# Patient Record
Sex: Female | Born: 1945 | Race: Black or African American | Hispanic: No | Smoking: Never smoker
Health system: Southern US, Community
[De-identification: ages and names within clinical notes are randomized; demographics above are authoritative.]

## PROBLEM LIST (undated history)

## (undated) DIAGNOSIS — I10 Essential (primary) hypertension: Secondary | ICD-10-CM

## (undated) DIAGNOSIS — M170 Bilateral primary osteoarthritis of knee: Secondary | ICD-10-CM

## (undated) DIAGNOSIS — F32A Depression, unspecified: Secondary | ICD-10-CM

## (undated) DIAGNOSIS — I272 Pulmonary hypertension, unspecified: Secondary | ICD-10-CM

## (undated) DIAGNOSIS — M549 Dorsalgia, unspecified: Secondary | ICD-10-CM

## (undated) DIAGNOSIS — K219 Gastro-esophageal reflux disease without esophagitis: Secondary | ICD-10-CM

## (undated) DIAGNOSIS — E785 Hyperlipidemia, unspecified: Secondary | ICD-10-CM

## (undated) DIAGNOSIS — K5730 Diverticulosis of large intestine without perforation or abscess without bleeding: Secondary | ICD-10-CM

## (undated) DIAGNOSIS — G4730 Sleep apnea, unspecified: Secondary | ICD-10-CM

## (undated) DIAGNOSIS — C801 Malignant (primary) neoplasm, unspecified: Secondary | ICD-10-CM

## (undated) DIAGNOSIS — E669 Obesity, unspecified: Secondary | ICD-10-CM

## (undated) DIAGNOSIS — IMO0002 Reserved for concepts with insufficient information to code with codable children: Secondary | ICD-10-CM

## (undated) DIAGNOSIS — M1710 Unilateral primary osteoarthritis, unspecified knee: Secondary | ICD-10-CM

## (undated) DIAGNOSIS — F329 Major depressive disorder, single episode, unspecified: Secondary | ICD-10-CM

## (undated) HISTORY — DX: Depression, unspecified: F32.A

## (undated) HISTORY — DX: Bilateral primary osteoarthritis of knee: M17.0

## (undated) HISTORY — DX: Hyperlipidemia, unspecified: E78.5

## (undated) HISTORY — PX: ABDOMINAL HYSTERECTOMY: SHX81

## (undated) HISTORY — DX: Obesity, unspecified: E66.9

## (undated) HISTORY — PX: TUBAL LIGATION: SHX77

## (undated) HISTORY — DX: Pulmonary hypertension, unspecified: I27.20

## (undated) HISTORY — DX: Dorsalgia, unspecified: M54.9

## (undated) HISTORY — DX: Gastro-esophageal reflux disease without esophagitis: K21.9

## (undated) HISTORY — DX: Major depressive disorder, single episode, unspecified: F32.9

## (undated) HISTORY — DX: Diverticulosis of large intestine without perforation or abscess without bleeding: K57.30

## (undated) HISTORY — DX: Reserved for concepts with insufficient information to code with codable children: IMO0002

## (undated) HISTORY — DX: Essential (primary) hypertension: I10

## (undated) HISTORY — DX: Unilateral primary osteoarthritis, unspecified knee: M17.10

---

## 2001-06-11 ENCOUNTER — Encounter: Payer: Self-pay | Admitting: Internal Medicine

## 2001-06-11 ENCOUNTER — Ambulatory Visit (HOSPITAL_COMMUNITY): Admission: RE | Admit: 2001-06-11 | Discharge: 2001-06-11 | Payer: Self-pay | Admitting: Internal Medicine

## 2002-07-07 ENCOUNTER — Encounter: Payer: Self-pay | Admitting: Internal Medicine

## 2002-07-07 ENCOUNTER — Ambulatory Visit (HOSPITAL_COMMUNITY): Admission: RE | Admit: 2002-07-07 | Discharge: 2002-07-07 | Payer: Self-pay | Admitting: Internal Medicine

## 2003-02-21 ENCOUNTER — Ambulatory Visit: Admission: RE | Admit: 2003-02-21 | Discharge: 2003-02-21 | Payer: Self-pay | Admitting: Orthopedic Surgery

## 2003-02-21 ENCOUNTER — Encounter: Payer: Self-pay | Admitting: Orthopedic Surgery

## 2003-07-11 ENCOUNTER — Ambulatory Visit (HOSPITAL_COMMUNITY): Admission: RE | Admit: 2003-07-11 | Discharge: 2003-07-11 | Payer: Self-pay | Admitting: Family Medicine

## 2003-07-23 HISTORY — PX: COLONOSCOPY: SHX174

## 2003-08-25 ENCOUNTER — Ambulatory Visit (HOSPITAL_COMMUNITY): Admission: RE | Admit: 2003-08-25 | Discharge: 2003-08-25 | Payer: Self-pay | Admitting: General Surgery

## 2003-08-25 LAB — HM COLONOSCOPY: HM Colonoscopy: NORMAL

## 2003-11-28 ENCOUNTER — Ambulatory Visit (HOSPITAL_COMMUNITY): Admission: RE | Admit: 2003-11-28 | Discharge: 2003-11-28 | Payer: Self-pay | Admitting: Family Medicine

## 2003-11-29 ENCOUNTER — Inpatient Hospital Stay (HOSPITAL_COMMUNITY): Admission: RE | Admit: 2003-11-29 | Discharge: 2003-12-05 | Payer: Self-pay | Admitting: Orthopedic Surgery

## 2003-11-29 ENCOUNTER — Encounter: Payer: Self-pay | Admitting: Orthopedic Surgery

## 2003-11-29 HISTORY — PX: TOTAL KNEE ARTHROPLASTY: SHX125

## 2004-04-18 ENCOUNTER — Ambulatory Visit (HOSPITAL_COMMUNITY): Admission: RE | Admit: 2004-04-18 | Discharge: 2004-04-18 | Payer: Self-pay | Admitting: *Deleted

## 2004-06-01 ENCOUNTER — Ambulatory Visit: Payer: Self-pay | Admitting: Orthopedic Surgery

## 2004-06-01 ENCOUNTER — Inpatient Hospital Stay (HOSPITAL_COMMUNITY): Admission: RE | Admit: 2004-06-01 | Discharge: 2004-06-06 | Payer: Self-pay | Admitting: Orthopedic Surgery

## 2004-06-01 HISTORY — PX: TOTAL KNEE ARTHROPLASTY: SHX125

## 2004-06-13 ENCOUNTER — Ambulatory Visit: Payer: Self-pay | Admitting: Orthopedic Surgery

## 2004-06-20 ENCOUNTER — Ambulatory Visit: Payer: Self-pay | Admitting: Family Medicine

## 2004-07-11 ENCOUNTER — Ambulatory Visit: Payer: Self-pay | Admitting: Orthopedic Surgery

## 2004-08-07 ENCOUNTER — Ambulatory Visit (HOSPITAL_COMMUNITY): Admission: RE | Admit: 2004-08-07 | Discharge: 2004-08-07 | Payer: Self-pay | Admitting: Family Medicine

## 2004-08-14 ENCOUNTER — Ambulatory Visit: Payer: Self-pay | Admitting: Family Medicine

## 2004-08-16 ENCOUNTER — Ambulatory Visit: Payer: Self-pay | Admitting: Orthopedic Surgery

## 2004-09-17 ENCOUNTER — Ambulatory Visit: Payer: Self-pay | Admitting: Orthopedic Surgery

## 2004-09-25 ENCOUNTER — Ambulatory Visit: Payer: Self-pay | Admitting: Family Medicine

## 2004-11-22 ENCOUNTER — Ambulatory Visit: Payer: Self-pay | Admitting: Family Medicine

## 2004-12-19 ENCOUNTER — Ambulatory Visit: Payer: Self-pay | Admitting: Orthopedic Surgery

## 2005-03-28 ENCOUNTER — Ambulatory Visit: Payer: Self-pay | Admitting: Family Medicine

## 2005-05-07 ENCOUNTER — Ambulatory Visit: Payer: Self-pay | Admitting: Family Medicine

## 2005-06-03 ENCOUNTER — Ambulatory Visit: Payer: Self-pay | Admitting: Orthopedic Surgery

## 2005-06-03 ENCOUNTER — Ambulatory Visit: Payer: Self-pay | Admitting: Family Medicine

## 2005-06-10 ENCOUNTER — Ambulatory Visit: Payer: Self-pay | Admitting: Family Medicine

## 2005-07-10 ENCOUNTER — Ambulatory Visit: Payer: Self-pay | Admitting: Family Medicine

## 2005-07-19 ENCOUNTER — Emergency Department (HOSPITAL_COMMUNITY): Admission: EM | Admit: 2005-07-19 | Discharge: 2005-07-19 | Payer: Self-pay | Admitting: Emergency Medicine

## 2005-07-19 ENCOUNTER — Ambulatory Visit: Payer: Self-pay | Admitting: Family Medicine

## 2005-07-26 ENCOUNTER — Ambulatory Visit: Payer: Self-pay | Admitting: Family Medicine

## 2005-08-02 ENCOUNTER — Ambulatory Visit: Payer: Self-pay | Admitting: Family Medicine

## 2005-08-15 ENCOUNTER — Ambulatory Visit (HOSPITAL_COMMUNITY): Admission: RE | Admit: 2005-08-15 | Discharge: 2005-08-15 | Payer: Self-pay | Admitting: Family Medicine

## 2005-09-10 ENCOUNTER — Ambulatory Visit: Payer: Self-pay | Admitting: Family Medicine

## 2005-11-06 ENCOUNTER — Ambulatory Visit: Payer: Self-pay | Admitting: Family Medicine

## 2005-12-02 ENCOUNTER — Ambulatory Visit: Payer: Self-pay | Admitting: Family Medicine

## 2006-02-03 ENCOUNTER — Ambulatory Visit: Payer: Self-pay | Admitting: Family Medicine

## 2006-02-04 ENCOUNTER — Ambulatory Visit (HOSPITAL_COMMUNITY): Admission: RE | Admit: 2006-02-04 | Discharge: 2006-02-04 | Payer: Self-pay | Admitting: Family Medicine

## 2006-02-05 ENCOUNTER — Encounter (HOSPITAL_COMMUNITY): Admission: RE | Admit: 2006-02-05 | Discharge: 2006-03-07 | Payer: Self-pay | Admitting: Family Medicine

## 2006-02-10 ENCOUNTER — Ambulatory Visit: Payer: Self-pay | Admitting: Orthopedic Surgery

## 2006-02-14 ENCOUNTER — Ambulatory Visit: Payer: Self-pay | Admitting: Family Medicine

## 2006-02-18 ENCOUNTER — Encounter (HOSPITAL_COMMUNITY): Admission: RE | Admit: 2006-02-18 | Discharge: 2006-03-20 | Payer: Self-pay | Admitting: Orthopedic Surgery

## 2006-02-18 ENCOUNTER — Ambulatory Visit (HOSPITAL_COMMUNITY): Admission: RE | Admit: 2006-02-18 | Discharge: 2006-02-18 | Payer: Self-pay | Admitting: Family Medicine

## 2006-02-20 ENCOUNTER — Ambulatory Visit (HOSPITAL_COMMUNITY): Admission: RE | Admit: 2006-02-20 | Discharge: 2006-02-20 | Payer: Self-pay | Admitting: Orthopedic Surgery

## 2006-03-05 ENCOUNTER — Ambulatory Visit: Payer: Self-pay | Admitting: Orthopedic Surgery

## 2006-03-21 ENCOUNTER — Ambulatory Visit: Payer: Self-pay | Admitting: Family Medicine

## 2006-03-25 ENCOUNTER — Encounter (HOSPITAL_COMMUNITY): Admission: RE | Admit: 2006-03-25 | Discharge: 2006-04-19 | Payer: Self-pay | Admitting: Orthopedic Surgery

## 2006-04-02 ENCOUNTER — Ambulatory Visit: Payer: Self-pay | Admitting: Orthopedic Surgery

## 2006-04-25 ENCOUNTER — Ambulatory Visit: Payer: Self-pay | Admitting: Family Medicine

## 2006-04-25 ENCOUNTER — Other Ambulatory Visit: Admission: RE | Admit: 2006-04-25 | Discharge: 2006-04-25 | Payer: Self-pay | Admitting: Family Medicine

## 2006-04-25 LAB — CONVERTED CEMR LAB: Pap Smear: NORMAL

## 2006-04-28 ENCOUNTER — Ambulatory Visit: Payer: Self-pay | Admitting: Orthopedic Surgery

## 2006-06-27 ENCOUNTER — Ambulatory Visit: Payer: Self-pay | Admitting: Family Medicine

## 2006-08-19 ENCOUNTER — Ambulatory Visit (HOSPITAL_COMMUNITY): Admission: RE | Admit: 2006-08-19 | Discharge: 2006-08-19 | Payer: Self-pay | Admitting: Family Medicine

## 2006-09-29 ENCOUNTER — Ambulatory Visit: Payer: Self-pay | Admitting: Family Medicine

## 2006-09-29 LAB — CONVERTED CEMR LAB
BUN: 11 mg/dL (ref 6–23)
Calcium: 9.3 mg/dL (ref 8.4–10.5)
Cholesterol: 227 mg/dL — ABNORMAL HIGH (ref 0–200)
Creatinine, Ser: 0.74 mg/dL (ref 0.40–1.20)
Glucose, Bld: 76 mg/dL (ref 70–99)
LDL Cholesterol: 134 mg/dL — ABNORMAL HIGH (ref 0–99)
Potassium: 4.3 meq/L (ref 3.5–5.3)
Sodium: 143 meq/L (ref 135–145)
Total Bilirubin: 0.4 mg/dL (ref 0.3–1.2)
Total CHOL/HDL Ratio: 2.9
VLDL: 15 mg/dL (ref 0–40)

## 2006-11-10 ENCOUNTER — Ambulatory Visit: Payer: Self-pay | Admitting: Family Medicine

## 2006-11-11 ENCOUNTER — Encounter: Payer: Self-pay | Admitting: Family Medicine

## 2006-11-11 LAB — CONVERTED CEMR LAB: Microalb, Ur: 0.78 mg/dL (ref 0.00–1.89)

## 2006-11-19 ENCOUNTER — Ambulatory Visit (HOSPITAL_COMMUNITY): Admission: RE | Admit: 2006-11-19 | Discharge: 2006-11-19 | Payer: Self-pay | Admitting: Family Medicine

## 2006-11-20 ENCOUNTER — Ambulatory Visit (HOSPITAL_COMMUNITY): Admission: RE | Admit: 2006-11-20 | Discharge: 2006-11-20 | Payer: Self-pay | Admitting: Pulmonary Disease

## 2006-12-16 ENCOUNTER — Ambulatory Visit: Payer: Self-pay | Admitting: Family Medicine

## 2007-01-05 ENCOUNTER — Encounter: Payer: Self-pay | Admitting: Family Medicine

## 2007-01-05 LAB — CONVERTED CEMR LAB
ALT: 9 U/L
AST: 13 U/L
Albumin: 4.2 g/dL
Alkaline Phosphatase: 66 U/L
Bilirubin, Direct: 0.1 mg/dL
Cholesterol: 207 mg/dL — ABNORMAL HIGH
HDL: 87 mg/dL
Indirect Bilirubin: 0.4 mg/dL
LDL Cholesterol: 106 mg/dL — ABNORMAL HIGH
Total Bilirubin: 0.5 mg/dL
Total CHOL/HDL Ratio: 2.4
Total Protein: 7.3 g/dL
Triglycerides: 71 mg/dL
VLDL: 14 mg/dL

## 2007-04-08 ENCOUNTER — Ambulatory Visit: Payer: Self-pay | Admitting: Family Medicine

## 2007-04-14 ENCOUNTER — Ambulatory Visit (HOSPITAL_COMMUNITY): Admission: RE | Admit: 2007-04-14 | Discharge: 2007-04-14 | Payer: Self-pay | Admitting: Family Medicine

## 2007-04-27 ENCOUNTER — Ambulatory Visit: Payer: Self-pay | Admitting: Orthopedic Surgery

## 2007-05-19 ENCOUNTER — Ambulatory Visit: Payer: Self-pay | Admitting: Family Medicine

## 2007-06-30 ENCOUNTER — Encounter: Payer: Self-pay | Admitting: Family Medicine

## 2007-06-30 LAB — CONVERTED CEMR LAB
ALT: <8 units/L (ref 0–35)
Albumin: 4 g/dL (ref 3.5–5.2)
BUN: 13 mg/dL (ref 6–23)
Basophils Relative: 0 % (ref 0–1)
CO2: 26 meq/L (ref 19–32)
Creatinine, Ser: 0.93 mg/dL (ref 0.40–1.20)
Glucose, Bld: 114 mg/dL — ABNORMAL HIGH (ref 70–99)
HCT: 42.7 % (ref 36.0–46.0)
HDL: 83 mg/dL (ref >39)
Hemoglobin: 13.4 g/dL (ref 12.0–15.0)
Indirect Bilirubin: 0.4 mg/dL (ref 0.0–0.9)
Lymphs Abs: 3.3 10*3/uL (ref 0.7–4.0)
MCHC: 31.4 g/dL (ref 30.0–36.0)
Monocytes Absolute: 0.5 10*3/uL (ref 0.1–1.0)
Monocytes Relative: 6 % (ref 3–12)
Neutro Abs: 5.3 10*3/uL (ref 1.7–7.7)
Neutrophils Relative %: 57 % (ref 43–77)
Platelets: 188 10*3/uL (ref 150–400)
RBC: 4.61 M/uL (ref 3.87–5.11)
Triglycerides: 44 mg/dL (ref <150)

## 2007-07-08 ENCOUNTER — Ambulatory Visit: Payer: Self-pay | Admitting: Family Medicine

## 2007-08-05 ENCOUNTER — Ambulatory Visit (HOSPITAL_COMMUNITY): Admission: RE | Admit: 2007-08-05 | Discharge: 2007-08-05 | Payer: Self-pay | Admitting: Pulmonary Disease

## 2007-08-21 ENCOUNTER — Ambulatory Visit (HOSPITAL_COMMUNITY): Admission: RE | Admit: 2007-08-21 | Discharge: 2007-08-21 | Payer: Self-pay | Admitting: Family Medicine

## 2007-10-06 ENCOUNTER — Ambulatory Visit: Payer: Self-pay | Admitting: Family Medicine

## 2007-10-16 ENCOUNTER — Encounter: Payer: Self-pay | Admitting: Family Medicine

## 2007-10-16 DIAGNOSIS — K5730 Diverticulosis of large intestine without perforation or abscess without bleeding: Secondary | ICD-10-CM | POA: Insufficient documentation

## 2007-10-16 DIAGNOSIS — I10 Essential (primary) hypertension: Secondary | ICD-10-CM | POA: Insufficient documentation

## 2007-10-16 DIAGNOSIS — F419 Anxiety disorder, unspecified: Secondary | ICD-10-CM

## 2007-10-16 DIAGNOSIS — R7302 Impaired glucose tolerance (oral): Secondary | ICD-10-CM

## 2007-10-16 DIAGNOSIS — I272 Pulmonary hypertension, unspecified: Secondary | ICD-10-CM | POA: Insufficient documentation

## 2007-10-16 DIAGNOSIS — F329 Major depressive disorder, single episode, unspecified: Secondary | ICD-10-CM

## 2007-10-16 DIAGNOSIS — K219 Gastro-esophageal reflux disease without esophagitis: Secondary | ICD-10-CM | POA: Insufficient documentation

## 2007-10-16 DIAGNOSIS — M1710 Unilateral primary osteoarthritis, unspecified knee: Secondary | ICD-10-CM | POA: Insufficient documentation

## 2007-10-16 DIAGNOSIS — E785 Hyperlipidemia, unspecified: Secondary | ICD-10-CM | POA: Insufficient documentation

## 2007-10-16 DIAGNOSIS — J45909 Unspecified asthma, uncomplicated: Secondary | ICD-10-CM | POA: Insufficient documentation

## 2008-01-06 ENCOUNTER — Ambulatory Visit: Payer: Self-pay | Admitting: Family Medicine

## 2008-01-06 LAB — CONVERTED CEMR LAB: Microalb, Ur: 1.84 mg/dL (ref 0.00–1.89)

## 2008-01-08 ENCOUNTER — Encounter: Payer: Self-pay | Admitting: Family Medicine

## 2008-01-08 LAB — CONVERTED CEMR LAB
ALT: 8 units/L (ref 0–35)
AST: 12 units/L (ref 0–37)
Albumin: 4 g/dL (ref 3.5–5.2)
Alkaline Phosphatase: 79 units/L (ref 39–117)
Indirect Bilirubin: 0.4 mg/dL (ref 0.0–0.9)
LDL Cholesterol: 133 mg/dL — ABNORMAL HIGH (ref 0–99)
Total Bilirubin: 0.5 mg/dL (ref 0.3–1.2)
Total CHOL/HDL Ratio: 2.7

## 2008-02-15 ENCOUNTER — Telehealth: Payer: Self-pay | Admitting: Family Medicine

## 2008-02-19 ENCOUNTER — Ambulatory Visit: Payer: Self-pay | Admitting: Family Medicine

## 2008-02-19 ENCOUNTER — Encounter: Payer: Self-pay | Admitting: Family Medicine

## 2008-04-06 ENCOUNTER — Ambulatory Visit: Payer: Self-pay | Admitting: Family Medicine

## 2008-04-06 DIAGNOSIS — R0989 Other specified symptoms and signs involving the circulatory and respiratory systems: Secondary | ICD-10-CM | POA: Insufficient documentation

## 2008-04-06 LAB — CONVERTED CEMR LAB
Glucose, Bld: 103 mg/dL
Hgb A1c MFr Bld: 5.9 %

## 2008-04-07 ENCOUNTER — Ambulatory Visit (HOSPITAL_COMMUNITY): Admission: RE | Admit: 2008-04-07 | Discharge: 2008-04-07 | Payer: Self-pay | Admitting: Family Medicine

## 2008-04-29 ENCOUNTER — Encounter: Payer: Self-pay | Admitting: Family Medicine

## 2008-04-29 LAB — CONVERTED CEMR LAB
Alkaline Phosphatase: 63 units/L (ref 39–117)
Indirect Bilirubin: 0.4 mg/dL (ref 0.0–0.9)
LDL Cholesterol: 55 mg/dL (ref 0–99)
Total CHOL/HDL Ratio: 1.9
Total Protein: 6.9 g/dL (ref 6.0–8.3)

## 2008-05-23 ENCOUNTER — Ambulatory Visit: Payer: Self-pay | Admitting: Family Medicine

## 2008-06-06 ENCOUNTER — Ambulatory Visit: Payer: Self-pay | Admitting: Family Medicine

## 2008-06-06 ENCOUNTER — Encounter: Payer: Self-pay | Admitting: Family Medicine

## 2008-06-06 ENCOUNTER — Other Ambulatory Visit: Admission: RE | Admit: 2008-06-06 | Discharge: 2008-06-06 | Payer: Self-pay | Admitting: Family Medicine

## 2008-07-27 ENCOUNTER — Encounter: Payer: Self-pay | Admitting: Family Medicine

## 2008-08-11 ENCOUNTER — Ambulatory Visit: Payer: Self-pay | Admitting: Family Medicine

## 2008-08-11 LAB — CONVERTED CEMR LAB
Glucose, Bld: 150 mg/dL
Hgb A1c MFr Bld: 5.6 %

## 2008-08-22 ENCOUNTER — Ambulatory Visit (HOSPITAL_COMMUNITY): Admission: RE | Admit: 2008-08-22 | Discharge: 2008-08-22 | Payer: Self-pay | Admitting: Family Medicine

## 2008-08-30 ENCOUNTER — Encounter: Payer: Self-pay | Admitting: Family Medicine

## 2008-11-04 ENCOUNTER — Encounter: Payer: Self-pay | Admitting: Family Medicine

## 2008-11-04 LAB — CONVERTED CEMR LAB
AST: 13 units/L (ref 0–37)
Albumin: 3.5 g/dL (ref 3.5–5.2)
Alkaline Phosphatase: 69 units/L (ref 39–117)
CO2: 26 meq/L (ref 19–32)
Calcium: 9.3 mg/dL (ref 8.4–10.5)
Chloride: 106 meq/L (ref 96–112)
Creatinine, Ser: 0.93 mg/dL (ref 0.40–1.20)
Eosinophils Absolute: 0.4 10*3/uL (ref 0.0–0.7)
HDL: 56 mg/dL (ref >39)
LDL Cholesterol: 73 mg/dL (ref 0–99)
Lymphs Abs: 4.3 10*3/uL — ABNORMAL HIGH (ref 0.7–4.0)
MCV: 87.2 fL (ref 78.0–100.0)
Monocytes Relative: 7 % (ref 3–12)
Neutro Abs: 6.6 10*3/uL (ref 1.7–7.7)
Neutrophils Relative %: 54 % (ref 43–77)
Platelets: 234 10*3/uL (ref 150–400)
RBC: 4.29 M/uL (ref 3.87–5.11)
Sodium: 142 meq/L (ref 135–145)
TSH: 1.291 microintl units/mL (ref 0.350–4.500)
Total CHOL/HDL Ratio: 2.5
Total Protein: 6.6 g/dL (ref 6.0–8.3)
Triglycerides: 59 mg/dL (ref <150)
WBC: 12.2 10*3/uL — ABNORMAL HIGH (ref 4.0–10.5)

## 2008-11-09 ENCOUNTER — Ambulatory Visit: Payer: Self-pay | Admitting: Family Medicine

## 2008-11-09 DIAGNOSIS — J019 Acute sinusitis, unspecified: Secondary | ICD-10-CM

## 2008-11-16 ENCOUNTER — Telehealth: Payer: Self-pay | Admitting: Family Medicine

## 2008-11-24 ENCOUNTER — Encounter: Payer: Self-pay | Admitting: Family Medicine

## 2009-02-03 ENCOUNTER — Encounter: Payer: Self-pay | Admitting: Family Medicine

## 2009-03-13 LAB — CONVERTED CEMR LAB
ALT: <8 units/L (ref 0–35)
BUN: 14 mg/dL (ref 6–23)
Bilirubin, Direct: 0.1 mg/dL (ref 0.0–0.3)
CO2: 25 meq/L (ref 19–32)
Glucose, Bld: 91 mg/dL (ref 70–99)
LDL Cholesterol: 84 mg/dL (ref 0–99)
Potassium: 4.5 meq/L (ref 3.5–5.3)
Sodium: 144 meq/L (ref 135–145)
Total Bilirubin: 0.3 mg/dL (ref 0.3–1.2)
Total CHOL/HDL Ratio: 2.3
VLDL: 10 mg/dL (ref 0–40)

## 2009-03-20 ENCOUNTER — Ambulatory Visit: Payer: Self-pay | Admitting: Family Medicine

## 2009-03-20 LAB — CONVERTED CEMR LAB
Glucose, Bld: 97 mg/dL
Hgb A1c MFr Bld: 6.1 %

## 2009-05-01 ENCOUNTER — Ambulatory Visit: Payer: Self-pay | Admitting: Family Medicine

## 2009-05-01 LAB — CONVERTED CEMR LAB: Glucose, Bld: 103 mg/dL

## 2009-06-01 ENCOUNTER — Ambulatory Visit (HOSPITAL_COMMUNITY): Payer: Self-pay | Admitting: Psychology

## 2009-06-08 ENCOUNTER — Ambulatory Visit (HOSPITAL_COMMUNITY): Payer: Self-pay | Admitting: Psychiatry

## 2009-06-12 ENCOUNTER — Telehealth: Payer: Self-pay | Admitting: Family Medicine

## 2009-06-20 ENCOUNTER — Ambulatory Visit (HOSPITAL_COMMUNITY): Payer: Self-pay | Admitting: Psychiatry

## 2009-06-27 ENCOUNTER — Telehealth: Payer: Self-pay | Admitting: Family Medicine

## 2009-06-30 LAB — CONVERTED CEMR LAB
ALT: 9 units/L (ref 0–35)
Albumin: 4 g/dL (ref 3.5–5.2)
BUN: 21 mg/dL (ref 6–23)
Cholesterol: 167 mg/dL (ref 0–200)
HDL: 75 mg/dL (ref >39)
Indirect Bilirubin: 0.4 mg/dL (ref 0.0–0.9)
Potassium: 4.7 meq/L (ref 3.5–5.3)
Sodium: 143 meq/L (ref 135–145)
Total Protein: 6.8 g/dL (ref 6.0–8.3)
Triglycerides: 52 mg/dL (ref <150)
VLDL: 10 mg/dL (ref 0–40)

## 2009-07-03 ENCOUNTER — Ambulatory Visit (HOSPITAL_COMMUNITY): Payer: Self-pay | Admitting: Psychiatry

## 2009-07-03 ENCOUNTER — Ambulatory Visit: Payer: Self-pay | Admitting: Family Medicine

## 2009-07-03 LAB — CONVERTED CEMR LAB: Glucose, Bld: 193 mg/dL

## 2009-07-17 ENCOUNTER — Ambulatory Visit (HOSPITAL_COMMUNITY): Payer: Self-pay | Admitting: Psychiatry

## 2009-07-25 ENCOUNTER — Encounter: Payer: Self-pay | Admitting: Family Medicine

## 2009-07-31 ENCOUNTER — Ambulatory Visit (HOSPITAL_COMMUNITY): Payer: Self-pay | Admitting: Psychiatry

## 2009-08-22 ENCOUNTER — Ambulatory Visit (HOSPITAL_COMMUNITY): Payer: Self-pay | Admitting: Psychiatry

## 2009-09-08 ENCOUNTER — Ambulatory Visit (HOSPITAL_COMMUNITY): Admission: RE | Admit: 2009-09-08 | Discharge: 2009-09-08 | Payer: Self-pay | Admitting: Family Medicine

## 2009-09-11 ENCOUNTER — Ambulatory Visit (HOSPITAL_COMMUNITY): Payer: Self-pay | Admitting: Psychiatry

## 2009-09-25 ENCOUNTER — Ambulatory Visit (HOSPITAL_COMMUNITY): Payer: Self-pay | Admitting: Psychiatry

## 2009-10-09 ENCOUNTER — Ambulatory Visit (HOSPITAL_COMMUNITY): Payer: Self-pay | Admitting: Psychiatry

## 2009-10-23 ENCOUNTER — Ambulatory Visit (HOSPITAL_COMMUNITY): Payer: Self-pay | Admitting: Psychiatry

## 2009-11-06 ENCOUNTER — Ambulatory Visit (HOSPITAL_COMMUNITY): Payer: Self-pay | Admitting: Psychiatry

## 2009-11-07 ENCOUNTER — Ambulatory Visit: Payer: Self-pay | Admitting: Family Medicine

## 2009-11-07 DIAGNOSIS — M25569 Pain in unspecified knee: Secondary | ICD-10-CM

## 2009-11-07 LAB — CONVERTED CEMR LAB
Nitrite: NEGATIVE
Protein, U semiquant: NEGATIVE
pH: 5.5

## 2009-11-08 ENCOUNTER — Encounter: Payer: Self-pay | Admitting: Family Medicine

## 2009-11-08 LAB — CONVERTED CEMR LAB
ALT: <8 units/L (ref 0–35)
AST: 10 units/L (ref 0–37)
BUN: 11 mg/dL (ref 6–23)
Basophils Absolute: 0 10*3/uL (ref 0.0–0.1)
Basophils Relative: 0 % (ref 0–1)
Bilirubin, Direct: 0.1 mg/dL (ref 0.0–0.3)
Calcium: 9 mg/dL (ref 8.4–10.5)
Cholesterol: 218 mg/dL — ABNORMAL HIGH (ref 0–200)
Eosinophils Relative: 2 % (ref 0–5)
Glucose, Bld: 94 mg/dL (ref 70–99)
HCT: 40.8 % (ref 36.0–46.0)
Hemoglobin: 13.1 g/dL (ref 12.0–15.0)
Hgb A1c MFr Bld: 6 % — ABNORMAL HIGH (ref <5.7)
Indirect Bilirubin: 0.6 mg/dL (ref 0.0–0.9)
MCHC: 32.1 g/dL (ref 30.0–36.0)
Monocytes Absolute: 0.9 10*3/uL (ref 0.1–1.0)
RDW: 14.3 % (ref 11.5–15.5)
VLDL: 12 mg/dL (ref 0–40)

## 2009-11-20 ENCOUNTER — Ambulatory Visit (HOSPITAL_COMMUNITY): Payer: Self-pay | Admitting: Psychiatry

## 2009-11-28 ENCOUNTER — Encounter: Payer: Self-pay | Admitting: Family Medicine

## 2009-12-04 ENCOUNTER — Ambulatory Visit (HOSPITAL_COMMUNITY): Payer: Self-pay | Admitting: Psychiatry

## 2009-12-19 ENCOUNTER — Ambulatory Visit (HOSPITAL_COMMUNITY): Payer: Self-pay | Admitting: Psychiatry

## 2010-01-02 ENCOUNTER — Ambulatory Visit (HOSPITAL_COMMUNITY): Payer: Self-pay | Admitting: Psychiatry

## 2010-01-24 ENCOUNTER — Encounter: Payer: Self-pay | Admitting: Family Medicine

## 2010-01-24 ENCOUNTER — Ambulatory Visit (HOSPITAL_COMMUNITY): Payer: Self-pay | Admitting: Psychiatry

## 2010-02-01 ENCOUNTER — Ambulatory Visit (HOSPITAL_COMMUNITY): Payer: Self-pay | Admitting: Psychiatry

## 2010-02-15 ENCOUNTER — Ambulatory Visit (HOSPITAL_COMMUNITY): Payer: Self-pay | Admitting: Psychiatry

## 2010-03-02 ENCOUNTER — Ambulatory Visit (HOSPITAL_COMMUNITY): Payer: Self-pay | Admitting: Psychiatry

## 2010-03-13 ENCOUNTER — Ambulatory Visit: Payer: Self-pay | Admitting: Family Medicine

## 2010-03-13 ENCOUNTER — Ambulatory Visit (HOSPITAL_COMMUNITY): Admission: RE | Admit: 2010-03-13 | Discharge: 2010-03-13 | Payer: Self-pay | Admitting: Family Medicine

## 2010-03-13 DIAGNOSIS — M79609 Pain in unspecified limb: Secondary | ICD-10-CM | POA: Insufficient documentation

## 2010-03-13 LAB — HM DIABETES FOOT EXAM

## 2010-03-14 ENCOUNTER — Encounter: Payer: Self-pay | Admitting: Family Medicine

## 2010-03-14 LAB — CONVERTED CEMR LAB
Albumin: 4.2 g/dL (ref 3.5–5.2)
BUN: 12 mg/dL (ref 6–23)
Chloride: 105 meq/L (ref 96–112)
Creatinine, Urine: 174.8 mg/dL
Hgb A1c MFr Bld: 5.8 % — ABNORMAL HIGH (ref <5.7)
Indirect Bilirubin: 0.5 mg/dL (ref 0.0–0.9)
LDL Cholesterol: 130 mg/dL — ABNORMAL HIGH (ref 0–99)
Microalb, Ur: 0.76 mg/dL (ref 0.00–1.89)
Potassium: 3.9 meq/L (ref 3.5–5.3)
Total CHOL/HDL Ratio: 2.6
Total Protein: 7 g/dL (ref 6.0–8.3)
Triglycerides: 63 mg/dL (ref <150)
VLDL: 13 mg/dL (ref 0–40)

## 2010-03-15 ENCOUNTER — Telehealth: Payer: Self-pay | Admitting: Family Medicine

## 2010-03-16 ENCOUNTER — Ambulatory Visit (HOSPITAL_COMMUNITY): Payer: Self-pay | Admitting: Psychiatry

## 2010-04-03 ENCOUNTER — Ambulatory Visit: Payer: Self-pay | Admitting: Family Medicine

## 2010-04-03 ENCOUNTER — Ambulatory Visit (HOSPITAL_COMMUNITY): Payer: Self-pay | Admitting: Psychiatry

## 2010-04-24 ENCOUNTER — Ambulatory Visit (HOSPITAL_COMMUNITY): Payer: Self-pay | Admitting: Psychiatry

## 2010-05-14 ENCOUNTER — Ambulatory Visit (HOSPITAL_COMMUNITY): Payer: Self-pay | Admitting: Psychiatry

## 2010-05-14 ENCOUNTER — Telehealth: Payer: Self-pay | Admitting: Family Medicine

## 2010-06-04 ENCOUNTER — Ambulatory Visit (HOSPITAL_COMMUNITY): Payer: Self-pay | Admitting: Psychiatry

## 2010-06-12 ENCOUNTER — Ambulatory Visit: Payer: Self-pay | Admitting: Family Medicine

## 2010-06-12 LAB — CONVERTED CEMR LAB
Alkaline Phosphatase: 59 units/L (ref 39–117)
Bilirubin, Direct: 0.1 mg/dL (ref 0.0–0.3)
Indirect Bilirubin: 0.5 mg/dL (ref 0.0–0.9)
LDL Cholesterol: 91 mg/dL (ref 0–99)
Total Bilirubin: 0.6 mg/dL (ref 0.3–1.2)
Triglycerides: 44 mg/dL (ref <150)

## 2010-06-28 ENCOUNTER — Ambulatory Visit (HOSPITAL_COMMUNITY): Payer: Self-pay | Admitting: Psychiatry

## 2010-07-02 ENCOUNTER — Ambulatory Visit (HOSPITAL_COMMUNITY): Payer: Self-pay | Admitting: Psychiatry

## 2010-07-17 ENCOUNTER — Ambulatory Visit (HOSPITAL_COMMUNITY): Payer: Self-pay | Admitting: Psychiatry

## 2010-07-25 ENCOUNTER — Encounter: Payer: Self-pay | Admitting: Family Medicine

## 2010-07-31 ENCOUNTER — Ambulatory Visit (HOSPITAL_COMMUNITY)
Admission: RE | Admit: 2010-07-31 | Discharge: 2010-07-31 | Payer: Self-pay | Source: Home / Self Care | Attending: Psychiatry | Admitting: Psychiatry

## 2010-08-12 ENCOUNTER — Encounter: Payer: Self-pay | Admitting: Family Medicine

## 2010-08-15 ENCOUNTER — Ambulatory Visit (HOSPITAL_COMMUNITY)
Admission: RE | Admit: 2010-08-15 | Discharge: 2010-08-15 | Payer: Self-pay | Source: Home / Self Care | Attending: Psychiatry | Admitting: Psychiatry

## 2010-08-21 NOTE — Assessment & Plan Note (Signed)
Summary: flu shot  Nurse Visit   Allergies: No Known Drug Allergies  Immunizations Administered:  Influenza Vaccine # 1:    Vaccine Type: Fluvax MCR    Site: right deltoid    Mfr: novartis    Dose: 0.5 ml    Route: IM    Given by: Adella Hare LPN    Exp. Date: 11/2010    Lot #: [id number]    VIS given: 02/13/10 version given April 03, 2010.  Orders Added: 1)  Influenza Vaccine MCR [00025]

## 2010-08-21 NOTE — Progress Notes (Signed)
  Phone Note Call from Patient   Caller: Patient Summary of Call: patient states she has no voice, no other symptoms and doesn feel bad, just has no voice what can she do for this? Initial call taken by: Adella Hare LPN,  May 14, 2010 12:03 PM  Follow-up for Phone Call        VOICE REST AND SALT WATER GARGLES 3 TIMES DAILY, SHOULD RETURN IN 3 TO 5 DAYS Follow-up by: Syliva Overman MD,  May 14, 2010 12:05 PM  Additional Follow-up for Phone Call Additional follow up Details #1::        patient aware Additional Follow-up by: Mauricia Area CMA,  May 14, 2010 12:48 PM

## 2010-08-21 NOTE — Assessment & Plan Note (Signed)
Summary: F UP   Vital Signs:  Patient profile:   65 year old female Menstrual status:  hysterectomy Height:      60 inches Weight:      183.75 pounds BMI:     36.02 O2 Sat:      99 % on Room air Pulse rate:   81 / minute Pulse rhythm:   regular Resp:     16 per minute BP sitting:   140 / 100  (left arm)  Vitals Entered By: Adella Hare LPN (November 07, 2009 11:51 AM)  Nutrition Counseling: Patient's BMI is greater than 25 and therefore counseled on weight management options.  O2 Flow:  Room air CC: follow up Is Patient Diabetic? Yes Did you bring your meter with you today? No Pain Assessment Patient in pain? yes     Location: left knee Intensity: 10 Type: aching Onset of pain  Intermittent   Primary Care Provider:  Syliva Overman MD  CC:  follow up.  History of Present Illness: Reports  that she has been  doing well. Denies recent fever or chills. Denies sinus pressure, nasal congestion , ear pain or sore throat. Denies chest congestion, or cough productive of sputum. Denies chest pain, palpitations, PND, orthopnea or leg swelling. Denies abdominal pain, nausea, vomitting, diarrhea or constipation. Denies change in bowel movements or bloody stool. Denies dysuria , frequency, incontinence or hesitancy. c/o back , hip and knee pain with reduced mobility. Denies headaches, vertigo, seizures. Denies depression, anxiety or insomnia.These are controlled with chronic meds. Denies  rash, lesions, or itch.     Current Medications (verified): 1)  Astelin 137 Mcg/spray  Soln (Azelastine Hcl) .... Two Sprays Each Nostril Twice Daily 2)  Albuterol 90 Mcg/act  Aers (Albuterol) .... Two Puffs As Needed 3)  Vytorin 10-80 Mg  Tabs (Ezetimibe-Simvastatin) .... Take 1 Tab By Mouth At Bedtime 4)  Flonase 50 Mcg/act  Susp (Fluticasone Propionate) .... One Spray Each Nostril Once Daily 5)  Cyclobenzaprine Hcl 5 Mg Tabs (Cyclobenzaprine Hcl) .... Take 1 Tab By Mouth At Bedtime 6)   Tricor 145 Mg  Tabs (Fenofibrate) .... Take 1 Tab By Mouth At Bedtime 7)  Vitamin C 500 Mg  Tabs (Ascorbic Acid) .... Take 1 Tablet By Mouth Once A Day 8)  Aspirin 81 Mg  Tbec (Aspirin) .... Take 1 Tablet By Mouth Once A Day 9)  Prevacid 30 Mg  Cpdr (Lansoprazole) .... Take 1 Tablet By Mouth Once A Day 10)  Sertraline Hcl 100 Mg  Tabs (Sertraline Hcl) .... Take 1 Tablet By Mouth Once A Day 11)  Trazodone Hcl 100 Mg  Tabs (Trazodone Hcl) .... Take 1 Tablet By Mouth Two Times A Day 12)  Metformin Hcl 500 Mg  Tabs (Metformin Hcl) .... Take 1 Tablet By Mouth Once A Day 13)  Klor-Con M20 20 Meq  Tbcr (Potassium Chloride Crys Cr) .... Take 1 Tablet By Mouth Once A Day 14)  Niaspan 500 Mg  Tbcr (Niacin (Antihyperlipidemic)) .... Take Two Tablets By Mouth At Bedtime 15)  Revatio 20 Mg  Tabs (Sildenafil Citrate) .... .tidtab 16)  Effexor Xr 75 Mg  Cp24 (Venlafaxine Hcl) .... Take 1 Tablet By Mouth Two Times A Day 17)  Calcium 600+d 600-200 Mg-Unit  Tabs (Calcium Carbonate-Vitamin D) .... Take 1 Tablet By Mouth Three Times A Day 18)  Metoprolol Succinate 50 Mg Xr24h-Tab (Metoprolol Succinate) .... One Tab By Mouth Qd 19)  Losartan Potassium-Hctz 100-12.5 Mg Tabs (Losartan Potassium-Hctz) .... Take 1  Tablet By Mouth Once A Day  Allergies (verified): No Known Drug Allergies  Review of Systems      See HPI Eyes:  Denies blurring and discharge. GI:  Complains of abdominal pain; lower abd cramping pain with loosesstool yesterday, now resolved. GU:  Complains of dysuria; denies urinary frequency; lower abd pressure since last night. MS:  Complains of joint pain, low back pain, and stiffness; left knee pain and stiffness since last night, no inciting trauma, intermittent low nack pain, non radiating. Endo:  Denies cold intolerance, excessive hunger, excessive thirst, excessive urination, heat intolerance, polyuria, and weight change; fasting sugars less than 110. Heme:  Denies abnormal bruising and  bleeding. Allergy:  Complains of seasonal allergies; denies hives or rash and itching eyes.  Physical Exam  General:  Well-developed,obese,in no acute distress; alert,appropriate and cooperative throughout examination. HEENT: No facial asymmetry,  EOMI, No sinus tenderness, TM's Clear, oropharynx  pink and moist.   Chest: Clear to auscultation bilaterally.  CVS: S1, S2, No murmurs, No S3.   Abd: Soft, Nontender.  MS: Adequate ROM spine, hips, shoulders and knees.  Ext: No edema.   CNS: CN 2-12 intact, power tone and sensation normal throughout.   Skin: Intact, no visible lesions or rashes.  Psych: Good eye contact, normal affect.  Memory intact, not anxious or depressed appearing.   Diabetes Management Exam:    Foot Exam (with socks and/or shoes not present):       Sensory-Monofilament:          Left foot: diminished          Right foot: diminished       Inspection:          Left foot: normal          Right foot: normal       Nails:          Left foot: normal          Right foot: normal   Impression & Recommendations:  Problem # 1:  KNEE PAIN, LEFT (ICD-719.46) Assessment Deteriorated  Her updated medication list for this problem includes:    Cyclobenzaprine Hcl 5 Mg Tabs (Cyclobenzaprine hcl) .Marland Kitchen... Take 1 tab by mouth at bedtime    Aspirin 81 Mg Tbec (Aspirin) .Marland Kitchen... Take 1 tablet by mouth once a day  Orders: Depo- Medrol 80mg  (J1040) Ketorolac-Toradol 15mg  (G2952) Admin of Therapeutic Inj  intramuscular or subcutaneous (84132)  Problem # 2:  HYPERLIPIDEMIA (ICD-272.4) Assessment: Unchanged  Her updated medication list for this problem includes:    Vytorin 10-80 Mg Tabs (Ezetimibe-simvastatin) .Marland Kitchen... Take 1 tab by mouth at bedtime    Tricor 145 Mg Tabs (Fenofibrate) .Marland Kitchen... Take 1 tab by mouth at bedtime    Niaspan 500 Mg Tbcr (Niacin (antihyperlipidemic)) .Marland Kitchen... Take two tablets by mouth at bedtime  Orders: T-Hepatic Function [phone] T-Lipid Profile  [phone])  Labs Reviewed: SGOT: 13 (06/30/2009)   SGPT: 9 (06/30/2009)   HDL:75 (06/30/2009), 74 (03/13/2009)  LDL:82 (06/30/2009), 84 (03/13/2009)  Chol:167 (06/30/2009), 168 (03/13/2009)  Trig:52 (06/30/2009), 49 (03/13/2009)  Problem # 3:  DIABETES MELLITUS, TYPE II (ICD-250.00) Assessment: Comment Only  Her updated medication list for this problem includes:    Aspirin 81 Mg Tbec (Aspirin) .Marland Kitchen... Take 1 tablet by mouth once a day    Metformin Hcl 500 Mg Tabs (Metformin hcl) .Marland Kitchen... Take 1 tablet by mouth once a day    Losartan Potassium-hctz 100-12.5 Mg Tabs (Losartan potassium-hctz) .Marland Kitchen... Take 1 tablet by mouth once  a day  Orders: T- Hemoglobin A1C ([id number]) Ophthalmology Referral (Ophthalmology)  Labs Reviewed: Creat: 1.38 (06/30/2009)    Reviewed HgBA1c results: 5.8 (07/03/2009)  6.1 (03/20/2009)  Problem # 4:  DEPRESSION (ICD-311) Assessment: Improved  Her updated medication list for this problem includes:    Sertraline Hcl 100 Mg Tabs (Sertraline hcl) .Marland Kitchen... Take 1 tablet by mouth once a day    Trazodone Hcl 100 Mg Tabs (Trazodone hcl) .Marland Kitchen... Take 1 tablet by mouth two times a day    Effexor Xr 75 Mg Cp24 (Venlafaxine hcl) .Marland Kitchen... Take 1 tablet by mouth two times a day  Complete Medication List: 1)  Astelin 137 Mcg/spray Soln (Azelastine hcl) .... Two sprays each nostril twice daily 2)  Albuterol 90 Mcg/act Aers (Albuterol) .... Two puffs as needed 3)  Vytorin 10-80 Mg Tabs (Ezetimibe-simvastatin) .... Take 1 tab by mouth at bedtime 4)  Flonase 50 Mcg/act Susp (Fluticasone propionate) .... One spray each nostril once daily 5)  Cyclobenzaprine Hcl 5 Mg Tabs (Cyclobenzaprine hcl) .... Take 1 tab by mouth at bedtime 6)  Tricor 145 Mg Tabs (Fenofibrate) .... Take 1 tab by mouth at bedtime 7)  Vitamin C 500 Mg Tabs (Ascorbic acid) .... Take 1 tablet by mouth once a day 8)  Aspirin 81 Mg Tbec (Aspirin) .... Take 1 tablet by mouth once a day 9)  Prevacid 30 Mg Cpdr  (Lansoprazole) .... Take 1 tablet by mouth once a day 10)  Sertraline Hcl 100 Mg Tabs (Sertraline hcl) .... Take 1 tablet by mouth once a day 11)  Trazodone Hcl 100 Mg Tabs (Trazodone hcl) .... Take 1 tablet by mouth two times a day 12)  Metformin Hcl 500 Mg Tabs (Metformin hcl) .... Take 1 tablet by mouth once a day 13)  Klor-con M20 20 Meq Tbcr (Potassium chloride crys cr) .... Take 1 tablet by mouth once a day 14)  Niaspan 500 Mg Tbcr (Niacin (antihyperlipidemic)) .... Take two tablets by mouth at bedtime 15)  Revatio 20 Mg Tabs (Sildenafil citrate) .... .tidtab 16)  Effexor Xr 75 Mg Cp24 (Venlafaxine hcl) .... Take 1 tablet by mouth two times a day 17)  Calcium 600+d 600-200 Mg-unit Tabs (Calcium carbonate-vitamin d) .... Take 1 tablet by mouth three times a day 18)  Metoprolol Succinate 50 Mg Xr24h-tab (Metoprolol succinate) .... One tab by mouth qd 19)  Losartan Potassium-hctz 100-12.5 Mg Tabs (Losartan potassium-hctz) .... Take 1 tablet by mouth once a day 20)  Ciprofloxacin Hcl 500 Mg Tabs (Ciprofloxacin hcl) .... One tab by mouth two times a day  Other Orders: T-Basic Metabolic Panel [phone]) T-CBC w/Diff [phone]) T-TSH [phone]) T-Vitamin D (25-Hydroxy) [phone]) Urinalysis ([id number]) T-Culture, Urine ([id number])  Patient Instructions: 1)  Please schedule a follow-up appointment in 4 months. 2)  It is important that you exercise regularly at least 20 minutes 5 times a week. If you develop chest pain, have severe difficulty breathing, or feel very tired , stop exercising immediately and seek medical attention. 3)  You need to lose weight. Consider a lower calorie diet and regular exercise.  4)  BMP prior to visit, ICD-9: 5)  Hepatic Panel prior to visit, ICD-9: 6)  Lipid Panel prior to visit, ICD-9: 7)  TSH prior to visit, ICD-9: 8)  CBC w/ Diff prior to visit, ICD-9: 9)  HbgA1C prior to visit, ICD-9: 10)  Vit D 11)  Check your blood sugars regularly.  If your readings are usually above : or below 70 you should contact our  office. 12)  See your eye doctor yearly to check for diabetic eye damage.We will schedule Prescriptions: CIPROFLOXACIN HCL 500 MG TABS (CIPROFLOXACIN HCL) one tab by mouth two times a day  #10 x 0   Entered by:   Adella Hare LPN   Authorized by:   Syliva Overman MD   Signed by:   Adella Hare LPN on 04/54/0981   Method used:   Electronically to        [facility] [phone]* (retail)       [address]       [place], [place]  [address]       Ph: [phone] or [phone]       Fax: [phone]   RxID:   [id number] LOSARTAN POTASSIUM-HCTZ 100-12.5 MG TABS (LOSARTAN POTASSIUM-HCTZ) Take 1 tablet by mouth once a day  #30 Tablet x 3   Entered by:   Adella Hare LPN   Authorized by:   Syliva Overman MD   Signed by:   Adella Hare LPN on 63/87/5643   Method used:   Electronically to        [facility] [phone]* (retail)       [address]       [place], [place]  [address]       Ph: [phone] or [phone]       Fax: [phone]   RxID:   [id number] SERTRALINE HCL 100 MG  TABS (SERTRALINE HCL) Take 1 tablet by mouth once a day  #30 Tablet x 3   Entered by:   Adella Hare LPN   Authorized by:   Syliva Overman MD   Signed by:   Adella Hare LPN on 71/12/2692   Method used:   Electronically to        [facility] [phone]* (retail)       [address]       [place], [place]  [address]       Ph: [phone] or [phone]       Fax: [phone]   RxID:   [id number]   Laboratory Results   Urine Tests  Date/Time Received: November 07, 2009  Date/Time Reported: November 07, 2009   Routine Urinalysis   Color: yellow Appearance: Clear Glucose: negative   (Normal Range: Negative) Bilirubin: negative   (Normal Range: Negative) Ketone: negative   (Normal Range: Negative) Spec. Gravity: 1.020   (Normal Range: 1.003-1.035) Blood: trace-intact    (Normal Range: Negative) pH: 5.5   (Normal Range: 5.0-8.0) Protein: negative   (Normal Range: Negative) Urobilinogen: 1.0   (Normal Range: 0-1) Nitrite: negative   (Normal Range: Negative) Leukocyte Esterace: small   (Normal Range: Negative)           Medication Administration  Injection # 1:    Medication: Depo- Medrol 80mg     Diagnosis: KNEE PAIN, LEFT (ICD-719.46)    Route: IM    Site: RUOQ gluteus    Exp Date: 11/11    Lot #: [id number]    Mfr: [employer]    Patient tolerated injection without complications    Given by: Adella Hare LPN (November 07, 2009 1:08 PM)  Injection # 2:    Medication: Ketorolac-Toradol 15mg     Diagnosis: KNEE PAIN, LEFT (ICD-719.46)    Route: IM    Site: LUOQ gluteus    Exp Date: 02/20/2011  Lot #: [id number]    Mfr: novaplus    Comments: toradol 60mg  given    Patient tolerated injection without complications    Given by: Adella Hare LPN (November 07, 2009 1:09 PM)  Orders Added: 1)  Est. Patient Level IV [99214] 2)  T-Basic Metabolic Panel [phone] 3)  T-Hepatic Function [80076-22960] 4)  T-Lipid Profile [80061-22930] 5)  T-CBC w/Diff [86578-46962] 6)  T-TSH [95284-13244] 7)  T- Hemoglobin A1C [83036-23375] 8)  T-Vitamin D (25-Hydroxy) [01027-25366] 9)  Depo- Medrol 80mg  [J1040] 10)  Ketorolac-Toradol 15mg  [J1885] 11)  Admin of Therapeutic Inj  intramuscular or subcutaneous [96372] 12)  Urinalysis [81003-65000] 13)  Ophthalmology Referral [Ophthalmology] 14)  T-Culture, Urine [[id number]]

## 2010-08-21 NOTE — Assessment & Plan Note (Signed)
Summary: office visit   Vital Signs:  Patient profile:   65 year old female Menstrual status:  hysterectomy Height:      60 inches Weight:      190.50 pounds BMI:     37.34 O2 Sat:      93 % Pulse rate:   83 / minute Pulse rhythm:   regular Resp:     16 per minute BP sitting:   140 / 84  (left arm)  Vitals Entered By: Everitt Amber LPN (March 13, 2010 11:09 AM)  Nutrition Counseling: Patient's BMI is greater than 25 and therefore counseled on weight management options. CC: Follow up chronic problems   Primary Care Provider:  Syliva Overman MD  CC:  Follow up chronic problems.  History of Present Illness: Reports  that she has been doing fairly well. She continues to experience significant stress at home because of her alcoholic spouse She is still in therapy which helps her alot. Denies recent fever or chills. Denies sinus pressure, nasal congestion , ear pain or sore throat. Denies chest congestion, or cough productive of sputum. Denies chest pain, palpitations, PND, orthopnea or leg swelling. Denies abdominal pain, nausea, vomitting, diarrhea or constipation. Denies change in bowel movements or bloody stool. Denies dysuria , frequency, incontinence or hesitancy. Denies  joint pain, swelling, or reduced mobility.  Denies  rash, lesions, or itch.     Current Medications (verified): 1)  Astelin 137 Mcg/spray  Soln (Azelastine Hcl) .... Two Sprays Each Nostril Twice Daily 2)  Albuterol 90 Mcg/act  Aers (Albuterol) .... Two Puffs As Needed 3)  Vytorin 10-80 Mg  Tabs (Ezetimibe-Simvastatin) .... Take 1 Tab By Mouth At Bedtime 4)  Flonase 50 Mcg/act  Susp (Fluticasone Propionate) .... One Spray Each Nostril Once Daily 5)  Cyclobenzaprine Hcl 5 Mg Tabs (Cyclobenzaprine Hcl) .... Take 1 Tab By Mouth At Bedtime 6)  Tricor 145 Mg  Tabs (Fenofibrate) .... Take 1 Tab By Mouth At Bedtime 7)  Vitamin C 500 Mg  Tabs (Ascorbic Acid) .... Take 1 Tablet By Mouth Once A Day 8)   Aspirin 81 Mg  Tbec (Aspirin) .... Take 1 Tablet By Mouth Once A Day 9)  Prevacid 30 Mg  Cpdr (Lansoprazole) .... Take 1 Tablet By Mouth Once A Day 10)  Sertraline Hcl 100 Mg  Tabs (Sertraline Hcl) .... Take 1 Tablet By Mouth Once A Day 11)  Trazodone Hcl 100 Mg  Tabs (Trazodone Hcl) .... Take 1 Tablet By Mouth Two Times A Day 12)  Metformin Hcl 500 Mg  Tabs (Metformin Hcl) .... Take 1 Tablet By Mouth Once A Day 13)  Klor-Con M20 20 Meq  Tbcr (Potassium Chloride Crys Cr) .... Take 1 Tablet By Mouth Once A Day 14)  Niaspan 500 Mg  Tbcr (Niacin (Antihyperlipidemic)) .... Take Two Tablets By Mouth At Bedtime 15)  Revatio 20 Mg  Tabs (Sildenafil Citrate) .... .tidtab 16)  Effexor Xr 75 Mg  Cp24 (Venlafaxine Hcl) .... Take 1 Tablet By Mouth Two Times A Day 17)  Calcium 600+d 600-200 Mg-Unit  Tabs (Calcium Carbonate-Vitamin D) .... Take 1 Tablet By Mouth Three Times A Day 18)  Metoprolol Succinate 50 Mg Xr24h-Tab (Metoprolol Succinate) .... One Tab By Mouth Qd 19)  Losartan Potassium-Hctz 100-12.5 Mg Tabs (Losartan Potassium-Hctz) .... Take 1 Tablet By Mouth Once A Day 20)  Ciprofloxacin Hcl 500 Mg Tabs (Ciprofloxacin Hcl) .... One Tab By Mouth Two Times A Day  Allergies (verified): No Known Drug Allergies  Review  of Systems      See HPI Eyes:  Denies blurring, discharge, eye pain, and red eye. MS:  right heel pain x 2 weeks worse when she justs starts walking. Derm:  Complains of itching and rash; 2  month h/o puriytic rash between breasts. Psych:  Complains of anxiety and depression; denies mental problems, suicidal thoughts/plans, thoughts of violence, and unusual visions or sounds; therapy every 2 weeks. Endo:  Denies excessive thirst and excessive urination; fatsing sugars when tested ar seldom over 120. Heme:  Denies abnormal bruising and bleeding. Allergy:  Denies hives or rash and itching eyes.  Physical Exam  General:  Well-developed,obese,in no acute distress; alert,appropriate and  cooperative throughout examination. HEENT: No facial asymmetry,  EOMI, No sinus tenderness, TM's Clear, oropharynx  pink and moist.   Chest: Clear to auscultation bilaterally.  CVS: S1, S2, No murmurs, No S3.   Abd: Soft, Nontender.  MS: decreased  ROM spine,adequate in  hips, shoulders and knees. Right  heel tender to palpation Ext: No edema.   CNS: CN 2-12 intact, power tone and sensation normal throughout.   Skin: Intact, no visible lesions or rashes.  Psych: Good eye contact, normal affect.  Memory intact, not anxious or depressed appearing.   Diabetes Management Exam:    Foot Exam (with socks and/or shoes not present):       Sensory-Monofilament:          Left foot: diminished          Right foot: diminished       Inspection:          Left foot: normal          Right foot: normal       Nails:          Left foot: normal          Right foot: normal   Impression & Recommendations:  Problem # 1:  HEEL PAIN, RIGHT (ICD-729.5) Assessment Comment Only  Orders: Radiology other (Radiology Other). likely due to heel spur Depo- Medrol 80mg  (J1040) Ketorolac-Toradol 15mg  (U9811) Admin of Therapeutic Inj  intramuscular or subcutaneous (91478)  Problem # 2:  KNEE PAIN, LEFT (ICD-719.46) Assessment: Unchanged  Her updated medication list for this problem includes:    Cyclobenzaprine Hcl 5 Mg Tabs (Cyclobenzaprine hcl) .Marland Kitchen... Take 1 tab by mouth at bedtime    Aspirin 81 Mg Tbec (Aspirin) .Marland Kitchen... Take 1 tablet by mouth once a day    Ibuprofen 600 Mg Tabs (Ibuprofen) .Marland Kitchen... Take 1 tablet by mouth two times a day  Problem # 3:  HYPERLIPIDEMIA (ICD-272.4) Assessment: Comment Only  The following medications were removed from the medication list:    Vytorin 10-80 Mg Tabs (Ezetimibe-simvastatin) .Marland Kitchen... Take 1 tab by mouth at bedtime Her updated medication list for this problem includes:    Tricor 145 Mg Tabs (Fenofibrate) .Marland Kitchen... Take 1 tab by mouth at bedtime    Niaspan 500 Mg Tbcr  (Niacin (antihyperlipidemic)) .Marland Kitchen... Take two tablets by mouth at bedtime    Crestor 40 Mg Tabs (Rosuvastatin calcium) .Marland Kitchen... Take 1 tab by mouth at bedtime discontinue vytorin  Orders: T-Hepatic Function [phone]) T-Lipid Profile [phone])  Labs Reviewed: SGOT: 10 (11/07/2009)   SGPT: <8 U/L (11/07/2009)   HDL:77 (11/07/2009), 75 (06/30/2009)  LDL:129 (11/07/2009), 82 (28/41/3244)  Chol:218 (11/07/2009), 167 (06/30/2009)  Trig:62 (11/07/2009), 52 (06/30/2009)  Problem # 4:  DIABETES MELLITUS, TYPE II (ICD-250.00) Assessment: Comment Only  Her updated medication list for this problem includes:  Aspirin 81 Mg Tbec (Aspirin) .Marland Kitchen... Take 1 tablet by mouth once a day    Metformin Hcl 500 Mg Tabs (Metformin hcl) .Marland Kitchen... Take 1 tablet by mouth once a day    Losartan Potassium-hctz 100-12.5 Mg Tabs (Losartan potassium-hctz) .Marland Kitchen... Take 1 tablet by mouth once a day  Orders: T- Hemoglobin A1C ([id number]) T-Urine Microalbumin w/creat. ratio [phone])  Labs Reviewed: Creat: 0.75 (11/07/2009)    Reviewed HgBA1c results: 6.0 (11/07/2009)  5.8 (07/03/2009)  Problem # 5:  HYPERTENSION (ICD-401.9) Assessment: Unchanged  Her updated medication list for this problem includes:    Metoprolol Succinate 50 Mg Xr24h-tab (Metoprolol succinate) ..... One tab by mouth qd    Losartan Potassium-hctz 100-12.5 Mg Tabs (Losartan potassium-hctz) .Marland Kitchen... Take 1 tablet by mouth once a day  Orders: T-Basic Metabolic Panel ([id number])  BP today: 140/84 Prior BP: 140/100 (11/07/2009)  Labs Reviewed: K+: 3.6 (11/07/2009) Creat: : 0.75 (11/07/2009)   Chol: 218 (11/07/2009)   HDL: 77 (11/07/2009)   LDL: 129 (11/07/2009)   TG: 62 (11/07/2009)  Complete Medication List: 1)  Astelin 137 Mcg/spray Soln (Azelastine hcl) .... Two sprays each nostril twice daily 2)  Albuterol 90 Mcg/act Aers (Albuterol) .... Two puffs as needed 3)  Flonase 50 Mcg/act Susp (Fluticasone propionate) .... One spray each  nostril once daily 4)  Cyclobenzaprine Hcl 5 Mg Tabs (Cyclobenzaprine hcl) .... Take 1 tab by mouth at bedtime 5)  Tricor 145 Mg Tabs (Fenofibrate) .... Take 1 tab by mouth at bedtime 6)  Vitamin C 500 Mg Tabs (Ascorbic acid) .... Take 1 tablet by mouth once a day 7)  Aspirin 81 Mg Tbec (Aspirin) .... Take 1 tablet by mouth once a day 8)  Prevacid 30 Mg Cpdr (Lansoprazole) .... Take 1 tablet by mouth once a day 9)  Sertraline Hcl 100 Mg Tabs (Sertraline hcl) .... Take 1 tablet by mouth once a day 10)  Trazodone Hcl 100 Mg Tabs (Trazodone hcl) .... Take 1 tablet by mouth two times a day 11)  Metformin Hcl 500 Mg Tabs (Metformin hcl) .... Take 1 tablet by mouth once a day 12)  Klor-con M20 20 Meq Tbcr (Potassium chloride crys cr) .... Take 1 tablet by mouth once a day 13)  Niaspan 500 Mg Tbcr (Niacin (antihyperlipidemic)) .... Take two tablets by mouth at bedtime 14)  Revatio 20 Mg Tabs (Sildenafil citrate) .... .tidtab 15)  Effexor Xr 75 Mg Cp24 (Venlafaxine hcl) .... Take 1 tablet by mouth two times a day 16)  Calcium 600+d 600-200 Mg-unit Tabs (Calcium carbonate-vitamin d) .... Take 1 tablet by mouth three times a day 17)  Metoprolol Succinate 50 Mg Xr24h-tab (Metoprolol succinate) .... One tab by mouth qd 18)  Losartan Potassium-hctz 100-12.5 Mg Tabs (Losartan potassium-hctz) .... Take 1 tablet by mouth once a day 19)  Ciprofloxacin Hcl 500 Mg Tabs (Ciprofloxacin hcl) .... One tab by mouth two times a day 20)  Prednisone (pak) 5 Mg Tabs (Prednisone) .... Use as directed 21)  Ibuprofen 600 Mg Tabs (Ibuprofen) .... Take 1 tablet by mouth two times a day 22)  Crestor 40 Mg Tabs (Rosuvastatin calcium) .... Take 1 tab by mouth at bedtime discontinue vytorin  Patient Instructions: 1)  CPE in 3 months. 2)  You will get ionjections today for your right heel pain, and meds are also sent to yourpharmacy. 3)  PLS call next week if the pain persits 4)  It is important that you exercise regularly at  least 20 minutes 5 times a week.  If you develop chest pain, have severe difficulty breathing, or feel very tired , stop exercising immediately and seek medical attention. 5)  You need to lose weight. Consider a lower calorie diet and regular exercise.  6)  BMP prior to visit, ICD-9: 7)  Hepatic Panel prior to visit, ICD-9:  fasting today 8)  Lipid Panel prior to visit, ICD-9: 9)  HbgA1C prior to visit, ICD-9: 10)  Urine Microalbumin prior to visit, ICD-9:  from the office today Prescriptions: CRESTOR 40 MG TABS (ROSUVASTATIN CALCIUM) Take 1 tab by mouth at bedtime  #309 x 4   Entered and Authorized by:   Syliva Overman MD   Signed by:   Syliva Overman MD on 03/14/2010   Method used:   Historical   RxID:   [id number] LOSARTAN POTASSIUM-HCTZ 100-12.5 MG TABS (LOSARTAN POTASSIUM-HCTZ) Take 1 tablet by mouth once a day  #30 Tablet x 3   Entered by:   Adella Hare LPN   Authorized by:   Syliva Overman MD   Signed by:   Adella Hare LPN on 62/13/0865   Method used:   Electronically to        [facility] [phone]* (retail)       [address]       [place], [place]  [address]       Ph: [phone] or [phone]       Fax: [phone]   RxID:   [id number] METOPROLOL SUCCINATE 50 MG XR24H-TAB (METOPROLOL SUCCINATE) one tab by mouth qd  #30 Tablet x 3   Entered by:   Adella Hare LPN   Authorized by:   Syliva Overman MD   Signed by:   Adella Hare LPN on 66/12/3014   Method used:   Electronically to        [facility] [phone]* (retail)       [address]       [place], [place]  [address]       Ph: [phone] or [phone]       Fax: [phone]   RxID:   [id number] CYCLOBENZAPRINE HCL 5 MG TABS (CYCLOBENZAPRINE HCL) Take 1 tab by mouth at bedtime  #30 Tablet x 3   Entered by:   Adella Hare LPN   Authorized by:   Syliva Overman MD   Signed by:   Adella Hare LPN on 00/93/8182   Method used:   Electronically to        [facility]. [phone]* (retail)       [address]       [place], [place]  [address]       Ph: [phone] or [phone]       Fax: [phone]   RxID:   [id number] METFORMIN HCL 500 MG  TABS (METFORMIN HCL) Take 1 tablet by mouth once a day  #30 Tablet x 3   Entered by:   Adella Hare LPN   Authorized by:   Syliva Overman MD   Signed by:   Adella Hare LPN on 67/06/4579   Method used:   Electronically to        [facility] [phone]* (retail)       [address]       [place], [place]  [address]  Ph: [phone] or [phone]       Fax: [phone]   RxID:   [id number] IBUPROFEN 600 MG TABS (IBUPROFEN) Take 1 tablet by mouth two times a day  #20 x 0   Entered and Authorized by:   Syliva Overman MD   Signed by:   Syliva Overman MD on 03/13/2010   Method used:   Electronically to        [facility]. [phone]* (retail)       [address]       [place], [place]  [address]       Ph: [phone] or [phone]       Fax: [phone]   RxID:   [id number] PREDNISONE (PAK) 5 MG TABS (PREDNISONE) Use as directed  #21 x 0   Entered and Authorized by:   Syliva Overman MD   Signed by:   Syliva Overman MD on 03/13/2010   Method used:   Electronically to        [facility]. [phone]* (retail)       [address]       [place], [place]  [address]       Ph: [phone] or [phone]       Fax: [phone]   RxID:   [id number]    Medication Administration  Injection # 1:    Medication: Depo- Medrol 80mg     Diagnosis: HEEL PAIN, RIGHT (ICD-729.5)    Route: IM    Site: RUOQ gluteus    Exp Date: 04/12    Lot #: [id number]    Mfr: [employer]    Patient tolerated injection without complications    Given by: Adella Hare LPN (March 13, 2010 11:59 AM)  Injection # 2:    Medication: Ketorolac-Toradol 15mg     Diagnosis: HEEL PAIN, RIGHT (ICD-729.5)    Route: IM    Site: LUOQ gluteus    Exp  Date: 09/20/2011    Lot #: [id number]    Mfr: [email or web address]    Comments: toradol 60mg  given    Patient tolerated injection without complications    Given by: Adella Hare LPN (March 13, 2010 12:00 PM)  Orders Added: 1)  Radiology other [Radiology Other] 2)  Est. Patient Level IV [[address]] 3)  T-Basic Metabolic Panel [80048-22910] 4)  T-Hepatic Function [80076-22960] 5)  T-Lipid Profile [80061-22930] 6)  T- Hemoglobin A1C [83036-23375] 7)  T-Urine Microalbumin w/creat. ratio [[address]43-[address]] 8)  Depo- Medrol 80mg  [J1040] 9)  Ketorolac-Toradol 15mg  [J1885] 10)  Admin of Therapeutic Inj  intramuscular or subcutaneous [72094]

## 2010-08-21 NOTE — Progress Notes (Signed)
[facility]   Imported By: Lind Guest 07/27/2009 13:51:09  _____________________________________________________________________  External Attachment:    Type:   Image     Comment:   External Document

## 2010-08-21 NOTE — Assessment & Plan Note (Signed)
Summary: office visit   Vital Signs:  Patient profile:   65 year old female Menstrual status:  hysterectomy Height:      60 inches Weight:      198.50 pounds BMI:     38.91 O2 Sat:      98 % on Room air Pulse rate:   80 / minute Pulse rhythm:   regular Resp:     16 per minute BP sitting:   140 / 70  (left arm)  Vitals Entered By: Adella Hare LPN (June 12, 2010 11:41 AM)  Nutrition Counseling: Patient's BMI is greater than 25 and therefore counseled on weight management options.  O2 Flow:  Room air CC: follow-up visit Is Patient Diabetic? Yes Did you bring your meter with you today? No   Primary Care Provider:  Syliva Overman MD  CC:  follow-up visit.  History of Present Illness: Reports  that she has been doinfg fairly well. Denies recent fever or chills. Denies sinus pressure, nasal congestion , ear pain or sore throat. Denies chest congestion, or cough productive of sputum. Denies chest pain, palpitations, PND, orthopnea or leg swelling. Denies abdominal pain, nausea, vomitting, diarrhea or constipation. Denies change in bowel movements or bloody stool. Denies dysuria , frequency, incontinence or hesitancy.  Denies headaches, vertigo, seizures. Denies uncontrolled depression, anxiety or insomnia.She still goes to therapy regulalrly Denies  rash, lesions, or itch.     Current Medications (verified): 1)  Astelin 137 Mcg/spray  Soln (Azelastine Hcl) .... Two Sprays Each Nostril Twice Daily 2)  Albuterol 90 Mcg/act  Aers (Albuterol) .... Two Puffs As Needed 3)  Flonase 50 Mcg/act  Susp (Fluticasone Propionate) .... One Spray Each Nostril Once Daily 4)  Cyclobenzaprine Hcl 5 Mg Tabs (Cyclobenzaprine Hcl) .... Take 1 Tab By Mouth At Bedtime 5)  Tricor 145 Mg  Tabs (Fenofibrate) .... Take 1 Tab By Mouth At Bedtime 6)  Vitamin C 500 Mg  Tabs (Ascorbic Acid) .... Take 1 Tablet By Mouth Once A Day 7)  Aspirin 81 Mg  Tbec (Aspirin) .... Take 1 Tablet By Mouth Once A  Day 8)  Prevacid 30 Mg  Cpdr (Lansoprazole) .... Take 1 Tablet By Mouth Once A Day 9)  Sertraline Hcl 100 Mg  Tabs (Sertraline Hcl) .... Take 1 Tablet By Mouth Once A Day 10)  Trazodone Hcl 100 Mg  Tabs (Trazodone Hcl) .... Take 1 Tablet By Mouth Two Times A Day 11)  Metformin Hcl 500 Mg  Tabs (Metformin Hcl) .... Take 1 Tablet By Mouth Once A Day 12)  Klor-Con M20 20 Meq  Tbcr (Potassium Chloride Crys Cr) .... Take 1 Tablet By Mouth Once A Day 13)  Niaspan 500 Mg  Tbcr (Niacin (Antihyperlipidemic)) .... Take Two Tablets By Mouth At Bedtime 14)  Revatio 20 Mg  Tabs (Sildenafil Citrate) .... .tidtab 15)  Calcium 600+d 600-200 Mg-Unit  Tabs (Calcium Carbonate-Vitamin D) .... Take 1 Tablet By Mouth Three Times A Day 16)  Metoprolol Succinate 50 Mg Xr24h-Tab (Metoprolol Succinate) .... One Tab By Mouth Qd 17)  Losartan Potassium-Hctz 100-12.5 Mg Tabs (Losartan Potassium-Hctz) .... Take 1 Tablet By Mouth Once A Day 18)  Crestor 40 Mg Tabs (Rosuvastatin Calcium) .... Take 1 Tab By Mouth At Bedtime Discontinue Vytorin  Allergies (verified): No Known Drug Allergies  Review of Systems      See HPI General:  Complains of fatigue. Eyes:  Denies discharge and red eye. MS:  Complains of joint pain; 3 week h/o right heel pain,  has had probs with heel spur in the past wants treatment here. Psych:  Complains of anxiety, depression, and mental problems; denies suicidal thoughts/plans, thoughts of violence, and unusual visions or sounds. Endo:  Denies excessive hunger, excessive thirst, and excessive urination. Heme:  Denies abnormal bruising and bleeding. Allergy:  Complains of seasonal allergies.  Physical Exam  General:  Well-developed,obese,in no acute distress; alert,appropriate and cooperative throughout examination. HEENT: No facial asymmetry,  EOMI, No sinus tenderness, TM's Clear, oropharynx  pink and moist.   Chest: Clear to auscultation bilaterally.  CVS: S1, S2, No murmurs, No S3.   Abd:  Soft, Nontender.  MS: decreased  ROM spine,adequate in  hips, shoulders and knees. Right  heel tender to palpation Ext: No edema.   CNS: CN 2-12 intact, power tone and sensation normal throughout.   Skin: Intact, no visible lesions or rashes.  Psych: Good eye contact, normal affect.  Memory intact, not anxious or depressed appearing.    Impression & Recommendations:  Problem # 1:  HEEL PAIN, RIGHT (ICD-729.5) Assessment Comment Only  Orders: Medicare Electronic Prescription (Z6109) prednisone dose pack and ibuprofen prescribed  Problem # 2:  HYPERLIPIDEMIA (ICD-272.4) Assessment: Comment Only  Her updated medication list for this problem includes:    Tricor 145 Mg Tabs (Fenofibrate) .Marland Kitchen... Take 1 tab by mouth at bedtime    Niaspan 500 Mg Tbcr (Niacin (antihyperlipidemic)) .Marland Kitchen... Take two tablets by mouth at bedtime    Crestor 40 Mg Tabs (Rosuvastatin calcium) .Marland Kitchen... Take 1 tab by mouth at bedtime discontinue vytorin  Orders: T-Lipid Profile (60454-09811) T-Hepatic Function [phone]) Low fat dietdiscussed and encouraged  Labs Reviewed: SGOT: 18 (03/13/2010)   SGPT: 11 (03/13/2010)   HDL:87 (03/13/2010), 77 (11/07/2009)  LDL:130 (03/13/2010), 129 (11/07/2009)  Chol:230 (03/13/2010), 218 (11/07/2009)  Trig:63 (03/13/2010), 62 (11/07/2009)  Problem # 3:  HYPERTENSION (ICD-401.9) Assessment: Unchanged  Her updated medication list for this problem includes:    Metoprolol Succinate 50 Mg Xr24h-tab (Metoprolol succinate) ..... One tab by mouth qd    Losartan Potassium-hctz 100-12.5 Mg Tabs (Losartan potassium-hctz) .Marland Kitchen... Take 1 tablet by mouth once a day  BP today: 140/70 Prior BP: 140/84 (03/13/2010)  Labs Reviewed: K+: 3.9 (03/13/2010) Creat: : 0.80 (03/13/2010)   Chol: 230 (03/13/2010)   HDL: 87 (03/13/2010)   LDL: 130 (03/13/2010)   TG: 63 (03/13/2010)  Problem # 4:  DEPRESSION (ICD-311) Assessment: Improved  The following medications were removed from the medication  list:    Effexor Xr 75 Mg Cp24 (Venlafaxine hcl) .Marland Kitchen... Take 1 tablet by mouth two times a day Her updated medication list for this problem includes:    Sertraline Hcl 100 Mg Tabs (Sertraline hcl) .Marland Kitchen... Take 1 tablet by mouth once a day    Trazodone Hcl 100 Mg Tabs (Trazodone hcl) .Marland Kitchen... Take 1 tablet by mouth two times a day  Complete Medication List: 1)  Astelin 137 Mcg/spray Soln (Azelastine hcl) .... Two sprays each nostril twice daily 2)  Albuterol 90 Mcg/act Aers (Albuterol) .... Two puffs as needed 3)  Flonase 50 Mcg/act Susp (Fluticasone propionate) .... One spray each nostril once daily 4)  Cyclobenzaprine Hcl 5 Mg Tabs (Cyclobenzaprine hcl) .... Take 1 tab by mouth at bedtime 5)  Tricor 145 Mg Tabs (Fenofibrate) .... Take 1 tab by mouth at bedtime 6)  Vitamin C 500 Mg Tabs (Ascorbic acid) .... Take 1 tablet by mouth once a day 7)  Aspirin 81 Mg Tbec (Aspirin) .... Take 1 tablet by mouth once a day  8)  Prevacid 30 Mg Cpdr (Lansoprazole) .... Take 1 tablet by mouth once a day 9)  Sertraline Hcl 100 Mg Tabs (Sertraline hcl) .... Take 1 tablet by mouth once a day 10)  Trazodone Hcl 100 Mg Tabs (Trazodone hcl) .... Take 1 tablet by mouth two times a day 11)  Metformin Hcl 500 Mg Tabs (Metformin hcl) .... Take 1 tablet by mouth once a day 12)  Klor-con M20 20 Meq Tbcr (Potassium chloride crys cr) .... Take 1 tablet by mouth once a day 13)  Niaspan 500 Mg Tbcr (Niacin (antihyperlipidemic)) .... Take two tablets by mouth at bedtime 14)  Revatio 20 Mg Tabs (Sildenafil citrate) .... .tidtab 15)  Calcium 600+d 600-200 Mg-unit Tabs (Calcium carbonate-vitamin d) .... Take 1 tablet by mouth three times a day 16)  Metoprolol Succinate 50 Mg Xr24h-tab (Metoprolol succinate) .... One tab by mouth qd 17)  Losartan Potassium-hctz 100-12.5 Mg Tabs (Losartan potassium-hctz) .... Take 1 tablet by mouth once a day 18)  Crestor 40 Mg Tabs (Rosuvastatin calcium) .... Take 1 tab by mouth at bedtime discontinue  vytorin 19)  Ibuprofen 800 Mg Tabs (Ibuprofen) .... Take 1 tablet by mouth two times a day  Other Orders: T- Hemoglobin A1C ([id number]) Pneumococcal Vaccine (57846) Admin 1st Vaccine (96295)  Patient Instructions: 1)  Please schedule a follow-up appointment in 4 months. 2)  It is important that you exercise regularly at least 30 minutes 5 times a week. If you develop chest pain, have severe difficulty breathing, or feel very tired , stop exercising immediately and seek medical attention. 3)  You need to lose weight. Consider a lower calorie diet and regular exercise. goal is 6 pounds 4)  Hepatic Panel prior to visit, ICD-9: 5)  Lipid Panel prior to visit, ICD-9:  fasting today 6)  HbgA1C prior to visit, ICD-9 7)  pneumovac today: 8)  meds are sent in for your right heel pain Prescriptions: LOSARTAN POTASSIUM-HCTZ 100-12.5 MG TABS (LOSARTAN POTASSIUM-HCTZ) Take 1 tablet by mouth once a day  #30 Tablet x 3   Entered by:   Adella Hare LPN   Authorized by:   Syliva Overman MD   Signed by:   Adella Hare LPN on 28/41/3244   Method used:   Electronically to        [facility] [phone]* (retail)       [address]       [place], [place]  [address]       Ph: [phone] or [phone]       Fax: [phone]   RxID:   [id number] PREDNISONE (PAK) 5 MG TABS (PREDNISONE) Use as directed  #21 x 0   Entered and Authorized by:   Syliva Overman MD   Signed by:   Syliva Overman MD on 06/12/2010   Method used:   [facility] [address] [phone]* (retail)       [address]       [place], [place]  [address]       Ph: [phone] or [phone]       Fax: [phone]   RxID:   [id number] IBUPROFEN 800 MG TABS (IBUPROFEN) Take 1 tablet by mouth two times a day  #20 x 0   Entered and Authorized by:   Syliva Overman MD   Signed by:   Syliva Overman MD on 06/12/2010  Method used:   [facility]ronically to        [facility] [phone][facility]  (retail)       [address]       [place], [place]  [address]       Ph: [phone] or [phone]       Fax: [phone]   RxID:   [id number]    Orders Added: 1)  Est. Patient Level IV [36644] 2)  T-Lipid Profile [80061-22930] 3)  T-Hepatic Function [80076-22960] 4)  T- Hemoglobin A1C [83036-23375] 5)  Pneumococcal Vaccine [90732] 6)  Admin 1st Vaccine [90471] 7)  Medicare Electronic Prescription [I3474]   Immunizations Administered:  Pneumonia Vaccine:    Vaccine Type: Pneumovax    Site: right deltoid    Mfr: Merck    Dose: 0.5 ml    Route: IM    Given by: Adella Hare LPN    Exp. Date: 04/09/2012    Lot #: [id number]    VIS given: 06/26/09 version given June 12, 2010.   Immunizations Administered:  Pneumonia Vaccine:    Vaccine Type: Pneumovax    Site: right deltoid    Mfr: Merck    Dose: 0.5 ml    Route: IM    Given by: Adella Hare LPN    Exp. Date: 04/09/2012    Lot #: [id number]    VIS given: 06/26/09 version given June 12, 2010.

## 2010-08-21 NOTE — Progress Notes (Signed)
Summary: MEDICINE  Phone Note Call from Patient   Summary of Call: CONFUSED ABOUT HER MEDICINE CALL BACK [phone] Initial call taken by: Lind Guest,  March 15, 2010 10:42 AM  Follow-up for Phone Call        Advised patient to stop Vytorin and start Crestor. She understands Follow-up by: Everitt Amber LPN,  March 15, 2010 10:48 AM

## 2010-08-21 NOTE — Progress Notes (Signed)
Summary: OPHTHALMOLOGIC  OPHTHALMOLOGIC   Imported By: Lind Guest 12/05/2009 13:55:33  _____________________________________________________________________  External Attachment:    Type:   Image     Comment:   External Document

## 2010-08-21 NOTE — Progress Notes (Signed)
[facility]   Imported By: Lind Guest 01/29/2010 13:50:30  _____________________________________________________________________  External Attachment:    Type:   Image     Comment:   External Document

## 2010-08-23 NOTE — Letter (Signed)
Summary: External Correspondence  External Correspondence   Imported By: Lind Guest 07/30/2010 13:03:32  _____________________________________________________________________  External Attachment:    Type:   Image     Comment:   External Document

## 2010-08-29 ENCOUNTER — Telehealth (INDEPENDENT_AMBULATORY_CARE_PROVIDER_SITE_OTHER): Payer: Self-pay | Admitting: *Deleted

## 2010-08-29 ENCOUNTER — Encounter (INDEPENDENT_AMBULATORY_CARE_PROVIDER_SITE_OTHER): Payer: Medicare Other | Admitting: Psychiatry

## 2010-08-29 DIAGNOSIS — F4323 Adjustment disorder with mixed anxiety and depressed mood: Secondary | ICD-10-CM

## 2010-08-30 ENCOUNTER — Other Ambulatory Visit: Payer: Self-pay | Admitting: Family Medicine

## 2010-08-30 DIAGNOSIS — Z139 Encounter for screening, unspecified: Secondary | ICD-10-CM

## 2010-09-06 NOTE — Progress Notes (Signed)
Summary: mammo  Phone Note Call from Patient   Summary of Call: request mamo be scheduled, states she got her noteice, pls sched at [facility] if due Initial call taken by: Syliva Overman MD,  August 29, 2010 5:44 PM  Follow-up for Phone Call        pt was scheduled of appt and called with it. pt aware. appt 09/10/2010 9:40. Follow-up by: Rudene Anda,  August 30, 2010 3:56 PM

## 2010-09-10 ENCOUNTER — Ambulatory Visit (HOSPITAL_COMMUNITY): Payer: Medicare Other

## 2010-09-13 ENCOUNTER — Ambulatory Visit (HOSPITAL_COMMUNITY)
Admission: RE | Admit: 2010-09-13 | Discharge: 2010-09-13 | Disposition: A | Discharge status: Home / Self Care | Payer: Medicare Other | Source: Ambulatory Visit | Attending: Family Medicine | Admitting: Family Medicine

## 2010-09-13 DIAGNOSIS — Z139 Encounter for screening, unspecified: Secondary | ICD-10-CM

## 2010-09-13 DIAGNOSIS — Z1231 Encounter for screening mammogram for malignant neoplasm of breast: Secondary | ICD-10-CM | POA: Insufficient documentation

## 2010-09-17 ENCOUNTER — Encounter: Payer: Self-pay | Admitting: Family Medicine

## 2010-09-20 ENCOUNTER — Encounter (INDEPENDENT_AMBULATORY_CARE_PROVIDER_SITE_OTHER): Payer: Medicare Other | Admitting: Psychiatry

## 2010-09-20 DIAGNOSIS — F4323 Adjustment disorder with mixed anxiety and depressed mood: Secondary | ICD-10-CM

## 2010-10-09 ENCOUNTER — Encounter: Payer: Self-pay | Admitting: Family Medicine

## 2010-10-10 ENCOUNTER — Encounter: Payer: Self-pay | Admitting: Family Medicine

## 2010-10-11 ENCOUNTER — Other Ambulatory Visit: Payer: Self-pay | Admitting: Family Medicine

## 2010-10-11 ENCOUNTER — Encounter: Payer: Self-pay | Admitting: Family Medicine

## 2010-10-11 ENCOUNTER — Ambulatory Visit (INDEPENDENT_AMBULATORY_CARE_PROVIDER_SITE_OTHER): Payer: Medicare Other | Admitting: Family Medicine

## 2010-10-11 VITALS — BP 150/90 | HR 74 | Resp 16 | Ht 62.0 in | Wt 191.0 lb

## 2010-10-11 DIAGNOSIS — I10 Essential (primary) hypertension: Secondary | ICD-10-CM

## 2010-10-11 DIAGNOSIS — G5602 Carpal tunnel syndrome, left upper limb: Secondary | ICD-10-CM

## 2010-10-11 DIAGNOSIS — E119 Type 2 diabetes mellitus without complications: Secondary | ICD-10-CM

## 2010-10-11 DIAGNOSIS — G5600 Carpal tunnel syndrome, unspecified upper limb: Secondary | ICD-10-CM

## 2010-10-11 MED ORDER — FENOFIBRATE 145 MG PO TABS: 145.0000 mg | ORAL_TABLET | Freq: Every day | ORAL | Status: DC

## 2010-10-11 MED ORDER — CYCLOBENZAPRINE HCL 5 MG PO TABS: 5.0000 mg | ORAL_TABLET | Freq: Every day | ORAL | Status: DC

## 2010-10-11 MED ORDER — METOPROLOL SUCCINATE ER 50 MG PO TB24: 50.0000 mg | ORAL_TABLET | Freq: Every day | ORAL | Status: DC

## 2010-10-11 MED ORDER — LOSARTAN POTASSIUM-HCTZ 100-25 MG PO TABS: 1.0000 | ORAL_TABLET | Freq: Every day | ORAL | Status: DC

## 2010-10-11 MED ORDER — SERTRALINE HCL 100 MG PO TABS: 100.0000 mg | ORAL_TABLET | Freq: Every day | ORAL | Status: DC

## 2010-10-11 MED ORDER — METFORMIN HCL 500 MG PO TABS: 500.0000 mg | ORAL_TABLET | Freq: Every day | ORAL | Status: DC

## 2010-10-11 MED ORDER — SILDENAFIL CITRATE 20 MG PO TABS: 20.0000 mg | ORAL_TABLET | Freq: Three times a day (TID) | ORAL | Status: DC

## 2010-10-11 MED ORDER — LANSOPRAZOLE 30 MG PO CPDR: 30.0000 mg | DELAYED_RELEASE_CAPSULE | Freq: Every day | ORAL | Status: DC

## 2010-10-11 NOTE — Patient Instructions (Addendum)
F/U in 4 months.  Regular exercise and weight loss encouraged.  You are being referred for testing for possible carpal tunnel of the left hand.  Use tylenol for neck pain one twice daily for the next 5 days, you have no sign of infection.  HBA1C  today

## 2010-10-11 NOTE — Progress Notes (Signed)
  Subjective:    Patient ID: [name], female    DOB: [birth year], 65 y.o.   MRN: [id number] The PT is here for follow up and re-evaluation of chronic medical conditions, medication management and review of recent lab and radiology data.  Preventive health is updated, specifically  Cancer screening, Osteoporosis screening and Immunization.   Questions or concerns regarding consultations or procedures which the PT has had in the interim are  addressed. The PT denies any adverse reactions to current medications since the last visit.  She does have complaints and concerns  Sore Throat  Associated symptoms include neck pain.  Pt reports sore are on right side of her throat with pressure, no fever or chills,nasal drainage or cough.    Review of Systems  HENT: Positive for neck pain.        Right neck pain outside and inside , even sore to the touch x 1 week, no fever or chills  Neurological: Positive for numbness.       Tingling in left fingertips x 1 month throughout the day, no weakness, and loss of power   Denies recent fever or chills. Denies sinus pressure, nasal congestion or ear pain  Denies chest congestion, productive cough or wheezing. Denies chest pains, palpitations, paroxysmal nocturnal dyspnea, orthopnea and leg swelling Denies abdominal pain, nausea, vomiting,diarrhea or constipation.  Denies rectal bleeding or change in bowel movement. Denies dysuria, frequency, hesitancy or incontinence. Denies headaches, seizure, numbness, or tingling. Denies depression, anxiety or insomnia. Denies skin break down or rash. Tests blood sugars on avg 3 times weekly, fastings generally less than 110. She denies polyuria, polydypsia or blurred vision     Objective:   Physical Exam Patient alert and oriented and in no Cardiopulmonary distress.  HEENT: No facial asymmetry, EOMI, no sinus tenderness, TM's clear, Oropharynx pink and moist.  Neck decreased ROM , tender on right, no  cervical adenopathy.  Chest: Clear to auscultation bilaterally.  CVS: S1, S2 no murmurs, no S3.  ABD: Soft non tender. Bowel sounds normal.  Ext: No edema  AO:[id number] ROM spine, shoulders, hips and knees.Positive tinel's sign on exam  Skin: Intact, no ulcerations or rash noted.  Psych: Good eye contact, normal affect. Memory intact not anxious or depressed appearing.  CNS: CN 2-12 intact, power, tone and sensation normal throughout.        Assessment & Plan:  1. Hypertension : uncontrolled, adjustment in management, DASH diet , weight loss and increased activity 2.Pharngitis: no sign of infection, analgesics only 3.type 2 DM: lab data to be asesed 4. Obesity: lifestyle change to facilitate weight loss encouraged and discussed. 5. Carpal tunnel : refer for neurologic testing and management deemed necessary.

## 2010-10-12 ENCOUNTER — Encounter (INDEPENDENT_AMBULATORY_CARE_PROVIDER_SITE_OTHER): Payer: Medicare Other | Admitting: Psychiatry

## 2010-10-12 DIAGNOSIS — F4323 Adjustment disorder with mixed anxiety and depressed mood: Secondary | ICD-10-CM

## 2010-11-02 ENCOUNTER — Other Ambulatory Visit: Payer: Self-pay | Admitting: Family Medicine

## 2010-11-02 DIAGNOSIS — I10 Essential (primary) hypertension: Secondary | ICD-10-CM

## 2010-11-02 DIAGNOSIS — F329 Major depressive disorder, single episode, unspecified: Secondary | ICD-10-CM

## 2010-11-02 DIAGNOSIS — E785 Hyperlipidemia, unspecified: Secondary | ICD-10-CM

## 2010-11-02 DIAGNOSIS — K219 Gastro-esophageal reflux disease without esophagitis: Secondary | ICD-10-CM

## 2010-11-05 ENCOUNTER — Encounter: Payer: Self-pay | Admitting: Family Medicine

## 2010-11-09 ENCOUNTER — Encounter (INDEPENDENT_AMBULATORY_CARE_PROVIDER_SITE_OTHER): Payer: Medicare Other | Admitting: Psychiatry

## 2010-11-09 DIAGNOSIS — F4323 Adjustment disorder with mixed anxiety and depressed mood: Secondary | ICD-10-CM

## 2010-12-03 ENCOUNTER — Encounter (INDEPENDENT_AMBULATORY_CARE_PROVIDER_SITE_OTHER): Payer: Medicare Other | Admitting: Psychiatry

## 2010-12-03 DIAGNOSIS — F4323 Adjustment disorder with mixed anxiety and depressed mood: Secondary | ICD-10-CM

## 2010-12-04 NOTE — Procedures (Signed)
[name], [name]               ACCOUNT NO.:  [id number]   MEDICAL RECORD NO.:  [id number]          PATIENT TYPE:  OUT   LOCATION:  RAD                           FACILITY:  [facility]   PHYSICIAN:  Dani Gobble, MD       DATE OF BIRTH:  [birth year]   DATE OF PROCEDURE:  08/05/2007  DATE OF DISCHARGE:                                ECHOCARDIOGRAM   REFERRING PHYSICIAN:  Edward L. Juanetta Gosling, M.D., Dani Gobble, M.D.   INDICATION:  A 65 year old female with a past medical history of  diabetes and hypertension and pulmonary hypertension who is referred for  evaluation of LV and RV function.   Technical quality of the study is adequate.   The aorta measures normally at 2.9 cm.   Left atrium subjectively measures at the upper limits of normal at 4.0  cm but subjectively it appears mildly dilated.  The patient appeared to  be in sinus rhythm with frequent ectopic beats.   The interventricular septum and posterior wall are mildly to moderately  thickened.   The aortic valve is trileaflet, thin and pliable with normal leaflet  excursion.  Trace-to-mild aortic insufficiency is noted as a central  jet.  Doppler interrogation of the aortic valve is within normal limits.   The mitral valve appears grossly structurally normal.  Mild mitral  regurgitation is noted.  Doppler interrogation of the mitral valve is  within normal limits.   Pulmonic valve appears grossly structurally normal with trivial pulmonic  insufficiency.   Tricuspid valve appears structurally normal with mild tricuspid  regurgitation noted.  Estimated RVSP is approximately 36 mmHg.   The left ventricle is normal in size.  Overall left ventricular systolic  function appears normal although this is somewhat difficult to quantify  due to frequent ectopy.  In the apical views, the inferior and posterior  wall appeared to be hypokinetic, particularly in the distal apical  portions.  Again, overall ejection fraction appears to be  grossly  normal.   The right atrium is mildly dilated while the right ventricle is  moderately dilated with preserved right ventricular systolic function.   The main pulmonary artery subjectively appears to be dilated.   IMPRESSION:  1. Mild left atrial enlargement and mild-to-moderate right atrial      enlargement.  2. Mild-to-moderate concentric left ventricular hypertrophy.  3. Trace-to-mild central jet of aortic insufficiency.  4. Mild mitral regurgitation.  5. Trivial pulmonic insufficiency.  6. Mild tricuspid regurgitation.  7. Mild pulmonary hypertension.  8. Normal left ventricular size and preserved ejection fraction;      however, in the apical views the inferior and posterior wall appear      hypokinetic, particularly in the distal apical portions.  9. Moderate right ventricular enlargement with preserved right      ventricular systolic function.  10.Subjectively the main pulmonary artery appears dilated.           ______________________________  Dani Gobble, MD     AB/MEDQ  D:  08/05/2007  T:  08/05/2007  Job:  [id number]   cc:   Ramon Dredge L. Juanetta Gosling,  M.D.  Fax: [phone]

## 2010-12-05 ENCOUNTER — Other Ambulatory Visit: Payer: Self-pay | Admitting: Family Medicine

## 2010-12-07 NOTE — H&P (Signed)
NAMESELENY, [name]               ACCOUNT NO.:  [id number]   MEDICAL RECORD NO.:  [id number]          PATIENT TYPE:  AMB   LOCATION:  DAY                           FACILITY:  [facility]   PHYSICIAN:  [name], M.D.DATE OF BIRTH:  08-09-1945   DATE OF ADMISSION:  DATE OF DISCHARGE:  LH                                HISTORY & PHYSICAL   CHIEF COMPLAINT:  Pain, right knee.   This is a 65 year old female status post right total knee replacement, now  presents with left knee pain, stiffness, problems with activities of daily  living such as climbing stairs, getting in and out of a chair, and presents  for total knee replacement. Previously, she was managed with Celebrex and  Tylenol #3. She did well with her right knee and wishes to have the left  replaced. She understands the risks and benefits of the procedure and  understands her postoperative rehabilitation plan.   REVIEW OF SYSTEMS:  She has poor circulation, history of asthma, reflux,  diabetes, depression, poor vision, sinusitis.   PREVIOUS ILLNESSES:  None.   SURGERIES:  1.  Tubal ligation.  2.  Hysterectomy.  3.  Colostomy with closure  4.  Right total knee replacement.   FAMILY HISTORY:  Hypertension, diabetes, heart disease.   SOCIAL HISTORY:  Married. Does not smoke or drink. Highest grade completed  12.   PREVIOUS MEDICATIONS:  Prevacid, Zoloft, metformin, potassium, trazodone,  Avalide, Zyrtec, Cardizem, alprazolam, aspirin, vitamin C. We will update  these at the time of surgery.   PHYSICAL EXAMINATION:  VITAL SIGNS:  Weight is approximately 245 pounds,  pulse 76, respiratory rate 18.  GENERAL APPEARANCE:  Obese with ectomorphosis. Normal grooming, hygiene. No  deformity other than knee. She is well nourished as well.  CARDIOVASCULAR:  Mild swelling and varicosities. Pulse is normal.  Temperature is normal. No edema or tenderness.  LYMPH NODES:  Lymph nodes in the cervical spine are normal.  SKIN:  No  scar, rash, lesions, ulcers, or cafe' au lait spots are noted  other than her surgical scars including the right knee scar which is well  healed.  NEUROPSYCHIATRIC:  Coordination, reflexes, and sensation are intact. She is  alert and oriented x3 with no depression or agitation. Gait and station  reveal a limp. She has an antalgic left lower extremity gait. She walks well  with the right lower extremity. Range of motion shows 0 to 120 with median  compartment tenderness. The knee is stable. Extensor mechanism is intact.  There is no swelling.   IMPRESSION:  X-rays show degenerative arthritis of the left knee.   DIAGNOSIS:  Osteoarthritis, left knee, 715.16.   PLAN:  Left total knee replacement.     [name]   SEH/MEDQ  D:  05/31/2004  T:  05/31/2004  Job:  [id number]

## 2010-12-07 NOTE — Discharge Summary (Signed)
NAME:  [name], [name]                         ACCOUNT NO.:  [id number]   MEDICAL RECORD NO.:  [id number]                   PATIENT TYPE:  INP   LOCATION:  A337                                 FACILITY:  [facility]   PHYSICIAN:  Vickki Hearing, M.D.           DATE OF BIRTH:  04-08-1946   DATE OF ADMISSION:  11/29/2003  DATE OF DISCHARGE:  12/05/2003                                 DISCHARGE SUMMARY   SURGERY DATE:  11/29/2003   PROCEDURE:  Right total knee replacement.   ADMIT DIAGNOSIS:  Osteoarthritis right knee.   DISCHARGE DIAGNOSIS:  Osteoarthritis right knee.   HISTORY:  This is a 65 year old female with disabling right knee pain who  presented for right total knee replacement.   HOSPITAL COURSE:  The patient was admitted on the 10th; had an uncomplicated  right total knee replacement.  Postoperatively she went to the floor.  That  night she had decrease in blood pressure and had to be admitted to the unit.  After 24 hours she stabilized, was admitted back to the floor and had an  uncomplicated postoperative course.   She was discharged home on Vicodin 5 mg q.4h. #60 with 5 refills.  Follow up  visit scheduled for 12/14/2003 to have staples taken out.  She was  discharged with a CryoCuff, home health and home physical therapy from  [facility].   On discharge day she was afebrile, vital signs were stable. She was alert,  awaken and oriented.  Her knee wound looked good.  Her range of motion was  80 degrees, gait 350 feet.   DISPOSITION:  Home.   CONDITION:  Improved.   COMPLICATIONS:  None.     ___________________________________________                                         Vickki Hearing, M.D.   SEH/MEDQ  D:  12/09/2003  T:  12/09/2003  Job:  [id number]

## 2010-12-07 NOTE — Op Note (Signed)
NAME:  [name], [name]                         ACCOUNT NO.:  [id number]   MEDICAL RECORD NO.:  [id number]                   PATIENT TYPE:  AMB   LOCATION:  DAY                                  FACILITY:  [facility]   PHYSICIAN:  Vickki Hearing, M.D.           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   PREOPERATIVE DIAGNOSIS:  Osteoarthritis of the right knee.   POSTOPERATIVE DIAGNOSIS:  Osteoarthritis of the right knee.   PROCEDURE:  Right total knee arthroplasty.   SURGEON:  Vickki Hearing, M.D.   ANESTHESIA:  Spinal.   ANESTHETIST:  __________ and Johnney Killian, M.D.   COMPONENTS USED:  Stryker Scorpio total knee system with all size 5  components, 10-mm polyethylene insert.   FINDINGS:  Osteoarthritis, grade 4, medial and lateral compartments; grade  1, patellofemoral compartment.   DESCRIPTION OF PROCEDURE:  [name] was identified in the preoperative  holding area.  She marked her right knee as the surgical site, and I signed  my initials over it.  We also confirmed the operative site and the procedure  by history and physical and consent.  She was given preoperative Ancef and  taken to the operating room for a spinal anesthetic.  After this was  completed successfully, she was placed supine and the right leg was prepped  and draped using a sterile technique.  After draping was completed, a time  out was taken to confirm the patient's identify, procedure, and extremity.  Everyone in the surgical suite agreed, and we proceeded with knee  replacement.   An incision was made over the right knee and  centered over the patella.  It  was extended proximally and distally and deepened until the fascia was  reached.  The extensor mechanism was then incised, and a medial arthrotomy  was performed.  The patella was everted.  The medial soft tissue sleeve was  subperiosteally dissected from the tibia, and the ACL, PCL, and anterior  horns of both menisci were removed.  The retropatellar fat pad was partially  excised.  The knee was subluxated forward, and the tibial guide was set for  a neutral cut with 5 degree slope with 10 mm of bone being resected from the  lateral good side.   We then inserted a drill bit through the center of the femoral canal,  decompressed the canal, and did a 10-mm distal femoral cut followed by a  size 5 cutting block to perform the chamfer cuts and the anterior and  posterior cuts.  We then checked the flexion-extension gaps.  They were  balanced, and we proceeded to cut the femoral box cut.  Posterior  osteophytes were resected.  Soft tissue was removed, including the remaining  menisci.   The tibia sized to a size 5.  We placed the trials.  I was happy with the  flexion-extension gap balance and then did the tibial punch cut.  We  resected the patella from a 21 to 10 mm.  We drilled the peg holes.  We then  irrigated the knee.  We applied the tibia, femoral, and patellar components  and allowed the cement to cure.  We placed a 10-mm polyethylene tray,  checked the knee for range of motion and collateral ligament balance.  I was  happy with that and happy with the patellar tracking.   We then closed the knee with a Hemovac drain and an On-Q pain pump using #1  Bralon in interrupted fashion, 0 and 2-0 Vicryl, and skin staples.  We  applied a sterile dressing and a CryoCuff.  We released the tourniquet and  took the patient to the recovery room in stable condition.   Postoperative plan is for routine total knee protocol which will include  application of CPM on  postoperative day #1, start Lovenox on postoperative  day #1, continue antibiotics for 24 hours, out of bed with gait training  starting postoperative day #1 as well.  The patient is planning to go home.      ___________________________________________                                            Vickki Hearing, M.D.   SEH/MEDQ  D:  11/29/2003  T:  11/29/2003  Job:  [id number]

## 2010-12-07 NOTE — Consult Note (Signed)
NAME:  [name], [name]                         ACCOUNT NO.:  [id number]   MEDICAL RECORD NO.:  [id number]                   PATIENT TYPE:  INP   LOCATION:  A337                                 FACILITY:  [facility]   PHYSICIAN:  Vania Rea, M.D.              DATE OF BIRTH:  1946-04-12   DATE OF CONSULTATION:  11/30/2003  DATE OF DISCHARGE:                                   CONSULTATION   PRIMARY CARE PHYSICIAN:  Milus Mallick. Lodema Hong, M.D.   REASON FOR CONSULTATION:  A middle-age [place] American lady with multiple  medical problems admitted for right knee replacement requesting medical  service oversight.   HISTORY OF PRESENT ILLNESS:  This is a 65 year old African American lady  with no acute medical problems, who has severe degenerative arthritis of  both knees, is obese, and is admitted for right knee replacement and left  knee replacement to follow on interval. The patient has no acute complaints  at this time.   PAST MEDICAL HISTORY:  1. Type 2 diabetes.  2. Hypertension.  3. Hyperlipidemia.  4. Irregular heartbeat.  5. Depression.  6. Osteoarthritis.  7. Obesity.  8. Asthma.  9. GERD.   MEDICATIONS:  The patient does not have her entire medication list  presently, but her in hospital medication list includes Diltiazem,  cefazolin, Lovenox prophylactic dose, Dulcolax suppositories, milk of  magnesia daily, hydrochlorothiazide 12.5 mg daily, potassium 20 mg daily,  Avapro 300 mg daily, Toprol XL 25 mg daily, Vicodin q.4h., Claritin 10 mg  daily, Avandia 4 mg daily, Zoloft 75 mg daily, trazodone 150 mg daily,  Skelaxin 800 mg q.4h., Protonix 40 mg daily, Zocor 80 mg each evening. The  patient also has access to a PCA pump. She is receiving ringers lactate at  100 cc an hour.   ALLERGIES:  The patient has no known allergies.   SOCIAL HISTORY:  She lives with her husband of 34 years. She has two  children who have no medical problems. 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 She has never used tobacco, alcohol, or drugs.   FAMILY HISTORY:  Her father died in his 29s from an accident, but he  suffered with diabetes. Her mother is still alive and has hypertension,  diabetes, and heart disease. She is 65 years of age. She has three brothers  who all have hypertension and one of them is diabetic. Her children are all  in good health.   REVIEW OF SYSTEMS:  Significant for nasal allergies, severe pain in the  knees which prevent significant ambulation. When she does ambulate it is  short distances without the aid of a cane or walker; significant for a  negative Cardiolite stress test in November 2004; significant for completely  negative colonoscopy in either January or February 2005 by Dr. Katrinka Blazing;  significant for episodic shortness of breath when she has asthma, but  otherwise  specifically denies headaches, migraines, seizures, or any focal  weakness. Denies sinusitis, thyroid problems, or sore throat. Denies fever,  cough, or cold. Denies chest pain, orthopnea, PND, or lower extremity edema.  Denies palpitations. Denies nausea, vomiting, diarrhea, constipation, or  bloody stool. Denies hematuria, frequency, polyuria, polydipsia, or blood in  the urine. Has musculoskeletal problems related to her severe degenerative  arthritis. She is planning to attend [employer] to lose weight.   PHYSICAL EXAMINATION:  GENERAL: This is a very pleasant, obese African  American lady lying in bed.  VITAL SIGNS: Temperature 96.7, pulse 68, respirations 20, blood pressure  125/69. Fingerstick blood sugar before supper was 144.  HEENT: Her pupils are round, equal, and reactive to light. She is pink and  anicteric. She is not dehydrated. She has no oral candida. She has no  jugular venous distention. She has a thick neck.  CHEST: Clear to auscultation bilaterally. There is no prolonged expiration.  There is no wheezing.  CARDIOVASCULAR: Regular rhythm.  No murmurs or gallops.  ABDOMEN: Obese, soft, and nontender. There are no masses.  EXTREMITIES: Her right lower extremity is in postoperative cast. There is no  edema. There is no obvious tenderness. The left lower extremity is in a  sequential compression device. Pulses 2+. There is no edema.  CNS: She is alert and oriented times three. She has a mildly depressed  affect, but she is very pleasant. Her cranial nerves are intact. She has no  focal weakness and no sensory deficits identified, bearing in mind that her  right leg is immobilized.   LABORATORY DATA:  Her hemoglobin is 10.7, hematocrit 32.5. Sodium today is  129, potassium 4.8, chloride 98, CO2 25, glucose 152, BUN 9, creatinine 0.7,  and calcium 8.4. The patient has received one unit of packed red cells. Her  most recent hemoglobin was 11.7 with hematocrit of 35.2. Four days ago her  cholesterol was 190, LDL 113, triglycerides 72.  Hemoglobin A1C was 5.8.  Her PTT was 29, PT 11.8.  Her hepatic function, apart from albumin of 3.3,  was entirely normal. White cell count was 8, MCV 86.2, platelet count  191,000.   Chest x-ray done on Nov 28, 2003, showed cardiomegaly with chronic bronchitic  changes, no active infiltrates.   ASSESSMENT:  1. Obese middle age lady with severe degenerative joint disease, day one     status post right knee replacement, with a history of type 2 diabetes     that is very well controlled with a hemoglobin A1C of 5.8. We will     continue Avandia and continue  her on a sliding-scale regimen.  2. Hypertension. Her blood pressure is adequately controlled on Avapro,     HCTZ, and Toprol XL.  3. Hyperlipidemia. Her LDL control is inadequate, but she is on an 80 mg     dose of Zocor. We may consider switching to atorvastatin. However, we do     not know when she started this dose of Zocor. Her primary care physician     may wish to make any necessary adjustments. 4. Gives a history of irregular heartbeat  versus flutter. It is unclear what     this is in reference to. We will wait until we get her full medication     list. She says she is taking something for this.  5. Depression. Seems to be a very pleasant lady. She is on Zoloft and     trazodone. We will defer  any further changes to her primary care     physician if she wishes to continue her on two separate medications or     unify to one medication.  6. Osteoarthritis. The patient is being managed with Vicodin and has had a     knee replacement. There is no need for any intervention.  7. Obesity. She has been encouraged to follow her desire to sign up with     Weight Watchers or some other well established weight management program     as this will assist in the management of all her medical problems, in     particular osteoarthritis.  8. Asthma is quiescent and there is no need for intervention at this time.  9. Gastroesophageal reflux disease is quiescent. She is on Protonix 40 mg     daily and this is considered adequate therapy.  10.      For nasal allergies, she is receiving Claritin and has no     complaints at this time.   The patient seems to be adequately managed at this time and there is no need  for any active intervention.   I would like to thank Dr. Romeo Apple for allowing me to participate in the  care of this very pleasant lady. We will make brief visits each day while  the patient is in the hospital to ensure that her management remains on  track.      ___________________________________________                                            Vania Rea, M.D.   LC/MEDQ  D:  11/30/2003  T:  11/30/2003  Job:  [id number]

## 2010-12-07 NOTE — Group Therapy Note (Signed)
NAME:  [name], [name]                         ACCOUNT NO.:  [id number]   MEDICAL RECORD NO.:  [id number]                   PATIENT TYPE:  INP   LOCATION:  A337                                 FACILITY:  [facility]   PHYSICIAN:  Vickki Hearing, M.D.           DATE OF BIRTH:  16-Feb-1946   DATE OF PROCEDURE:  DATE OF DISCHARGE:                                   PROGRESS NOTE   SUBJECTIVE:  She is status post knee replacement, right lower extremity.  She is postoperative day #2.  She has a hemoglobin of 10.3.  She was  transferred from the unit back to the floor.  She went to the unit for low  blood pressure.  It responded to fluids and a blood transfusion.  She is  comfortable, stable, minimal pain, neurovascularly intact.  She should be  out of bed, and gait training should start today.  She is stable.      ___________________________________________                                            Vickki Hearing, M.D.   SEH/MEDQ  D:  12/01/2003  T:  12/01/2003  Job:  [id number]

## 2010-12-07 NOTE — Discharge Summary (Signed)
[name], Wagner               ACCOUNT NO.:  [id number]   MEDICAL RECORD NO.:  [id number]          PATIENT TYPE:  INP   LOCATION:  A310                          FACILITY:  [facility]   PHYSICIAN:  Vickki Hearing, M.D.DATE OF BIRTH:  1946/06/13   DATE OF ADMISSION:  06/01/2004  DATE OF DISCHARGE:  11/16/2005LH                                 DISCHARGE SUMMARY   ADMISSION DIAGNOSES:  Osteoarthritis, left knee.   DISCHARGE DIAGNOSES:  Osteoarthritis, right knee.   HISTORY OF PRESENT ILLNESS:  A 65 year old female status post right knee  replacement, presented with left knee pain and stiffness and problems with  activities of daily living. She had failed management with Celebrex, Tylenol  #3 and supportive device. She presented for left total knee replacement.   HOSPITAL COURSE:  On the day of admission, June 06, 2004, she underwent  an uncomplicated left knee replacement with a Scorpio Stryker posterior  stabilized total knee. She tolerated that well under a spinal anesthetic.  She went back to her room and had an unremarkable postoperative course  except for hemoglobin, which diminished to 8.5. This caused her to have some  weakness and trouble with her physical therapy, requiring her to stay an  extra 2 days to regain her strength and improve with physical therapy. At  the time of discharge, the patient was in stable condition. Her ambulation  was noted to be 40 feet with standby assist. Her range of motion was 0 to 95  degrees. She had minimal assist for short quads and straight leg raises.   CONDITION ON DISCHARGE:  Improved on discharge.   DISCHARGE MEDICATIONS:  1.  Vicodin 5 mg q. 4 p.r.n. for pain.  2.  Resume all preoperative and pre-admission medications.  3.  Lovenox as well for deep vein thrombosis prevention.   FOLLOW UP:  Scheduled for 1 week for staple removal.   SPECIAL INSTRUCTIONS:  Home health and physical therapy.   ACTIVITY:  Weight bearing as  tolerated.     [name]   SEH/MEDQ  D:  06/12/2004  T:  06/12/2004  Job:  [id number]

## 2010-12-07 NOTE — Group Therapy Note (Signed)
[name], [name]               ACCOUNT NO.:  [id number]   MEDICAL RECORD NO.:  [id number]          PATIENT TYPE:  INP   LOCATION:  A310                          FACILITY:  [facility]   PHYSICIAN:  Vickki Hearing, M.D.DATE OF BIRTH:  August 21, 1945   DATE OF PROCEDURE:  DATE OF DISCHARGE:                                   PROGRESS NOTE   PROGRESS NOTE:  Postoperative day #1, status post left total knee  replacement.  The patient appears to be comfortable in bed with PCA.  Hemoglobin is 10.4.  Today we need to get her out of bed and make sure she  uses her incentive spirometer.  Recheck her labs tomorrow, and start more  aggressive gait training tomorrow.     [name]   SEH/MEDQ  D:  06/02/2004  T:  06/02/2004  Job:  [id number]

## 2010-12-07 NOTE — Group Therapy Note (Signed)
NAME:  [name], [name]                         ACCOUNT NO.:  [id number]   MEDICAL RECORD NO.:  [id number]                  PATIENT TYPE:   LOCATION:                                       FACILITY:  [facility]   PHYSICIAN:  Vickki Hearing, M.D.           DATE OF BIRTH:  1945/11/22   DATE OF PROCEDURE:  DATE OF DISCHARGE:                             HISTORY & PHYSICAL UPDATE   Update history and physical.   CHIEF COMPLAINT:  Pain in the right knee.   HISTORY OF PRESENT ILLNESS:  This is a 65 year old female with bilateral  knee pain, stiffness, and increasing pain with activities of daily living,  who has had no major change in her condition since March 2005.  She will  have a right total knee replacement done on Nov 29, 2003.      ___________________________________________                                            Vickki Hearing, M.D.   SEH/MEDQ  D:  11/28/2003  T:  11/28/2003  Job:  [id number]

## 2010-12-07 NOTE — Op Note (Signed)
[name], [name]               ACCOUNT NO.:  [id number]   MEDICAL RECORD NO.:  [id number]          PATIENT TYPE:  INP   LOCATION:  A310                          FACILITY:  [facility]   PHYSICIAN:  Vickki Hearing, M.D.DATE OF BIRTH:  1946-03-16   DATE OF PROCEDURE:  06/01/2004  DATE OF DISCHARGE:                                 OPERATIVE REPORT   PREOPERATIVE DIAGNOSIS:  Osteoarthritis, left knee.   POSTOPERATIVE DIAGNOSIS:  Osteoarthritis, left knee.   PROCEDURE:  Left total knee replacement. [06/01/2004]   IMPLANTS USED:  Stryker Osteonics 5 tibia, 5 femur, 5 patella, 10 mm  polyethylene, Scorpio posterior-stabilized knee.   SURGEON:  Vickki Hearing, M.D.   SPECIMENS:  Bone.   ESTIMATED BLOOD LOSS:  50 mL.   COMPLICATIONS:  None.   One Hemovac drain in the joint, one pain pump catheter in the subcu space.   HISTORY:  A 65 year old female, status post right total knee replacement.  Presented for left total knee replacement because of pain and poor function  with activities of daily living causing difficulty and pain unresponsive to  anti-inflammatories, supportive devices, and narcotic pain medicine.   OPERATIVE FINDINGS:  Severe osteoarthritis of the knee with varus deformity.   DETAILS OF PROCEDURE:  [name] was identified in the preop holding area.  Her medical record was reviewed.  She placed a mark over her left knee as  the surgical site, and I signed the left knee as the surgical site.  We gave  her preoperative antibiotics approximately one hour prior to skin incision.  We took her to the operating room, where she had a spinal anesthetic.  She  was placed supine on the operating table.  The left thigh was wrapped with  Webril proximally and the tourniquet was applied.  After sterile prep and  drape, the time-out was taken as required and everyone agreed on the  procedure, extremity, and all implants were available for use.  Antibiotics  had been given  one hour prior to skin incision.   The leg was exsanguinated with a six-inch Esmarch.  The tourniquet was  elevated to 300 mmHg, where it stayed for 1 hour 27 minutes.   An incision was made centered over the patella, extended proximally and  distally down to the extensor mechanism.  An arthrotomy was performed  medially, the patella was everted, the knee was flexed.  ACL, PCL, and  medial and lateral menisci were excised.  The medial soft tissue sleeve was  elevated.  Osteophytes were removed from the medial tibial rim.  The tibial  guide was set for a neutral cut.  The stylus was set at 10, placed on the  lateral side, and a 10 mm proximal cut was made.  The tibia measured a size  5.   A 3/8 inch drill bit was drilled through the femoral canal, and the canal  was decompressed with irrigation, suction, and a fluted guide rod.  A 10 mm  distal cutting block was then pinned to the distal femur.  Ten millimeters  of bone was resected from the  distal femur.  The femur was then sized to a  size 6, so we pinned at size 7 and put a 5 cutting block and made the four  cuts with the 5 cutting block.  At this point flexion and extension gaps  were checked.  We sized for a 10 mm insert.  The gaps were equal.  We then  made the box cut.  We then placed the trials.  The trials fit well.  We then  punched the tibia, cut the patella from a 20 down to a 9, placed size 5  dome lollipop, and drilled the three PEG holes.  We then irrigated the knee,  cemented the prosthesis in place, checked range of motion and stability,  found it to be good with a size 10, put a 10 mm polyethylene insert in  place, irrigated the knee, removed excess cement, placed the Hemovac drain,  closed the joint capsule and extensor mechanism with #1 Bralon, placed the  pain pump catheter, closed the subcu space with 0 and 2-0 Vicryl.  We placed  staples, sterile bandages, CryoCuff, hooked up the pain pump, and gave her  one dose  and continued it and allowed it to continue to run.   She was taken to the recovery room in stable condition.   Postop plan as usual total knee replacement.     [name]   SEH/MEDQ  D:  06/01/2004  T:  06/01/2004  Job:  [id number]

## 2010-12-07 NOTE — Group Therapy Note (Signed)
NAME:  [name], SPELLMAN                         ACCOUNT NO.:  [id number]   MEDICAL RECORD NO.:  [id number]                   PATIENT TYPE:  OUT   LOCATION:  RAD                                  FACILITY:  [facility]   PHYSICIAN:  Vania Rea, M.D.              DATE OF BIRTH:  01/20/1946   DATE OF PROCEDURE:  DATE OF DISCHARGE:  11/28/2003                                   PROGRESS NOTE   SUBJECTIVE:  Feels good, not having much pain, coughing from an upper  respiratory infection, and the room seems a little cold.   OBJECTIVE:  Vitals 98.6, pulse 75, respirations 18, blood pressure 93/47,  fasting blood sugar is 136, pre-lunch blood sugar was 125.  Chest is clear  to auscultation bilaterally.  Abdomen is soft, nontender.  She has no calf  tenderness.  Her white count is 12.4, hematocrit 30.9, platelet count 156.  Sodium is increased to 132, potassium 4.0, chloride 100, cO2 29.  Glucose  116.  BUN 14, creatinine 1.1.  Calcium is 8.0.   ASSESSMENT:  1. Hypertension, controlled.  2. Diabetes mellitus adequate control.  3. Blood pressure on the low side, but the patient has no symptoms of     inadequate _________ or other organic __________ perfusion.  Seems to be     having an upper respiratory infection.  We will add some Robitussin and     nebulizations p.r.n.      ___________________________________________                                            Vania Rea, M.D.   LC/MEDQ  D:  12/01/2003  T:  12/01/2003  Job:  [id number]

## 2010-12-07 NOTE — Procedures (Signed)
[name], [name]               ACCOUNT NO.:  [id number]   MEDICAL RECORD NO.:  [id number]          PATIENT TYPE:  OUT   LOCATION:  RESP                          FACILITY:  [facility]   PHYSICIAN:  Edward L. Juanetta Gosling, M.D.DATE OF BIRTH:  1946-01-07   DATE OF PROCEDURE:  DATE OF DISCHARGE:  11/20/2006                            PULMONARY FUNCTION TEST   1. Spirometry shows a mild ventilatory defect without definite      evidence of airflow obstruction except perhaps in the smaller      airways.  2. Lung volumes show mild reduction in total lung capacity.  3. DLCO is normal.  4. Arterial blood gases were normal.      Edward L. Juanetta Gosling, M.D.  Electronically Signed     ELH/MEDQ  D:  11/24/2006  T:  11/25/2006  Job:  [id number]   cc:   Milus Mallick. Lodema Hong, M.D.  Fax: [phone]

## 2010-12-24 ENCOUNTER — Encounter (HOSPITAL_COMMUNITY): Payer: Medicare Other | Admitting: Psychiatry

## 2010-12-31 ENCOUNTER — Encounter (INDEPENDENT_AMBULATORY_CARE_PROVIDER_SITE_OTHER): Payer: Medicare Other | Admitting: Psychiatry

## 2010-12-31 DIAGNOSIS — F4323 Adjustment disorder with mixed anxiety and depressed mood: Secondary | ICD-10-CM

## 2011-01-30 ENCOUNTER — Encounter (INDEPENDENT_AMBULATORY_CARE_PROVIDER_SITE_OTHER): Payer: Medicare Other | Admitting: Psychiatry

## 2011-01-30 DIAGNOSIS — F4323 Adjustment disorder with mixed anxiety and depressed mood: Secondary | ICD-10-CM

## 2011-02-12 ENCOUNTER — Encounter: Payer: Self-pay | Admitting: Family Medicine

## 2011-02-14 ENCOUNTER — Encounter: Payer: Self-pay | Admitting: Family Medicine

## 2011-02-14 ENCOUNTER — Ambulatory Visit (INDEPENDENT_AMBULATORY_CARE_PROVIDER_SITE_OTHER): Payer: Medicare Other | Admitting: Family Medicine

## 2011-02-14 VITALS — BP 150/80 | HR 77 | Resp 16 | Ht 61.5 in | Wt 195.0 lb

## 2011-02-14 DIAGNOSIS — E119 Type 2 diabetes mellitus without complications: Secondary | ICD-10-CM

## 2011-02-14 DIAGNOSIS — I10 Essential (primary) hypertension: Secondary | ICD-10-CM

## 2011-02-14 DIAGNOSIS — E785 Hyperlipidemia, unspecified: Secondary | ICD-10-CM

## 2011-02-14 DIAGNOSIS — Z13820 Encounter for screening for osteoporosis: Secondary | ICD-10-CM

## 2011-02-14 DIAGNOSIS — I272 Pulmonary hypertension, unspecified: Secondary | ICD-10-CM | POA: Insufficient documentation

## 2011-02-14 DIAGNOSIS — R5383 Other fatigue: Secondary | ICD-10-CM

## 2011-02-14 DIAGNOSIS — I2789 Other specified pulmonary heart diseases: Secondary | ICD-10-CM

## 2011-02-14 DIAGNOSIS — R5381 Other malaise: Secondary | ICD-10-CM

## 2011-02-14 DIAGNOSIS — E669 Obesity, unspecified: Secondary | ICD-10-CM

## 2011-02-14 LAB — CBC WITH DIFFERENTIAL/PLATELET
Eosinophils Absolute: 0.2 10*3/uL (ref 0.0–0.7)
Lymphs Abs: 3 10*3/uL (ref 0.7–4.0)
MCH: 29.6 pg (ref 26.0–34.0)
Neutrophils Relative %: 50 % (ref 43–77)
Platelets: 155 10*3/uL (ref 150–400)
RBC: 4.66 MIL/uL (ref 3.87–5.11)
WBC: 7.8 10*3/uL (ref 4.0–10.5)

## 2011-02-14 LAB — HEMOGLOBIN A1C: Hgb A1c MFr Bld: 5.9 % — ABNORMAL HIGH (ref <5.7)

## 2011-02-14 LAB — LIPID PANEL
LDL Cholesterol: 128 mg/dL — ABNORMAL HIGH (ref 0–99)
Triglycerides: 61 mg/dL (ref <150)

## 2011-02-14 MED ORDER — AMLODIPINE BESYLATE 2.5 MG PO TABS: 2.5000 mg | ORAL_TABLET | Freq: Every day | ORAL | Status: DC

## 2011-02-14 NOTE — Assessment & Plan Note (Signed)
Elevated RV pressure, has not been to pulmonary for over 1 year, referral made

## 2011-02-14 NOTE — Patient Instructions (Addendum)
F/u in 2 months.  Labs today.  You are being referred to Dr Juanetta Gosling for f/u on your lungs.  pls call and go to class at the hospital for diabetics  A healthy diet is rich in fruit, vegetables and whole grains. Poultry fish, nuts and beans are a healthy choice for protein rather then red meat. A low sodium diet and drinking 64 ounces of water daily is generally recommended. Oils and sweet should be limited. Carbohydrates especially for those who are diabetic or overweight, should be limited to 34-45 gram per meal. It is important to eat on a regular schedule, at least 3 times daily. Snacks should be primarily fruits, vegetables or nuts.   It is important that you exercise regularly at least 30 minutes 5 times a week. If you develop chest pain, have severe difficulty breathing, or feel very tired, stop exercising immediately and seek medical attention   Goal weight loss goal is 5 pounds

## 2011-02-14 NOTE — Assessment & Plan Note (Signed)
Lab due, generally well controlled, dietary change discussed to facilitate weight loss

## 2011-02-14 NOTE — Assessment & Plan Note (Signed)
Medication compliance addressed. Commitment to regular exercise and healthy  food choices, with portion control discussed. DASH diet and low fat diet discussed and literature offered. Changes in medication made at this visit.  

## 2011-02-14 NOTE — Progress Notes (Signed)
  Subjective:    Patient ID: [name], female    DOB: [birth year], 65 y.o.   MRN: [id number]  HPI HYPERTENSION Disease Monitoring Blood pressure range-systolic 140 to 150/80 Chest pain- no      Dyspnea- no Medications Compliance- good Lightheadedness- no   Edema- no   DIABETES Disease Monitoring Blood Sugar ranges-fasting 110 to 120 Polyuria- no New Visual problems- no Medications Compliance- good Hypoglycemic symptoms- no   HYPERLIPIDEMIA Disease Monitoring See symptoms for Hypertension Medications Compliance- good RUQ pain- no  Muscle aches- no    Review of Systems Denies recent fever or chills. Denies sinus pressure, nasal congestion, ear pain or sore throat. Denies chest congestion, productive cough or wheezing. Denies chest pains, palpitations, paroxysmal nocturnal dyspnea, orthopnea and leg swelling. Recent echocardiogram shows elevated Right ventricular pressure Denies abdominal pain, nausea, vomiting,diarrhea or constipation.  Denies rectal bleeding or change in bowel movement. Denies dysuria, frequency, hesitancy or incontinence.  Denies headaches, seizure, numbness, or tingling. Denies uncontrolled  depression, anxiety or insomnia.Still goes to therapy regularly Denies skin break down or rash.        Objective:   Physical Exam Patient alert and oriented and in no Cardiopulmonary distress.  HEENT: No facial asymmetry, EOMI, no sinus tenderness, TM's clear, Oropharynx pink and moist.  Neck supple no adenopathy.  Chest: Clear to auscultation bilaterally.  CVS: S1, S2 no murmurs, no S3.  ABD: Soft non tender. Bowel sounds normal.  Ext: No edema  MS: Adequate ROM spine, shoulders, hips and knees.  Skin: Intact, no ulcerations or rash noted.  Psych: Good eye contact, normal affect. Memory intact not anxious or depressed appearing.  CNS: CN 2-12 intact, power, tone and sensation normal throughout. Diabetic Foot Check:  Appearance - no l ulcers  ,calluses present Skin - no unusual pallor or redness Sensation - grossly intact to light touch Monofilament testing -  Right - Great toe, medial, central, lateral ball and posterior foot diminished Left - Great toe, medial, central, lateral ball and posterior foot diminished Pulses Left - Dorsalis Pedis and Posterior Tibia normal Right - Dorsalis Pedis and Posterior Tibia normal        Assessment & Plan:

## 2011-02-14 NOTE — Assessment & Plan Note (Signed)
Fasting labs past due will be done asap. Low fat diet discussed and encouraged

## 2011-02-14 NOTE — Assessment & Plan Note (Signed)
unchanged Patient re-educated about  the importance of commitment to a  minimum of 150 minutes of exercise per week. The importance of healthy food choices with portion control discussed. Encouraged to start a food diary, count calories and to consider  joining a support group. Sample diet sheets offered. Goals set by the patient for the next several months.    

## 2011-02-15 LAB — COMPLETE METABOLIC PANEL WITH GFR
ALT: <8 U/L (ref 0–35)
Albumin: 3.9 g/dL (ref 3.5–5.2)
CO2: 28 mEq/L (ref 19–32)
Calcium: 9.2 mg/dL (ref 8.4–10.5)
Chloride: 106 mEq/L (ref 96–112)
GFR, Est African American: >60 mL/min (ref >60)
Sodium: 142 mEq/L (ref 135–145)
Total Protein: 6.9 g/dL (ref 6.0–8.3)

## 2011-02-27 ENCOUNTER — Encounter (INDEPENDENT_AMBULATORY_CARE_PROVIDER_SITE_OTHER): Payer: Medicare Other | Admitting: Psychiatry

## 2011-02-27 DIAGNOSIS — F4323 Adjustment disorder with mixed anxiety and depressed mood: Secondary | ICD-10-CM

## 2011-03-27 ENCOUNTER — Encounter (INDEPENDENT_AMBULATORY_CARE_PROVIDER_SITE_OTHER): Payer: Medicare Other | Admitting: Psychiatry

## 2011-03-27 DIAGNOSIS — F4323 Adjustment disorder with mixed anxiety and depressed mood: Secondary | ICD-10-CM

## 2011-04-18 ENCOUNTER — Encounter: Payer: Self-pay | Admitting: Family Medicine

## 2011-04-19 ENCOUNTER — Ambulatory Visit (INDEPENDENT_AMBULATORY_CARE_PROVIDER_SITE_OTHER): Payer: Medicare Other | Admitting: Family Medicine

## 2011-04-19 ENCOUNTER — Encounter: Payer: Self-pay | Admitting: Family Medicine

## 2011-04-19 VITALS — BP 150/80 | HR 73 | Resp 16 | Ht 61.5 in | Wt 194.0 lb

## 2011-04-19 DIAGNOSIS — F329 Major depressive disorder, single episode, unspecified: Secondary | ICD-10-CM

## 2011-04-19 DIAGNOSIS — E119 Type 2 diabetes mellitus without complications: Secondary | ICD-10-CM

## 2011-04-19 DIAGNOSIS — E669 Obesity, unspecified: Secondary | ICD-10-CM

## 2011-04-19 DIAGNOSIS — I10 Essential (primary) hypertension: Secondary | ICD-10-CM

## 2011-04-19 DIAGNOSIS — J45909 Unspecified asthma, uncomplicated: Secondary | ICD-10-CM

## 2011-04-19 DIAGNOSIS — E785 Hyperlipidemia, unspecified: Secondary | ICD-10-CM

## 2011-04-19 NOTE — Patient Instructions (Signed)
F/u early December.  LABWORK  NEEDS TO BE DONE BETWEEN 3 TO 7 DAYS BEFORE YOUR NEXT SCEDULED  VISIT.  THIS WILL IMPROVE THE QUALITY OF YOUR CARE. Fasting labs lipid, hBA1C , cmp and microalbuminuria  Remember to take niaspan also

## 2011-04-19 NOTE — Assessment & Plan Note (Signed)
Stable and controlled

## 2011-04-19 NOTE — Assessment & Plan Note (Signed)
Hyperlipidemia:Low fat diet discussed and encouraged.  Uncontrolled, rept labs for next visit

## 2011-04-19 NOTE — Progress Notes (Signed)
  Subjective:    Patient ID: JANEVA PEASTER, female    DOB: 1945/12/17, 65 y.o.   MRN: [id number]  HPI The PT is here for follow up and re-evaluation of chronic medical conditions, medication management and review of any available recent lab and radiology data.  Preventive health is updated, specifically  Cancer screening and Immunization.   Questions or concerns regarding consultations or procedures which the PT has had in the interim are  addressed. The PT denies any adverse reactions to current medications since the last visit.  2 day h/o left ear pressure Tests blood sugars on avg 2 times weekly and fasting sugars are seldom over 110    Review of Systems Denies recent fever or chills. Denies sinus pressure, nasal congestion, or sore throat. Denies chest congestion, productive cough or wheezing. Denies chest pains, palpitations and leg swelling Denies abdominal pain, nausea, vomiting,diarrhea or constipation.   Denies dysuria, frequency, hesitancy or incontinence. Denies joint pain, swelling and limitation in mobility. Denies headaches, seizures, numbness, or tingling. Denies uncontrolled  depression, anxiety or insomnia. Denies skin break down or rash.        Objective:   Physical Exam  Patient alert and oriented and in no cardiopulmonary distress.  HEENT: No facial asymmetry, EOMI, no sinus tenderness,  oropharynx pink and moist.  Neck supple no adenopathy.  Chest: Clear to auscultation bilaterally.  CVS: S1, S2 no murmurs, no S3.  ABD: Soft non tender. Bowel sounds normal.  Ext: No edema  MS: Adequate ROM spine, shoulders, hips and knees.  Skin: Intact, no ulcerations or rash noted.  Psych: Good eye contact, normal affect. Memory intact not anxious or depressed appearing.  CNS: CN 2-12 intact, power, tone and sensation normal throughout.       Assessment & Plan:

## 2011-04-19 NOTE — Assessment & Plan Note (Signed)
Improved, sees therapist regularly, pt to continue current meds

## 2011-04-19 NOTE — Assessment & Plan Note (Signed)
Uncontrolled,however medications will be unchanged, low sodium diet, continued exercise, recently started water aerobics and weight loss encouraged and discussed

## 2011-05-07 ENCOUNTER — Encounter (INDEPENDENT_AMBULATORY_CARE_PROVIDER_SITE_OTHER): Payer: Medicare Other | Admitting: Psychiatry

## 2011-05-07 DIAGNOSIS — F4323 Adjustment disorder with mixed anxiety and depressed mood: Secondary | ICD-10-CM

## 2011-05-08 ENCOUNTER — Other Ambulatory Visit: Payer: Self-pay | Admitting: Family Medicine

## 2011-05-23 ENCOUNTER — Telehealth: Payer: Self-pay | Admitting: Family Medicine

## 2011-06-03 MED ORDER — GLUCOSE BLOOD VI STRP: ORAL_STRIP | Status: DC

## 2011-06-03 MED ORDER — ACCU-CHEK AVIVA PLUS W/DEVICE KIT: 1.0000 | PACK | Freq: Once | Status: DC

## 2011-06-03 MED ORDER — ACCU-CHEK MULTICLIX LANCETS MISC: Status: AC

## 2011-06-03 NOTE — Telephone Encounter (Signed)
Sent in

## 2011-06-08 ENCOUNTER — Other Ambulatory Visit: Payer: Self-pay | Admitting: Family Medicine

## 2011-06-25 LAB — COMPLETE METABOLIC PANEL WITH GFR
Albumin: 4 g/dL (ref 3.5–5.2)
BUN: 15 mg/dL (ref 6–23)
CO2: 27 mEq/L (ref 19–32)
Calcium: 9.4 mg/dL (ref 8.4–10.5)
Chloride: 107 mEq/L (ref 96–112)
Creat: 0.93 mg/dL (ref 0.50–1.10)
GFR, Est African American: 76 mL/min (ref >60)
GFR, Est Non African American: 66 mL/min (ref >60)
Glucose, Bld: 86 mg/dL (ref 70–99)

## 2011-06-25 LAB — MICROALBUMIN / CREATININE URINE RATIO
Creatinine, Urine: 163.2 mg/dL
Microalb, Ur: 0.5 mg/dL (ref 0.00–1.89)

## 2011-06-25 LAB — LIPID PANEL
Cholesterol: 168 mg/dL (ref 0–200)
Triglycerides: 35 mg/dL (ref <150)
VLDL: 7 mg/dL (ref 0–40)

## 2011-06-26 ENCOUNTER — Encounter: Payer: Self-pay | Admitting: Family Medicine

## 2011-06-27 ENCOUNTER — Encounter: Payer: Self-pay | Admitting: Family Medicine

## 2011-06-27 ENCOUNTER — Ambulatory Visit (INDEPENDENT_AMBULATORY_CARE_PROVIDER_SITE_OTHER): Payer: Medicare Other | Admitting: Family Medicine

## 2011-06-27 VITALS — BP 118/78 | HR 60 | Resp 16 | Ht 61.5 in | Wt 195.8 lb

## 2011-06-27 DIAGNOSIS — I10 Essential (primary) hypertension: Secondary | ICD-10-CM

## 2011-06-27 DIAGNOSIS — G5600 Carpal tunnel syndrome, unspecified upper limb: Secondary | ICD-10-CM

## 2011-06-27 DIAGNOSIS — F329 Major depressive disorder, single episode, unspecified: Secondary | ICD-10-CM

## 2011-06-27 DIAGNOSIS — M79609 Pain in unspecified limb: Secondary | ICD-10-CM

## 2011-06-27 DIAGNOSIS — M79671 Pain in right foot: Secondary | ICD-10-CM

## 2011-06-27 DIAGNOSIS — E119 Type 2 diabetes mellitus without complications: Secondary | ICD-10-CM

## 2011-06-27 DIAGNOSIS — M79672 Pain in left foot: Secondary | ICD-10-CM | POA: Insufficient documentation

## 2011-06-27 DIAGNOSIS — G5602 Carpal tunnel syndrome, left upper limb: Secondary | ICD-10-CM

## 2011-06-27 DIAGNOSIS — E785 Hyperlipidemia, unspecified: Secondary | ICD-10-CM

## 2011-06-27 NOTE — Patient Instructions (Signed)
F/u in 4 months.  Labs are excellent.  Stop metformin, continue diabetic diet  You are referred about your left hand and right foot.  Pls resume water aerobics and regular exercise, also the [facility] should be wonderful for you since you want to go   hBa1C in 4 months

## 2011-06-27 NOTE — Progress Notes (Signed)
  Subjective:    Patient ID: [name], female    DOB: 05/06/1946, 65 y.o.   MRN: [id number]  HPI Progressive weakness and numbness in left hand, has carpal tunnel, wants to wait on surgical consultation till January. Increased low back painesp sfter sitting for a while, difficulty standing, does not keep her up.  Painful area under right foot, as though something is in it for the past 1 month  Improved mental health, learning to be less intimidated and overwhelmed by his spouse    Review of Systems See HPI Denies recent fever or chills. Denies sinus pressure, nasal congestion, ear pain or sore throat. Denies chest congestion, productive cough or wheezing. Denies chest pains, palpitations and leg swelling Denies abdominal pain, nausea, vomiting,diarrhea or constipation.   Denies dysuria, frequency, hesitancy or incontinence.  Denies headaches,or  seizures. Denies depression, anxiety or insomnia. Denies skin break down or rash.        Objective:   Physical Exam Patient alert and oriented and in no cardiopulmonary distress.  HEENT: No facial asymmetry, EOMI, no sinus tenderness,  oropharynx pink and moist.  Neck supple no adenopathy.  Chest: Clear to auscultation bilaterally.  CVS: S1, S2 no murmurs, no S3.  ABD: Soft non tender. Bowel sounds normal.  Ext: No edema  MS: Adequate ROM spine, shoulders, hips and knees.Positive tinel's sign left hand  Skin: Intact, no ulcerations or rash noted.Painful callus sole of right foot  Psych: Good eye contact, normal affect. Memory intact not anxious or depressed appearing.  CNS: CN 2-12 intact, power, tone and sensation normal throughout.        Assessment & Plan:

## 2011-06-28 NOTE — Assessment & Plan Note (Signed)
Improved, will be diet controlled only at this time

## 2011-06-28 NOTE — Assessment & Plan Note (Signed)
Deteriorated, will refer to orthopedics for January appt

## 2011-06-28 NOTE — Assessment & Plan Note (Signed)
Painful lesion on sole of right foot, will refer to podiatry

## 2011-06-28 NOTE — Assessment & Plan Note (Signed)
Controlled, no change in medication  

## 2011-06-28 NOTE — Assessment & Plan Note (Signed)
Improved, continue meds, no longer in therapy and doing well

## 2011-07-30 ENCOUNTER — Ambulatory Visit (INDEPENDENT_AMBULATORY_CARE_PROVIDER_SITE_OTHER): Payer: Medicare Other | Admitting: Orthopedic Surgery

## 2011-07-30 ENCOUNTER — Encounter: Payer: Self-pay | Admitting: Orthopedic Surgery

## 2011-07-30 VITALS — BP 120/80 | Ht 61.5 in | Wt 195.0 lb

## 2011-07-30 DIAGNOSIS — G5600 Carpal tunnel syndrome, unspecified upper limb: Secondary | ICD-10-CM

## 2011-07-30 MED ORDER — PYRIDOXINE HCL 100 MG PO TABS: 100.0000 mg | ORAL_TABLET | Freq: Two times a day (BID) | ORAL | Status: AC

## 2011-07-30 MED ORDER — GABAPENTIN 100 MG PO CAPS: 100.0000 mg | ORAL_CAPSULE | Freq: Three times a day (TID) | ORAL | Status: DC

## 2011-07-30 NOTE — Patient Instructions (Signed)
Start new meds   Wear brace   Return in 2 months

## 2011-07-31 ENCOUNTER — Encounter: Payer: Self-pay | Admitting: Orthopedic Surgery

## 2011-07-31 DIAGNOSIS — G5602 Carpal tunnel syndrome, left upper limb: Secondary | ICD-10-CM | POA: Insufficient documentation

## 2011-07-31 NOTE — Progress Notes (Signed)
Patient ID: [name], female   DOB: 08/31/1945, 66 y.o.   MRN: [id number] Chief Complaint  Patient presents with  . Wrist Problem    carpal tunnel syndrome left wrist   This is a 66 year old female who had a carpal tunnel tests which showed LEFT greater than RIGHT moderate bilateral carpal tunnel syndrome.  She's was treated with a brace  She complains of numbness tingling weakness loss of grip with pain radiating into her forearm described as severe.  Review of systems recorded by the patient and reviewed by the doctor.  Please review the scan documents  Physical Exam(12) GENERAL: normal development   CDV: pulses are normal   Skin: normal  Lymph: nodes were not palpable/normal  Psychiatric: awake, alert and oriented  Neuro: normal sensation  MSK Ambulation is normal 1 Inspection and palpation of both wrists reveal no tenderness over the carpal tunnel 2 Range of motion is full 3 Grip strength is normal and equal 4 Both wrists are stable 5 The LEFT wrist has a positive Phalen sign at 10 seconds 6 Compression test over the carpal tunnel on the LEFT is normal  Imaging: No specific imaging but nerve conduction study and report are reviewed indicating 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  I think a reasonable plan would be continued bracing with vitamin B6 and Neurontin if no improvement and carpal tunnel release can be done.  The patient understands that the diabetic polyneuropathy will undermine her overall results in terms of complete resolution.

## 2011-08-01 ENCOUNTER — Other Ambulatory Visit: Payer: Self-pay | Admitting: Family Medicine

## 2011-08-01 DIAGNOSIS — Z139 Encounter for screening, unspecified: Secondary | ICD-10-CM

## 2011-09-16 ENCOUNTER — Ambulatory Visit (HOSPITAL_COMMUNITY)
Admission: RE | Admit: 2011-09-16 | Discharge: 2011-09-16 | Disposition: A | Discharge status: Home / Self Care | Payer: Medicare Other | Source: Ambulatory Visit | Attending: Family Medicine | Admitting: Family Medicine

## 2011-09-16 DIAGNOSIS — Z1231 Encounter for screening mammogram for malignant neoplasm of breast: Secondary | ICD-10-CM | POA: Insufficient documentation

## 2011-09-16 DIAGNOSIS — Z139 Encounter for screening, unspecified: Secondary | ICD-10-CM

## 2011-10-01 ENCOUNTER — Encounter: Payer: Self-pay | Admitting: Orthopedic Surgery

## 2011-10-01 ENCOUNTER — Ambulatory Visit (INDEPENDENT_AMBULATORY_CARE_PROVIDER_SITE_OTHER): Payer: Medicare Other | Admitting: Orthopedic Surgery

## 2011-10-01 VITALS — BP 140/70 | Ht 61.5 in | Wt 195.0 lb

## 2011-10-01 DIAGNOSIS — G5600 Carpal tunnel syndrome, unspecified upper limb: Secondary | ICD-10-CM

## 2011-10-01 MED ORDER — GABAPENTIN 100 MG PO CAPS: 100.0000 mg | ORAL_CAPSULE | Freq: Three times a day (TID) | ORAL | Status: DC

## 2011-10-01 NOTE — Progress Notes (Signed)
Patient ID: [name], female   DOB: 03-30-1946, 66 y.o.   MRN: [id number] Chief Complaint  Patient presents with  . Follow-up    2 month recheck left carpal tunnel     The patient has improved on gabapentin she would like to continue.  She has excellent flexion of her hands and wrists with good grip strength  Numbness and tingling decreased compared to last visit  Carpal tunnel syndrome  Improved with medication continue medication

## 2011-10-01 NOTE — Patient Instructions (Signed)
Continue gabapentin.

## 2011-10-24 LAB — HEMOGLOBIN A1C
Hgb A1c MFr Bld: 5.9 % — ABNORMAL HIGH (ref <5.7)
Mean Plasma Glucose: 123 mg/dL — ABNORMAL HIGH (ref <117)

## 2011-10-29 ENCOUNTER — Ambulatory Visit: Payer: Medicare Other | Admitting: Family Medicine

## 2011-10-31 ENCOUNTER — Encounter: Payer: Self-pay | Admitting: Family Medicine

## 2011-10-31 ENCOUNTER — Ambulatory Visit (INDEPENDENT_AMBULATORY_CARE_PROVIDER_SITE_OTHER): Payer: Medicare Other | Admitting: Family Medicine

## 2011-10-31 ENCOUNTER — Other Ambulatory Visit: Payer: Self-pay | Admitting: Family Medicine

## 2011-10-31 VITALS — BP 162/92 | HR 84 | Resp 16 | Ht 61.5 in | Wt 201.0 lb

## 2011-10-31 DIAGNOSIS — E785 Hyperlipidemia, unspecified: Secondary | ICD-10-CM

## 2011-10-31 DIAGNOSIS — F329 Major depressive disorder, single episode, unspecified: Secondary | ICD-10-CM

## 2011-10-31 DIAGNOSIS — E119 Type 2 diabetes mellitus without complications: Secondary | ICD-10-CM

## 2011-10-31 DIAGNOSIS — N760 Acute vaginitis: Secondary | ICD-10-CM

## 2011-10-31 DIAGNOSIS — I10 Essential (primary) hypertension: Secondary | ICD-10-CM

## 2011-10-31 DIAGNOSIS — E669 Obesity, unspecified: Secondary | ICD-10-CM

## 2011-10-31 DIAGNOSIS — F3289 Other specified depressive episodes: Secondary | ICD-10-CM

## 2011-10-31 MED ORDER — TRAZODONE HCL 100 MG PO TABS: ORAL_TABLET | ORAL | Status: DC

## 2011-10-31 MED ORDER — LOSARTAN POTASSIUM-HCTZ 100-12.5 MG PO TABS: ORAL_TABLET | ORAL | Status: DC

## 2011-10-31 NOTE — Patient Instructions (Addendum)
cPE in 4.5 month, please call if you need me before.  Blood sugars are excellent  Your blood pressure is high, please take medication daily, as prescribed.   Specimens will be sent for testing your d/c we will call with result.  Weight loss will help with back pain  HBA1C in 4.5 month, cbc, fasting lipid and cmp and TSH

## 2011-10-31 NOTE — Progress Notes (Signed)
  Subjective:    Patient ID: [name], female    DOB: 1946-04-29, 66 y.o.   MRN: [id number]  HPI The PT is here for follow up and re-evaluation of chronic medical conditions, medication management and review of any available recent lab and radiology data.  Preventive health is updated, specifically  Cancer screening and Immunization.   Questions or concerns regarding consultations or procedures which the PT has had in the interim are  addressed. The PT denies any adverse reactions to current medications since the last visit.  There are no new concerns.  There are no specific complaints        Review of Systems See HPI Denies recent fever or chills. Denies sinus pressure, nasal congestion, ear pain or sore throat. Denies chest congestion, productive cough or wheezing. Denies chest pains, palpitations and leg swelling Denies abdominal pain, nausea, vomiting,diarrhea or constipation.   Denies dysuria, frequency, hesitancy or incontinence. Increased back pain in the past month, no recent trauma, does have some  limitation in mobility. Denies headaches, seizures, numbness, or tingling. Denies uncontrolled  depression, anxiety or insomnia. Denies skin break down or rash.c/o malodorous vaginal discharge x 3 days, wants this checked. Does not douche, denies associated vaginal itch        Objective:   Physical Exam Patient alert and oriented and in no cardiopulmonary distress.  HEENT: No facial asymmetry, EOMI, no sinus tenderness,  oropharynx pink and moist.  Neck supple no adenopathy.  Chest: Clear to auscultation bilaterally.  CVS: S1, S2 no murmurs, no S3.  ABD: Soft non tender. Bowel sounds normal.  Ext: No edema  MS: decreased  ROM spine,adequate in  shoulders, hips and knees.  Skin: Intact, no ulcerations or rash noted.  Psych: Good eye contact, normal affect. Memory intact not anxious or depressed appearing.  CNS: CN 2-12 intact, power, tone and sensation  normal throughout.        Assessment & Plan:

## 2011-11-01 LAB — WET PREP BY MOLECULAR PROBE
Gardnerella vaginalis: NEGATIVE
Trichomonas vaginosis: NEGATIVE

## 2011-11-01 LAB — GC/CHLAMYDIA PROBE AMP, GENITAL
Chlamydia, DNA Probe: NEGATIVE
GC Probe Amp, Genital: NEGATIVE

## 2011-11-03 DIAGNOSIS — N760 Acute vaginitis: Secondary | ICD-10-CM | POA: Insufficient documentation

## 2011-11-03 NOTE — Assessment & Plan Note (Signed)
Uncontrolled at this visit, pt has not taken her medication

## 2011-11-03 NOTE — Assessment & Plan Note (Signed)
Controlled, no change in medication  

## 2011-11-03 NOTE — Assessment & Plan Note (Signed)
Hyperlipidemia:Low fat diet discussed and encouraged. \updated lab data needed 

## 2011-11-03 NOTE — Assessment & Plan Note (Signed)
Controlled, no change in medication  Pt no longer in therapy

## 2011-11-03 NOTE — Assessment & Plan Note (Signed)
Specimens sent for culture , will treat based on result

## 2011-11-03 NOTE — Assessment & Plan Note (Signed)
unchanged Patient re-educated about  the importance of commitment to a  minimum of 150 minutes of exercise per week. The importance of healthy food choices with portion control discussed. Encouraged to start a food diary, count calories and to consider  joining a support group. Sample diet sheets offered. Goals set by the patient for the next several months.    

## 2011-12-04 ENCOUNTER — Other Ambulatory Visit: Payer: Self-pay | Admitting: *Deleted

## 2011-12-04 DIAGNOSIS — G5600 Carpal tunnel syndrome, unspecified upper limb: Secondary | ICD-10-CM

## 2011-12-04 MED ORDER — GABAPENTIN 100 MG PO CAPS: 100.0000 mg | ORAL_CAPSULE | Freq: Three times a day (TID) | ORAL | Status: DC

## 2011-12-09 ENCOUNTER — Other Ambulatory Visit: Payer: Self-pay

## 2011-12-09 DIAGNOSIS — I10 Essential (primary) hypertension: Secondary | ICD-10-CM

## 2011-12-09 MED ORDER — NIACIN ER (ANTIHYPERLIPIDEMIC) 500 MG PO TBCR: EXTENDED_RELEASE_TABLET | ORAL | Status: DC

## 2011-12-09 MED ORDER — GLUCOSE BLOOD VI STRP: ORAL_STRIP | Status: DC

## 2011-12-09 MED ORDER — METOPROLOL SUCCINATE ER 50 MG PO TB24: ORAL_TABLET | ORAL | Status: DC

## 2011-12-09 MED ORDER — CYCLOBENZAPRINE HCL 5 MG PO TABS: ORAL_TABLET | ORAL | Status: DC

## 2011-12-09 MED ORDER — TRAZODONE HCL 100 MG PO TABS: ORAL_TABLET | ORAL | Status: DC

## 2011-12-09 MED ORDER — POTASSIUM CHLORIDE CRYS ER 20 MEQ PO TBCR: EXTENDED_RELEASE_TABLET | ORAL | Status: DC

## 2011-12-09 MED ORDER — LOSARTAN POTASSIUM-HCTZ 100-12.5 MG PO TABS: ORAL_TABLET | ORAL | Status: DC

## 2011-12-09 MED ORDER — AMLODIPINE BESYLATE 2.5 MG PO TABS: 2.5000 mg | ORAL_TABLET | Freq: Every day | ORAL | Status: DC

## 2011-12-09 MED ORDER — SERTRALINE HCL 100 MG PO TABS: ORAL_TABLET | ORAL | Status: DC

## 2012-02-19 ENCOUNTER — Ambulatory Visit (HOSPITAL_COMMUNITY)
Admission: RE | Admit: 2012-02-19 | Discharge: 2012-02-19 | Disposition: A | Discharge status: Home / Self Care | Payer: Medicare Other | Source: Ambulatory Visit | Attending: Family Medicine | Admitting: Family Medicine

## 2012-02-19 ENCOUNTER — Encounter: Payer: Self-pay | Admitting: Family Medicine

## 2012-02-19 ENCOUNTER — Ambulatory Visit (INDEPENDENT_AMBULATORY_CARE_PROVIDER_SITE_OTHER): Payer: Medicare Other | Admitting: Family Medicine

## 2012-02-19 VITALS — BP 150/82 | HR 88 | Resp 16 | Ht 61.5 in | Wt 208.0 lb

## 2012-02-19 DIAGNOSIS — I10 Essential (primary) hypertension: Secondary | ICD-10-CM

## 2012-02-19 DIAGNOSIS — E785 Hyperlipidemia, unspecified: Secondary | ICD-10-CM

## 2012-02-19 DIAGNOSIS — M25559 Pain in unspecified hip: Secondary | ICD-10-CM | POA: Insufficient documentation

## 2012-02-19 DIAGNOSIS — E119 Type 2 diabetes mellitus without complications: Secondary | ICD-10-CM

## 2012-02-19 DIAGNOSIS — F329 Major depressive disorder, single episode, unspecified: Secondary | ICD-10-CM

## 2012-02-19 DIAGNOSIS — M25552 Pain in left hip: Secondary | ICD-10-CM

## 2012-02-19 MED ORDER — PREDNISONE (PAK) 5 MG PO TABS: 5.0000 mg | ORAL_TABLET | ORAL | Status: DC

## 2012-02-19 NOTE — Progress Notes (Signed)
  Subjective:    Patient ID: [name], female    DOB: 20-Jan-1946, 66 y.o.   MRN: [id number]  HPI The PT is here for follow up and re-evaluation of chronic medical conditions, medication management and review of any available recent lab and radiology data.  Preventive health is updated, specifically  Cancer screening and Immunization.   Questions or concerns regarding consultations or procedures which the PT has had in the interim are  addressed. The PT denies any adverse reactions to current medications since the last visit.  3 week h/o left hip pain difficulty with ambulation no associated trauma    Review of Systems See HPI Denies recent fever or chills. Denies sinus pressure, nasal congestion, ear pain or sore throat. Denies chest congestion, productive cough or wheezing. Denies chest pains, palpitations and leg swelling Denies abdominal pain, nausea, vomiting,diarrhea or constipation.   Denies dysuria, frequency, hesitancy or incontinence.  Denies headaches, seizures, numbness, or tingling. Denies depression, anxiety or insomnia. Denies skin break down or rash.        Objective:   Physical Exam Patient alert and oriented and in no cardiopulmonary distress.  HEENT: No facial asymmetry, EOMI, no sinus tenderness,  oropharynx pink and moist.  Neck supple no adenopathy.  Chest: Clear to auscultation bilaterally.  CVS: S1, S2 no murmurs, no S3.  ABD: Soft non tender. Bowel sounds normal.  Ext: No edema  MS: Adequate ROM spine, shoulders,  and knees.Decreased ROM left hip  Skin: Intact, no ulcerations or rash noted.  Psych: Good eye contact, normal affect. Memory intact not anxious or depressed appearing.  CNS: CN 2-12 intact, power, tone and sensation normal throughout.        Assessment & Plan:

## 2012-02-19 NOTE — Patient Instructions (Addendum)
Pelvic and breast exam in August.  Toradol and depo medrol in office today for left hip pain, and medication also sent to your pharmacy. An xray is also ordered.  Medication prescribed will also help the itching.

## 2012-02-24 DIAGNOSIS — M25559 Pain in unspecified hip: Secondary | ICD-10-CM

## 2012-02-24 MED ORDER — METHYLPREDNISOLONE ACETATE 80 MG/ML IJ SUSP
80.0000 mg | Freq: Once | INTRAMUSCULAR | Status: AC
  Administered 2012-02-24: 80 mg via INTRAMUSCULAR

## 2012-02-24 MED ORDER — KETOROLAC TROMETHAMINE 60 MG/2ML IM SOLN
60.0000 mg | Freq: Once | INTRAMUSCULAR | Status: AC
  Administered 2012-02-24: 60 mg via INTRAMUSCULAR

## 2012-02-24 NOTE — Assessment & Plan Note (Signed)
Controlled, no change in medication  

## 2012-02-24 NOTE — Assessment & Plan Note (Signed)
Updated labs needed Hyperlipidemia:Low fat diet discussed and encouraged.   

## 2012-02-24 NOTE — Assessment & Plan Note (Signed)
Acute onset, anti inflammatories and oral steroids

## 2012-02-24 NOTE — Addendum Note (Signed)
Addended by: Abner Greenspan on: 02/24/2012 04:48 PM   Modules accepted: Orders

## 2012-02-24 NOTE — Assessment & Plan Note (Signed)
Diet controlled.  

## 2012-03-02 ENCOUNTER — Other Ambulatory Visit: Payer: Self-pay | Admitting: Family Medicine

## 2012-03-03 LAB — COMPREHENSIVE METABOLIC PANEL
AST: 18 U/L (ref 0–37)
Albumin: 3.7 g/dL (ref 3.5–5.2)
Alkaline Phosphatase: 65 U/L (ref 39–117)
Potassium: 3.8 mEq/L (ref 3.5–5.3)
Sodium: 143 mEq/L (ref 135–145)
Total Bilirubin: 0.5 mg/dL (ref 0.3–1.2)
Total Protein: 6.6 g/dL (ref 6.0–8.3)

## 2012-03-03 LAB — LIPID PANEL
Cholesterol: 207 mg/dL — ABNORMAL HIGH (ref 0–200)
HDL: 76 mg/dL (ref >39)
Total CHOL/HDL Ratio: 2.7 Ratio
Triglycerides: 46 mg/dL (ref <150)

## 2012-03-03 LAB — CBC
MCH: 29.6 pg (ref 26.0–34.0)
MCHC: 33.5 g/dL (ref 30.0–36.0)
Platelets: 195 10*3/uL (ref 150–400)
RDW: 14.4 % (ref 11.5–15.5)

## 2012-03-05 ENCOUNTER — Ambulatory Visit (INDEPENDENT_AMBULATORY_CARE_PROVIDER_SITE_OTHER): Payer: Medicare Other | Admitting: Family Medicine

## 2012-03-05 ENCOUNTER — Other Ambulatory Visit (HOSPITAL_COMMUNITY)
Admission: RE | Admit: 2012-03-05 | Discharge: 2012-03-05 | Disposition: A | Discharge status: Home / Self Care | Payer: Medicare Other | Source: Ambulatory Visit | Attending: Family Medicine | Admitting: Family Medicine

## 2012-03-05 ENCOUNTER — Encounter: Payer: Self-pay | Admitting: Family Medicine

## 2012-03-05 ENCOUNTER — Other Ambulatory Visit: Payer: Self-pay

## 2012-03-05 VITALS — BP 154/90 | HR 82 | Resp 16 | Ht 61.5 in | Wt 202.8 lb

## 2012-03-05 DIAGNOSIS — I10 Essential (primary) hypertension: Secondary | ICD-10-CM

## 2012-03-05 DIAGNOSIS — Z01419 Encounter for gynecological examination (general) (routine) without abnormal findings: Secondary | ICD-10-CM | POA: Insufficient documentation

## 2012-03-05 DIAGNOSIS — Z1211 Encounter for screening for malignant neoplasm of colon: Secondary | ICD-10-CM

## 2012-03-05 DIAGNOSIS — Z0000 Encounter for general adult medical examination without abnormal findings: Secondary | ICD-10-CM

## 2012-03-05 LAB — POC HEMOCCULT BLD/STL (OFFICE/1-CARD/DIAGNOSTIC): Fecal Occult Blood, POC: NEGATIVE

## 2012-03-05 MED ORDER — SILDENAFIL CITRATE 20 MG PO TABS: ORAL_TABLET | ORAL | Status: DC

## 2012-03-05 MED ORDER — FENOFIBRATE 145 MG PO TABS: ORAL_TABLET | ORAL | Status: DC

## 2012-03-05 MED ORDER — AMLODIPINE BESYLATE 5 MG PO TABS: 5.0000 mg | ORAL_TABLET | Freq: Every day | ORAL | Status: DC

## 2012-03-05 NOTE — Assessment & Plan Note (Signed)
Uncontrolled increase amlodipine dose

## 2012-03-05 NOTE — Assessment & Plan Note (Signed)
Pt in for pelvic and breast exam, details are documented in her chart

## 2012-03-05 NOTE — Addendum Note (Signed)
Addended by: Kandis Fantasia B on: 03/05/2012 01:02 PM   Modules accepted: Orders

## 2012-03-05 NOTE — Progress Notes (Signed)
  Subjective:    Patient ID: [name], female    DOB: 04-02-1946, 66 y.o.   MRN: [id number]  HPI Pt in for pelvic and breast exam. Her medications are reviewed, and blood pressure remains elevated, hence dose adjustment made. She has no complaints or concerns   Review of Systems See HPI Denies recent fever or chills. Denies sinus pressure, nasal congestion, ear pain or sore throat. Denies chest congestion, productive cough or wheezing. Denies chest pains, palpitations and leg swelling Denies abdominal pain, nausea, vomiting,diarrhea or constipation.   Denies uncontrolled  depression, anxiety or insomnia. Denies skin break down or rash.        Objective:   Physical Exam Pleasant well nourished female, alert and oriented x 3, in no cardio-pulmonary distress. Afebrile. HEENT No facial trauma or asymetry. Sinuses non tender.  EOMI,  . Oropharynx moist, no exudate, good dentition. Neck: supple, no adenopathy,JVD or thyromegaly.No bruits.  Chest: Clear to ascultation bilaterally.No crackles or wheezes. Non tender to palpation  Breast: No asymetry,no masses. No nipple discharge or inversion.No dimpling of skin on breast and no rash. No axillary or supraclavicular adenopathy  Cardiovascular system; Heart sounds normal,  S1 and  S2 ,no S3.   Peripheral pulses normal.  Abdomen: Soft, non tender, no organomegaly or masses.  Rectal:  No mass. Guaiac negative stool.  GU: External genitalia normal. No lesions.Normal labia majora and minora, female distribution of hair. No nodules or ulceration Vaginal canal normal.physiologic discharge. Uterus absent, no adnexal masses, no  adnexal tenderness.  Musculoskeletal exam: Decreased  ROM of spine,adequate in  hips , shoulders and knees. No deformity ,swelling or crepitus noted. No muscle wasting or atrophy.   Neurologic: Cranial nerves 2 to 12 intact. Power,  normal throughout. No disturbance in gait. No  tremor.  Skin: Intact, hyperpigmented dry skin under abdominal fold   Psych; Normal mood and affect. Judgement and concentration normal       Assessment & Plan:

## 2012-03-05 NOTE — Patient Instructions (Addendum)
F/u in 2 month  Blood pressure is still high, increase amlodipine to 5 mg daily, OK to take TWO 2.5 mg tablets daily till done.  You ned to fill and take tricor for your cholesterol, you do not have this with you today, please make sure that you have it

## 2012-03-18 ENCOUNTER — Other Ambulatory Visit: Payer: Self-pay | Admitting: Family Medicine

## 2012-03-18 ENCOUNTER — Other Ambulatory Visit: Payer: Self-pay

## 2012-03-18 MED ORDER — FLUCONAZOLE 150 MG PO TABS: ORAL_TABLET | ORAL | Status: DC

## 2012-05-05 ENCOUNTER — Ambulatory Visit: Payer: Medicare Other | Admitting: Family Medicine

## 2012-05-08 ENCOUNTER — Ambulatory Visit (INDEPENDENT_AMBULATORY_CARE_PROVIDER_SITE_OTHER): Payer: Medicare Other | Admitting: Family Medicine

## 2012-05-08 ENCOUNTER — Encounter: Payer: Self-pay | Admitting: Family Medicine

## 2012-05-08 VITALS — BP 186/92 | HR 78 | Resp 18 | Ht 61.5 in | Wt 202.0 lb

## 2012-05-08 DIAGNOSIS — I272 Pulmonary hypertension, unspecified: Secondary | ICD-10-CM

## 2012-05-08 DIAGNOSIS — E785 Hyperlipidemia, unspecified: Secondary | ICD-10-CM

## 2012-05-08 DIAGNOSIS — F329 Major depressive disorder, single episode, unspecified: Secondary | ICD-10-CM

## 2012-05-08 DIAGNOSIS — I2789 Other specified pulmonary heart diseases: Secondary | ICD-10-CM

## 2012-05-08 DIAGNOSIS — R7309 Other abnormal glucose: Secondary | ICD-10-CM

## 2012-05-08 DIAGNOSIS — I10 Essential (primary) hypertension: Secondary | ICD-10-CM

## 2012-05-08 DIAGNOSIS — E669 Obesity, unspecified: Secondary | ICD-10-CM

## 2012-05-08 DIAGNOSIS — R7303 Prediabetes: Secondary | ICD-10-CM

## 2012-05-08 MED ORDER — AMLODIPINE BESYLATE 10 MG PO TABS: 10.0000 mg | ORAL_TABLET | Freq: Every day | ORAL | Status: DC

## 2012-05-08 NOTE — Assessment & Plan Note (Signed)
Uncontrolled, low fat diet discussed and encouraged. No med change at this time

## 2012-05-08 NOTE — Assessment & Plan Note (Signed)
Deteriorated. Patient re-educated about  the importance of commitment to a  minimum of 150 minutes of exercise per week. The importance of healthy food choices with portion control discussed. Encouraged to start a food diary, count calories and to consider  joining a support group. Sample diet sheets offered. Goals set by the patient for the next several months.    

## 2012-05-08 NOTE — Assessment & Plan Note (Signed)
Controlled, no change in medication No longer needs therapy x 24 month, spouse is alcoholic

## 2012-05-08 NOTE — Assessment & Plan Note (Signed)
Managed through pulmonary with daily cialis

## 2012-05-08 NOTE — Patient Instructions (Addendum)
Annual wellness in December   Fasting lipid, cmp and EGFR, hBA1C  In December  Blood pressure too high, Increase amlodipine to 10mg  daily, OK to take four 2.5 mg tablets once daily  It is important that you exercise regularly at least 30 minutes 5 times a week. If you develop chest pain, have severe difficulty breathing, or feel very tired, stop exercising immediately and seek medical attention   A healthy diet is rich in fruit, vegetables and whole grains. Poultry fish, nuts and beans are a healthy choice for protein rather then red meat. A low sodium diet and drinking 64 ounces of water daily is generally recommended. Oils and sweet should be limited. Carbohydrates especially for those who are diabetic or overweight, should be limited to 30-45 gram per meal. It is important to eat on a regular schedule, at least 3 times daily. Snacks should be primarily fruits, vegetables or nuts.

## 2012-05-08 NOTE — Assessment & Plan Note (Signed)
Patient educated about the importance of limiting  Carbohydrate intake , the need to commit to daily physical activity for a minimum of 30 minutes , and to commit weight loss. The fact that changes in all these areas will reduce or eliminate all together the development of diabetes is stressed.   Updated HBa1C for next visit Referred for eye exam

## 2012-05-08 NOTE — Assessment & Plan Note (Signed)
Uncontrolled, dose increase in amlodipine to 10mg daily DASH diet and commitment to daily physical activity for a minimum of 30 minutes discussed and encouraged, as a part of hypertension management. The importance of attaining a healthy weight is also discussed.  

## 2012-05-08 NOTE — Progress Notes (Signed)
  Subjective:    Patient ID: [name], female    DOB: 29-Feb-1946, 66 y.o.   MRN: [id number]  HPI The PT is here for follow up and re-evaluation of chronic medical conditions,most specifically uncontrolled hypertension, medication management and review of any available recent lab and radiology data.  Preventive health is updated, specifically  Cancer screening and Immunization.   Questions or concerns regarding consultations or procedures which the PT has had in the interim are  addressed. The PT denies any adverse reactions to current medications since the last visit.  There are no new concerns.  There are no specific complaints       Review of Systems See HPI Denies recent fever or chills. Denies sinus pressure, nasal congestion, ear pain or sore throat. Denies chest congestion, productive cough or wheezing. Denies chest pains, palpitations and leg swelling Denies abdominal pain, nausea, vomiting,diarrhea or constipation.   Denies dysuria, frequency, hesitancy or incontinence. C/o chronic  joint pain, swelling and limitation in mobility. Denies headaches, seizures, numbness, or tingling. Denies uncontrolled  depression, anxiety or insomnia. Denies skin break down or rash.        Objective:   Physical Exam Patient alert and oriented and in no cardiopulmonary distress.  HEENT: No facial asymmetry, EOMI, no sinus tenderness,  oropharynx pink and moist.  Neck supple no adenopathy.  Chest: Clear to auscultation bilaterally.  CVS: S1, S2 no murmurs, no S3.  ABD: Soft non tender. Bowel sounds normal.  Ext: No edema  MS: Adequate though reduced ROM spine, shoulders, hips and knees.  Skin: Intact, no ulcerations or rash noted.  Psych: Good eye contact, normal affect. Memory intact not anxious or depressed appearing.  CNS: CN 2-12 intact, power, tone and sensation normal throughout.        Assessment & Plan:

## 2012-06-24 ENCOUNTER — Other Ambulatory Visit: Payer: Self-pay | Admitting: Family Medicine

## 2012-07-13 LAB — HEMOGLOBIN A1C: Mean Plasma Glucose: 111 mg/dL (ref <117)

## 2012-07-13 LAB — LIPID PANEL
HDL: 74 mg/dL (ref >39)
LDL Cholesterol: 112 mg/dL — ABNORMAL HIGH (ref 0–99)
Total CHOL/HDL Ratio: 2.7 Ratio
Triglycerides: 66 mg/dL (ref <150)
VLDL: 13 mg/dL (ref 0–40)

## 2012-07-13 LAB — COMPLETE METABOLIC PANEL WITH GFR
ALT: 10 U/L (ref 0–35)
AST: 15 U/L (ref 0–37)
Alkaline Phosphatase: 69 U/L (ref 39–117)
Creat: 0.79 mg/dL (ref 0.50–1.10)
Total Bilirubin: 0.6 mg/dL (ref 0.3–1.2)

## 2012-07-16 ENCOUNTER — Ambulatory Visit (INDEPENDENT_AMBULATORY_CARE_PROVIDER_SITE_OTHER): Payer: Medicare Other | Admitting: Family Medicine

## 2012-07-16 ENCOUNTER — Encounter: Payer: Self-pay | Admitting: Family Medicine

## 2012-07-16 VITALS — BP 140/82 | HR 73 | Resp 16 | Ht 61.5 in | Wt 198.0 lb

## 2012-07-16 DIAGNOSIS — Z0000 Encounter for general adult medical examination without abnormal findings: Secondary | ICD-10-CM

## 2012-07-16 DIAGNOSIS — R7309 Other abnormal glucose: Secondary | ICD-10-CM

## 2012-07-16 DIAGNOSIS — R7303 Prediabetes: Secondary | ICD-10-CM

## 2012-07-16 DIAGNOSIS — E785 Hyperlipidemia, unspecified: Secondary | ICD-10-CM

## 2012-07-16 NOTE — Progress Notes (Signed)
Subjective:    Patient ID: [name], female    DOB: Jul 10, 1946, 66 y.o.   MRN: [id number]  HPI Preventive Screening-Counseling & Management   Patient present here today for a Medicare annual wellness visit.   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 A little  Over the past two weeks, have you felt little interest or pleasure in doing things? No  Have you lost interest or pleasure in daily life? No  Do you often feel hopeless? No  Do you cry easily over simple problems? No   Activities of Daily Living  In your present state of health, do you have any difficulty performing the following activities?  Driving?: No Managing money?: No Feeding yourself?:No Getting from bed to chair?:No, however knee gets stiff Climbing a flight of stairs?:holds on climbing stairs Preparing food and eating?:No Bathing or showering?:No Getting dressed?:No Getting to the toilet?:No Using the toilet?:No Moving around from place to place?: No  Fall Risk Assessment In the past year have you fallen or had a near fall?:No Are you currently taking any medications that make you dizziness?:No   Hearing Difficulties: No Do you often ask people to speak up or repeat  themselves?:No Do you experience ringing or noises in your ears?:No Do you have difficulty understanding soft or whispered voices?:No  Cognitive Testing  Alert? Yes Normal Appearance?Yes  Oriented to person? Yes Place? Yes  Time? Yes  Displays appropriate judgment?Yes  Can read the correct time from a watch face? yes Are you having problems remembering things?No  Advanced Directives have been discussed with the patient?Yes , full code, does not want prolonged support if outcome is poor and /or brain dead   List the Names of Other Physician/Practitioners you currently use: Dr Romeo Apple, Dr Juanetta Gosling, Dr Ulice Brilliant   I  Assessment:    Annual Wellness Exam   Plan:    During the course of the visit the patient was educated and counseled about appropriate screening and preventive services including:  A healthy diet is rich in fruit, vegetables and whole grains. Poultry fish, nuts and beans are a healthy choice for protein rather then red meat. A low sodium diet and drinking 64 ounces of water daily is generally recommended. Oils and sweet should be limited. Carbohydrates especially for those who are diabetic or overweight, should be limited to 30-45 gram per meal. It is important to eat on a regular schedule, at least 3 times daily. Snacks should be primarily fruits, vegetables or nuts. It is important that you exercise regularly at least 30 minutes 5 times a week. If you develop chest pain, have severe difficulty breathing, or feel very tired, stop exercising immediately and seek medical attention  Immunization reviewed and updated. Cancer screening  reviewed and updated    Patient Instructions (the written plan) was given to the patient.  Medicare Attestation  I have personally reviewed:  The patient's medical and social history  Their use of alcohol, tobacco or illicit drugs  Their current medications and supplements  The patient's functional ability including ADLs,fall risks, home  safety risks, cognitive, and hearing and visual impairment  Diet and physical activities  Evidence for depression or mood disorders  The patient's weight, height, BMI, and visual acuity have been recorded in the chart. I have made referrals, counseling, and provided education to the patient based on review of the above and I have provided the patient with a written personalized care plan for preventive services.      Review of Systems     Objective:   Physical Exam        Assessment & Plan:

## 2012-07-16 NOTE — Patient Instructions (Addendum)
F/u in 4.5 month, please call if you need me before  Please reduce calcium with D to one tablet twice daily.  Stop taking the medication we discussed, you do not need it, follow the med list you have.  Fasting lipid, cmp and HBa1C in 4.5 month  Increase exercise to 30 minutes at least 5 days per week  Congrats on weight loss , keep it up

## 2012-07-22 NOTE — Assessment & Plan Note (Signed)
Annual wellness completed as documented.Pt is fully functional and able to adequately car for herself. She has no uncontrolled depression, has improved a lot since joining seniors group and is very involved in crochet. Medications reviewed and some have been discontinued. She is up to date on all immunzation and screening, and is very involved in her health and well being End of life issues were addressed and have been documented. She is encouraged to discuss with her  children and formalize this

## 2012-08-05 ENCOUNTER — Other Ambulatory Visit: Payer: Self-pay | Admitting: Family Medicine

## 2012-09-08 ENCOUNTER — Other Ambulatory Visit: Payer: Self-pay | Admitting: Family Medicine

## 2012-09-08 DIAGNOSIS — Z139 Encounter for screening, unspecified: Secondary | ICD-10-CM

## 2012-09-17 ENCOUNTER — Ambulatory Visit (HOSPITAL_COMMUNITY)
Admission: RE | Admit: 2012-09-17 | Discharge: 2012-09-17 | Disposition: A | Discharge status: Home / Self Care | Payer: Medicare Other | Source: Ambulatory Visit | Attending: Family Medicine | Admitting: Family Medicine

## 2012-09-17 DIAGNOSIS — Z1231 Encounter for screening mammogram for malignant neoplasm of breast: Secondary | ICD-10-CM | POA: Insufficient documentation

## 2012-12-10 ENCOUNTER — Ambulatory Visit: Payer: Medicare Other | Admitting: Family Medicine

## 2012-12-11 ENCOUNTER — Encounter: Payer: Self-pay | Admitting: Family Medicine

## 2012-12-18 ENCOUNTER — Telehealth: Payer: Self-pay | Admitting: Family Medicine

## 2012-12-22 ENCOUNTER — Encounter: Payer: Self-pay | Admitting: Family Medicine

## 2012-12-22 ENCOUNTER — Ambulatory Visit (INDEPENDENT_AMBULATORY_CARE_PROVIDER_SITE_OTHER): Payer: Medicare Other | Admitting: Family Medicine

## 2012-12-22 VITALS — BP 132/76 | HR 78 | Resp 18 | Ht 61.5 in | Wt 199.1 lb

## 2012-12-22 DIAGNOSIS — I10 Essential (primary) hypertension: Secondary | ICD-10-CM

## 2012-12-22 DIAGNOSIS — R7309 Other abnormal glucose: Secondary | ICD-10-CM

## 2012-12-22 DIAGNOSIS — K219 Gastro-esophageal reflux disease without esophagitis: Secondary | ICD-10-CM

## 2012-12-22 DIAGNOSIS — E669 Obesity, unspecified: Secondary | ICD-10-CM

## 2012-12-22 DIAGNOSIS — F329 Major depressive disorder, single episode, unspecified: Secondary | ICD-10-CM

## 2012-12-22 DIAGNOSIS — E785 Hyperlipidemia, unspecified: Secondary | ICD-10-CM

## 2012-12-22 DIAGNOSIS — B369 Superficial mycosis, unspecified: Secondary | ICD-10-CM | POA: Insufficient documentation

## 2012-12-22 DIAGNOSIS — R7302 Impaired glucose tolerance (oral): Secondary | ICD-10-CM

## 2012-12-22 DIAGNOSIS — I2789 Other specified pulmonary heart diseases: Secondary | ICD-10-CM

## 2012-12-22 MED ORDER — NYSTATIN 100000 UNIT/GM EX POWD: Freq: Two times a day (BID) | CUTANEOUS | Status: AC

## 2012-12-22 MED ORDER — CLOTRIMAZOLE-BETAMETHASONE 1-0.05 % EX CREA: TOPICAL_CREAM | Freq: Two times a day (BID) | CUTANEOUS | Status: DC

## 2012-12-22 NOTE — Patient Instructions (Addendum)
F/u in 4 month  Fasting lipid, cmp and EGFR, hBA1C  Powder and cream prescribed  For rash between breasts  It is important that you exercise regularly at least 30 minutes 5 times a week. If you develop chest pain, have severe difficulty breathing, or feel very tired, stop exercising immediately and seek medical attention    A healthy diet is rich in fruit, vegetables and whole grains. Poultry fish, nuts and beans are a healthy choice for protein rather then red meat. A low sodium diet and drinking 64 ounces of water daily is generally recommended. Oils and sweet should be limited. Carbohydrates especially for those who are diabetic or overweight, should be limited to 30-45 gram per meal. It is important to eat on a regular schedule, at least 3 times daily. Snacks should be primarily fruits, vegetables or nuts.   You are referred to a new cardiologist per our discussion
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
## 2012-12-22 NOTE — Assessment & Plan Note (Signed)
neeeds updated echocardiogram and plans to change to local provider as her previous Doc is no longer in town, will refer to [facility] cardiology

## 2012-12-22 NOTE — Assessment & Plan Note (Signed)
Controlled, no change in medication  

## 2012-12-22 NOTE — Assessment & Plan Note (Signed)
Hyperlipidemia:Low fat diet discussed and encouraged.  Updated lab needed 

## 2012-12-22 NOTE — Progress Notes (Signed)
  Subjective:    Patient ID: [name], female    DOB: 08/21/1945, 67 y.o.   MRN: [id number]  HPI The PT is here for follow up and re-evaluation of chronic medical conditions, medication management and review of any available recent lab and radiology data.  Preventive health is updated, specifically  Cancer screening and Immunization. Needs to establish with a new cardiologist   The PT denies any adverse reactions to current medications since the last visit.  Rash between breast x 2 month, itches, no pus Continues to be involved with senior citizens group and to exercise faithfully, which is helping her mental health a lot. Her husband continues to drink heavily      Review of Systems See HPI Denies recent fever or chills. Denies sinus pressure, nasal congestion, ear pain or sore throat. Denies chest congestion, productive cough or wheezing. Denies chest pains, palpitations and leg swelling Denies abdominal pain, nausea, vomiting,diarrhea or constipation.   Denies dysuria, frequency, hesitancy or incontinence. Denies joint pain, swelling and limitation in mobility. Denies headaches, seizures, numbness, or tingling. Denies uncontrolled  depression, anxiety or insomnia.       Objective:   Physical Exam  Patient alert and oriented and in no cardiopulmonary distress.  HEENT: No facial asymmetry, EOMI, no sinus tenderness,  oropharynx pink and moist.  Neck supple no adenopathy.  Chest: Clear to auscultation bilaterally.  CVS: S1, S2 no murmurs, no S3.  ABD: Soft non tender. Bowel sounds normal.  Ext: No edema  MS: Adequate ROM spine, shoulders, hips and knees.  Skin: fungal and yeast infection between breasts, no ulceration  Psych: Good eye contact, normal affect. Memory intact not anxious or depressed appearing.  CNS: CN 2-12 intact, power, tone and sensation normal throughout.       Assessment & Plan:

## 2012-12-22 NOTE — Assessment & Plan Note (Signed)
Patient educated about the importance of limiting  Carbohydrate intake , the need to commit to daily physical activity for a minimum of 30 minutes , and to commit weight loss. The fact that changes in all these areas will reduce or eliminate all together the development of diabetes is stressed.   Updated lab needed 

## 2012-12-22 NOTE — Assessment & Plan Note (Signed)
Controlled, no change in medication DASH diet and commitment to daily physical activity for a minimum of 30 minutes discussed and encouraged, as a part of hypertension management. The importance of attaining a healthy weight is also discussed.  

## 2012-12-22 NOTE — Assessment & Plan Note (Signed)
Unchanged. Patient re-educated about  the importance of commitment to a  minimum of 150 minutes of exercise per week. The importance of healthy food choices with portion control discussed. Encouraged to start a food diary, count calories and to consider  joining a support group. Sample diet sheets offered. Goals set by the patient for the next several months.    

## 2012-12-22 NOTE — Assessment & Plan Note (Signed)
Acute infection between breasts, ointment and powder prescribed

## 2012-12-25 NOTE — Telephone Encounter (Signed)
Error/disregard

## 2013-01-25 ENCOUNTER — Telehealth: Payer: Self-pay | Admitting: Family Medicine

## 2013-01-25 NOTE — Telephone Encounter (Signed)
It is due in Feb of 2015. Tried to call back/ left message

## 2013-01-26 ENCOUNTER — Ambulatory Visit (INDEPENDENT_AMBULATORY_CARE_PROVIDER_SITE_OTHER): Payer: Medicare Other | Admitting: Cardiovascular Disease

## 2013-01-26 ENCOUNTER — Encounter: Payer: Self-pay | Admitting: Cardiovascular Disease

## 2013-01-26 VITALS — BP 133/76 | HR 78 | Ht 60.0 in | Wt 199.8 lb

## 2013-01-26 DIAGNOSIS — I272 Pulmonary hypertension, unspecified: Secondary | ICD-10-CM

## 2013-01-26 DIAGNOSIS — I10 Essential (primary) hypertension: Secondary | ICD-10-CM

## 2013-01-26 DIAGNOSIS — I2789 Other specified pulmonary heart diseases: Secondary | ICD-10-CM

## 2013-01-26 DIAGNOSIS — E7800 Pure hypercholesterolemia, unspecified: Secondary | ICD-10-CM

## 2013-01-26 NOTE — Progress Notes (Signed)
Patient ID: [name], female   DOB: [birth year], 67 y.o.   MRN: [id number]    CARDIOLOGY CONSULT NOTE  Patient ID: [name] MRN: [id number] DOB/AGE: 1945-11-02 67 y.o.  Admit date: (Not on file) Primary Physician: [name]. Mallory Hong, MD Reason for Consultation: Pulm HTN, HTN, Hyperlipidemia  HPI:  [name] is a 67 y.o. Woman with a PMH significant for obesity, mild pulmonary HTN, essential HTN, hypercholesterolemia, and depression. Her most recent echocardiogram from 02-13-2011 revealed normal LV systolic function, EF>55%, mild LVH, diastolic dysfunction, mild RV enlargement, mild MR/TR with a mildly elevated RVSP of 30-40 mmHg, suggestive of mild pulmonary arterial hypertension.  She forgot to take her meds for the past 3 days. She denies chest pain. She occasionally has shortness of breath, primarily when she's gardening or doing housework. She says, "Overall, I feel pretty good". She does use a inhaler for asthma. She uses CPAP for sleep apnea as well.  Review of systems complete and found to be negative unless listed above in HPI  Past Medical History: see HPI. Bilateral TKA. Sleep apnea. SocHx: husband is an alcoholic. Never smoked or drank alcohol.   Family History  Problem Relation Age of Onset  . Aneurysm Father   . Diabetes Father   . Diabetes Mother   . Hypertension Mother   . Heart disease Mother   . Diabetes Brother   . Hypertension Brother   . Hypertension Brother   . Hypertension Brother   . Diabetes Brother   . Diabetes Brother   . Hypertension Brother     History   Social History  . Marital Status: Married    Spouse Name: N/A    Number of Children: 2  . Years of Education: 12   Occupational History  . retired    .     Social History Main Topics  . Smoking status: Never Smoker   . Smokeless tobacco: Not on file  . Alcohol Use: No  . Drug Use: No  . Sexually Active: No   Other Topics Concern  . Not on file   Social History  Narrative  . No narrative on file      (Not in a hospital admission)  Physical exam Height 5' (1.524 m), weight 199 lb 12 oz (90.606 kg). General: NAD Neck: No JVD, no thyromegaly or thyroid nodule.  Lungs: Clear to auscultation bilaterally with normal respiratory effort. CV: Nondisplaced PMI.  Heart regular S1/S2, no S3/S4, no murmur.  No peripheral edema.  No carotid bruit.  Normal pedal pulses.  Abdomen: Soft, nontender, no hepatosplenomegaly, no distention.  Skin: Intact without lesions or rashes.  Neurologic: Alert and oriented x 3.  Psych: Normal affect. Extremities: No clubbing or cyanosis.  HEENT: Normal.   Labs:   Lab Results  Component Value Date   WBC 8.5 03/02/2012   HGB 13.0 03/02/2012   HCT 38.8 03/02/2012   MCV 88.4 03/02/2012   PLT 195 03/02/2012   No results found for this basename: NA, K, CL, CO2, BUN, CREATININE, CALCIUM, LABALBU, PROT, BILITOT, ALKPHOS, ALT, AST, GLUCOSE,  in the last 168 hours No results found for this basename: CKTOTAL, CKMB, CKMBINDEX, TROPONINI    Lab Results  Component Value Date   CHOL 199 05/08/2012   CHOL 207* 03/02/2012   CHOL 168 04/19/2011   Lab Results  Component Value Date   HDL 74 05/08/2012   HDL 76 03/02/2012   HDL 81 0/86/5784   Lab Results  Component Value  Date   LDLCALC 112* 05/08/2012   LDLCALC 122* 03/02/2012   LDLCALC 80 04/19/2011   Lab Results  Component Value Date   TRIG 66 05/08/2012   TRIG 46 03/02/2012   TRIG 35 04/19/2011   Lab Results  Component Value Date   CHOLHDL 2.7 05/08/2012   CHOLHDL 2.7 03/02/2012   CHOLHDL 2.1 04/19/2011   No results found for this basename: LDLDIRECT       EKG: Sinus rhythm, rate 82 bpm, one PVC, possible right atrial enlargement, axis within normal limits, intervals within normal limits, no acute ST-T wave changes.  Echo: see HPI   ASSESSMENT AND PLAN:  1. Mild Pulmonary HTN: repeat echocardiogram to assess for any interval changes. Currently on Revatio, which was  prescribed by Dr. Juanetta Gosling. 2. Essential HTN: controlled on Amlodipine, Hyzaar, and Toprol-XL. 3. Hypercholesterolemia: uses Tricor.  Signed: Prentice Docker, M.D., F.A.C.C. 01/26/2013, 8:39 AM

## 2013-01-26 NOTE — Patient Instructions (Addendum)
Your physician recommends that you schedule a follow-up appointment in: ONE YEAR  Your physician has requested that you have an echocardiogram. Echocardiography is a painless test that uses sound waves to create images of your heart. It provides your doctor with information about the size and shape of your heart and how well your heart's chambers and valves are working. This procedure takes approximately one hour. There are no restrictions for this procedure.    

## 2013-01-28 NOTE — Telephone Encounter (Signed)
Called pt back again no answer

## 2013-01-29 ENCOUNTER — Ambulatory Visit (HOSPITAL_COMMUNITY)
Admission: RE | Admit: 2013-01-29 | Discharge: 2013-01-29 | Disposition: A | Discharge status: Home / Self Care | Payer: Medicare Other | Source: Ambulatory Visit | Attending: Cardiovascular Disease | Admitting: Cardiovascular Disease

## 2013-01-29 DIAGNOSIS — R0609 Other forms of dyspnea: Secondary | ICD-10-CM | POA: Insufficient documentation

## 2013-01-29 DIAGNOSIS — E7800 Pure hypercholesterolemia, unspecified: Secondary | ICD-10-CM

## 2013-01-29 DIAGNOSIS — R0989 Other specified symptoms and signs involving the circulatory and respiratory systems: Secondary | ICD-10-CM | POA: Insufficient documentation

## 2013-01-29 DIAGNOSIS — I272 Pulmonary hypertension, unspecified: Secondary | ICD-10-CM

## 2013-01-29 DIAGNOSIS — I10 Essential (primary) hypertension: Secondary | ICD-10-CM | POA: Insufficient documentation

## 2013-01-29 NOTE — Telephone Encounter (Signed)
Called back again- no answer.

## 2013-01-29 NOTE — Progress Notes (Signed)
*  PRELIMINARY RESULTS* Echocardiogram 2D Echocardiogram has been performed.  [name] 01/29/2013, 3:06 PM

## 2013-02-25 ENCOUNTER — Other Ambulatory Visit: Payer: Self-pay | Admitting: Family Medicine

## 2013-03-15 ENCOUNTER — Telehealth: Payer: Self-pay | Admitting: Family Medicine

## 2013-03-15 NOTE — Telephone Encounter (Signed)
Med requires pa.  In process

## 2013-03-16 NOTE — Telephone Encounter (Signed)
Noted. Will notify pharmacy

## 2013-03-16 NOTE — Telephone Encounter (Signed)
This needs to be sent to Dr Juanetta Gosling , he is the original prescriber, we do not Pa this, thanks

## 2013-04-21 ENCOUNTER — Other Ambulatory Visit: Payer: Self-pay | Admitting: Family Medicine

## 2013-04-21 LAB — LIPID PANEL
Cholesterol: 194 mg/dL (ref 0–200)
Triglycerides: 48 mg/dL (ref <150)
VLDL: 10 mg/dL (ref 0–40)

## 2013-04-21 LAB — COMPLETE METABOLIC PANEL WITH GFR
AST: 17 U/L (ref 0–37)
BUN: 14 mg/dL (ref 6–23)
Calcium: 9.3 mg/dL (ref 8.4–10.5)
Chloride: 103 mEq/L (ref 96–112)
Creat: 0.88 mg/dL (ref 0.50–1.10)
GFR, Est African American: 80 mL/min

## 2013-04-27 ENCOUNTER — Other Ambulatory Visit: Payer: Self-pay | Admitting: Family Medicine

## 2013-04-28 ENCOUNTER — Encounter (INDEPENDENT_AMBULATORY_CARE_PROVIDER_SITE_OTHER): Payer: Self-pay

## 2013-04-28 ENCOUNTER — Encounter: Payer: Self-pay | Admitting: Family Medicine

## 2013-04-28 ENCOUNTER — Ambulatory Visit (INDEPENDENT_AMBULATORY_CARE_PROVIDER_SITE_OTHER): Payer: Medicare Other | Admitting: Family Medicine

## 2013-04-28 VITALS — BP 148/84 | HR 86 | Resp 16 | Wt 201.0 lb

## 2013-04-28 DIAGNOSIS — I10 Essential (primary) hypertension: Secondary | ICD-10-CM

## 2013-04-28 DIAGNOSIS — G4730 Sleep apnea, unspecified: Secondary | ICD-10-CM | POA: Insufficient documentation

## 2013-04-28 DIAGNOSIS — Z1211 Encounter for screening for malignant neoplasm of colon: Secondary | ICD-10-CM

## 2013-04-28 DIAGNOSIS — R7309 Other abnormal glucose: Secondary | ICD-10-CM

## 2013-04-28 DIAGNOSIS — E785 Hyperlipidemia, unspecified: Secondary | ICD-10-CM

## 2013-04-28 DIAGNOSIS — R7302 Impaired glucose tolerance (oral): Secondary | ICD-10-CM

## 2013-04-28 DIAGNOSIS — E669 Obesity, unspecified: Secondary | ICD-10-CM

## 2013-04-28 DIAGNOSIS — F329 Major depressive disorder, single episode, unspecified: Secondary | ICD-10-CM

## 2013-04-28 DIAGNOSIS — Z13820 Encounter for screening for osteoporosis: Secondary | ICD-10-CM

## 2013-04-28 DIAGNOSIS — K5730 Diverticulosis of large intestine without perforation or abscess without bleeding: Secondary | ICD-10-CM

## 2013-04-28 DIAGNOSIS — I2789 Other specified pulmonary heart diseases: Secondary | ICD-10-CM

## 2013-04-28 NOTE — Assessment & Plan Note (Signed)
Dr Gerilyn Pilgrim to evalaute and treat sleep apnea, was dx by [facility] cardiology in the past, has not been re evaluated for years

## 2013-04-28 NOTE — Patient Instructions (Addendum)
Annual wellness end February, call if you need me before  Rectal exam today.  Blood pressure and lab  work is excellent.    You will be referred for a bone density scan, to see Dr Juanetta Gosling and to see Dr Gerilyn Pilgrim   Continue to keep active and to eat healthily, try to lose 1 to 2 pounds per month  You are referred fo colonoscopy next year with Dr Darrick Penna, in February, when due

## 2013-04-29 ENCOUNTER — Other Ambulatory Visit: Payer: Self-pay

## 2013-04-29 MED ORDER — POTASSIUM CHLORIDE CRYS ER 20 MEQ PO TBCR: EXTENDED_RELEASE_TABLET | ORAL | Status: DC

## 2013-05-01 NOTE — Assessment & Plan Note (Signed)
Controlled, no change in medication  

## 2013-05-01 NOTE — Assessment & Plan Note (Signed)
Unchnaged. Patient re-educated about  the importance of commitment to a  minimum of 150 minutes of exercise per week. The importance of healthy food choices with portion control discussed. Encouraged to start a food diary, count calories and to consider  joining a support group. Sample diet sheets offered. Goals set by the patient for the next several months.    

## 2013-05-01 NOTE — Progress Notes (Signed)
  Subjective:    Patient ID: [name], female    DOB: 02/08/1946, 67 y.o.   MRN: [id number]  HPI The PT is here for follow up and re-evaluation of chronic medical conditions, medication management and review of any available recent lab and radiology data.  Preventive health is updated, specifically  Cancer screening and Immunization.    The PT denies any adverse reactions to current medications since the last visit.  There are no new concerns.  There are no specific complaints       Review of Systems See HPI Denies recent fever or chills. Denies sinus pressure, nasal congestion, ear pain or sore throat. Denies chest congestion, productive cough or wheezing. Denies chest pains, palpitations and leg swelling Denies abdominal pain, nausea, vomiting,diarrhea or constipation.   Denies dysuria, frequency, hesitancy or incontinence. Denies joint pain, swelling and limitation in mobility. Denies headaches, seizures, numbness, or tingling. Denies depression, anxiety or insomnia. Denies skin break down or rash.        Objective:   Physical Exam Patient alert and oriented and in no cardiopulmonary distress.  HEENT: No facial asymmetry, EOMI, no sinus tenderness,  oropharynx pink and moist.  Neck supple no adenopathy.  Chest: Clear to auscultation bilaterally.  CVS: S1, S2 no murmurs, no S3.  ABD: Soft non tender. Bowel sounds normal. Rectal: no mass heme negative stool Ext: No edema  MS: Adequate ROM spine, shoulders, hips and knees.  Skin: Intact, no ulcerations or rash noted.  Psych: Good eye contact, normal affect. Memory intact not anxious or depressed appearing.  CNS: CN 2-12 intact, power, tone and sensation normal throughout.        Assessment & Plan:

## 2013-05-01 NOTE — Assessment & Plan Note (Signed)
Remains normo glycemic off of medication. Patient educated about the importance of limiting  Carbohydrate intake , the need to commit to daily physical activity for a minimum of 30 minutes , and to commit weight loss. The fact that changes in all these areas will reduce or eliminate all together the development of diabetes is stressed.

## 2013-05-01 NOTE — Assessment & Plan Note (Signed)
Needs re eval by pulmonary to direct ongoing med management, referral entered. No c/o excessive exertional fatigue, actually involved in group exercise activity at least 3 days per week which she enjoys

## 2013-05-01 NOTE — Assessment & Plan Note (Signed)
Controlled, no change in medication DASH diet and commitment to daily physical activity for a minimum of 30 minutes discussed and encouraged, as a part of hypertension management. The importance of attaining a healthy weight is also discussed.  

## 2013-05-01 NOTE — Assessment & Plan Note (Signed)
Colonoscopy due in Feb 2015, referral entered

## 2013-05-01 NOTE — Assessment & Plan Note (Signed)
Controlled, no change in medication Hyperlipidemia:Low fat diet discussed and encouraged.  \ 

## 2013-05-03 ENCOUNTER — Ambulatory Visit (HOSPITAL_COMMUNITY)
Admission: RE | Admit: 2013-05-03 | Discharge: 2013-05-03 | Disposition: A | Discharge status: Home / Self Care | Payer: Medicare Other | Source: Ambulatory Visit | Attending: Family Medicine | Admitting: Family Medicine

## 2013-05-03 DIAGNOSIS — Z13820 Encounter for screening for osteoporosis: Secondary | ICD-10-CM

## 2013-05-03 DIAGNOSIS — M899 Disorder of bone, unspecified: Secondary | ICD-10-CM | POA: Insufficient documentation

## 2013-05-06 ENCOUNTER — Other Ambulatory Visit: Payer: Self-pay | Admitting: Family Medicine

## 2013-05-17 ENCOUNTER — Other Ambulatory Visit: Payer: Self-pay

## 2013-05-17 DIAGNOSIS — J441 Chronic obstructive pulmonary disease with (acute) exacerbation: Secondary | ICD-10-CM

## 2013-05-17 MED ORDER — RISEDRONATE SODIUM 35 MG PO TABS: 35.0000 mg | ORAL_TABLET | ORAL | Status: DC

## 2013-05-27 ENCOUNTER — Other Ambulatory Visit (HOSPITAL_COMMUNITY): Payer: Self-pay

## 2013-05-27 DIAGNOSIS — G4730 Sleep apnea, unspecified: Secondary | ICD-10-CM

## 2013-05-28 ENCOUNTER — Telehealth: Payer: Self-pay

## 2013-05-28 NOTE — Telephone Encounter (Signed)
Pt was referred by Dr. Simpson for screening colonoscopy. LMOM for a return call.  

## 2013-06-03 ENCOUNTER — Encounter (HOSPITAL_COMMUNITY): Payer: Medicare Other

## 2013-06-08 ENCOUNTER — Encounter: Payer: Self-pay | Admitting: Family Medicine

## 2013-06-10 ENCOUNTER — Ambulatory Visit (HOSPITAL_COMMUNITY)
Admission: RE | Admit: 2013-06-10 | Discharge: 2013-06-10 | Disposition: A | Discharge status: Home / Self Care | Payer: Medicare Other | Source: Ambulatory Visit | Attending: Pulmonary Disease | Admitting: Pulmonary Disease

## 2013-06-10 DIAGNOSIS — J449 Chronic obstructive pulmonary disease, unspecified: Secondary | ICD-10-CM | POA: Insufficient documentation

## 2013-06-10 DIAGNOSIS — J4489 Other specified chronic obstructive pulmonary disease: Secondary | ICD-10-CM | POA: Insufficient documentation

## 2013-06-10 LAB — PULMONARY FUNCTION TEST
DL/VA: 5.31 ml/min/mmHg/L
DLCO cor % pred: 56 %
DLCO cor: 10.67 ml/min/mmHg
DLCO unc: 10.54 ml/min/mmHg
FEF 25-75 Post: 2.24 L/sec
FEF 25-75 Pre: 1.68 L/sec
FEF2575-%Change-Post: 33 %
FEF2575-%Pred-Pre: 105 %
FEV1-%Change-Post: 6 %
FEV1-%Pred-Pre: 77 %
FEV1-Post: 1.34 L
FEV1-Pre: 1.25 L
FEV1FVC-%Pred-Pre: 112 %
FEV6-%Change-Post: 8 %
FEV6-%Pred-Post: 77 %
FVC-%Change-Post: 8 %
FVC-%Pred-Pre: 68 %
FVC-Post: 1.54 L
Post FEV1/FVC ratio: 87 %
RV: 1.23 L

## 2013-06-10 LAB — BLOOD GAS, ARTERIAL
Acid-Base Excess: 0.5 mmol/L (ref 0.0–2.0)
O2 Saturation: 97.5 %

## 2013-06-10 MED ORDER — ALBUTEROL SULFATE (5 MG/ML) 0.5% IN NEBU
2.5000 mg | INHALATION_SOLUTION | Freq: Once | RESPIRATORY_TRACT | Status: AC
  Administered 2013-06-10: 2.5 mg via RESPIRATORY_TRACT

## 2013-06-13 NOTE — Procedures (Signed)
NAMELAURIS, KEEPERS               ACCOUNT NO.:  [id number]  MEDICAL RECORD NO.:  [id number]  LOCATION:  RESP                          FACILITY:  [facility]  PHYSICIAN:  Hannibal Skalla L. Juanetta Gosling, M.D.DATE OF BIRTH:  1945/12/05  DATE OF PROCEDURE: DATE OF DISCHARGE:  06/10/2013                           PULMONARY FUNCTION TEST   Reason for pulmonary function testing is COPD.  1. Spirometry shows a mild ventilatory defect without definite airflow     obstruction. 2. Lung volumes are mildly to moderately reduced. 3. DLCO is moderately reduced. 4. Airway resistance is slightly elevated. 5. There is improvement with inhaled bronchodilator, but it does not     reach the level of significance. 6. Arterial blood gas is normal.     Belen Pesch L. Juanetta Gosling, M.D.     ELH/MEDQ  D:  06/13/2013  T:  06/13/2013  Job:  [id number]

## 2013-06-18 ENCOUNTER — Other Ambulatory Visit: Payer: Self-pay | Admitting: Family Medicine

## 2013-06-21 NOTE — Telephone Encounter (Signed)
Pt has not responded to letter or phone call. Routed back to [facility] in referrals,

## 2013-06-22 ENCOUNTER — Encounter: Payer: Self-pay | Admitting: Family Medicine

## 2013-06-25 ENCOUNTER — Ambulatory Visit: Payer: Medicare Other | Attending: Neurology | Admitting: Sleep Medicine

## 2013-06-25 DIAGNOSIS — Z6841 Body Mass Index (BMI) 40.0 and over, adult: Secondary | ICD-10-CM | POA: Insufficient documentation

## 2013-06-25 DIAGNOSIS — R5381 Other malaise: Secondary | ICD-10-CM | POA: Insufficient documentation

## 2013-06-25 DIAGNOSIS — G4730 Sleep apnea, unspecified: Secondary | ICD-10-CM

## 2013-06-25 DIAGNOSIS — R0989 Other specified symptoms and signs involving the circulatory and respiratory systems: Secondary | ICD-10-CM | POA: Insufficient documentation

## 2013-06-25 DIAGNOSIS — E669 Obesity, unspecified: Secondary | ICD-10-CM | POA: Insufficient documentation

## 2013-06-25 DIAGNOSIS — R0609 Other forms of dyspnea: Secondary | ICD-10-CM | POA: Insufficient documentation

## 2013-07-06 NOTE — Procedures (Signed)
[facility] Zaryah Seckel A. Gerilyn Pilgrim, MD     [email or web address]        [name], [name]               ACCOUNT NO.:  [id number]  MEDICAL RECORD NO.:  [id number]          PATIENT TYPE:  OUT  LOCATION:  SLEEP LAB                     FACILITY:  [facility]  PHYSICIAN:  Mandisa Persinger A. Gerilyn Pilgrim, M.D. DATE OF BIRTH:  05-06-1946  DATE OF STUDY:                           NOCTURNAL POLYSOMNOGRAM  REFERRING PHYSICIAN:  Kash Mothershead A. Gerilyn Pilgrim, M.D.  INDICATION FOR STUDY:  A 67 year old who presents with obesity, snoring, and fatigue.  The study is being done to evaluate for obstructive sleep apnea syndrome.  EPWORTH SLEEPINESS SCORE:  Epworth Sleepiness Scale, 1.  BMI:  50.  MEDICATIONS:  Hyzaar, metoprolol, aspirin, pravastatin, Norvasc, niacin, Zoloft, potassium, fenofibrate, calcium.  SLEEP ARCHITECTURE:  The total recording time is 426 minutes.  Sleep efficiency 86%.  Sleep latency 38 minutes.  REM latency 138 minutes. Stage N1 3%, N2 67%, N3 80%, and REM sleep 12%.  RESPIRATORY DATA:  The study was done with 2 L nasal oxygen.  Baseline oxygen saturation is 98%.  Lowest saturation 93% during non-REM sleep. Diagnostic AHI 1 and RDI also 1.  LIMB MOVEMENT SUMMARY:  PLM index 0.  ELECTROCARDIOGRAM SUMMARY:  Average heart rate is 84 with no significant dysrhythmias observed.   IMPRESSIONS-RECOMMENDATIONS:  Unremarkable nocturnal polysomnography.     Tongela Encinas A. Gerilyn Pilgrim, M.D.   KAD/MEDQ  D:  07/06/2013 09:02:37  T:  07/06/2013 09:14:00  Job:  [id number]

## 2013-07-23 ENCOUNTER — Telehealth: Payer: Self-pay

## 2013-07-23 MED ORDER — PREDNISONE (PAK) 5 MG PO TABS: 5.0000 mg | ORAL_TABLET | ORAL | Status: DC

## 2013-07-23 MED ORDER — IBUPROFEN 800 MG PO TABS: ORAL_TABLET | ORAL | Status: DC

## 2013-07-23 MED ORDER — METHOCARBAMOL 500 MG PO TABS: ORAL_TABLET | ORAL | Status: DC

## 2013-07-23 MED ORDER — TRAMADOL HCL 50 MG PO TABS: ORAL_TABLET | ORAL | Status: DC
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
## 2013-08-14 ENCOUNTER — Other Ambulatory Visit: Payer: Self-pay | Admitting: Family Medicine

## 2013-08-17 ENCOUNTER — Other Ambulatory Visit: Payer: Self-pay

## 2013-08-17 MED ORDER — RISEDRONATE SODIUM 150 MG PO TABS: 150.0000 mg | ORAL_TABLET | ORAL | Status: DC

## 2013-09-11 ENCOUNTER — Other Ambulatory Visit: Payer: Self-pay | Admitting: Family Medicine

## 2013-09-15 ENCOUNTER — Encounter: Payer: Self-pay | Admitting: Family Medicine

## 2013-09-15 ENCOUNTER — Ambulatory Visit (INDEPENDENT_AMBULATORY_CARE_PROVIDER_SITE_OTHER): Payer: Medicare Other | Admitting: Family Medicine

## 2013-09-15 ENCOUNTER — Encounter: Payer: Self-pay | Admitting: Gastroenterology

## 2013-09-15 ENCOUNTER — Encounter (INDEPENDENT_AMBULATORY_CARE_PROVIDER_SITE_OTHER): Payer: Self-pay

## 2013-09-15 VITALS — BP 170/88 | HR 73 | Resp 16 | Ht 60.0 in | Wt 201.0 lb

## 2013-09-15 DIAGNOSIS — Z0000 Encounter for general adult medical examination without abnormal findings: Secondary | ICD-10-CM

## 2013-09-15 DIAGNOSIS — E785 Hyperlipidemia, unspecified: Secondary | ICD-10-CM

## 2013-09-15 DIAGNOSIS — Z139 Encounter for screening, unspecified: Secondary | ICD-10-CM

## 2013-09-15 DIAGNOSIS — K219 Gastro-esophageal reflux disease without esophagitis: Secondary | ICD-10-CM | POA: Insufficient documentation

## 2013-09-15 DIAGNOSIS — M5410 Radiculopathy, site unspecified: Secondary | ICD-10-CM | POA: Insufficient documentation

## 2013-09-15 DIAGNOSIS — M549 Dorsalgia, unspecified: Secondary | ICD-10-CM | POA: Insufficient documentation

## 2013-09-15 DIAGNOSIS — R7302 Impaired glucose tolerance (oral): Secondary | ICD-10-CM

## 2013-09-15 DIAGNOSIS — R1319 Other dysphagia: Secondary | ICD-10-CM

## 2013-09-15 DIAGNOSIS — R7301 Impaired fasting glucose: Secondary | ICD-10-CM

## 2013-09-15 DIAGNOSIS — IMO0002 Reserved for concepts with insufficient information to code with codable children: Secondary | ICD-10-CM

## 2013-09-15 DIAGNOSIS — R7309 Other abnormal glucose: Secondary | ICD-10-CM

## 2013-09-15 DIAGNOSIS — I10 Essential (primary) hypertension: Secondary | ICD-10-CM

## 2013-09-15 LAB — CBC WITH DIFFERENTIAL/PLATELET
BASOS ABS: 0 10*3/uL (ref 0.0–0.1)
Basophils Relative: 0 % (ref 0–1)
EOS ABS: 0.1 10*3/uL (ref 0.0–0.7)
EOS PCT: 1 % (ref 0–5)
HCT: 39.7 % (ref 36.0–46.0)
Hemoglobin: 13 g/dL (ref 12.0–15.0)
LYMPHS PCT: 39 % (ref 12–46)
Lymphs Abs: 3.5 10*3/uL (ref 0.7–4.0)
MCH: 28.6 pg (ref 26.0–34.0)
MCHC: 32.7 g/dL (ref 30.0–36.0)
MCV: 87.4 fL (ref 78.0–100.0)
Monocytes Absolute: 0.6 10*3/uL (ref 0.1–1.0)
Monocytes Relative: 7 % (ref 3–12)
NEUTROS PCT: 53 % (ref 43–77)
Neutro Abs: 4.8 10*3/uL (ref 1.7–7.7)
PLATELETS: 220 10*3/uL (ref 150–400)
RBC: 4.54 MIL/uL (ref 3.87–5.11)
RDW: 14.8 % (ref 11.5–15.5)
WBC: 9.1 10*3/uL (ref 4.0–10.5)

## 2013-09-15 MED ORDER — GABAPENTIN 100 MG PO CAPS: ORAL_CAPSULE | ORAL | Status: DC

## 2013-09-15 MED ORDER — TRAMADOL HCL 50 MG PO TABS: ORAL_TABLET | ORAL | Status: DC

## 2013-09-15 NOTE — Progress Notes (Signed)
Subjective:    Patient ID: Mallory GrammesDiana M Tersigni, female    DOB: [birth year], 68 y.o.   MRN: [id number]  HPI Preventive Screening-Counseling & Management   Patient present here today for a Medicare annual wellness visit. 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 Recent trama to right great toe, coffee container  fell on it bruising present under the toenail   Current Problems (verified)   Medications Prior to Visit Allergies (verified)   PAST HISTORY  Family History  Social History Married to a spouse who is an abusive alcoholic, mother of 2 adult children, both live outside of the home. Ha done extremely well becoming involved with senior citizens as well a  Geophysicist/field seismologistilver sneakers program   Risk Factors  Current exercise habits:  5 times per week silver sneakers, she absolutely enjoys this  Dietary issues discussed:dash  Diet , low carb , low fat   Cardiac risk factors: none significant  Depression Screen  (Note: if answer to either of the following is "Yes", a more complete depression screening is indicated)   Over the past two weeks, have you felt down, depressed or hopeless? No  Over the past two weeks, have you felt little interest or pleasure in doing things? No  Have you lost interest or pleasure in daily life? No  Do you often feel hopeless? No  Do you cry easily over simple problems? No   Activities of Daily Living  In your present state of health, do you have any difficulty performing the following activities?  Driving?: No Managing money?: No Feeding yourself?:No Getting from bed to chair?:No Climbing a flight of stairs?:yes has to hold on climbing upstairs Preparing food and eating?:No Bathing or showering?:No Getting dressed?:No Getting to the toilet?:No Using the toilet?:No Moving  around from place to place?: No  Fall Risk Assessment In the past year have you fallen or had a near fall?:No Are you currently taking any medications that make you dizzy   Hearing Difficulties: No Do you often ask people to speak up or repeat themselves?:No Do you experience ringing or noises in your ears?:No Do you have difficulty understanding soft or whispered voices?:No  Cognitive Testing  Alert? Yes Normal Appearance?Yes  Oriented to person? Yes Place? Yes  Time? Yes  Displays appropriate judgment?Yes  Can read the correct time from a watch face? yes Are you having problems remembering things?No  Advanced Directives have been discussed with the patient?Yes , full code   List the Names of Other Physician/Practitioners you currently use: Dr Juanetta GoslingHawkins, lungs, cardiology, Adolph PollackLe Bauer, Dr Lita MainsHaines,    Indicate any recent Medical Services you may have received from other than [facility] providers in the past year (date may be approximate).   Assessment:    Annual Wellness Exam   Plan:    During the course of the visit the patient was educated and counseled about appropriate screening and preventive services including:  A healthy diet is rich in fruit, vegetables and whole grains. Poultry fish, nuts and beans are a healthy choice for protein rather then red meat. A low sodium diet and drinking 64 ounces of water daily is generally recommended. Oils and sweet should be limited. Carbohydrates especially for those who are diabetic or overweight, should be limited to 30-45 gram per meal. It is important to eat on a regular  schedule, at least 3 times daily. Snacks should be primarily fruits, vegetables or nuts. It is important that you exercise regularly at least 30 minutes 5 times a week. If you develop chest pain, have severe difficulty breathing, or feel very tired, stop exercising immediately and seek medical attention  Immunization reviewed and updated. Cancer screening reviewed and  updated    Patient Instructions (the written plan) was given to the patient.  Medicare Attestation  I have personally reviewed:  The patient's medical and social history  Their use of alcohol, tobacco or illicit drugs  Their current medications and supplements  The patient's functional ability including ADLs,fall risks, home safety risks, cognitive, and hearing and visual impairment  Diet and physical activities  Evidence for depression or mood disorders  The patient's weight, height, BMI, and visual acuity have been recorded in the chart. I have made referrals, counseling, and provided education to the patient based on review of the above and I have provided the patient with a written personalized care plan for preventive services.      Review of Systems     Objective:   Physical Exam        Assessment & Plan:  Routine general medical examination at a health care facility Annual exam as documented. Counseling done  re healthy lifestyle involving commitment to 150 minutes exercise per week, heart healthy diet, and attaining healthy weight.The importance of adequate sleep also discussed. Regular seat belt use and safe storage  of firearms if patient has them, is also discussed. Changes in health habits are decided on by the patient with goals and time frames  set for achieving them. Immunization and cancer screening needs are specifically addressed at this visit.   GERD Reports uncontrolled symptoms of reflux with difficulty swallowing, needs GI re eval

## 2013-09-15 NOTE — Patient Instructions (Addendum)
F/u in 3 .5 weeks, bring all medication you have that you are taking  For cholesterol niaspan TWO tablets and one fenofibrate  For blood pressure ONE amlodipine $RemoveBefore'10mg'[id number]$  and ONE metoprolol tablet  You need to call and schedule appt to see Dr Oneida Alar for your colonoscopy, you will get the letter with the number to call I have also referred you to her  for reflux and difficulty swallowing   Labs today HBA1C, lipid, cmp and EGFr, CBC, TSH, Vit D  Gabapentin one  at night for back and leg pain

## 2013-09-15 NOTE — Assessment & Plan Note (Signed)
Annual exam as documented. Counseling done  re healthy lifestyle involving commitment to 150 minutes exercise per week, heart healthy diet, and attaining healthy weight.The importance of adequate sleep also discussed. Regular seat belt use and safe storage  of firearms if patient has them, is also discussed. Changes in health habits are decided on by the patient with goals and time frames  set for achieving them. Immunization and cancer screening needs are specifically addressed at this visit.  

## 2013-09-16 LAB — HEMOGLOBIN A1C
HEMOGLOBIN A1C: 5.8 % — AB (ref <5.7)
MEAN PLASMA GLUCOSE: 120 mg/dL — AB (ref <117)

## 2013-09-16 LAB — COMPLETE METABOLIC PANEL WITH GFR
ALBUMIN: 4.1 g/dL (ref 3.5–5.2)
ALT: 10 U/L (ref 0–35)
AST: 17 U/L (ref 0–37)
Alkaline Phosphatase: 64 U/L (ref 39–117)
BUN: 12 mg/dL (ref 6–23)
CALCIUM: 9.6 mg/dL (ref 8.4–10.5)
CHLORIDE: 105 meq/L (ref 96–112)
CO2: 27 meq/L (ref 19–32)
CREATININE: 0.71 mg/dL (ref 0.50–1.10)
GFR, Est Non African American: >89 mL/min
Glucose, Bld: 82 mg/dL (ref 70–99)
POTASSIUM: 3.7 meq/L (ref 3.5–5.3)
Sodium: 141 mEq/L (ref 135–145)
Total Bilirubin: 0.4 mg/dL (ref 0.2–1.2)
Total Protein: 7.3 g/dL (ref 6.0–8.3)

## 2013-09-16 LAB — LIPID PANEL
CHOLESTEROL: 222 mg/dL — AB (ref 0–200)
HDL: 74 mg/dL (ref >39)
LDL Cholesterol: 130 mg/dL — ABNORMAL HIGH (ref 0–99)
TRIGLYCERIDES: 88 mg/dL (ref <150)
Total CHOL/HDL Ratio: 3 Ratio
VLDL: 18 mg/dL (ref 0–40)

## 2013-09-16 LAB — TSH: TSH: 2.171 u[IU]/mL (ref 0.350–4.500)

## 2013-09-16 LAB — VITAMIN D 25 HYDROXY (VIT D DEFICIENCY, FRACTURES): Vit D, 25-Hydroxy: 23 ng/mL — ABNORMAL LOW (ref 30–89)

## 2013-09-16 NOTE — Assessment & Plan Note (Signed)
Reports uncontrolled symptoms of reflux with difficulty swallowing, needs GI re eval

## 2013-10-05 ENCOUNTER — Ambulatory Visit: Payer: Medicare Other | Admitting: Gastroenterology

## 2013-10-05 ENCOUNTER — Encounter: Payer: Self-pay | Admitting: Family Medicine

## 2013-10-05 ENCOUNTER — Ambulatory Visit (INDEPENDENT_AMBULATORY_CARE_PROVIDER_SITE_OTHER): Payer: Medicare Other | Admitting: Family Medicine

## 2013-10-05 VITALS — BP 160/92 | HR 95 | Resp 18 | Ht 60.0 in | Wt 200.1 lb

## 2013-10-05 DIAGNOSIS — I10 Essential (primary) hypertension: Secondary | ICD-10-CM

## 2013-10-05 DIAGNOSIS — E669 Obesity, unspecified: Secondary | ICD-10-CM

## 2013-10-05 DIAGNOSIS — E785 Hyperlipidemia, unspecified: Secondary | ICD-10-CM

## 2013-10-05 DIAGNOSIS — F3289 Other specified depressive episodes: Secondary | ICD-10-CM

## 2013-10-05 DIAGNOSIS — F329 Major depressive disorder, single episode, unspecified: Secondary | ICD-10-CM

## 2013-10-05 DIAGNOSIS — R7309 Other abnormal glucose: Secondary | ICD-10-CM

## 2013-10-05 DIAGNOSIS — R7302 Impaired glucose tolerance (oral): Secondary | ICD-10-CM

## 2013-10-05 MED ORDER — VALSARTAN-HYDROCHLOROTHIAZIDE 80-12.5 MG PO TABS: 1.0000 | ORAL_TABLET | Freq: Every day | ORAL | Status: DC

## 2013-10-05 NOTE — Progress Notes (Signed)
   Subjective:    Patient ID: Mallory GrammesDiana M Mings, female    DOB: October 08, 1945, 68 y.o.   MRN: [id number]  HPI Pt in primarily for re evaluation of uncontrolled blood pressure. She denies any adverse s/e from her new medication and has increased vegetable and reduced salt intake. Unfortunately no significant in BP at this time   Review of Systems See HPI Denies recent fever or chills. Denies sinus pressure, nasal congestion, ear pain or sore throat. Denies chest congestion, productive cough or wheezing. Denies chest pains, palpitations and leg swelling Denies abdominal pain, nausea, vomiting,diarrhea or constipation.   Denies dysuria, frequency, hesitancy or incontinence. C/o chronic  joint pain, swelling and limitation in mobility. Denies headaches, seizures, numbness, or tingling. Denies uncontrolled depression, anxiety or insomnia. Denies skin break down or rash.        Objective:   Physical Exam  BP 160/92  Pulse 95  Resp 18  Ht 5' (1.524 m)  Wt 200 lb 1.9 oz (90.774 kg)  BMI 39.08 kg/m2  SpO2 98% Patient alert and oriented and in no cardiopulmonary distress.  HEENT: No facial asymmetry, EOMI,   oropharynx pink and moist.  Neck supple no JVD, no mass.  Chest: Clear to auscultation bilaterally.  CVS: S1, S2 no murmurs, no S3.  ABD: Soft non tender.   Ext: No edema  MS: Adequate ROM spine, shoulders, hips and knees.  Skin: Intact, no ulcerations or rash noted.  Psych: Good eye contact, normal affect. Memory intact not anxious or depressed appearing.  CNS: CN 2-12 intact, power,  normal throughout.no focal deficits noted.       Assessment & Plan:  HYPERTENSION Uncontrolled, new med started for BP DASH diet and commitment to daily physical activity for a minimum of 30 minutes discussed and encouraged, as a part of hypertension management. The importance of attaining a healthy weight is also discussed.   OBESITY Improved. Pt applauded on succesful weight  loss through lifestyle change, and encouraged to continue same. Weight loss goal set for the next several months.   DEPRESSION Controlled, no change in medication   HYPERLIPIDEMIA Hyperlipidemia:Low fat diet discussed and encouraged.  Updated lab needed at/ before next visit.   IGT (impaired glucose tolerance) Patient educated about the importance of limiting  Carbohydrate intake , the need to commit to daily physical activity for a minimum of 30 minutes , and to commit weight loss. The fact that changes in all these areas will reduce or eliminate all together the development of diabetes is stressed.   Updated lab needed at/ before next visit.

## 2013-10-05 NOTE — Patient Instructions (Addendum)
F/u early June  Fall Prevention and Home Safety Falls cause injuries and can affect all age groups. It is possible to prevent falls.  HOW TO PREVENT FALLS  Wear shoes with rubber soles that do not have an opening for your toes.  Keep the inside and outside of your house well lit.  Use night lights throughout your home.  Remove clutter from floors.  Clean up floor spills.  Remove throw rugs or fasten them to the floor with carpet tape.  Do not place electrical cords across pathways.  Put grab bars by your tub, shower, and toilet. Do not use towel bars as grab bars.  Put handrails on both sides of the stairway. Fix loose handrails.  Do not climb on stools or stepladders, if possible.  Do not wax your floors.  Repair uneven or unsafe sidewalks, walkways, or stairs.  Keep items you use a lot within reach.  Be aware of pets.  Keep emergency numbers next to the telephone.  Put smoke detectors in your home and near bedrooms. Ask your doctor what other things you can do to prevent falls. Document Released: 05/04/2009 Document Revised: 01/07/2012 Document Reviewed: 10/08/2011 [facility] Patient Information 2014 [place]. F./U early June, call if you need me before  Blood pressure remains high  New is valsartan/HCTZ one daily, pls, call tomorrow if not covered.   GREAT that your medication is in order!!!  Fasting lipid, cmp and hBA1C  May 26 or after

## 2013-10-11 ENCOUNTER — Other Ambulatory Visit: Payer: Self-pay | Admitting: Family Medicine

## 2013-10-29 ENCOUNTER — Encounter: Payer: Self-pay | Admitting: Gastroenterology

## 2013-10-29 ENCOUNTER — Encounter (INDEPENDENT_AMBULATORY_CARE_PROVIDER_SITE_OTHER): Payer: Self-pay

## 2013-10-29 ENCOUNTER — Ambulatory Visit (INDEPENDENT_AMBULATORY_CARE_PROVIDER_SITE_OTHER): Payer: Medicare Other | Admitting: Gastroenterology

## 2013-10-29 ENCOUNTER — Other Ambulatory Visit: Payer: Self-pay | Admitting: Gastroenterology

## 2013-10-29 VITALS — BP 152/71 | HR 75 | Temp 98.4°F | Ht 60.0 in | Wt 198.0 lb

## 2013-10-29 DIAGNOSIS — R1319 Other dysphagia: Secondary | ICD-10-CM

## 2013-10-29 DIAGNOSIS — R1310 Dysphagia, unspecified: Secondary | ICD-10-CM | POA: Insufficient documentation

## 2013-10-29 DIAGNOSIS — R1314 Dysphagia, pharyngoesophageal phase: Secondary | ICD-10-CM

## 2013-10-29 DIAGNOSIS — K219 Gastro-esophageal reflux disease without esophagitis: Secondary | ICD-10-CM

## 2013-10-29 DIAGNOSIS — Z1211 Encounter for screening for malignant neoplasm of colon: Secondary | ICD-10-CM

## 2013-10-29 MED ORDER — PEG 3350-KCL-NA BICARB-NACL 420 G PO SOLR: 4000.0000 mL | ORAL | Status: DC

## 2013-10-29 NOTE — Patient Instructions (Signed)
1. Upper endoscopy and colonoscopy with Dr. Darrick Pennafields as schedule. Please see separate instructions

## 2013-10-29 NOTE — Assessment & Plan Note (Addendum)
Due for 10 year screening colonoscopy.  I have discussed the risks, alternatives, benefits with regards to but not limited to the risk of reaction to medication, bleeding, infection, perforation and the patient is agreeable to proceed. Written consent to be obtained. She will receive Phenergan 25 mg IV 30 minutes before the procedure to augment conscious sedation based amount of medication she received previously.

## 2013-10-29 NOTE — Progress Notes (Signed)
Primary Care Physician:  Syliva OvermanMargaret Simpson, MD  Primary Gastroenterologist:  Jonette EvaSandi Fields, MD   Chief Complaint  Patient presents with  . Colonoscopy  . EGD    HPI:  [name] is a 68 y.o. female here at the request of Dr. Lodema HongSimpson for further evaluation of gastroesophageal reflux disease into a colonoscopy. Her last colonoscopy was 10 years ago by Dr. Elpidio AnisLeroy Smith, she had numerous large scattered diverticulum but otherwise unremarkable. She has never had an upper endoscopy.   Feels like solid foods want go down. Tries to wash food down but liquids gets stuck behind the food. Eating a lot of TUMS lately. Prevacid recently D/C'd by PCP, unclear why. Lot of heartburn. No n/v. No abdominal pain. BM usually pretty regular. No melena, brbpr. Chronic GERD.  Current Outpatient Prescriptions  Medication Sig Dispense Refill  . amLODipine (NORVASC) 10 MG tablet Take 1 tablet (10 mg total) by mouth daily.  30 tablet  11  . aspirin (ASPIRIN LOW DOSE) 81 MG EC tablet Take 81 mg by mouth daily. Take one tablet by mouth once a day       . Calcium Carbonate-Vit D-Min (CALCIUM 600 + MINERALS) 600-200 MG-UNIT TABS Take by mouth 3 (three) times daily. Take one tablet by mouth three times a day       . clotrimazole-betamethasone (LOTRISONE) cream Apply topically 2 (two) times daily.  45 g  1  . fenofibrate (TRICOR) 145 MG tablet TAKE 1 TABLET BY MOUTH AT BEDTIME  30 tablet  4  . gabapentin (NEURONTIN) 100 MG capsule One capsule at bedtime for back and leg pain  30 capsule  3  . metoprolol succinate (TOPROL-XL) 50 MG 24 hr tablet TAKE 1 TABLET EVERY DAY  30 tablet  2  . niacin (NIASPAN) 500 MG CR tablet TAKE 2 TABLETS BY MOUTH AT BEDTIME  60 tablet  1  . risedronate (ACTONEL) 150 MG tablet Take 1 tablet (150 mg total) by mouth every 30 (thirty) days. with water on empty stomach, nothing by mouth or lie down for next 30 minutes.  1 tablet  6  . sertraline (ZOLOFT) 100 MG tablet TAKE 1 TABLET BY MOUTH EVERY  DAY  30 tablet  2  . sildenafil (REVATIO) 20 MG tablet Take 20 mg by mouth 3 (three) times daily.      . valsartan-hydrochlorothiazide (DIOVAN-HCT) 80-12.5 MG per tablet Take 1 tablet by mouth daily.  30 tablet  5   No current facility-administered medications for this visit.    Allergies as of 10/29/2013  . (No Known Allergies)    Past Medical History  Diagnosis Date  . Pulmonary hypertension   . Osteoarthrosis, unspecified whether generalized or localized, lower leg     bilateral knees   . Obesity   . GERD (gastroesophageal reflux disease)   . Hyperlipidemia   . Asthma   . Depression   . Diverticulosis of colon   . Hypertension   . Diabetes mellitus, type 2     normoglycemic off meds in 04/2012    Past Surgical History  Procedure Laterality Date  . Tubal ligation    . Colostomy    . 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 Romeo AppleHArrison  . Total knee arthroplasty  11/29/03    right / Dr. Romeo AppleHarrison  . Abdominal hysterectomy    . Colonoscopy  2005    Dr. Jerolyn ShinLeroy Smith:numerous large scattered diverticula    Family History  Problem Relation Age  of Onset  . Aneurysm Father   . Diabetes Father   . Diabetes Mother   . Hypertension Mother   . Heart disease Mother   . Diabetes Brother   . Hypertension Brother   . Hypertension Brother   . Hypertension Brother   . Diabetes Brother   . Diabetes Brother   . Hypertension Brother     History   Social History  . Marital Status: Married    Spouse Name: N/A    Number of Children: 2  . Years of Education: 12   Occupational History  . retired    .     Social History Main Topics  . Smoking status: Never Smoker   . Smokeless tobacco: Not on file  . Alcohol Use: No  . Drug Use: No  . Sexual Activity: No   Other Topics Concern  . Not on file   Social History Narrative  . No narrative on file      ROS:  General: Negative for anorexia, weight loss, fever, chills, fatigue, weakness. Eyes: Negative for  vision changes.  ENT: Negative for hoarseness, difficulty swallowing , nasal congestion. CV: Negative for chest pain, angina, palpitations, dyspnea on exertion, peripheral edema.  Respiratory: Negative for dyspnea at rest, dyspnea on exertion, cough, sputum, wheezing.  GI: See history of present illness. GU:  Negative for dysuria, hematuria, urinary incontinence, urinary frequency, nocturnal urination.  MS:+for joint pain. No low back pain.  Derm: Negative for rash or itching.  Neuro: Negative for weakness, abnormal sensation, seizure, frequent headaches, memory loss, confusion.  Psych: Negative for anxiety, depression, suicidal ideation, hallucinations.  Endo: Negative for unusual weight change.  Heme: Negative for bruising or bleeding. Allergy: Negative for rash or hives.    Physical Examination:  BP 152/71  Pulse 75  Temp(Src) 98.4 F (36.9 C) (Oral)  Ht 5' (1.524 m)  Wt 198 lb (89.812 kg)  BMI 38.67 kg/m2   General: Well-nourished, well-developed in no acute distress.  Head: Normocephalic, atraumatic.   Eyes: Conjunctiva pink, no icterus. Mouth: Oropharyngeal mucosa moist and pink , no lesions erythema or exudate. Neck: Supple without thyromegaly, masses, or lymphadenopathy.  Lungs: Clear to auscultation bilaterally.  Heart: Regular rate and rhythm, no murmurs rubs or gallops.  Abdomen: Bowel sounds are normal, nontender, nondistended, no hepatosplenomegaly or masses, no abdominal bruits or    hernia , no rebound or guarding.   Rectal: Not performed Extremities: No lower extremity edema. No clubbing or deformities.  Neuro: Alert and oriented x 4 , grossly normal neurologically.  Skin: Warm and dry, no rash or jaundice.   Psych: Alert and cooperative, normal mood and affect.  Labs: Lab Results  Component Value Date   HGBA1C 5.8* 09/15/2013   Lab Results  Component Value Date   TSH 2.171 09/15/2013   Lab Results  Component Value Date   WBC 9.1 09/15/2013   HGB 13.0  09/15/2013   HCT 39.7 09/15/2013   MCV 87.4 09/15/2013   PLT 220 09/15/2013   Lab Results  Component Value Date   CREATININE 0.71 09/15/2013   BUN 12 09/15/2013   NA 141 09/15/2013   K 3.7 09/15/2013   CL 105 09/15/2013   CO2 27 09/15/2013   Lab Results  Component Value Date   ALT 10 09/15/2013   AST 17 09/15/2013   ALKPHOS 64 09/15/2013   BILITOT 0.4 09/15/2013     Imaging Studies: No results found.

## 2013-10-29 NOTE — Assessment & Plan Note (Addendum)
Chronic GERD, solid food esophageal dysphagia. No prior EGD. Currently not on a PPI, reports this was discontinued recently for unclear reasons. She may have developed an esophageal web ring or stricture. Needs EGD to rule out complicated GERD.  I have discussed the risks, alternatives, benefits with regards to but not limited to the risk of reaction to medication, bleeding, infection, perforation and the patient is agreeable to proceed. Written consent to be obtained.  Phenergan 25 mg IV 30 minutes before the procedure to augment conscious sedation based on amount of medication required at time of last conscious sedation/colonoscopy.  Patient wants to wait on starting the PPI until after her procedure. Dr. Darrick Pennafields to make recommendations at time of procedure.

## 2013-11-01 ENCOUNTER — Other Ambulatory Visit: Payer: Self-pay | Admitting: Family Medicine

## 2013-11-01 NOTE — Progress Notes (Signed)
cc'd to pcp 

## 2013-11-02 ENCOUNTER — Encounter (HOSPITAL_COMMUNITY): Payer: Self-pay | Admitting: Pharmacy Technician

## 2013-11-13 ENCOUNTER — Other Ambulatory Visit: Payer: Self-pay | Admitting: Family Medicine

## 2013-11-15 ENCOUNTER — Ambulatory Visit (HOSPITAL_COMMUNITY)
Admission: RE | Admit: 2013-11-15 | Discharge: 2013-11-15 | Disposition: A | Discharge status: Home / Self Care | Payer: Medicare Other | Source: Ambulatory Visit | Attending: Gastroenterology | Admitting: Gastroenterology

## 2013-11-15 ENCOUNTER — Encounter (HOSPITAL_COMMUNITY)
Admission: RE | Disposition: A | Discharge status: Home / Self Care | Payer: Self-pay | Source: Ambulatory Visit | Attending: Gastroenterology

## 2013-11-15 ENCOUNTER — Encounter (HOSPITAL_COMMUNITY): Payer: Self-pay | Admitting: *Deleted

## 2013-11-15 DIAGNOSIS — F3289 Other specified depressive episodes: Secondary | ICD-10-CM | POA: Insufficient documentation

## 2013-11-15 DIAGNOSIS — R1310 Dysphagia, unspecified: Secondary | ICD-10-CM | POA: Insufficient documentation

## 2013-11-15 DIAGNOSIS — K3189 Other diseases of stomach and duodenum: Secondary | ICD-10-CM | POA: Insufficient documentation

## 2013-11-15 DIAGNOSIS — Z6838 Body mass index (BMI) 38.0-38.9, adult: Secondary | ICD-10-CM | POA: Insufficient documentation

## 2013-11-15 DIAGNOSIS — K219 Gastro-esophageal reflux disease without esophagitis: Secondary | ICD-10-CM

## 2013-11-15 DIAGNOSIS — K222 Esophageal obstruction: Secondary | ICD-10-CM | POA: Insufficient documentation

## 2013-11-15 DIAGNOSIS — K648 Other hemorrhoids: Secondary | ICD-10-CM | POA: Insufficient documentation

## 2013-11-15 DIAGNOSIS — K5730 Diverticulosis of large intestine without perforation or abscess without bleeding: Secondary | ICD-10-CM | POA: Insufficient documentation

## 2013-11-15 DIAGNOSIS — Z7982 Long term (current) use of aspirin: Secondary | ICD-10-CM | POA: Insufficient documentation

## 2013-11-15 DIAGNOSIS — F329 Major depressive disorder, single episode, unspecified: Secondary | ICD-10-CM | POA: Insufficient documentation

## 2013-11-15 DIAGNOSIS — R1013 Epigastric pain: Secondary | ICD-10-CM

## 2013-11-15 DIAGNOSIS — K449 Diaphragmatic hernia without obstruction or gangrene: Secondary | ICD-10-CM | POA: Insufficient documentation

## 2013-11-15 DIAGNOSIS — I10 Essential (primary) hypertension: Secondary | ICD-10-CM | POA: Insufficient documentation

## 2013-11-15 DIAGNOSIS — E669 Obesity, unspecified: Secondary | ICD-10-CM | POA: Insufficient documentation

## 2013-11-15 DIAGNOSIS — Z79899 Other long term (current) drug therapy: Secondary | ICD-10-CM | POA: Insufficient documentation

## 2013-11-15 DIAGNOSIS — R1319 Other dysphagia: Secondary | ICD-10-CM

## 2013-11-15 DIAGNOSIS — Z1211 Encounter for screening for malignant neoplasm of colon: Secondary | ICD-10-CM | POA: Insufficient documentation

## 2013-11-15 DIAGNOSIS — K2940 Chronic atrophic gastritis without bleeding: Secondary | ICD-10-CM | POA: Insufficient documentation

## 2013-11-15 DIAGNOSIS — Q438 Other specified congenital malformations of intestine: Secondary | ICD-10-CM | POA: Insufficient documentation

## 2013-11-15 HISTORY — PX: COLONOSCOPY: SHX5424

## 2013-11-15 HISTORY — PX: ESOPHAGOGASTRODUODENOSCOPY (EGD) WITH ESOPHAGEAL DILATION: SHX5812

## 2013-11-15 SURGERY — COLONOSCOPY
Anesthesia: Moderate Sedation

## 2013-11-15 MED ORDER — LIDOCAINE VISCOUS 2 % MT SOLN
OROMUCOSAL | Status: AC
  Filled 2013-11-15: qty 15

## 2013-11-15 MED ORDER — SODIUM CHLORIDE 0.9 % IJ SOLN
INTRAMUSCULAR | Status: AC
  Filled 2013-11-15: qty 10

## 2013-11-15 MED ORDER — MEPERIDINE HCL 100 MG/ML IJ SOLN
INTRAMUSCULAR | Status: AC
  Filled 2013-11-15: qty 2

## 2013-11-15 MED ORDER — SODIUM CHLORIDE 0.9 % IV SOLN
INTRAVENOUS | Status: DC
  Administered 2013-11-15: 12:00:00 via INTRAVENOUS

## 2013-11-15 MED ORDER — LANSOPRAZOLE 30 MG PO CPDR: DELAYED_RELEASE_CAPSULE | ORAL | Status: DC

## 2013-11-15 MED ORDER — MIDAZOLAM HCL 5 MG/5ML IJ SOLN
INTRAMUSCULAR | Status: AC
  Filled 2013-11-15: qty 10

## 2013-11-15 MED ORDER — PROMETHAZINE HCL 25 MG/ML IJ SOLN
INTRAMUSCULAR | Status: AC
  Filled 2013-11-15: qty 1

## 2013-11-15 MED ORDER — PROMETHAZINE HCL 25 MG/ML IJ SOLN
25.0000 mg | Freq: Once | INTRAMUSCULAR | Status: AC
  Administered 2013-11-15: 25 mg via INTRAVENOUS

## 2013-11-15 MED ORDER — MIDAZOLAM HCL 5 MG/5ML IJ SOLN
INTRAMUSCULAR | Status: DC | PRN
  Administered 2013-11-15: 2 mg via INTRAVENOUS
  Administered 2013-11-15: 1 mg via INTRAVENOUS
  Administered 2013-11-15: 2 mg via INTRAVENOUS
  Administered 2013-11-15: 1 mg via INTRAVENOUS
  Administered 2013-11-15: 2 mg via INTRAVENOUS
  Administered 2013-11-15 (×2): 1 mg via INTRAVENOUS

## 2013-11-15 MED ORDER — SIMETHICONE 40 MG/0.6ML PO SUSP
ORAL | Status: DC | PRN
  Administered 2013-11-15: 13:00:00

## 2013-11-15 MED ORDER — MEPERIDINE HCL 100 MG/ML IJ SOLN
INTRAMUSCULAR | Status: DC | PRN
  Administered 2013-11-15 (×5): 25 mg via INTRAVENOUS

## 2013-11-15 NOTE — Progress Notes (Signed)
REVIEWED.  

## 2013-11-15 NOTE — H&P (Signed)
Primary Care Physician:  Syliva OvermanMargaret Simpson, MD Primary Gastroenterologist:  Dr. Darrick PennaFields  Pre-Procedure History & Physical: HPI:  Mallory GrammesDiana M Wagner is a 68 y.o. female here for DYSPHAGIA/screening   Past Medical History  Diagnosis Date  . Pulmonary hypertension   . Osteoarthrosis, unspecified whether generalized or localized, lower leg     bilateral knees   . Obesity   . GERD (gastroesophageal reflux disease)   . Hyperlipidemia   . Asthma   . Depression   . Diverticulosis of colon   . Hypertension   . Back pain     Past Surgical History  Procedure Laterality Date  . Tubal ligation    . 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 Romeo AppleHArrison  . Total knee arthroplasty  11/29/03    right / Dr. Romeo AppleHarrison  . Abdominal hysterectomy    . Colonoscopy  2005    Dr. Jerolyn ShinLeroy Smith:numerous large scattered diverticula    Prior to Admission medications   Medication Sig Start Date End Date Taking? Authorizing Provider  amLODipine (NORVASC) 10 MG tablet Take 1 tablet (10 mg total) by mouth daily. 05/08/12 10/06/18 Yes Kerri PerchesMargaret E Simpson, MD  aspirin (ASPIRIN LOW DOSE) 81 MG EC tablet Take 81 mg by mouth daily. Take one tablet by mouth once a day    Yes Historical Provider, MD  Calcium Carbonate-Vit D-Min (CALCIUM 600 + MINERALS) 600-200 MG-UNIT TABS Take by mouth 3 (three) times daily. Take one tablet by mouth three times a day    Yes Historical Provider, MD  clotrimazole-betamethasone (LOTRISONE) cream Apply topically 2 (two) times daily. 12/22/12  Yes Kerri PerchesMargaret E Simpson, MD  fenofibrate (TRICOR) 145 MG tablet TAKE 1 TABLET BY MOUTH AT BEDTIME 03/05/12  Yes Kerri PerchesMargaret E Simpson, MD  gabapentin (NEURONTIN) 100 MG capsule One capsule at bedtime for back and leg pain 09/15/13  Yes Kerri PerchesMargaret E Simpson, MD  metoprolol succinate (TOPROL-XL) 50 MG 24 hr tablet TAKE 1 TABLET EVERY DAY   Yes Kerri PerchesMargaret E Simpson, MD  niacin (NIASPAN) 500 MG CR tablet TAKE 2 TABLETS BY MOUTH AT BEDTIME 11/01/13  Yes Kerri PerchesMargaret E  Simpson, MD  polyethylene glycol-electrolytes (TRILYTE) 420 G solution Take 4,000 mLs by mouth as directed. 10/29/13  Yes West BaliSandi L Mischelle Reeg, MD  risedronate (ACTONEL) 150 MG tablet Take 1 tablet (150 mg total) by mouth every 30 (thirty) days. with water on empty stomach, nothing by mouth or lie down for next 30 minutes. 08/17/13  Yes Kerri PerchesMargaret E Simpson, MD  sertraline (ZOLOFT) 100 MG tablet TAKE 1 TABLET BY MOUTH EVERY DAY 10/11/13  Yes Kerri PerchesMargaret E Simpson, MD  sildenafil (REVATIO) 20 MG tablet Take 60 mg by mouth daily.    Yes Historical Provider, MD  valsartan-hydrochlorothiazide (DIOVAN-HCT) 80-12.5 MG per tablet Take 1 tablet by mouth daily. 10/05/13  Yes Kerri PerchesMargaret E Simpson, MD    Allergies as of 10/29/2013  . (No Known Allergies)    Family History  Problem Relation Age of Onset  . Aneurysm Father   . Diabetes Father   . Diabetes Mother   . Hypertension Mother   . Heart disease Mother   . Diabetes Brother   . Hypertension Brother   . Hypertension Brother   . Hypertension Brother   . Diabetes Brother   . Diabetes Brother   . Hypertension Brother   . Colon cancer Maternal Uncle     History   Social History  . Marital Status: Married    Spouse Name: N/A  Number of Children: 2  . Years of Education: 12   Occupational History  . retired    .     Social History Main Topics  . Smoking status: Never Smoker   . Smokeless tobacco: Not on file  . Alcohol Use: No  . Drug Use: No  . Sexual Activity: No   Other Topics Concern  . Not on file   Social History Narrative  . No narrative on file    Review of Systems: See HPI, otherwise negative ROS   Physical Exam: BP 154/89  Pulse 79  Temp(Src) 97.7 F (36.5 C) (Oral)  Resp 23  Ht 5' (1.524 m)  Wt 198 lb (89.812 kg)  BMI 38.67 kg/m2  SpO2 99% General:   Alert,  pleasant and cooperative in NAD Head:  Normocephalic and atraumatic. Neck:  Supple; Lungs:  Clear throughout to auscultation.    Heart:  Regular rate and  rhythm. Abdomen:  Soft, nontender and nondistended. Normal bowel sounds, without guarding, and without rebound.   Neurologic:  Alert and  oriented x4;  grossly normal neurologically.  Impression/Plan:    DYSPHAGIA/screening  PLAN:  EGD/DIL/tcs TODAY

## 2013-11-15 NOTE — Discharge Instructions (Signed)
YOU HAVE DIVERTICULOSIS IN YOUR LEFT COLON. YOU HAVE GASTRITIS. 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 I BIOPSIED YOUR STOMACH. YOUR PROBLEMS SWALLOWING ARE MOST LIKELY DUE TO REFLUX, THE STRICTURE, AND THE LARGE HIATAL HERNIA.    CONTINUE PREVACID once daily 30 MINUTES PRIOR TO BREAKFAST.  CONTINUE YOUR WEIGHT LOSS EFFORTS.  FOLLOW A SOFT MECHANICAL/LOW FAT DIET. AVOID ITEMS THAT CAUSE BLOATING. SEE INFO BELOW.  MEATS SHOULD BE CHOPPED OR GROUND ONLY. TRY NOT TO EAT CHUNKS OF ANYTHING.  YOUR BIOPSY WILL BE BACK IN 7 DAYS.  Follow up in AUG 2015.  Next colonoscopy in 10 years.    ENDOSCOPY Care After Read the instructions outlined below and refer to this sheet in the next week. These discharge instructions provide you with general information on caring for yourself after you leave the hospital. While your treatment has been planned according to the most current medical practices available, unavoidable complications occasionally occur. If you have any problems or questions after discharge, call DR. Eliany Mccarter, [phone].  ACTIVITY  You may resume your regular activity, but move at a slower pace for the next 24 hours.   Take frequent rest periods for the next 24 hours.   Walking will help get rid of the air and reduce the bloated feeling in your belly (abdomen).   No driving for 24 hours (because of the medicine (anesthesia) used during the test).   You may shower.   Do not sign any important legal documents or operate any machinery for 24 hours (because of the anesthesia used during the test).    NUTRITION  Drink plenty of fluids.   You may resume your normal diet as instructed by your doctor.   Begin with a light meal and progress to your normal diet. Heavy or fried foods are harder to digest and may make you feel sick to your stomach (nauseated).   Avoid alcoholic beverages for 24 hours or as instructed.     MEDICATIONS  You may resume your normal medications.   WHAT YOU CAN EXPECT TODAY  Some feelings of bloating in the abdomen.   Passage of more gas than usual.   Spotting of blood in your stool or on the toilet paper  .  IF YOU HAD POLYPS REMOVED DURING THE ENDOSCOPY:  Eat a soft diet IF YOU HAVE NAUSEA, BLOATING, ABDOMINAL PAIN, OR VOMITING.    FINDING OUT THE RESULTS OF YOUR TEST Not all test results are available during your visit. DR. Darrick Penna WILL CALL YOU WITHIN 7 DAYS OF YOUR PROCEDUE WITH YOUR RESULTS. Do not assume everything is normal if you have not heard from DR. Eyonna Sandstrom IN ONE WEEK, CALL HER OFFICE AT [phone].  SEEK IMMEDIATE MEDICAL ATTENTION AND CALL THE OFFICE: [phone] IF:  You have more than a spotting of blood in your stool.   Your belly is swollen (abdominal distention).   You are nauseated or vomiting.   You have a temperature over 101F.   You have abdominal pain or discomfort that is severe or gets worse throughout the day.   Low-Fat Diet BREADS, CEREALS, PASTA, RICE, DRIED PEAS, AND BEANS These products are high in carbohydrates and most are low in fat. Therefore, they can be increased in the diet as substitutes for fatty foods. They too, however, contain calories and should not be eaten in excess. Cereals can be eaten for snacks as well as for breakfast.   FRUITS AND VEGETABLES It is good to  eat fruits and vegetables. Besides being sources of fiber, both are rich in vitamins and some minerals. They help you get the daily allowances of these nutrients. Fruits and vegetables can be used for snacks and desserts.  MEATS Limit lean meat, chicken, [place], and fish to no more than 6 ounces per day. Beef, Pork, and Lamb Use lean cuts of beef, pork, and lamb. Lean cuts include:  Extra-lean ground beef.  Arm roast.  Sirloin tip.  Center-cut ham.  Round steak.  Loin chops.  Rump roast.  Tenderloin.  Trim all fat off the outside of meats  before cooking. It is not necessary to severely decrease the intake of red meat, but lean choices should be made. Lean meat is rich in protein and contains a highly absorbable form of iron. Premenopausal women, in particular, should avoid reducing lean red meat because this could increase the risk for low red blood cells (iron-deficiency anemia).  Chicken and [place] These are good sources of protein. The fat of poultry can be reduced by removing the skin and underlying fat layers before cooking. Chicken and [place] can be substituted for lean red meat in the diet. Poultry should not be fried or covered with high-fat sauces. Fish and Shellfish Fish is a good source of protein. Shellfish contain cholesterol, but they usually are low in saturated fatty acids. The preparation of fish is important. Like chicken and [place], they should not be fried or covered with high-fat sauces. EGGS Egg whites contain no fat or cholesterol. They can be eaten often. Try 1 to 2 egg whites instead of whole eggs in recipes or use egg substitutes that do not contain yolk. MILK AND DAIRY PRODUCTS Use skim or 1% milk instead of 2% or whole milk. Decrease whole milk, natural, and processed cheeses. Use nonfat or low-fat (2%) cottage cheese or low-fat cheeses made from vegetable oils. Choose nonfat or low-fat (1 to 2%) yogurt. Experiment with evaporated skim milk in recipes that call for heavy cream. Substitute low-fat yogurt or low-fat cottage cheese for sour cream in dips and salad dressings. Have at least 2 servings of low-fat dairy products, such as 2 glasses of skim (or 1%) milk each day to help get your daily calcium intake. FATS AND OILS Reduce the total intake of fats, especially saturated fat. Butterfat, lard, and beef fats are high in saturated fat and cholesterol. These should be avoided as much as possible. Vegetable fats do not contain cholesterol, but certain vegetable fats, such as coconut oil, palm oil, and palm kernel  oil are very high in saturated fats. These should be limited. These fats are often used in bakery goods, processed foods, popcorn, oils, and nondairy creamers. Vegetable shortenings and some peanut butters contain hydrogenated oils, which are also saturated fats. Read the labels on these foods and check for saturated vegetable oils. Unsaturated vegetable oils and fats do not raise blood cholesterol. However, they should be limited because they are fats and are high in calories. Total fat should still be limited to 30% of your daily caloric intake. Desirable liquid vegetable oils are corn oil, cottonseed oil, olive oil, canola oil, safflower oil, soybean oil, and sunflower oil. Peanut oil is not as good, but small amounts are acceptable. Buy a heart-healthy tub margarine that has no partially hydrogenated oils in the ingredients. Mayonnaise and salad dressings often are made from unsaturated fats, but they should also be limited because of their high calorie and fat content. Seeds, nuts, peanut butter, olives, and  avocados are high in fat, but the fat is mainly the unsaturated type. These foods should be limited mainly to avoid excess calories and fat. OTHER EATING TIPS Snacks  Most sweets should be limited as snacks. They tend to be rich in calories and fats, and their caloric content outweighs their nutritional value. Some good choices in snacks are graham crackers, melba toast, soda crackers, bagels (no egg), English muffins, fruits, and vegetables. These snacks are preferable to snack crackers, [place] fries, TORTILLA CHIPS, and POTATO chips. Popcorn should be air-popped or cooked in small amounts of liquid vegetable oil. Desserts Eat fruit, low-fat yogurt, and fruit ices instead of pastries, cake, and cookies. Sherbet, angel food cake, gelatin dessert, frozen low-fat yogurt, or other frozen products that do not contain saturated fat (pure fruit juice bars, frozen ice pops) are also acceptable.  COOKING  METHODS Choose those methods that use little or no fat. They include: Poaching.  Braising.  Steaming.  Grilling.  Baking.  Stir-frying.  Broiling.  Microwaving.  Foods can be cooked in a nonstick pan without added fat, or use a nonfat cooking spray in regular cookware. Limit fried foods and avoid frying in saturated fat. Add moisture to lean meats by using water, broth, cooking wines, and other nonfat or low-fat sauces along with the cooking methods mentioned above. Soups and stews should be chilled after cooking. The fat that forms on top after a few hours in the refrigerator should be skimmed off. When preparing meals, avoid using excess salt. Salt can contribute to raising blood pressure in some people.  EATING AWAY FROM HOME Order entres, potatoes, and vegetables without sauces or butter. When meat exceeds the size of a deck of cards (3 to 4 ounces), the rest can be taken home for another meal. Choose vegetable or fruit salads and ask for low-calorie salad dressings to be served on the side. Use dressings sparingly. Limit high-fat toppings, such as bacon, crumbled eggs, cheese, sunflower seeds, and olives. Ask for heart-healthy tub margarine instead of butter.  High-Fiber Diet A high-fiber diet changes your normal diet to include more whole grains, legumes, fruits, and vegetables. Changes in the diet involve replacing refined carbohydrates with unrefined foods. The calorie level of the diet is essentially unchanged. The Dietary Reference Intake (recommended amount) for adult males is 38 grams per day. For adult females, it is 25 grams per day. Pregnant and lactating women should consume 28 grams of fiber per day. Fiber is the intact part of a plant that is not broken down during digestion. Functional fiber is fiber that has been isolated from the plant to provide a beneficial effect in the body. PURPOSE  Increase stool bulk.   Ease and regulate bowel movements.   Lower cholesterol.   INDICATIONS THAT YOU NEED MORE FIBER  Constipation and hemorrhoids.   Uncomplicated diverticulosis (intestine condition) and irritable bowel syndrome.   Weight management.   As a protective measure against hardening of the arteries (atherosclerosis), diabetes, and cancer.   GUIDELINES FOR INCREASING FIBER IN THE DIET  Start adding fiber to the diet slowly. A gradual increase of about 5 more grams (2 slices of whole-wheat bread, 2 servings of most fruits or vegetables, or 1 bowl of high-fiber cereal) per day is best. Too rapid an increase in fiber may result in constipation, flatulence, and bloating.   Drink enough water and fluids to keep your urine clear or pale yellow. Water, juice, or caffeine-free drinks are recommended. Not drinking enough fluid may  cause constipation.   Eat a variety of high-fiber foods rather than one type of fiber.   Try to increase your intake of fiber through using high-fiber foods rather than fiber pills or supplements that contain small amounts of fiber.   The goal is to change the types of food eaten. Do not supplement your present diet with high-fiber foods, but replace foods in your present diet.  INCLUDE A VARIETY OF FIBER SOURCES  Replace refined and processed grains with whole grains, canned fruits with fresh fruits, and incorporate other fiber sources. White rice, white breads, and most bakery goods contain little or no fiber.   Brown whole-grain rice, buckwheat oats, and many fruits and vegetables are all good sources of fiber. These include: broccoli, Brussels sprouts, cabbage, cauliflower, beets, sweet potatoes, white potatoes (skin on), carrots, tomatoes, eggplant, squash, berries, fresh fruits, and dried fruits.   Cereals appear to be the richest source of fiber. Cereal fiber is found in whole grains and bran. Bran is the fiber-rich outer coat of cereal grain, which is largely removed in refining. In whole-grain cereals, the bran remains. In  breakfast cereals, the largest amount of fiber is found in those with "bran" in their names. The fiber content is sometimes indicated on the label.   You may need to include additional fruits and vegetables each day.   In baking, for 1 cup white flour, you may use the following substitutions:   1 cup whole-wheat flour minus 2 tablespoons.   1/2 cup white flour plus 1/2 cup whole-wheat flour.   Hiatal Hernia A hiatal hernia occurs when a part of the stomach slides above the diaphragm. The diaphragm is the thin muscle separating the belly (abdomen) from the chest. A hiatal hernia can be something you are born with or develop over time. Hiatal hernias may allow stomach acid to flow back into your esophagus, the tube which carries food from your mouth to your stomach. If this acid causes problems it is called GERD (gastro-esophageal reflux disease).   SYMPTOMS Common symptoms of GERD are heartburn (burning in your chest). This is worse when lying down or bending over. It may also cause belching and indigestion. Some of the things which make GERD worse are:  Increased weight pushes on stomach making acid rise more easily.   Smoking markedly increases acid production.   Alcohol decreases lower esophageal sphincter pressure (valve between stomach and esophagus), allowing acid from stomach into esophagus.   Late evening meals and going to bed with a full stomach increases pressure.   Anything that causes an increase in acid production.   Lower esophageal sphincter incompetence.    HOME CARE INSTRUCTIONS  Try to achieve and maintain an ideal body weight.   Avoid drinking alcoholic beverages.   Stop smoking.   Put the head of your bed on 4 to 6 inch blocks. This will keep your head and esophagus higher than your stomach. If you cannot use blocks, sleep with several pillows under your head and shoulders.   MINIMIZE THE USE OF aspirin, ibuprofen (Advil or Motrin), or other nonsteroidal  anti-inflammatory drugs.   Do not wear tight clothing around your chest or stomach.   Eat smaller meals and eat more frequently. This keeps your stomach from getting too full. Eat slowly.   Do not lie down for 2 or 3 hours after eating. Do not eat or drink anything 1 to 2 hours before going to bed.   Avoid caffeine beverages (colas, coffee, cocoa,  tea), fatty foods, citrus fruits and all other foods and drinks that contain acid and that seem to increase the problems.   Avoid bending over, especially after eating. Also avoid straining during bowel movements or when urinating or lifting things. Anything that increases the pressure in your belly increases the amount of acid that may be pushed up into your esophagus.     REFLUX  SYMPTOMS Common symptoms of GERD are heartburn (burning in your chest). This is worse when lying down or bending over. It may also cause belching, or difficulty swallowing, and indigestion. Some of the things which make GERD worse are:  Increased weight pushes on stomach making acid rise more easily.   Smoking markedly increases acid production.   Alcohol decreases lower esophageal sphincter pressure (valve between stomach and esophagus), allowing acid from stomach into esophagus.   Late evening meals and going to bed with a full stomach increases pressure.   Anything that causes an increase in acid production.    HOME CARE INSTRUCTIONS  Try to achieve and maintain an ideal body weight.   Avoid drinking alcoholic beverages.   DO NOT smokE.   Do not wear tight clothing around your chest or stomach.   Eat smaller meals and eat more frequently. This keeps your stomach from getting too full. Eat slowly.   Do not lie down for 2 or 3 hours after eating. Do not eat or drink anything 1 to 2 hours before going to bed.   Avoid caffeine beverages (colas, coffee, cocoa, tea), fatty foods, citrus fruits and all other foods and drinks that contain acid and that seem  to increase the problems.   Avoid bending over, especially after eating OR STRAINING. Anything that increases the pressure in your belly increases the amount of acid that may be pushed up into your esophagus.   Gastritis  Gastritis is an inflammation (the body's way of reacting to injury and/or infection) of the stomach. It is often caused by viral or bacterial (germ) infections. It can also be caused BY ASPIRIN, BC/GOODY POWDER'S, (IBUPROFEN) MOTRIN, OR ALEVE (NAPROXEN), chemicals (including alcohol), SPICY FOODS, and medications. This illness may be associated with generalized malaise (feeling tired, not well), UPPER ABDOMINAL STOMACH cramps, and fever. One common bacterial cause of gastritis is an organism known as H. Pylori. This can be treated with antibiotics.

## 2013-11-16 DIAGNOSIS — R1013 Epigastric pain: Secondary | ICD-10-CM

## 2013-11-16 NOTE — Op Note (Signed)
[facility] [address] [place], [id number]   COLONOSCOPY PROCEDURE REPORT  PATIENT: [name], [name]  MR#: [id number] BIRTHDATE: June 22, 1946 , 67  yrs. old GENDER: Female ENDOSCOPIST: Jonette EvaSandi Fields, MD REFERRED AO:[id number]:Margaret Lodema HongSimpson, M.D. PROCEDURE DATE:  11/15/2013 PROCEDURE:   Colonoscopy, screening INDICATIONS:Average risk patient for colon cancer. MEDICATIONS: Promethazine (Phenergan) 12.5mg  IV, Demerol 100 mg IV, and Versed 8 mg IV  DESCRIPTION OF PROCEDURE:    Physical exam was performed.  Informed consent was obtained from the patient after explaining the benefits, risks, and alternatives to procedure.  The patient was connected to monitor and placed in left lateral position. Continuous oxygen was provided by nasal cannula and IV medicine administered through an indwelling cannula.  After administration of sedation and rectal exam, the patients rectum was intubated and the EC-3890Li ([id number]), EC-3490TLi ([id number]), and EG-2990i ([id number])  colonoscope was advanced under direct visualization to the cecum.  The scope was removed slowly by carefully examining the color, texture, anatomy, and integrity mucosa on the way out.  The patient was recovered in endoscopy and discharged home in satisfactory condition.    COLON FINDINGS: There was moderate diverticulosis noted in the descending colon and sigmoid colon with associated muscular hypertrophy and tortuosity.  , The colon mucosa was otherwise normal.  , Small internal hemorrhoids were found.  , and The colon IS redundant.  Manual abdominal counter-pressure was used to reach the cecum.  The patient was moved on to their back to reach the cecum.  PREP QUALITY: good. CECAL W/D TIME: 11 minutes     COMPLICATIONS: None  ENDOSCOPIC IMPRESSION: 1.   Moderate diverticulosis in the descending colon and sigmoid colon 2.   Small internal hemorrhoids 3.   The colon ISredundant   RECOMMENDATIONS: CONTINUE  PREVACID once daily 30 MINUTES PRIOR TO BREAKFAST. CONTINUE WEIGHT LOSS EFFORTS. FOLLOW A SOFT MECHANICAL/LOW FAT DIET.  AVOID ITEMS THAT CAUSE BLOATING.  MEATS SHOULD BE CHOPPED OR GROUND ONLY.  TRY NOT TO EAT CHUNKS OF ANYTHING. BIOPSY WILL BE BACK IN 7 DAYS. Follow up in AUG 2015.  Next colonoscopy in 10 years WITH AN OVERTUBE.     _______________________________ Rosalie DoctoreSignedJonette Eva:  Sandi Fields, MD 11/16/2013 1:55 PM

## 2013-11-16 NOTE — Op Note (Signed)
[facility] [address] [place], [id number]   ENDOSCOPY PROCEDURE REPORT  PATIENT: Mallory GrammesJohnson, [name]  MR#: [id number] BIRTHDATE: [birth year] , 67  yrs. old GENDER: Female  ENDOSCOPIST: Jonette EvaSandi Zian Delair, MD REFFERED XB:[id number]:Margaret Lodema HongSimpson, M.D. PROCEDURE DATE:  11/15/2013 PROCEDURE:   EGD with biopsy and EGD with dilatation over guidewire  INDICATIONS:1.  dyspepsia.   2.  dysphagia. MEDICATIONS: TCS + Demerol 25 mg IV and Versed 2 mg IV TOPICAL ANESTHETIC: Viscous Xylocaine  DESCRIPTION OF PROCEDURE:   After the risks benefits and alternatives of the procedure were thoroughly explained, informed consent was obtained.  The EG-2990i ([id number])  endoscope was introduced through the mouth and advanced to the second portion of the duodenum. The instrument was slowly withdrawn as the mucosa was carefully examined.  Prior to withdrawal of the scope, the guidwire was placed.  The esophagus was dilated successfully.  The patient was recovered in endoscopy and discharged home in satisfactory condition.   ESOPHAGUS: A stricture was found at the gastroesophageal junction. The stenosis was traversable with the endoscope.   A large hiatal hernia was noted.   STOMACH: Moderate erosive gastritis (inflammation) was found in the gastric antrum and on the greater curvature of the gastric body.  Multiple biopsies were performed using cold forceps.   DUODENUM: The duodenal mucosa showed no abnormalities in the bulb and second portion of the duodenum. Dilation was then performed at the gastroesphageal junction Dilator: Savary over guidewire Size(s): 14-16 MM Resistance: minimal Heme: yes  COMPLICATIONS: There were no complications.  ENDOSCOPIC IMPRESSION: 1.   Stricture at the gastroesophageal junction 2.   Large hiatal hernia 3.   MDOEARTE Erosive gastritis  RECOMMENDATIONS: CONTINUE PREVACID once daily 30 MINUTES PRIOR TO BREAKFAST. CONTINUE WEIGHT LOSS EFFORTS. FOLLOW A SOFT  MECHANICAL/LOW FAT DIET.  AVOID ITEMS THAT CAUSE BLOATING.  MEATS SHOULD BE CHOPPED OR GROUND ONLY.  TRY NOT TO EAT CHUNKS OF ANYTHING. BIOPSY WILL BE BACK IN 7 DAYS. Follow up in AUG 2015.  Next colonoscopy in 10 years WITH AN OVERTUBE.      _______________________________ Rosalie DoctoreSignedJonette Eva:  Niels Cranshaw, MD 11/16/2013 2:02 PM

## 2013-11-18 ENCOUNTER — Encounter (HOSPITAL_COMMUNITY): Payer: Self-pay | Admitting: Gastroenterology

## 2013-11-24 ENCOUNTER — Other Ambulatory Visit: Payer: Self-pay | Admitting: Family Medicine

## 2013-11-24 DIAGNOSIS — Z1231 Encounter for screening mammogram for malignant neoplasm of breast: Secondary | ICD-10-CM

## 2013-11-26 ENCOUNTER — Other Ambulatory Visit: Payer: Self-pay | Admitting: Family Medicine

## 2013-11-27 ENCOUNTER — Telehealth: Payer: Self-pay | Admitting: Gastroenterology

## 2013-11-27 NOTE — Telephone Encounter (Signed)
Please call pt. HER stomach Bx shows gastritis.    CONTINUE PREVACID once daily 30 MINUTES PRIOR TO BREAKFAST.  CONTINUE YOUR WEIGHT LOSS EFFORTS.  FOLLOW A SOFT MECHANICAL/LOW FAT DIET. AVOID ITEMS THAT CAUSE BLOATING. SEE INFO BELOW.  MEATS SHOULD BE CHOPPED OR GROUND ONLY. TRY NOT TO EAT CHUNKS OF ANYTHING.  Follow up in AUG 2015 E30 GERD/DYSPHAGIA.  Next colonoscopy in 10 years.

## 2013-11-29 ENCOUNTER — Ambulatory Visit (HOSPITAL_COMMUNITY)
Admission: RE | Admit: 2013-11-29 | Discharge: 2013-11-29 | Disposition: A | Discharge status: Home / Self Care | Payer: Medicare Other | Source: Ambulatory Visit | Attending: Family Medicine | Admitting: Family Medicine

## 2013-11-29 DIAGNOSIS — Z1231 Encounter for screening mammogram for malignant neoplasm of breast: Secondary | ICD-10-CM

## 2013-11-29 NOTE — Telephone Encounter (Signed)
Called and informed pt.  

## 2013-11-29 NOTE — Telephone Encounter (Signed)
Pt is aware of OV on 8/11 at 10 with AS and reminder in epic for a 10 yr tcs

## 2013-12-16 LAB — COMPREHENSIVE METABOLIC PANEL
ALT: 11 U/L (ref 0–35)
AST: 19 U/L (ref 0–37)
Albumin: 3.9 g/dL (ref 3.5–5.2)
Alkaline Phosphatase: 44 U/L (ref 39–117)
BILIRUBIN TOTAL: 0.6 mg/dL (ref 0.2–1.2)
BUN: 18 mg/dL (ref 6–23)
CO2: 27 meq/L (ref 19–32)
CREATININE: 1.03 mg/dL (ref 0.50–1.10)
Calcium: 9.4 mg/dL (ref 8.4–10.5)
Chloride: 105 mEq/L (ref 96–112)
Glucose, Bld: 108 mg/dL — ABNORMAL HIGH (ref 70–99)
Potassium: 3.9 mEq/L (ref 3.5–5.3)
Sodium: 140 mEq/L (ref 135–145)
Total Protein: 6.6 g/dL (ref 6.0–8.3)

## 2013-12-16 LAB — LIPID PANEL
CHOL/HDL RATIO: 2.3 ratio
Cholesterol: 183 mg/dL (ref 0–200)
HDL: 80 mg/dL (ref >39)
LDL Cholesterol: 95 mg/dL (ref 0–99)
TRIGLYCERIDES: 40 mg/dL (ref <150)
VLDL: 8 mg/dL (ref 0–40)

## 2013-12-16 LAB — HEMOGLOBIN A1C
HEMOGLOBIN A1C: 6 % — AB (ref <5.7)
Mean Plasma Glucose: 126 mg/dL — ABNORMAL HIGH (ref <117)

## 2013-12-24 ENCOUNTER — Other Ambulatory Visit: Payer: Self-pay | Admitting: Family Medicine

## 2013-12-27 ENCOUNTER — Ambulatory Visit (INDEPENDENT_AMBULATORY_CARE_PROVIDER_SITE_OTHER): Payer: Medicare Other | Admitting: Family Medicine

## 2013-12-27 ENCOUNTER — Encounter: Payer: Self-pay | Admitting: Family Medicine

## 2013-12-27 ENCOUNTER — Other Ambulatory Visit: Payer: Self-pay

## 2013-12-27 ENCOUNTER — Encounter (INDEPENDENT_AMBULATORY_CARE_PROVIDER_SITE_OTHER): Payer: Self-pay

## 2013-12-27 VITALS — BP 118/78 | HR 62 | Resp 16 | Wt 188.1 lb

## 2013-12-27 DIAGNOSIS — E669 Obesity, unspecified: Secondary | ICD-10-CM

## 2013-12-27 DIAGNOSIS — F3289 Other specified depressive episodes: Secondary | ICD-10-CM

## 2013-12-27 DIAGNOSIS — M5410 Radiculopathy, site unspecified: Secondary | ICD-10-CM

## 2013-12-27 DIAGNOSIS — I10 Essential (primary) hypertension: Secondary | ICD-10-CM

## 2013-12-27 DIAGNOSIS — I2789 Other specified pulmonary heart diseases: Secondary | ICD-10-CM

## 2013-12-27 DIAGNOSIS — IMO0002 Reserved for concepts with insufficient information to code with codable children: Secondary | ICD-10-CM

## 2013-12-27 DIAGNOSIS — E785 Hyperlipidemia, unspecified: Secondary | ICD-10-CM

## 2013-12-27 DIAGNOSIS — F329 Major depressive disorder, single episode, unspecified: Secondary | ICD-10-CM

## 2013-12-27 DIAGNOSIS — R7302 Impaired glucose tolerance (oral): Secondary | ICD-10-CM

## 2013-12-27 DIAGNOSIS — R7309 Other abnormal glucose: Secondary | ICD-10-CM

## 2013-12-27 MED ORDER — SERTRALINE HCL 100 MG PO TABS: ORAL_TABLET | ORAL | Status: DC

## 2013-12-27 MED ORDER — GABAPENTIN 100 MG PO CAPS: ORAL_CAPSULE | ORAL | Status: DC

## 2013-12-27 MED ORDER — RISEDRONATE SODIUM 150 MG PO TABS: 150.0000 mg | ORAL_TABLET | ORAL | Status: DC

## 2013-12-27 NOTE — Patient Instructions (Addendum)
Annual physical exam in October, call if you need me before  Labs and bP are excellent, also congrats on weight loss, keep it up  PLS work on stopping sodas, blood sugar has increased slightly  hBa1C , chem 7 and eGFr in october

## 2014-01-02 NOTE — Assessment & Plan Note (Signed)
Controlled, no change in medication  

## 2014-01-02 NOTE — Assessment & Plan Note (Signed)
Uncontrolled, med adjustment made, Hyperlipidemia:Low fat diet discussed and encouraged.    Updated lab needed  In 3 month

## 2014-01-02 NOTE — Assessment & Plan Note (Signed)
Uncontrolled, new med started for BP DASH diet and commitment to daily physical activity for a minimum of 30 minutes discussed and encouraged, as a part of hypertension management. The importance of attaining a healthy weight is also discussed.

## 2014-01-02 NOTE — Assessment & Plan Note (Signed)
Uncontrolled, dose increase in medication and f/u in 3.5 weeks DASH diet and commitment to daily physical activity for a minimum of 30 minutes discussed and encouraged, as a part of hypertension management. The importance of attaining a healthy weight is also discussed.

## 2014-01-02 NOTE — Progress Notes (Signed)
   Subjective:    Patient ID: [name], female    DOB: 10/12/1945, 68 y.o.   MRN: [id number]  HPI    Review of Systems     Objective:   Physical Exam        Assessment & Plan:  Routine general medical examination at a health care facility Annual exam as documented. Counseling done  re healthy lifestyle involving commitment to 150 minutes exercise per week, heart healthy diet, and attaining healthy weight.The importance of adequate sleep also discussed. Regular seat belt use and safe storage  of firearms if patient has them, is also discussed. Changes in health habits are decided on by the patient with goals and time frames  set for achieving them. Immunization and cancer screening needs are specifically addressed at this visit.   GERD Reports uncontrolled symptoms of reflux with difficulty swallowing, needs GI re eval  HYPERTENSION Uncontrolled, dose increase in medication and f/u in 3.5 weeks DASH diet and commitment to daily physical activity for a minimum of 30 minutes discussed and encouraged, as a part of hypertension management. The importance of attaining a healthy weight is also discussed.   HYPERLIPIDEMIA Uncontrolled, med adjustment made, Hyperlipidemia:Low fat diet discussed and encouraged.    Updated lab needed  In 3 month   Back pain with left-sided radiculopathy Uncontrolled pain at night , add gabapentin

## 2014-01-02 NOTE — Assessment & Plan Note (Signed)
Improved. Pt applauded on succesful weight loss through lifestyle change, and encouraged to continue same. Weight loss goal set for the next several months.  

## 2014-01-02 NOTE — Assessment & Plan Note (Signed)
Uncontrolled pain at night , add gabapentin

## 2014-01-02 NOTE — Addendum Note (Signed)
Addended by: Kerri PerchesSIMPSON, Sathvika Ojo E on: 01/02/2014 09:42 PM   Modules accepted: Level of Service

## 2014-01-02 NOTE — Assessment & Plan Note (Signed)
Hyperlipidemia:Low fat diet discussed and encouraged.  Updated lab needed at/ before next visit.  

## 2014-01-02 NOTE — Assessment & Plan Note (Signed)
Patient educated about the importance of limiting  Carbohydrate intake , the need to commit to daily physical activity for a minimum of 30 minutes , and to commit weight loss. The fact that changes in all these areas will reduce or eliminate all together the development of diabetes is stressed.   Updated lab needed at/ before next visit.  

## 2014-01-12 ENCOUNTER — Encounter: Payer: Self-pay | Admitting: Gastroenterology

## 2014-01-12 NOTE — Assessment & Plan Note (Signed)
Improved. Pt applauded on succesful weight loss through lifestyle change, and encouraged to continue same. Weight loss goal set for the next several months.  

## 2014-01-12 NOTE — Assessment & Plan Note (Signed)
Deteriorated with increase in HBa1C Patient educated about the importance of limiting  Carbohydrate intake , the need to commit to daily physical activity for a minimum of 30 minutes , and to commit weight loss. The fact that changes in all these areas will reduce or eliminate all together the development of diabetes is stressed.

## 2014-01-12 NOTE — Assessment & Plan Note (Signed)
Controlled, no change in medication  

## 2014-01-12 NOTE — Assessment & Plan Note (Signed)
Controlled, no change in medication DASH diet and commitment to daily physical activity for a minimum of 30 minutes discussed and encouraged, as a part of hypertension management. The importance of attaining a healthy weight is also discussed.  

## 2014-01-12 NOTE — Progress Notes (Signed)
   Subjective:    Patient ID: [name], female    DOB: 09/12/1945, 68 y.o.   MRN: [id number]  HPI The PT is here for follow up and re-evaluation of chronic medical conditions, medication management and review of any available recent lab and radiology data.  Preventive health is updated, specifically  Cancer screening and Immunization.   Questions or concerns regarding consultations or procedures which the PT has had in the interim are  addressed. The PT denies any adverse reactions to current medications since the last visit.  There are no new concerns.  There are no specific complaints       Review of Systems See HPI Denies recent fever or chills. Denies sinus pressure, nasal congestion, ear pain or sore throat. Denies chest congestion, productive cough or wheezing. Denies chest pains, palpitations and leg swelling Denies abdominal pain, nausea, vomiting,diarrhea or constipation.   Denies dysuria, frequency, hesitancy or incontinence. Denies uncontrolled  joint pain, swelling and limitation in mobility. Denies headaches, seizures, numbness, or tingling. Denies uncontrolled  depression, anxiety or insomnia. Denies skin break down or rash.        Objective:   Physical Exam BP 118/78  Pulse 62  Resp 16  Wt 188 lb 1.9 oz (85.331 kg)  SpO2 97% Patient alert and oriented and in no cardiopulmonary distress.  HEENT: No facial asymmetry, EOMI,   oropharynx pink and moist.  Neck supple no JVD, no mass.  Chest: Clear to auscultation bilaterally.  CVS: S1, S2 no murmurs, no S3.  ABD: Soft non tender.   Ext: No edema  MS: Adequate though reduced  ROM spine, shoulders, hips and knees.  Skin: Intact, no ulcerations or rash noted.  Psych: Good eye contact, normal affect. Memory intact not anxious or depressed appearing.  CNS: CN 2-12 intact, power,  normal throughout.no focal deficits noted.        Assessment & Plan:  HYPERTENSION Controlled, no change in  medication DASH diet and commitment to daily physical activity for a minimum of 30 minutes discussed and encouraged, as a part of hypertension management. The importance of attaining a healthy weight is also discussed.   IGT (impaired glucose tolerance) Deteriorated with increase in HBa1C Patient educated about the importance of limiting  Carbohydrate intake , the need to commit to daily physical activity for a minimum of 30 minutes , and to commit weight loss. The fact that changes in all these areas will reduce or eliminate all together the development of diabetes is stressed.     HYPERLIPIDEMIA Controlled, no change in medication Hyperlipidemia:Low fat diet discussed and encouraged.     OBESITY Improved. Pt applauded on succesful weight loss through lifestyle change, and encouraged to continue same. Weight loss goal set for the next several months.   DEPRESSION Controlled, no change in medication   Back pain with left-sided radiculopathy gabapentin for nerve pain  GERD Controlled, no change in medication   HYPERTENSION, PULMONARY Controlled and treated by pulmonary

## 2014-01-12 NOTE — Assessment & Plan Note (Signed)
Controlled, no change in medication Hyperlipidemia:Low fat diet discussed and encouraged.  \ 

## 2014-01-12 NOTE — Assessment & Plan Note (Signed)
Controlled and treated by pulmonary

## 2014-01-12 NOTE — Assessment & Plan Note (Signed)
gabapentin for nerve pain

## 2014-02-21 ENCOUNTER — Ambulatory Visit (INDEPENDENT_AMBULATORY_CARE_PROVIDER_SITE_OTHER): Payer: Medicare Other | Admitting: Gastroenterology

## 2014-02-21 ENCOUNTER — Encounter: Payer: Self-pay | Admitting: Gastroenterology

## 2014-02-21 ENCOUNTER — Encounter (INDEPENDENT_AMBULATORY_CARE_PROVIDER_SITE_OTHER): Payer: Self-pay

## 2014-02-21 VITALS — BP 128/64 | HR 77 | Temp 98.0°F | Resp 18 | Ht 60.0 in | Wt 195.0 lb

## 2014-02-21 DIAGNOSIS — R1314 Dysphagia, pharyngoesophageal phase: Secondary | ICD-10-CM

## 2014-02-21 DIAGNOSIS — R1319 Other dysphagia: Secondary | ICD-10-CM

## 2014-02-21 DIAGNOSIS — K219 Gastro-esophageal reflux disease without esophagitis: Secondary | ICD-10-CM

## 2014-02-21 DIAGNOSIS — R1310 Dysphagia, unspecified: Secondary | ICD-10-CM

## 2014-02-21 MED ORDER — OMEPRAZOLE 20 MG PO CPDR: 20.0000 mg | DELAYED_RELEASE_CAPSULE | Freq: Every day | ORAL | Status: DC

## 2014-02-21 NOTE — Assessment & Plan Note (Signed)
Controlled with PPI. Prevacid too expensive. Will trial Prilosec. Patient to call if issues with cost. 6 month return.

## 2014-02-21 NOTE — Assessment & Plan Note (Signed)
Improved with dilation. Secondary to stricture at GE junction. Notes some difficulty if taking large amounts of pills together but no further solid food dysphagia. I have asked her to contact me if any persistent symptoms, and we would pursue a BPE. Return in 6 months.

## 2014-02-21 NOTE — Progress Notes (Signed)
Referring Provider: Kerri Wagner, Mallory E, MD Primary Care Physician:  Mallory OvermanMargaret Simpson, MD Primary GI: Dr. Darrick Wagner   Chief Complaint  Patient presents with  . Follow-up    HPI:   [name] presents today in follow-up after TCS/EGD. Hx of GERD. Seen in April 2015 with dysphagia. 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 Negative H.pylori on path. Colonoscopy with diverticula. Due for next screening in 2025 with overtube.  Sometimes if swallowing too many pills at once will have dysphagia, otherwise doing well. Prevacid once daily. Expensive. Wants a different PPI. No constipation or diarrhea.     Past Medical History  Diagnosis Date  . Pulmonary hypertension   . Osteoarthrosis, unspecified whether generalized or localized, lower leg     bilateral knees   . Obesity   . GERD (gastroesophageal reflux disease)   . Hyperlipidemia   . Asthma   . Depression   . Diverticulosis of colon   . Hypertension   . Back pain     Past Surgical History  Procedure Laterality Date  . Tubal ligation    . 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 Romeo Wagner  . Total knee arthroplasty  11/29/03    right / Dr. Romeo Wagner  . Abdominal hysterectomy    . Colonoscopy  2005    Dr. Jerolyn ShinLeroy Wagner:numerous large scattered diverticula  . Colonoscopy N/A 11/15/2013    Dr. Darrick Wagner: moderate diverticula, small internal hemorrhoids, redudant colon. Next TCS in 2025 with overtube.   . Esophagogastroduodenoscopy (egd) with esophageal dilation N/A 11/15/2013    Dr. Darrick Wagner: stricture at GE junction s/p dilation. moderate erosive gastritis, negative H.pylori    Current Outpatient Prescriptions  Medication Sig Dispense Refill  . amLODipine (NORVASC) 10 MG tablet TAKE 1 TABLET EVERY DAY  30 tablet  2  . aspirin (ASPIRIN LOW DOSE) 81 MG EC tablet Take 81 mg by mouth daily. Take one tablet by mouth once a day       . Calcium Carbonate-Vit D-Min (CALCIUM 600 + MINERALS) 600-200 MG-UNIT  TABS Take by mouth 3 (three) times daily. Take one tablet by mouth three times a day       . clotrimazole-betamethasone (LOTRISONE) cream Apply topically 2 (two) times daily.  45 g  1  . fenofibrate (TRICOR) 145 MG tablet TAKE 1 TABLET BY MOUTH AT BEDTIME  30 tablet  2  . gabapentin (NEURONTIN) 100 MG capsule One capsule at bedtime for back and leg pain  30 capsule  4  . lansoprazole (PREVACID) 30 MG capsule 1 PO 30 MINS PRIOR TO YOUR FIRST MEAL  30 capsule  11  . metoprolol succinate (TOPROL-XL) 50 MG 24 hr tablet TAKE 1 TABLET EVERY DAY  30 tablet  2  . Multiple Vitamin (MULTIVITAMIN) tablet Take 1 tablet by mouth daily.      . niacin (NIASPAN) 500 MG CR tablet TAKE 2 TABLETS BY MOUTH AT BEDTIME  60 tablet  3  . pyridOXINE (VITAMIN B-6) 100 MG tablet Take 200 mg by mouth daily.      . risedronate (ACTONEL) 150 MG tablet Take 1 tablet (150 mg total) by mouth every 30 (thirty) days. with water on empty stomach, nothing by mouth or lie down for next 30 minutes.  1 tablet  6  . sertraline (ZOLOFT) 100 MG tablet TAKE 1 TABLET BY MOUTH EVERY DAY  30 tablet  4  . sildenafil (REVATIO) 20 MG tablet Take 60  mg by mouth daily.       . valsartan-hydrochlorothiazide (DIOVAN-HCT) 80-12.5 MG per tablet Take 1 tablet by mouth daily.       No current facility-administered medications for this visit.    Allergies as of 02/21/2014  . (No Known Allergies)    Family History  Problem Relation Age of Onset  . Aneurysm Father   . Diabetes Father   . Diabetes Mother   . Hypertension Mother   . Heart disease Mother   . Diabetes Brother   . Hypertension Brother   . Hypertension Brother   . Hypertension Brother   . Diabetes Brother   . Diabetes Brother   . Hypertension Brother   . Colon cancer Maternal Uncle     History   Social History  . Marital Status: Married    Spouse Name: N/A    Number of Children: 2  . Years of Education: 12   Occupational History  . retired    .     Social History  Main Topics  . Smoking status: Never Smoker   . Smokeless tobacco: None  . Alcohol Use: No  . Drug Use: No  . Sexual Activity: No   Other Topics Concern  . None   Social History Narrative  . None    Review of Systems: As mentioned in HPI.   Physical Exam: BP 128/64  Pulse 77  Temp(Src) 98 F (36.7 C) (Oral)  Resp 18  Ht 5' (1.524 m)  Wt 195 lb (88.451 kg)  BMI 38.08 kg/m2 General:   Alert and oriented. No distress noted. Pleasant and cooperative.  Heart:  S1, S2 present without murmurs, rubs, or gallops. Regular rate and rhythm. Abdomen:  +BS, soft, non-tender and non-distended. No rebound or guarding. No HSM or masses noted. Msk:  Symmetrical without gross deformities. Normal posture. Extremities:  Without edema. Neurologic:  Alert and  oriented x4;  grossly normal neurologically. Skin:  Intact without significant lesions or rashes. Psych:  Alert and cooperative. Normal mood and affect.

## 2014-02-21 NOTE — Patient Instructions (Signed)
Stop Prevacid due to cost. Start Omeprazole (Prilosec) once each morning, 30 minutes before breakfast. I have sent this to the pharmacy. Please let me know if this is too expensive.   We will see you back in 6 months.

## 2014-02-22 NOTE — Progress Notes (Signed)
Cc to PCP 

## 2014-03-01 ENCOUNTER — Ambulatory Visit: Payer: Medicare Other | Admitting: Gastroenterology

## 2014-03-26 ENCOUNTER — Other Ambulatory Visit: Payer: Self-pay | Admitting: Family Medicine

## 2014-04-18 ENCOUNTER — Telehealth: Payer: Self-pay | Admitting: Family Medicine

## 2014-04-19 NOTE — Telephone Encounter (Signed)
Patient on actonel 150 once monthly. Wants something cheaper and also Niacin is too expensive. Wants alternative [facility].

## 2014-04-20 ENCOUNTER — Other Ambulatory Visit: Payer: Self-pay

## 2014-04-20 MED ORDER — VALSARTAN-HYDROCHLOROTHIAZIDE 80-12.5 MG PO TABS: 1.0000 | ORAL_TABLET | Freq: Every day | ORAL | Status: DC

## 2014-04-25 ENCOUNTER — Other Ambulatory Visit: Payer: Self-pay

## 2014-04-25 MED ORDER — FENOFIBRATE 145 MG PO TABS: ORAL_TABLET | ORAL | Status: DC

## 2014-04-29 ENCOUNTER — Other Ambulatory Visit: Payer: Self-pay | Admitting: Family Medicine

## 2014-04-30 LAB — COMPLETE METABOLIC PANEL WITH GFR
ALK PHOS: 49 U/L (ref 39–117)
ALT: 11 U/L (ref 0–35)
AST: 20 U/L (ref 0–37)
Albumin: 3.9 g/dL (ref 3.5–5.2)
BILIRUBIN TOTAL: 0.6 mg/dL (ref 0.2–1.2)
BUN: 18 mg/dL (ref 6–23)
CO2: 27 mEq/L (ref 19–32)
CREATININE: 1.02 mg/dL (ref 0.50–1.10)
Calcium: 9.5 mg/dL (ref 8.4–10.5)
Chloride: 106 mEq/L (ref 96–112)
GFR, EST AFRICAN AMERICAN: 66 mL/min
GFR, Est Non African American: 57 mL/min — ABNORMAL LOW
Glucose, Bld: 92 mg/dL (ref 70–99)
Potassium: 4 mEq/L (ref 3.5–5.3)
SODIUM: 141 meq/L (ref 135–145)
TOTAL PROTEIN: 6.9 g/dL (ref 6.0–8.3)

## 2014-04-30 LAB — HEMOGLOBIN A1C
Hgb A1c MFr Bld: 6 % — ABNORMAL HIGH (ref <5.7)
MEAN PLASMA GLUCOSE: 126 mg/dL — AB (ref <117)

## 2014-05-05 ENCOUNTER — Encounter: Payer: Self-pay | Admitting: Family Medicine

## 2014-05-05 ENCOUNTER — Encounter (INDEPENDENT_AMBULATORY_CARE_PROVIDER_SITE_OTHER): Payer: Self-pay

## 2014-05-05 ENCOUNTER — Ambulatory Visit (INDEPENDENT_AMBULATORY_CARE_PROVIDER_SITE_OTHER): Payer: Medicare Other | Admitting: Family Medicine

## 2014-05-05 ENCOUNTER — Other Ambulatory Visit (HOSPITAL_COMMUNITY)
Admission: RE | Admit: 2014-05-05 | Discharge: 2014-05-05 | Disposition: A | Discharge status: Home / Self Care | Payer: Medicare Other | Source: Ambulatory Visit | Attending: Family Medicine | Admitting: Family Medicine

## 2014-05-05 VITALS — BP 128/76 | HR 73 | Resp 16 | Ht 60.0 in | Wt 203.0 lb

## 2014-05-05 DIAGNOSIS — N760 Acute vaginitis: Secondary | ICD-10-CM | POA: Diagnosis present

## 2014-05-05 DIAGNOSIS — K219 Gastro-esophageal reflux disease without esophagitis: Secondary | ICD-10-CM

## 2014-05-05 DIAGNOSIS — Z124 Encounter for screening for malignant neoplasm of cervix: Secondary | ICD-10-CM | POA: Insufficient documentation

## 2014-05-05 DIAGNOSIS — Z1211 Encounter for screening for malignant neoplasm of colon: Secondary | ICD-10-CM

## 2014-05-05 DIAGNOSIS — Z0000 Encounter for general adult medical examination without abnormal findings: Secondary | ICD-10-CM

## 2014-05-05 DIAGNOSIS — I10 Essential (primary) hypertension: Secondary | ICD-10-CM

## 2014-05-05 LAB — POC HEMOCCULT BLD/STL (OFFICE/1-CARD/DIAGNOSTIC): Fecal Occult Blood, POC: NEGATIVE

## 2014-05-05 LAB — LIPID PANEL
Cholesterol: 182 mg/dL (ref 0–200)
HDL: 86 mg/dL (ref >39)
LDL Cholesterol: 86 mg/dL (ref 0–99)
Total CHOL/HDL Ratio: 2.1 Ratio
Triglycerides: 48 mg/dL (ref <150)
VLDL: 10 mg/dL (ref 0–40)

## 2014-05-05 MED ORDER — LOSARTAN POTASSIUM-HCTZ 50-12.5 MG PO TABS: 1.0000 | ORAL_TABLET | Freq: Every day | ORAL | Status: DC

## 2014-05-05 MED ORDER — ALENDRONATE SODIUM 70 MG PO TABS: 70.0000 mg | ORAL_TABLET | ORAL | Status: DC

## 2014-05-05 MED ORDER — ATORVASTATIN CALCIUM 20 MG PO TABS: 20.0000 mg | ORAL_TABLET | Freq: Every day | ORAL | Status: DC

## 2014-05-05 NOTE — Patient Instructions (Addendum)
F/u in  6 weeks for nurse BP check and prevnar , call if you need me before  MD f/u in 3.5 months  Stop actonel, fenofibrate, niaspan and diovan/HCTZ  Fosamax , lipitor and hyzaar are prescribed in place , call if a problem  You will be called if tests are abnormal

## 2014-05-06 DIAGNOSIS — Z0000 Encounter for general adult medical examination without abnormal findings: Secondary | ICD-10-CM | POA: Insufficient documentation

## 2014-05-06 DIAGNOSIS — N760 Acute vaginitis: Secondary | ICD-10-CM | POA: Insufficient documentation

## 2014-05-06 LAB — CERVICOVAGINAL ANCILLARY ONLY
WET PREP (BD AFFIRM): NEGATIVE
WET PREP (BD AFFIRM): NEGATIVE
WET PREP (BD AFFIRM): POSITIVE — AB

## 2014-05-06 LAB — CYTOLOGY - PAP

## 2014-05-06 NOTE — Assessment & Plan Note (Signed)
Currently controlled , but need to change med due to cost , will need nurse f/u in 6 weeks.

## 2014-05-06 NOTE — Assessment & Plan Note (Signed)
Specimens sent for testing for infection due to pt c/o bad odor

## 2014-05-06 NOTE — Progress Notes (Signed)
   Subjective:    Patient ID: [name], female    DOB: Nov 09, 1945, 68 y.o.   MRN: [id number]  HPI Patient is in for annual physical exam. She has concerns about medication cost, and this is addressed . C/o bad odor in vaginal area, so specimens are sent for testing. Flu vaccine already received   Review of Systems See HPI     Objective:   Physical Exam  BP 128/76  Pulse 73  Resp 16  Ht 5' (1.524 m)  Wt 203 lb (92.08 kg)  BMI 39.65 kg/m2  SpO2 96%  Pleasant well nourished female, alert and oriented x 3, in no cardio-pulmonary distress. Afebrile. HEENT No facial trauma or asymetry. Sinuses non tender.  EOMI, PERTL,   External ears normal, tympanic membranes clear. Oropharynx moist, no exudate, good dentition. Neck: supple, no adenopathy,JVD or thyromegaly.No bruits.  Chest: Clear to ascultation bilaterally.No crackles or wheezes. Non tender to palpation  Breast: No asymetry,no masses or lumps. No tenderness. No nipple discharge or inversion. No axillary or supraclavicular adenopathy  Cardiovascular system; Heart sounds normal,  S1 and  S2 ,no S3.  No murmur, or thrill. Apical beat not displaced Peripheral pulses normal.  Abdomen: Soft, non tender, no organomegaly or masses. No bruits. Bowel sounds normal. No guarding, tenderness or rebound.  Rectal:  Normal sphincter tone. No mass.No rectal masses.  Guaiac negative stool.  GU: External genitalia normal female genitalia , female distribution of hair. No lesions. Urethral meatus normal in size, no  Prolapse, no lesions visibly  Present. Bladder non tender. Vagina pink bleeds easily on contact due to atrophy , with no visible lesions ,scant clear  discharge present .No abnormal odor on exam Adequate pelvic support no  cystocele or rectocele noted  Uterus absent no adnexal masses, no  adnexal tenderness.   Musculoskeletal exam: Full ROM of spine, hips , shoulders and knees. No deformity ,swelling  or crepitus noted. No muscle wasting or atrophy.   Neurologic: Cranial nerves 2 to 12 intact. Power, tone ,sensation and reflexes normal throughout. No disturbance in gait. No tremor.  Skin: Intact, no ulceration, erythema , scaling or rash noted. Pigmentation normal throughout  Psych; Normal mood and affect. Judgement and concentration normal       Assessment & Plan:  Annual physical exam Annual exam as documented. Counseling done  re healthy lifestyle involving commitment to 150 minutes exercise per week, heart healthy diet, and attaining healthy weight.The importance of adequate sleep also discussed. Regular seat belt use and home safety , is also discussed. Changes in health habits are decided on by the patient with goals and time frames  set for achieving them. Immunization and cancer screening needs are specifically addressed at this visit.   Vaginitis and vulvovaginitis Specimens sent for testing for infection due to pt c/o bad odor  Essential hypertension Currently controlled , but need to change med due to cost , will need nurse f/u in 6 weeks.

## 2014-05-06 NOTE — Assessment & Plan Note (Signed)

## 2014-06-02 ENCOUNTER — Other Ambulatory Visit: Payer: Self-pay | Admitting: Family Medicine

## 2014-06-02 ENCOUNTER — Other Ambulatory Visit: Payer: Self-pay

## 2014-06-02 MED ORDER — OMEPRAZOLE 20 MG PO CPDR: 20.0000 mg | DELAYED_RELEASE_CAPSULE | Freq: Every day | ORAL | Status: DC

## 2014-06-15 ENCOUNTER — Ambulatory Visit: Payer: Medicare Other

## 2014-06-15 VITALS — BP 138/78

## 2014-06-15 DIAGNOSIS — I10 Essential (primary) hypertension: Secondary | ICD-10-CM

## 2014-06-15 NOTE — Progress Notes (Signed)
Patient in for blood pressure evaluation.   Blood pressure checked manually in left arm.  Reading of 138/78 received.  Patient aware that she should continue meds as prescribed and keep follow up appointment

## 2014-06-30 ENCOUNTER — Other Ambulatory Visit: Payer: Self-pay

## 2014-06-30 ENCOUNTER — Other Ambulatory Visit: Payer: Self-pay | Admitting: Family Medicine

## 2014-06-30 MED ORDER — OMEPRAZOLE 20 MG PO CPDR: 20.0000 mg | DELAYED_RELEASE_CAPSULE | Freq: Every day | ORAL | Status: DC

## 2014-07-01 ENCOUNTER — Other Ambulatory Visit: Payer: Self-pay

## 2014-08-04 ENCOUNTER — Ambulatory Visit (INDEPENDENT_AMBULATORY_CARE_PROVIDER_SITE_OTHER): Payer: Medicare Other | Admitting: Family Medicine

## 2014-08-04 ENCOUNTER — Encounter (INDEPENDENT_AMBULATORY_CARE_PROVIDER_SITE_OTHER): Payer: Self-pay

## 2014-08-04 ENCOUNTER — Encounter: Payer: Self-pay | Admitting: Family Medicine

## 2014-08-04 VITALS — BP 130/78 | HR 92 | Resp 16 | Ht 60.0 in | Wt 210.0 lb

## 2014-08-04 DIAGNOSIS — I10 Essential (primary) hypertension: Secondary | ICD-10-CM | POA: Diagnosis not present

## 2014-08-04 DIAGNOSIS — Z23 Encounter for immunization: Secondary | ICD-10-CM | POA: Diagnosis not present

## 2014-08-04 DIAGNOSIS — J984 Other disorders of lung: Secondary | ICD-10-CM | POA: Diagnosis not present

## 2014-08-04 DIAGNOSIS — E559 Vitamin D deficiency, unspecified: Secondary | ICD-10-CM

## 2014-08-04 DIAGNOSIS — F419 Anxiety disorder, unspecified: Secondary | ICD-10-CM

## 2014-08-04 DIAGNOSIS — K219 Gastro-esophageal reflux disease without esophagitis: Secondary | ICD-10-CM

## 2014-08-04 DIAGNOSIS — E785 Hyperlipidemia, unspecified: Secondary | ICD-10-CM | POA: Diagnosis not present

## 2014-08-04 DIAGNOSIS — F418 Other specified anxiety disorders: Secondary | ICD-10-CM

## 2014-08-04 DIAGNOSIS — IMO0001 Reserved for inherently not codable concepts without codable children: Secondary | ICD-10-CM

## 2014-08-04 DIAGNOSIS — R7302 Impaired glucose tolerance (oral): Secondary | ICD-10-CM | POA: Diagnosis not present

## 2014-08-04 DIAGNOSIS — F329 Major depressive disorder, single episode, unspecified: Secondary | ICD-10-CM

## 2014-08-04 MED ORDER — LOSARTAN POTASSIUM-HCTZ 50-12.5 MG PO TABS: 1.0000 | ORAL_TABLET | Freq: Every day | ORAL | Status: DC

## 2014-08-04 MED ORDER — ALBUTEROL SULFATE HFA 108 (90 BASE) MCG/ACT IN AERS: 2.0000 | INHALATION_SPRAY | Freq: Four times a day (QID) | RESPIRATORY_TRACT | Status: DC | PRN

## 2014-08-04 MED ORDER — SERTRALINE HCL 100 MG PO TABS: ORAL_TABLET | ORAL | Status: DC

## 2014-08-04 MED ORDER — ATORVASTATIN CALCIUM 20 MG PO TABS: 20.0000 mg | ORAL_TABLET | Freq: Every day | ORAL | Status: DC

## 2014-08-04 NOTE — Assessment & Plan Note (Signed)
Controlled, no change in medication  

## 2014-08-04 NOTE — Assessment & Plan Note (Signed)
Hyperlipidemia:Low fat diet discussed and encouraged.  Updated lab needed at/ before next visit.  

## 2014-08-04 NOTE — Patient Instructions (Addendum)
Annual wellness in 4 month, call if you need me before   Please stop sodas , and eat healthily, we have  10 pounds to lose in the next 4 month  Fasting lipid, cmp, HBA1C, TSH, CBC and vit D in 4 months  Albuterol is sent in  Prevnar today

## 2014-08-04 NOTE — Assessment & Plan Note (Signed)
Controlled, no change in medication DASH diet and commitment to daily physical activity for a minimum of 30 minutes discussed and encouraged, as a part of hypertension management. The importance of attaining a healthy weight is also discussed.  

## 2014-08-04 NOTE — Assessment & Plan Note (Signed)
Vaccine administered at visit.  

## 2014-08-04 NOTE — Assessment & Plan Note (Signed)
Deteriorated. Patient re-educated about  the importance of commitment to a  minimum of 150 minutes of exercise per week. The importance of healthy food choices with portion control discussed. Encouraged to start a food diary, count calories and to consider  joining a support group. Sample diet sheets offered. Goals set by the patient for the next several months.    

## 2014-08-04 NOTE — Progress Notes (Signed)
   Subjective:    Patient ID: Mallory GrammesDiana M Wagner, female    DOB: 03-May-1946, 69 y.o.   MRN: [id number]  HPI The PT is here for follow up and re-evaluation of chronic medical conditions, medication management and review of any available recent lab and radiology data.  Preventive health is updated, specifically  Cancer screening and Immunization.   Questions or concerns regarding consultations or procedures which the PT has had in the interim are  addressed. The PT denies any adverse reactions to current medications since the last visit.  There are no new concerns. Has been over drinking sweet drinks, and eating excess sugary foods Plans to change this due to excess weight       Review of Systems See HPI Denies recent fever or chills. Denies sinus pressure, nasal congestion, ear pain or sore throat. Denies chest congestion, productive cough or wheezing. Denies chest pains, palpitations and leg swelling Denies abdominal pain, nausea, vomiting,diarrhea or constipation.   Denies dysuria, frequency, hesitancy or incontinence. Denies joint pain, swelling and limitation in mobility. Denies headaches, seizures, numbness, or tingling. Denies depression, anxiety or insomnia. Denies skin break down or rash.        Objective:   Physical Exam BP 130/78 mmHg  Pulse 92  Resp 16  Ht 5' (1.524 m)  Wt 210 lb (95.255 kg)  BMI 41.01 kg/m2  SpO2 96% Patient alert and oriented and in no cardiopulmonary distress.  HEENT: No facial asymmetry, EOMI,   oropharynx pink and moist.  Neck supple no JVD, no mass.  Chest: Clear to auscultation bilaterally.  CVS: S1, S2 no murmurs, no S3.Regular rate.  ABD: Soft non tender.   Ext: No edema  MS: Adequate ROM spine, shoulders, hips and knees.  Skin: Intact, no ulcerations or rash noted.  Psych: Good eye contact, normal affect. Memory intact not anxious or depressed appearing.  CNS: CN 2-12 intact, power,  normal throughout.no focal deficits  noted.        Assessment & Plan:  Essential hypertension Controlled, no change in medication DASH diet and commitment to daily physical activity for a minimum of 30 minutes discussed and encouraged, as a part of hypertension management. The importance of attaining a healthy weight is also discussed.    Hyperlipidemia LDL goal <100 Hyperlipidemia:Low fat diet discussed and encouraged.  Updated lab needed at/ before next visit.    Anxiety and depression Controlled, no change in medication    GERD Controlled, no change in medication    GERD (gastroesophageal reflux disease) Controlled, no change in medication    Obesity, Class II, BMI 35-39.9, with comorbidity Deteriorated. Patient re-educated about  the importance of commitment to a  minimum of 150 minutes of exercise per week. The importance of healthy food choices with portion control discussed. Encouraged to start a food diary, count calories and to consider  joining a support group. Sample diet sheets offered. Goals set by the patient for the next several months.      IGT (impaired glucose tolerance) Patient educated about the importance of limiting  Carbohydrate intake , the need to commit to daily physical activity for a minimum of 30 minutes , and to commit weight loss. The fact that changes in all these areas will reduce or eliminate all together the development of diabetes is stressed.      Need for vaccination with 13-polyvalent pneumococcal conjugate vaccine Vaccine administered at visit.

## 2014-08-04 NOTE — Assessment & Plan Note (Signed)
Patient educated about the importance of limiting  Carbohydrate intake , the need to commit to daily physical activity for a minimum of 30 minutes , and to commit weight loss. The fact that changes in all these areas will reduce or eliminate all together the development of diabetes is stressed.    

## 2014-09-04 DIAGNOSIS — J984 Other disorders of lung: Secondary | ICD-10-CM | POA: Diagnosis not present

## 2014-10-03 DIAGNOSIS — J984 Other disorders of lung: Secondary | ICD-10-CM | POA: Diagnosis not present

## 2014-10-16 ENCOUNTER — Other Ambulatory Visit: Payer: Self-pay | Admitting: Family Medicine

## 2014-10-20 ENCOUNTER — Telehealth: Payer: Self-pay | Admitting: *Deleted

## 2014-10-20 NOTE — Telephone Encounter (Signed)
Pt called [facility] stating her left side on her back has been hurting really bad, pt would like something to be called in. Please advise

## 2014-10-24 ENCOUNTER — Other Ambulatory Visit: Payer: Self-pay | Admitting: Family Medicine

## 2014-10-24 DIAGNOSIS — Z1231 Encounter for screening mammogram for malignant neoplasm of breast: Secondary | ICD-10-CM

## 2014-10-24 NOTE — Telephone Encounter (Signed)
Pain under her right rib toward her mid back. Hurting for 2 weeks now and sometimes the pain is a 10 and now its not as bad but still aching bad. Wants to know what to do for it. Please advise

## 2014-10-24 NOTE — Telephone Encounter (Signed)
Chest pain*(posterior) Ensure and document not light headed, passing out, nauseated, etc Trial of tramdol 50 mg one at bedtime as needed #20 only, no refill Document any fall or anything that may have brought on the symptom, cough, fever chills etc pls  Work in appt for either tuesd, Wed or Thursday AM only for tues or wed if possible

## 2014-10-24 NOTE — Telephone Encounter (Signed)
I spoke directly with pt,  I advised her to come in tomorrow around 10:30 to be seen as a work in

## 2014-10-25 ENCOUNTER — Ambulatory Visit (HOSPITAL_COMMUNITY)
Admission: RE | Admit: 2014-10-25 | Discharge: 2014-10-25 | Disposition: A | Discharge status: Home / Self Care | Payer: Medicare Other | Source: Ambulatory Visit | Attending: Family Medicine | Admitting: Family Medicine

## 2014-10-25 ENCOUNTER — Encounter: Payer: Self-pay | Admitting: Family Medicine

## 2014-10-25 ENCOUNTER — Ambulatory Visit (INDEPENDENT_AMBULATORY_CARE_PROVIDER_SITE_OTHER): Payer: Medicare Other | Admitting: Family Medicine

## 2014-10-25 VITALS — BP 142/88 | HR 69 | Resp 16 | Ht 60.0 in | Wt 207.0 lb

## 2014-10-25 DIAGNOSIS — F418 Other specified anxiety disorders: Secondary | ICD-10-CM

## 2014-10-25 DIAGNOSIS — R0789 Other chest pain: Secondary | ICD-10-CM | POA: Diagnosis not present

## 2014-10-25 DIAGNOSIS — F329 Major depressive disorder, single episode, unspecified: Secondary | ICD-10-CM

## 2014-10-25 DIAGNOSIS — M25521 Pain in right elbow: Secondary | ICD-10-CM

## 2014-10-25 DIAGNOSIS — G8929 Other chronic pain: Secondary | ICD-10-CM

## 2014-10-25 DIAGNOSIS — I10 Essential (primary) hypertension: Secondary | ICD-10-CM | POA: Diagnosis not present

## 2014-10-25 DIAGNOSIS — M5414 Radiculopathy, thoracic region: Secondary | ICD-10-CM

## 2014-10-25 DIAGNOSIS — E119 Type 2 diabetes mellitus without complications: Secondary | ICD-10-CM | POA: Insufficient documentation

## 2014-10-25 DIAGNOSIS — M5410 Radiculopathy, site unspecified: Secondary | ICD-10-CM

## 2014-10-25 DIAGNOSIS — J45909 Unspecified asthma, uncomplicated: Secondary | ICD-10-CM | POA: Insufficient documentation

## 2014-10-25 DIAGNOSIS — N3001 Acute cystitis with hematuria: Secondary | ICD-10-CM

## 2014-10-25 DIAGNOSIS — F32A Depression, unspecified: Secondary | ICD-10-CM

## 2014-10-25 DIAGNOSIS — E785 Hyperlipidemia, unspecified: Secondary | ICD-10-CM

## 2014-10-25 DIAGNOSIS — R079 Chest pain, unspecified: Secondary | ICD-10-CM | POA: Insufficient documentation

## 2014-10-25 DIAGNOSIS — F419 Anxiety disorder, unspecified: Secondary | ICD-10-CM

## 2014-10-25 LAB — POCT URINALYSIS DIPSTICK
Bilirubin, UA: NEGATIVE
GLUCOSE UA: NEGATIVE
Ketones, UA: NEGATIVE
NITRITE UA: NEGATIVE
Protein, UA: NEGATIVE
Spec Grav, UA: 1.025
UROBILINOGEN UA: 2
pH, UA: 6

## 2014-10-25 MED ORDER — PREDNISONE 5 MG PO TABS: 5.0000 mg | ORAL_TABLET | Freq: Two times a day (BID) | ORAL | Status: AC

## 2014-10-25 MED ORDER — METHYLPREDNISOLONE ACETATE 80 MG/ML IJ SUSP
80.0000 mg | Freq: Once | INTRAMUSCULAR | Status: AC
  Administered 2014-10-25: 80 mg via INTRAMUSCULAR

## 2014-10-25 MED ORDER — GABAPENTIN 300 MG PO CAPS: ORAL_CAPSULE | ORAL | Status: DC

## 2014-10-25 MED ORDER — KETOROLAC TROMETHAMINE 60 MG/2ML IM SOLN
60.0000 mg | Freq: Once | INTRAMUSCULAR | Status: AC
  Administered 2014-10-25: 60 mg via INTRAMUSCULAR

## 2014-10-25 NOTE — Patient Instructions (Signed)
F/u in 2 to 3 weeks, call if you need me befoore  You are being treated for inflammation of right elbow, if no better call for referral to ortho  You are treated for nerve pain, which I believe is from shingles.  Prednisone for 5 days and new higher dose of gabapentin is to be started (OK to take three 100 mg capsules daily till done)  CXr and RUQ ultrasound are being ordered due to symptoms also

## 2014-10-27 LAB — URINE CULTURE
Colony Count: NO GROWTH
ORGANISM ID, BACTERIA: NO GROWTH

## 2014-11-03 DIAGNOSIS — J984 Other disorders of lung: Secondary | ICD-10-CM | POA: Diagnosis not present

## 2014-11-08 ENCOUNTER — Ambulatory Visit (INDEPENDENT_AMBULATORY_CARE_PROVIDER_SITE_OTHER): Payer: Medicare Other | Admitting: Family Medicine

## 2014-11-08 ENCOUNTER — Encounter: Payer: Self-pay | Admitting: Family Medicine

## 2014-11-08 VITALS — BP 124/74 | HR 66 | Resp 16 | Ht 60.0 in | Wt 202.0 lb

## 2014-11-08 DIAGNOSIS — F32A Depression, unspecified: Secondary | ICD-10-CM

## 2014-11-08 DIAGNOSIS — M25521 Pain in right elbow: Secondary | ICD-10-CM

## 2014-11-08 DIAGNOSIS — G8929 Other chronic pain: Secondary | ICD-10-CM

## 2014-11-08 DIAGNOSIS — I10 Essential (primary) hypertension: Secondary | ICD-10-CM | POA: Diagnosis not present

## 2014-11-08 DIAGNOSIS — R7302 Impaired glucose tolerance (oral): Secondary | ICD-10-CM | POA: Diagnosis not present

## 2014-11-08 DIAGNOSIS — E559 Vitamin D deficiency, unspecified: Secondary | ICD-10-CM | POA: Diagnosis not present

## 2014-11-08 DIAGNOSIS — E785 Hyperlipidemia, unspecified: Secondary | ICD-10-CM | POA: Diagnosis not present

## 2014-11-08 DIAGNOSIS — F329 Major depressive disorder, single episode, unspecified: Secondary | ICD-10-CM

## 2014-11-08 DIAGNOSIS — F419 Anxiety disorder, unspecified: Secondary | ICD-10-CM

## 2014-11-08 DIAGNOSIS — F418 Other specified anxiety disorders: Secondary | ICD-10-CM

## 2014-11-08 LAB — CBC WITH DIFFERENTIAL/PLATELET
Basophils Absolute: 0 10*3/uL (ref 0.0–0.1)
Basophils Relative: 0 % (ref 0–1)
EOS ABS: 0.2 10*3/uL (ref 0.0–0.7)
EOS PCT: 2 % (ref 0–5)
HCT: 42.2 % (ref 36.0–46.0)
HEMOGLOBIN: 13.4 g/dL (ref 12.0–15.0)
LYMPHS ABS: 3.4 10*3/uL (ref 0.7–4.0)
Lymphocytes Relative: 29 % (ref 12–46)
MCH: 28.8 pg (ref 26.0–34.0)
MCHC: 31.8 g/dL (ref 30.0–36.0)
MCV: 90.6 fL (ref 78.0–100.0)
MPV: 12.2 fL (ref 8.6–12.4)
Monocytes Absolute: 0.8 10*3/uL (ref 0.1–1.0)
Monocytes Relative: 7 % (ref 3–12)
NEUTROS ABS: 7.3 10*3/uL (ref 1.7–7.7)
NEUTROS PCT: 62 % (ref 43–77)
Platelets: 214 10*3/uL (ref 150–400)
RBC: 4.66 MIL/uL (ref 3.87–5.11)
RDW: 14.4 % (ref 11.5–15.5)
WBC: 11.8 10*3/uL — AB (ref 4.0–10.5)

## 2014-11-08 MED ORDER — METOPROLOL SUCCINATE ER 50 MG PO TB24: 50.0000 mg | ORAL_TABLET | Freq: Every day | ORAL | Status: DC

## 2014-11-08 NOTE — Patient Instructions (Signed)
Annual wellness in 4 month, call if you need me before  Labs today  Glad you are improved  No changes in medication  It is important that you exercise regularly at least 30 minutes 5 times a week. If you develop chest pain, have severe difficulty breathing, or feel very tired, stop exercising immediately and seek medical attention   Follow low carb diet, and aim to lose weight  Thanks [facility], we consider it a privelige to serve you.

## 2014-11-08 NOTE — Progress Notes (Signed)
Subjective:    Patient ID: [name], female    DOB: [birth year], 69 y.o.   MRN: [id number]  HPI The PT is here for follow up and re-evaluation of chronic medical conditions, medication management and review of any available recent lab and radiology data.  Preventive health is updated, specifically  Cancer screening and Immunization.   Questions or concerns regarding consultations or procedures which the PT has had in the interim are  addressed. The PT denies any adverse reactions to current medications since the last visit.  There are no new concerns. Acute pain has resolved  There are no specific complaints       Review of Systems See HPI Denies recent fever or chills. Denies sinus pressure, nasal congestion, ear pain or sore throat. Denies chest congestion, productive cough or wheezing. Denies chest pains, palpitations and leg swelling Denies abdominal pain, nausea, vomiting,diarrhea or constipation.   Denies dysuria, frequency, hesitancy or incontinence. Denies joint pain, swelling and limitation in mobility. Denies headaches, seizures, numbness, or tingling. Denies depression, anxiety or insomnia. Denies skin break down or rash.        Objective:   Physical Exam BP 124/74 mmHg  Pulse 66  Resp 16  Ht 5' (1.524 m)  Wt 202 lb (91.627 kg)  BMI 39.45 kg/m2  SpO2 96% Patient alert and oriented and in no cardiopulmonary distress.  HEENT: No facial asymmetry, EOMI,   oropharynx pink and moist.  Neck supple no JVD, no mass.  Chest: Clear to auscultation bilaterally.  CVS: S1, S2 no murmurs, no S3.Regular rate.  ABD: Soft non tender.   Ext: No edema  MS: Adequate ROM spine, shoulders, hips and knees.  Skin: Intact, no ulcerations or rash noted.  Psych: Good eye contact, normal affect. Memory intact not anxious or depressed appearing.  CNS: CN 2-12 intact, power,  normal throughout.no focal deficits noted.        Assessment & Plan:  Essential  hypertension Controlled, no change in medication DASH diet and commitment to daily physical activity for a minimum of 30 minutes discussed and encouraged, as a part of hypertension management. The importance of attaining a healthy weight is also discussed.  BP/Weight 11/08/2014 10/25/2014 08/04/2014 06/15/2014 05/05/2014 02/21/2014 12/27/2013  Systolic BP 124 142 130 138 128 128 118  Diastolic BP 74 88 78 78 76 64 78  Wt. (Lbs) 202 207 210 - 203 195 188.12  BMI 39.45 40.43 41.01 - 39.65 38.08 36.74         Chronic pain of right elbow Markedly improved, no need for ortho nreferral   IGT (impaired glucose tolerance)  Unchanged Patient educated about the importance of limiting  Carbohydrate intake , the need to commit to daily physical activity for a minimum of 30 minutes , and to commit weight loss. The fact that changes in all these areas will reduce or eliminate all together the development of diabetes is stressed.   Diabetic Labs Latest Ref Rng 11/08/2014 04/29/2014 12/16/2013 09/15/2013 04/21/2013  HbA1c <5.7 % 6.0(H) 6.0(H) 6.0(H) 5.8(H) 5.6  Microalbumin 0.00 - 1.89 mg/dL - - - - -  Micro/Creat Ratio 0.0 - 30.0 mg/g - - - - -  Chol 0 - 200 mg/dL 409 811 914 782(N) 562  HDL >=46 mg/dL 89 86 80 74 81  Calc LDL 0 - 99 mg/dL 91 86 95 130(Q) 657(Q)  Triglycerides <150 mg/dL 61 48 40 88 48  Creatinine 0.50 - 1.10 mg/dL 4.69 6.29 5.28 4.13 2.44  BP/Weight 11/08/2014 10/25/2014 08/04/2014 06/15/2014 05/05/2014 02/21/2014 12/27/2013  Systolic BP 124 142 130 138 128 128 118  Diastolic BP 74 88 78 78 76 64 78  Wt. (Lbs) 202 207 210 - 203 195 188.12  BMI 39.45 40.43 41.01 - 39.65 38.08 36.74   Foot/eye exam completion dates 03/13/2010  Foot exam Order yes  Foot Form Completion -        Anxiety and depression Stable and well controlled, no change in medication.

## 2014-11-09 ENCOUNTER — Other Ambulatory Visit: Payer: Self-pay

## 2014-11-09 LAB — LIPID PANEL
CHOL/HDL RATIO: 2.2 ratio
Cholesterol: 192 mg/dL (ref 0–200)
HDL: 89 mg/dL (ref >=46)
LDL Cholesterol: 91 mg/dL (ref 0–99)
TRIGLYCERIDES: 61 mg/dL (ref <150)
VLDL: 12 mg/dL (ref 0–40)

## 2014-11-09 LAB — COMPREHENSIVE METABOLIC PANEL
ALBUMIN: 3.8 g/dL (ref 3.5–5.2)
ALK PHOS: 78 U/L (ref 39–117)
ALT: 15 U/L (ref 0–35)
AST: 20 U/L (ref 0–37)
BUN: 14 mg/dL (ref 6–23)
CO2: 26 meq/L (ref 19–32)
Calcium: 9.5 mg/dL (ref 8.4–10.5)
Chloride: 104 mEq/L (ref 96–112)
Creat: 0.73 mg/dL (ref 0.50–1.10)
GLUCOSE: 89 mg/dL (ref 70–99)
POTASSIUM: 4.1 meq/L (ref 3.5–5.3)
Sodium: 139 mEq/L (ref 135–145)
Total Bilirubin: 0.8 mg/dL (ref 0.2–1.2)
Total Protein: 7 g/dL (ref 6.0–8.3)

## 2014-11-09 LAB — TSH: TSH: 1.799 u[IU]/mL (ref 0.350–4.500)

## 2014-11-09 LAB — HEMOGLOBIN A1C
HEMOGLOBIN A1C: 6 % — AB (ref <5.7)
Mean Plasma Glucose: 126 mg/dL — ABNORMAL HIGH (ref <117)

## 2014-11-09 LAB — VITAMIN D 25 HYDROXY (VIT D DEFICIENCY, FRACTURES): VIT D 25 HYDROXY: 33 ng/mL (ref 30–100)

## 2014-11-27 DIAGNOSIS — N3001 Acute cystitis with hematuria: Secondary | ICD-10-CM | POA: Insufficient documentation

## 2014-11-27 NOTE — Assessment & Plan Note (Signed)
Controlled, no change in medication  

## 2014-11-27 NOTE — Assessment & Plan Note (Signed)
Stable, treated by pulmonary on daily medication

## 2014-11-27 NOTE — Assessment & Plan Note (Signed)
Acute flare, short anti inflammatory course , if persists , will refer to ortho

## 2014-11-27 NOTE — Assessment & Plan Note (Signed)
Stable and well controlled, no change in medication.

## 2014-11-27 NOTE — Progress Notes (Signed)
Subjective:    Patient ID: [name], female    DOB: March 16, 1946, 69 y.o.   MRN: [id number]  HPI Pt in with main c/o 1 week h/o deep burning pain from right upper abdomen radiating around to under the armpit. Has had no recent fever or chills, denies any rash. C//o intermittent nausea and bloating with excess belching, no change in stool , no hematemesis or vomiting.     Review of Systems See HPI Denies recent fever or chills. Denies sinus pressure, nasal congestion, ear pain or sore throat. Denies chest congestion, productive cough or wheezing. Denies chest pains, palpitations and leg swelling Denies abdominal pain, nausea, vomiting,diarrhea or constipation.   Mild dysuria and  Dysuria in past 3 days frequency denies , hesitancy or incontinence. Denies joint pain, swelling and limitation in mobility. Denies headaches, seizures, numbness, or tingling. Denies uncontrolled  depression, anxiety or insomnia. Denies skin break down or rash.        Objective:   Physical Exam  BP 142/88 mmHg  Pulse 69  Resp 16  Ht 5' (1.524 m)  Wt 207 lb (93.895 kg)  BMI 40.43 kg/m2  SpO2 98% Patient alert and oriented and in no cardiopulmonary distress.Pt in pain  HEENT: No facial asymmetry, EOMI,   oropharynx pink and moist.  Neck supple no JVD, no mass.  Chest: Clear to auscultation bilaterally.  CVS: S1, S2 no murmurs, no S3.Regular rate.  ABD: Soft RUQ tenderness no guarding or rebound. Normal bs, no organomegaly or mass Ext: No edema  MS: Adequate ROM spine, shoulders, hips and knees.  Skin: Intact, no ulcerations or rash noted.  Psych: Good eye contact, normal affect. Memory intact not anxious or depressed appearing.  CNS: CN 2-12 intact, power,  normal throughout.no focal deficits noted.       Assessment & Plan:  Radicular pain of thoracic region Uncontrolled.Toradol and depo medrol administered IM in the office , to be followed by a short course of oral  prednisone and NSAIDS. CXr and also RUQ US ordered. Pain felt to be due to zoster, based on clinical exam and presentation, gabpentin also prescribed short term   Essential hypertension Uncontrolled ,has close follow up. DASH diet and commitment to daily physical activity for a minimum of 30 minutes discussed and encouraged, as a part of hypertension management. The importance of attaining a healthy weight is also discussed.  BP/Weight 11/08/2014 10/25/2014 08/04/2014 06/15/2014 05/05/2014 02/21/2014 12/27/2013  Systolic BP 124 142 130 138 128 128 118  Diastolic BP 74 88 78 78 76 64 78  Wt. (Lbs) 202 207 210 - 203 195 188.12  BMI 39.45 40.43 41.01 - 39.65 38.08 36.74         Hyperlipidemia LDL goal <100 .Controlled, no change in medication Hyperlipidemia:Low fat diet discussed and encouraged.   Lipid Panel  Lab Results  Component Value Date   CHOL 192 11/08/2014   HDL 89 11/08/2014   LDLCALC 91 11/08/2014   TRIG 61 11/08/2014   CHOLHDL 2.2 11/08/2014         Chronic pain of right elbow Acute flare, short anti inflammatory course , if persists , will refer to ortho   Chest pain Chest pain in hypertensive patient, will obtain CXR none in past 2 years   Acute cystitis with hematuria Abnormal urinalysis , will send for c/s   HYPERTENSION, PULMONARY Stable, treated by pulmonary on daily medication   Anxiety and depression Controlled, no change in medication

## 2014-11-27 NOTE — Assessment & Plan Note (Addendum)
Uncontrolled.Toradol and depo medrol administered IM in the office , to be followed by a short course of oral prednisone and NSAIDS. CXr and also RUQ US ordered. Pain felt to be due to zoster, based on clinical exam and presentation, gabpentin also prescribed short term

## 2014-11-27 NOTE — Assessment & Plan Note (Signed)
.  Controlled, no change in medication Hyperlipidemia:Low fat diet discussed and encouraged.   Lipid Panel  Lab Results  Component Value Date   CHOL 192 11/08/2014   HDL 89 11/08/2014   LDLCALC 91 11/08/2014   TRIG 61 11/08/2014   CHOLHDL 2.2 11/08/2014

## 2014-11-27 NOTE — Assessment & Plan Note (Signed)
Chest pain in hypertensive patient, will obtain CXR none in past 2 years

## 2014-11-27 NOTE — Assessment & Plan Note (Signed)
Abnormal urinalysis , will send for c/s

## 2014-11-27 NOTE — Assessment & Plan Note (Signed)
Markedly improved, no need for ortho nreferral

## 2014-11-27 NOTE — Assessment & Plan Note (Signed)
  Unchanged Patient educated about the importance of limiting  Carbohydrate intake , the need to commit to daily physical activity for a minimum of 30 minutes , and to commit weight loss. The fact that changes in all these areas will reduce or eliminate all together the development of diabetes is stressed.   Diabetic Labs Latest Ref Rng 11/08/2014 04/29/2014 12/16/2013 09/15/2013 04/21/2013  HbA1c <5.7 % 6.0(H) 6.0(H) 6.0(H) 5.8(H) 5.6  Microalbumin 0.00 - 1.89 mg/dL - - - - -  Micro/Creat Ratio 0.0 - 30.0 mg/g - - - - -  Chol 0 - 200 mg/dL 161192 096182 045183 409(W222(H) 119194  HDL >=46 mg/dL 89 86 80 74 81  Calc LDL 0 - 99 mg/dL 91 86 95 147(W130(H) 295(A103(H)  Triglycerides <150 mg/dL 61 48 40 88 48  Creatinine 0.50 - 1.10 mg/dL 2.130.73 0.861.02 5.781.03 4.690.71 6.290.88   BP/Weight 11/08/2014 10/25/2014 08/04/2014 06/15/2014 05/05/2014 02/21/2014 12/27/2013  Systolic BP 124 142 130 138 128 128 118  Diastolic BP 74 88 78 78 76 64 78  Wt. (Lbs) 202 207 210 - 203 195 188.12  BMI 39.45 40.43 41.01 - 39.65 38.08 36.74   Foot/eye exam completion dates 03/13/2010  Foot exam Order yes  Foot Form Completion -

## 2014-11-27 NOTE — Assessment & Plan Note (Signed)
Controlled, no change in medication DASH diet and commitment to daily physical activity for a minimum of 30 minutes discussed and encouraged, as a part of hypertension management. The importance of attaining a healthy weight is also discussed.  BP/Weight 11/08/2014 10/25/2014 08/04/2014 06/15/2014 05/05/2014 02/21/2014 12/27/2013  Systolic BP 124 142 130 138 128 128 118  Diastolic BP 74 88 78 78 76 64 78  Wt. (Lbs) 202 207 210 - 203 195 188.12  BMI 39.45 40.43 41.01 - 39.65 38.08 36.74

## 2014-11-27 NOTE — Assessment & Plan Note (Signed)
Uncontrolled ,has close follow up. DASH diet and commitment to daily physical activity for a minimum of 30 minutes discussed and encouraged, as a part of hypertension management. The importance of attaining a healthy weight is also discussed.  BP/Weight 11/08/2014 10/25/2014 08/04/2014 06/15/2014 05/05/2014 02/21/2014 12/27/2013  Systolic BP 124 142 130 138 128 128 118  Diastolic BP 74 88 78 78 76 64 78  Wt. (Lbs) 202 207 210 - 203 195 188.12  BMI 39.45 40.43 41.01 - 39.65 38.08 36.74

## 2014-12-01 ENCOUNTER — Ambulatory Visit (HOSPITAL_COMMUNITY)
Admission: RE | Admit: 2014-12-01 | Discharge: 2014-12-01 | Disposition: A | Discharge status: Home / Self Care | Payer: Medicare Other | Source: Ambulatory Visit | Attending: Family Medicine | Admitting: Family Medicine

## 2014-12-01 DIAGNOSIS — Z1231 Encounter for screening mammogram for malignant neoplasm of breast: Secondary | ICD-10-CM | POA: Insufficient documentation

## 2014-12-03 DIAGNOSIS — J984 Other disorders of lung: Secondary | ICD-10-CM | POA: Diagnosis not present

## 2014-12-12 ENCOUNTER — Encounter: Payer: Medicare Other | Admitting: Family Medicine

## 2015-01-03 DIAGNOSIS — J984 Other disorders of lung: Secondary | ICD-10-CM | POA: Diagnosis not present

## 2015-01-07 ENCOUNTER — Other Ambulatory Visit: Payer: Self-pay | Admitting: Family Medicine

## 2015-01-16 ENCOUNTER — Other Ambulatory Visit: Payer: Self-pay | Admitting: Family Medicine

## 2015-02-02 DIAGNOSIS — J984 Other disorders of lung: Secondary | ICD-10-CM | POA: Diagnosis not present

## 2015-02-25 ENCOUNTER — Other Ambulatory Visit: Payer: Self-pay | Admitting: Family Medicine

## 2015-03-05 DIAGNOSIS — J984 Other disorders of lung: Secondary | ICD-10-CM | POA: Diagnosis not present

## 2015-03-06 ENCOUNTER — Other Ambulatory Visit: Payer: Self-pay | Admitting: Family Medicine

## 2015-03-30 ENCOUNTER — Encounter: Payer: Self-pay | Admitting: Family Medicine

## 2015-03-30 ENCOUNTER — Ambulatory Visit (INDEPENDENT_AMBULATORY_CARE_PROVIDER_SITE_OTHER): Payer: Medicare Other | Admitting: Family Medicine

## 2015-03-30 VITALS — BP 138/80 | HR 78 | Resp 16 | Ht 60.0 in | Wt 200.1 lb

## 2015-03-30 DIAGNOSIS — E785 Hyperlipidemia, unspecified: Secondary | ICD-10-CM

## 2015-03-30 DIAGNOSIS — Z0000 Encounter for general adult medical examination without abnormal findings: Secondary | ICD-10-CM

## 2015-03-30 DIAGNOSIS — I272 Pulmonary hypertension, unspecified: Secondary | ICD-10-CM

## 2015-03-30 DIAGNOSIS — R7302 Impaired glucose tolerance (oral): Secondary | ICD-10-CM

## 2015-03-30 DIAGNOSIS — I10 Essential (primary) hypertension: Secondary | ICD-10-CM

## 2015-03-30 DIAGNOSIS — Z23 Encounter for immunization: Secondary | ICD-10-CM | POA: Insufficient documentation

## 2015-03-30 DIAGNOSIS — Z1159 Encounter for screening for other viral diseases: Secondary | ICD-10-CM

## 2015-03-30 NOTE — Assessment & Plan Note (Signed)
After obtaining informed consent, the vaccine is  administered by LPN.  

## 2015-03-30 NOTE — Patient Instructions (Signed)
F/u early February, call if you need me before  Fasting lipid, cmp and eGFr, hBA1C and hepatitis Cearly Novemebr   Please work on good  health habits so that your health will improve. 1. Commitment to daily physical activity for 30 to 60  minutes, if you are able to do this.  2. Commitment to wise food choices. Aim for half of your  food intake to be vegetable and fruit, one quarter starchy foods, and one quarter protein. Try to eat on a regular schedule  3 meals per day, snacking between meals should be limited to vegetables or fruits or small portions of nuts. 64 ounces of water per day is generally recommended, unless you have specific health conditions, like heart failure or kidney failure where you will need to limit fluid intake.  3. Commitment to sufficient and a  good quality of physical and mental rest daily, generally between 6 to 8 hours per day.  WITH PERSISTANCE AND PERSEVERANCE, THE IMPOSSIBLE , BECOMES THE NORM!  Thanks for choosing [facility], we consider it a privelige to serve you. Flu vaccine today 

## 2015-03-30 NOTE — Progress Notes (Signed)
Subjective:    Patient ID: [name], female    DOB: 11/02/1945, 69 y.o.   MRN: [id number]  HPI Preventive Screening-Counseling & Management   Patient present here today for a Medicare annual wellness visit.   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 No  Over the past two weeks, have you felt little interest or pleasure in doing things? No  Have you lost interest or pleasure in daily life? No  Do you often feel hopeless? No  Do you cry easily over simple problems? No   Activities of Daily Living  In your present state of health, do you have any difficulty performing the following activities?  Driving?: No Managing money?: No Feeding yourself?:No Getting from bed to chair?:No Climbing a flight of stairs?:has arthritis in her knees but states she can climb stairs  Preparing food and eating?:No Bathing or showering?:No Getting dressed?:No Getting to the toilet?:No Using the toilet?:No Moving around from place to place?: No  Fall Risk Assessment In the past year have you fallen or had a near fall?:No Are you currently taking any medications that make you dizziy?:No   Hearing Difficulties: No Do you often ask people to speak up or repeat themselves?:No Do you experience ringing or noises  in your ears?:No Do you have difficulty understanding soft or whispered voices?:No  Cognitive Testing  Alert? Yes Normal Appearance?Yes  Oriented to person? Yes Place? Yes  Time? Yes  Displays appropriate judgment?Yes  Can read the correct time from a watch face? yes Are you having problems remembering things? No   Advanced Directives have been discussed with the patient?Yes, patient has a living will    List the Names of Other Physician/Practitioners you currently use:  Dr Juanetta Gosling - Pulmonary  Dr Lita Mains- opthla  Indicate any recent Medical Services you may have received from other than [facility] providers in the past year (date may be approximate).   Assessment:    Annual Wellness Exam   Plan:      Medicare Attestation  I have personally reviewed:  The patient's medical and social history  Their use of alcohol, tobacco or illicit drugs  Their current medications and supplements  The patient's functional ability including ADLs,fall risks, home safety risks, cognitive, and hearing and visual impairment  Diet and physical activities  Evidence for depression or mood disorders  The patient's weight, height, BMI, and visual acuity have been recorded in the chart. I have made referrals, counseling, and provided education to the patient based on review of the above and I have provided the patient with a written personalized care plan for preventive services.      Review of Systems     Objective:   Physical Exam BP 138/80 mmHg  Pulse 78  Resp  16  Ht 5' (1.524 m)  Wt 200 lb 1.9 oz (90.774 kg)  BMI 39.08 kg/m2  SpO2 97%        Assessment & Plan:  Medicare annual wellness visit: As documented    Need for influenza vaccine: Vaccine administered by LPN after informed consentr

## 2015-03-30 NOTE — Assessment & Plan Note (Signed)

## 2015-03-31 ENCOUNTER — Encounter: Payer: Self-pay | Admitting: Family Medicine

## 2015-03-31 DIAGNOSIS — Z23 Encounter for immunization: Secondary | ICD-10-CM | POA: Insufficient documentation

## 2015-03-31 NOTE — Assessment & Plan Note (Signed)
After obtaining informed consent, the vaccine is  administered by LPN.  

## 2015-04-05 DIAGNOSIS — J984 Other disorders of lung: Secondary | ICD-10-CM | POA: Diagnosis not present

## 2015-04-29 ENCOUNTER — Other Ambulatory Visit: Payer: Self-pay | Admitting: Family Medicine

## 2015-05-03 DIAGNOSIS — E1165 Type 2 diabetes mellitus with hyperglycemia: Secondary | ICD-10-CM | POA: Diagnosis not present

## 2015-05-03 DIAGNOSIS — I10 Essential (primary) hypertension: Secondary | ICD-10-CM | POA: Diagnosis not present

## 2015-05-03 DIAGNOSIS — K210 Gastro-esophageal reflux disease with esophagitis: Secondary | ICD-10-CM | POA: Diagnosis not present

## 2015-05-03 DIAGNOSIS — I270 Primary pulmonary hypertension: Secondary | ICD-10-CM | POA: Diagnosis not present

## 2015-05-04 ENCOUNTER — Other Ambulatory Visit (HOSPITAL_COMMUNITY): Payer: Self-pay | Admitting: Radiology

## 2015-05-04 DIAGNOSIS — I272 Pulmonary hypertension, unspecified: Secondary | ICD-10-CM

## 2015-05-05 DIAGNOSIS — J984 Other disorders of lung: Secondary | ICD-10-CM | POA: Diagnosis not present

## 2015-05-08 ENCOUNTER — Ambulatory Visit (HOSPITAL_COMMUNITY)
Admission: RE | Admit: 2015-05-08 | Discharge: 2015-05-08 | Disposition: A | Discharge status: Home / Self Care | Payer: Medicare Other | Source: Ambulatory Visit | Attending: Pulmonary Disease | Admitting: Pulmonary Disease

## 2015-05-08 ENCOUNTER — Other Ambulatory Visit (HOSPITAL_COMMUNITY): Payer: Self-pay | Admitting: Pulmonary Disease

## 2015-05-08 DIAGNOSIS — I270 Primary pulmonary hypertension: Secondary | ICD-10-CM | POA: Insufficient documentation

## 2015-05-08 DIAGNOSIS — R0602 Shortness of breath: Secondary | ICD-10-CM | POA: Insufficient documentation

## 2015-05-17 ENCOUNTER — Ambulatory Visit (HOSPITAL_COMMUNITY)
Admission: RE | Admit: 2015-05-17 | Discharge: 2015-05-17 | Disposition: A | Discharge status: Home / Self Care | Payer: Medicare Other | Source: Ambulatory Visit | Attending: Pulmonary Disease | Admitting: Pulmonary Disease

## 2015-05-17 DIAGNOSIS — I272 Other secondary pulmonary hypertension: Secondary | ICD-10-CM | POA: Insufficient documentation

## 2015-05-17 MED ORDER — ALBUTEROL SULFATE (2.5 MG/3ML) 0.083% IN NEBU
2.5000 mg | INHALATION_SOLUTION | Freq: Once | RESPIRATORY_TRACT | Status: AC
  Administered 2015-05-17: 2.5 mg via RESPIRATORY_TRACT

## 2015-05-19 LAB — PULMONARY FUNCTION TEST
DL/VA % PRED: 123 %
DL/VA: 5.22 ml/min/mmHg/L
DLCO UNC % PRED: 71 %
DLCO unc: 13.49 ml/min/mmHg
FEF 25-75 POST: 1.38 L/s
FEF 25-75 PRE: 1.38 L/s
FEF2575-%CHANGE-POST: 0 %
FEF2575-%PRED-POST: 91 %
FEF2575-%PRED-PRE: 90 %
FEV1-%Change-Post: 0 %
FEV1-%PRED-POST: 80 %
FEV1-%Pred-Pre: 80 %
FEV1-Post: 1.25 L
FEV1-Pre: 1.26 L
FEV1FVC-%CHANGE-POST: 2 %
FEV1FVC-%PRED-PRE: 108 %
FEV6-%CHANGE-POST: -2 %
FEV6-%PRED-POST: 75 %
FEV6-%Pred-Pre: 77 %
FEV6-Post: 1.45 L
FEV6-Pre: 1.49 L
FEV6FVC-%Pred-Post: 104 %
FEV6FVC-%Pred-Pre: 104 %
FVC-%CHANGE-POST: -2 %
FVC-%PRED-POST: 71 %
FVC-%Pred-Pre: 73 %
FVC-Post: 1.45 L
FVC-Pre: 1.49 L
POST FEV1/FVC RATIO: 86 %
Post FEV6/FVC ratio: 100 %
Pre FEV1/FVC ratio: 84 %
Pre FEV6/FVC Ratio: 100 %
RV % pred: 85 %
RV: 1.65 L
TLC % pred: 71 %
TLC: 3.17 L

## 2015-05-25 DIAGNOSIS — R7302 Impaired glucose tolerance (oral): Secondary | ICD-10-CM | POA: Diagnosis not present

## 2015-05-25 DIAGNOSIS — Z1159 Encounter for screening for other viral diseases: Secondary | ICD-10-CM | POA: Diagnosis not present

## 2015-05-25 DIAGNOSIS — E785 Hyperlipidemia, unspecified: Secondary | ICD-10-CM | POA: Diagnosis not present

## 2015-05-25 DIAGNOSIS — I10 Essential (primary) hypertension: Secondary | ICD-10-CM | POA: Diagnosis not present

## 2015-05-25 LAB — HEMOGLOBIN A1C
HEMOGLOBIN A1C: 6 % — AB (ref <5.7)
MEAN PLASMA GLUCOSE: 126 mg/dL — AB (ref <117)

## 2015-05-26 LAB — COMPLETE METABOLIC PANEL WITH GFR
ALT: 13 U/L (ref 6–29)
AST: 20 U/L (ref 10–35)
Albumin: 3.7 g/dL (ref 3.6–5.1)
Alkaline Phosphatase: 66 U/L (ref 33–130)
BUN: 12 mg/dL (ref 7–25)
CHLORIDE: 103 mmol/L (ref 98–110)
CO2: 31 mmol/L (ref 20–31)
CREATININE: 0.74 mg/dL (ref 0.50–0.99)
Calcium: 9 mg/dL (ref 8.6–10.4)
GFR, Est Non African American: 84 mL/min (ref >=60)
GLUCOSE: 97 mg/dL (ref 65–99)
Potassium: 3.8 mmol/L (ref 3.5–5.3)
SODIUM: 143 mmol/L (ref 135–146)
Total Bilirubin: 0.8 mg/dL (ref 0.2–1.2)
Total Protein: 6.7 g/dL (ref 6.1–8.1)

## 2015-05-26 LAB — HEPATITIS C ANTIBODY: HCV Ab: NEGATIVE

## 2015-05-26 LAB — LIPID PANEL
Cholesterol: 165 mg/dL (ref 125–200)
HDL: 75 mg/dL (ref >=46)
LDL CALC: 75 mg/dL (ref <130)
TRIGLYCERIDES: 73 mg/dL (ref <150)
Total CHOL/HDL Ratio: 2.2 Ratio (ref <=5.0)
VLDL: 15 mg/dL (ref <30)

## 2015-06-05 DIAGNOSIS — J984 Other disorders of lung: Secondary | ICD-10-CM | POA: Diagnosis not present

## 2015-06-06 ENCOUNTER — Other Ambulatory Visit: Payer: Self-pay | Admitting: Family Medicine

## 2015-07-05 DIAGNOSIS — J984 Other disorders of lung: Secondary | ICD-10-CM | POA: Diagnosis not present

## 2015-07-26 ENCOUNTER — Other Ambulatory Visit: Payer: Self-pay | Admitting: Family Medicine

## 2015-08-05 DIAGNOSIS — J984 Other disorders of lung: Secondary | ICD-10-CM | POA: Diagnosis not present

## 2015-08-08 ENCOUNTER — Other Ambulatory Visit: Payer: Self-pay | Admitting: Family Medicine

## 2015-09-02 NOTE — Progress Notes (Signed)
REVIEWED-NO ADDITIONAL RECOMMENDATIONS. 

## 2015-09-05 DIAGNOSIS — J984 Other disorders of lung: Secondary | ICD-10-CM | POA: Diagnosis not present

## 2015-09-14 ENCOUNTER — Encounter: Payer: Self-pay | Admitting: Family Medicine

## 2015-09-14 ENCOUNTER — Ambulatory Visit (INDEPENDENT_AMBULATORY_CARE_PROVIDER_SITE_OTHER): Payer: Medicare Other | Admitting: Family Medicine

## 2015-09-14 VITALS — BP 130/80 | HR 82 | Resp 16 | Ht 60.0 in | Wt 204.8 lb

## 2015-09-14 DIAGNOSIS — E785 Hyperlipidemia, unspecified: Secondary | ICD-10-CM

## 2015-09-14 DIAGNOSIS — R7302 Impaired glucose tolerance (oral): Secondary | ICD-10-CM | POA: Diagnosis not present

## 2015-09-14 DIAGNOSIS — I10 Essential (primary) hypertension: Secondary | ICD-10-CM

## 2015-09-14 DIAGNOSIS — E559 Vitamin D deficiency, unspecified: Secondary | ICD-10-CM | POA: Diagnosis not present

## 2015-09-14 DIAGNOSIS — I272 Other secondary pulmonary hypertension: Secondary | ICD-10-CM

## 2015-09-14 DIAGNOSIS — K219 Gastro-esophageal reflux disease without esophagitis: Secondary | ICD-10-CM

## 2015-09-14 DIAGNOSIS — Z23 Encounter for immunization: Secondary | ICD-10-CM

## 2015-09-14 DIAGNOSIS — F419 Anxiety disorder, unspecified: Secondary | ICD-10-CM

## 2015-09-14 DIAGNOSIS — F329 Major depressive disorder, single episode, unspecified: Secondary | ICD-10-CM

## 2015-09-14 DIAGNOSIS — Z6841 Body Mass Index (BMI) 40.0 and over, adult: Secondary | ICD-10-CM

## 2015-09-14 DIAGNOSIS — F418 Other specified anxiety disorders: Secondary | ICD-10-CM

## 2015-09-14 NOTE — Patient Instructions (Signed)
Annual physical exam in 2 month, call if you need me sooner  Fasting labs in 2 months  PLEASE STOP SODAS!!!!  CONTINUE WATER AEROBICS DAILY AND CROCHET!  Pneumonia 23 today  Thanks for choosing [facility], we consider it a privelige to serve you.

## 2015-09-14 NOTE — Progress Notes (Signed)
Subjective:    Patient ID: [name], female    DOB: 01/30/1946, 70 y.o.   MRN: [id number]  HPI   [name]     MRN: [id number]      DOB: 10/19/1945   HPI [name] is here for follow up and re-evaluation of chronic medical conditions, medication management and review of any available recent lab and radiology data.  Preventive health is updated, specifically  Cancer screening and Immunization.   Questions or concerns regarding consultations or procedures which the PT has had in the interim are  addressed. The PT denies any adverse reactions to current medications since the last visit.  There are no new concerns.  There are no specific complaints   ROS Denies recent fever or chills. Denies sinus pressure, nasal congestion, ear pain or sore throat. Denies chest congestion, productive cough or wheezing. Denies chest pains, palpitations and leg swelling Denies abdominal pain, nausea, vomiting,diarrhea or constipation.   Denies dysuria, frequency, hesitancy or incontinence. Denies joint pain, swelling and limitation in mobility. Denies headaches, seizures, numbness, or tingling. Denies depression, anxiety or insomnia. Denies skin break down or rash.   PE  BP 130/80 mmHg  Pulse 82  Resp 16  Ht 5' (1.524 m)  Wt 204 lb 12.8 oz (92.897 kg)  BMI 40.00 kg/m2  SpO2 95%  Patient alert and oriented and in no cardiopulmonary distress.  HEENT: No facial asymmetry, EOMI,   oropharynx pink and moist.  Neck supple no JVD, no mass.  Chest: Clear to auscultation bilaterally.  CVS: S1, S2 no murmurs, no S3.Regular rate.  ABD: Soft non tender.   Ext: No edema  MS: Adequate ROM spine, shoulders, hips and knees.  Skin: Intact, no ulcerations or rash noted.  Psych: Good eye contact, normal affect. Memory intact not anxious or depressed appearing.  CNS: CN 2-12 intact, power,  normal throughout.no focal deficits noted.   Assessment & Plan   Essential  hypertension Controlled, no change in medication DASH diet and commitment to daily physical activity for a minimum of 30 minutes discussed and encouraged, as a part of hypertension management. The importance of attaining a healthy weight is also discussed.  BP/Weight 09/14/2015 03/30/2015 11/08/2014 10/25/2014 08/04/2014 06/15/2014 05/05/2014  Systolic BP 130 138 124 142 130 138 128  Diastolic BP 80 80 74 88 78 78 76  Wt. (Lbs) 204.8 200.12 202 207 210 - 203  BMI 40 39.08 39.45 40.43 41.01 - 39.65        Pulmonary hypertension Asymptomatic, stable and controlled on medication, followed by plmonary  IGT (impaired glucose tolerance) Patient educated about the importance of limiting  Carbohydrate intake , the need to commit to daily physical activity for a minimum of 30 minutes , and to commit weight loss. The fact that changes in all these areas will reduce or eliminate all together the development of diabetes is stressed.  Updated lab needed at/ before next visit.   Diabetic Labs Latest Ref Rng 05/25/2015 11/08/2014 04/29/2014 12/16/2013 09/15/2013  HbA1c <5.7 % 6.0(H) 6.0(H) 6.0(H) 6.0(H) 5.8(H)  Chol 125 - 200 mg/dL 782 956 213 086 578(I)  HDL >=46 mg/dL 75 89 86 80 74  Calc LDL <130 mg/dL 75 91 86 95 696(E)  Triglycerides <150 mg/dL 73 61 48 40 88  Creatinine 0.50 - 0.99 mg/dL 9.52 8.41 3.24 4.01 0.27   BP/Weight 09/14/2015 03/30/2015 11/08/2014 10/25/2014 08/04/2014 06/15/2014 05/05/2014  Systolic BP 130 138 124 142 130 138 128  Diastolic BP  80 80 74 88 78 78 76  Wt. (Lbs) 204.8 200.12 202 207 210 - 203  BMI 40 39.08 39.45 40.43 41.01 - 39.65   Foot/eye exam completion dates 03/13/2010  Foot exam Order yes  Foot Form Completion -       GERD Controlled, no change in medication   Anxiety and depression Controlled, no change in medication   Hyperlipidemia LDL goal <100 Hyperlipidemia:Low fat diet discussed and encouraged.   Lipid Panel  Lab Results  Component Value Date    CHOL 165 05/25/2015   HDL 75 05/25/2015   LDLCALC 75 05/25/2015   TRIG 73 05/25/2015   CHOLHDL 2.2 05/25/2015   Updated lab needed at/ before next visit. Controlled, no change in medication      Morbid obesity with BMI of 40.0-44.9, adult (HCC) Deteriorated. Patient re-educated about  the importance of commitment to a  minimum of 150 minutes of exercise per week.  The importance of healthy food choices with portion control discussed. Encouraged to start a food diary, count calories and to consider  joining a support group. Sample diet sheets offered. Goals set by the patient for the next several months.   Weight /BMI 09/14/2015 03/30/2015 11/08/2014  WEIGHT 204 lb 12.8 oz 200 lb 1.9 oz 202 lb  HEIGHT     BMI 40 kg/m2 39.08 kg/m2 39.45 kg/m2    Current exercise per week 150 minutes.   Need for 23-polyvalent pneumococcal polysaccharide vaccine After obtaining informed consent, the vaccine is  administered by LPN.      Review of Systems     Objective:   Physical Exam        Assessment & Plan:

## 2015-09-15 DIAGNOSIS — Z23 Encounter for immunization: Secondary | ICD-10-CM | POA: Insufficient documentation

## 2015-09-15 NOTE — Assessment & Plan Note (Signed)
Hyperlipidemia:Low fat diet discussed and encouraged.   Lipid Panel  Lab Results  Component Value Date   CHOL 165 05/25/2015   HDL 75 05/25/2015   LDLCALC 75 05/25/2015   TRIG 73 05/25/2015   CHOLHDL 2.2 05/25/2015   Updated lab needed at/ before next visit. Controlled, no change in medication

## 2015-09-15 NOTE — Assessment & Plan Note (Signed)
Deteriorated. Patient re-educated about  the importance of commitment to a  minimum of 150 minutes of exercise per week.  The importance of healthy food choices with portion control discussed. Encouraged to start a food diary, count calories and to consider  joining a support group. Sample diet sheets offered. Goals set by the patient for the next several months.   Weight /BMI 09/14/2015 03/30/2015 11/08/2014  WEIGHT 204 lb 12.8 oz 200 lb 1.9 oz 202 lb  HEIGHT     BMI 40 kg/m2 39.08 kg/m2 39.45 kg/m2    Current exercise per week 150 minutes.

## 2015-09-15 NOTE — Assessment & Plan Note (Signed)
Asymptomatic, stable and controlled on medication, followed by plmonary

## 2015-09-15 NOTE — Assessment & Plan Note (Signed)
Patient educated about the importance of limiting  Carbohydrate intake , the need to commit to daily physical activity for a minimum of 30 minutes , and to commit weight loss. The fact that changes in all these areas will reduce or eliminate all together the development of diabetes is stressed.  Updated lab needed at/ before next visit.   Diabetic Labs Latest Ref Rng 05/25/2015 11/08/2014 04/29/2014 12/16/2013 09/15/2013  HbA1c <5.7 % 6.0(H) 6.0(H) 6.0(H) 6.0(H) 5.8(H)  Chol 125 - 200 mg/dL 161 096 045 409 811(B)  HDL >=46 mg/dL 75 89 86 80 74  Calc LDL <130 mg/dL 75 91 86 95 147(W)  Triglycerides <150 mg/dL 73 61 48 40 88  Creatinine 0.50 - 0.99 mg/dL 2.95 6.21 3.08 6.57 8.46   BP/Weight 09/14/2015 03/30/2015 11/08/2014 10/25/2014 08/04/2014 06/15/2014 05/05/2014  Systolic BP 130 138 124 142 130 138 128  Diastolic BP 80 80 74 88 78 78 76  Wt. (Lbs) 204.8 200.12 202 207 210 - 203  BMI 40 39.08 39.45 40.43 41.01 - 39.65   Foot/eye exam completion dates 03/13/2010  Foot exam Order yes  Foot Form Completion -

## 2015-09-15 NOTE — Assessment & Plan Note (Signed)
Controlled, no change in medication  

## 2015-09-15 NOTE — Assessment & Plan Note (Signed)
Controlled, no change in medication DASH diet and commitment to daily physical activity for a minimum of 30 minutes discussed and encouraged, as a part of hypertension management. The importance of attaining a healthy weight is also discussed.  BP/Weight 09/14/2015 03/30/2015 11/08/2014 10/25/2014 08/04/2014 06/15/2014 05/05/2014  Systolic BP 130 138 124 142 130 138 128  Diastolic BP 80 80 74 88 78 78 76  Wt. (Lbs) 204.8 200.12 202 207 210 - 203  BMI 40 39.08 39.45 40.43 41.01 - 39.65

## 2015-09-15 NOTE — Assessment & Plan Note (Signed)
After obtaining informed consent, the vaccine is  administered by LPN.  

## 2015-10-03 DIAGNOSIS — J984 Other disorders of lung: Secondary | ICD-10-CM | POA: Diagnosis not present

## 2015-11-01 DIAGNOSIS — J309 Allergic rhinitis, unspecified: Secondary | ICD-10-CM | POA: Diagnosis not present

## 2015-11-01 DIAGNOSIS — I10 Essential (primary) hypertension: Secondary | ICD-10-CM | POA: Diagnosis not present

## 2015-11-01 DIAGNOSIS — I270 Primary pulmonary hypertension: Secondary | ICD-10-CM | POA: Diagnosis not present

## 2015-11-03 DIAGNOSIS — J984 Other disorders of lung: Secondary | ICD-10-CM | POA: Diagnosis not present

## 2015-11-20 DIAGNOSIS — R7302 Impaired glucose tolerance (oral): Secondary | ICD-10-CM | POA: Diagnosis not present

## 2015-11-20 DIAGNOSIS — I10 Essential (primary) hypertension: Secondary | ICD-10-CM | POA: Diagnosis not present

## 2015-11-20 DIAGNOSIS — E559 Vitamin D deficiency, unspecified: Secondary | ICD-10-CM | POA: Diagnosis not present

## 2015-11-21 LAB — COMPLETE METABOLIC PANEL WITH GFR
ALBUMIN: 3.6 g/dL (ref 3.6–5.1)
ALK PHOS: 66 U/L (ref 33–130)
ALT: 11 U/L (ref 6–29)
AST: 18 U/L (ref 10–35)
BUN: 9 mg/dL (ref 7–25)
CALCIUM: 9.1 mg/dL (ref 8.6–10.4)
CO2: 27 mmol/L (ref 20–31)
Chloride: 105 mmol/L (ref 98–110)
Creat: 0.67 mg/dL (ref 0.50–0.99)
GFR, Est African American: >89 mL/min (ref >=60)
Glucose, Bld: 94 mg/dL (ref 65–99)
POTASSIUM: 3.9 mmol/L (ref 3.5–5.3)
Sodium: 143 mmol/L (ref 135–146)
Total Bilirubin: 0.7 mg/dL (ref 0.2–1.2)
Total Protein: 6.3 g/dL (ref 6.1–8.1)

## 2015-11-21 LAB — HEMOGLOBIN A1C
Hgb A1c MFr Bld: 5.8 % — ABNORMAL HIGH (ref <5.7)
Mean Plasma Glucose: 120 mg/dL

## 2015-11-21 LAB — CBC WITH DIFFERENTIAL/PLATELET
BASOS ABS: 0 {cells}/uL (ref 0–200)
Basophils Relative: 0 %
EOS PCT: 2 %
Eosinophils Absolute: 150 cells/uL (ref 15–500)
HCT: 40.9 % (ref 35.0–45.0)
Hemoglobin: 13.2 g/dL (ref 11.7–15.5)
LYMPHS PCT: 40 %
Lymphs Abs: 3000 cells/uL (ref 850–3900)
MCH: 28.9 pg (ref 27.0–33.0)
MCHC: 32.3 g/dL (ref 32.0–36.0)
MCV: 89.7 fL (ref 80.0–100.0)
MONOS PCT: 5 %
MPV: 11.7 fL (ref 7.5–12.5)
Monocytes Absolute: 375 cells/uL (ref 200–950)
NEUTROS ABS: 3975 {cells}/uL (ref 1500–7800)
NEUTROS PCT: 53 %
PLATELETS: 204 10*3/uL (ref 140–400)
RBC: 4.56 MIL/uL (ref 3.80–5.10)
RDW: 14.3 % (ref 11.0–15.0)
WBC: 7.5 10*3/uL (ref 3.8–10.8)

## 2015-11-21 LAB — TSH: TSH: 2.47 m[IU]/L

## 2015-11-21 LAB — VITAMIN D 25 HYDROXY (VIT D DEFICIENCY, FRACTURES): VIT D 25 HYDROXY: 22 ng/mL — AB (ref 30–100)

## 2015-11-22 ENCOUNTER — Other Ambulatory Visit: Payer: Self-pay

## 2015-11-22 ENCOUNTER — Other Ambulatory Visit (HOSPITAL_COMMUNITY)
Admission: RE | Admit: 2015-11-22 | Discharge: 2015-11-22 | Disposition: A | Discharge status: Home / Self Care | Payer: Medicare Other | Source: Ambulatory Visit | Attending: Family Medicine | Admitting: Family Medicine

## 2015-11-22 ENCOUNTER — Ambulatory Visit (INDEPENDENT_AMBULATORY_CARE_PROVIDER_SITE_OTHER): Payer: Medicare Other | Admitting: Family Medicine

## 2015-11-22 ENCOUNTER — Encounter: Payer: Self-pay | Admitting: Family Medicine

## 2015-11-22 VITALS — BP 132/76 | HR 76 | Resp 18 | Ht 60.0 in | Wt 199.0 lb

## 2015-11-22 DIAGNOSIS — Z1211 Encounter for screening for malignant neoplasm of colon: Secondary | ICD-10-CM | POA: Diagnosis not present

## 2015-11-22 DIAGNOSIS — Z0000 Encounter for general adult medical examination without abnormal findings: Secondary | ICD-10-CM

## 2015-11-22 DIAGNOSIS — Z1231 Encounter for screening mammogram for malignant neoplasm of breast: Secondary | ICD-10-CM

## 2015-11-22 DIAGNOSIS — R7302 Impaired glucose tolerance (oral): Secondary | ICD-10-CM

## 2015-11-22 DIAGNOSIS — E559 Vitamin D deficiency, unspecified: Secondary | ICD-10-CM | POA: Diagnosis not present

## 2015-11-22 DIAGNOSIS — R7301 Impaired fasting glucose: Secondary | ICD-10-CM

## 2015-11-22 DIAGNOSIS — Z0001 Encounter for general adult medical examination with abnormal findings: Secondary | ICD-10-CM | POA: Insufficient documentation

## 2015-11-22 DIAGNOSIS — I10 Essential (primary) hypertension: Secondary | ICD-10-CM

## 2015-11-22 DIAGNOSIS — N760 Acute vaginitis: Secondary | ICD-10-CM

## 2015-11-22 DIAGNOSIS — E785 Hyperlipidemia, unspecified: Secondary | ICD-10-CM

## 2015-11-22 LAB — HEMOCCULT GUIAC POC 1CARD (OFFICE): FECAL OCCULT BLD: NEGATIVE

## 2015-11-22 MED ORDER — ERGOCALCIFEROL 1.25 MG (50000 UT) PO CAPS: 50000.0000 [IU] | ORAL_CAPSULE | ORAL | Status: DC

## 2015-11-22 MED ORDER — ALENDRONATE SODIUM 70 MG PO TABS: ORAL_TABLET | ORAL | Status: DC

## 2015-11-22 MED ORDER — ALBUTEROL SULFATE HFA 108 (90 BASE) MCG/ACT IN AERS: INHALATION_SPRAY | RESPIRATORY_TRACT | Status: DC

## 2015-11-22 MED ORDER — METOPROLOL SUCCINATE ER 50 MG PO TB24: ORAL_TABLET | ORAL | Status: DC

## 2015-11-22 MED ORDER — GABAPENTIN 300 MG PO CAPS: 300.0000 mg | ORAL_CAPSULE | Freq: Every day | ORAL | Status: DC

## 2015-11-22 MED ORDER — SERTRALINE HCL 100 MG PO TABS: 100.0000 mg | ORAL_TABLET | Freq: Every day | ORAL | Status: DC

## 2015-11-22 MED ORDER — OMEPRAZOLE 20 MG PO CPDR: 20.0000 mg | DELAYED_RELEASE_CAPSULE | Freq: Every day | ORAL | Status: DC

## 2015-11-22 MED ORDER — LOSARTAN POTASSIUM-HCTZ 50-12.5 MG PO TABS: 1.0000 | ORAL_TABLET | Freq: Every day | ORAL | Status: DC

## 2015-11-22 MED ORDER — ATORVASTATIN CALCIUM 20 MG PO TABS: ORAL_TABLET | ORAL | Status: DC

## 2015-11-22 NOTE — Addendum Note (Signed)
Addended by: Abner GreenspanHUDY, Norissa Bartee H on: 11/22/2015 01:56 PM   Modules accepted: Orders

## 2015-11-22 NOTE — Assessment & Plan Note (Signed)
Annual exam as documented. . Immunization and cancer screening needs are specifically addressed at this visit.  

## 2015-11-22 NOTE — Progress Notes (Signed)
   Subjective:    Patient ID: Mallory GrammesDiana M Zook, female    DOB: Oct 16, 1945, 70 y.o.   MRN: [id number]  HPI Patient is in for annual physical exam. 1 day h/o malodorous vaginal discharge, not sexually active and does not douche Recent labs, if available are reviewed. Immunization is reviewed , and  updated if needed.    Review of Systems See HPI     Objective:   Physical Exam  BP 132/76 mmHg  Pulse 76  Resp 18  Ht 5' (1.524 m)  Wt 199 lb (90.266 kg)  BMI 38.86 kg/m2  SpO2 98% Pleasant well nourished female, alert and oriented x 3, in no cardio-pulmonary distress. Afebrile. HEENT No facial trauma or asymetry. Sinuses non tender.  Extra occullar muscles intact, pupils equally reactive to light. External ears normal, tympanic membranes clear. Oropharynx moist, no exudate, poor dentition. Neck: supple, no adenopathy,JVD or thyromegaly.No bruits.  Chest: Clear to ascultation bilaterally.No crackles or wheezes. Non tender to palpation  Breast: No asymetry,no masses or lumps. No tenderness. No nipple discharge or inversion. No axillary or supraclavicular adenopathy  Cardiovascular system; Heart sounds normal,  S1 and  S2 ,no S3.  No murmur, or thrill. Apical beat not displaced Peripheral pulses normal.  Abdomen: Soft, non tender, no organomegaly or masses. No bruits. Bowel sounds normal. No guarding, tenderness or rebound.  Rectal:  Normal sphincter tone. No mass.No rectal masses.  Guaiac negative stool.  GU: External genitalia normal female genitalia , female distribution of hair. No lesions. Urethral meatus normal in size, no  Prolapse, no lesions visibly  Present. Bladder non tender. Vagina pink and moist , with no visible lesions ,scant non malodorous, physiologic  discharge present . Adequate pelvic support no  cystocele or rectocele noted Uterus absent, no adnexal masses, no  adnexal tenderness.   Musculoskeletal exam: Decreased though adequate  ROM of  spine, hips , shoulders and knees. No deformity ,swelling or crepitus noted. No muscle wasting or atrophy.   Neurologic: Cranial nerves 2 to 12 intact. Power, tone ,sensation and reflexes normal throughout. No disturbance in gait. No tremor.  Skin: Intact, no ulceration, erythema , scaling or rash noted. Pigmentation normal throughout  Psych; Normal mood and affect. Judgement and concentration normal       Assessment & Plan:  Annual physical exam Annual exam as documented.   Immunization and cancer screening needs are specifically addressed at this visit.   Vaginitis and vulvovaginitis Symptomatic x 1 day, specimens sent for testing , will treat when results are available

## 2015-11-22 NOTE — Patient Instructions (Addendum)
Annual wellness in 5.5  month, call if you need me soooner  Excellent labs, new is once weekly vit D  Fasting lipid, cmp and eGFr and HBA1c and Vit D in 5. 5 month  Continue to stay off of sodas, and eat fresh or frozen fruit and try to cut back on fruit juice, Water is healthiest drink  Thanks for choosing [facility], we consider it a privelige to serve you.

## 2015-11-22 NOTE — Assessment & Plan Note (Signed)
Symptomatic x 1 day, specimens sent for testing , will treat when results are available

## 2015-11-23 LAB — CERVICOVAGINAL ANCILLARY ONLY: WET PREP (BD AFFIRM): NEGATIVE

## 2015-12-03 DIAGNOSIS — J984 Other disorders of lung: Secondary | ICD-10-CM | POA: Diagnosis not present

## 2016-01-03 DIAGNOSIS — J984 Other disorders of lung: Secondary | ICD-10-CM | POA: Diagnosis not present

## 2016-02-02 DIAGNOSIS — J984 Other disorders of lung: Secondary | ICD-10-CM | POA: Diagnosis not present

## 2016-02-14 ENCOUNTER — Telehealth: Payer: Self-pay | Admitting: Family Medicine

## 2016-02-14 NOTE — Telephone Encounter (Signed)
ERROR

## 2016-02-15 ENCOUNTER — Ambulatory Visit (HOSPITAL_COMMUNITY)
Admission: RE | Admit: 2016-02-15 | Discharge: 2016-02-15 | Disposition: A | Discharge status: Home / Self Care | Payer: Medicare Other | Source: Ambulatory Visit | Attending: Family Medicine | Admitting: Family Medicine

## 2016-02-15 ENCOUNTER — Ambulatory Visit (INDEPENDENT_AMBULATORY_CARE_PROVIDER_SITE_OTHER): Payer: Medicare Other | Admitting: Family Medicine

## 2016-02-15 VITALS — BP 118/82 | HR 73 | Resp 16 | Ht 60.0 in | Wt 196.0 lb

## 2016-02-15 DIAGNOSIS — M25572 Pain in left ankle and joints of left foot: Secondary | ICD-10-CM | POA: Diagnosis not present

## 2016-02-15 DIAGNOSIS — F419 Anxiety disorder, unspecified: Secondary | ICD-10-CM

## 2016-02-15 DIAGNOSIS — I10 Essential (primary) hypertension: Secondary | ICD-10-CM

## 2016-02-15 DIAGNOSIS — S92515A Nondisplaced fracture of proximal phalanx of left lesser toe(s), initial encounter for closed fracture: Secondary | ICD-10-CM | POA: Insufficient documentation

## 2016-02-15 DIAGNOSIS — S92512A Displaced fracture of proximal phalanx of left lesser toe(s), initial encounter for closed fracture: Secondary | ICD-10-CM | POA: Diagnosis not present

## 2016-02-15 DIAGNOSIS — F329 Major depressive disorder, single episode, unspecified: Secondary | ICD-10-CM

## 2016-02-15 DIAGNOSIS — M79672 Pain in left foot: Secondary | ICD-10-CM | POA: Diagnosis not present

## 2016-02-15 DIAGNOSIS — M79675 Pain in left toe(s): Secondary | ICD-10-CM

## 2016-02-15 DIAGNOSIS — X58XXXA Exposure to other specified factors, initial encounter: Secondary | ICD-10-CM | POA: Insufficient documentation

## 2016-02-15 DIAGNOSIS — F418 Other specified anxiety disorders: Secondary | ICD-10-CM

## 2016-02-15 DIAGNOSIS — F32A Depression, unspecified: Secondary | ICD-10-CM

## 2016-02-15 MED ORDER — DICLOFENAC SODIUM 75 MG PO TBEC: 75.0000 mg | DELAYED_RELEASE_TABLET | Freq: Two times a day (BID) | ORAL | 0 refills | Status: AC

## 2016-02-15 NOTE — Assessment & Plan Note (Signed)
Trauma to left little toe x 3 in past week, twice at [facility], and once at home or more, xray of foot and also anti inflammatory for pain and use of closed shoe

## 2016-02-15 NOTE — Patient Instructions (Signed)
F/u as before, call if you need me sooner  X ray of left foot today, and 1 week of anti inflammatory sent in.  Pls wear closed shoes  I n the home to reduce trauma to foot

## 2016-02-18 ENCOUNTER — Encounter: Payer: Self-pay | Admitting: Family Medicine

## 2016-02-18 NOTE — Progress Notes (Signed)
   [name]     MRN: [id number]      DOB: 1945-10-07   HPI [name] is here with a c/o left 5th toe pain, following repeated trauma at least 3 times in the past 1 week, she notes swelling and discoloration and pain in the area , esp with direct pressure  ROS Denies recent fever or chills. Denies sinus pressure, nasal congestion, ear pain or sore throat. Denies chest congestion, productive cough or wheezing. Denies chest pains, palpitations and leg swelling Denies abdominal pain, nausea, vomiting,diarrhea or constipation.   Denies dysuria, frequency, hesitancy or incontinence.   PE  BP 118/82 (BP Location: Left Arm, Patient Position: Sitting, Cuff Size: Normal)   Pulse 73   Resp 16   Ht 5' (1.524 m)   Wt 196 lb (88.9 kg)   SpO2 99%   BMI 38.28 kg/m   Patient alert and oriented and in no cardiopulmonary distress.  HEENT: No facial asymmetry, EOMI,   oropharynx pink and moist.  Neck supple no JVD, no mass.  Chest: Clear to auscultation bilaterally.  CVS: S1, S2 no murmurs, no S3.Regular rate.  r.   Ext: No edema  MS: Adequate ROM spine, shoulders, hips and knees. Tender over left 5th toe , with bruising and swelling of the foot in the affected area   CNS: CN 2-12 intact, power,  normal throughout.no focal deficits noted.   Assessment & Plan  Foot pain, left Trauma to left little toe x 3 in past week, twice at [facility], and once at home or more, xray of foot and also anti inflammatory for pain and use of closed shoe  Essential hypertension Controlled, no change in medication   Anxiety and depression Controlled, no change in medication

## 2016-02-18 NOTE — Assessment & Plan Note (Signed)
Controlled, no change in medication  

## 2016-03-04 DIAGNOSIS — J984 Other disorders of lung: Secondary | ICD-10-CM | POA: Diagnosis not present

## 2016-03-11 ENCOUNTER — Other Ambulatory Visit: Payer: Self-pay

## 2016-03-11 MED ORDER — GABAPENTIN 300 MG PO CAPS: 300.0000 mg | ORAL_CAPSULE | Freq: Every day | ORAL | 0 refills | Status: DC

## 2016-03-11 MED ORDER — OMEPRAZOLE 20 MG PO CPDR: 20.0000 mg | DELAYED_RELEASE_CAPSULE | Freq: Every day | ORAL | 0 refills | Status: DC

## 2016-03-11 MED ORDER — SERTRALINE HCL 100 MG PO TABS: 100.0000 mg | ORAL_TABLET | Freq: Every day | ORAL | 0 refills | Status: DC

## 2016-03-11 MED ORDER — METOPROLOL SUCCINATE ER 50 MG PO TB24: ORAL_TABLET | ORAL | 0 refills | Status: DC

## 2016-03-11 MED ORDER — LOSARTAN POTASSIUM-HCTZ 50-12.5 MG PO TABS: 1.0000 | ORAL_TABLET | Freq: Every day | ORAL | 0 refills | Status: DC

## 2016-04-04 DIAGNOSIS — J984 Other disorders of lung: Secondary | ICD-10-CM | POA: Diagnosis not present

## 2016-04-08 ENCOUNTER — Ambulatory Visit (HOSPITAL_COMMUNITY)
Admission: RE | Admit: 2016-04-08 | Discharge: 2016-04-08 | Disposition: A | Discharge status: Home / Self Care | Payer: Medicare Other | Source: Ambulatory Visit | Attending: Family Medicine | Admitting: Family Medicine

## 2016-04-08 DIAGNOSIS — Z1231 Encounter for screening mammogram for malignant neoplasm of breast: Secondary | ICD-10-CM | POA: Diagnosis not present

## 2016-05-04 DIAGNOSIS — J984 Other disorders of lung: Secondary | ICD-10-CM | POA: Diagnosis not present

## 2016-05-08 ENCOUNTER — Ambulatory Visit (INDEPENDENT_AMBULATORY_CARE_PROVIDER_SITE_OTHER): Payer: Medicare Other

## 2016-05-08 VITALS — BP 126/84 | HR 76 | Resp 18 | Ht 60.0 in | Wt 192.0 lb

## 2016-05-08 DIAGNOSIS — Z0000 Encounter for general adult medical examination without abnormal findings: Secondary | ICD-10-CM | POA: Diagnosis not present

## 2016-05-08 DIAGNOSIS — M810 Age-related osteoporosis without current pathological fracture: Secondary | ICD-10-CM

## 2016-05-08 DIAGNOSIS — Z09 Encounter for follow-up examination after completed treatment for conditions other than malignant neoplasm: Secondary | ICD-10-CM

## 2016-05-08 NOTE — Patient Instructions (Signed)
Thank you for choosing [facility] for your health care needs  The Annual Wellness Visit is designed to allow us the chance to assist you in preserving and improving you health.   Dr. Lodema HongSimpson will see you back in 4 months  Please have blood work done before the end of the month.   If you have any questions or concerns feel free to contact the office.

## 2016-05-09 NOTE — Progress Notes (Signed)
Subjective:    [name] is a 70 y.o. female who presents for Medicare Annual/Subsequent preventive examination.  Preventive Screening-Counseling & Management  Tobacco History  Smoking Status  . Never Smoker  Smokeless Tobacco  . Never Used     Current Problems (verified) Patient Active Problem List   Diagnosis Date Noted  . Vitamin D deficiency 11/22/2015  . GERD (gastroesophageal reflux disease) 09/15/2013  . Back pain with left-sided radiculopathy 09/15/2013  . CTS (carpal tunnel syndrome) 07/31/2011  . Foot pain, left 06/27/2011  . IGT (impaired glucose tolerance) 10/16/2007  . Hyperlipidemia LDL goal <100 10/16/2007  . Morbid obesity with BMI of 40.0-44.9, adult (HCC) 10/16/2007  . Anxiety and depression 10/16/2007  . Essential hypertension 10/16/2007  . Pulmonary hypertension 10/16/2007  . GERD 10/16/2007  . DIVERTICULOSIS OF COLON 10/16/2007  . 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 albuterol (PROAIR HFA) 108 (90 Base) MCG/ACT inhaler INHALE 2 PUFFS INTO THE LUNGS EVERY 6 (SIX) HOURS AS NEEDED FOR WHEEZING OR SHORTNESS OF BREATH. 8.5 Inhaler 5  . alendronate (FOSAMAX) 70 MG tablet TAKE 1 TABLET BY MOUTH EVERY 7 DAYS WITH A FULL GLASS OF WATER ON AN EMPTY STOMACH 4 tablet 5  . aspirin (ASPIRIN LOW DOSE) 81 MG EC tablet Take 81 mg by mouth daily. Take one tablet by mouth once a day     . atorvastatin (LIPITOR) 20 MG tablet TAKE 1 TABLET (20 MG TOTAL) BY MOUTH DAILY. 30 tablet 5  . Calcium Carbonate-Vit D-Min (CALCIUM 600 + MINERALS) 600-200 MG-UNIT TABS Take by mouth 3 (three) times daily. Take one tablet by mouth three times a day     . clotrimazole-betamethasone (LOTRISONE) cream Apply topically 2 (two) times daily. 45 g 1  . ergocalciferol (VITAMIN D2) 50000 units capsule Take 1 capsule (50,000 Units total) by mouth once a week. One  capsule once weekly 4 capsule 6  . gabapentin (NEURONTIN) 300 MG capsule Take 1 capsule (300 mg total) by mouth at bedtime. 90 capsule 0  . losartan-hydrochlorothiazide (HYZAAR) 50-12.5 MG tablet Take 1 tablet by mouth daily. 90 tablet 0  . metoprolol succinate (TOPROL-XL) 50 MG 24 hr tablet TAKE 1 TABLET (50 MG TOTAL) BY MOUTH DAILY. TAKE WITH OR IMMEDIATELY FOLLOWING A MEAL. 90 tablet 0  . Multiple Vitamin (MULTIVITAMIN) tablet Take 1 tablet by mouth daily.    Marland Kitchen. omeprazole (PRILOSEC) 20 MG capsule Take 1 capsule (20 mg total) by mouth daily. 90 capsule 0  . pyridOXINE (VITAMIN B-6) 100 MG tablet Take 200 mg by mouth daily.    . sertraline (ZOLOFT) 100 MG tablet Take 1 tablet (100 mg total) by mouth daily. 90 tablet 0  . sildenafil (REVATIO) 20 MG tablet Take 60 mg by mouth daily.      No current facility-administered medications on file prior to visit.     Current Medications (verified) Current Outpatient Prescriptions  Medication Sig Dispense Refill  . albuterol (PROAIR HFA) 108 (90 Base) MCG/ACT inhaler INHALE 2 PUFFS INTO THE LUNGS EVERY 6 (SIX) HOURS AS NEEDED FOR WHEEZING OR SHORTNESS OF BREATH. 8.5 Inhaler 5  . alendronate (FOSAMAX) 70 MG tablet TAKE 1 TABLET BY MOUTH EVERY 7 DAYS WITH A FULL GLASS OF WATER ON AN EMPTY STOMACH 4 tablet 5  . aspirin (ASPIRIN LOW DOSE) 81 MG EC tablet Take 81 mg by mouth daily. Take one tablet by mouth  once a day     . atorvastatin (LIPITOR) 20 MG tablet TAKE 1 TABLET (20 MG TOTAL) BY MOUTH DAILY. 30 tablet 5  . Calcium Carbonate-Vit D-Min (CALCIUM 600 + MINERALS) 600-200 MG-UNIT TABS Take by mouth 3 (three) times daily. Take one tablet by mouth three times a day     . clotrimazole-betamethasone (LOTRISONE) cream Apply topically 2 (two) times daily. 45 g 1  . ergocalciferol (VITAMIN D2) 50000 units capsule Take 1 capsule (50,000 Units total) by mouth once a week. One capsule once weekly 4 capsule 6  . gabapentin (NEURONTIN) 300 MG capsule Take 1 capsule  (300 mg total) by mouth at bedtime. 90 capsule 0  . losartan-hydrochlorothiazide (HYZAAR) 50-12.5 MG tablet Take 1 tablet by mouth daily. 90 tablet 0  . metoprolol succinate (TOPROL-XL) 50 MG 24 hr tablet TAKE 1 TABLET (50 MG TOTAL) BY MOUTH DAILY. TAKE WITH OR IMMEDIATELY FOLLOWING A MEAL. 90 tablet 0  . Multiple Vitamin (MULTIVITAMIN) tablet Take 1 tablet by mouth daily.    Marland Kitchen omeprazole (PRILOSEC) 20 MG capsule Take 1 capsule (20 mg total) by mouth daily. 90 capsule 0  . pyridOXINE (VITAMIN B-6) 100 MG tablet Take 200 mg by mouth daily.    . sertraline (ZOLOFT) 100 MG tablet Take 1 tablet (100 mg total) by mouth daily. 90 tablet 0  . sildenafil (REVATIO) 20 MG tablet Take 60 mg by mouth daily.      No current facility-administered medications for this visit.      Allergies (verified) Review of patient's allergies indicates no known allergies.   PAST HISTORY  Family History Family History  Problem Relation Age of Onset  . Aneurysm Father   . Diabetes Father   . Diabetes Mother   . Hypertension Mother   . Heart disease Mother   . Hypertension Brother   . Diabetes Brother   . Diabetes Brother   . Hypertension Brother   . Diabetes Brother   . Hypertension Brother   . Hypertension Brother   . Colon cancer Maternal Uncle     Social History Social History  Substance Use Topics  . Smoking status: Never Smoker  . Smokeless tobacco: Never Used  . Alcohol use No     Are there smokers in your home (other than you)? No  Risk Factors Current exercise habits: The patient does not participate in regular exercise at present.  Dietary issues discussed: Portion sizes, upcoming changes with food labels    Cardiac risk factors: advanced age (older than 23 for men, 38 for women), dyslipidemia, hypertension, obesity (BMI >= 30 kg/m2) and sedentary lifestyle.  Depression Screen (Note: if answer to either of the following is "Yes", a more complete depression screening is indicated)    Over the past two weeks, have you felt down, depressed or hopeless? No  Over the past two weeks, have you felt little interest or pleasure in doing things? No  Have you lost interest or pleasure in daily life? No  Do you often feel hopeless? No  Do you cry easily over simple problems? No  Activities of Daily Living In your present state of health, do you have any difficulty performing the following activities?:  Driving? No Managing money?  No Feeding yourself? No Getting from bed to chair? No  Climbing a flight of stairs? Yes  Preparing food and eating?: No Bathing or showering? No Getting dressed: No Getting to the toilet? No Using the toilet:No Moving around from place to place: No In  the past year have you fallen or had a near fall?:No   Are you sexually active?  Yes  Do you have more than one partner?  No  Hearing Difficulties: No Do you often ask people to speak up or repeat themselves? No Do you experience ringing or noises in your ears? No Do you have difficulty understanding soft or whispered voices? No   Do you feel that you have a problem with memory? No  Do you often misplace items? No  Do you feel safe at home?  Yes  Cognitive Testing  Alert? Yes  Normal Appearance?Yes  Oriented to person? Yes  Place? Yes   Time? Yes  Recall of three objects?  Yes  Can perform simple calculations? Yes  Displays appropriate judgment?Yes  Can read the correct time from a watch face?Yes   Advanced Directives have been discussed with the patient? Yes  List the Names of Other Physician/Practitioners you currently use: 1.  Dr. Darrick Penna (GI)    Indicate any recent Medical Services you may have received from other than [facility] providers in the past year (date may be approximate).  Immunization History  Administered Date(s) Administered  . H1N1 05/23/2008  . Influenza Split 04/02/2012, 04/21/2014, 03/05/2016  . Influenza Whole 04/08/2007, 05/01/2009, 04/03/2010  .  Influenza,inj,Quad PF,36+ Mos 03/30/2015  . Pneumococcal Conjugate-13 08/04/2014  . Pneumococcal Polysaccharide-23 01/16/2004, 06/12/2010, 09/14/2015  . Td 01/16/2004  . Zoster 07/20/2008    Screening Tests Health Maintenance  Topic Date Due  . TETANUS/TDAP  06/12/2016 (Originally 01/15/2014)  . MAMMOGRAM  04/08/2018  . COLONOSCOPY  11/17/2023  . INFLUENZA VACCINE  Completed  . DEXA SCAN  Completed  . ZOSTAVAX  Completed  . Hepatitis C Screening  Completed  . PNA vac Low Risk Adult  Completed    All answers were reviewed with the patient and necessary referrals were made:  Durwin Nora, LPN   45/40/9811   History reviewed: allergies, current medications, past family history, past medical history, past social history, past surgical history and problem list  Review of Systems A comprehensive review of systems was negative.    Objective:     Vision by Snellen chart: right eye:20/30, left eye:20/25  Body mass index is 37.5 kg/m. BP 126/84   Pulse 76   Resp 18   Ht 5' (1.524 m)   Wt 192 lb (87.1 kg)   SpO2 98%   BMI 37.50 kg/m   No exam performed today, annual wellness without physical exam.     Assessment:   Medicare annual wellness visit, subsequent Annual exam as documented. Counseling done  re healthy lifestyle involving commitment to 150 minutes exercise per week, heart healthy diet, and attaining healthy weight.The importance of adequate sleep also discussed. Regular seat belt use and home safety, is also discussed. Changes in health habits are decided on by the patient with goals and time frames  set for achieving them. Immunization and cancer screening needs are specifically addressed at this visit.     Plan:     During the course of the visit the patient was educated and counseled about appropriate screening and preventive services including:    Bone density scan  Diet review for nutrition referral? Yes ____  Not Indicated  __x__   Patient Instructions (the written plan) was given to the patient.  Medicare Attestation I have personally reviewed: The patient's medical and social history Their use of alcohol, tobacco or illicit drugs Their current medications and supplements The patient's functional  ability including ADLs,fall risks, home safety risks, cognitive, and hearing and visual impairment Diet and physical activities Evidence for depression or mood disorders  The patient's weight, height, BMI, and visual acuity have been recorded in the chart.  I have made referrals, counseling, and provided education to the patient based on review of the above and I have provided the patient with a written personalized care plan for preventive services.     Kandis Fantasia [facility], [place]   16/04/9603

## 2016-05-14 DIAGNOSIS — E559 Vitamin D deficiency, unspecified: Secondary | ICD-10-CM | POA: Diagnosis not present

## 2016-05-14 DIAGNOSIS — R7301 Impaired fasting glucose: Secondary | ICD-10-CM | POA: Diagnosis not present

## 2016-05-14 DIAGNOSIS — E785 Hyperlipidemia, unspecified: Secondary | ICD-10-CM | POA: Diagnosis not present

## 2016-05-14 NOTE — Assessment & Plan Note (Signed)

## 2016-05-15 LAB — HEMOGLOBIN A1C
HEMOGLOBIN A1C: 5.5 % (ref <5.7)
MEAN PLASMA GLUCOSE: 111 mg/dL

## 2016-05-15 LAB — COMPLETE METABOLIC PANEL WITH GFR
ALT: 11 U/L (ref 6–29)
AST: 18 U/L (ref 10–35)
Albumin: 3.8 g/dL (ref 3.6–5.1)
Alkaline Phosphatase: 64 U/L (ref 33–130)
BUN: 12 mg/dL (ref 7–25)
CALCIUM: 8.7 mg/dL (ref 8.6–10.4)
CHLORIDE: 107 mmol/L (ref 98–110)
CO2: 27 mmol/L (ref 20–31)
Creat: 0.68 mg/dL (ref 0.50–0.99)
Glucose, Bld: 86 mg/dL (ref 65–99)
POTASSIUM: 3.5 mmol/L (ref 3.5–5.3)
Sodium: 143 mmol/L (ref 135–146)
Total Bilirubin: 0.8 mg/dL (ref 0.2–1.2)
Total Protein: 6.4 g/dL (ref 6.1–8.1)

## 2016-05-15 LAB — LIPID PANEL
CHOL/HDL RATIO: 2.6 ratio (ref <=5.0)
CHOLESTEROL: 198 mg/dL (ref 125–200)
HDL: 75 mg/dL (ref >=46)
LDL Cholesterol: 110 mg/dL (ref <130)
Triglycerides: 64 mg/dL (ref <150)
VLDL: 13 mg/dL (ref <30)

## 2016-05-15 LAB — VITAMIN D 25 HYDROXY (VIT D DEFICIENCY, FRACTURES): VIT D 25 HYDROXY: 28 ng/mL — AB (ref 30–100)

## 2016-05-17 ENCOUNTER — Other Ambulatory Visit: Payer: Self-pay

## 2016-05-17 DIAGNOSIS — E559 Vitamin D deficiency, unspecified: Secondary | ICD-10-CM

## 2016-05-17 MED ORDER — ERGOCALCIFEROL 1.25 MG (50000 UT) PO CAPS: 50000.0000 [IU] | ORAL_CAPSULE | ORAL | 6 refills | Status: DC

## 2016-06-04 DIAGNOSIS — J984 Other disorders of lung: Secondary | ICD-10-CM | POA: Diagnosis not present

## 2016-07-04 DIAGNOSIS — J984 Other disorders of lung: Secondary | ICD-10-CM | POA: Diagnosis not present

## 2016-08-04 DIAGNOSIS — J984 Other disorders of lung: Secondary | ICD-10-CM | POA: Diagnosis not present

## 2016-08-08 ENCOUNTER — Other Ambulatory Visit: Payer: Self-pay | Admitting: Family Medicine

## 2016-08-27 ENCOUNTER — Telehealth: Payer: Self-pay | Admitting: Orthopedic Surgery

## 2016-08-27 NOTE — Telephone Encounter (Signed)
Question from patient - is she to have pre-anti-biotics? 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  Her Dental appointment is tomorrow, 08/28/16. Pharmacy is [facility], [place], please advise. Ph# [phone] (Home) *Preferred*

## 2016-08-28 ENCOUNTER — Other Ambulatory Visit: Payer: Self-pay | Admitting: Family Medicine

## 2016-08-28 ENCOUNTER — Other Ambulatory Visit: Payer: Self-pay | Admitting: *Deleted

## 2016-08-28 MED ORDER — CEPHALEXIN 500 MG PO CAPS: ORAL_CAPSULE | ORAL | 3 refills | Status: DC

## 2016-08-28 NOTE — Progress Notes (Signed)
kef

## 2016-08-28 NOTE — Telephone Encounter (Signed)
Predental medication sent to pharmacy

## 2016-09-04 DIAGNOSIS — J984 Other disorders of lung: Secondary | ICD-10-CM | POA: Diagnosis not present

## 2016-09-16 ENCOUNTER — Encounter: Payer: Self-pay | Admitting: Family Medicine

## 2016-09-16 ENCOUNTER — Ambulatory Visit: Payer: Medicare Other | Admitting: Family Medicine

## 2016-10-02 ENCOUNTER — Encounter: Payer: Self-pay | Admitting: Family Medicine

## 2016-10-02 ENCOUNTER — Ambulatory Visit (INDEPENDENT_AMBULATORY_CARE_PROVIDER_SITE_OTHER): Payer: Medicare Other | Admitting: Family Medicine

## 2016-10-02 VITALS — BP 160/90 | HR 73 | Resp 15 | Ht 60.0 in | Wt 191.8 lb

## 2016-10-02 DIAGNOSIS — E669 Obesity, unspecified: Secondary | ICD-10-CM

## 2016-10-02 DIAGNOSIS — F329 Major depressive disorder, single episode, unspecified: Secondary | ICD-10-CM

## 2016-10-02 DIAGNOSIS — J984 Other disorders of lung: Secondary | ICD-10-CM | POA: Diagnosis not present

## 2016-10-02 DIAGNOSIS — Z780 Asymptomatic menopausal state: Secondary | ICD-10-CM | POA: Diagnosis not present

## 2016-10-02 DIAGNOSIS — R7302 Impaired glucose tolerance (oral): Secondary | ICD-10-CM

## 2016-10-02 DIAGNOSIS — K219 Gastro-esophageal reflux disease without esophagitis: Secondary | ICD-10-CM

## 2016-10-02 DIAGNOSIS — I10 Essential (primary) hypertension: Secondary | ICD-10-CM | POA: Diagnosis not present

## 2016-10-02 DIAGNOSIS — I272 Pulmonary hypertension, unspecified: Secondary | ICD-10-CM | POA: Diagnosis not present

## 2016-10-02 DIAGNOSIS — E559 Vitamin D deficiency, unspecified: Secondary | ICD-10-CM

## 2016-10-02 DIAGNOSIS — F419 Anxiety disorder, unspecified: Secondary | ICD-10-CM

## 2016-10-02 DIAGNOSIS — F418 Other specified anxiety disorders: Secondary | ICD-10-CM | POA: Diagnosis not present

## 2016-10-02 DIAGNOSIS — E785 Hyperlipidemia, unspecified: Secondary | ICD-10-CM

## 2016-10-02 DIAGNOSIS — Z13820 Encounter for screening for osteoporosis: Secondary | ICD-10-CM

## 2016-10-02 DIAGNOSIS — F32A Depression, unspecified: Secondary | ICD-10-CM

## 2016-10-02 MED ORDER — LOSARTAN POTASSIUM-HCTZ 100-12.5 MG PO TABS: 1.0000 | ORAL_TABLET | Freq: Every day | ORAL | 3 refills | Status: DC

## 2016-10-02 NOTE — Patient Instructions (Signed)
Annual physical exam with pap May 4 or after, call if you need me sooner  Blood pressure is high, new dose of losartan/HCTZ , please collect today and start tomorrow  Looking forward to quilting!  Fasting CBC, lipid, cmp and EGFR, hBA1C, tSH and Vit D on May 1 or after  It is important that you exercise regularly at least 30 minutes 5 times a week. If you develop chest pain, have severe difficulty breathing, or feel very tired, stop exercising immediately and seek medical attention    Please work on good  health habits so that your health will improve. 1. Commitment to daily physical activity for 30 to 60  minutes, if you are able to do this.  2. Commitment to wise food choices. Aim for half of your  food intake to be vegetable and fruit, one quarter starchy foods, and one quarter protein. Try to eat on a regular schedule  3 meals per day, snacking between meals should be limited to vegetables or fruits or small portions of nuts. 64 ounces of water per day is generally recommended, unless you have specific health conditions, like heart failure or kidney failure where you will need to limit fluid intake.  3. Commitment to sufficient and a  good quality of physical and mental rest daily, generally between 6 to 8 hours per day.  WITH PERSISTANCE AND PERSEVERANCE, THE IMPOSSIBLE , BECOMES THE NORM!

## 2016-10-02 NOTE — Assessment & Plan Note (Signed)
Controlled, no change in medication  

## 2016-10-02 NOTE — Progress Notes (Signed)
Mallory GrammesDiana M Wagner     MRN: [id number]      DOB: [birth year]   HPI Ms. Laural Wagner is here for follow up and re-evaluation of chronic medical conditions, medication management and review of any available recent lab and radiology data.  Preventive health is updated, specifically  Cancer screening and Immunization.   Questions or concerns regarding consultations or procedures which the PT has had in the interim are  addressed. The PT denies any adverse reactions to current medications since the last visit.  There are no new concerns.  There are no specific complaints   ROS Denies recent fever or chills. Denies sinus pressure, nasal congestion, ear pain or sore throat. Denies chest congestion, productive cough or wheezing. Denies chest pains, palpitations and leg swelling Denies abdominal pain, nausea, vomiting,diarrhea or constipation.   Denies dysuria, frequency, hesitancy or incontinence. Denies joint pain, swelling and limitation in mobility. Denies headaches, seizures, numbness, or tingling. Denies depression, anxiety or insomnia. Denies skin break down or rash.   PE  BP (!) 160/90   Pulse 73   Resp 15   Ht 5' (1.524 m)   Wt 191 lb 12.8 oz (87 kg)   SpO2 95%   BMI 37.46 kg/m   Patient alert and oriented and in no cardiopulmonary distress.  HEENT: No facial asymmetry, EOMI,   oropharynx pink and moist.  Neck supple no JVD, no mass.  Chest: Clear to auscultation bilaterally.  CVS: S1, S2 no murmurs, no S3.Regular rate.  ABD: Soft non tender.   Ext: No edema  MS: Adequate ROM spine, shoulders, hips and knees.  Skin: Intact, no ulcerations or rash noted.  Psych: Good eye contact, normal affect. Memory intact not anxious or depressed appearing.  CNS: CN 2-12 intact, power,  normal throughout.no focal deficits noted.   Assessment & Plan  Essential hypertension Controlled, no change in medication DASH diet and commitment to daily physical activity for a minimum of 30  minutes discussed and encouraged, as a part of hypertension management. The importance of attaining a healthy weight is also discussed.  BP/Weight 10/02/2016 05/08/2016 02/15/2016 11/22/2015 09/14/2015 03/30/2015 11/08/2014  Systolic BP 160 126 118 132 130 138 124  Diastolic BP 90 84 82 76 80 80 74  Wt. (Lbs) 191.8 192 196 199 204.8 200.12 202  BMI 37.46 37.5 38.28 38.86 40 39.08 39.45       Hyperlipidemia LDL goal <100 Hyperlipidemia:Low fat diet discussed and encouraged.   Lipid Panel  Lab Results  Component Value Date   CHOL 198 05/14/2016   HDL 75 05/14/2016   LDLCALC 110 05/14/2016   TRIG 64 05/14/2016   CHOLHDL 2.6 05/14/2016    Updated lab needed at/ before next visit.    Obesity (BMI 30-39.9) Deteriorated. Patient re-educated about  the importance of commitment to a  minimum of 150 minutes of exercise per week.  The importance of healthy food choices with portion control discussed. Encouraged to start a food diary, count calories and to consider  joining a support group. Sample diet sheets offered. Goals set by the patient for the next several months.   Weight /BMI 10/02/2016 05/08/2016 02/15/2016  WEIGHT 191 lb 12.8 oz 192 lb 196 lb  HEIGHT 5\' 0"  5\' 0"  5\' 0"   BMI 37.46 kg/m2 37.5 kg/m2 38.28 kg/m2      IGT (impaired glucose tolerance) Patient educated about the importance of limiting  Carbohydrate intake , the need to commit to daily physical activity for a minimum of 30  minutes , and to commit weight loss. The fact that changes in all these areas will reduce or eliminate all together the development of diabetes is stressed.  Updated lab needed at/ before next visit.  Diabetic Labs Latest Ref Rng & Units 05/14/2016 11/20/2015 05/25/2015 11/08/2014 04/29/2014  HbA1c <5.7 % 5.5 5.8(H) 6.0(H) 6.0(H) 6.0(H)  Microalbumin 0.00 - 1.89 mg/dL - - - - -  Micro/Creat Ratio 0.0 - 30.0 mg/g - - - - -  Chol 125 - 200 mg/dL 161 - 096 045 409  HDL >=46 mg/dL 75 - 75 89 86  Calc  LDL <130 mg/dL 811 - 75 91 86  Triglycerides <150 mg/dL 64 - 73 61 48  Creatinine 0.50 - 0.99 mg/dL 9.14 7.82 9.56 2.13 0.86   BP/Weight 10/02/2016 05/08/2016 02/15/2016 11/22/2015 09/14/2015 03/30/2015 11/08/2014  Systolic BP 160 126 118 132 130 138 124  Diastolic BP 90 84 82 76 80 80 74  Wt. (Lbs) 191.8 192 196 199 204.8 200.12 202  BMI 37.46 37.5 38.28 38.86 40 39.08 39.45   Foot/eye exam completion dates 03/13/2010  Foot exam Order yes  Foot Form Completion -      GERD Controlled, no change in medication   Pulmonary hypertension Followed by pulmonary and maintained on revatio  Anxiety and depression Controlled, no change in medication

## 2016-10-02 NOTE — Assessment & Plan Note (Signed)
Deteriorated. Patient re-educated about  the importance of commitment to a  minimum of 150 minutes of exercise per week.  The importance of healthy food choices with portion control discussed. Encouraged to start a food diary, count calories and to consider  joining a support group. Sample diet sheets offered. Goals set by the patient for the next several months.   Weight /BMI 10/02/2016 05/08/2016 02/15/2016  WEIGHT 191 lb 12.8 oz 192 lb 196 lb  HEIGHT 5\' 0"  5\' 0"  5\' 0"   BMI 37.46 kg/m2 37.5 kg/m2 38.28 kg/m2

## 2016-10-02 NOTE — Assessment & Plan Note (Signed)
Followed by pulmonary and maintained on revatio

## 2016-10-02 NOTE — Assessment & Plan Note (Signed)
Patient educated about the importance of limiting  Carbohydrate intake , the need to commit to daily physical activity for a minimum of 30 minutes , and to commit weight loss. The fact that changes in all these areas will reduce or eliminate all together the development of diabetes is stressed.  Updated lab needed at/ before next visit.  Diabetic Labs Latest Ref Rng & Units 05/14/2016 11/20/2015 05/25/2015 11/08/2014 04/29/2014  HbA1c <5.7 % 5.5 5.8(H) 6.0(H) 6.0(H) 6.0(H)  Microalbumin 0.00 - 1.89 mg/dL - - - - -  Micro/Creat Ratio 0.0 - 30.0 mg/g - - - - -  Chol 125 - 200 mg/dL 161198 - 096165 045192 409182  HDL >=46 mg/dL 75 - 75 89 86  Calc LDL <130 mg/dL 811110 - 75 91 86  Triglycerides <150 mg/dL 64 - 73 61 48  Creatinine 0.50 - 0.99 mg/dL 9.140.68 7.820.67 9.560.74 2.130.73 0.861.02   BP/Weight 10/02/2016 05/08/2016 02/15/2016 11/22/2015 09/14/2015 03/30/2015 11/08/2014  Systolic BP 160 126 118 132 130 138 124  Diastolic BP 90 84 82 76 80 80 74  Wt. (Lbs) 191.8 192 196 199 204.8 200.12 202  BMI 37.46 37.5 38.28 38.86 40 39.08 39.45   Foot/eye exam completion dates 03/13/2010  Foot exam Order yes  Foot Form Completion -

## 2016-10-02 NOTE — Assessment & Plan Note (Signed)
Controlled, no change in medication DASH diet and commitment to daily physical activity for a minimum of 30 minutes discussed and encouraged, as a part of hypertension management. The importance of attaining a healthy weight is also discussed.  BP/Weight 10/02/2016 05/08/2016 02/15/2016 11/22/2015 09/14/2015 03/30/2015 11/08/2014  Systolic BP 160 126 118 132 130 138 124  Diastolic BP 90 84 82 76 80 80 74  Wt. (Lbs) 191.8 192 196 199 204.8 200.12 202  BMI 37.46 37.5 38.28 38.86 40 39.08 39.45

## 2016-10-02 NOTE — Assessment & Plan Note (Signed)
Hyperlipidemia:Low fat diet discussed and encouraged.   Lipid Panel  Lab Results  Component Value Date   CHOL 198 05/14/2016   HDL 75 05/14/2016   LDLCALC 110 05/14/2016   TRIG 64 05/14/2016   CHOLHDL 2.6 05/14/2016    Updated lab needed at/ before next visit.

## 2016-11-02 DIAGNOSIS — J984 Other disorders of lung: Secondary | ICD-10-CM | POA: Diagnosis not present

## 2016-11-21 ENCOUNTER — Other Ambulatory Visit: Payer: Self-pay | Admitting: Family Medicine

## 2016-11-21 DIAGNOSIS — E559 Vitamin D deficiency, unspecified: Secondary | ICD-10-CM

## 2016-11-25 DIAGNOSIS — E785 Hyperlipidemia, unspecified: Secondary | ICD-10-CM | POA: Diagnosis not present

## 2016-11-25 DIAGNOSIS — I10 Essential (primary) hypertension: Secondary | ICD-10-CM | POA: Diagnosis not present

## 2016-11-25 DIAGNOSIS — E559 Vitamin D deficiency, unspecified: Secondary | ICD-10-CM | POA: Diagnosis not present

## 2016-11-25 LAB — TSH: TSH: 1.37 m[IU]/L

## 2016-11-25 LAB — CBC
HEMATOCRIT: 40.8 % (ref 35.0–45.0)
HEMOGLOBIN: 13.2 g/dL (ref 11.7–15.5)
MCH: 28.9 pg (ref 27.0–33.0)
MCHC: 32.4 g/dL (ref 32.0–36.0)
MCV: 89.3 fL (ref 80.0–100.0)
MPV: 11.9 fL (ref 7.5–12.5)
Platelets: 189 10*3/uL (ref 140–400)
RBC: 4.57 MIL/uL (ref 3.80–5.10)
RDW: 14.6 % (ref 11.0–15.0)
WBC: 7.7 10*3/uL (ref 3.8–10.8)

## 2016-11-26 LAB — COMPLETE METABOLIC PANEL WITH GFR
ALBUMIN: 3.8 g/dL (ref 3.6–5.1)
ALT: 11 U/L (ref 6–29)
AST: 17 U/L (ref 10–35)
Alkaline Phosphatase: 57 U/L (ref 33–130)
BILIRUBIN TOTAL: 0.6 mg/dL (ref 0.2–1.2)
BUN: 13 mg/dL (ref 7–25)
CALCIUM: 9.2 mg/dL (ref 8.6–10.4)
CO2: 27 mmol/L (ref 20–31)
CREATININE: 0.72 mg/dL (ref 0.50–0.99)
Chloride: 108 mmol/L (ref 98–110)
GFR, Est Non African American: 86 mL/min (ref >=60)
Glucose, Bld: 95 mg/dL (ref 65–99)
Potassium: 3.9 mmol/L (ref 3.5–5.3)
SODIUM: 145 mmol/L (ref 135–146)
TOTAL PROTEIN: 6.6 g/dL (ref 6.1–8.1)

## 2016-11-26 LAB — LIPID PANEL
Cholesterol: 195 mg/dL (ref <200)
HDL: 83 mg/dL (ref >50)
LDL CALC: 99 mg/dL (ref <100)
TRIGLYCERIDES: 66 mg/dL (ref <150)
Total CHOL/HDL Ratio: 2.3 Ratio (ref <5.0)
VLDL: 13 mg/dL (ref <30)

## 2016-11-26 LAB — VITAMIN D 25 HYDROXY (VIT D DEFICIENCY, FRACTURES): VIT D 25 HYDROXY: 34 ng/mL (ref 30–100)

## 2016-11-27 ENCOUNTER — Other Ambulatory Visit: Payer: Self-pay | Admitting: Family Medicine

## 2016-12-02 ENCOUNTER — Encounter: Payer: Self-pay | Admitting: Family Medicine

## 2016-12-02 ENCOUNTER — Ambulatory Visit (INDEPENDENT_AMBULATORY_CARE_PROVIDER_SITE_OTHER): Payer: Medicare Other | Admitting: Family Medicine

## 2016-12-02 VITALS — BP 160/84 | HR 93 | Resp 16 | Ht 60.0 in | Wt 196.0 lb

## 2016-12-02 DIAGNOSIS — I10 Essential (primary) hypertension: Secondary | ICD-10-CM | POA: Diagnosis not present

## 2016-12-02 DIAGNOSIS — Z0000 Encounter for general adult medical examination without abnormal findings: Secondary | ICD-10-CM | POA: Diagnosis not present

## 2016-12-02 DIAGNOSIS — Z1211 Encounter for screening for malignant neoplasm of colon: Secondary | ICD-10-CM | POA: Diagnosis not present

## 2016-12-02 DIAGNOSIS — J984 Other disorders of lung: Secondary | ICD-10-CM | POA: Diagnosis not present

## 2016-12-02 LAB — POC HEMOCCULT BLD/STL (OFFICE/1-CARD/DIAGNOSTIC): Fecal Occult Blood, POC: NEGATIVE

## 2016-12-02 NOTE — Assessment & Plan Note (Signed)
Annual exam as documented.  patient with goals and time frames  set for achieving them. Immunization and cancer screening needs are specifically addressed at this visit.

## 2016-12-02 NOTE — Assessment & Plan Note (Signed)
Uncontrolled as pt had not taken her medication the day of the visit. Medications were administered at the visit

## 2016-12-02 NOTE — Progress Notes (Signed)
    Driscilla GrammesDiana M Gacek     MRN: [id number]      DOB: [birth year]  HPI: Patient is in for annual physical exam. No other health concerns are expressed or addressed at the visit. Recent labs,  are reviewed and are excellent Immunization is reviewed , and  updated if needed.   PE: Pleasant  female, alert and oriented x 3, in no cardio-pulmonary distress. Afebrile. HEENT No facial trauma or asymetry. Sinuses non tender.  Extra occullar muscles intact,  External ears normal, tympanic membranes clear. Oropharynx moist, no exudate. Neck: supple, no adenopathy,JVD or thyromegaly.No bruits.  Chest: Clear to ascultation bilaterally.No crackles or wheezes. Non tender to palpation  Breast: No asymetry,no masses or lumps. No tenderness. No nipple discharge or inversion. No axillary or supraclavicular adenopathy  Cardiovascular system; Heart sounds normal,  S1 and  S2 ,no S3.  No murmur, or thrill. Apical beat not displaced Peripheral pulses normal.  Abdomen: Soft, non tender, no organomegaly or masses. No bruits. Bowel sounds normal. No guarding, tenderness or rebound.  Rectal:  Normal sphincter tone. No rectal mass. Guaiac negative stool.  GU: Not examined  Musculoskeletal exam: Full ROM of spine, hips , shoulders and knees. No deformity ,swelling or crepitus noted. No muscle wasting or atrophy.   Neurologic: Cranial nerves 2 to 12 intact. Power, tone ,sensation and reflexes normal throughout. No disturbance in gait. No tremor.  Skin: Intact, no ulceration, erythema , scaling or rash noted. Pigmentation normal throughout  Psych; Normal mood and affect. Judgement and concentration normal   Assessment & Plan:  Annual physical exam Annual exam as documented.  patient with goals and time frames  set for achieving them. Immunization and cancer screening needs are specifically addressed at this visit.   Essential hypertension Uncontrolled as pt had not taken her  medication the day of the visit. Medications were administered at the visit

## 2016-12-02 NOTE — Patient Instructions (Signed)
Wellness visit in 3 months with nurse  MD follow up 5 months  Excellent labs   When you finish your current prescription of weekly vitamin D , just start taking OTC vit D3 2000IU once daily  Thank you  for choosing [facility]. We consider it a privelige to serve you.  Delivering excellent health care in a caring and  compassionate way is our goal.  Partnering with you,  so that together we can achieve this goal is our strategy.

## 2016-12-24 ENCOUNTER — Other Ambulatory Visit: Payer: Self-pay

## 2016-12-24 MED ORDER — OMEPRAZOLE 20 MG PO CPDR: 20.0000 mg | DELAYED_RELEASE_CAPSULE | Freq: Every day | ORAL | 1 refills | Status: DC

## 2016-12-24 MED ORDER — ALENDRONATE SODIUM 70 MG PO TABS: ORAL_TABLET | ORAL | 1 refills | Status: DC

## 2016-12-24 MED ORDER — ATORVASTATIN CALCIUM 20 MG PO TABS: ORAL_TABLET | ORAL | 1 refills | Status: DC

## 2016-12-24 MED ORDER — METOPROLOL SUCCINATE ER 50 MG PO TB24: ORAL_TABLET | ORAL | 1 refills | Status: DC

## 2016-12-24 MED ORDER — LOSARTAN POTASSIUM-HCTZ 100-12.5 MG PO TABS: 1.0000 | ORAL_TABLET | Freq: Every day | ORAL | 1 refills | Status: DC

## 2016-12-24 MED ORDER — SERTRALINE HCL 100 MG PO TABS: 100.0000 mg | ORAL_TABLET | Freq: Every day | ORAL | 1 refills | Status: DC

## 2016-12-24 MED ORDER — GABAPENTIN 300 MG PO CAPS: 300.0000 mg | ORAL_CAPSULE | Freq: Every day | ORAL | 1 refills | Status: DC

## 2016-12-26 ENCOUNTER — Other Ambulatory Visit: Payer: Self-pay

## 2017-01-02 DIAGNOSIS — J984 Other disorders of lung: Secondary | ICD-10-CM | POA: Diagnosis not present

## 2017-01-21 ENCOUNTER — Other Ambulatory Visit: Payer: Self-pay | Admitting: Family Medicine

## 2017-02-01 DIAGNOSIS — J984 Other disorders of lung: Secondary | ICD-10-CM | POA: Diagnosis not present

## 2017-02-14 ENCOUNTER — Other Ambulatory Visit: Payer: Self-pay | Admitting: Family Medicine

## 2017-02-14 DIAGNOSIS — E559 Vitamin D deficiency, unspecified: Secondary | ICD-10-CM

## 2017-02-19 ENCOUNTER — Telehealth: Payer: Self-pay

## 2017-02-19 NOTE — Telephone Encounter (Signed)
left message to reschedule AWV only -nr

## 2017-02-27 ENCOUNTER — Other Ambulatory Visit: Payer: Self-pay | Admitting: Family Medicine

## 2017-02-27 DIAGNOSIS — Z1231 Encounter for screening mammogram for malignant neoplasm of breast: Secondary | ICD-10-CM

## 2017-03-04 DIAGNOSIS — J984 Other disorders of lung: Secondary | ICD-10-CM | POA: Diagnosis not present

## 2017-03-05 ENCOUNTER — Ambulatory Visit: Payer: Medicare Other

## 2017-03-12 ENCOUNTER — Ambulatory Visit (INDEPENDENT_AMBULATORY_CARE_PROVIDER_SITE_OTHER): Payer: Medicare Other

## 2017-03-12 VITALS — BP 148/90 | HR 78 | Temp 98.5°F | Ht 60.0 in | Wt 197.1 lb

## 2017-03-12 DIAGNOSIS — Z0000 Encounter for general adult medical examination without abnormal findings: Secondary | ICD-10-CM

## 2017-03-12 DIAGNOSIS — Z23 Encounter for immunization: Secondary | ICD-10-CM

## 2017-03-12 NOTE — Patient Instructions (Addendum)
[name] , Thank you for taking time to come for your Medicare Wellness Visit. I appreciate your ongoing commitment to your health goals. Please review the following plan we discussed and let me know if I can assist you in the future.   Screening recommendations/referrals: Colonoscopy: Up to date, next due 10/2023 Mammogram: Up to date, scheduled for 04/10/2017 at 10:30 am Bone Density: Up to date Recommended yearly ophthalmology/optometry visit for glaucoma screening and checkup Recommended yearly dental visit for hygiene and checkup  Vaccinations: Influenza vaccine: Administered today Pneumococcal vaccine: Up to date Tdap vaccine: Due, declines Shingles vaccine: Due, declines    Advanced directives: Advance directive discussed with you today. I have provided a copy for you to complete at home and have notarized. Once this is complete please bring a copy in to our office so we can scan it into your chart.  Conditions/risks identified: Obese, recommend increasing your water intake and decreasing the amount of juice you are drinking. Continue your current exercise program.  Next appointment: Follow up with Dr. Lodema Hong on 05/05/2017 at 10:20 am. Follow up in 1 year for your annual wellness visit.   Preventive Care 50 Years and Older, Female Preventive care refers to lifestyle choices and visits with your health care provider that can promote health and wellness. What does preventive care include?  A yearly physical exam. This is also called an annual well check.  Dental exams once or twice a year.  Routine eye exams. Ask your health care provider how often you should have your eyes checked.  Personal lifestyle choices, including:  Daily care of your teeth and gums.  Regular physical activity.  Eating a healthy diet.  Avoiding tobacco and drug use.  Limiting alcohol use.  Practicing safe sex.  Taking low-dose aspirin every day.  Taking vitamin and mineral supplements as  recommended by your health care provider. What happens during an annual well check? The services and screenings done by your health care provider during your annual well check will depend on your age, overall health, lifestyle risk factors, and family history of disease. Counseling  Your health care provider may ask you questions about your:  Alcohol use.  Tobacco use.  Drug use.  Emotional well-being.  Home and relationship well-being.  Sexual activity.  Eating habits.  History of falls.  Memory and ability to understand (cognition).  Work and work Astronomer.  Reproductive health. Screening  You may have the following tests or measurements:  Height, weight, and BMI.  Blood pressure.  Lipid and cholesterol levels. These may be checked every 5 years, or more frequently if you are over 60 years old.  Skin check.  Lung cancer screening. You may have this screening every year starting at age 92 if you have a 30-pack-year history of smoking and currently smoke or have quit within the past 15 years.  Fecal occult blood test (FOBT) of the stool. You may have this test every year starting at age 66.  Flexible sigmoidoscopy or colonoscopy. You may have a sigmoidoscopy every 5 years or a colonoscopy every 10 years starting at age 1.  Hepatitis C blood test.  Hepatitis B blood test.  Sexually transmitted disease (STD) testing.  Diabetes screening. This is done by checking your blood sugar (glucose) after you have not eaten for a while (fasting). You may have this done every 1-3 years.  Bone density scan. This is done to screen for osteoporosis. You may have this done starting at age 61.  Mammogram. This may be done every 1-2 years. Talk to your health care provider about how often you should have regular mammograms. Talk with your health care provider about your test results, treatment options, and if necessary, the need for more tests. Vaccines  Your health care  provider may recommend certain vaccines, such as:  Influenza vaccine. This is recommended every year.  Tetanus, diphtheria, and acellular pertussis (Tdap, Td) vaccine. You may need a Td booster every 10 years.  Zoster vaccine. You may need this after age 38.  Pneumococcal 13-valent conjugate (PCV13) vaccine. One dose is recommended after age 91.  Pneumococcal polysaccharide (PPSV23) vaccine. One dose is recommended after age 29. Talk to your health care provider about which screenings and vaccines you need and how often you need them. This information is not intended to replace advice given to you by your health care provider. Make sure you discuss any questions you have with your health care provider. Document Released: 08/04/2015 Document Revised: 03/27/2016 Document Reviewed: 05/09/2015 Elsevier Interactive Patient Education  2017 [employer] Prevention in the Home Falls can cause injuries. They can happen to people of all ages. There are many things you can do to make your home safe and to help prevent falls. What can I do on the outside of my home?  Regularly fix the edges of walkways and driveways and fix any cracks.  Remove anything that might make you trip as you walk through a door, such as a raised step or threshold.  Trim any bushes or trees on the path to your home.  Use bright outdoor lighting.  Clear any walking paths of anything that might make someone trip, such as rocks or tools.  Regularly check to see if handrails are loose or broken. Make sure that both sides of any steps have handrails.  Any raised decks and porches should have guardrails on the edges.  Have any leaves, snow, or ice cleared regularly.  Use sand or salt on walking paths during winter.  Clean up any spills in your garage right away. This includes oil or grease spills. What can I do in the bathroom?  Use night lights.  Install grab bars by the toilet and in the tub and shower. Do  not use towel bars as grab bars.  Use non-skid mats or decals in the tub or shower.  If you need to sit down in the shower, use a plastic, non-slip stool.  Keep the floor dry. Clean up any water that spills on the floor as soon as it happens.  Remove soap buildup in the tub or shower regularly.  Attach bath mats securely with double-sided non-slip rug tape.  Do not have throw rugs and other things on the floor that can make you trip. What can I do in the bedroom?  Use night lights.  Make sure that you have a light by your bed that is easy to reach.  Do not use any sheets or blankets that are too big for your bed. They should not hang down onto the floor.  Have a firm chair that has side arms. You can use this for support while you get dressed.  Do not have throw rugs and other things on the floor that can make you trip. What can I do in the kitchen?  Clean up any spills right away.  Avoid walking on wet floors.  Keep items that you use a lot in easy-to-reach places.  If you need to reach  something above you, use a strong step stool that has a grab bar.  Keep electrical cords out of the way.  Do not use floor polish or wax that makes floors slippery. If you must use wax, use non-skid floor wax.  Do not have throw rugs and other things on the floor that can make you trip. What can I do with my stairs?  Do not leave any items on the stairs.  Make sure that there are handrails on both sides of the stairs and use them. Fix handrails that are broken or loose. Make sure that handrails are as long as the stairways.  Check any carpeting to make sure that it is firmly attached to the stairs. Fix any carpet that is loose or worn.  Avoid having throw rugs at the top or bottom of the stairs. If you do have throw rugs, attach them to the floor with carpet tape.  Make sure that you have a light switch at the top of the stairs and the bottom of the stairs. If you do not have them,  ask someone to add them for you. What else can I do to help prevent falls?  Wear shoes that:  Do not have high heels.  Have rubber bottoms.  Are comfortable and fit you well.  Are closed at the toe. Do not wear sandals.  If you use a stepladder:  Make sure that it is fully opened. Do not climb a closed stepladder.  Make sure that both sides of the stepladder are locked into place.  Ask someone to hold it for you, if possible.  Clearly mark and make sure that you can see:  Any grab bars or handrails.  First and last steps.  Where the edge of each step is.  Use tools that help you move around (mobility aids) if they are needed. These include:  Canes.  Walkers.  Scooters.  Crutches.  Turn on the lights when you go into a dark area. Replace any light bulbs as soon as they burn out.  Set up your furniture so you have a clear path. Avoid moving your furniture around.  If any of your floors are uneven, fix them.  If there are any pets around you, be aware of where they are.  Review your medicines with your doctor. Some medicines can make you feel dizzy. This can increase your chance of falling. Ask your doctor what other things that you can do to help prevent falls. This information is not intended to replace advice given to you by your health care provider. Make sure you discuss any questions you have with your health care provider. Document Released: 05/04/2009 Document Revised: 12/14/2015 Document Reviewed: 08/12/2014 Elsevier Interactive Patient Education  2017 [employer].

## 2017-03-12 NOTE — Progress Notes (Signed)
Subjective:   [name] is a 71 y.o. female who presents for Medicare Annual (Subsequent) preventive examination.  Review of Systems:  Cardiac Risk Factors include: advanced age (>44men, >22 women);hypertension;dyslipidemia;obesity (BMI >30kg/m2)     Objective:     Vitals: BP (!) 148/90   Pulse 78   Temp 98.5 F (36.9 C) (Oral)   Ht 5' (1.524 m)   Wt 197 lb 1.9 oz (89.4 kg)   BMI 38.50 kg/m   Body mass index is 38.5 kg/m.   Tobacco History  Smoking Status  . Never Smoker  Smokeless Tobacco  . Never Used     Counseling given: Not Answered   Past Medical History:  Diagnosis Date  . Asthma   . Back pain   . Depression   . Diverticulosis of colon   . GERD (gastroesophageal reflux disease)   . Hyperlipidemia   . Hypertension   . Obesity   . Osteoarthrosis, unspecified whether generalized or localized, lower leg    bilateral knees   . Pulmonary hypertension (HCC)    Past Surgical History:  Procedure Laterality Date  . ABDOMINAL HYSTERECTOMY    . COLONOSCOPY  2005   Dr. Jerolyn Shin Smith:numerous large scattered diverticula  . COLONOSCOPY N/A 11/15/2013   Dr. Darrick Penna: moderate diverticula, small internal hemorrhoids, redudant colon. Next TCS in 2025 with overtube.   . ESOPHAGOGASTRODUODENOSCOPY (EGD) WITH ESOPHAGEAL DILATION N/A 11/15/2013   Dr. 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 moderate erosive gastritis, negative H.pylori  . TOTAL KNEE ARTHROPLASTY  06/01/04   left / Dr. Romeo Apple  . TOTAL KNEE ARTHROPLASTY  11/29/03   right / Dr. Romeo Apple  . TUBAL LIGATION     Family History  Problem Relation Age of Onset  . Aneurysm Father   . Diabetes Father   . Diabetes Mother   . Hypertension Mother   . Heart disease Mother   . Hypertension Brother   . Diabetes Brother   . Diabetes Brother   . Hypertension Brother   . Colon cancer Maternal Uncle   . Hypertension Son    History  Sexual Activity  . Sexual activity: No    Outpatient Encounter  Prescriptions as of 03/12/2017  Medication Sig  . alendronate (FOSAMAX) 70 MG tablet TAKE 1 TABLET BY MOUTH EVERY 7 DAYS WITH A FULL GLASS OF WATER ON AN EMPTY STOMACH  . aspirin (ASPIRIN LOW DOSE) 81 MG EC tablet Take 81 mg by mouth daily. Take one tablet by mouth once a day   . atorvastatin (LIPITOR) 20 MG tablet TAKE 1 TABLET (20 MG TOTAL) BY MOUTH DAILY.  . Calcium Carbonate-Vit D-Min (CALCIUM 600 + MINERALS) 600-200 MG-UNIT TABS Take 2 tablets by mouth daily. Take one tablet by mouth three times a day   . gabapentin (NEURONTIN) 300 MG capsule Take 1 capsule (300 mg total) by mouth at bedtime.  Marland Kitchen losartan-hydrochlorothiazide (HYZAAR) 100-12.5 MG tablet Take 1 tablet by mouth daily.  . metoprolol succinate (TOPROL-XL) 50 MG 24 hr tablet TAKE 1 TABLET (50 MG TOTAL) BY MOUTH DAILY. TAKE WITH OR IMMEDIATELY FOLLOWING A MEAL.  Marland Kitchen omeprazole (PRILOSEC) 20 MG capsule Take 1 capsule (20 mg total) by mouth daily.  Marland Kitchen PROAIR HFA 108 (90 Base) MCG/ACT inhaler USE 2 PUFFS INTO LUNGS EVERY 6 HOURS AS NEEDED FOR WHEEZING OR SHORTNESS OF BREATH  . pyridOXINE (VITAMIN B-6) 100 MG tablet Take 200 mg by mouth daily.  . sertraline (ZOLOFT) 100 MG tablet Take 1 tablet (100 mg total)  by mouth daily.  . sildenafil (REVATIO) 20 MG tablet Take 60 mg by mouth daily.   . [DISCONTINUED] clotrimazole-betamethasone (LOTRISONE) cream Apply topically 2 (two) times daily.  . [DISCONTINUED] Multiple Vitamin (MULTIVITAMIN) tablet Take 1 tablet by mouth daily.   No facility-administered encounter medications on file as of 03/12/2017.     Activities of Daily Living In your present state of health, do you have any difficulty performing the following activities: 03/12/2017  Hearing? N  Vision? N  Difficulty concentrating or making decisions? N  Walking or climbing stairs? N  Dressing or bathing? N  Doing errands, shopping? N  Preparing Food and eating ? N  Using the Toilet? N  In the past six months, have you accidently  leaked urine? N  Do you have problems with loss of bowel control? N  Managing your Medications? N  Managing your Finances? N  Housekeeping or managing your Housekeeping? N  Some recent data might be hidden    Patient Care Team: Kerri Perches, MD as PCP - [facility], Darleene Cleaver, MD as Consulting Physician (Gastroenterology)    Assessment:    Exercise Activities and Dietary recommendations Current Exercise Habits: Structured exercise class, Type of exercise: Other - see comments (water aerobics), Time (Minutes): 60, Frequency (Times/Week): 3, Weekly Exercise (Minutes/Week): 180, Intensity: Mild  Goals    . Increase water intake          Recommend increasing your water intake to 64 ounces a day.       Fall Risk Fall Risk  03/12/2017 12/02/2016 11/22/2015 09/14/2015 03/30/2015  Falls in the past year? No No No No No  Risk for fall due to : - - - - -   Depression Screen PHQ 2/9 Scores 03/12/2017 12/02/2016 11/22/2015 09/15/2013  PHQ - 2 Score 0 0 0 0     Cognitive Function: Normal   6CIT Screen 03/12/2017  What Year? 0 points  What month? 0 points  What time? 0 points  Count back from 20 0 points  Months in reverse 0 points  Repeat phrase 0 points  Total Score 0    Immunization History  Administered Date(s) Administered  . H1N1 05/23/2008  . Influenza Split 04/02/2012, 04/21/2014, 03/05/2016  . Influenza Whole 04/08/2007, 05/01/2009, 04/03/2010  . Influenza,inj,Quad PF,6+ Mos 03/30/2015, 03/12/2017  . Pneumococcal Conjugate-13 08/04/2014  . Pneumococcal Polysaccharide-23 01/16/2004, 06/12/2010, 09/14/2015  . Td 01/16/2004  . Zoster 07/20/2008   Screening Tests Health Maintenance  Topic Date Due  . TETANUS/TDAP  10/28/2018 (Originally 01/15/2014)  . MAMMOGRAM  04/08/2018  . COLONOSCOPY  11/17/2023  . INFLUENZA VACCINE  Completed  . DEXA SCAN  Completed  . Hepatitis C Screening  Completed  . PNA vac Low Risk Adult  Completed      Plan:   I have personally  reviewed and noted the following in the patient's chart:   . Medical and social history . Use of alcohol, tobacco or illicit drugs  . Current medications and supplements . Functional ability and status . Nutritional status . Physical activity . Advanced directives . List of other physicians . Hospitalizations, surgeries, and ER visits in previous 12 months . Vitals . Screenings to include cognitive, depression, and falls . Referrals and appointments  In addition, I have reviewed and discussed with patient certain preventive protocols, quality metrics, and best practice recommendations. A written personalized care plan for preventive services as well as general preventive health recommendations were provided to patient.     Grover Canavan  Romeo Apple, LPN  1/61/0960

## 2017-04-04 DIAGNOSIS — J984 Other disorders of lung: Secondary | ICD-10-CM | POA: Diagnosis not present

## 2017-04-10 ENCOUNTER — Ambulatory Visit (HOSPITAL_COMMUNITY)
Admission: RE | Admit: 2017-04-10 | Discharge: 2017-04-10 | Disposition: A | Discharge status: Home / Self Care | Payer: Medicare Other | Source: Ambulatory Visit | Attending: Family Medicine | Admitting: Family Medicine

## 2017-04-10 DIAGNOSIS — Z1231 Encounter for screening mammogram for malignant neoplasm of breast: Secondary | ICD-10-CM | POA: Diagnosis not present

## 2017-05-04 DIAGNOSIS — J984 Other disorders of lung: Secondary | ICD-10-CM | POA: Diagnosis not present

## 2017-05-05 ENCOUNTER — Ambulatory Visit (INDEPENDENT_AMBULATORY_CARE_PROVIDER_SITE_OTHER): Payer: Medicare Other | Admitting: Family Medicine

## 2017-05-05 ENCOUNTER — Encounter: Payer: Self-pay | Admitting: Family Medicine

## 2017-05-05 VITALS — BP 170/86 | HR 95 | Temp 98.5°F | Resp 16 | Ht 60.0 in | Wt 202.5 lb

## 2017-05-05 DIAGNOSIS — E785 Hyperlipidemia, unspecified: Secondary | ICD-10-CM

## 2017-05-05 DIAGNOSIS — R7302 Impaired glucose tolerance (oral): Secondary | ICD-10-CM

## 2017-05-05 DIAGNOSIS — F419 Anxiety disorder, unspecified: Secondary | ICD-10-CM | POA: Diagnosis not present

## 2017-05-05 DIAGNOSIS — I272 Pulmonary hypertension, unspecified: Secondary | ICD-10-CM | POA: Diagnosis not present

## 2017-05-05 DIAGNOSIS — E669 Obesity, unspecified: Secondary | ICD-10-CM

## 2017-05-05 DIAGNOSIS — F329 Major depressive disorder, single episode, unspecified: Secondary | ICD-10-CM | POA: Diagnosis not present

## 2017-05-05 DIAGNOSIS — I10 Essential (primary) hypertension: Secondary | ICD-10-CM | POA: Diagnosis not present

## 2017-05-05 NOTE — Patient Instructions (Addendum)
Nurse BP check first week in November.  MD follow up in 4 months, call if you need me before  Blood pressure is high today but you have missed  Your medication, it is vital that you take your medication at the same time every day.   Fasting lipid , cmp and EGFr first weeks  November  Please work on good  health habits so that your health will improve. 1. Commitment to daily physical activity for 30 to 60  minutes, if you are able to do this.  2. Commitment to wise food choices. Aim for half of your  food intake to be vegetable and fruit, one quarter starchy foods, and one quarter protein. Try to eat on a regular schedule  3 meals per day, snacking between meals should be limited to vegetables or fruits or small portions of nuts. 64 ounces of water per day is generally recommended, unless you have specific health conditions, like heart failure or kidney failure where you will need to limit fluid intake.  3. Commitment to sufficient and a  good quality of physical and mental rest daily, generally between 6 to 8 hours per day.  WITH PERSISTANCE AND PERSEVERANCE, THE IMPOSSIBLE , BECOMES THE NORM! It is important that you exercise regularly at least 30 minutes 5 times a week. If you develop chest pain, have severe difficulty breathing, or feel very tired, stop exercising immediately and seek medical attention  Thank you  for choosing [facility]. We consider it a privelige to serve you.  Delivering excellent health care in a caring and  compassionate way is our goal.  Partnering with you,  so that together we can achieve this goal is our strategy.

## 2017-05-06 NOTE — Assessment & Plan Note (Signed)
Hyperlipidemia:Low fat diet discussed and encouraged.   Lipid Panel  Lab Results  Component Value Date   CHOL 195 11/25/2016   HDL 83 11/25/2016   LDLCALC 99 11/25/2016   TRIG 66 11/25/2016   CHOLHDL 2.3 11/25/2016   Updated lab needed

## 2017-05-06 NOTE — Assessment & Plan Note (Signed)
Syable and managed by pulmonary

## 2017-05-06 NOTE — Assessment & Plan Note (Signed)
Patient educated about the importance of limiting  Carbohydrate intake , the need to commit to daily physical activity for a minimum of 30 minutes , and to commit weight loss. The fact that changes in all these areas will reduce or eliminate all together the development of diabetes is stressed.  Updated lab needed .   Diabetic Labs Latest Ref Rng & Units 11/25/2016 05/14/2016 11/20/2015 05/25/2015 11/08/2014  HbA1c <5.7 % - 5.5 5.8(H) 6.0(H) 6.0(H)  Microalbumin 0.00 - 1.89 mg/dL - - - - -  Micro/Creat Ratio 0.0 - 30.0 mg/g - - - - -  Chol <200 mg/dL 161 096 - 045 409  HDL >50 mg/dL 83 75 - 75 89  Calc LDL <100 mg/dL 99 811 - 75 91  Triglycerides <150 mg/dL 66 64 - 73 61  Creatinine 0.50 - 0.99 mg/dL 9.14 7.82 9.56 2.13 0.86   BP/Weight 05/05/2017 03/12/2017 12/02/2016 10/02/2016 05/08/2016 02/15/2016 11/22/2015  Systolic BP 170 148 160 160 126 118 132  Diastolic BP 86 90 84 90 84 82 76  Wt. (Lbs) 202.5 197.12 196 191.8 192 196 199  BMI 39.55 38.5 38.28 37.46 37.5 38.28 38.86   Foot/eye exam completion dates 03/13/2010  Foot exam Order yes  Foot Form Completion -

## 2017-05-06 NOTE — Assessment & Plan Note (Signed)
Uncontrolled has not taken daily medication, needs nurse BP check in 2 to 3 week DASH diet and commitment to daily physical activity for a minimum of 30 minutes discussed and encouraged, as a part of hypertension management. The importance of attaining a healthy weight is also discussed.  BP/Weight 05/05/2017 03/12/2017 12/02/2016 10/02/2016 05/08/2016 02/15/2016 11/22/2015  Systolic BP 170 148 160 160 126 118 132  Diastolic BP 86 90 84 90 84 82 76  Wt. (Lbs) 202.5 197.12 196 191.8 192 196 199  BMI 39.55 38.5 38.28 37.46 37.5 38.28 38.86

## 2017-05-06 NOTE — Assessment & Plan Note (Signed)
Deteriorated. Patient re-educated about  the importance of commitment to a  minimum of 150 minutes of exercise per week.  The importance of healthy food choices with portion control discussed. Encouraged to start a food diary, count calories and to consider  joining a support group. Sample diet sheets offered. Goals set by the patient for the next several months.   Weight /BMI 05/05/2017 03/12/2017 12/02/2016  WEIGHT 202 lb 8 oz 197 lb 1.9 oz 196 lb  HEIGHT     BMI 39.55 kg/m2 38.5 kg/m2 38.28 kg/m2

## 2017-05-06 NOTE — Progress Notes (Signed)
[name]     MRN: [id number]      DOB: 1946/03/04   HPI Ms. Scantling is here for follow up and re-evaluation of chronic medical conditions, medication management and review of any available recent lab and radiology data.  Preventive health is updated, specifically  Cancer screening and Immunization.   Questions or concerns regarding consultations or procedures which the PT has had in the interim are  addressed. The PT denies any adverse reactions to current medications since the last visit.  There are no new concerns.  There are no specific complaints   ROS Denies recent fever or chills. Denies sinus pressure, nasal congestion, ear pain or sore throat. Denies chest congestion, productive cough or wheezing. Denies chest pains, palpitations and leg swelling Denies abdominal pain, nausea, vomiting,diarrhea or constipation.   Denies dysuria, frequency, hesitancy or incontinence. Denies joint pain, swelling and limitation in mobility. Denies headaches, seizures, numbness, or tingling. Denies  Uncontrolled epression, anxiety or insomnia. Denies skin break down or rash.   PE  BP (!) 170/86   Pulse 95   Temp 98.5 F (36.9 C) (Other (Comment))   Resp 16   Ht 5' (1.524 m)   Wt 202 lb 8 oz (91.9 kg)   SpO2 96%   BMI 39.55 kg/m   Patient alert and oriented and in no cardiopulmonary distress.  HEENT: No facial asymmetry, EOMI,   oropharynx pink and moist.  Neck supple no JVD, no mass.  Chest: Clear to auscultation bilaterally.  CVS: S1, S2 no murmurs, no S3.Regular rate.  ABD: Soft non tender.   Ext: No edema  MS: Adequate ROM spine, shoulders, hips and knees.  Skin: Intact, no ulcerations or rash noted.  Psych: Good eye contact, normal affect. Memory intact not anxious or depressed appearing.  CNS: CN 2-12 intact, power,  normal throughout.no focal deficits noted.   Assessment & Plan  Essential hypertension Uncontrolled has not taken daily medication, needs  nurse BP check in 2 to 3 week DASH diet and commitment to daily physical activity for a minimum of 30 minutes discussed and encouraged, as a part of hypertension management. The importance of attaining a healthy weight is also discussed.  BP/Weight 05/05/2017 03/12/2017 12/02/2016 10/02/2016 05/08/2016 02/15/2016 11/22/2015  Systolic BP 170 148 160 160 126 118 132  Diastolic BP 86 90 84 90 84 82 76  Wt. (Lbs) 202.5 197.12 196 191.8 192 196 199  BMI 39.55 38.5 38.28 37.46 37.5 38.28 38.86       Obesity (BMI 30-39.9) Deteriorated. Patient re-educated about  the importance of commitment to a  minimum of 150 minutes of exercise per week.  The importance of healthy food choices with portion control discussed. Encouraged to start a food diary, count calories and to consider  joining a support group. Sample diet sheets offered. Goals set by the patient for the next several months.   Weight /BMI 05/05/2017 03/12/2017 12/02/2016  WEIGHT 202 lb 8 oz 197 lb 1.9 oz 196 lb  HEIGHT     BMI 39.55 kg/m2 38.5 kg/m2 38.28 kg/m2      Hyperlipidemia LDL goal <100 Hyperlipidemia:Low fat diet discussed and encouraged.   Lipid Panel  Lab Results  Component Value Date   CHOL 195 11/25/2016   HDL 83 11/25/2016   LDLCALC 99 11/25/2016   TRIG 66 11/25/2016   CHOLHDL 2.3 11/25/2016   Updated lab needed      IGT (impaired glucose tolerance) Patient educated about the  importance of limiting  Carbohydrate intake , the need to commit to daily physical activity for a minimum of 30 minutes , and to commit weight loss. The fact that changes in all these areas will reduce or eliminate all together the development of diabetes is stressed.  Updated lab needed .   Diabetic Labs Latest Ref Rng & Units 11/25/2016 05/14/2016 11/20/2015 05/25/2015 11/08/2014  HbA1c <5.7 % - 5.5 5.8(H) 6.0(H) 6.0(H)  Microalbumin 0.00 - 1.89 mg/dL - - - - -  Micro/Creat Ratio 0.0 - 30.0 mg/g - - - - -  Chol <200 mg/dL  161 096 - 045 409  HDL >50 mg/dL 83 75 - 75 89  Calc LDL <100 mg/dL 99 811 - 75 91  Triglycerides <150 mg/dL 66 64 - 73 61  Creatinine 0.50 - 0.99 mg/dL 9.14 7.82 9.56 2.13 0.86   BP/Weight 05/05/2017 03/12/2017 12/02/2016 10/02/2016 05/08/2016 02/15/2016 11/22/2015  Systolic BP 170 148 160 160 126 118 132  Diastolic BP 86 90 84 90 84 82 76  Wt. (Lbs) 202.5 197.12 196 191.8 192 196 199  BMI 39.55 38.5 38.28 37.46 37.5 38.28 38.86   Foot/eye exam completion dates 03/13/2010  Foot exam Order yes  Foot Form Completion -      Pulmonary hypertension Syable and managed by pulmonary  Anxiety and depression Controlled, no change in medication

## 2017-05-06 NOTE — Assessment & Plan Note (Signed)
Controlled, no change in medication  

## 2017-05-21 ENCOUNTER — Other Ambulatory Visit: Payer: Self-pay | Admitting: Family Medicine

## 2017-05-24 ENCOUNTER — Other Ambulatory Visit: Payer: Self-pay | Admitting: Family Medicine

## 2017-05-28 ENCOUNTER — Other Ambulatory Visit: Payer: Self-pay | Admitting: Family Medicine

## 2017-06-04 DIAGNOSIS — J984 Other disorders of lung: Secondary | ICD-10-CM | POA: Diagnosis not present

## 2017-06-09 DIAGNOSIS — I10 Essential (primary) hypertension: Secondary | ICD-10-CM | POA: Diagnosis not present

## 2017-06-09 DIAGNOSIS — E785 Hyperlipidemia, unspecified: Secondary | ICD-10-CM | POA: Diagnosis not present

## 2017-06-09 LAB — LIPID PANEL
CHOL/HDL RATIO: 2.3 (calc) (ref <5.0)
Cholesterol: 197 mg/dL (ref <200)
HDL: 87 mg/dL (ref >50)
LDL Cholesterol (Calc): 95 mg/dL (calc)
NON-HDL CHOLESTEROL (CALC): 110 mg/dL (ref <130)
Triglycerides: 61 mg/dL (ref <150)

## 2017-06-09 LAB — COMPLETE METABOLIC PANEL WITH GFR
AG RATIO: 1.3 (calc) (ref 1.0–2.5)
ALT: 12 U/L (ref 6–29)
AST: 18 U/L (ref 10–35)
Albumin: 3.7 g/dL (ref 3.6–5.1)
Alkaline phosphatase (APISO): 57 U/L (ref 33–130)
BUN: 16 mg/dL (ref 7–25)
CALCIUM: 9 mg/dL (ref 8.6–10.4)
CO2: 28 mmol/L (ref 20–32)
CREATININE: 0.77 mg/dL (ref 0.50–0.99)
Chloride: 109 mmol/L (ref 98–110)
GFR, EST AFRICAN AMERICAN: 91 mL/min/{1.73_m2} (ref >=60)
GFR, EST NON AFRICAN AMERICAN: 79 mL/min/{1.73_m2} (ref >=60)
Globulin: 2.9 g/dL (calc) (ref 1.9–3.7)
Glucose, Bld: 120 mg/dL — ABNORMAL HIGH (ref 65–99)
POTASSIUM: 3.9 mmol/L (ref 3.5–5.3)
Sodium: 144 mmol/L (ref 135–146)
TOTAL PROTEIN: 6.6 g/dL (ref 6.1–8.1)
Total Bilirubin: 0.6 mg/dL (ref 0.2–1.2)

## 2017-06-18 ENCOUNTER — Telehealth: Payer: Self-pay

## 2017-06-18 DIAGNOSIS — R7301 Impaired fasting glucose: Secondary | ICD-10-CM

## 2017-06-18 NOTE — Telephone Encounter (Signed)
-----   Message from Kerri PerchesMargaret E Simpson, MD sent at 06/09/2017 10:06 PM EST ----- Pt needs additional blood draw for HBA1C as FBG hIGH at 120.  Pls advise excellent liver and  kidney function  and cholesterol labs

## 2017-06-23 ENCOUNTER — Telehealth: Payer: Self-pay | Admitting: Orthopedic Surgery

## 2017-06-23 NOTE — Telephone Encounter (Signed)
Her knee replacement was in 2005. Continue premeds for dental work ?

## 2017-06-23 NOTE — Telephone Encounter (Signed)
yes

## 2017-06-23 NOTE — Telephone Encounter (Signed)
Patient stopped by our office; asking if she is still to have pre-anti-biotics for dental appointment (scheduled in January). She has had bilateral total knee replacement surgery.  Her pharmacy is [facility], [place]; states has no known allergies to medications. Ph# [phone]

## 2017-06-24 ENCOUNTER — Telehealth: Payer: Self-pay | Admitting: Orthopedic Surgery

## 2017-06-24 MED ORDER — CEPHALEXIN 500 MG PO CAPS: 2000.0000 mg | ORAL_CAPSULE | Freq: Once | ORAL | 1 refills | Status: DC

## 2017-06-24 NOTE — Telephone Encounter (Signed)
Sent in for patient, will call her

## 2017-06-24 NOTE — Telephone Encounter (Signed)
Left message for her to call back so I can let her know Dr Romeo AppleHarrison does want her to continue ABX and the Rx was sent in for her to [facility]

## 2017-06-24 NOTE — Telephone Encounter (Signed)
Patient left message on voicemail about if she would need the antibiotics. I called her back and relayed your message. She said thank you to Dr. Romeo AppleHarrison.

## 2017-07-02 DIAGNOSIS — R7301 Impaired fasting glucose: Secondary | ICD-10-CM | POA: Diagnosis not present

## 2017-07-03 LAB — HEMOGLOBIN A1C
EAG (MMOL/L): 6.5 (calc)
Hgb A1c MFr Bld: 5.7 % of total Hgb — ABNORMAL HIGH (ref <5.7)
MEAN PLASMA GLUCOSE: 117 (calc)

## 2017-07-04 DIAGNOSIS — J984 Other disorders of lung: Secondary | ICD-10-CM | POA: Diagnosis not present

## 2017-08-04 DIAGNOSIS — J984 Other disorders of lung: Secondary | ICD-10-CM | POA: Diagnosis not present

## 2017-09-01 ENCOUNTER — Encounter: Payer: Self-pay | Admitting: Family Medicine

## 2017-09-01 ENCOUNTER — Ambulatory Visit (INDEPENDENT_AMBULATORY_CARE_PROVIDER_SITE_OTHER): Payer: Medicare Other | Admitting: Family Medicine

## 2017-09-01 VITALS — BP 138/80 | HR 76 | Resp 16 | Ht 60.0 in | Wt 197.0 lb

## 2017-09-01 DIAGNOSIS — F419 Anxiety disorder, unspecified: Secondary | ICD-10-CM

## 2017-09-01 DIAGNOSIS — G5602 Carpal tunnel syndrome, left upper limb: Secondary | ICD-10-CM | POA: Diagnosis not present

## 2017-09-01 DIAGNOSIS — E669 Obesity, unspecified: Secondary | ICD-10-CM

## 2017-09-01 DIAGNOSIS — R7301 Impaired fasting glucose: Secondary | ICD-10-CM

## 2017-09-01 DIAGNOSIS — E785 Hyperlipidemia, unspecified: Secondary | ICD-10-CM

## 2017-09-01 DIAGNOSIS — I10 Essential (primary) hypertension: Secondary | ICD-10-CM | POA: Diagnosis not present

## 2017-09-01 DIAGNOSIS — R7302 Impaired glucose tolerance (oral): Secondary | ICD-10-CM

## 2017-09-01 DIAGNOSIS — E559 Vitamin D deficiency, unspecified: Secondary | ICD-10-CM | POA: Diagnosis not present

## 2017-09-01 DIAGNOSIS — I272 Pulmonary hypertension, unspecified: Secondary | ICD-10-CM | POA: Diagnosis not present

## 2017-09-01 DIAGNOSIS — F329 Major depressive disorder, single episode, unspecified: Secondary | ICD-10-CM

## 2017-09-01 NOTE — Patient Instructions (Addendum)
Physical exam with MD in June, call if you need me sooner   Congrats on good blood sugar  Fasting lipid, cmp and EGFR, CBc, tSh and vitamin D last week in May  Please get carpal tunnel brace for your left hand  We will see if we can find out about another oxygen tank for you and let you know   Please work on good  health habits so that your health will improve. 1. Commitment to daily physical activity for 30 to 60  minutes, if you are able to do this.  2. Commitment to wise food choices. Aim for half of your  food intake to be vegetable and fruit, one quarter starchy foods, and one quarter protein. Try to eat on a regular schedule  3 meals per day, snacking between meals should be limited to vegetables or fruits or small portions of nuts. 64 ounces of water per day is generally recommended, unless you have specific health conditions, like heart failure or kidney failure where you will need to limit fluid intake.  3. Commitment to sufficient and a  good quality of physical and mental rest daily, generally between 6 to 8 hours per day.  WITH PERSISTANCE AND PERSEVERANCE, THE IMPOSSIBLE , BECOMES THE NORM! Thank you  for choosing [facility]. We consider it a privelige to serve you.  Delivering excellent health care in a caring and  compassionate way is our goal.  Partnering with you,  so that together we can achieve this goal is our strategy.

## 2017-09-01 NOTE — Progress Notes (Signed)
[name]     MRN: [id number]      DOB: 1945/11/30   HPI [name] is here for follow up and re-evaluation of chronic medical conditions, medication management and review of any available recent lab and radiology data.  Preventive health is updated, specifically  Cancer screening and Immunization.   Questions or concerns regarding consultations or procedures which the PT has had in the interim are  addressed. The PT denies any adverse reactions to current medications since the last visit.  Pt states she stays for 2 nights per week in another home with  Mother who is 10 and is requesting oxygen at that location, she does require oxygen at night , will write a letter of support and see what her insurance decides Numbness and pain in left hand x 5 weeks  Awakens her has carpal tunnel in right hand only uses a brace   Ears hurt and she wants them checked  ROS Denies recent fever or chills. Denies sinus pressure, nasal congestion, ear pain or sore throat. Denies chest congestion, productive cough or wheezing. Denies chest pains, palpitations and leg swelling Denies abdominal pain, nausea, vomiting,diarrhea or constipation.   Denies dysuria, frequency, hesitancy or incontinence. . Denies depression, anxiety or insomnia. Denies skin break down or rash.   PE  BP 140/84   Pulse 76   Resp 16   Ht 5' (1.524 m)   Wt 197 lb (89.4 kg)   SpO2 94%   BMI 38.47 kg/m   Patient alert and oriented and in no cardiopulmonary distress.  HEENT: No facial asymmetry, EOMI,   oropharynx pink and moist.  Neck supple no JVD, no mass.  Chest: Clear to auscultation bilaterally.  CVS: S1, S2 no murmurs, no S3.Regular rate.  ABD: Soft non tender.   Ext: No edema  VH:[id number]  ROM spine, adequate in  shoulders, hips and knees. Positive Tinel's and left thenar wasting Skin: Intact, no ulcerations or rash noted.  Psych: Good eye contact, normal affect. Memory intact not anxious or depressed  appearing.  CNS: CN 2-12 intact, power,  normal throughout.no focal deficits noted.   Assessment & Plan  Essential hypertension Elevated at visit , but pt has not taken medication DASH diet and commitment to daily physical activity for a minimum of 30 minutes discussed and encouraged, as a part of hypertension management. The importance of attaining a healthy weight is also discussed.  BP/Weight 09/01/2017 05/05/2017 03/12/2017 12/02/2016 10/02/2016 05/08/2016 02/15/2016  Systolic BP 138 170 148 160 160 126 118  Diastolic BP 80 86 90 84 90 84 82  Wt. (Lbs) 197 202.5 197.12 196 191.8 192 196  BMI 38.47 39.55 38.5 38.28 37.46 37.5 38.28       Hyperlipidemia LDL goal <100 Hyperlipidemia:Low fat diet discussed and encouraged. Controlled, no change in medication    Lipid Panel  Lab Results  Component Value Date   CHOL 197 06/09/2017   HDL 87 06/09/2017   LDLCALC 99 11/25/2016   TRIG 61 06/09/2017   CHOLHDL 2.3 06/09/2017       IGT (impaired glucose tolerance) Patient educated about the importance of limiting  Carbohydrate intake , the need to commit to daily physical activity for a minimum of 30 minutes , and to commit weight loss. The fact that changes in all these areas will reduce or eliminate all together the development of diabetes is stressed.   Diabetic Labs Latest Ref Rng & Units 07/02/2017 06/09/2017 11/25/2016 05/14/2016 11/20/2015  HbA1c <  5.7 % of total Hgb 5.7(H) - - 5.5 5.8(H)  Microalbumin 0.00 - 1.89 mg/dL - - - - -  Micro/Creat Ratio 0.0 - 30.0 mg/g - - - - -  Chol <200 mg/dL - 696197 295195 284198 -  HDL >13>50 mg/dL - 87 83 75 -  Calc LDL <100 mg/dL - - 99 244110 -  Triglycerides <150 mg/dL - 61 66 64 -  Creatinine 0.50 - 0.99 mg/dL - 0.100.77 2.720.72 5.360.68 6.440.67   BP/Weight 09/01/2017 05/05/2017 03/12/2017 12/02/2016 10/02/2016 05/08/2016 02/15/2016  Systolic BP 138 170 148 160 160 126 118  Diastolic BP 80 86 90 84 90 84 82  Wt. (Lbs) 197 202.5 197.12 196 191.8 192 196  BMI 38.47  39.55 38.5 38.28 37.46 37.5 38.28   Foot/eye exam completion dates 03/13/2010  Foot exam Order yes  Foot Form Completion -      Obesity (BMI 30-39.9) Improved. Patient re-educated about  the importance of commitment to a  minimum of 150 minutes of exercise per week.  The importance of healthy food choices with portion control discussed. Encouraged to start a food diary, count calories and to consider  joining a support group. Sample diet sheets offered. Goals set by the patient for the next several months.   Weight /BMI 09/01/2017 05/05/2017 03/12/2017  WEIGHT 197 lb 202 lb 8 oz 197 lb 1.9 oz  HEIGHT 5\' 0"  5\' 0"  5\' 0"   BMI 38.47 kg/m2 39.55 kg/m2 38.5 kg/m2      Anxiety and depression Controlled, no change in medication   Carpal tunnel syndrome on left Symptoms and signs are consistent with carpal tunnel. Conservative management only at this time, pt to purchase a brace

## 2017-09-02 ENCOUNTER — Encounter: Payer: Self-pay | Admitting: Family Medicine

## 2017-09-02 NOTE — Assessment & Plan Note (Addendum)
Hyperlipidemia:Low fat diet discussed and encouraged. Controlled, no change in medication    Lipid Panel  Lab Results  Component Value Date   CHOL 197 06/09/2017   HDL 87 06/09/2017   LDLCALC 99 11/25/2016   TRIG 61 06/09/2017   CHOLHDL 2.3 06/09/2017

## 2017-09-02 NOTE — Assessment & Plan Note (Signed)
Patient educated about the importance of limiting  Carbohydrate intake , the need to commit to daily physical activity for a minimum of 30 minutes , and to commit weight loss. The fact that changes in all these areas will reduce or eliminate all together the development of diabetes is stressed.   Diabetic Labs Latest Ref Rng & Units 07/02/2017 06/09/2017 11/25/2016 05/14/2016 11/20/2015  HbA1c <5.7 % of total Hgb 5.7(H) - - 5.5 5.8(H)  Microalbumin 0.00 - 1.89 mg/dL - - - - -  Micro/Creat Ratio 0.0 - 30.0 mg/g - - - - -  Chol <200 mg/dL - 295197 621195 308198 -  HDL >65>50 mg/dL - 87 83 75 -  Calc LDL <100 mg/dL - - 99 784110 -  Triglycerides <150 mg/dL - 61 66 64 -  Creatinine 0.50 - 0.99 mg/dL - 6.960.77 2.950.72 2.840.68 1.320.67   BP/Weight 09/01/2017 05/05/2017 03/12/2017 12/02/2016 10/02/2016 05/08/2016 02/15/2016  Systolic BP 138 170 148 160 160 126 118  Diastolic BP 80 86 90 84 90 84 82  Wt. (Lbs) 197 202.5 197.12 196 191.8 192 196  BMI 38.47 39.55 38.5 38.28 37.46 37.5 38.28   Foot/eye exam completion dates 03/13/2010  Foot exam Order yes  Foot Form Completion -

## 2017-09-02 NOTE — Assessment & Plan Note (Signed)
Symptoms and signs are consistent with carpal tunnel. Conservative management only at this time, pt to purchase a brace

## 2017-09-02 NOTE — Assessment & Plan Note (Signed)
Controlled, no change in medication  

## 2017-09-02 NOTE — Assessment & Plan Note (Signed)
Improved. Patient re-educated about  the importance of commitment to a  minimum of 150 minutes of exercise per week.  The importance of healthy food choices with portion control discussed. Encouraged to start a food diary, count calories and to consider  joining a support group. Sample diet sheets offered. Goals set by the patient for the next several months.   Weight /BMI 09/01/2017 05/05/2017 03/12/2017  WEIGHT 197 lb 202 lb 8 oz 197 lb 1.9 oz  HEIGHT 5\' 0"  5\' 0"  5\' 0"   BMI 38.47 kg/m2 39.55 kg/m2 38.5 kg/m2

## 2017-09-02 NOTE — Assessment & Plan Note (Signed)
Elevated at visit , but pt has not taken medication DASH diet and commitment to daily physical activity for a minimum of 30 minutes discussed and encouraged, as a part of hypertension management. The importance of attaining a healthy weight is also discussed.  BP/Weight 09/01/2017 05/05/2017 03/12/2017 12/02/2016 10/02/2016 05/08/2016 02/15/2016  Systolic BP 138 170 148 160 160 126 118  Diastolic BP 80 86 90 84 90 84 82  Wt. (Lbs) 197 202.5 197.12 196 191.8 192 196  BMI 38.47 39.55 38.5 38.28 37.46 37.5 38.28

## 2017-09-04 DIAGNOSIS — J984 Other disorders of lung: Secondary | ICD-10-CM | POA: Diagnosis not present

## 2017-09-21 ENCOUNTER — Other Ambulatory Visit: Payer: Self-pay | Admitting: Family Medicine

## 2017-10-02 DIAGNOSIS — J984 Other disorders of lung: Secondary | ICD-10-CM | POA: Diagnosis not present

## 2017-10-07 ENCOUNTER — Other Ambulatory Visit: Payer: Self-pay | Admitting: Family Medicine

## 2017-11-02 DIAGNOSIS — J984 Other disorders of lung: Secondary | ICD-10-CM | POA: Diagnosis not present

## 2017-11-05 ENCOUNTER — Other Ambulatory Visit: Payer: Self-pay | Admitting: Family Medicine

## 2017-12-02 DIAGNOSIS — J984 Other disorders of lung: Secondary | ICD-10-CM | POA: Diagnosis not present

## 2017-12-29 DIAGNOSIS — E559 Vitamin D deficiency, unspecified: Secondary | ICD-10-CM | POA: Diagnosis not present

## 2017-12-29 DIAGNOSIS — E785 Hyperlipidemia, unspecified: Secondary | ICD-10-CM | POA: Diagnosis not present

## 2017-12-29 DIAGNOSIS — I272 Pulmonary hypertension, unspecified: Secondary | ICD-10-CM | POA: Diagnosis not present

## 2017-12-30 LAB — CBC
HCT: 37.3 % (ref 35.0–45.0)
Hemoglobin: 12.5 g/dL (ref 11.7–15.5)
MCH: 28.9 pg (ref 27.0–33.0)
MCHC: 33.5 g/dL (ref 32.0–36.0)
MCV: 86.1 fL (ref 80.0–100.0)
MPV: 12.4 fL (ref 7.5–12.5)
PLATELETS: 216 10*3/uL (ref 140–400)
RBC: 4.33 10*6/uL (ref 3.80–5.10)
RDW: 13 % (ref 11.0–15.0)
WBC: 8.3 10*3/uL (ref 3.8–10.8)

## 2017-12-30 LAB — COMPLETE METABOLIC PANEL WITH GFR
AG Ratio: 1.3 (calc) (ref 1.0–2.5)
ALT: 12 U/L (ref 6–29)
AST: 19 U/L (ref 10–35)
Albumin: 3.8 g/dL (ref 3.6–5.1)
Alkaline phosphatase (APISO): 63 U/L (ref 33–130)
BUN: 12 mg/dL (ref 7–25)
CALCIUM: 8.9 mg/dL (ref 8.6–10.4)
CO2: 28 mmol/L (ref 20–32)
Chloride: 106 mmol/L (ref 98–110)
Creat: 0.63 mg/dL (ref 0.50–0.99)
GFR, EST AFRICAN AMERICAN: 106 mL/min/{1.73_m2} (ref >=60)
GFR, EST NON AFRICAN AMERICAN: 92 mL/min/{1.73_m2} (ref >=60)
Globulin: 2.9 g/dL (calc) (ref 1.9–3.7)
Glucose, Bld: 112 mg/dL — ABNORMAL HIGH (ref 65–99)
POTASSIUM: 3.6 mmol/L (ref 3.5–5.3)
Sodium: 142 mmol/L (ref 135–146)
Total Bilirubin: 0.8 mg/dL (ref 0.2–1.2)
Total Protein: 6.7 g/dL (ref 6.1–8.1)

## 2017-12-30 LAB — LIPID PANEL
Cholesterol: 177 mg/dL (ref <200)
HDL: 67 mg/dL (ref >50)
LDL Cholesterol (Calc): 94 mg/dL (calc)
NON-HDL CHOLESTEROL (CALC): 110 mg/dL (ref <130)
Total CHOL/HDL Ratio: 2.6 (calc) (ref <5.0)
Triglycerides: 73 mg/dL (ref <150)

## 2017-12-30 LAB — VITAMIN D 25 HYDROXY (VIT D DEFICIENCY, FRACTURES): Vit D, 25-Hydroxy: 24 ng/mL — ABNORMAL LOW (ref 30–100)

## 2017-12-30 LAB — TSH: TSH: 2.13 m[IU]/L (ref 0.40–4.50)

## 2018-01-02 DIAGNOSIS — J984 Other disorders of lung: Secondary | ICD-10-CM | POA: Diagnosis not present

## 2018-01-06 ENCOUNTER — Encounter: Payer: Self-pay | Admitting: Family Medicine

## 2018-01-06 ENCOUNTER — Ambulatory Visit (INDEPENDENT_AMBULATORY_CARE_PROVIDER_SITE_OTHER): Payer: Medicare Other | Admitting: Family Medicine

## 2018-01-06 VITALS — BP 170/82 | HR 83 | Resp 16 | Ht 60.0 in | Wt 201.0 lb

## 2018-01-06 DIAGNOSIS — Z1231 Encounter for screening mammogram for malignant neoplasm of breast: Secondary | ICD-10-CM | POA: Diagnosis not present

## 2018-01-06 DIAGNOSIS — Z1211 Encounter for screening for malignant neoplasm of colon: Secondary | ICD-10-CM | POA: Diagnosis not present

## 2018-01-06 DIAGNOSIS — Z0000 Encounter for general adult medical examination without abnormal findings: Secondary | ICD-10-CM

## 2018-01-06 NOTE — Patient Instructions (Addendum)
MD follow up in early November, call if you need me before  Please keep Wellness appointment  Mammogram needs to be scheduled at checkout please due after Sept 20   Cologuard test from home is due  Blood pressure is high today, you need to take your medication at the same time every day  Please work on weight loss as we discussed  It is important that you exercise regularly at least 30 minutes 5 times a week. If you develop chest pain, have severe difficulty breathing, or feel very tired, stop exercising immediately and seek medical attention    Thanks for choosing [facility], we consider it a privelige to serve you.

## 2018-01-09 ENCOUNTER — Encounter: Payer: Self-pay | Admitting: Family Medicine

## 2018-01-09 NOTE — Assessment & Plan Note (Signed)

## 2018-01-09 NOTE — Assessment & Plan Note (Signed)
Deteriorated. Patient re-educated about  the importance of commitment to a  minimum of 150 minutes of exercise per week.  The importance of healthy food choices with portion control discussed. Encouraged to start a food diary, count calories and to consider  joining a support group. Sample diet sheets offered. Goals set by the patient for the next several months.   Weight /BMI 01/06/2018 09/01/2017 05/05/2017  WEIGHT 201 lb 197 lb 202 lb 8 oz  HEIGHT 5\' 0"  5\' 0"  5\' 0"   BMI 39.26 kg/m2 38.47 kg/m2 39.55 kg/m2

## 2018-01-09 NOTE — Progress Notes (Signed)
[name]     MRN: [id number]      DOB: [birth year]  HPI: Patient is in for annual physical exam. 3 week h/o intermittent left ear pain and roaring sound in the ear Recent labs, if available are reviewed. Immunization is reviewed , and  updated if needed.   PE: BP (!) 170/82   Pulse 83   Resp 16   Ht 5' (1.524 m)   Wt 201 lb (91.2 kg)   SpO2 94%   BMI 39.26 kg/m   Pleasant  female, alert and oriented x 3, in no cardio-pulmonary distress. Afebrile. HEENT No facial trauma or asymetry. Sinuses non tender.  Extra occullar muscles intact, pupils equally reactive to light. External ears normal, left TM occluded by cerumen, right TM clear with good light reflex  Oropharynx moist, no exudate.Poor dentition Neck: supple, no adenopathy,JVD or thyromegaly.No bruits.  Chest: Clear to ascultation bilaterally.No crackles or wheezes. Non tender to palpation  Breast: No asymetry,no masses or lumps. No tenderness. No nipple discharge or inversion. No axillary or supraclavicular adenopathy  Cardiovascular system; Heart sounds normal,  S1 and  S2 ,no S3.  No murmur, or thrill. Apical beat not displaced Peripheral pulses normal.  Abdomen: Soft, non tender, no organomegaly or masses. No bruits. Bowel sounds normal. No guarding, tenderness or rebound.  Rectal:  Not examined.  GU: Asymptomatic, no exam indicated   Musculoskeletal exam: Decreased  ROM of spine, hips , shoulders and knees. No deformity ,swelling or crepitus noted. No muscle wasting or atrophy.   Neurologic: Cranial nerves 2 to 12 intact. Power, tone ,sensation  normal throughout. No disturbance in gait. No tremor.  Skin: Intact, no ulceration, erythema , scaling or rash noted. Pigmentation normal throughout  Psych; Normal mood and affect. Judgement and concentration normal   Assessment & Plan:  Annual physical exam Annual exam as documented. Counseling done  re healthy lifestyle involving  commitment to 150 minutes exercise per week, heart healthy diet, and attaining healthy weight.The importance of adequate sleep also discussed. Regular seat belt use and home safety, is also discussed. Changes in health habits are decided on by the patient with goals and time frames  set for achieving them. Immunization and cancer screening needs are specifically addressed at this visit.   Morbid obesity (HCC) Deteriorated. Patient re-educated about  the importance of commitment to a  minimum of 150 minutes of exercise per week.  The importance of healthy food choices with portion control discussed. Encouraged to start a food diary, count calories and to consider  joining a support group. Sample diet sheets offered. Goals set by the patient for the next several months.   Weight /BMI 01/06/2018 09/01/2017 05/05/2017  WEIGHT 201 lb 197 lb 202 lb 8 oz  HEIGHT 5\' 0"  5\' 0"  5\' 0"   BMI 39.26 kg/m2 38.47 kg/m2 39.55 kg/m2      Essential hypertension Uncontrolled at visit, missed medication , which is not usiual for her. Importance of compoliance is stressed, has upcoming appointment in nthe next several weeks, will be reassed at that time DASH diet and commitment to daily physical activity for a minimum of 30 minutes discussed and encouraged, as a part of hypertension management. The importance of attaining a healthy weight is also discussed.  BP/Weight 01/06/2018 09/01/2017 05/05/2017 03/12/2017 12/02/2016 10/02/2016 05/08/2016  Systolic BP 170 138 170 148 160 160 126  Diastolic BP 82 80 86 90 84 90 84  Wt. (Lbs) 201 197 202.5 197.12 196 191.8  192  BMI 39.26 38.47 39.55 38.5 38.28 37.46 37.5

## 2018-01-09 NOTE — Assessment & Plan Note (Signed)
Uncontrolled at visit, missed medication , which is not usiual for her. Importance of compoliance is stressed, has upcoming appointment in nthe next several weeks, will be reassed at that time DASH diet and commitment to daily physical activity for a minimum of 30 minutes discussed and encouraged, as a part of hypertension management. The importance of attaining a healthy weight is also discussed.  BP/Weight 01/06/2018 09/01/2017 05/05/2017 03/12/2017 12/02/2016 10/02/2016 05/08/2016  Systolic BP 170 138 170 148 160 160 126  Diastolic BP 82 80 86 90 84 90 84  Wt. (Lbs) 201 197 202.5 197.12 196 191.8 192  BMI 39.26 38.47 39.55 38.5 38.28 37.46 37.5

## 2018-02-01 DIAGNOSIS — J984 Other disorders of lung: Secondary | ICD-10-CM | POA: Diagnosis not present

## 2018-03-04 DIAGNOSIS — J984 Other disorders of lung: Secondary | ICD-10-CM | POA: Diagnosis not present

## 2018-03-16 ENCOUNTER — Ambulatory Visit: Payer: Medicare Other

## 2018-03-16 ENCOUNTER — Ambulatory Visit (INDEPENDENT_AMBULATORY_CARE_PROVIDER_SITE_OTHER): Payer: Medicare Other

## 2018-03-16 VITALS — BP 130/84 | HR 74 | Resp 16 | Ht 60.0 in | Wt 199.0 lb

## 2018-03-16 DIAGNOSIS — Z780 Asymptomatic menopausal state: Secondary | ICD-10-CM

## 2018-03-16 DIAGNOSIS — Z0000 Encounter for general adult medical examination without abnormal findings: Secondary | ICD-10-CM

## 2018-03-16 DIAGNOSIS — Z23 Encounter for immunization: Secondary | ICD-10-CM | POA: Diagnosis not present

## 2018-03-16 NOTE — Patient Instructions (Addendum)
[name] , Thank you for taking time to come for your Medicare Wellness Visit. I appreciate your ongoing commitment to your health goals. Please review the following plan we discussed and let me know if I can assist you in the future.    Schedule bone density at checkout (try to get the same day as 9/26 mammogram)   Flu vaccine given today   Screening recommendations/referrals: Colonoscopy: up to date  Mammogram: scheduled  Bone Density: ordered Recommended yearly ophthalmology/optometry visit for glaucoma screening and checkup Recommended yearly dental visit for hygiene and checkup  Vaccinations: Influenza vaccine: due now Pneumococcal vaccine: up to date  Tdap vaccine: postponed until 10/2018 Shingles vaccine: ask insurance if covered     Advanced directives: form given   Conditions/risks identified: done   Next appointment: scheduled    Preventive Care 65 Years and Older, Female Preventive care refers to lifestyle choices and visits with your health care provider that can promote health and wellness. What does preventive care include?  A yearly physical exam. This is also called an annual well check.  Dental exams once or twice a year.  Routine eye exams. Ask your health care provider how often you should have your eyes checked.  Personal lifestyle choices, including:  Daily care of your teeth and gums.  Regular physical activity.  Eating a healthy diet.  Avoiding tobacco and drug use.  Limiting alcohol use.  Practicing safe sex.  Taking low-dose aspirin every day.  Taking vitamin and mineral supplements as recommended by your health care provider. What happens during an annual well check? The services and screenings done by your health care provider during your annual well check will depend on your age, overall health, lifestyle risk factors, and family history of disease. Counseling  Your health care provider may ask you questions about  your:  Alcohol use.  Tobacco use.  Drug use.  Emotional well-being.  Home and relationship well-being.  Sexual activity.  Eating habits.  History of falls.  Memory and ability to understand (cognition).  Work and work Astronomerenvironment.  Reproductive health. Screening  You may have the following tests or measurements:  Height, weight, and BMI.  Blood pressure.  Lipid and cholesterol levels. These may be checked every 5 years, or more frequently if you are over 70 years old.  Skin check.  Lung cancer screening. You may have this screening every year starting at age 70 if you have a 30-pack-year history of smoking and currently smoke or have quit within the past 15 years.  Fecal occult blood test (FOBT) of the stool. You may have this test every year starting at age 70.  Flexible sigmoidoscopy or colonoscopy. You may have a sigmoidoscopy every 5 years or a colonoscopy every 10 years starting at age 70.  Hepatitis C blood test.  Hepatitis B blood test.  Sexually transmitted disease (STD) testing.  Diabetes screening. This is done by checking your blood sugar (glucose) after you have not eaten for a while (fasting). You may have this done every 1-3 years.  Bone density scan. This is done to screen for osteoporosis. You may have this done starting at age 265.  Mammogram. This may be done every 1-2 years. Talk to your health care provider about how often you should have regular mammograms. Talk with your health care provider about your test results, treatment options, and if necessary, the need for more tests. Vaccines  Your health care provider may recommend certain vaccines, such as:  Influenza  vaccine. This is recommended every year.  Tetanus, diphtheria, and acellular pertussis (Tdap, Td) vaccine. You may need a Td booster every 10 years.  Zoster vaccine. You may need this after age 32.  Pneumococcal 13-valent conjugate (PCV13) vaccine. One dose is recommended  after age 67.  Pneumococcal polysaccharide (PPSV23) vaccine. One dose is recommended after age 31. Talk to your health care provider about which screenings and vaccines you need and how often you need them. This information is not intended to replace advice given to you by your health care provider. Make sure you discuss any questions you have with your health care provider. Document Released: 08/04/2015 Document Revised: 03/27/2016 Document Reviewed: 05/09/2015 Elsevier Interactive Patient Education  2017 [place] Prevention in the Home Falls can cause injuries. They can happen to people of all ages. There are many things you can do to make your home safe and to help prevent falls. What can I do on the outside of my home?  Regularly fix the edges of walkways and driveways and fix any cracks.  Remove anything that might make you trip as you walk through a door, such as a raised step or threshold.  Trim any bushes or trees on the path to your home.  Use bright outdoor lighting.  Clear any walking paths of anything that might make someone trip, such as rocks or tools.  Regularly check to see if handrails are loose or broken. Make sure that both sides of any steps have handrails.  Any raised decks and porches should have guardrails on the edges.  Have any leaves, snow, or ice cleared regularly.  Use sand or salt on walking paths during winter.  Clean up any spills in your garage right away. This includes oil or grease spills. What can I do in the bathroom?  Use night lights.  Install grab bars by the toilet and in the tub and shower. Do not use towel bars as grab bars.  Use non-skid mats or decals in the tub or shower.  If you need to sit down in the shower, use a plastic, non-slip stool.  Keep the floor dry. Clean up any water that spills on the floor as soon as it happens.  Remove soap buildup in the tub or shower regularly.  Attach bath mats securely with  double-sided non-slip rug tape.  Do not have throw rugs and other things on the floor that can make you trip. What can I do in the bedroom?  Use night lights.  Make sure that you have a light by your bed that is easy to reach.  Do not use any sheets or blankets that are too big for your bed. They should not hang down onto the floor.  Have a firm chair that has side arms. You can use this for support while you get dressed.  Do not have throw rugs and other things on the floor that can make you trip. What can I do in the kitchen?  Clean up any spills right away.  Avoid walking on wet floors.  Keep items that you use a lot in easy-to-reach places.  If you need to reach something above you, use a strong step stool that has a grab bar.  Keep electrical cords out of the way.  Do not use floor polish or wax that makes floors slippery. If you must use wax, use non-skid floor wax.  Do not have throw rugs and other things on the floor that can  make you trip. What can I do with my stairs?  Do not leave any items on the stairs.  Make sure that there are handrails on both sides of the stairs and use them. Fix handrails that are broken or loose. Make sure that handrails are as long as the stairways.  Check any carpeting to make sure that it is firmly attached to the stairs. Fix any carpet that is loose or worn.  Avoid having throw rugs at the top or bottom of the stairs. If you do have throw rugs, attach them to the floor with carpet tape.  Make sure that you have a light switch at the top of the stairs and the bottom of the stairs. If you do not have them, ask someone to add them for you. What else can I do to help prevent falls?  Wear shoes that:  Do not have high heels.  Have rubber bottoms.  Are comfortable and fit you well.  Are closed at the toe. Do not wear sandals.  If you use a stepladder:  Make sure that it is fully opened. Do not climb a closed stepladder.  Make  sure that both sides of the stepladder are locked into place.  Ask someone to hold it for you, if possible.  Clearly mark and make sure that you can see:  Any grab bars or handrails.  First and last steps.  Where the edge of each step is.  Use tools that help you move around (mobility aids) if they are needed. These include:  Canes.  Walkers.  Scooters.  Crutches.  Turn on the lights when you go into a dark area. Replace any light bulbs as soon as they burn out.  Set up your furniture so you have a clear path. Avoid moving your furniture around.  If any of your floors are uneven, fix them.  If there are any pets around you, be aware of where they are.  Review your medicines with your doctor. Some medicines can make you feel dizzy. This can increase your chance of falling. Ask your doctor what other things that you can do to help prevent falls. This information is not intended to replace advice given to you by your health care provider. Make sure you discuss any questions you have with your health care provider. Document Released: 05/04/2009 Document Revised: 12/14/2015 Document Reviewed: 08/12/2014 Elsevier Interactive Patient Education  2017 [employer].

## 2018-03-16 NOTE — Progress Notes (Signed)
Subjective:   [name] is a 72 y.o. female who presents for Medicare Annual (Subsequent) preventive examination.  Review of Systems:   Cardiac Risk Factors include: advanced age (>38men, >29 women);dyslipidemia;obesity (BMI >30kg/m2);hypertension     Objective:     Vitals: BP 130/84   Pulse 74   Resp 16   Ht 5' (1.524 m)   Wt 199 lb (90.3 kg)   SpO2 100%   BMI 38.86 kg/m   Body mass index is 38.86 kg/m.  Advanced Directives 03/12/2017 11/15/2013  Does Patient Have a Medical Advance Directive? Yes Patient has advance directive, copy not in chart  Type of Advance Directive Healthcare Power of Attorney Living will  Does patient want to make changes to medical advance directive? Yes (MAU/Ambulatory/Procedural Areas - Information given) -  Copy of Healthcare Power of Attorney in Chart? No - copy requested -  Pre-existing out of facility DNR order (yellow form or pink MOST form) - No    Tobacco Social History   Tobacco Use  Smoking Status Never Smoker  Smokeless Tobacco Never Used     Counseling given: Not Answered   Clinical Intake:  Pre-visit preparation completed: Yes  Pain : No/denies pain     Nutritional Status: BMI > 30  Obese Diabetes: No  How often do you need to have someone help you when you read instructions, pamphlets, or other written materials from your doctor or pharmacy?: 1 - Never What is the last grade level you completed in school?: 12 th grade     Information entered by :: brandi hudy LPN   Past Medical History:  Diagnosis Date  . Asthma   . Back pain   . Depression   . Diverticulosis of colon   . GERD (gastroesophageal reflux disease)   . Hyperlipidemia   . Hypertension   . Obesity   . Osteoarthrosis, unspecified whether generalized or localized, lower leg    bilateral knees   . Pulmonary hypertension (HCC)    Past Surgical History:  Procedure Laterality Date  . ABDOMINAL HYSTERECTOMY    . COLONOSCOPY  2005   Dr. Jerolyn Shin  Smith:numerous large scattered diverticula  . COLONOSCOPY N/A 11/15/2013   Dr. Darrick Penna: moderate diverticula, small internal hemorrhoids, redudant colon. Next TCS in 2025 with overtube.   . ESOPHAGOGASTRODUODENOSCOPY (EGD) WITH ESOPHAGEAL DILATION N/A 11/15/2013   Dr. 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 moderate erosive gastritis, negative H.pylori  . TOTAL KNEE ARTHROPLASTY  06/01/04   left / Dr. Romeo Apple  . TOTAL KNEE ARTHROPLASTY  11/29/03   right / Dr. Romeo Apple  . TUBAL LIGATION     Family History  Problem Relation Age of Onset  . Aneurysm Father   . Diabetes Father   . Diabetes Mother   . Hypertension Mother   . Heart disease Mother   . Hypertension Brother   . Diabetes Brother   . Diabetes Brother   . Hypertension Brother   . Colon cancer Maternal Uncle   . Hypertension Son    Social History   Socioeconomic History  . Marital status: Married    Spouse name: Not on file  . Number of children: 2  . Years of education: 78  . Highest education level: Not on file  Occupational History  . Occupation: retired     Associate Professor: UNEMPLOYED  Social Needs  . Financial resource strain: Not hard at all  . Food insecurity:    Worry: Never true    Inability: Never true  .  Transportation needs:    Medical: No    Non-medical: No  Tobacco Use  . Smoking status: Never Smoker  . Smokeless tobacco: Never Used  Substance and Sexual Activity  . Alcohol use: No  . Drug use: No  . Sexual activity: Never    Birth control/protection: Surgical  Lifestyle  . Physical activity:    Days per week: 3 days    Minutes per session: 60 min  . Stress: Not at all  Relationships  . Social connections:    Talks on phone: More than three times a week    Gets together: More than three times a week    Attends religious service: More than 4 times per year    Active member of club or organization: Yes    Attends meetings of clubs or organizations: 1 to 4 times per year    Relationship  status: Married  Other Topics Concern  . Not on file  Social History Narrative  . Not on file    Outpatient Encounter Medications as of 03/16/2018  Medication Sig  . alendronate (FOSAMAX) 70 MG tablet TAKE 1 TABLET BY MOUTH  EVERY 7 DAYS WITH A FULL  GLASS OF WATER ON AN EMPTY  STOMACH  . aspirin (ASPIRIN LOW DOSE) 81 MG EC tablet Take 81 mg by mouth daily. Take one tablet by mouth once a day   . atorvastatin (LIPITOR) 20 MG tablet TAKE 1 TABLET (20 MG TOTAL) BY MOUTH DAILY.  Marland Kitchen. atorvastatin (LIPITOR) 20 MG tablet TAKE 1 TABLET BY MOUTH EVERY DAY  . Calcium Carbonate-Vit D-Min (CALCIUM 600 + MINERALS) 600-200 MG-UNIT TABS Take 2 tablets by mouth daily. Take one tablet by mouth three times a day   . gabapentin (NEURONTIN) 300 MG capsule TAKE 1 CAPSULE BY MOUTH AT  BEDTIME  . losartan-hydrochlorothiazide (HYZAAR) 100-12.5 MG tablet TAKE 1 TABLET BY MOUTH  DAILY  . metoprolol succinate (TOPROL-XL) 50 MG 24 hr tablet TAKE 1 TABLET BY MOUTH  DAILY WITH OR IMMEDIATELY  FOLLOWING A MEAL  . omeprazole (PRILOSEC) 20 MG capsule TAKE 1 CAPSULE BY MOUTH  DAILY  . PROAIR HFA 108 (90 Base) MCG/ACT inhaler USE 2 PUFFS INTO LUNGS EVERY 6 HOURS AS NEEDED FOR WHEEZING OR SHORTNESS OF BREATH  . pyridOXINE (VITAMIN B-6) 100 MG tablet Take 200 mg by mouth daily.  . sertraline (ZOLOFT) 100 MG tablet TAKE 1 TABLET BY MOUTH  DAILY  . sildenafil (REVATIO) 20 MG tablet Take 60 mg by mouth daily.    No facility-administered encounter medications on file as of 03/16/2018.     Activities of Daily Living In your present state of health, do you have any difficulty performing the following activities: 03/16/2018  Hearing? N  Vision? N  Difficulty concentrating or making decisions? N  Walking or climbing stairs? N  Dressing or bathing? N  Doing errands, shopping? N  Preparing Food and eating ? N  Using the Toilet? N  In the past six months, have you accidently leaked urine? N  Do you have problems with loss of bowel  control? N  Managing your Medications? N  Managing your Finances? N  Housekeeping or managing your Housekeeping? N  Some recent data might be hidden    Patient Care Team: Kerri PerchesSimpson, Margaret E, MD as PCP - General Fields, Darleene CleaverSandi L, MD as Consulting Physician (Gastroenterology)    Assessment:   This is a routine wellness examination for Mallory MossesDiana.  Exercise Activities and Dietary recommendations Current Exercise Habits: Structured exercise  class, Type of exercise: Other - see comments(water aerobics ), Time (Minutes): 60, Frequency (Times/Week): 3, Weekly Exercise (Minutes/Week): 180, Intensity: Mild, Exercise limited by: orthopedic condition(s)  Goals    . Increase water intake     Recommend increasing your water intake to 64 ounces a day.        Fall Risk Fall Risk  03/16/2018 01/06/2018 03/12/2017 12/02/2016 11/22/2015  Falls in the past year? No No No No No  Risk for fall due to : - - - - -   Is the patient's home free of loose throw rugs in walkways, pet beds, electrical cords, etc?   yes      Grab bars in the bathroom? yes      Handrails on the stairs?   yes      Adequate lighting?   yes  Timed Get Up and Go performed:   Depression Screen PHQ 2/9 Scores 03/16/2018 01/06/2018 03/12/2017 12/02/2016  PHQ - 2 Score 0 0 0 0  PHQ- 9 Score - 4 - -     Cognitive Function     6CIT Screen 03/16/2018 03/12/2017  What Year? 0 points 0 points  What month? 0 points 0 points  What time? 0 points 0 points  Count back from 20 0 points 0 points  Months in reverse 0 points 0 points  Repeat phrase 2 points 0 points  Total Score 2 0    Immunization History  Administered Date(s) Administered  . H1N1 05/23/2008  . Influenza Split 04/02/2012, 04/21/2014, 03/05/2016  . Influenza Whole 04/08/2007, 05/01/2009, 04/03/2010  . Influenza,inj,Quad PF,6+ Mos 03/30/2015, 03/12/2017  . Pneumococcal Conjugate-13 08/04/2014  . Pneumococcal Polysaccharide-23 01/16/2004, 06/12/2010, 09/14/2015  . Td  01/16/2004  . Zoster 07/20/2008    Qualifies for Shingles Vaccine? Ask insurance if covered   Screening Tests Health Maintenance  Topic Date Due  . INFLUENZA VACCINE  02/19/2018  . TETANUS/TDAP  10/28/2018 (Originally 01/15/2014)  . MAMMOGRAM  04/11/2019  . COLONOSCOPY  11/17/2023  . DEXA SCAN  Completed  . Hepatitis C Screening  Completed  . PNA vac Low Risk Adult  Completed    Cancer Screenings: Lung: Low Dose CT Chest recommended if Age 84-80 years, 30 pack-year currently smoking OR have quit w/in 15years. Patient does not qualify. Breast:  Up to date on Mammogram? Yes  Scheduled  Up to date of Bone Density/Dexa? No  ordered Colorectal: up to date   Additional Screenings: Hepatitis C Screening:      Plan:      I have personally reviewed and noted the following in the patient's chart:   . Medical and social history . Use of alcohol, tobacco or illicit drugs  . Current medications and supplements . Functional ability and status . Nutritional status . Physical activity . Advanced directives . List of other physicians . Hospitalizations, surgeries, and ER visits in previous 12 months . Vitals . Screenings to include cognitive, depression, and falls . Referrals and appointments  In addition, I have reviewed and discussed with patient certain preventive protocols, quality metrics, and best practice recommendations. A written personalized care plan for preventive services as well as general preventive health recommendations were provided to patient.     Everitt Amber, LPN, LPN  1/61/0960

## 2018-03-22 ENCOUNTER — Other Ambulatory Visit: Payer: Self-pay | Admitting: Family Medicine

## 2018-04-04 DIAGNOSIS — J984 Other disorders of lung: Secondary | ICD-10-CM | POA: Diagnosis not present

## 2018-04-16 ENCOUNTER — Ambulatory Visit (HOSPITAL_COMMUNITY)
Admission: RE | Admit: 2018-04-16 | Discharge: 2018-04-16 | Disposition: A | Discharge status: Home / Self Care | Payer: Medicare Other | Source: Ambulatory Visit | Attending: Family Medicine | Admitting: Family Medicine

## 2018-04-16 ENCOUNTER — Encounter (HOSPITAL_COMMUNITY): Payer: Self-pay

## 2018-04-16 ENCOUNTER — Other Ambulatory Visit: Payer: Self-pay

## 2018-04-16 DIAGNOSIS — Z780 Asymptomatic menopausal state: Secondary | ICD-10-CM | POA: Diagnosis not present

## 2018-04-16 DIAGNOSIS — M85852 Other specified disorders of bone density and structure, left thigh: Secondary | ICD-10-CM | POA: Diagnosis not present

## 2018-04-16 DIAGNOSIS — Z1231 Encounter for screening mammogram for malignant neoplasm of breast: Secondary | ICD-10-CM

## 2018-04-16 NOTE — Patient Outreach (Signed)
[facility]) Care Management  04/16/2018  [name] March 05, 1946 [id number]   Medication Adherence call to [name] left a message for patient to call back patient is due on Losartan/ Hctz 100/25 mg. [name] is showing past due under [facility] Ins.  Lillia Abed CPhT Pharmacy Technician [facility] Care Management Direct Dial [phone]  Fax [phone] [email or web address]

## 2018-04-19 ENCOUNTER — Other Ambulatory Visit: Payer: Self-pay | Admitting: Family Medicine

## 2018-04-22 ENCOUNTER — Other Ambulatory Visit: Payer: Self-pay | Admitting: Family Medicine

## 2018-04-24 ENCOUNTER — Other Ambulatory Visit: Payer: Self-pay

## 2018-04-24 NOTE — Patient Outreach (Signed)
[facility]) Care Management  04/24/2018  CHIRSTINE DEFRAIN 1946/04/05 [id number]   Medication Adherence for Mrs. Aubrynn Katona spoke with  patient she said she is still taking Losartan / Hctz 100/12.5 mg  and if we can call [facility] to see if they have it ready because [facility] did not have it patient  will pick at [facility]. Mrs. Baldonado is showing past due under [facility] Ins.   Lillia Abed CPhT Pharmacy Technician [facility] Management Direct Dial [phone]  Fax [phone] [email or web address]

## 2018-05-04 DIAGNOSIS — J984 Other disorders of lung: Secondary | ICD-10-CM | POA: Diagnosis not present

## 2018-05-28 ENCOUNTER — Encounter: Payer: Self-pay | Admitting: Family Medicine

## 2018-05-28 ENCOUNTER — Ambulatory Visit (INDEPENDENT_AMBULATORY_CARE_PROVIDER_SITE_OTHER): Payer: Medicare Other | Admitting: Family Medicine

## 2018-05-28 VITALS — BP 170/84 | HR 76 | Resp 16 | Ht 60.0 in | Wt 204.0 lb

## 2018-05-28 DIAGNOSIS — M25562 Pain in left knee: Secondary | ICD-10-CM

## 2018-05-28 DIAGNOSIS — M25561 Pain in right knee: Secondary | ICD-10-CM

## 2018-05-28 DIAGNOSIS — E785 Hyperlipidemia, unspecified: Secondary | ICD-10-CM | POA: Diagnosis not present

## 2018-05-28 DIAGNOSIS — I10 Essential (primary) hypertension: Secondary | ICD-10-CM

## 2018-05-28 DIAGNOSIS — R7302 Impaired glucose tolerance (oral): Secondary | ICD-10-CM

## 2018-05-28 DIAGNOSIS — M25569 Pain in unspecified knee: Secondary | ICD-10-CM | POA: Insufficient documentation

## 2018-05-28 MED ORDER — AMLODIPINE BESYLATE 5 MG PO TABS: 5.0000 mg | ORAL_TABLET | Freq: Every day | ORAL | 1 refills | Status: DC

## 2018-05-28 MED ORDER — SERTRALINE HCL 50 MG PO TABS: 50.0000 mg | ORAL_TABLET | Freq: Every day | ORAL | 1 refills | Status: DC

## 2018-05-28 NOTE — Assessment & Plan Note (Signed)
2 week h/o intermittent near buckling of knees when first standing, has bilateral replacement x 14 years, refer to ortho

## 2018-05-28 NOTE — Patient Instructions (Signed)
F/U in 4 weeks, call if t you need me sooner  BP is high, new additional medication started is amlodipine 5 mg one every morning, this is sent to your local pharmacy  Yiou are referred to Dr Aline Brochure re knees  HBA1c, chem 7 and EGFR today

## 2018-05-28 NOTE — Progress Notes (Signed)
[name]     MRN: [id number]      DOB: 03-06-1946   HPI [name] is here for follow up and re-evaluation of chronic medical conditions, medication management and review of any available recent lab and radiology data.  Preventive health is updated, specifically  Cancer screening and Immunization.   Questions or concerns regarding consultations or procedures which the PT has had in the interim are  addressed. The PT denies any adverse reactions to current medications since the last visit.  2 week h/o unsteady on her feet especially when just standing, and feels as though knees will start to buckle, has had bilateral knee replacement  ROS Denies recent fever or chills. Denies sinus pressure, nasal congestion, ear pain or sore throat. Denies chest congestion, productive cough or wheezing. Denies chest pains, palpitations and leg swelling Denies abdominal pain, nausea, vomiting,diarrhea or constipation.   Denies dysuria, frequency, hesitancy or incontinence.  Denies headaches, seizures, numbness, or tingling. Denies depression, anxiety or insomnia. Denies skin break down or rash.   PE  BP (!) 170/84   Pulse 76   Resp 16   Ht 5' (1.524 m)   Wt 204 lb (92.5 kg)   SpO2 96%   BMI 39.84 kg/m   Patient alert and oriented and in no cardiopulmonary distress.  HEENT: No facial asymmetry, EOMI,   oropharynx pink and moist.  Neck supple no JVD, no mass.  Chest: Clear to auscultation bilaterally.  CVS: S1, S2 no murmurs, no S3.Regular rate.  ABD: Soft non tender.   Ext: No edema  WU:[id number] though  Adequate ROM spine, shoulders, hips and knees.  Skin: Intact, no ulcerations or rash noted.  Psych: Good eye contact, normal affect. Memory intact not anxious or depressed appearing.  CNS: CN 2-12 intact, power,  normal throughout.no focal deficits noted.   Assessment & Plan  Intermittent knee pain 2 week h/o intermittent near buckling of knees when first standing, has  bilateral replacement x 14 years, refer to ortho  Essential hypertension Uncontrolled, amlodipine adsded, continue other medication  DASH diet and commitment to daily physical activity for a minimum of 30 minutes discussed and encouraged, as a part of hypertension management. The importance of attaining a healthy weight is also discussed.  BP/Weight 05/28/2018 03/16/2018 01/06/2018 09/01/2017 05/05/2017 03/12/2017 12/02/2016  Systolic BP 170 130 170 138 170 148 160  Diastolic BP 84 84 82 80 86 90 84  Wt. (Lbs) 204 199 201 197 202.5 197.12 196  BMI 39.84 38.86 39.26 38.47 39.55 38.5 38.28     F/u in 4 weeks  Hyperlipidemia LDL goal <100 Hyperlipidemia:Low fat diet discussed and encouraged.   Lipid Panel  Lab Results  Component Value Date   CHOL 177 12/29/2017   HDL 67 12/29/2017   LDLCALC 94 12/29/2017   TRIG 73 12/29/2017   CHOLHDL 2.6 12/29/2017   Controlled, no change in medication     Morbid obesity (HCC) Deteriorated. Patient re-educated about  the importance of commitment to a  minimum of 150 minutes of exercise per week.  The importance of healthy food choices with portion control discussed. Encouraged to start a food diary, count calories and to consider  joining a support group. Sample diet sheets offered. Goals set by the patient for the next several months.   Weight /BMI 05/28/2018 03/16/2018 01/06/2018  WEIGHT 204 lb 199 lb 201 lb  HEIGHT 5\' 0"  5\' 0"  5\' 0"   BMI 39.84 kg/m2 38.86 kg/m2 39.26 kg/m2  IGT (impaired glucose tolerance) Deteriorated Patient educated about the importance of limiting  Carbohydrate intake , the need to commit to daily physical activity for a minimum of 30 minutes , and to commit weight loss. The fact that changes in all these areas will reduce or eliminate all together the development of diabetes is stressed.   Diabetic Labs Latest Ref Rng & Units 05/28/2018 12/29/2017 07/02/2017 06/09/2017 11/25/2016  HbA1c <5.7 % of total Hgb 6.0(H)  - 5.7(H) - -  Microalbumin 0.00 - 1.89 mg/dL - - - - -  Micro/Creat Ratio 0.0 - 30.0 mg/g - - - - -  Chol <200 mg/dL - 161 - 096 045  HDL >40 mg/dL - 67 - 87 83  Calc LDL mg/dL (calc) - 94 - 95 99  Triglycerides <150 mg/dL - 73 - 61 66  Creatinine 0.60 - 0.93 mg/dL 9.81 1.91 - 4.78 2.95   BP/Weight 05/28/2018 03/16/2018 01/06/2018 09/01/2017 05/05/2017 03/12/2017 12/02/2016  Systolic BP 170 130 170 138 170 148 160  Diastolic BP 84 84 82 80 86 90 84  Wt. (Lbs) 204 199 201 197 202.5 197.12 196  BMI 39.84 38.86 39.26 38.47 39.55 38.5 38.28   Foot/eye exam completion dates 03/13/2010  Foot exam Order yes  Foot Form Completion -

## 2018-05-29 ENCOUNTER — Encounter: Payer: Self-pay | Admitting: Family Medicine

## 2018-05-29 LAB — BASIC METABOLIC PANEL WITH GFR
BUN: 12 mg/dL (ref 7–25)
CO2: 29 mmol/L (ref 20–32)
Calcium: 9.4 mg/dL (ref 8.6–10.4)
Chloride: 102 mmol/L (ref 98–110)
Creat: 0.72 mg/dL (ref 0.60–0.93)
GFR, EST AFRICAN AMERICAN: 98 mL/min/{1.73_m2} (ref >=60)
GFR, EST NON AFRICAN AMERICAN: 85 mL/min/{1.73_m2} (ref >=60)
Glucose, Bld: 103 mg/dL (ref 65–139)
Potassium: 4 mmol/L (ref 3.5–5.3)
Sodium: 142 mmol/L (ref 135–146)

## 2018-05-29 LAB — HEMOGLOBIN A1C
HEMOGLOBIN A1C: 6 %{Hb} — AB (ref <5.7)
Mean Plasma Glucose: 126 (calc)
eAG (mmol/L): 7 (calc)

## 2018-05-30 ENCOUNTER — Encounter: Payer: Self-pay | Admitting: Family Medicine

## 2018-05-30 NOTE — Assessment & Plan Note (Signed)
Deteriorated Patient educated about the importance of limiting  Carbohydrate intake , the need to commit to daily physical activity for a minimum of 30 minutes , and to commit weight loss. The fact that changes in all these areas will reduce or eliminate all together the development of diabetes is stressed.   Diabetic Labs Latest Ref Rng & Units 05/28/2018 12/29/2017 07/02/2017 06/09/2017 11/25/2016  HbA1c <5.7 % of total Hgb 6.0(H) - 5.7(H) - -  Microalbumin 0.00 - 1.89 mg/dL - - - - -  Micro/Creat Ratio 0.0 - 30.0 mg/g - - - - -  Chol <200 mg/dL - 161 - 096 045  HDL >40 mg/dL - 67 - 87 83  Calc LDL mg/dL (calc) - 94 - 95 99  Triglycerides <150 mg/dL - 73 - 61 66  Creatinine 0.60 - 0.93 mg/dL 9.81 1.91 - 4.78 2.95   BP/Weight 05/28/2018 03/16/2018 01/06/2018 09/01/2017 05/05/2017 03/12/2017 12/02/2016  Systolic BP 170 130 170 138 170 148 160  Diastolic BP 84 84 82 80 86 90 84  Wt. (Lbs) 204 199 201 197 202.5 197.12 196  BMI 39.84 38.86 39.26 38.47 39.55 38.5 38.28   Foot/eye exam completion dates 03/13/2010  Foot exam Order yes  Foot Form Completion -

## 2018-05-30 NOTE — Assessment & Plan Note (Signed)
Deteriorated. Patient re-educated about  the importance of commitment to a  minimum of 150 minutes of exercise per week.  The importance of healthy food choices with portion control discussed. Encouraged to start a food diary, count calories and to consider  joining a support group. Sample diet sheets offered. Goals set by the patient for the next several months.   Weight /BMI 05/28/2018 03/16/2018 01/06/2018  WEIGHT 204 lb 199 lb 201 lb  HEIGHT 5\' 0"  5\' 0"  5\' 0"   BMI 39.84 kg/m2 38.86 kg/m2 39.26 kg/m2

## 2018-05-30 NOTE — Assessment & Plan Note (Signed)
Hyperlipidemia:Low fat diet discussed and encouraged.   Lipid Panel  Lab Results  Component Value Date   CHOL 177 12/29/2017   HDL 67 12/29/2017   LDLCALC 94 12/29/2017   TRIG 73 12/29/2017   CHOLHDL 2.6 12/29/2017   Controlled, no change in medication

## 2018-05-30 NOTE — Assessment & Plan Note (Signed)
Uncontrolled, amlodipine adsded, continue other medication  DASH diet and commitment to daily physical activity for a minimum of 30 minutes discussed and encouraged, as a part of hypertension management. The importance of attaining a healthy weight is also discussed.  BP/Weight 05/28/2018 03/16/2018 01/06/2018 09/01/2017 05/05/2017 03/12/2017 12/02/2016  Systolic BP 170 130 170 138 170 148 160  Diastolic BP 84 84 82 80 86 90 84  Wt. (Lbs) 204 199 201 197 202.5 197.12 196  BMI 39.84 38.86 39.26 38.47 39.55 38.5 38.28     F/u in 4 weeks

## 2018-06-01 ENCOUNTER — Other Ambulatory Visit: Payer: Self-pay | Admitting: Family Medicine

## 2018-06-04 DIAGNOSIS — J984 Other disorders of lung: Secondary | ICD-10-CM | POA: Diagnosis not present

## 2018-06-19 ENCOUNTER — Other Ambulatory Visit: Payer: Self-pay | Admitting: Family Medicine

## 2018-06-25 ENCOUNTER — Encounter: Payer: Self-pay | Admitting: Family Medicine

## 2018-06-25 ENCOUNTER — Ambulatory Visit (INDEPENDENT_AMBULATORY_CARE_PROVIDER_SITE_OTHER): Payer: Medicare Other | Admitting: Family Medicine

## 2018-06-25 VITALS — BP 130/78 | HR 66 | Resp 12 | Ht 60.0 in | Wt 201.0 lb

## 2018-06-25 DIAGNOSIS — I10 Essential (primary) hypertension: Secondary | ICD-10-CM | POA: Diagnosis not present

## 2018-06-25 DIAGNOSIS — E785 Hyperlipidemia, unspecified: Secondary | ICD-10-CM

## 2018-06-25 DIAGNOSIS — E559 Vitamin D deficiency, unspecified: Secondary | ICD-10-CM | POA: Diagnosis not present

## 2018-06-25 DIAGNOSIS — G8929 Other chronic pain: Secondary | ICD-10-CM

## 2018-06-25 DIAGNOSIS — M25511 Pain in right shoulder: Secondary | ICD-10-CM | POA: Diagnosis not present

## 2018-06-25 DIAGNOSIS — R7301 Impaired fasting glucose: Secondary | ICD-10-CM

## 2018-06-25 MED ORDER — PREDNISONE 5 MG PO TABS: ORAL_TABLET | ORAL | 0 refills | Status: DC

## 2018-06-25 MED ORDER — AMLODIPINE BESYLATE 5 MG PO TABS: 5.0000 mg | ORAL_TABLET | Freq: Every day | ORAL | 3 refills | Status: DC

## 2018-06-25 MED ORDER — SERTRALINE HCL 100 MG PO TABS: 100.0000 mg | ORAL_TABLET | Freq: Every day | ORAL | 3 refills | Status: DC

## 2018-06-25 MED ORDER — METOPROLOL SUCCINATE ER 50 MG PO TB24: ORAL_TABLET | ORAL | 3 refills | Status: DC

## 2018-06-25 NOTE — Progress Notes (Signed)
   Driscilla GrammesDiana M Wagner     MRN: [id number]      DOB: 11/13/1945   HPI [name] is here for follow up and re-evaluation of chronic medical conditions, In particular hypertension, which was uncontrolled at last visit, medication management and review of any available recent lab and radiology data.  Preventive health is updated, specifically  Cancer screening and Immunization.   Questions or concerns regarding consultations or procedures which the PT has had in the interim are  addressed. The PT denies any adverse reactions to current medications since the last visit.  C/o increased and uncontrolled right shoulder pain with limitation in mobility, no recent trauma, still awaiting appr for her knbee pain ROS Denies recent fever or chills. Denies sinus pressure, nasal congestion, ear pain or sore throat. Denies chest congestion, productive cough or wheezing. Denies chest pains, palpitations and leg swelling Denies abdominal pain, nausea, vomiting,diarrhea or constipation.   Denies dysuria, frequency, hesitancy or incontinence. Denies headaches, seizures, numbness, or tingling. Denies depression, anxiety or insomnia. Denies skin break down or rash.   PE  BP 130/78 (BP Location: Left Arm, Patient Position: Sitting, Cuff Size: Large)   Pulse 66   Resp 12   Ht 5' (1.524 m)   Wt 201 lb 0.6 oz (91.2 kg)   SpO2 95% Comment: room air  BMI 39.26 kg/m   Patient alert and oriented and in no cardiopulmonary distress.  HEENT: No facial asymmetry, EOMI,   oropharynx pink and moist.  Neck decreased ROM, no JVD, no mass.  Chest: Clear to auscultation bilaterally.  CVS: S1, S2 no murmurs, no S3.Regular rate.  ABD: Soft non tender.   Ext: No edema  MS: Decreased  ROM spine, shoulders, hips and knees.  Skin: Intact, no ulcerations or rash noted.  Psych: Good eye contact, normal affect. Memory intact not anxious or depressed appearing.  CNS: CN 2-12 intact, power,  normal throughout.no focal  deficits noted.   Assessment & Plan  Essential hypertension Controlled, no change in medication DASH diet and commitment to daily physical activity for a minimum of 30 minutes discussed and encouraged, as a part of hypertension management. The importance of attaining a healthy weight is also discussed.  BP/Weight 06/25/2018 05/28/2018 03/16/2018 01/06/2018 09/01/2017 05/05/2017 03/12/2017  Systolic BP 130 170 130 170 138 170 148  Diastolic BP 78 84 84 82 80 86 90  Wt. (Lbs) 201.04 204 199 201 197 202.5 197.12  BMI 39.26 39.84 38.86 39.26 38.47 39.55 38.5       Morbid obesity (HCC) Slight improvement, obesity contributing to/linked with hypertension, osteoarthritis and depression and iGT Patient re-educated about  the importance of commitment to a  minimum of 150 minutes of exercise per week.  The importance of healthy food choices with portion control discussed. Encouraged to start a food diary, count calories and to consider  joining a support group. Sample diet sheets offered. Goals set by the patient for the next several months.   Weight /BMI 06/25/2018 05/28/2018 03/16/2018  WEIGHT 201 lb 0.6 oz 204 lb 199 lb  HEIGHT 5\' 0"  5\' 0"  5\' 0"   BMI 39.26 kg/m2 39.84 kg/m2 38.86 kg/m2      OSTEOARTHRITIS, KNEES, BILATERAL Ongoing pain, stiffness and instability, will call for appt with Orthopedics, she has already been referred  Chronic right shoulder pain Pain and reduced mobility of right shoulder , no inciting trauma, severe osteoarthritis in joint refer to orthopedics

## 2018-06-25 NOTE — Patient Instructions (Addendum)
F/U in 4 months, call if you need me before  Fasting lipid, cmp and EGFr, hBA1C to be drawn 1 week before April visit, collect order   You are referred to Dr Aline Brochure re right shoulder and we are calling for an appt date for the knees  Blood pressure Is good, stay on the same medication  Thank you  for choosing [facility]. We consider it a privelige to serve you.  Delivering excellent health care in a caring and  compassionate way is our goal.  Partnering with you,  so that together we can achieve this goal is our strategy.

## 2018-06-29 ENCOUNTER — Other Ambulatory Visit: Payer: Self-pay | Admitting: Family Medicine

## 2018-06-29 MED ORDER — METOPROLOL SUCCINATE ER 50 MG PO TB24: 50.0000 mg | ORAL_TABLET | Freq: Every day | ORAL | 3 refills | Status: DC

## 2018-06-29 NOTE — Progress Notes (Signed)
Metoprolol 50

## 2018-07-01 ENCOUNTER — Ambulatory Visit: Payer: Medicare Other | Admitting: Orthopaedic Surgery

## 2018-07-04 DIAGNOSIS — J984 Other disorders of lung: Secondary | ICD-10-CM | POA: Diagnosis not present

## 2018-07-05 ENCOUNTER — Encounter: Payer: Self-pay | Admitting: Family Medicine

## 2018-07-05 DIAGNOSIS — M25511 Pain in right shoulder: Secondary | ICD-10-CM

## 2018-07-05 DIAGNOSIS — G8929 Other chronic pain: Secondary | ICD-10-CM | POA: Insufficient documentation

## 2018-07-05 NOTE — Assessment & Plan Note (Signed)
Pain and reduced mobility of right shoulder , no inciting trauma, severe osteoarthritis in joint refer to orthopedics

## 2018-07-05 NOTE — Assessment & Plan Note (Signed)
Slight improvement, obesity contributing to/linked with hypertension, osteoarthritis and depression and iGT Patient re-educated about  the importance of commitment to a  minimum of 150 minutes of exercise per week.  The importance of healthy food choices with portion control discussed. Encouraged to start a food diary, count calories and to consider  joining a support group. Sample diet sheets offered. Goals set by the patient for the next several months.   Weight /BMI 06/25/2018 05/28/2018 03/16/2018  WEIGHT 201 lb 0.6 oz 204 lb 199 lb  HEIGHT 5\' 0"  5\' 0"  5\' 0"   BMI 39.26 kg/m2 39.84 kg/m2 38.86 kg/m2

## 2018-07-05 NOTE — Assessment & Plan Note (Signed)
Ongoing pain, stiffness and instability, will call for appt with Orthopedics, she has already been referred

## 2018-07-05 NOTE — Assessment & Plan Note (Signed)
Controlled, no change in medication DASH diet and commitment to daily physical activity for a minimum of 30 minutes discussed and encouraged, as a part of hypertension management. The importance of attaining a healthy weight is also discussed.  BP/Weight 06/25/2018 05/28/2018 03/16/2018 01/06/2018 09/01/2017 05/05/2017 03/12/2017  Systolic BP 130 170 130 170 138 170 148  Diastolic BP 78 84 84 82 80 86 90  Wt. (Lbs) 201.04 204 199 201 197 202.5 197.12  BMI 39.26 39.84 38.86 39.26 38.47 39.55 38.5

## 2018-07-23 ENCOUNTER — Ambulatory Visit (HOSPITAL_COMMUNITY)
Admission: RE | Admit: 2018-07-23 | Discharge: 2018-07-23 | Disposition: A | Discharge status: Home / Self Care | Payer: Medicare Other | Source: Ambulatory Visit | Attending: Family Medicine | Admitting: Family Medicine

## 2018-07-23 ENCOUNTER — Encounter: Payer: Self-pay | Admitting: Family Medicine

## 2018-07-23 ENCOUNTER — Ambulatory Visit (INDEPENDENT_AMBULATORY_CARE_PROVIDER_SITE_OTHER): Payer: Medicare Other | Admitting: Family Medicine

## 2018-07-23 VITALS — BP 138/78 | HR 80 | Resp 18 | Ht 60.0 in | Wt 200.4 lb

## 2018-07-23 DIAGNOSIS — I10 Essential (primary) hypertension: Secondary | ICD-10-CM | POA: Diagnosis not present

## 2018-07-23 DIAGNOSIS — R05 Cough: Secondary | ICD-10-CM | POA: Diagnosis not present

## 2018-07-23 DIAGNOSIS — J209 Acute bronchitis, unspecified: Secondary | ICD-10-CM | POA: Diagnosis not present

## 2018-07-23 MED ORDER — IPRATROPIUM BROMIDE 0.02 % IN SOLN
0.5000 mg | Freq: Once | RESPIRATORY_TRACT | Status: AC
  Administered 2018-07-23: 0.5 mg via RESPIRATORY_TRACT

## 2018-07-23 MED ORDER — ALBUTEROL SULFATE (2.5 MG/3ML) 0.083% IN NEBU
2.5000 mg | INHALATION_SOLUTION | Freq: Once | RESPIRATORY_TRACT | Status: AC
  Administered 2018-07-23: 2.5 mg via RESPIRATORY_TRACT

## 2018-07-23 MED ORDER — PROMETHAZINE-DM 6.25-15 MG/5ML PO SYRP: ORAL_SOLUTION | ORAL | 0 refills | Status: DC

## 2018-07-23 MED ORDER — PREDNISONE 5 MG PO TABS: ORAL_TABLET | ORAL | 0 refills | Status: AC

## 2018-07-23 MED ORDER — CEFTRIAXONE SODIUM 500 MG IJ SOLR
500.0000 mg | Freq: Once | INTRAMUSCULAR | Status: AC
  Administered 2018-07-23: 500 mg via INTRAMUSCULAR

## 2018-07-23 MED ORDER — BENZONATATE 100 MG PO CAPS: 100.0000 mg | ORAL_CAPSULE | Freq: Two times a day (BID) | ORAL | 0 refills | Status: DC | PRN

## 2018-07-23 MED ORDER — PENICILLIN V POTASSIUM 500 MG PO TABS: 500.0000 mg | ORAL_TABLET | Freq: Three times a day (TID) | ORAL | 0 refills | Status: DC

## 2018-07-23 NOTE — Assessment & Plan Note (Addendum)
Duoneb, Rocephin and depo medrol 80 mg iM in office followed by pen v , tessalon perle, pheneragn dm and cXR

## 2018-07-23 NOTE — Patient Instructions (Addendum)
F//u as before, call if you need me sooner  You are treated for acute bronchitis.  Medciations are sent to [facility] in [place]  Please get CXR at hospital today  Breathing treatment , depo medrol , Rocephin a, penicillin and prednisone are preescribed and administered also phenergan dM  Hope that you improve completely soon.    Acute Bronchitis, Adult Acute bronchitis is when air tubes (bronchi) in the lungs suddenly get swollen. The condition can make it hard to breathe. It can also cause these symptoms:  A cough.  Coughing up clear, yellow, or green mucus.  Wheezing.  Chest congestion.  Shortness of breath.  A fever.  Body aches.  Chills.  A sore throat. Follow these instructions at home:  Medicines  Take over-the-counter and prescription medicines only as told by your doctor.  If you were prescribed an antibiotic medicine, take it as told by your doctor. Do not stop taking the antibiotic even if you start to feel better. General instructions  Rest.  Drink enough fluids to keep your pee (urine) pale yellow.  Avoid smoking and secondhand smoke. If you smoke and you need help quitting, ask your doctor. Quitting will help your lungs heal faster.  Use an inhaler, cool mist vaporizer, or humidifier as told by your doctor.  Keep all follow-up visits as told by your doctor. This is important. How is this prevented? To lower your risk of getting this condition again:  Wash your hands often with soap and water. If you cannot use soap and water, use hand sanitizer.  Avoid contact with people who have cold symptoms.  Try not to touch your hands to your mouth, nose, or eyes.  Make sure to get the flu shot every year. Contact a doctor if:  Your symptoms do not get better in 2 weeks. Get help right away if:  You cough up blood.  You have chest pain.  You have very bad shortness of breath.  You become dehydrated.  You faint (pass out) or keep  feeling like you are going to pass out.  You keep throwing up (vomiting).  You have a very bad headache.  Your fever or chills gets worse. This information is not intended to replace advice given to you by your health care provider. Make sure you discuss any questions you have with your health care provider. Document Released: 12/25/2007 Document Revised: 02/19/2017 Document Reviewed: 12/27/2015 Elsevier Interactive Patient Education  2019 [employer].

## 2018-07-23 NOTE — Progress Notes (Signed)
   [name]     MRN: [id number]      DOB: [birth year]   HPI [name] is here with a 5 day h/o chest congestion with thick yeloow green sputum, has had chills also SOB, she denies head congestion or sinus drainage She has  Had chills , and possible low grade fever  ROS Denies chest pains, palpitations and leg swelling Denies abdominal pain, nausea, vomiting,diarrhea or constipation.   Denies dysuria, frequency, hesitancy or incontinence. Denies joint pain, swelling and limitation in mobility. Denies headaches, seizures, numbness, or tingling. Denies uncontrolled  depression,c/o anxiety denies  insomnia. Denies skin break down or rash.   PE  BP 138/78 (BP Location: Left Arm, Patient Position: Sitting, Cuff Size: Normal)   Pulse 80   Resp 18   Ht 5' (1.524 m)   Wt 200 lb 6.4 oz (90.9 kg)   SpO2 100% Comment: room air  BMI 39.14 kg/m   Patient alert and oriented and in no cardiopulmonary distress.  HEENT: No facial asymmetry, EOMI,   oropharynx pink and moist.  Neck supple no JVD, no mass.no sinus tenderness , no cervical adenitis, TM clear bilaterally  Chest: Decreased air entry, scattered crackles and wheezes CVS: S1, S2 no murmurs, no S3.Regular rate.  ABD: Soft non tender.   Ext: No edema  MS: Adequate  Though reduced  ROM spine, shoulders, hips and knees.  Skin: Intact, no ulcerations or rash noted.  Psych: Good eye contact, normal affect. Memory intact not anxious or depressed appearing.  CNS: CN 2-12 intact, power,  normal throughout.no focal deficits noted.   Assessment & Plan  Acute bronchitis Duoneb, Rocephin and depo medrol 80 mg iM in office followed by pen v , tessalon perle, pheneragn dm and cXR  Essential hypertension Controlled, no change in medication DASH diet and commitment to daily physical activity for a minimum of 30 minutes discussed and encouraged, as a part of hypertension management. The importance of attaining a healthy weight is  also discussed.  BP/Weight 07/23/2018 06/25/2018 05/28/2018 03/16/2018 01/06/2018 09/01/2017 05/05/2017  Systolic BP 138 130 170 130 170 138 170  Diastolic BP 78 78 84 84 82 80 86  Wt. (Lbs) 200.4 201.04 204 199 201 197 202.5  BMI 39.14 39.26 39.84 38.86 39.26 38.47 39.55       Morbid obesity (HCC) Unchanged Patient re-educated about  the importance of commitment to a  minimum of 150 minutes of exercise per week.  The importance of healthy food choices with portion control discussed. Encouraged to start a food diary, count calories and to consider  joining a support group. Sample diet sheets offered. Goals set by the patient for the next several months.   Weight /BMI 07/23/2018 06/25/2018 05/28/2018  WEIGHT 200 lb 6.4 oz 201 lb 0.6 oz 204 lb  HEIGHT 5\' 0"  5\' 0"  5\' 0"   BMI 39.14 kg/m2 39.26 kg/m2 39.84 kg/m2

## 2018-07-25 ENCOUNTER — Encounter: Payer: Self-pay | Admitting: Family Medicine

## 2018-07-25 NOTE — Assessment & Plan Note (Signed)
Controlled, no change in medication DASH diet and commitment to daily physical activity for a minimum of 30 minutes discussed and encouraged, as a part of hypertension management. The importance of attaining a healthy weight is also discussed.  BP/Weight 07/23/2018 06/25/2018 05/28/2018 03/16/2018 01/06/2018 09/01/2017 05/05/2017  Systolic BP 138 130 170 130 170 138 170  Diastolic BP 78 78 84 84 82 80 86  Wt. (Lbs) 200.4 201.04 204 199 201 197 202.5  BMI 39.14 39.26 39.84 38.86 39.26 38.47 39.55

## 2018-07-25 NOTE — Assessment & Plan Note (Signed)
Unchanged Patient re-educated about  the importance of commitment to a  minimum of 150 minutes of exercise per week.  The importance of healthy food choices with portion control discussed. Encouraged to start a food diary, count calories and to consider  joining a support group. Sample diet sheets offered. Goals set by the patient for the next several months.   Weight /BMI 07/23/2018 06/25/2018 05/28/2018  WEIGHT 200 lb 6.4 oz 201 lb 0.6 oz 204 lb  HEIGHT 5\' 0"  5\' 0"  5\' 0"   BMI 39.14 kg/m2 39.26 kg/m2 39.84 kg/m2

## 2018-07-27 ENCOUNTER — Ambulatory Visit (INDEPENDENT_AMBULATORY_CARE_PROVIDER_SITE_OTHER): Payer: Medicare Other

## 2018-07-27 ENCOUNTER — Encounter: Payer: Self-pay | Admitting: Orthopedic Surgery

## 2018-07-27 ENCOUNTER — Ambulatory Visit: Payer: Medicare Other | Admitting: Orthopedic Surgery

## 2018-07-27 VITALS — BP 164/84 | HR 74 | Ht 60.0 in | Wt 200.0 lb

## 2018-07-27 DIAGNOSIS — M5442 Lumbago with sciatica, left side: Secondary | ICD-10-CM

## 2018-07-27 DIAGNOSIS — M25511 Pain in right shoulder: Secondary | ICD-10-CM

## 2018-07-27 DIAGNOSIS — M5441 Lumbago with sciatica, right side: Secondary | ICD-10-CM | POA: Diagnosis not present

## 2018-07-27 DIAGNOSIS — M25512 Pain in left shoulder: Secondary | ICD-10-CM

## 2018-07-27 DIAGNOSIS — M4317 Spondylolisthesis, lumbosacral region: Secondary | ICD-10-CM | POA: Diagnosis not present

## 2018-07-27 DIAGNOSIS — Z96653 Presence of artificial knee joint, bilateral: Secondary | ICD-10-CM

## 2018-07-27 DIAGNOSIS — G8929 Other chronic pain: Secondary | ICD-10-CM

## 2018-07-27 MED ORDER — IBUPROFEN 600 MG PO TABS: 600.0000 mg | ORAL_TABLET | Freq: Four times a day (QID) | ORAL | 2 refills | Status: DC | PRN

## 2018-07-27 NOTE — Progress Notes (Signed)
NEW PATIENT OFFICE VISIT  Chief Complaint  Patient presents with  . Knee Pain    Bilateral knee and back pain, referred by Dr. Lodema HongSimpson.    73 year old female presents for evaluation of her back  She presents with lower back pain radiating to both legs she has had that for 5 to 6 months no treatment  No prior surgeries are noted  She has a dull aching pain located in the lower portion of her back it is severe at times but primarily mild to moderate is associated with giving way symptoms of both lower extremities  She has had bilateral total knees successfully     Review of Systems  Respiratory: Positive for cough and shortness of breath.   Musculoskeletal: Positive for back pain. Negative for neck pain.       Shoulder pain  Neurological: Negative for tingling, sensory change, speech change, focal weakness and weakness.  All other systems reviewed and are negative.  No Known Allergies   Past Medical History:  Diagnosis Date  . Asthma   . Back pain   . Depression   . Diverticulosis of colon   . GERD (gastroesophageal reflux disease)   . Hyperlipidemia   . Hypertension   . Obesity   . Osteoarthrosis, unspecified whether generalized or localized, lower leg    bilateral knees   . Pulmonary hypertension (HCC)     Past Surgical History:  Procedure Laterality Date  . ABDOMINAL HYSTERECTOMY    . COLONOSCOPY  2005   Dr. Jerolyn ShinLeroy Smith:numerous large scattered diverticula  . COLONOSCOPY N/A 11/15/2013   Dr. Darrick PennaFields: moderate diverticula, small internal hemorrhoids, redudant colon. Next TCS in 2025 with overtube.   . ESOPHAGOGASTRODUODENOSCOPY (EGD) WITH ESOPHAGEAL DILATION N/A 11/15/2013   Dr. 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 moderate erosive gastritis, negative H.pylori  . TOTAL KNEE ARTHROPLASTY  06/01/04   left / Dr. Romeo AppleHArrison  . TOTAL KNEE ARTHROPLASTY  11/29/03   right / Dr. Romeo AppleHarrison  . TUBAL LIGATION      Family History  Problem Relation Age of Onset   . Aneurysm Father   . Diabetes Father   . Diabetes Mother   . Hypertension Mother   . Heart disease Mother   . Hypertension Brother   . Diabetes Brother   . Diabetes Brother   . Hypertension Brother   . Colon cancer Maternal Uncle   . Hypertension Son    Social History   Tobacco Use  . Smoking status: Never Smoker  . Smokeless tobacco: Never Used  Substance Use Topics  . Alcohol use: No  . Drug use: No    No Known Allergies  Current Meds  Medication Sig  . alendronate (FOSAMAX) 70 MG tablet TAKE 1 TABLET BY MOUTH  EVERY 7 DAYS WITH A FULL  GLASS OF WATER ON AN EMPTY  STOMACH  . amLODipine (NORVASC) 5 MG tablet Take 1 tablet (5 mg total) by mouth daily.  Marland Kitchen. aspirin (ASPIRIN LOW DOSE) 81 MG EC tablet Take 81 mg by mouth daily. Take one tablet by mouth once a day   . atorvastatin (LIPITOR) 20 MG tablet TAKE 1 TABLET BY MOUTH EVERY DAY  . benzonatate (TESSALON) 100 MG capsule Take 1 capsule (100 mg total) by mouth 2 (two) times daily as needed for cough.  . Calcium Carbonate-Vit D-Min (CALCIUM 600 + MINERALS) 600-200 MG-UNIT TABS Take 1 tablet by mouth 3 (three) times daily.   Marland Kitchen. gabapentin (NEURONTIN) 300 MG capsule TAKE 1 CAPSULE BY  MOUTH AT  BEDTIME  . losartan-hydrochlorothiazide (HYZAAR) 100-12.5 MG tablet TAKE 1 TABLET BY MOUTH DAILY  . metoprolol succinate (TOPROL-XL) 50 MG 24 hr tablet Take 1 tablet (50 mg total) by mouth daily. Take with or immediately following a meal.  . omeprazole (PRILOSEC) 20 MG capsule TAKE 1 CAPSULE BY MOUTH  DAILY  . penicillin v potassium (VEETID) 500 MG tablet Take 1 tablet (500 mg total) by mouth 3 (three) times daily.  . predniSONE (DELTASONE) 5 MG tablet Take one tablet two times daily for 5 days  . PROAIR HFA 108 (90 Base) MCG/ACT inhaler USE 2 PUFFS INTO LUNGS EVERY 6 HOURS AS NEEDED FOR WHEEZING OR SHORTNESS OF BREATH  . promethazine-dextromethorphan (PROMETHAZINE-DM) 6.25-15 MG/5ML syrup One teaspoon at bedtime, as needed, for excessive  cough  . pyridOXINE (VITAMIN B-6) 100 MG tablet Take 200 mg by mouth daily.  . sertraline (ZOLOFT) 100 MG tablet Take 1 tablet (100 mg total) by mouth daily.  . sildenafil (REVATIO) 20 MG tablet Take 60 mg by mouth daily.     BP (!) 164/84   Pulse 74   Ht 5' (1.524 m)   Wt 200 lb (90.7 kg)   BMI 39.06 kg/m   Physical Exam Vitals signs and nursing note reviewed.  Constitutional:      General: She is not in acute distress.    Appearance: She is not ill-appearing.     Comments: Normal development moderate obesity endomorphic body habitus well-groomed  Cardiovascular:     Comments: I do not see any major swelling or varicosities in the lower extremities temperature and normal with no tenderness Lymphadenopathy:     Lower Body: No right inguinal adenopathy. No left inguinal adenopathy.  Skin:    Comments: Cervical lumbar and thoracic spine normal both shoulders normal both lower extremities normal  Neurological:     Coordination: Coordination is intact.     Deep Tendon Reflexes: Babinski sign absent on the right side. Babinski sign absent on the left side.     Reflex Scores:      Patellar reflexes are 2+ on the right side and 2+ on the left side.      Achilles reflexes are 2+ on the right side and 2+ on the left side.    Comments: Normal sensation in all 4 extremities  Oriented x3 mood and affect normal no depression     Ortho Exam  Left and right shoulder nontender with full range of motion both shoulders are stable normal strength and muscle tone  Both lower extremities to total knee incisions are noted knee flexion in both knees is greater than 100 degrees minimal contracture no tenderness or swelling excellent strength quadriceps 5 out of 5 muscle tone normal and stability tests were normal  Lumbar spine was tender again as noted  MEDICAL DECISION SECTION  Xrays were done at [facility]   Separate dictation.  Summation of that report lumbar spondylosis and L5 on S1 grade 1  spondylolisthesis    Encounter Diagnoses  Name Primary?  . Chronic bilateral low back pain with bilateral sciatica   . Spondylolisthesis at L5-S1 level Yes  . Pain of both shoulder joints   . Status post total bilateral knee replacement     PLAN: (Rx., injectx, surgery, frx, mri/ct) Both total knees are functioning well. Shoulder pain is asymptomatic today and there was no evidence of rotator cuff tear  See recommendations for spondylolisthesis and bilateral lower back pain below  Current Outpatient Medications:  .  alendronate (FOSAMAX) 70 MG tablet, TAKE 1 TABLET BY MOUTH  EVERY 7 DAYS WITH A FULL  GLASS OF WATER ON AN EMPTY  STOMACH, Disp: 12 tablet, Rfl: 1 .  amLODipine (NORVASC) 5 MG tablet, Take 1 tablet (5 mg total) by mouth daily., Disp: 90 tablet, Rfl: 3 .  aspirin (ASPIRIN LOW DOSE) 81 MG EC tablet, Take 81 mg by mouth daily. Take one tablet by mouth once a day , Disp: , Rfl:  .  atorvastatin (LIPITOR) 20 MG tablet, TAKE 1 TABLET BY MOUTH EVERY DAY, Disp: 90 tablet, Rfl: 1 .  benzonatate (TESSALON) 100 MG capsule, Take 1 capsule (100 mg total) by mouth 2 (two) times daily as needed for cough., Disp: 20 capsule, Rfl: 0 .  Calcium Carbonate-Vit D-Min (CALCIUM 600 + MINERALS) 600-200 MG-UNIT TABS, Take 1 tablet by mouth 3 (three) times daily. , Disp: , Rfl:  .  gabapentin (NEURONTIN) 300 MG capsule, TAKE 1 CAPSULE BY MOUTH AT  BEDTIME, Disp: 90 capsule, Rfl: 1 .  losartan-hydrochlorothiazide (HYZAAR) 100-12.5 MG tablet, TAKE 1 TABLET BY MOUTH DAILY, Disp: 30 tablet, Rfl: 3 .  metoprolol succinate (TOPROL-XL) 50 MG 24 hr tablet, Take 1 tablet (50 mg total) by mouth daily. Take with or immediately following a meal., Disp: 90 tablet, Rfl: 3 .  omeprazole (PRILOSEC) 20 MG capsule, TAKE 1 CAPSULE BY MOUTH  DAILY, Disp: 90 capsule, Rfl: 1 .  penicillin v potassium (VEETID) 500 MG tablet, Take 1 tablet (500 mg total) by mouth 3 (three) times daily., Disp: 30 tablet, Rfl: 0 .  predniSONE  (DELTASONE) 5 MG tablet, Take one tablet two times daily for 5 days, Disp: 10 tablet, Rfl: 0 .  PROAIR HFA 108 (90 Base) MCG/ACT inhaler, USE 2 PUFFS INTO LUNGS EVERY 6 HOURS AS NEEDED FOR WHEEZING OR SHORTNESS OF BREATH, Disp: 8.5 Inhaler, Rfl: 0 .  promethazine-dextromethorphan (PROMETHAZINE-DM) 6.25-15 MG/5ML syrup, One teaspoon at bedtime, as needed, for excessive cough, Disp: 240 mL, Rfl: 0 .  pyridOXINE (VITAMIN B-6) 100 MG tablet, Take 200 mg by mouth daily., Disp: , Rfl:  .  sertraline (ZOLOFT) 100 MG tablet, Take 1 tablet (100 mg total) by mouth daily., Disp: 90 tablet, Rfl: 3 .  sildenafil (REVATIO) 20 MG tablet, Take 60 mg by mouth daily. , Disp: , Rfl:  .  ibuprofen (ADVIL,MOTRIN) 600 MG tablet, Take 1 tablet (600 mg total) by mouth every 6 (six) hours as needed., Disp: 60 tablet, Rfl: 2  Rec:  PT nsaid  Meds ordered this encounter  Medications  . ibuprofen (ADVIL,MOTRIN) 600 MG tablet    Sig: Take 1 tablet (600 mg total) by mouth every 6 (six) hours as needed.    Dispense:  60 tablet    Refill:  2    Fuller CanadaStanley Reia Viernes, MD  07/27/2018 5:10 PM

## 2018-07-27 NOTE — Patient Instructions (Addendum)
Treatment for your problem: 1. Physical Therapy 2. Arthritis medication   Spondylolisthesis  Spondylolisthesis is when one of the bones in the spine (vertebra) slips forward and out of place. This commonly occurs in the lower back (lumbar spine), but it can happen anywhere along the spine. What are the causes? This condition may be caused by:  A break or crack (stress fracture) in a bone in the spine from doing sports or physical activities that: ? Put a lot of strain on the bones in the lower back. ? Involve repetitive overstretching (hyperextension) of the spine.  Injury (trauma) from an accident.  Wear and tear that happens as a person grows older. What increases the risk? The following factors may make you more likely to develop this condition:  Participating in sports or activities such as: ? Gymnastics. ? Figure skating. ? Weight lifting. ? Football.  Having a condition that affects the bones, such as osteoarthritis or cancer.  Being overweight. What are the signs or symptoms? Symptoms of this condition may include:  Mild to severe pain in the legs, lower back, or buttocks.  An abnormal way of walking (abnormal gait).  Poor posture.  Muscle stiffness, specifically in the hamstrings. The hamstrings are in the backs of the thighs.  Weakness, numbness, or a tingling sensation in the legs.  Neck pain, if the injury is at the top of the spine. Symptoms may get worse when standing, and they may temporarily get better when sitting down or bending forward. In some cases, there may be no symptoms of this condition. How is this diagnosed? This condition may be diagnosed based on:  Your symptoms.  Your medical history.  A physical exam.  Imaging tests, such as: ? X-rays. ? CT scan. ? MRI. How is this treated? This condition may be treated by:  Resting.  Pain medicines.  NSAIDs, like ibuprofen, to help reduce swelling and discomfort.  Injections of medicine  (cortisone) in your back. These injections can help to relieve pain and numbness.  A brace to stabilize and support your back.  Physical therapy. You may work with an occupational therapist or physical therapist who can teach you how to reduce pressure on your back while you do everyday activities.  Surgery. This may be needed if: ? Other treatment methods do not improve your condition. ? Your symptoms do not go away after 3-6 months. ? You have changes in control of your stool or urine. ? You are unable to walk or stand. ? You have severe pain. Follow these instructions at home: Medicines  Take over-the-counter and prescription medicines only as told by your health care provider.  Ask your health care provider if the medicine prescribed to you: ? Requires you to avoid driving or using heavy machinery. ? Can cause constipation. You may need to take these actions to prevent or treat constipation:  Drink enough fluid to keep your urine pale yellow.  Take over-the-counter or prescription medicines.  Eat foods that are high in fiber, such as beans, whole grains, and fresh fruits and vegetables.  Limit foods that are high in fat and processed sugars, such as fried or sweet foods. If you have a brace:  Wear the brace as told by your health care provider. Remove it only as told by your health care provider.  Keep the brace clean.  If the brace is not waterproof: ? Do not let it get wet. ? Cover it with a watertight covering when you take a bath  or a shower. Activity  Rest and return to your normal activities as told by your health care provider. Ask your health care provider what activities are safe for you.  Ask your health care provider when it is safe to drive if you have a back brace.  Work with a physical therapist to make a safe exercise program, as recommended by your health care provider. Do exercises as told by your physical therapist. This may include exercises to  strengthen your back and abdominal muscles (core exercises). Managing pain, stiffness, and swelling      If directed, put ice on the affected area. ? If you have a removable brace, remove it as told by your health care provider. ? Put ice in a plastic bag. ? Place a towel between your skin and the bag. ? Leave the ice on for 20 minutes, 2-3 times a day.  If directed, apply heat to the affected area as often as told by your health care provider. Use the heat source that your health care provider recommends, such as a moist heat pack or a heating pad. ? If you have a removable brace, remove it as told by your health care provider. ? Place a towel between your skin and the heat source. ? Leave the heat on for 20-30 minutes. ? Remove the heat if your skin turns bright red. This is especially important if you are unable to feel pain, heat, or cold. You may have a greater risk of getting burned. General instructions  Do not use any products that contain nicotine or tobacco, such as cigarettes, e-cigarettes, and chewing tobacco. These can delay bone healing. If you need help quitting, ask your health care provider.  If you are overweight, work with your health care provider and a dietitian to set a weight-loss goal that is healthy and reasonable for you.  Keep all follow-up visits as told by your health care provider. This is important. Contact a health care provider if:  You have pain that gets worse or does not get better. Get help right away if:  You have severe back or neck pain.  You have changes in control of your stool or urine.  You develop weakness or numbness in your legs.  You are unable to stand or walk. Summary  Spondylolisthesis is when one of the bones in the spine (vertebra) slips forward and out of place.  This condition may be treated with rest, medicines, wearing a brace, physical therapy, or surgery.  Rest and return to your normal activities as told by your  health care provider. Ask your health care provider what activities are safe for you.  Contact a health care provider if you have pain that gets worse or does not get better. This information is not intended to replace advice given to you by your health care provider. Make sure you discuss any questions you have with your health care provider. Document Released: 07/08/2005 Document Revised: 02/10/2018 Document Reviewed: 02/10/2018 Elsevier Interactive Patient Education  2019 [employer].

## 2018-08-04 DIAGNOSIS — J984 Other disorders of lung: Secondary | ICD-10-CM | POA: Diagnosis not present

## 2018-08-12 ENCOUNTER — Encounter (HOSPITAL_COMMUNITY): Payer: Self-pay

## 2018-08-12 ENCOUNTER — Ambulatory Visit (HOSPITAL_COMMUNITY): Payer: Medicare Other | Attending: Orthopedic Surgery

## 2018-08-12 ENCOUNTER — Other Ambulatory Visit: Payer: Self-pay

## 2018-08-12 DIAGNOSIS — M5441 Lumbago with sciatica, right side: Secondary | ICD-10-CM | POA: Insufficient documentation

## 2018-08-12 DIAGNOSIS — G8929 Other chronic pain: Secondary | ICD-10-CM | POA: Diagnosis not present

## 2018-08-12 DIAGNOSIS — R2689 Other abnormalities of gait and mobility: Secondary | ICD-10-CM | POA: Diagnosis not present

## 2018-08-12 DIAGNOSIS — R29898 Other symptoms and signs involving the musculoskeletal system: Secondary | ICD-10-CM

## 2018-08-12 DIAGNOSIS — M6281 Muscle weakness (generalized): Secondary | ICD-10-CM | POA: Diagnosis not present

## 2018-08-12 DIAGNOSIS — M5442 Lumbago with sciatica, left side: Secondary | ICD-10-CM | POA: Diagnosis not present

## 2018-08-12 NOTE — Patient Instructions (Signed)
Body mechanics for dishes with foot on inner cabinet      - and peeling potatoes and apples from a higher surface using a bucket  Flexors, Supine Bridge    Lie supine, feet shoulder-width apart. Lift hips toward ceiling. Hold 2-3___ seconds. Repeat _10-15__ times per session. Do _1__ sessions per day.  Copyright  VHI. All rights reserved.

## 2018-08-12 NOTE — Therapy (Signed)
[facility] [address] [place], [place], [address] Phone: [phone]   Fax:  [phone]  Physical Therapy Evaluation  Patient Details  Name: [name] MRN: [id number] Date of Birth: [birth year] Referring Provider (PT): Fuller Canada, MD   Encounter Date: 08/12/2018  PT End of Session - 08/12/18 1212    Visit Number  1    Number of Visits  12    Date for PT Re-Evaluation  09/23/18   mini reassessment 09/21/10/2020   Authorization Type  [employer] Medicare $35 copay combined with PT/SP for each visit No dedcutible; visits are based on medical necessity; no auth. required    Authorization Time Period  cert 71/62/9528 - 09/23/2018    Authorization - Visit Number  1    Authorization - Number of Visits  10    PT Start Time  1120    PT Stop Time  1202    PT Time Calculation (min)  42 min    Activity Tolerance  Patient tolerated treatment well    Behavior During Therapy  WFL for tasks assessed/performed       Past Medical History:  Diagnosis Date  . Asthma   . Back pain   . Depression   . Diverticulosis of colon   . GERD (gastroesophageal reflux disease)   . Hyperlipidemia   . Hypertension   . Obesity   . Osteoarthrosis, unspecified whether generalized or localized, lower leg    bilateral knees   . Pulmonary hypertension (HCC)     Past Surgical History:  Procedure Laterality Date  . ABDOMINAL HYSTERECTOMY    . COLONOSCOPY  2005   Dr. Jerolyn Shin Smith:numerous large scattered diverticula  . COLONOSCOPY N/A 11/15/2013   Dr. Darrick Penna: moderate diverticula, small internal hemorrhoids, redudant colon. Next TCS in 2025 with overtube.   . ESOPHAGOGASTRODUODENOSCOPY (EGD) WITH ESOPHAGEAL DILATION N/A 11/15/2013   Dr. 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 moderate erosive gastritis, negative H.pylori  . TOTAL KNEE ARTHROPLASTY  06/01/04   left / Dr. Romeo Apple  . TOTAL KNEE ARTHROPLASTY  11/29/03   right / Dr. Romeo Apple  . TUBAL  LIGATION      There were no vitals filed for this visit.   Subjective Assessment - 08/12/18 1133    Subjective  Over the last 3-4 months has been struggling with LBP and pain into both legs at the same time. Has had both knees replaced but doctor said knees are fine and pain is coming from her back.  Patient currenlty participating in aquatics 3x/wk - it helps her knees but not her back pain.    Pertinent History  GERD, HTN, diet controlled DM, HLD, Bil TKA    Limitations  Lifting;Sitting;House hold activities    How long can you sit comfortably?  can sit a couple hours but might struggle to get up and might feel like she is going to loose her balance. Household activities - needs to sit and rest frequently or modify the activity like sitting on the bathtub to clean it.     Diagnostic tests  spondylolisthesis at L5-S1    Patient Stated Goals  To make sure my back is better.     Currently in Pain?  Yes    Pain Score  7     Pain Location  Back    Pain Orientation  Medial;Lower;Posterior    Pain Descriptors / Indicators  Throbbing    Pain Type  Chronic pain    Pain Radiating  Towards  both hips down to knees/thighs, sometimes goes to ankles but can't recall where exactly because it isn't frequent.     Pain Onset  More than a month ago    Aggravating Factors   sitting too long, standing to wash dishes, washing the bathtub, standing too long    Pain Relieving Factors  sitting down    Effect of Pain on Daily Activities  limits         [facility] - 08/12/18 0001      Assessment   Medical Diagnosis  Chronic bilateral low back pain with bilateral sciatica    Referring Provider (PT)  Fuller CanadaHarrison, Stanley, MD    Onset Date/Surgical Date  04/21/18    Hand Dominance  Right    Next MD Visit  April 2020    Prior Therapy  None      Precautions   Precautions  None      Restrictions   Weight Bearing Restrictions  No      Balance Screen   Has the patient fallen in the past 6 months  No     Has the patient had a decrease in activity level because of a fear of falling?   Yes    Is the patient reluctant to leave their home because of a fear of falling?   No      Prior Function   Level of Independence  Independent    Vocation  Retired    Leisure  aquatic therapy, [employer], crotchet, quilting      Cognition   Overall Cognitive Status  Within Functional Limits for tasks assessed      Observation/Other Assessments   Focus on Therapeutic Outcomes (FOTO)   57% limited      ROM / Strength   AROM / PROM / Strength  AROM;Strength      AROM   AROM Assessment Site  Lumbar    Lumbar Flexion  WNl    Lumbar Extension  WNL    Lumbar - Right Side Bend  WNL    Lumbar - Left Side Bend  WNL    Lumbar - Right Rotation  WNL    Lumbar - Left Rotation  WNL      Strength   Strength Assessment Site  Hip;Knee;Ankle    Right/Left Hip  Right;Left    Right Hip Flexion  4+/5    Right Hip Extension  4-/5    Right Hip ABduction  4-/5    Left Hip Flexion  4-/5    Left Hip Extension  4-/5    Left Hip ABduction  4-/5    Right/Left Knee  Right;Left    Right Knee Flexion  4/5    Right Knee Extension  4+/5    Left Knee Flexion  4-/5    Left Knee Extension  4/5    Right/Left Ankle  Right;Left    Right Ankle Dorsiflexion  4-/5    Left Ankle Dorsiflexion  4/5      Transfers   Five time sit to stand comments   16.48      Ambulation/Gait   Ambulation Distance (Feet)  40 Feet    Assistive device  None    Gait Pattern  Step-through pattern;Decreased stride length;Decreased stance time - left;Decreased stance time - right;Lateral trunk lean to right;Lateral trunk lean to left;Antalgic    Ambulation Surface  Level;Indoor                Objective measurements completed  on examination: See above findings.      [facility] Adult PT Treatment/Exercise - 08/12/18 0001      Posture/Postural Control   Posture Comments  discussed body mechanics for peeling potatoes sitting and washing  dishes standing      Exercises   Exercises  Lumbar      Lumbar Exercises: Supine   Bridge  10 reps             PT Education - 08/12/18 1212    Education Details  Examination findings and plan of care.    Person(s) Educated  Patient    Methods  Explanation;Demonstration;Handout    Comprehension  Verbalized understanding;Need further instruction       PT Short Term Goals - 08/12/18 1228      PT SHORT TERM GOAL #1   Title  Patient will be independent in performance of initial HEP for carry over for pain control and additional strengthening to decreased pain and increase function.    Time  2    Period  Weeks    Status  New    Target Date  08/26/18      PT SHORT TERM GOAL #2   Title  Patient will exhibit improved body mechanics in sitting and standing to perform activities at shoulder level and below.     Time  3    Period  Weeks    Status  New    Target Date  09/02/18      PT SHORT TERM GOAL #3   Title  Patient will exhibit 1/2 grade MMT improvement in bilateral LE to indicate decreased stresses to LBP to decrease pain and improve functional abilities.     Baseline  *see MMT    Time  3    Period  Weeks    Status  New        PT Long Term Goals - 08/12/18 1231      PT LONG TERM GOAL #1   Title  Patient will be independent in performance of advanced HEP for carry over for pain control and additional strengthening after DC from PT.    Time  6    Period  Weeks    Status  New    Target Date  09/23/18      PT LONG TERM GOAL #2   Title  Patient will exhibit 4+/5 or >  MMT in bilateral LE to indicate decreased stresses to LBP to decrease pain and improve functional abilities.     Baseline  *see MMT    Time  6    Period  Weeks    Status  New      PT LONG TERM GOAL #3   Title  Patient will exhibit a 10% improvement in FOTO score indicating an improvement in perceived functional abilities.    Baseline  initial _ FOTO 57% limited    Time  6    Period  Weeks     Status  New      PT LONG TERM GOAL #4   Title  Patient will exhibit improved body mechanics in sitting and standing to perform activities at shoulder level and above and for lifting smaller objects from a lower surface.     Time  6    Period  Weeks    Status  New             Plan - 08/12/18 1217    Clinical Impression Statement  Patient is a 72 year  old female presenting to outpatient physical therapy with complaints of low back pain and bilateral sciatica over the past 3-4 months. Patient has also had bilateral TKAs in the past. Patient has been diagnosed with spondylolisthesis at L5-S1. Patient exhibits functional limitations, decreased strength, WNL lumbar spine AROM, and gait abnormalities. Patient will benefit from physical therapy to address the aforementioned deficits and improve patient's QOL.    History and Personal Factors relevant to plan of care:  GERD, HTN, diet controlled DM, HLD, Bil TKA    Clinical Presentation  Stable    Clinical Presentation due to:  FOTO, 5xSTS, MMT, gait, clinical judgement    Clinical Decision Making  Low    Rehab Potential  Fair    Clinical Impairments Affecting Rehab Potential  positive - motivated to move, willing to learn; negative - spondylolisthesis L5-S1, body composition    PT Frequency  2x / week    PT Duration  6 weeks   Patient also participating in aquatics fitness class 3x/wk   PT Treatment/Interventions  Gait training;DME Instruction;Stair training;Functional mobility training;Therapeutic activities;Therapeutic exercise;Balance training;Neuromuscular re-education;Patient/family education;Manual techniques;Passive range of motion;Dry needling;Energy conservation;Taping;Joint Manipulations;Spinal Manipulations    PT Next Visit Plan  reciew goals, eval and HEP, body mechanics; begin ab set and mat core stab and LE strengthening exercises; assess SLS and 2MWT    PT Home Exercise Plan  body mechanics washing dishes and sitting to peel  potatoes/apples, supine bridge    Consulted and Agree with Plan of Care  Patient       Patient will benefit from skilled therapeutic intervention in order to improve the following deficits and impairments:  Abnormal gait, Difficulty walking, Decreased activity tolerance, Pain, Improper body mechanics, Decreased strength  Visit Diagnosis: Chronic bilateral low back pain with bilateral sciatica  Muscle weakness (generalized)  Other abnormalities of gait and mobility  Other symptoms and signs involving the musculoskeletal system     Problem List Patient Active Problem List   Diagnosis Date Noted  . Acute bronchitis 07/23/2018  . Chronic right shoulder pain 07/05/2018  . Intermittent knee pain 05/28/2018  . Vitamin D deficiency 11/22/2015  . Back pain with left-sided radiculopathy 09/15/2013  . Carpal tunnel syndrome on left 07/31/2011  . IGT (impaired glucose tolerance) 10/16/2007  . Hyperlipidemia LDL goal <100 10/16/2007  . Morbid obesity (HCC) 10/16/2007  . Anxiety and depression 10/16/2007  . Essential hypertension 10/16/2007  . Pulmonary hypertension (HCC) 10/16/2007  . GERD 10/16/2007  . DIVERTICULOSIS OF COLON 10/16/2007  . OSTEOARTHRITIS, KNEES, 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    Katina DungBarbara D. Hartnett-Rands, MS, PT Per [facility]ador[facility] PT [id number] 08/12/2018, 12:34 PM  [facility] [address] [place], [place], [address] Phone: [phone]   Fax:  [phone]  Name: Driscilla GrammesDiana M Muckey MRN: [id number] Date of Birth: 08/09/1945

## 2018-08-13 ENCOUNTER — Ambulatory Visit (HOSPITAL_COMMUNITY): Payer: Medicare Other | Admitting: Physical Therapy

## 2018-08-13 ENCOUNTER — Encounter (HOSPITAL_COMMUNITY): Payer: Self-pay | Admitting: Physical Therapy

## 2018-08-13 DIAGNOSIS — G8929 Other chronic pain: Secondary | ICD-10-CM | POA: Diagnosis not present

## 2018-08-13 DIAGNOSIS — M5442 Lumbago with sciatica, left side: Principal | ICD-10-CM

## 2018-08-13 DIAGNOSIS — M6281 Muscle weakness (generalized): Secondary | ICD-10-CM

## 2018-08-13 DIAGNOSIS — M5441 Lumbago with sciatica, right side: Secondary | ICD-10-CM | POA: Diagnosis not present

## 2018-08-13 DIAGNOSIS — R29898 Other symptoms and signs involving the musculoskeletal system: Secondary | ICD-10-CM | POA: Diagnosis not present

## 2018-08-13 DIAGNOSIS — R2689 Other abnormalities of gait and mobility: Secondary | ICD-10-CM | POA: Diagnosis not present

## 2018-08-13 NOTE — Therapy (Signed)
[name] [address] [place], [place], [address] Phone: [phone]   Fax:  [phone]  Physical Therapy Treatment  Patient Details  Name: [name] MRN: [id number] Date of Birth: [birth year] Referring Provider (PT): Fuller CanadaHarrison, Stanley, MD   Encounter Date: 08/13/2018  PT End of Session - 08/13/18 1121    Visit Number  2    Number of Visits  12    Date for PT Re-Evaluation  09/23/18   mini reassessment 09/21/10/2020   Authorization Type  [employer] Medicare $35 copay combined with PT/SP for each visit No dedcutible; visits are based on medical necessity; no auth. required    Authorization Time Period  cert 69/62/952801/22/2020 - 09/23/2018    Authorization - Visit Number  2    Authorization - Number of Visits  10    PT Start Time  1116    PT Stop Time  1203    PT Time Calculation (min)  47 min    Activity Tolerance  Patient tolerated treatment well    Behavior During Therapy  WFL for tasks assessed/performed       Past Medical History:  Diagnosis Date  . Asthma   . Back pain   . Depression   . Diverticulosis of colon   . GERD (gastroesophageal reflux disease)   . Hyperlipidemia   . Hypertension   . Obesity   . Osteoarthrosis, unspecified whether generalized or localized, lower leg    bilateral knees   . Pulmonary hypertension (HCC)     Past Surgical History:  Procedure Laterality Date  . ABDOMINAL HYSTERECTOMY    . COLONOSCOPY  2005   Dr. Jerolyn ShinLeroy Smith:numerous large scattered diverticula  . COLONOSCOPY N/A 11/15/2013   Dr. Darrick PennaFields: moderate diverticula, small internal hemorrhoids, redudant colon. Next TCS in 2025 with overtube.   . ESOPHAGOGASTRODUODENOSCOPY (EGD) WITH ESOPHAGEAL DILATION N/A 11/15/2013   Dr. 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 moderate erosive gastritis, negative H.pylori  . TOTAL KNEE ARTHROPLASTY  06/01/04   left / Dr. Romeo AppleHArrison  . TOTAL KNEE ARTHROPLASTY  11/29/03   right / Dr. Romeo AppleHarrison  . TUBAL  LIGATION      There were no vitals filed for this visit.  Subjective Assessment - 08/13/18 1119    Subjective  Patient reported that she is not having pain today. She stated that she has not had a chance to do her exercises yet.     Pertinent History  GERD, HTN, diet controlled DM, HLD, Bil TKA    Limitations  Lifting;Sitting;House hold activities    How long can you sit comfortably?  can sit a couple hours but might struggle to get up and might feel like she is going to loose her balance. Household activities - needs to sit and rest frequently or modify the activity like sitting on the bathtub to clean it.     Diagnostic tests  spondylolisthesis at L5-S1    Patient Stated Goals  To make sure my back is better.     Currently in Pain?  No/denies         [facility] Assessment - 08/13/18 0001      Ambulation/Gait   Ambulation Distance (Feet)  346 Feet   2MWT   Assistive device  None    Gait Pattern  Step-through pattern;Decreased stride length;Decreased stance time - left;Decreased stance time - right;Lateral trunk lean to right;Lateral trunk lean to left;Antalgic    Ambulation Surface  Level;Indoor  Balance   Balance Assessed  Yes      Static Standing Balance   Static Standing - Balance Support  No upper extremity supported    Static Standing Balance -  Activities   Single Leg Stance - Right Leg;Single Leg Stance - Left Leg    Static Standing - Comment/# of Minutes  Right 3 seconds; Left 2 seconds                   [facility] Adult PT Treatment/Exercise - 08/13/18 0001      Lumbar Exercises: Supine   Ab Set  20 reps    AB Set Limitations  5'' holds     Pelvic Tilt  20 reps    Pelvic Tilt Limitations  Posterior; with verbal and tactile cues for form    Bridge  15 reps    Bridge Limitations  2-3'' holds      Lumbar Exercises: Sidelying   Clam  Right;Left;20 reps;3 seconds             PT Education - 08/13/18 1121    Education Details  Reviewed goals and  HEP.     Person(s) Educated  Patient    Methods  Explanation    Comprehension  Verbalized understanding       PT Short Term Goals - 08/13/18 1122      PT SHORT TERM GOAL #1   Title  Patient will be independent in performance of initial HEP for carry over for pain control and additional strengthening to decreased pain and increase function.    Time  2    Period  Weeks    Status  On-going      PT SHORT TERM GOAL #2   Title  Patient will exhibit improved body mechanics in sitting and standing to perform activities at shoulder level and below.     Time  3    Period  Weeks    Status  On-going      PT SHORT TERM GOAL #3   Title  Patient will exhibit 1/2 grade MMT improvement in bilateral LE to indicate decreased stresses to LBP to decrease pain and improve functional abilities.     Baseline  *see MMT    Time  3    Period  Weeks    Status  On-going        PT Long Term Goals - 08/13/18 1124      PT LONG TERM GOAL #1   Title  Patient will be independent in performance of advanced HEP for carry over for pain control and additional strengthening after DC from PT.    Time  6    Period  Weeks    Status  On-going      PT LONG TERM GOAL #2   Title  Patient will exhibit 4+/5 or >  MMT in bilateral LE to indicate decreased stresses to LBP to decrease pain and improve functional abilities.     Baseline  *see MMT    Time  6    Period  Weeks    Status  On-going      PT LONG TERM GOAL #3   Title  Patient will exhibit a 10% improvement in FOTO score indicating an improvement in perceived functional abilities.    Baseline  initial _ FOTO 57% limited    Time  6    Period  Weeks    Status  On-going      PT LONG TERM GOAL #4  Title  Patient will exhibit improved body mechanics in sitting and standing to perform activities at shoulder level and above and for lifting smaller objects from a lower surface.     Time  6    Period  Weeks    Status  On-going            Plan -  08/13/18 1152    Clinical Impression Statement  This session began by reviewing patient's evaluation and goals. Then, had patient perform SLS and as requested by evaluating therapist. Then progressed patient with core stabilization exercises and lower extremity strengthening exercises. Patient required frequent verbal cues and tactile cues for form in order to perform exercises. Patient would benefit from continued skilled physical therapy in order to continue progressing the patient towards functional goals.     Rehab Potential  Fair    Clinical Impairments Affecting Rehab Potential  positive - motivated to move, willing to learn; negative - spondylolisthesis L5-S1, body composition    PT Frequency  2x / week    PT Duration  6 weeks   Patient also participating in aquatics fitness class 3x/wk   PT Treatment/Interventions  Gait training;DME Instruction;Stair training;Functional mobility training;Therapeutic activities;Therapeutic exercise;Balance training;Neuromuscular re-education;Patient/family education;Manual techniques;Passive range of motion;Dry needling;Energy conservation;Taping;Joint Manipulations;Spinal Manipulations    PT Next Visit Plan  Review compliance with HEP. body mechanics; Continue ab set and mat core stab and LE strengthening exercises; Progress to standing functional lower extremity strengthening as able including functional squats and lunges    PT Home Exercise Plan  body mechanics washing dishes and sitting to peel potatoes/apples, supine bridge; 08/13/18: Abdominal set 20x 5'' holds 1x/day    Consulted and Agree with Plan of Care  Patient       Patient will benefit from skilled therapeutic intervention in order to improve the following deficits and impairments:  Abnormal gait, Difficulty walking, Decreased activity tolerance, Pain, Improper body mechanics, Decreased strength  Visit Diagnosis: Chronic bilateral low back pain with bilateral sciatica  Muscle weakness  (generalized)     Problem List Patient Active Problem List   Diagnosis Date Noted  . Acute bronchitis 07/23/2018  . Chronic right shoulder pain 07/05/2018  . Intermittent knee pain 05/28/2018  . Vitamin D deficiency 11/22/2015  . Back pain with left-sided radiculopathy 09/15/2013  . Carpal tunnel syndrome on left 07/31/2011  . IGT (impaired glucose tolerance) 10/16/2007  . Hyperlipidemia LDL goal <100 10/16/2007  . Morbid obesity (HCC) 10/16/2007  . Anxiety and depression 10/16/2007  . Essential hypertension 10/16/2007  . Pulmonary hypertension (HCC) 10/16/2007  . GERD 10/16/2007  . DIVERTICULOSIS OF COLON 10/16/2007  . OSTEOARTHRITIS, KNEES, 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   Verne Carrow PT, DPT 12:07 PM, 08/13/18 [phone]  [facility] [address] [place], [place], [address] Phone: [phone]   Fax:  [phone]  Name: TEJUANA BITTER MRN: [id number] Date of Birth: Jan 22, 1946

## 2018-08-13 NOTE — Patient Instructions (Signed)
Abdominal Set    With knees bent, hands on upper thighs, inhale deeply. Then while exhaling, tighten abdominals or stomach muscles hold that position for 5 seconds and then relax (Do not lift head as shown in the picture) Repeat _20___ times per set. Do _1___ set per session. Do __7__ sessions per week.  Copyright  VHI. All rights reserved.

## 2018-08-19 ENCOUNTER — Ambulatory Visit (HOSPITAL_COMMUNITY): Payer: Medicare Other

## 2018-08-19 ENCOUNTER — Encounter (HOSPITAL_COMMUNITY): Payer: Self-pay

## 2018-08-19 DIAGNOSIS — M6281 Muscle weakness (generalized): Secondary | ICD-10-CM | POA: Diagnosis not present

## 2018-08-19 DIAGNOSIS — R2689 Other abnormalities of gait and mobility: Secondary | ICD-10-CM

## 2018-08-19 DIAGNOSIS — M5441 Lumbago with sciatica, right side: Principal | ICD-10-CM

## 2018-08-19 DIAGNOSIS — R29898 Other symptoms and signs involving the musculoskeletal system: Secondary | ICD-10-CM | POA: Diagnosis not present

## 2018-08-19 DIAGNOSIS — G8929 Other chronic pain: Secondary | ICD-10-CM

## 2018-08-19 DIAGNOSIS — M5442 Lumbago with sciatica, left side: Secondary | ICD-10-CM | POA: Diagnosis not present

## 2018-08-19 NOTE — Therapy (Signed)
[facility] [address] [place], [place], [address] Phone: [phone]   Fax:  [phone]  Physical Therapy Treatment  Patient Details  Name: [name] MRN: [id number] Date of Birth: [birth year] Referring Provider (PT): Fuller CanadaHarrison, Stanley, MD   Encounter Date: 08/19/2018  PT End of Session - 08/19/18 1613    Visit Number  3    Number of Visits  12    Date for PT Re-Evaluation  09/23/18   Minireassess 09/02/18   Authorization Type  [employer] Medicare $35 copay combined with PT/SP for each visit No dedcutible; visits are based on medical necessity; no auth. required    Authorization Time Period  cert 69/62/952801/22/2020 - 09/23/2018    Authorization - Visit Number  3    Authorization - Number of Visits  10    PT Start Time  1604    PT Stop Time  1642    PT Time Calculation (min)  38 min    Activity Tolerance  Patient tolerated treatment well    Behavior During Therapy  WFL for tasks assessed/performed       Past Medical History:  Diagnosis Date  . Asthma   . Back pain   . Depression   . Diverticulosis of colon   . GERD (gastroesophageal reflux disease)   . Hyperlipidemia   . Hypertension   . Obesity   . Osteoarthrosis, unspecified whether generalized or localized, lower leg    bilateral knees   . Pulmonary hypertension (HCC)     Past Surgical History:  Procedure Laterality Date  . ABDOMINAL HYSTERECTOMY    . COLONOSCOPY  2005   Dr. Jerolyn ShinLeroy Smith:numerous large scattered diverticula  . COLONOSCOPY N/A 11/15/2013   Dr. Darrick PennaFields: moderate diverticula, small internal hemorrhoids, redudant colon. Next TCS in 2025 with overtube.   . ESOPHAGOGASTRODUODENOSCOPY (EGD) WITH ESOPHAGEAL DILATION N/A 11/15/2013   Dr. 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 moderate erosive gastritis, negative H.pylori  . TOTAL KNEE ARTHROPLASTY  06/01/04   left / Dr. Romeo AppleHArrison  . TOTAL KNEE ARTHROPLASTY  11/29/03   right / Dr. Romeo AppleHarrison  . TUBAL LIGATION       There were no vitals filed for this visit.  Subjective Assessment - 08/19/18 1605    Subjective  Pt satted her back is feeling okay today, stated pain scale 5/10 lower back.  Reports she has began to be more aware of posture while completeing tasks in kitchen    Pertinent History  GERD, HTN, diet controlled DM, HLD, Bil TKA    Patient Stated Goals  To make sure my back is better.     Currently in Pain?  Yes    Pain Score  5     Pain Location  Back    Pain Orientation  Lower    Pain Descriptors / Indicators  Aching    Pain Type  Chronic pain    Pain Onset  More than a month ago    Pain Frequency  Intermittent    Aggravating Factors   sitting too long, standing to wash dishes, washing the bathtub, standing too long    Pain Relieving Factors  sitting down    Effect of Pain on Daily Activities  limits                       [facility] Adult PT Treatment/Exercise - 08/19/18 0001      Lumbar Exercises: Standing   Heel Raises  10 reps  Heel Raises Limitations  toe raises     Functional Squats  10 reps    Functional Squats Limitations  front of chair for mechanics, chair taps    Other Standing Lumbar Exercises  SLS 3x 10" with 1 HHA    Other Standing Lumbar Exercises  hip abd 10x BLE      Lumbar Exercises: Seated   Sit to Stand  10 reps    Sit to Stand Limitations  no HHA, eccentric control      Lumbar Exercises: Supine   Ab Set  10 reps;5 seconds;Limitations    AB Set Limitations  verbal and tactile cueing; count outloud to continue breathing; 5" holds    Bent Knee Raise  10 reps;3 seconds    Bent Knee Raise Limitations  with ab set    Bridge  10 reps    Bridge Limitations  2 sets 2-3'' holds    Straight Leg Raise  10 reps    Straight Leg Raises Limitations  with ab set      Lumbar Exercises: Sidelying   Clam  Right;Left;20 reps;3 seconds    Clam Limitations  RTB               PT Short Term Goals - 08/13/18 1122      PT SHORT TERM GOAL #1   Title   Patient will be independent in performance of initial HEP for carry over for pain control and additional strengthening to decreased pain and increase function.    Time  2    Period  Weeks    Status  On-going      PT SHORT TERM GOAL #2   Title  Patient will exhibit improved body mechanics in sitting and standing to perform activities at shoulder level and below.     Time  3    Period  Weeks    Status  On-going      PT SHORT TERM GOAL #3   Title  Patient will exhibit 1/2 grade MMT improvement in bilateral LE to indicate decreased stresses to LBP to decrease pain and improve functional abilities.     Baseline  *see MMT    Time  3    Period  Weeks    Status  On-going        PT Long Term Goals - 08/13/18 1124      PT LONG TERM GOAL #1   Title  Patient will be independent in performance of advanced HEP for carry over for pain control and additional strengthening after DC from PT.    Time  6    Period  Weeks    Status  On-going      PT LONG TERM GOAL #2   Title  Patient will exhibit 4+/5 or >  MMT in bilateral LE to indicate decreased stresses to LBP to decrease pain and improve functional abilities.     Baseline  *see MMT    Time  6    Period  Weeks    Status  On-going      PT LONG TERM GOAL #3   Title  Patient will exhibit a 10% improvement in FOTO score indicating an improvement in perceived functional abilities.    Baseline  initial _ FOTO 57% limited    Time  6    Period  Weeks    Status  On-going      PT LONG TERM GOAL #4   Title  Patient will exhibit improved body mechanics in  sitting and standing to perform activities at shoulder level and above and for lifting smaller objects from a lower surface.     Time  6    Period  Weeks    Status  On-going            Plan - 08/19/18 1639    Clinical Impression Statement  Reviewed compliance with HEP, pt able to verbalize exercises she's completeing at home.  Pt required multimodal cueing to correctly activate  abdominal correctly, neuromusculature training complete to improve contraction.  Progressed gluteal and abdominal strengthening wiht standing exercises with some cueing for form and posture awareness.  EOS pt was fatigued wiht activities, no reports of pain.    Rehab Potential  Fair    Clinical Impairments Affecting Rehab Potential  positive - motivated to move, willing to learn; negative - spondylolisthesis L5-S1, body composition    PT Frequency  2x / week    PT Duration  6 weeks   Patient also participating in aquatics fitness class 3x/wk   PT Treatment/Interventions  Gait training;DME Instruction;Stair training;Functional mobility training;Therapeutic activities;Therapeutic exercise;Balance training;Neuromuscular re-education;Patient/family education;Manual techniques;Passive range of motion;Dry needling;Energy conservation;Taping;Joint Manipulations;Spinal Manipulations    PT Next Visit Plan  Continue ab set and mat core stab and LE strengthening exercises; Progress to standing functional lower extremity strengthening as able including functional squats and lunges    PT Home Exercise Plan  body mechanics washing dishes and sitting to peel potatoes/apples, supine bridge; 08/13/18: Abdominal set 20x 5'' holds 1x/day       Patient will benefit from skilled therapeutic intervention in order to improve the following deficits and impairments:  Abnormal gait, Difficulty walking, Decreased activity tolerance, Pain, Improper body mechanics, Decreased strength  Visit Diagnosis: Chronic bilateral low back pain with bilateral sciatica  Muscle weakness (generalized)  Other abnormalities of gait and mobility  Other symptoms and signs involving the musculoskeletal system     Problem List Patient Active Problem List   Diagnosis Date Noted  . Acute bronchitis 07/23/2018  . Chronic right shoulder pain 07/05/2018  . Intermittent knee pain 05/28/2018  . Vitamin D deficiency 11/22/2015  . Back pain  with left-sided radiculopathy 09/15/2013  . Carpal tunnel syndrome on left 07/31/2011  . IGT (impaired glucose tolerance) 10/16/2007  . Hyperlipidemia LDL goal <100 10/16/2007  . Morbid obesity (HCC) 10/16/2007  . Anxiety and depression 10/16/2007  . Essential hypertension 10/16/2007  . Pulmonary hypertension (HCC) 10/16/2007  . GERD 10/16/2007  . DIVERTICULOSIS OF COLON 10/16/2007  . OSTEOARTHRITIS, KNEES, 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   Becky Sax, LPTA; CBIS [phone]  Juel Burrow 08/19/2018, 4:44 PM  [facility] [address] [place], [place], [address] Phone: [phone]   Fax:  [phone]  Name: [name] MRN: [id number] Date of Birth: [birth year]

## 2018-08-20 ENCOUNTER — Encounter (HOSPITAL_COMMUNITY): Payer: Self-pay

## 2018-08-20 ENCOUNTER — Ambulatory Visit (HOSPITAL_COMMUNITY): Payer: Medicare Other

## 2018-08-20 DIAGNOSIS — M5441 Lumbago with sciatica, right side: Principal | ICD-10-CM

## 2018-08-20 DIAGNOSIS — G8929 Other chronic pain: Secondary | ICD-10-CM

## 2018-08-20 DIAGNOSIS — R29898 Other symptoms and signs involving the musculoskeletal system: Secondary | ICD-10-CM | POA: Diagnosis not present

## 2018-08-20 DIAGNOSIS — M5442 Lumbago with sciatica, left side: Principal | ICD-10-CM

## 2018-08-20 DIAGNOSIS — M6281 Muscle weakness (generalized): Secondary | ICD-10-CM

## 2018-08-20 DIAGNOSIS — R2689 Other abnormalities of gait and mobility: Secondary | ICD-10-CM | POA: Diagnosis not present

## 2018-08-20 NOTE — Therapy (Signed)
[facility] [address] [place], [place], [address] Phone: [phone]   Fax:  [phone]  Physical Therapy Treatment  Patient Details  Name: [name] MRN: [id number] Date of Birth: April 24, 1946 Referring Provider (PT): Fuller Canada, MD   Encounter Date: 08/20/2018  PT End of Session - 08/20/18 1747    Visit Number  4    Number of Visits  12    Date for PT Re-Evaluation  09/23/18    Authorization Type  [facility] Medicare $35 copay combined with PT/SP for each visit No dedcutible; visits are based on medical necessity; no auth. required    Authorization Time Period  cert 62/22/9798 - 09/23/2018    Authorization - Visit Number  4    Authorization - Number of Visits  10    PT Start Time  1734    PT Stop Time  1812    PT Time Calculation (min)  38 min    Activity Tolerance  Patient tolerated treatment well;No increased pain;Patient limited by fatigue    Behavior During Therapy  [facility] for tasks assessed/performed       Past Medical History:  Diagnosis Date  . Asthma   . Back pain   . Depression   . Diverticulosis of colon   . GERD (gastroesophageal reflux disease)   . Hyperlipidemia   . Hypertension   . Obesity   . Osteoarthrosis, unspecified whether generalized or localized, lower leg    bilateral knees   . Pulmonary hypertension (HCC)     Past Surgical History:  Procedure Laterality Date  . ABDOMINAL HYSTERECTOMY    . COLONOSCOPY  2005   Dr. Jerolyn Shin Smith:numerous large scattered diverticula  . COLONOSCOPY N/A 11/15/2013   Dr. Darrick Penna: moderate diverticula, small internal hemorrhoids, redudant colon. Next TCS in 2025 with overtube.   . ESOPHAGOGASTRODUODENOSCOPY (EGD) WITH ESOPHAGEAL DILATION N/A 11/15/2013   Dr. 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 moderate erosive gastritis, negative H.pylori  . TOTAL KNEE ARTHROPLASTY  06/01/04   left / Dr. Romeo Apple  . TOTAL KNEE ARTHROPLASTY  11/29/03   right / Dr. Romeo Apple   . TUBAL LIGATION      There were no vitals filed for this visit.  Subjective Assessment - 08/20/18 1738    Subjective  Pt stated she had some back pain earlier this morning, has gotten better through out the day.  Current pain scale 2/10.  Reports her brother is giving her a table that can adjust in height and assist with folding    Patient Stated Goals  To make sure my back is better.     Currently in Pain?  Yes    Pain Score  2     Pain Location  Back    Pain Orientation  Lower    Pain Descriptors / Indicators  Sore;Aching;Nagging    Pain Type  Chronic pain    Pain Onset  More than a month ago    Pain Frequency  Intermittent    Aggravating Factors   sitting too long, standing to wash dishes, washing the bathtub, standing too long    Pain Relieving Factors  sitting down    Effect of Pain on Daily Activities  limits                       [facility] Adult PT Treatment/Exercise - 08/20/18 0001      Lumbar Exercises: Standing   Heel Raises  15 reps  Heel Raises Limitations  toe raises     Functional Squats  10 reps    Functional Squats Limitations  front of chair for mechanics, chair taps    Other Standing Lumbar Exercises  SLS 5x Lt 13", Rt 13" max    Other Standing Lumbar Exercises  hip abd 10x BLE      Lumbar Exercises: Supine   Ab Set  10 reps;5 seconds;Limitations    AB Set Limitations  verbal and tactile cueing; count outloud to continue breathing; 5" holds    Pelvic Tilt  20 reps    Pelvic Tilt Limitations  Posterior; with verbal and tactile cues for form    Bent Knee Raise  10 reps;3 seconds    Bent Knee Raise Limitations  with ab set    Bridge  10 reps    Bridge Limitations  2 sets 2-3'' holds      Lumbar Exercises: Sidelying   Clam  Right;Left;20 reps;3 seconds    Clam Limitations  RTB               PT Short Term Goals - 08/13/18 1122      PT SHORT TERM GOAL #1   Title  Patient will be independent in performance of initial HEP for carry  over for pain control and additional strengthening to decreased pain and increase function.    Time  2    Period  Weeks    Status  On-going      PT SHORT TERM GOAL #2   Title  Patient will exhibit improved body mechanics in sitting and standing to perform activities at shoulder level and below.     Time  3    Period  Weeks    Status  On-going      PT SHORT TERM GOAL #3   Title  Patient will exhibit 1/2 grade MMT improvement in bilateral LE to indicate decreased stresses to LBP to decrease pain and improve functional abilities.     Baseline  *see MMT    Time  3    Period  Weeks    Status  On-going        PT Long Term Goals - 08/13/18 1124      PT LONG TERM GOAL #1   Title  Patient will be independent in performance of advanced HEP for carry over for pain control and additional strengthening after DC from PT.    Time  6    Period  Weeks    Status  On-going      PT LONG TERM GOAL #2   Title  Patient will exhibit 4+/5 or >  MMT in bilateral LE to indicate decreased stresses to LBP to decrease pain and improve functional abilities.     Baseline  *see MMT    Time  6    Period  Weeks    Status  On-going      PT LONG TERM GOAL #3   Title  Patient will exhibit a 10% improvement in FOTO score indicating an improvement in perceived functional abilities.    Baseline  initial _ FOTO 57% limited    Time  6    Period  Weeks    Status  On-going      PT LONG TERM GOAL #4   Title  Patient will exhibit improved body mechanics in sitting and standing to perform activities at shoulder level and above and for lifting smaller objects from a lower surface.  Time  6    Period  Weeks    Status  On-going            Plan - 08/20/18 1751    Clinical Impression Statement  Session focus on improving abdominal strengthening and proximal strengthening.  Pt continues to require moderate cueing for proper abdominal contraction, tendency to hold breath rather than activate core.  Progressed  standing exercises with noted improved SLS compared to last session.  Therapist facilitaoitn for proper mechanics especially with squats and posture with LE movements.  No reports of pain, was limited by fatigue wiht activities.    Rehab Potential  Fair    Clinical Impairments Affecting Rehab Potential  positive - motivated to move, willing to learn; negative - spondylolisthesis L5-S1, body composition    PT Frequency  2x / week    PT Duration  6 weeks    PT Treatment/Interventions  Gait training;DME Instruction;Stair training;Functional mobility training;Therapeutic activities;Therapeutic exercise;Balance training;Neuromuscular re-education;Patient/family education;Manual techniques;Passive range of motion;Dry needling;Energy conservation;Taping;Joint Manipulations;Spinal Manipulations    PT Next Visit Plan  Continue ab set and mat core stab and LE strengthening exercises; Progress to standing functional lower extremity strengthening as able including functional squats and lunges    PT Home Exercise Plan  body mechanics washing dishes and sitting to peel potatoes/apples, supine bridge; 08/13/18: Abdominal set 20x 5'' holds 1x/day       Patient will benefit from skilled therapeutic intervention in order to improve the following deficits and impairments:  Abnormal gait, Difficulty walking, Decreased activity tolerance, Pain, Improper body mechanics, Decreased strength  Visit Diagnosis: Chronic bilateral low back pain with bilateral sciatica  Muscle weakness (generalized)  Other abnormalities of gait and mobility  Other symptoms and signs involving the musculoskeletal system     Problem List Patient Active Problem List   Diagnosis Date Noted  . Acute bronchitis 07/23/2018  . Chronic right shoulder pain 07/05/2018  . Intermittent knee pain 05/28/2018  . Vitamin D deficiency 11/22/2015  . Back pain with left-sided radiculopathy 09/15/2013  . Carpal tunnel syndrome on left 07/31/2011  .  IGT (impaired glucose tolerance) 10/16/2007  . Hyperlipidemia LDL goal <100 10/16/2007  . Morbid obesity (HCC) 10/16/2007  . Anxiety and depression 10/16/2007  . Essential hypertension 10/16/2007  . Pulmonary hypertension (HCC) 10/16/2007  . GERD 10/16/2007  . DIVERTICULOSIS OF COLON 10/16/2007  . OSTEOARTHRITIS, KNEES, 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   Becky Saxasey Jabarri Stefanelli, LPTA; CBIS [phone]  Juel BurrowCockerham, Ahyan Kreeger Jo 08/20/2018, 6:15 PM  [facility] [address] [place], [place], [address] Phone: [phone]   Fax:  [phone]  Name: [name] MRN: [id number] Date of Birth: [birth year]

## 2018-08-25 ENCOUNTER — Ambulatory Visit (HOSPITAL_COMMUNITY): Payer: Medicare Other | Attending: Orthopedic Surgery

## 2018-08-25 ENCOUNTER — Encounter (HOSPITAL_COMMUNITY): Payer: Self-pay

## 2018-08-25 DIAGNOSIS — R29898 Other symptoms and signs involving the musculoskeletal system: Secondary | ICD-10-CM | POA: Insufficient documentation

## 2018-08-25 DIAGNOSIS — M5442 Lumbago with sciatica, left side: Secondary | ICD-10-CM | POA: Insufficient documentation

## 2018-08-25 DIAGNOSIS — M5441 Lumbago with sciatica, right side: Secondary | ICD-10-CM | POA: Insufficient documentation

## 2018-08-25 DIAGNOSIS — R2689 Other abnormalities of gait and mobility: Secondary | ICD-10-CM

## 2018-08-25 DIAGNOSIS — G8929 Other chronic pain: Secondary | ICD-10-CM

## 2018-08-25 DIAGNOSIS — M6281 Muscle weakness (generalized): Secondary | ICD-10-CM | POA: Insufficient documentation

## 2018-08-25 NOTE — Therapy (Signed)
[facility] [address] [place], [place], [address] Phone: [phone]   Fax:  [phone]  Physical Therapy Treatment  Patient Details  Name: [name] MRN: [id number] Date of Birth: 29-Feb-1946 Referring Provider (PT): Fuller Canada, MD   Encounter Date: 08/25/2018  PT End of Session - 08/25/18 1723    Visit Number  5    Number of Visits  12    Date for PT Re-Evaluation  09/23/18    Authorization Type  [employer] Medicare $35 copay combined with PT/SP for each visit No dedcutible; visits are based on medical necessity; no auth. required    Authorization Time Period  cert 75/88/3254 - 09/23/2018    Authorization - Visit Number  5    Authorization - Number of Visits  10    PT Start Time  1720    PT Stop Time  1800    PT Time Calculation (min)  40 min    Activity Tolerance  Patient tolerated treatment well;No increased pain;Patient limited by fatigue    Behavior During Therapy  [facility] for tasks assessed/performed       Past Medical History:  Diagnosis Date  . Asthma   . Back pain   . Depression   . Diverticulosis of colon   . GERD (gastroesophageal reflux disease)   . Hyperlipidemia   . Hypertension   . Obesity   . Osteoarthrosis, unspecified whether generalized or localized, lower leg    bilateral knees   . Pulmonary hypertension (HCC)     Past Surgical History:  Procedure Laterality Date  . ABDOMINAL HYSTERECTOMY    . COLONOSCOPY  2005   Dr. Jerolyn Shin Smith:numerous large scattered diverticula  . COLONOSCOPY N/A 11/15/2013   Dr. Darrick Penna: moderate diverticula, small internal hemorrhoids, redudant colon. Next TCS in 2025 with overtube.   . ESOPHAGOGASTRODUODENOSCOPY (EGD) WITH ESOPHAGEAL DILATION N/A 11/15/2013   Dr. 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 moderate erosive gastritis, negative H.pylori  . TOTAL KNEE ARTHROPLASTY  06/01/04   left / Dr. Romeo Apple  . TOTAL KNEE ARTHROPLASTY  11/29/03   right / Dr. Romeo Apple  .  TUBAL LIGATION      There were no vitals filed for this visit.  Subjective Assessment - 08/25/18 1720    Subjective  Pt reports pain pulling the trash can down to the road earlier today.  NO reports of current pain.    Pertinent History  GERD, HTN, diet controlled DM, HLD, Bil TKA    Patient Stated Goals  To make sure my back is better.     Currently in Pain?  No/denies                       [facility] Adult PT Treatment/Exercise - 08/25/18 0001      Lumbar Exercises: Stretches   Other Lumbar Stretch Exercise  ball strech 5x 10" holds      Lumbar Exercises: Standing   Heel Raises  15 reps    Heel Raises Limitations  toe raises     Functional Squats  10 reps    Functional Squats Limitations  front of chair for mechanics, chair taps    Forward Lunge  15 reps    Forward Lunge Limitations  on 6 in step    Other Standing Lumbar Exercises  SLS Lt 10", Rt 9" max    Other Standing Lumbar Exercises  hip abd and extension wiht ab set 10x BLE  Lumbar Exercises: Supine   Ab Set  10 reps;5 seconds;Limitations    AB Set Limitations  verbal and tactile cueing; count outloud to continue breathing; 5" holds    Pelvic Tilt  20 reps    Pelvic Tilt Limitations  Posterior; with verbal and tactile cues for form    Bent Knee Raise  10 reps;3 seconds    Bent Knee Raise Limitations  with ab set    Bridge  20 reps;3 seconds    Bridge Limitations  2 sets 2-3'' holds      Lumbar Exercises: Sidelying   Clam  Right;Left;20 reps;3 seconds    Clam Limitations  RTB               PT Short Term Goals - 08/13/18 1122      PT SHORT TERM GOAL #1   Title  Patient will be independent in performance of initial HEP for carry over for pain control and additional strengthening to decreased pain and increase function.    Time  2    Period  Weeks    Status  On-going      PT SHORT TERM GOAL #2   Title  Patient will exhibit improved body mechanics in sitting and standing to perform  activities at shoulder level and below.     Time  3    Period  Weeks    Status  On-going      PT SHORT TERM GOAL #3   Title  Patient will exhibit 1/2 grade MMT improvement in bilateral LE to indicate decreased stresses to LBP to decrease pain and improve functional abilities.     Baseline  *see MMT    Time  3    Period  Weeks    Status  On-going        PT Long Term Goals - 08/13/18 1124      PT LONG TERM GOAL #1   Title  Patient will be independent in performance of advanced HEP for carry over for pain control and additional strengthening after DC from PT.    Time  6    Period  Weeks    Status  On-going      PT LONG TERM GOAL #2   Title  Patient will exhibit 4+/5 or >  MMT in bilateral LE to indicate decreased stresses to LBP to decrease pain and improve functional abilities.     Baseline  *see MMT    Time  6    Period  Weeks    Status  On-going      PT LONG TERM GOAL #3   Title  Patient will exhibit a 10% improvement in FOTO score indicating an improvement in perceived functional abilities.    Baseline  initial _ FOTO 57% limited    Time  6    Period  Weeks    Status  On-going      PT LONG TERM GOAL #4   Title  Patient will exhibit improved body mechanics in sitting and standing to perform activities at shoulder level and above and for lifting smaller objects from a lower surface.     Time  6    Period  Weeks    Status  On-going            Plan - 08/25/18 1800    Clinical Impression Statement  Session focus on core and proximal strengthening.  Progressed standing exercises with additional lunges and wall arch for functional LE strengthening and postural  strengthening with abdominal contractoin.  Pt continues to require moderate cueing for proper abdominal contractions, was able to demonstrate HEP exercises with good form minus the ab sets.  No reoprts of increased pain through session.      Rehab Potential  Fair    Clinical Impairments Affecting Rehab Potential   positive - motivated to move, willing to learn; negative - spondylolisthesis L5-S1, body composition    PT Frequency  2x / week    PT Duration  6 weeks    PT Treatment/Interventions  Gait training;DME Instruction;Stair training;Functional mobility training;Therapeutic activities;Therapeutic exercise;Balance training;Neuromuscular re-education;Patient/family education;Manual techniques;Passive range of motion;Dry needling;Energy conservation;Taping;Joint Manipulations;Spinal Manipulations    PT Next Visit Plan  Continue ab set and mat core stab and LE strengthening exercises; Progress to standing functional lower extremity strengthening as able including functional squats and lunges    PT Home Exercise Plan  body mechanics washing dishes and sitting to peel potatoes/apples, supine bridge; 08/13/18: Abdominal set 20x 5'' holds 1x/day       Patient will benefit from skilled therapeutic intervention in order to improve the following deficits and impairments:  Abnormal gait, Difficulty walking, Decreased activity tolerance, Pain, Improper body mechanics, Decreased strength  Visit Diagnosis: Chronic bilateral low back pain with bilateral sciatica  Muscle weakness (generalized)  Other abnormalities of gait and mobility  Other symptoms and signs involving the musculoskeletal system     Problem List Patient Active Problem List   Diagnosis Date Noted  . Acute bronchitis 07/23/2018  . Chronic right shoulder pain 07/05/2018  . Intermittent knee pain 05/28/2018  . Vitamin D deficiency 11/22/2015  . Back pain with left-sided radiculopathy 09/15/2013  . Carpal tunnel syndrome on left 07/31/2011  . IGT (impaired glucose tolerance) 10/16/2007  . Hyperlipidemia LDL goal <100 10/16/2007  . Morbid obesity (HCC) 10/16/2007  . Anxiety and depression 10/16/2007  . Essential hypertension 10/16/2007  . Pulmonary hypertension (HCC) 10/16/2007  . GERD 10/16/2007  . DIVERTICULOSIS OF COLON 10/16/2007  .  OSTEOARTHRITIS, KNEES, 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   Becky Sax, LPTA; CBIS [phone]  Juel Burrow 08/25/2018, 6:05 PM   [facility] [address] [place], [place], [address] Phone: [phone]   Fax:  [phone]  Name: KIARA WELCOME MRN: [id number] Date of Birth: 1945/12/10

## 2018-08-28 ENCOUNTER — Emergency Department (HOSPITAL_COMMUNITY): Payer: Medicare Other

## 2018-08-28 ENCOUNTER — Telehealth (HOSPITAL_COMMUNITY): Payer: Self-pay

## 2018-08-28 ENCOUNTER — Encounter (HOSPITAL_COMMUNITY): Payer: Self-pay | Admitting: Emergency Medicine

## 2018-08-28 ENCOUNTER — Emergency Department (HOSPITAL_COMMUNITY)
Admission: EM | Admit: 2018-08-28 | Discharge: 2018-08-28 | Disposition: A | Discharge status: Home / Self Care | Payer: Medicare Other | Attending: Emergency Medicine | Admitting: Emergency Medicine

## 2018-08-28 ENCOUNTER — Ambulatory Visit (HOSPITAL_COMMUNITY): Payer: Medicare Other

## 2018-08-28 ENCOUNTER — Other Ambulatory Visit: Payer: Self-pay

## 2018-08-28 DIAGNOSIS — Y939 Activity, unspecified: Secondary | ICD-10-CM | POA: Insufficient documentation

## 2018-08-28 DIAGNOSIS — Z96653 Presence of artificial knee joint, bilateral: Secondary | ICD-10-CM | POA: Insufficient documentation

## 2018-08-28 DIAGNOSIS — S8991XA Unspecified injury of right lower leg, initial encounter: Secondary | ICD-10-CM | POA: Diagnosis not present

## 2018-08-28 DIAGNOSIS — Y929 Unspecified place or not applicable: Secondary | ICD-10-CM | POA: Insufficient documentation

## 2018-08-28 DIAGNOSIS — S8001XA Contusion of right knee, initial encounter: Secondary | ICD-10-CM | POA: Diagnosis not present

## 2018-08-28 DIAGNOSIS — Z7982 Long term (current) use of aspirin: Secondary | ICD-10-CM | POA: Diagnosis not present

## 2018-08-28 DIAGNOSIS — W19XXXA Unspecified fall, initial encounter: Secondary | ICD-10-CM | POA: Diagnosis not present

## 2018-08-28 DIAGNOSIS — I10 Essential (primary) hypertension: Secondary | ICD-10-CM | POA: Diagnosis not present

## 2018-08-28 DIAGNOSIS — J45909 Unspecified asthma, uncomplicated: Secondary | ICD-10-CM | POA: Diagnosis not present

## 2018-08-28 DIAGNOSIS — Z79899 Other long term (current) drug therapy: Secondary | ICD-10-CM | POA: Insufficient documentation

## 2018-08-28 DIAGNOSIS — S299XXA Unspecified injury of thorax, initial encounter: Secondary | ICD-10-CM | POA: Diagnosis not present

## 2018-08-28 DIAGNOSIS — Y999 Unspecified external cause status: Secondary | ICD-10-CM | POA: Diagnosis not present

## 2018-08-28 MED ORDER — TRAMADOL HCL 50 MG PO TABS: 50.0000 mg | ORAL_TABLET | Freq: Four times a day (QID) | ORAL | 0 refills | Status: DC | PRN

## 2018-08-28 MED ORDER — TRAMADOL HCL 50 MG PO TABS
50.0000 mg | ORAL_TABLET | Freq: Once | ORAL | Status: AC
  Administered 2018-08-28: 50 mg via ORAL
  Filled 2018-08-28: qty 1

## 2018-08-28 NOTE — Telephone Encounter (Signed)
Pt has fallen and head to the ED she will not be here today

## 2018-08-28 NOTE — ED Provider Notes (Signed)
[facility] EMERGENCY DEPARTMENT Provider Note   CSN: [id number] Arrival date & time: 08/28/18  1628     History   Chief Complaint Chief Complaint  Patient presents with  . Fall    HPI [name] is a 73 y.o. female.  Patient had a fall shortly prior to arrival she fell forward landing on the anterior part of her chest did bump her nose did not hit her head.  No nosebleed no loss of consciousness.  Patient also with swelling to her right knee she has had bilateral knee replacements.  No other complaints.  Patient able to ambulate.     Past Medical History:  Diagnosis Date  . Asthma   . Back pain   . Depression   . Diverticulosis of colon   . GERD (gastroesophageal reflux disease)   . Hyperlipidemia   . Hypertension   . Obesity   . Osteoarthrosis, unspecified whether generalized or localized, lower leg    bilateral knees   . Pulmonary hypertension [facility])     Patient Active Problem List   Diagnosis Date Noted  . Acute bronchitis 07/23/2018  . Chronic right shoulder pain 07/05/2018  . Intermittent knee pain 05/28/2018  . Vitamin D deficiency 11/22/2015  . Back pain with left-sided radiculopathy 09/15/2013  . Carpal tunnel syndrome on left 07/31/2011  . IGT (impaired glucose tolerance) 10/16/2007  . Hyperlipidemia LDL goal <100 10/16/2007  . Morbid obesity (HCC) 10/16/2007  . Anxiety and depression 10/16/2007  . Essential hypertension 10/16/2007  . Pulmonary hypertension (HCC) 10/16/2007  . GERD 10/16/2007  . DIVERTICULOSIS OF COLON 10/16/2007  . OSTEOARTHRITIS, KNEES, 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    Past Surgical History:  Procedure Laterality Date  . ABDOMINAL HYSTERECTOMY    . COLONOSCOPY  2005   Dr. Jerolyn Shin Smith:numerous large scattered diverticula  . COLONOSCOPY N/A 11/15/2013   Dr. Darrick Penna: moderate diverticula, small internal hemorrhoids, redudant colon. Next TCS in 2025 with overtube.   . ESOPHAGOGASTRODUODENOSCOPY (EGD) WITH ESOPHAGEAL DILATION N/A  11/15/2013   Dr. 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 moderate erosive gastritis, negative H.pylori  . TOTAL KNEE ARTHROPLASTY  06/01/04   left / Dr. Romeo Apple  . TOTAL KNEE ARTHROPLASTY  11/29/03   right / Dr. Romeo Apple  . TUBAL LIGATION       OB History    Gravida  2   Para  2   Term  2   Preterm      AB      Living        SAB      TAB      Ectopic      Multiple      Live Births               Home Medications    Prior to Admission medications   Medication Sig Start Date End Date Taking? Authorizing Provider  alendronate (FOSAMAX) 70 MG tablet TAKE 1 TABLET BY MOUTH  EVERY 7 DAYS WITH A FULL  GLASS OF WATER ON AN EMPTY  STOMACH 11/06/17   Kerri Perches, MD  amLODipine (NORVASC) 5 MG tablet Take 1 tablet (5 mg total) by mouth daily. 06/25/18   Kerri Perches, MD  aspirin (ASPIRIN LOW DOSE) 81 MG EC tablet Take 81 mg by mouth daily. Take one tablet by mouth once a day     [provider]  atorvastatin (LIPITOR) 20 MG tablet TAKE 1 TABLET BY MOUTH EVERY DAY 03/25/18   Kerri Perches,  MD  benzonatate (TESSALON) 100 MG capsule Take 1 capsule (100 mg total) by mouth 2 (two) times daily as needed for cough. 07/23/18   Kerri Perches, MD  Calcium Carbonate-Vit D-Min (CALCIUM 600 + MINERALS) 600-200 MG-UNIT TABS Take 1 tablet by mouth 3 (three) times daily.     [provider]  gabapentin (NEURONTIN) 300 MG capsule TAKE 1 CAPSULE BY MOUTH AT  BEDTIME 11/06/17   Kerri Perches, MD  ibuprofen (ADVIL,MOTRIN) 600 MG tablet Take 1 tablet (600 mg total) by mouth every 6 (six) hours as needed. 07/27/18   Vickki Hearing, MD  losartan-hydrochlorothiazide [facility]) 100-12.5 MG tablet TAKE 1 TABLET BY MOUTH DAILY 04/23/18   Kerri Perches, MD  metoprolol succinate (TOPROL-XL) 50 MG 24 hr tablet Take 1 tablet (50 mg total) by mouth daily. Take with or immediately following a meal. 06/29/18   Kerri Perches, MD  omeprazole  (PRILOSEC) 20 MG capsule TAKE 1 CAPSULE BY MOUTH  DAILY 06/01/18   Kerri Perches, MD  penicillin v potassium (VEETID) 500 MG tablet Take 1 tablet (500 mg total) by mouth 3 (three) times daily. 07/23/18   Kerri Perches, MD  PROAIR HFA 108 [id number] Base) MCG/ACT inhaler USE 2 PUFFS INTO LUNGS EVERY 6 HOURS AS NEEDED FOR WHEEZING OR SHORTNESS OF BREATH 01/23/17   Kerri Perches, MD  promethazine-dextromethorphan (PROMETHAZINE-DM) 6.25-15 MG/5ML syrup One teaspoon at bedtime, as needed, for excessive cough 07/23/18   Kerri Perches, MD  pyridOXINE (VITAMIN B-6) 100 MG tablet Take 200 mg by mouth daily.    [provider]  sertraline (ZOLOFT) 100 MG tablet Take 1 tablet (100 mg total) by mouth daily. 06/25/18   Kerri Perches, MD  sildenafil (REVATIO) 20 MG tablet Take 60 mg by mouth daily.     [provider]    Family History Family History  Problem Relation Age of Onset  . Aneurysm Father   . Diabetes Father   . Diabetes Mother   . Hypertension Mother   . Heart disease Mother   . Hypertension Brother   . Diabetes Brother   . Diabetes Brother   . Hypertension Brother   . Colon cancer Maternal Uncle   . Hypertension Son     Social History Social History   Tobacco Use  . Smoking status: Never Smoker  . Smokeless tobacco: Never Used  Substance Use Topics  . Alcohol use: No  . Drug use: No     Allergies   Patient has no known allergies.   Review of Systems Review of Systems  Constitutional: Negative for chills and fever.  HENT: Negative for nosebleeds, rhinorrhea and sore throat.   Eyes: Negative for visual disturbance.  Respiratory: Negative for cough and shortness of breath.   Cardiovascular: Positive for chest pain. Negative for leg swelling.  Gastrointestinal: Negative for abdominal pain, diarrhea, nausea and vomiting.  Genitourinary: Negative for dysuria.  Musculoskeletal: Positive for joint swelling. Negative for back pain and neck  pain.  Skin: Negative for rash and wound.  Neurological: Negative for dizziness, syncope, light-headedness and headaches.  Hematological: Does not bruise/bleed easily.  Psychiatric/Behavioral: Negative for confusion.     Physical Exam Updated Vital Signs BP (!) 215/96 (BP Location: Left Arm)   Pulse 77   Temp 98.8 F (37.1 C) (Oral)   Resp (!) 22   Ht 1.524 m (5')   Wt 89.8 kg   SpO2 97%   BMI 38.67 kg/m   Physical  Exam Vitals signs and nursing note reviewed.  Constitutional:      General: She is not in acute distress.    Appearance: She is well-developed.  HENT:     Head: Normocephalic and atraumatic.     Nose: Nose normal.     Comments: No nosebleed or septal hematoma.    Mouth/Throat:     Mouth: Mucous membranes are moist.  Eyes:     Extraocular Movements: Extraocular movements intact.     Conjunctiva/sclera: Conjunctivae normal.     Pupils: Pupils are equal, round, and reactive to light.  Neck:     Musculoskeletal: Neck supple.  Cardiovascular:     Rate and Rhythm: Normal rate and regular rhythm.     Heart sounds: Normal heart sounds. No murmur.  Pulmonary:     Effort: Pulmonary effort is normal. No respiratory distress.     Breath sounds: Normal breath sounds.     Comments: Tenderness to palpation without any crepitance to anterior bilateral lower ribs.  Some of this is over the cartilage area.  No abdominal tenderness. Chest:     Chest wall: Tenderness present.  Abdominal:     General: Bowel sounds are normal.     Palpations: Abdomen is soft.     Tenderness: There is no abdominal tenderness.     Comments: No abdominal tenderness to palpation.  Musculoskeletal: Normal range of motion.        General: Swelling present.  Skin:    General: Skin is warm and dry.  Neurological:     General: No focal deficit present.     Mental Status: She is alert and oriented to person, place, and time.      ED Treatments / Results  Labs (all labs ordered are listed,  but only abnormal results are displayed) Labs Reviewed - No data to display  EKG None  Radiology Dg Ribs Bilateral W/chest  Result Date: 08/28/2018 CLINICAL DATA:  Larey SeatFell in parking lot.  History of asthma. EXAM: BILATERAL RIBS AND CHEST - 4+ VIEW COMPARISON:  Chest radiograph July 23, 2018 FINDINGS: Cardiomediastinal silhouette is unremarkable for this low inspiratory examination with crowded vasculature markings. Calcified aortic arch. The lungs are clear without pleural effusions or focal consolidations. Confluence of osseous shadows LEFT upper lung. Mild chronic bronchitic changes. Similarly elevated RIGHT hemidiaphragm. Trachea projects midline and there is no pneumothorax. Included soft tissue planes and osseous structures are non-suspicious. High-riding humeral heads seen with old rotator cuff injuries. No rib fracture deformity. IMPRESSION: 1. Mild similar chronic bronchitic changes. 2. No rib fracture deformity. Electronically Signed   By: Awilda Metroourtnay  Bloomer M.D.   On: 08/28/2018 17:40   Dg Knee Complete 4 Views Right  Result Date: 08/28/2018 CLINICAL DATA:  Trip and fall in parking lot. EXAM: RIGHT KNEE - COMPLETE 4+ VIEW COMPARISON:  RIGHT knee radiograph Dec 03, 2003. FINDINGS: Status post total knee arthroplasty with intact well seated femoral and tibial components, expected appearance of the resurfaced patella. No acute fracture deformity. No dislocation. No destructive bony lesions. Soft tissue planes are nonacute. IMPRESSION: 1. No fracture deformity or dislocation. 2. Status post total knee arthroplasty without radiographic findings of hardware failure. Electronically Signed   By: Awilda Metroourtnay  Bloomer M.D.   On: 08/28/2018 21:41    Procedures Procedures (including critical care time)  Medications Ordered in ED Medications  traMADol (ULTRAM) tablet 50 mg (has no administration in time range)     Initial Impression / Assessment and Plan / ED Course  I  have reviewed the triage vital  signs and the nursing notes.  Pertinent labs & imaging results that were available during my care of the patient were reviewed by me and considered in my medical decision making (see chart for details).     Patient with a fall earlier in the day.  Larey Seat forward lot of pain to the anterior lower part of her chest lower ribs area bilaterally.  Chest x-ray with rib series negative for any acute injuries but certainly may have contused ribs.  Also had bruising to the right knee she had a knee replacement done there x-rays of that area are negative for any bony injuries.  Patient did bump her nose but she did not hit her head.  No complaint of headache.  No neck pain.  Will treat with tramadol.  And follow-up with her primary care doctor.  Final Clinical Impressions(s) / ED Diagnoses   Final diagnoses:  Fall, initial encounter  Contusion of right knee, initial encounter  Chest wall injury, initial encounter    ED Discharge Orders    None       Vanetta Mulders, MD 08/28/18 2202

## 2018-08-28 NOTE — Discharge Instructions (Addendum)
Take the tramadol as directed.  For the chest wall pain.  Make an appointment to follow-up with your doctor.  X-rays of the expect boy chest and ribs and x-ray of the right knee without any bony abnormalities.  Return for any new or worse symptoms.  Expect the ribs to be sore for 2 weeks may continue for several weeks.

## 2018-08-28 NOTE — ED Triage Notes (Signed)
Patient reports falling face forward a couple of hours ago. Patient not on blood thinners. C/O bilateral rib pain.

## 2018-08-28 NOTE — ED Notes (Signed)
Patient states that she did not take her Blood pressure medicine today. States that she is having pain and some anxiety at this time.

## 2018-08-31 ENCOUNTER — Telehealth: Payer: Self-pay | Admitting: Family Medicine

## 2018-08-31 ENCOUNTER — Other Ambulatory Visit: Payer: Self-pay | Admitting: Family Medicine

## 2018-08-31 MED ORDER — TRAMADOL HCL 50 MG PO TABS: ORAL_TABLET | ORAL | 0 refills | Status: AC

## 2018-08-31 NOTE — Telephone Encounter (Signed)
Advised patient of Dr.Simpson's treatment plan with verbal understanding 

## 2018-08-31 NOTE — Telephone Encounter (Signed)
Please let her know that with falls and injuries, people hurt more initially before they improve. Use tylenol 500 mg one 3 times daily. I see where a limited amt of tramadol has been prescribed, I prescribed an additional 10 days at most, which you can collect in 2 days

## 2018-08-31 NOTE — Telephone Encounter (Signed)
Pt fell on Friday,  Face first, went to the hosp, they advised nothing was broke, but she is hurting really bad in her chest, hurts to move. [phone]

## 2018-09-02 ENCOUNTER — Telehealth (HOSPITAL_COMMUNITY): Payer: Self-pay

## 2018-09-02 ENCOUNTER — Ambulatory Visit (HOSPITAL_COMMUNITY): Payer: Medicare Other

## 2018-09-02 NOTE — Telephone Encounter (Signed)
She fell last Friday and went to ED - she is bruised in her chest and will not be back until Tues 02/18.

## 2018-09-04 ENCOUNTER — Ambulatory Visit (HOSPITAL_COMMUNITY): Payer: Medicare Other

## 2018-09-04 DIAGNOSIS — J984 Other disorders of lung: Secondary | ICD-10-CM | POA: Diagnosis not present

## 2018-09-08 ENCOUNTER — Ambulatory Visit (HOSPITAL_COMMUNITY): Payer: Medicare Other

## 2018-09-08 DIAGNOSIS — M5442 Lumbago with sciatica, left side: Principal | ICD-10-CM

## 2018-09-08 DIAGNOSIS — M6281 Muscle weakness (generalized): Secondary | ICD-10-CM

## 2018-09-08 DIAGNOSIS — R29898 Other symptoms and signs involving the musculoskeletal system: Secondary | ICD-10-CM | POA: Diagnosis not present

## 2018-09-08 DIAGNOSIS — G8929 Other chronic pain: Secondary | ICD-10-CM | POA: Diagnosis not present

## 2018-09-08 DIAGNOSIS — M5441 Lumbago with sciatica, right side: Principal | ICD-10-CM

## 2018-09-08 DIAGNOSIS — R2689 Other abnormalities of gait and mobility: Secondary | ICD-10-CM

## 2018-09-08 NOTE — Therapy (Signed)
[facility] [address] [place], [place], [address] Phone: [phone]   Fax:  [phone]  Physical Therapy Treatment  Patient Details  Name: [name] MRN: [id number] Date of Birth: 08/31/1945 Referring Provider (PT): Fuller CanadaHarrison, Stanley, MD   Encounter Date: 09/08/2018  PT End of Session - 09/08/18 1355    Visit Number  6    Number of Visits  12    Date for PT Re-Evaluation  09/23/18    Authorization Type  [facility] $35 copay combined with PT/SP for each visit No dedcutible; visits are based on medical necessity; no auth. required    Authorization Time Period  cert 69/62/952801/22/2020 - 09/23/2018    Authorization - Visit Number  6    Authorization - Number of Visits  10    PT Start Time  0156    Activity Tolerance  Patient tolerated treatment well;No increased pain;Patient limited by fatigue    Behavior During Therapy  [facility]       Past Medical History:  Diagnosis Date  . Asthma   . Back pain   . Depression   . Diverticulosis of colon   . GERD (gastroesophageal reflux disease)   . Hyperlipidemia   . Hypertension   . Obesity   . Osteoarthrosis, unspecified whether generalized or localized, lower leg    bilateral knees   . Pulmonary hypertension (HCC)     Past Surgical History:  Procedure Laterality Date  . ABDOMINAL HYSTERECTOMY    . COLONOSCOPY  2005   Dr. Jerolyn ShinLeroy Smith:numerous large scattered diverticula  . COLONOSCOPY N/A 11/15/2013   Dr. Darrick PennaFields: moderate diverticula, small internal hemorrhoids, redudant colon. Next TCS in 2025 with overtube.   . ESOPHAGOGASTRODUODENOSCOPY (EGD) WITH ESOPHAGEAL DILATION N/A 11/15/2013   Dr. 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 moderate erosive gastritis, negative H.pylori  . TOTAL KNEE ARTHROPLASTY  06/01/04   left / Dr. Romeo AppleHArrison  . TOTAL KNEE ARTHROPLASTY  11/29/03   right / Dr. Romeo AppleHarrison  . TUBAL LIGATION      There were no vitals filed for this  visit.  Subjective Assessment - 09/08/18 1354    Subjective  Fell 08/28/2018 in a store; has bruising on right knee/shin and right rib cage/breat areas. Did go to water aerobics yesterday but that is the first activity she has done since the fall. Does feel the right rib cage pain does sometimes go into her back but doing okay right now.     Pertinent History  GERD, HTN, diet controlled DM, HLD, Bil TKA    Patient Stated Goals  To make sure my back is better.     Currently in Pain?  Yes    Pain Score  8     Pain Location  Rib cage    Pain Orientation  Right;Upper;Mid;Lower;Lateral;Anterior    Pain Descriptors / Indicators  Aching;Sore    Pain Type  Acute pain                       [facility] Adult PT Treatment/Exercise - 09/08/18 0001      Lumbar Exercises: Stretches   Other Lumbar Stretch Exercise  ball strech 5x 10" holds      Lumbar Exercises: Standing   Heel Raises  15 reps    Heel Raises Limitations  toe raises     Functional Squats  10 reps    Functional Squats Limitations  front of chair for mechanics, chair  taps    Forward Lunge  15 reps    Forward Lunge Limitations  on 6 in step    Other Standing Lumbar Exercises  SLS Lt 10", Rt 9" max    Other Standing Lumbar Exercises  hip abd and extension wiht ab set 10x BLE      Lumbar Exercises: Supine   Pelvic Tilt  20 reps    Pelvic Tilt Limitations  Posterior; with verbal and tactile cues for form    Bent Knee Raise  10 reps;3 seconds    Bent Knee Raise Limitations  with ab set    Bridge  10 reps;3 seconds    Bridge Limitations  2 sets 2-3'' holds      Lumbar Exercises: Sidelying   Clam  Right;Left;20 reps;3 seconds    Clam Limitations  RTB               PT Short Term Goals - 09/08/18 1355      PT SHORT TERM GOAL #1   Title  Patient will be independent in performance of initial HEP for carry over for pain control and additional strengthening to decreased pain and increase function.    Time  2     Period  Weeks    Status  On-going      PT SHORT TERM GOAL #2   Title  Patient will exhibit improved body mechanics in sitting and standing to perform activities at shoulder level and below.     Time  3    Period  Weeks    Status  On-going      PT SHORT TERM GOAL #3   Title  Patient will exhibit 1/2 grade MMT improvement in bilateral LE to indicate decreased stresses to LBP to decrease pain and improve functional abilities.     Baseline  *see MMT    Time  3    Period  Weeks    Status  On-going        PT Long Term Goals - 09/08/18 1355      PT LONG TERM GOAL #1   Title  Patient will be independent in performance of advanced HEP for carry over for pain control and additional strengthening after DC from PT.    Time  6    Period  Weeks    Status  On-going      PT LONG TERM GOAL #2   Title  Patient will exhibit 4+/5 or >  MMT in bilateral LE to indicate decreased stresses to LBP to decrease pain and improve functional abilities.     Baseline  *see MMT    Time  6    Period  Weeks    Status  On-going      PT LONG TERM GOAL #3   Title  Patient will exhibit a 10% improvement in FOTO score indicating an improvement in perceived functional abilities.    Baseline  initial _ FOTO 57% limited    Time  6    Period  Weeks    Status  On-going      PT LONG TERM GOAL #4   Title  Patient will exhibit improved body mechanics in sitting and standing to perform activities at shoulder level and above and for lifting smaller objects from a lower surface.     Time  6    Period  Weeks    Status  On-going            Plan - 09/08/18 1355  Clinical Impression Statement  Continued with established plan of care taking into account patient's pain level associated with her fall 02/072020. Patient performed quite well considering. Continued with weight bearing and supine LE and core strengthening exercises and balance training. Patient did not have increased complaints of pain with therapeutic  activities and reported her right rib cage area loosened up some with the exercises. Continue with current plan, progress as able.     Rehab Potential  Fair    Clinical Impairments Affecting Rehab Potential  positive - motivated to move, willing to learn; negative - spondylolisthesis L5-S1, body composition    PT Frequency  2x / week    PT Duration  6 weeks    PT Treatment/Interventions  Gait training;DME Instruction;Stair training;Functional mobility training;Therapeutic activities;Therapeutic exercise;Balance training;Neuromuscular re-education;Patient/family education;Manual techniques;Passive range of motion;Dry needling;Energy conservation;Taping;Joint Manipulations;Spinal Manipulations    PT Next Visit Plan  Continue ab set and mat core stab and LE strengthening exercises; Progress to standing functional lower extremity strengthening as able including functional squats and lunges    PT Home Exercise Plan  body mechanics washing dishes and sitting to peel potatoes/apples, supine bridge; 08/13/18: Abdominal set 20x 5'' holds 1x/day    Consulted and Agree with Plan of Care  Patient       Patient will benefit from skilled therapeutic intervention in order to improve the following deficits and impairments:  Abnormal gait, Difficulty walking, Decreased activity tolerance, Pain, Improper body mechanics, Decreased strength  Visit Diagnosis: Chronic bilateral low back pain with bilateral sciatica  Muscle weakness (generalized)  Other abnormalities of gait and mobility  Other symptoms and signs involving the musculoskeletal system     Problem List Patient Active Problem List   Diagnosis Date Noted  . Acute bronchitis 07/23/2018  . Chronic right shoulder pain 07/05/2018  . Intermittent knee pain 05/28/2018  . Vitamin D deficiency 11/22/2015  . Back pain with left-sided radiculopathy 09/15/2013  . Carpal tunnel syndrome on left 07/31/2011  . IGT (impaired glucose tolerance) 10/16/2007  .  Hyperlipidemia LDL goal <100 10/16/2007  . Morbid obesity (HCC) 10/16/2007  . Anxiety and depression 10/16/2007  . Essential hypertension 10/16/2007  . Pulmonary hypertension (HCC) 10/16/2007  . GERD 10/16/2007  . DIVERTICULOSIS OF COLON 10/16/2007  . OSTEOARTHRITIS, KNEES, 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    [name]. Hartnett-Rands, MS, PT Per [name] [facility] #[id number] 09/08/2018, 2:37 PM  [facility] [address] [place], [place], [address] Phone: [phone]   Fax:  [phone]  Name: [name] MRN: [id number] Date of Birth: 1945-10-04

## 2018-09-10 ENCOUNTER — Ambulatory Visit (HOSPITAL_COMMUNITY): Payer: Medicare Other

## 2018-09-15 ENCOUNTER — Ambulatory Visit (HOSPITAL_COMMUNITY): Payer: Medicare Other

## 2018-09-15 DIAGNOSIS — M5442 Lumbago with sciatica, left side: Principal | ICD-10-CM

## 2018-09-15 DIAGNOSIS — R2689 Other abnormalities of gait and mobility: Secondary | ICD-10-CM | POA: Diagnosis not present

## 2018-09-15 DIAGNOSIS — M5441 Lumbago with sciatica, right side: Secondary | ICD-10-CM | POA: Diagnosis not present

## 2018-09-15 DIAGNOSIS — M6281 Muscle weakness (generalized): Secondary | ICD-10-CM

## 2018-09-15 DIAGNOSIS — R29898 Other symptoms and signs involving the musculoskeletal system: Secondary | ICD-10-CM | POA: Diagnosis not present

## 2018-09-15 DIAGNOSIS — G8929 Other chronic pain: Secondary | ICD-10-CM

## 2018-09-15 NOTE — Therapy (Signed)
[place], [place], [address] Phone: [phone]   Fax:  [phone]  Physical Therapy Treatment/Progress Note  Patient Details  Name: [name] MRN: [id number] Date of Birth: 1946-03-30 Referring Provider (PT): Arther Abbott, MD   Encounter Date: 09/15/2018  PT End of Session - 09/15/18 1402    Visit Number  7    Number of Visits  12    Date for PT Re-Evaluation  09/23/18   mini-reassess completed 09/15/2018   Authorization Type  [employer] Medicare $35 copay combined with PT/SP for each visit No dedcutible; visits are based on medical necessity; no auth. required    Authorization Time Period  cert 58/52/7782 - 42/09/5359    Authorization - Visit Number  7    Authorization - Number of Visits  10    PT Start Time  0202    PT Stop Time  4431    PT Time Calculation (min)  40 min    Activity Tolerance  Patient tolerated treatment well;No increased pain;Patient limited by fatigue    Behavior During Therapy  [facility] for tasks assessed/performed       Past Medical History:  Diagnosis Date  . Asthma   . Back pain   . Depression   . Diverticulosis of colon   . GERD (gastroesophageal reflux disease)   . Hyperlipidemia   . Hypertension   . Obesity   . Osteoarthrosis, unspecified whether generalized or localized, lower leg    bilateral knees   . Pulmonary hypertension ([place])     Past Surgical History:  Procedure Laterality Date  . ABDOMINAL HYSTERECTOMY    . COLONOSCOPY  2005   Dr. Leane Para Smith:numerous large scattered diverticula  . COLONOSCOPY N/A 11/15/2013   Dr. Oneida Alar: moderate diverticula, small internal hemorrhoids, redudant colon. Next TCS in 2025 with overtube.   . ESOPHAGOGASTRODUODENOSCOPY (EGD) WITH ESOPHAGEAL DILATION N/A 11/15/2013   Dr. Oneida Alar: stricture at [place] junction s/p dilation. moderate erosive gastritis, negative H.pylori  . TOTAL KNEE ARTHROPLASTY  06/01/04   left / Dr. Aline Brochure  . TOTAL  KNEE ARTHROPLASTY  11/29/03   right / Dr. Aline Brochure  . TUBAL LIGATION      There were no vitals filed for this visit.  Subjective Assessment - 09/15/18 1401    Subjective  Doing pretty well today; feels low back is doing better but still sore on right chest and side from fall. Soreness is improving with use and time.    Pertinent History  GERD, HTN, diet controlled DM, HLD, Bil TKA    Patient Stated Goals  To make sure my back is better.          [facility] PT Assessment - 09/15/18 0001      Observation/Other Assessments   Focus on Therapeutic Outcomes (FOTO)   47% limited   was57% limited     Strength   Right Hip Flexion  4+/5    Right Hip Extension  4/5   was 4-/5   Right Hip ABduction  4/5   ws 4-/5   Left Hip Flexion  4/5   was 4-/5   Left Hip Extension  4/5   was 4-/5   Left Hip ABduction  4/5   was 4-/5   Right Knee Flexion  4/5   wsa 4/5   Right Knee Extension  5/5   was 4+/5   Left Knee Flexion  4/5   was 4-/5   Left Knee Extension  5/5  was 4/5   Right Ankle Dorsiflexion  4/5   was 4-/5   Left Ankle Dorsiflexion  4+/5   was 4/5     Transfers   Five time sit to stand comments   15.09   16.48 sec     Static Standing Balance   Static Standing - Balance Support  No upper extremity supported    Static Standing Balance -  Activities   Single Leg Stance - Right Leg;Single Leg Stance - Left Leg    Static Standing - Comment/# of Minutes  Right 8 sec; Lt 6 sec   was Rt 3; Lt 2 sec                  [facility] Adult PT Treatment/Exercise - 09/15/18 0001      Lumbar Exercises: Standing   Heel Raises  15 reps    Heel Raises Limitations  toe raises     Other Standing Lumbar Exercises  sidestepping with RTB 2RT          Balance Exercises - 09/15/18 1421      Balance Exercises: Standing   Tandem Stance  Eyes open;Foam/compliant surface;Intermittent upper extremity support;2 reps;30 secs    SLS  Solid surface    SLS with Vectors  Intermittent upper  extremity assist;1 rep;30 secs   bilateral       PT Education - 09/15/18 1445    Education Details  Re-assessment findings and goals. Discussed purpose and technique of interventions throughout session.    Person(s) Educated  Patient    Methods  Explanation;Demonstration    Comprehension  Verbalized understanding;Returned demonstration       PT Short Term Goals - 09/15/18 1448      PT SHORT TERM GOAL #1   Title  Patient will be independent in performance of initial HEP for carry over for pain control and additional strengthening to decreased pain and increase function.    Time  2    Period  Weeks    Status  Achieved      PT SHORT TERM GOAL #2   Title  Patient will exhibit improved body mechanics in sitting and standing to perform activities at shoulder level and below.     Baseline  09/15/2018 - patient continues to problem solve problem areas at home.    Time  3    Period  Weeks    Status  Partially Met      PT SHORT TERM GOAL #3   Title  Patient will exhibit 1/2 grade MMT improvement in bilateral LE to indicate decreased stresses to LBP to decrease pain and improve functional abilities.     Baseline  *see MMT    Time  3    Period  Weeks    Status  Achieved        PT Long Term Goals - 09/15/18 1449      PT LONG TERM GOAL #1   Title  Patient will be independent in performance of advanced HEP for carry over for pain control and additional strengthening after DC from PT.    Time  6    Period  Weeks    Status  On-going      PT LONG TERM GOAL #2   Title  Patient will exhibit 4+/5 or >  MMT in bilateral LE to indicate decreased stresses to LBP to decrease pain and improve functional abilities.     Baseline  *see MMT    Time  6  Period  Weeks    Status  Partially Met      PT LONG TERM GOAL #3   Title  Patient will exhibit a 10% improvement in FOTO score indicating an improvement in perceived functional abilities.    Baseline  initial _ FOTO 57% limited; 09/15/2018  - 47% limited    Time  6    Period  Weeks    Status  Achieved      PT LONG TERM GOAL #4   Title  Patient will exhibit improved body mechanics in sitting and standing to perform activities at shoulder level and above and for lifting smaller objects from a lower surface.     Time  6    Period  Weeks    Status  On-going            Plan - 09/15/18 1402    Clinical Impression Statement  Conitnued with established plan of care. Advanced therpeutic exercises and balance training. Re-assessed today exhibiting improved pain, strength, balance and function. Patient would continue to benefit from physical therapy to continue improvements in these areas and return to PLOF and improved QOL. Patient continues to work through Estate manager/land agent at home to decrease the load on her back.     Rehab Potential  Fair    Clinical Impairments Affecting Rehab Potential  positive - motivated to move, willing to learn; negative - spondylolisthesis L5-S1, body composition    PT Frequency  2x / week    PT Duration  6 weeks    PT Treatment/Interventions  Gait training;DME Instruction;Stair training;Functional mobility training;Therapeutic activities;Therapeutic exercise;Balance training;Neuromuscular re-education;Patient/family education;Manual techniques;Passive range of motion;Dry needling;Energy conservation;Taping;Joint Manipulations;Spinal Manipulations    PT Next Visit Plan  Continue ab set and mat core stab and LE strengthening exercises; Progress to standing functional lower extremity strengthening as able including functional squats and lunges    PT Home Exercise Plan  body mechanics washing dishes and sitting to peel potatoes/apples, supine bridge; 08/13/18: Abdominal set 20x 5'' holds 1x/day    Consulted and Agree with Plan of Care  Patient       Patient will benefit from skilled therapeutic intervention in order to improve the following deficits and impairments:  Abnormal gait, Difficulty  walking, Decreased activity tolerance, Pain, Improper body mechanics, Decreased strength  Visit Diagnosis: Chronic bilateral low back pain with bilateral sciatica  Muscle weakness (generalized)  Other abnormalities of gait and mobility  Other symptoms and signs involving the musculoskeletal system     Problem List Patient Active Problem List   Diagnosis Date Noted  . Acute bronchitis 07/23/2018  . Chronic right shoulder pain 07/05/2018  . Intermittent knee pain 05/28/2018  . Vitamin D deficiency 11/22/2015  . Back pain with left-sided radiculopathy 09/15/2013  . Carpal tunnel syndrome on left 07/31/2011  . IGT (impaired glucose tolerance) 10/16/2007  . Hyperlipidemia LDL goal <100 10/16/2007  . Morbid obesity ([place]) 10/16/2007  . Anxiety and depression 10/16/2007  . Essential hypertension 10/16/2007  . Pulmonary hypertension ([place]) 10/16/2007  . GERD 10/16/2007  . DIVERTICULOSIS OF COLON 10/16/2007  . OSTEOARTHRITIS, KNEES, 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    Floria Raveling. Hartnett-Rands, MS, PT Per [place] #[id number] 09/15/2018, 2:51 PM  [place] [address] [place], [place], [address] Phone: [phone]   Fax:  [phone]  Name: [name] MRN: [id number] Date of Birth: 1945-12-03

## 2018-09-17 ENCOUNTER — Ambulatory Visit (HOSPITAL_COMMUNITY): Payer: Medicare Other

## 2018-09-17 DIAGNOSIS — M5441 Lumbago with sciatica, right side: Secondary | ICD-10-CM | POA: Diagnosis not present

## 2018-09-17 DIAGNOSIS — M5442 Lumbago with sciatica, left side: Principal | ICD-10-CM

## 2018-09-17 DIAGNOSIS — R2689 Other abnormalities of gait and mobility: Secondary | ICD-10-CM

## 2018-09-17 DIAGNOSIS — G8929 Other chronic pain: Secondary | ICD-10-CM | POA: Diagnosis not present

## 2018-09-17 DIAGNOSIS — R29898 Other symptoms and signs involving the musculoskeletal system: Secondary | ICD-10-CM | POA: Diagnosis not present

## 2018-09-17 DIAGNOSIS — M6281 Muscle weakness (generalized): Secondary | ICD-10-CM | POA: Diagnosis not present

## 2018-09-17 NOTE — Therapy (Signed)
[facility], [place], [address] Phone: [phone]   Fax:  [phone]  Physical Therapy Treatment  Patient Details  Name: [name] MRN: [id number] Date of Birth: [birth year] Referring Provider (PT): Arther Abbott, MD   Encounter Date: 09/17/2018  PT End of Session - 09/17/18 1352    Visit Number  8    Number of Visits  12    Date for PT Re-Evaluation  09/23/18   mini-reassess completed 09/15/2018   Authorization Type  [employer] Medicare $35 copay combined with PT/SP for each visit No dedcutible; visits are based on medical necessity; no auth. required    Authorization Time Period  cert 49/67/5916 - 38/10/6657    Authorization - Visit Number  8    Authorization - Number of Visits  10    PT Start Time  0153    PT Stop Time  0234    PT Time Calculation (min)  41 min    Activity Tolerance  Patient tolerated treatment well;No increased pain;Patient limited by fatigue    Behavior During Therapy  [facility] for tasks assessed/performed       Past Medical History:  Diagnosis Date  . Asthma   . Back pain   . Depression   . Diverticulosis of colon   . GERD (gastroesophageal reflux disease)   . Hyperlipidemia   . Hypertension   . Obesity   . Osteoarthrosis, unspecified whether generalized or localized, lower leg    bilateral knees   . Pulmonary hypertension ([place])     Past Surgical History:  Procedure Laterality Date  . ABDOMINAL HYSTERECTOMY    . COLONOSCOPY  2005   Dr. Leane Para Smith:numerous large scattered diverticula  . COLONOSCOPY N/A 11/15/2013   Dr. Oneida Alar: moderate diverticula, small internal hemorrhoids, redudant colon. Next TCS in 2025 with overtube.   . ESOPHAGOGASTRODUODENOSCOPY (EGD) WITH ESOPHAGEAL DILATION N/A 11/15/2013   Dr. Oneida Alar: stricture at [place] junction s/p dilation. moderate erosive gastritis, negative H.pylori  . TOTAL KNEE ARTHROPLASTY  06/01/04   left / Dr. Aline Brochure  . TOTAL KNEE  ARTHROPLASTY  11/29/03   right / Dr. Aline Brochure  . TUBAL LIGATION      There were no vitals filed for this visit.  Subjective Assessment - 09/17/18 1352    Subjective  Right ribcage still hurting 7/10 today. No pain in low back though.     Pertinent History  GERD, HTN, diet controlled DM, HLD, Bil TKA    Patient Stated Goals  To make sure my back is better.                        [place] Adult PT Treatment/Exercise - 09/17/18 0001      Lumbar Exercises: Stretches   Other Lumbar Stretch Exercise  ball strech 5x 10" holds      Lumbar Exercises: Standing   Heel Raises  15 reps    Heel Raises Limitations  toe raises     Functional Squats  10 reps    Functional Squats Limitations  front of chair for mechanics, chair taps    Other Standing Lumbar Exercises  sidestepping with RTB 2RT    Other Standing Lumbar Exercises  hip abd and extension wiht ab set 10x BLE          Balance Exercises - 09/17/18 1424      Balance Exercises: Standing   Tandem Stance  Eyes open;Foam/compliant surface;Intermittent upper extremity support;2 reps;30 secs  SLS  Solid surface;1 rep;30 secs   Rt 14 sec max; Lt 8 sec max   SLS with Vectors  5 reps;Intermittent upper extremity assist;Solid surface   3 sec holds   Other Standing Exercises  [place] in tandem/airdex w/ RTB x10   airdex/tandem x10 w/ 2# UE flexion       PT Education - 09/17/18 1403    Education Details  Discussed purpose and technique of interventions throughout session.    Person(s) Educated  Patient    Methods  Explanation;Demonstration    Comprehension  Verbalized understanding;Returned demonstration       PT Short Term Goals - 09/15/18 1448      PT SHORT TERM GOAL #1   Title  Patient will be independent in performance of initial HEP for carry over for pain control and additional strengthening to decreased pain and increase function.    Time  2    Period  Weeks    Status  Achieved      PT SHORT TERM GOAL #2    Title  Patient will exhibit improved body mechanics in sitting and standing to perform activities at shoulder level and below.     Baseline  09/15/2018 - patient continues to problem solve problem areas at home.    Time  3    Period  Weeks    Status  Partially Met      PT SHORT TERM GOAL #3   Title  Patient will exhibit 1/2 grade MMT improvement in bilateral LE to indicate decreased stresses to LBP to decrease pain and improve functional abilities.     Baseline  *see MMT    Time  3    Period  Weeks    Status  Achieved        PT Long Term Goals - 09/15/18 1449      PT LONG TERM GOAL #1   Title  Patient will be independent in performance of advanced HEP for carry over for pain control and additional strengthening after DC from PT.    Time  6    Period  Weeks    Status  On-going      PT LONG TERM GOAL #2   Title  Patient will exhibit 4+/5 or >  MMT in bilateral LE to indicate decreased stresses to LBP to decrease pain and improve functional abilities.     Baseline  *see MMT    Time  6    Period  Weeks    Status  Partially Met      PT LONG TERM GOAL #3   Title  Patient will exhibit a 10% improvement in FOTO score indicating an improvement in perceived functional abilities.    Baseline  initial _ FOTO 57% limited; 09/15/2018 - 47% limited    Time  6    Period  Weeks    Status  Achieved      PT LONG TERM GOAL #4   Title  Patient will exhibit improved body mechanics in sitting and standing to perform activities at shoulder level and above and for lifting smaller objects from a lower surface.     Time  6    Period  Weeks    Status  On-going            Plan - 09/17/18 1353    Clinical Impression Statement  Continued with established plan of care. Continued with LE strengthening exercises and dynamicbalance training. Patient exhibited fatigue during session and required 2 short  sitting rest breaks but continued readily. Continue with current plan, progress as able.      Rehab Potential  Fair    Clinical Impairments Affecting Rehab Potential  positive - motivated to move, willing to learn; negative - spondylolisthesis L5-S1, body composition    PT Frequency  2x / week    PT Duration  6 weeks    PT Treatment/Interventions  Gait training;DME Instruction;Stair training;Functional mobility training;Therapeutic activities;Therapeutic exercise;Balance training;Neuromuscular re-education;Patient/family education;Manual techniques;Passive range of motion;Dry needling;Energy conservation;Taping;Joint Manipulations;Spinal Manipulations    PT Next Visit Plan  Continue core stab and LE strengthening exercises and balance training;  lunges    PT Home Exercise Plan  body mechanics washing dishes and sitting to peel potatoes/apples, supine bridge; 08/13/18: Abdominal set 20x 5'' holds 1x/day    Consulted and Agree with Plan of Care  Patient       Patient will benefit from skilled therapeutic intervention in order to improve the following deficits and impairments:  Abnormal gait, Difficulty walking, Decreased activity tolerance, Pain, Improper body mechanics, Decreased strength  Visit Diagnosis: Chronic bilateral low back pain with bilateral sciatica  Muscle weakness (generalized)  Other abnormalities of gait and mobility  Other symptoms and signs involving the musculoskeletal system     Problem List Patient Active Problem List   Diagnosis Date Noted  . Acute bronchitis 07/23/2018  . Chronic right shoulder pain 07/05/2018  . Intermittent knee pain 05/28/2018  . Vitamin D deficiency 11/22/2015  . Back pain with left-sided radiculopathy 09/15/2013  . Carpal tunnel syndrome on left 07/31/2011  . IGT (impaired glucose tolerance) 10/16/2007  . Hyperlipidemia LDL goal <100 10/16/2007  . Morbid obesity ([place]) 10/16/2007  . Anxiety and depression 10/16/2007  . Essential hypertension 10/16/2007  . Pulmonary hypertension ([place]) 10/16/2007  . GERD 10/16/2007  .  DIVERTICULOSIS OF COLON 10/16/2007  . OSTEOARTHRITIS, KNEES, 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    Floria Raveling. Hartnett-Rands, MS, PT [address] [place] [phone] 09/17/2018, 2:45 PM  [facility] [address] [place], [place], [address] Phone: [phone]   Fax:  [phone]  Name: [name] MRN: [id number] Date of Birth: April 25, 1946

## 2018-09-21 ENCOUNTER — Other Ambulatory Visit: Payer: Self-pay | Admitting: Family Medicine

## 2018-09-22 ENCOUNTER — Ambulatory Visit (HOSPITAL_COMMUNITY): Payer: Medicare Other | Attending: Orthopedic Surgery

## 2018-09-22 ENCOUNTER — Encounter (HOSPITAL_COMMUNITY): Payer: Self-pay

## 2018-09-22 DIAGNOSIS — G8929 Other chronic pain: Secondary | ICD-10-CM | POA: Insufficient documentation

## 2018-09-22 DIAGNOSIS — R29898 Other symptoms and signs involving the musculoskeletal system: Secondary | ICD-10-CM

## 2018-09-22 DIAGNOSIS — M5442 Lumbago with sciatica, left side: Secondary | ICD-10-CM | POA: Diagnosis not present

## 2018-09-22 DIAGNOSIS — M6281 Muscle weakness (generalized): Secondary | ICD-10-CM | POA: Insufficient documentation

## 2018-09-22 DIAGNOSIS — M5441 Lumbago with sciatica, right side: Secondary | ICD-10-CM | POA: Insufficient documentation

## 2018-09-22 DIAGNOSIS — R2689 Other abnormalities of gait and mobility: Secondary | ICD-10-CM | POA: Diagnosis not present

## 2018-09-22 NOTE — Therapy (Signed)
[place] [place], [place], [address] Phone: [phone]   Fax:  [phone]  Physical Therapy Treatment  Patient Details  Name: [name] MRN: [id number] Date of Birth: 1946-04-29 Referring Provider (PT): Arther Abbott, MD   Encounter Date: 09/22/2018  PT End of Session - 09/22/18 2202    Visit Number  9    Number of Visits  12    Date for PT Re-Evaluation  09/23/18   Minireassessment complete 09/15/18   Authorization Type  [employer] Medicare $35 copay combined with PT/SP for each visit No dedcutible; visits are based on medical necessity; no auth. required    Authorization Time Period  cert 54/27/0623 - 76/08/8313    Authorization - Visit Number  9    Authorization - Number of Visits  18    PT Start Time  1301    PT Stop Time  1341    PT Time Calculation (min)  40 min    Activity Tolerance  Patient tolerated treatment well;No increased pain;Patient limited by fatigue    Behavior During Therapy  [facility] for tasks assessed/performed       Past Medical History:  Diagnosis Date  . Asthma   . Back pain   . Depression   . Diverticulosis of colon   . GERD (gastroesophageal reflux disease)   . Hyperlipidemia   . Hypertension   . Obesity   . Osteoarthrosis, unspecified whether generalized or localized, lower leg    bilateral knees   . Pulmonary hypertension ([place])     Past Surgical History:  Procedure Laterality Date  . ABDOMINAL HYSTERECTOMY    . COLONOSCOPY  2005   Dr. Leane Para Smith:numerous large scattered diverticula  . COLONOSCOPY N/A 11/15/2013   Dr. Oneida Alar: moderate diverticula, small internal hemorrhoids, redudant colon. Next TCS in 2025 with overtube.   . ESOPHAGOGASTRODUODENOSCOPY (EGD) WITH ESOPHAGEAL DILATION N/A 11/15/2013   Dr. Oneida Alar: stricture at [place] junction s/p dilation. moderate erosive gastritis, negative H.pylori  . TOTAL KNEE ARTHROPLASTY  06/01/04   left / Dr. Aline Brochure  . TOTAL KNEE ARTHROPLASTY   11/29/03   right / Dr. Aline Brochure  . TUBAL LIGATION      There were no vitals filed for this visit.  Subjective Assessment - 09/22/18 1300    Subjective  No pain today, feels she has made some progress with therapy though feels she needs to continue.  Improved maybe 60% since began.      Pertinent History  GERD, HTN, diet controlled DM, HLD, Bil TKA    Patient Stated Goals  To make sure my back is better.     Currently in Pain?  No/denies         [facility] PT Assessment - 09/22/18 0001      Assessment   Medical Diagnosis  Chronic bilateral low back pain with bilateral sciatica    Referring Provider (PT)  Arther Abbott, MD    Onset Date/Surgical Date  04/21/18    Hand Dominance  Right    Next MD Visit  April 2020    Prior Therapy  None                   [facility] Adult PT Treatment/Exercise - 09/22/18 0001      Lumbar Exercises: Standing   Heel Raises  20 reps    Heel Raises Limitations  toe raises incline slope    Functional Squats  10 reps    Functional Squats Limitations  front of chair for mechanics, chair taps    Lifting  From floor;10 reps    Lifting Limitations  pt able to demonstrate appropriate mechanics    Forward Lunge  10 reps    Forward Lunge Limitations  on floor minimal HHA    Other Standing Lumbar Exercises  sidestepping with RTB 2RT    Other Standing Lumbar Exercises  hip extension wiht ab set 10x BLE RTB          Balance Exercises - 09/22/18 1314      Balance Exercises: Standing   Tandem Stance  Eyes open;4 reps   pallof tandem   SLS  Eyes open;5 reps   Rt 5"; Lt 11"         PT Short Term Goals - 09/15/18 1448      PT SHORT TERM GOAL #1   Title  Patient will be independent in performance of initial HEP for carry over for pain control and additional strengthening to decreased pain and increase function.    Time  2    Period  Weeks    Status  Achieved      PT SHORT TERM GOAL #2   Title  Patient will exhibit improved body  mechanics in sitting and standing to perform activities at shoulder level and below.     Baseline  09/15/2018 - patient continues to problem solve problem areas at home.    Time  3    Period  Weeks    Status  Partially Met      PT SHORT TERM GOAL #3   Title  Patient will exhibit 1/2 grade MMT improvement in bilateral LE to indicate decreased stresses to LBP to decrease pain and improve functional abilities.     Baseline  *see MMT    Time  3    Period  Weeks    Status  Achieved        PT Long Term Goals - 09/15/18 1449      PT LONG TERM GOAL #1   Title  Patient will be independent in performance of advanced HEP for carry over for pain control and additional strengthening after DC from PT.    Time  6    Period  Weeks    Status  On-going      PT LONG TERM GOAL #2   Title  Patient will exhibit 4+/5 or >  MMT in bilateral LE to indicate decreased stresses to LBP to decrease pain and improve functional abilities.     Baseline  *see MMT    Time  6    Period  Weeks    Status  Partially Met      PT LONG TERM GOAL #3   Title  Patient will exhibit a 10% improvement in FOTO score indicating an improvement in perceived functional abilities.    Baseline  initial _ FOTO 57% limited; 09/15/2018 - 47% limited    Time  6    Period  Weeks    Status  Achieved      PT LONG TERM GOAL #4   Title  Patient will exhibit improved body mechanics in sitting and standing to perform activities at shoulder level and above and for lifting smaller objects from a lower surface.     Time  6    Period  Weeks    Status  On-going            Plan - 09/22/18 1343    Clinical Impression Statement  Continued wiht established POC for functional strengtheing and balance training. Pt with some SOB wiht diaphragmatic breathing instructed to improve breathing with exercises, seated rest breaks PRN through session per fatigue.  Progressed to lunges on floor for functional strengthening and reviewed mechanics wiht  proper lifting, with min cueing for mechanics.  Reviewed compliace with HEP, given additional exercises for balance and strengthening with good form and verbal understanding.     Rehab Potential  Fair    Clinical Impairments Affecting Rehab Potential  positive - motivated to move, willing to learn; negative - spondylolisthesis L5-S1, body composition    PT Frequency  2x / week    PT Duration  6 weeks    PT Treatment/Interventions  Gait training;DME Instruction;Stair training;Functional mobility training;Therapeutic activities;Therapeutic exercise;Balance training;Neuromuscular re-education;Patient/family education;Manual techniques;Passive range of motion;Dry needling;Energy conservation;Taping;Joint Manipulations;Spinal Manipulations    PT Next Visit Plan  New cert due next sessoin, progress note completed on 09/15/18.  Continue core stab and LE strengthening exercises and balance training;     PT Home Exercise Plan  body mechanics washing dishes and sitting to peel potatoes/apples, supine bridge; 08/13/18: Abdominal set 20x 5'' holds 1x/day; 09/22/18: heel/toe raises, SLS and tandem stance       Patient will benefit from skilled therapeutic intervention in order to improve the following deficits and impairments:  Abnormal gait, Difficulty walking, Decreased activity tolerance, Pain, Improper body mechanics, Decreased strength  Visit Diagnosis: Chronic bilateral low back pain with bilateral sciatica  Muscle weakness (generalized)  Other abnormalities of gait and mobility  Other symptoms and signs involving the musculoskeletal system     Problem List Patient Active Problem List   Diagnosis Date Noted  . Acute bronchitis 07/23/2018  . Chronic right shoulder pain 07/05/2018  . Intermittent knee pain 05/28/2018  . Vitamin D deficiency 11/22/2015  . Back pain with left-sided radiculopathy 09/15/2013  . Carpal tunnel syndrome on left 07/31/2011  . IGT (impaired glucose tolerance) 10/16/2007   . Hyperlipidemia LDL goal <100 10/16/2007  . Morbid obesity ([place]) 10/16/2007  . Anxiety and depression 10/16/2007  . Essential hypertension 10/16/2007  . Pulmonary hypertension ([place]) 10/16/2007  . GERD 10/16/2007  . DIVERTICULOSIS OF COLON 10/16/2007  . OSTEOARTHRITIS, [place], 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   Ihor Austin, LPTA; [place]  Aldona Lento 09/22/2018, 2:51 PM  [facility] [address] [place], [place], [address] Phone: [phone]   Fax:  [phone]  Name: [name] MRN: [id number] Date of Birth: [birth year]

## 2018-09-22 NOTE — Patient Instructions (Signed)
Toe / Heel Raise (Standing)    Standing with support, raise heels, then rock back on heels and raise toes. Repeat 20 times.  Copyright  VHI. All rights reserved.   Single Leg Balance: Eyes Open    Stand on right leg with eyes open. Hold 30 seconds. 5 reps 2 times per day.  http://ggbe.exer.us/5   Copyright  VHI. All rights reserved.   Tandem Stance    Right foot in front of left, heel touching toe both feet "straight ahead". Stand on Foot Triangle of Support with both feet. Balance in this position 30 seconds. Do with left foot in front of right.  Copyright  VHI. All rights reserved.   Bridge    Lie back, legs bent. Inhale, pressing hips up. Keeping ribs in, lengthen lower back. Exhale, rolling down along spine from top. Repeat 10 times. Do 2 sessions per day.  http://pm.exer.us/55   Copyright  VHI. All rights reserved.

## 2018-09-24 ENCOUNTER — Telehealth (HOSPITAL_COMMUNITY): Payer: Self-pay | Admitting: Family Medicine

## 2018-09-24 ENCOUNTER — Ambulatory Visit (HOSPITAL_COMMUNITY): Payer: Medicare Other | Admitting: Physical Therapy

## 2018-09-24 NOTE — Telephone Encounter (Signed)
09/24/18  pt called to reschedule said she couldn't come today

## 2018-09-28 ENCOUNTER — Ambulatory Visit (HOSPITAL_COMMUNITY): Payer: Medicare Other | Admitting: Physical Therapy

## 2018-09-28 ENCOUNTER — Telehealth (HOSPITAL_COMMUNITY): Payer: Self-pay | Admitting: Family Medicine

## 2018-09-28 NOTE — Telephone Encounter (Signed)
09/28/18  in [place] with her mom who is in the hospital... we rescehduled this appt to 3/10

## 2018-09-29 ENCOUNTER — Ambulatory Visit (HOSPITAL_COMMUNITY): Payer: Medicare Other | Admitting: Physical Therapy

## 2018-09-29 DIAGNOSIS — M5442 Lumbago with sciatica, left side: Principal | ICD-10-CM

## 2018-09-29 DIAGNOSIS — M6281 Muscle weakness (generalized): Secondary | ICD-10-CM

## 2018-09-29 DIAGNOSIS — M5441 Lumbago with sciatica, right side: Secondary | ICD-10-CM | POA: Diagnosis not present

## 2018-09-29 DIAGNOSIS — R29898 Other symptoms and signs involving the musculoskeletal system: Secondary | ICD-10-CM

## 2018-09-29 DIAGNOSIS — R2689 Other abnormalities of gait and mobility: Secondary | ICD-10-CM | POA: Diagnosis not present

## 2018-09-29 DIAGNOSIS — G8929 Other chronic pain: Secondary | ICD-10-CM

## 2018-09-29 NOTE — Therapy (Addendum)
[place], [place], [address] Phone: [phone]   Fax:  [phone]  Physical Therapy Treatment  Patient Details  Name: [name] MRN: [id number] Date of Birth: 11/25/1945 Referring Provider (PT): Arther Abbott, MD   PHYSICAL THERAPY DISCHARGE SUMMARY  Visits from Start of Care: 10  Current functional level related to goals / functional outcomes: Pt comes today stating no pain or diffiuculty completing all actvities.  States she is only limited by her breathing and feels this will get better with conitnued aquatic therapy at the [facility].  Pt is independent with HEP and no longer is in need of skilled PT.  Pt has met all her goals at this point.      Remaining deficits: None   Education / Equipment: HEP  Plan: Patient agrees to discharge.  Patient goals were met. Patient is being discharged due to meeting the stated rehab goals.  ?????          I have read and reviewed this note and agree with the findings.  Floria Raveling. Hartnett-Rands, MS, PT Per [place] #[id number]    Encounter Date: 09/29/2018  PT End of Session - 09/29/18 1601    Visit Number  10    Number of Visits  12    Date for PT Re-Evaluation  09/23/18   Minireassessment complete 09/15/18   Authorization Type  [employer] Medicare $35 copay combined with PT/SP for each visit No dedcutible; visits are based on medical necessity; no auth. required    Authorization Time Period  cert 32/99/2426 - 83/10/1960    Authorization - Visit Number  10    Authorization - Number of Visits  18    PT Start Time  2297    PT Stop Time  1556    PT Time Calculation (min)  38 min    Activity Tolerance  Patient tolerated treatment well;No increased pain;Patient limited by fatigue    Behavior During Therapy  [facility] for tasks assessed/performed       Past Medical History:  Diagnosis Date  . Asthma   . Back pain   . Depression   .  Diverticulosis of colon   . GERD (gastroesophageal reflux disease)   . Hyperlipidemia   . Hypertension   . Obesity   . Osteoarthrosis, unspecified whether generalized or localized, lower leg    bilateral knees   . Pulmonary hypertension ([place])     Past Surgical History:  Procedure Laterality Date  . ABDOMINAL HYSTERECTOMY    . COLONOSCOPY  2005   Dr. Leane Para Smith:numerous large scattered diverticula  . COLONOSCOPY N/A 11/15/2013   Dr. Oneida Alar: moderate diverticula, small internal hemorrhoids, redudant colon. Next TCS in 2025 with overtube.   . ESOPHAGOGASTRODUODENOSCOPY (EGD) WITH ESOPHAGEAL DILATION N/A 11/15/2013   Dr. 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 moderate erosive gastritis, negative H.pylori  . TOTAL KNEE ARTHROPLASTY  06/01/04   left / Dr. Aline Brochure  . TOTAL KNEE ARTHROPLASTY  11/29/03   right / Dr. Aline Brochure  . TUBAL LIGATION      There were no vitals filed for this visit.      [facility] PT Assessment - 09/29/18 0001      Assessment   Medical Diagnosis  Chronic bilateral low back pain with bilateral sciatica    Referring Provider (PT)  Arther Abbott, MD    Onset Date/Surgical Date  04/21/18    Hand Dominance  Right  Next MD Visit  April 2020    Prior Therapy  None      Observation/Other Assessments   Focus on Therapeutic Outcomes (FOTO)   22% limited   was 47%, 57%     Strength   Right Hip Flexion  5/5    Right Hip Extension  5/5    Right Hip ABduction  5/5    Left Hip Flexion  5/5    Left Hip Extension  5/5    Left Hip ABduction  5/5    Right Knee Extension  5/5    Left Knee Extension  5/5    Right Ankle Dorsiflexion  5/5    Left Ankle Dorsiflexion  5/5      Transfers   Five time sit to stand comments   10.86   no UE's standard height , was 15.09     Static Standing Balance   Static Standing - Balance Support  No upper extremity supported    Static Standing Balance -  Activities   Single Leg Stance - Right Leg;Single Leg Stance - Left  Leg    Static Standing - Comment/# of Minutes  bil 10" (was 8" Rt, 6" Lt)                   [facility] Adult PT Treatment/Exercise - 09/29/18 0001      Lumbar Exercises: Standing   Heel Raises  20 reps    Heel Raises Limitations  toe raises incline slope    Functional Squats  10 reps    Lifting Limitations  pt able to demonstrate appropriate mechanics    Forward Lunge  10 reps    Forward Lunge Limitations  on floor minimal HHA               PT Short Term Goals - 09/29/18 1544      PT SHORT TERM GOAL #1   Title  Patient will be independent in performance of initial HEP for carry over for pain control and additional strengthening to decreased pain and increase function.    Time  2    Period  Weeks    Status  Achieved      PT SHORT TERM GOAL #2   Title  Patient will exhibit improved body mechanics in sitting and standing to perform activities at shoulder level and below.     Baseline  09/15/2018 - patient continues to problem solve problem areas at home.    Time  3    Period  Weeks    Status  Achieved      PT SHORT TERM GOAL #3   Title  Patient will exhibit 1/2 grade MMT improvement in bilateral LE to indicate decreased stresses to LBP to decrease pain and improve functional abilities.     Baseline  *see MMT    Time  3    Period  Weeks    Status  Achieved        PT Long Term Goals - 09/29/18 1545      PT LONG TERM GOAL #1   Title  Patient will be independent in performance of advanced HEP for carry over for pain control and additional strengthening after DC from PT.    Time  6    Period  Weeks    Status  Achieved      PT LONG TERM GOAL #2   Title  Patient will exhibit 4+/5 or >  MMT in bilateral LE to indicate decreased stresses to  LBP to decrease pain and improve functional abilities.     Baseline  *see MMT    Time  6    Period  Weeks    Status  Achieved      PT LONG TERM GOAL #3   Title  Patient will exhibit a 10% improvement in FOTO score  indicating an improvement in perceived functional abilities.    Baseline  initial _ FOTO 57% limited; 09/15/2018 - 47% limited    Time  6    Period  Weeks    Status  Achieved      PT LONG TERM GOAL #4   Title  Patient will exhibit improved body mechanics in sitting and standing to perform activities at shoulder level and above and for lifting smaller objects from a lower surface.     Time  6    Period  Weeks    Status  Achieved            Plan - 09/29/18 1604    Clinical Impression Statement  Pt comes today stating no pain or diffiuculty completing all actvities.  States she is only limited by her breathing and feels this will get better with conitnued aquatic therapy at the [facility].  Pt is independent with HEP and no longer is in need of skilled PT.  Pt has met all her goals at this point.      Rehab Potential  Fair    Clinical Impairments Affecting Rehab Potential  positive - motivated to move, willing to learn; negative - spondylolisthesis L5-S1, body composition    PT Frequency  2x / week    PT Duration  6 weeks    PT Treatment/Interventions  Gait training;DME Instruction;Stair training;Functional mobility training;Therapeutic activities;Therapeutic exercise;Balance training;Neuromuscular re-education;Patient/family education;Manual techniques;Passive range of motion;Dry needling;Energy conservation;Taping;Joint Manipulations;Spinal Manipulations    PT Next Visit Plan  discharge per goals met and patient independent with all ADLS without pain.    PT Home Exercise Plan  body mechanics washing dishes and sitting to peel potatoes/apples, supine bridge; 08/13/18: Abdominal set 20x 5'' holds 1x/day; 09/22/18: heel/toe raises, SLS and tandem stance       Patient will benefit from skilled therapeutic intervention in order to improve the following deficits and impairments:  Abnormal gait, Difficulty walking, Decreased activity tolerance, Pain, Improper body mechanics, Decreased strength  Visit  Diagnosis: Chronic bilateral low back pain with bilateral sciatica  Muscle weakness (generalized)  Other abnormalities of gait and mobility  Other symptoms and signs involving the musculoskeletal system     Problem List Patient Active Problem List   Diagnosis Date Noted  . Acute bronchitis 07/23/2018  . Chronic right shoulder pain 07/05/2018  . Intermittent knee pain 05/28/2018  . Vitamin D deficiency 11/22/2015  . Back pain with left-sided radiculopathy 09/15/2013  . Carpal tunnel syndrome on left 07/31/2011  . IGT (impaired glucose tolerance) 10/16/2007  . Hyperlipidemia LDL goal <100 10/16/2007  . Morbid obesity ([place]) 10/16/2007  . Anxiety and depression 10/16/2007  . Essential hypertension 10/16/2007  . Pulmonary hypertension ([place]) 10/16/2007  . GERD 10/16/2007  . DIVERTICULOSIS OF COLON 10/16/2007  . OSTEOARTHRITIS, KNEES, 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   Teena Irani, PTA/CLT [phone]  Teena Irani 09/29/2018, 4:08 PM  [facility] [address] [place], [place], [address] Phone: [phone]   Fax:  [phone]  Name: [name] MRN: [id number] Date of Birth: Dec 20, 1945

## 2018-10-01 ENCOUNTER — Ambulatory Visit (HOSPITAL_COMMUNITY): Payer: Medicare Other | Admitting: Physical Therapy

## 2018-10-03 DIAGNOSIS — J984 Other disorders of lung: Secondary | ICD-10-CM | POA: Diagnosis not present

## 2018-10-05 ENCOUNTER — Other Ambulatory Visit: Payer: Self-pay | Admitting: Orthopedic Surgery

## 2018-10-09 NOTE — Addendum Note (Signed)
Addended by: Epifanio Lesches on: 10/09/2018 02:23 PM   Modules accepted: Orders

## 2018-10-26 ENCOUNTER — Other Ambulatory Visit: Payer: Self-pay

## 2018-10-26 ENCOUNTER — Ambulatory Visit: Payer: Medicare Other | Admitting: Orthopedic Surgery

## 2018-10-26 ENCOUNTER — Ambulatory Visit (INDEPENDENT_AMBULATORY_CARE_PROVIDER_SITE_OTHER): Payer: Medicare Other | Admitting: Family Medicine

## 2018-10-26 ENCOUNTER — Encounter: Payer: Self-pay | Admitting: Family Medicine

## 2018-10-26 VITALS — BP 170/86 | Ht 60.0 in | Wt 207.0 lb

## 2018-10-26 DIAGNOSIS — Z9181 History of falling: Secondary | ICD-10-CM | POA: Insufficient documentation

## 2018-10-26 DIAGNOSIS — I10 Essential (primary) hypertension: Secondary | ICD-10-CM | POA: Diagnosis not present

## 2018-10-26 DIAGNOSIS — F329 Major depressive disorder, single episode, unspecified: Secondary | ICD-10-CM

## 2018-10-26 DIAGNOSIS — F419 Anxiety disorder, unspecified: Secondary | ICD-10-CM | POA: Diagnosis not present

## 2018-10-26 DIAGNOSIS — I272 Pulmonary hypertension, unspecified: Secondary | ICD-10-CM

## 2018-10-26 DIAGNOSIS — F32A Depression, unspecified: Secondary | ICD-10-CM

## 2018-10-26 MED ORDER — LOSARTAN POTASSIUM-HCTZ 100-12.5 MG PO TABS: 1.0000 | ORAL_TABLET | Freq: Every day | ORAL | 1 refills | Status: DC

## 2018-10-26 NOTE — Assessment & Plan Note (Signed)
Managed by pulmonary , remains on maintenance treatment and is stable based on history

## 2018-10-26 NOTE — Assessment & Plan Note (Signed)
Reassessment formally needed at next visit, denied symptoms of uncontrolled depression and anxiety

## 2018-10-26 NOTE — Assessment & Plan Note (Signed)
Remains uncontrolled based on record review, needs clinic eval in next 6 weeks hopefully DASH diet and commitment to daily physical activity for a minimum of 30 minutes discussed and encouraged, as a part of hypertension management. The importance of attaining a healthy weight is also discussed.  BP/Weight 10/26/2018 08/28/2018 07/27/2018 07/23/2018 06/25/2018 05/28/2018 03/16/2018  Systolic BP 170 215 164 138 130 170 130  Diastolic BP 86 96 84 78 78 84 84  Wt. (Lbs) 207 198 200 200.4 201.04 204 199  BMI 40.43 38.67 39.06 39.14 39.26 39.84 38.86

## 2018-10-26 NOTE — Assessment & Plan Note (Signed)
Deteriorated.  Patient re-educated about  the importance of commitment to a  minimum of 150 minutes of exercise per week as able.  The importance of healthy food choices with portion control discussed, as well as eating regularly and within a 12 hour window most days. The need to choose "clean , green" food 50 to 75% of the time is discussed, as well as to make water the primary drink and set a goal of 64 ounces water daily.  Encouraged to start a food diary,  and to consider  joining a support group. Sample diet sheets offered. Goals set by the patient for the next several months.   Weight /BMI 10/26/2018 08/28/2018 07/27/2018  WEIGHT 207 lb 198 lb 200 lb  HEIGHT 5\' 0"  5\' 0"  5\' 0"   BMI 40.43 kg/m2 38.67 kg/m2 39.06 kg/m2    expolained that weight loss will improve overall health, including her BP

## 2018-10-26 NOTE — Progress Notes (Signed)
Virtual Visit via Telephone Note  I connected with [name] on 10/26/18 at  3:40 PM EDT by telephone and verified that I am speaking with the correct person using two identifiers.   I discussed the limitations, risks, security and privacy concerns of performing an evaluation and management service by telephone and the availability of in person appointments. I also discussed with the patient that there may be a patient responsible charge related to this service. The patient expressed understanding and agreed to proceed. Patient is in her home and I am at the office ,  Direct visual contact desired but no capability to proceed with this currently   History of Present Illness: F/u chronic problems and med review. No new complaints or concerns Denies recent fever or chills. Denies sinus pressure, nasal congestion, ear pain or sore throat. Denies chest congestion, productive cough or wheezing. Denies chest pains, palpitations and leg swelling Denies abdominal pain, nausea, vomiting,diarrhea or constipation.   Denies dysuria, frequency, hesitancy Denies uncontrolled  joint pain, swelling and limitation in mobility. Denies headaches, seizures, numbness, or tingling. Denies uncontrolled depression, anxiety or insomnia. Denies skin break down or rash.       Observations/Objective: BP (!) 170/86 Comment: from history  Ht 5' (1.524 m)   Wt 207 lb (93.9 kg)   BMI 40.43 kg/m    Assessment and Plan: Essential hypertension Remains uncontrolled based on record review, needs clinic eval in next 6 weeks hopefully DASH diet and commitment to daily physical activity for a minimum of 30 minutes discussed and encouraged, as a part of hypertension management. The importance of attaining a healthy weight is also discussed.  BP/Weight 10/26/2018 08/28/2018 07/27/2018 07/23/2018 06/25/2018 05/28/2018 03/16/2018  Systolic BP 170 215 164 138 130 170 130  Diastolic BP 86 96 84 78 78 84 84  Wt. (Lbs) 207 198  200 200.4 201.04 204 199  BMI 40.43 38.67 39.06 39.14 39.26 39.84 38.86       Pulmonary hypertension Managed by pulmonary , remains on maintenance treatment and is stable based on history  Morbid obesity (HCC) Deteriorated.  Patient re-educated about  the importance of commitment to a  minimum of 150 minutes of exercise per week as able.  The importance of healthy food choices with portion control discussed, as well as eating regularly and within a 12 hour window most days. The need to choose "clean , green" food 50 to 75% of the time is discussed, as well as to make water the primary drink and set a goal of 64 ounces water daily.  Encouraged to start a food diary,  and to consider  joining a support group. Sample diet sheets offered. Goals set by the patient for the next several months.   Weight /BMI 10/26/2018 08/28/2018 07/27/2018  WEIGHT 207 lb 198 lb 200 lb  HEIGHT 5\' 0"  5\' 0"  5\' 0"   BMI 40.43 kg/m2 38.67 kg/m2 39.06 kg/m2    expolained that weight loss will improve overall health, including her BP  Anxiety and depression Reassessment formally needed at next visit, denied symptoms of uncontrolled depression and anxiety  History of recent fall seen in [place] in past 2 months for a fall in a  Store, suffered no fracture bu had direct chest trauma. Reviewed fall risk reduction and home safety. States he is  recovered from the fall at this time    Follow Up Instructions:    I discussed the assessment and treatment plan with the patient. The patient was provided  an opportunity to ask questions and all were answered. The patient agreed with the plan and demonstrated an understanding of the instructions.   The patient was advised to call back or seek an in-person evaluation if the symptoms worsen or if the condition fails to improve as anticipated.  I provided 22 minutes of non-face-to-face time during this encounter.   Syliva Overman, MD

## 2018-10-26 NOTE — Assessment & Plan Note (Signed)
seen in [name] in past 2 months for a fall in a  Store, suffered no fracture bu had direct chest trauma. Reviewed fall risk reduction and home safety. States he is  recovered from the fall at this time

## 2018-10-26 NOTE — Patient Instructions (Addendum)
Annual physical exam June 19 or after, also BP re evaluation at that visit, call if you need me before  Wellness with nurse and flu vaccine 08/27 or shortly after please  Please get fasting labs that have already been ordered, and you do have the lab sheet, no later than the las week in May  Please cut  back on fried and fatty foods , sweets and sugar to improve health, lower blood pressure and weight  Please be careful not to fall, thankful that you have recovered from recent fall.  No changes in medication at this time  Social distancing. Frequent hand washing with soap and water Keeping your hands off of your face. These 3 practices will help to keep both you and your community healthy during this time. Please practice them faithfully!   Thanks for choosing [facility], we consider it a privelige to serve you.

## 2018-11-03 DIAGNOSIS — J984 Other disorders of lung: Secondary | ICD-10-CM | POA: Diagnosis not present

## 2018-12-03 DIAGNOSIS — J984 Other disorders of lung: Secondary | ICD-10-CM | POA: Diagnosis not present

## 2018-12-18 DIAGNOSIS — E785 Hyperlipidemia, unspecified: Secondary | ICD-10-CM | POA: Diagnosis not present

## 2018-12-18 DIAGNOSIS — R7301 Impaired fasting glucose: Secondary | ICD-10-CM | POA: Diagnosis not present

## 2018-12-19 LAB — LIPID PANEL
Cholesterol: 170 mg/dL (ref <200)
HDL: 64 mg/dL (ref >=50)
LDL Cholesterol (Calc): 89 mg/dL (calc)
NON-HDL CHOLESTEROL (CALC): 106 mg/dL (ref <130)
TRIGLYCERIDES: 77 mg/dL (ref <150)
Total CHOL/HDL Ratio: 2.7 (calc) (ref <5.0)

## 2018-12-19 LAB — COMPLETE METABOLIC PANEL WITH GFR
AG RATIO: 1.4 (calc) (ref 1.0–2.5)
ALT: 9 U/L (ref 6–29)
AST: 16 U/L (ref 10–35)
Albumin: 3.7 g/dL (ref 3.6–5.1)
Alkaline phosphatase (APISO): 63 U/L (ref 37–153)
BILIRUBIN TOTAL: 0.8 mg/dL (ref 0.2–1.2)
BUN: 14 mg/dL (ref 7–25)
CHLORIDE: 107 mmol/L (ref 98–110)
CO2: 28 mmol/L (ref 20–32)
Calcium: 9.3 mg/dL (ref 8.6–10.4)
Creat: 0.81 mg/dL (ref 0.60–0.93)
GFR, Est African American: 85 mL/min/{1.73_m2} (ref >=60)
GFR, Est Non African American: 74 mL/min/{1.73_m2} (ref >=60)
Globulin: 2.7 g/dL (calc) (ref 1.9–3.7)
Glucose, Bld: 105 mg/dL — ABNORMAL HIGH (ref 65–99)
POTASSIUM: 3.4 mmol/L — AB (ref 3.5–5.3)
Sodium: 145 mmol/L (ref 135–146)
TOTAL PROTEIN: 6.4 g/dL (ref 6.1–8.1)

## 2018-12-19 LAB — HEMOGLOBIN A1C
Hgb A1c MFr Bld: 5.8 % of total Hgb — ABNORMAL HIGH (ref <5.7)
Mean Plasma Glucose: 120 (calc)
eAG (mmol/L): 6.6 (calc)

## 2019-01-03 DIAGNOSIS — J984 Other disorders of lung: Secondary | ICD-10-CM | POA: Diagnosis not present

## 2019-01-12 ENCOUNTER — Other Ambulatory Visit: Payer: Self-pay

## 2019-01-12 ENCOUNTER — Encounter (INDEPENDENT_AMBULATORY_CARE_PROVIDER_SITE_OTHER): Payer: Self-pay

## 2019-01-12 ENCOUNTER — Encounter: Payer: Self-pay | Admitting: Family Medicine

## 2019-01-12 ENCOUNTER — Ambulatory Visit (INDEPENDENT_AMBULATORY_CARE_PROVIDER_SITE_OTHER): Payer: Medicare Other | Admitting: Family Medicine

## 2019-01-12 VITALS — BP 142/92 | HR 81 | Temp 98.0°F | Resp 15 | Ht 60.0 in | Wt 205.0 lb

## 2019-01-12 DIAGNOSIS — Z1231 Encounter for screening mammogram for malignant neoplasm of breast: Secondary | ICD-10-CM

## 2019-01-12 DIAGNOSIS — H1013 Acute atopic conjunctivitis, bilateral: Secondary | ICD-10-CM | POA: Diagnosis not present

## 2019-01-12 DIAGNOSIS — E785 Hyperlipidemia, unspecified: Secondary | ICD-10-CM

## 2019-01-12 DIAGNOSIS — I10 Essential (primary) hypertension: Secondary | ICD-10-CM

## 2019-01-12 DIAGNOSIS — E559 Vitamin D deficiency, unspecified: Secondary | ICD-10-CM

## 2019-01-12 DIAGNOSIS — Z0000 Encounter for general adult medical examination without abnormal findings: Secondary | ICD-10-CM

## 2019-01-12 MED ORDER — OLOPATADINE HCL 0.1 % OP SOLN: 1.0000 [drp] | Freq: Two times a day (BID) | OPHTHALMIC | 6 refills | Status: DC

## 2019-01-12 MED ORDER — AMLODIPINE BESYLATE 10 MG PO TABS: 10.0000 mg | ORAL_TABLET | Freq: Every day | ORAL | 3 refills | Status: DC

## 2019-01-12 NOTE — Patient Instructions (Addendum)
F/U end September, flu vaccine at that visit, call if you need me sooner  Increased dose of amlodipine to 10 mg once daily, you may take TWO 5 mg tablets together until you get the new higher dose , then take ONE 10 mg tablet once daily  Patanol eye drops are prescribed for itchy eyes, use as directed, when needed  Start bike riding for at least 30 minutes every day  Increase vegetables and reduce starches and sweets to help with health and weight management  Fasting lipid, cmp and eGFr, TSH, CBC and vit D 1 week before September appointment  Social distancing. Frequent hand washing with soap and water Keeping your hands off of your face. These 3 practices will help to keep both you and your community healthy during this time. Please practice them faithfully!   Thanks for choosing [facility], we consider it a privelige to serve you.

## 2019-01-12 NOTE — Progress Notes (Signed)
[name]     MRN: [id number]      DOB: [birth year]  HPI: Patient is in for annual physical exam. Low back pain radiating to left side, has had PT earlier in the year which was helpful, has not been doing home PT and plans to start, states the PT helped her pain and will start riding her bike at home since she is unable to go to exercise class and misses this C/o itchy eyes Recent labs,  reviewed. Immunization is reviewed    PE: BP (!) 142/92   Pulse 81   Temp 98 F (36.7 C) (Temporal)   Resp 15   Ht 5' (1.524 m)   Wt 205 lb (93 kg)   SpO2 98%   BMI 40.04 kg/m   Pleasant  female, alert and oriented x 3, in no cardio-pulmonary distress. Afebrile. HEENT No facial trauma or asymetry. Sinuses non tender.  Extra occullar muscles intact, pupils equally reactive to light.Conjunctiva mildly erythematous with excess watering External ears normal, tympanic membranes clear. Oropharynx moist, no exudate. Neck: supple, no adenopathy,JVD or thyromegaly.No bruits.  Chest: Clear to ascultation bilaterally.No crackles or wheezes. Non tender to palpation  Breast: Not examined, mammogram is up to date and normal  Cardiovascular system; Heart sounds normal,  S1 and  S2 ,no S3.  No murmur, or thrill.  Peripheral pulses normal.  Abdomen: Soft, non tender .   Musculoskeletal exam: Decreased  ROM of spine, hips , shoulders and knees.Mild deformity ,swelling and  crepitus noted. No muscle wasting or atrophy.   Neurologic: Cranial nerves 2 to 12 intact. Power, tone ,sensation normal throughout.  No tremor.  Skin: Intact, no ulceration, erythema , scaling or rash noted. Pigmentation normal throughout  Psych; Normal mood and affect. Judgement and concentration normal   Assessment & Plan:  Annual physical exam Annual exam as documented. Counseling done  re healthy lifestyle involving commitment to 150 minutes exercise per week, heart healthy diet, and attaining healthy  weight.The importance of adequate sleep also discussed. . Changes in health habits are decided on by the patient with goals and time frames  set for achieving them. Immunization and cancer screening needs are specifically addressed at this visit.   Essential hypertension Uncontrolled, increase dose of amlodipine and re evaluate DASH diet and commitment to daily physical activity for a minimum of 30 minutes discussed and encouraged, as a part of hypertension management. The importance of attaining a healthy weight is also discussed.  BP/Weight 01/12/2019 10/26/2018 08/28/2018 07/27/2018 07/23/2018 06/25/2018 05/28/2018  Systolic BP 142 170 215 164 138 130 170  Diastolic BP 92 86 96 84 78 78 84  Wt. (Lbs) 205 207 198 200 200.4 201.04 204  BMI 40.04 40.43 38.67 39.06 39.14 39.26 39.84       Morbid obesity (HCC)  Patient re-educated about  the importance of commitment to a  minimum of 150 minutes of exercise per week as able.  The importance of healthy food choices with portion control discussed, as well as eating regularly and within a 12 hour window most days. The need to choose "clean , green" food 50 to 75% of the time is discussed, as well as to make water the primary drink and set a goal of 64 ounces water daily.    Weight /BMI 01/12/2019 10/26/2018 08/28/2018  WEIGHT 205 lb 207 lb 198 lb  HEIGHT 5\' 0"  5\' 0"  5\' 0"   BMI 40.04 kg/m2 40.43 kg/m2 38.67 kg/m2  Allergic conjunctivitis Patanol prescribed and advised against scratching eyes

## 2019-01-17 ENCOUNTER — Encounter: Payer: Self-pay | Admitting: Family Medicine

## 2019-01-17 DIAGNOSIS — H1010 Acute atopic conjunctivitis, unspecified eye: Secondary | ICD-10-CM | POA: Insufficient documentation

## 2019-01-17 NOTE — Assessment & Plan Note (Signed)
  Patient re-educated about  the importance of commitment to a  minimum of 150 minutes of exercise per week as able.  The importance of healthy food choices with portion control discussed, as well as eating regularly and within a 12 hour window most days. The need to choose "clean , green" food 50 to 75% of the time is discussed, as well as to make water the primary drink and set a goal of 64 ounces water daily.    Weight /BMI 01/12/2019 10/26/2018 08/28/2018  WEIGHT 205 lb 207 lb 198 lb  HEIGHT 5\' 0"  5\' 0"  5\' 0"   BMI 40.04 kg/m2 40.43 kg/m2 38.67 kg/m2

## 2019-01-17 NOTE — Assessment & Plan Note (Signed)
Patanol prescribed and advised against scratching eyes

## 2019-01-17 NOTE — Assessment & Plan Note (Signed)
Annual exam as documented. Counseling done  re healthy lifestyle involving commitment to 150 minutes exercise per week, heart healthy diet, and attaining healthy weight.The importance of adequate sleep also discussed. Changes in health habits are decided on by the patient with goals and time frames  set for achieving them. Immunization and cancer screening needs are specifically addressed at this visit. 

## 2019-01-17 NOTE — Assessment & Plan Note (Signed)
Uncontrolled, increase dose of amlodipine and re evaluate DASH diet and commitment to daily physical activity for a minimum of 30 minutes discussed and encouraged, as a part of hypertension management. The importance of attaining a healthy weight is also discussed.  BP/Weight 01/12/2019 10/26/2018 08/28/2018 07/27/2018 07/23/2018 06/25/2018 50/08/7739  Systolic BP 287 867 672 094 709 628 366  Diastolic BP 92 86 96 84 78 78 84  Wt. (Lbs) 205 207 198 200 200.4 201.04 204  BMI 40.04 40.43 38.67 39.06 39.14 39.26 39.84

## 2019-02-02 DIAGNOSIS — J984 Other disorders of lung: Secondary | ICD-10-CM | POA: Diagnosis not present

## 2019-02-03 ENCOUNTER — Other Ambulatory Visit: Payer: Self-pay

## 2019-02-03 NOTE — Patient Outreach (Signed)
[facility]) Care Management  02/03/2019  [name] 01-28-1946 [id number]   Medication Adherence call to [name] [facility] Identifiers Verify spoke with patient she is past due on Atorvastatin 20 mg patient explain she is taking 1 tablet daily but ask if we can call [facility] an order this medication and all of her medication that are due patient will received all her medication with in 5-7 days from [facility]. [name] is showing past due under [place].  [place] [facility] Direct Dial [phone]  Fax [phone] [email or web address]

## 2019-03-03 ENCOUNTER — Other Ambulatory Visit: Payer: Self-pay | Admitting: Family Medicine

## 2019-03-05 DIAGNOSIS — J984 Other disorders of lung: Secondary | ICD-10-CM | POA: Diagnosis not present

## 2019-03-19 ENCOUNTER — Other Ambulatory Visit: Payer: Self-pay

## 2019-03-19 ENCOUNTER — Encounter: Payer: Self-pay | Admitting: Family Medicine

## 2019-03-19 ENCOUNTER — Ambulatory Visit (INDEPENDENT_AMBULATORY_CARE_PROVIDER_SITE_OTHER): Payer: Medicare Other | Admitting: Family Medicine

## 2019-03-19 ENCOUNTER — Encounter (INDEPENDENT_AMBULATORY_CARE_PROVIDER_SITE_OTHER): Payer: Self-pay

## 2019-03-19 VITALS — BP 142/92 | HR 81 | Temp 98.0°F | Resp 15 | Ht 60.0 in | Wt 205.0 lb

## 2019-03-19 DIAGNOSIS — Z0000 Encounter for general adult medical examination without abnormal findings: Secondary | ICD-10-CM

## 2019-03-19 NOTE — Patient Instructions (Signed)
[name] , Thank you for taking time to come for your Medicare Wellness Visit. I appreciate your ongoing commitment to your health goals. Please review the following plan we discussed and let me know if I can assist you in the future.   Please continue to practice social distancing to keep you, your family, and our community safe.  If you must go out, please wear a Mask and practice good handwashing.  Screening recommendations/referrals: Colonoscopy: Due 2025 Mammogram:  Up to date Bone Density: Up to date Recommended yearly ophthalmology/optometry visit for glaucoma screening and checkup Recommended yearly dental visit for hygiene and checkup  Vaccinations: Influenza vaccine: Need to schedule Does have appt in Sept that she can get flu shot at Pneumococcal vaccine: Completed Tdap vaccine: Check with insurance coverage Shingles vaccine: Completed  Advanced directives: Declined paperwork to be mailed, you will pick up in the office  Conditions/risks identified: Fall   Next appointment: 04/21/2019 (Flu shot)   Preventive Care 65 Years and Older, Female Preventive care refers to lifestyle choices and visits with your health care provider that can promote health and wellness. What does preventive care include?  A yearly physical exam. This is also called an annual well check.  Dental exams once or twice a year.  Routine eye exams. Ask your health care provider how often you should have your eyes checked.  Personal lifestyle choices, including:  Daily care of your teeth and gums.  Regular physical activity.  Eating a healthy diet.  Avoiding tobacco and drug use.  Limiting alcohol use.  Practicing safe sex.  Taking low-dose aspirin every day.  Taking vitamin and mineral supplements as recommended by your health care provider. What happens during an annual well check? The services and screenings done by your health care provider during your annual well check will  depend on your age, overall health, lifestyle risk factors, and family history of disease. Counseling  Your health care provider may ask you questions about your:  Alcohol use.  Tobacco use.  Drug use.  Emotional well-being.  Home and relationship well-being.  Sexual activity.  Eating habits.  History of falls.  Memory and ability to understand (cognition).  Work and work Statistician.  Reproductive health. Screening  You may have the following tests or measurements:  Height, weight, and BMI.  Blood pressure.  Lipid and cholesterol levels. These may be checked every 5 years, or more frequently if you are over 69 years old.  Skin check.  Lung cancer screening. You may have this screening every year starting at age 75 if you have a 30-pack-year history of smoking and currently smoke or have quit within the past 15 years.  Fecal occult blood test (FOBT) of the stool. You may have this test every year starting at age 16.  Flexible sigmoidoscopy or colonoscopy. You may have a sigmoidoscopy every 5 years or a colonoscopy every 10 years starting at age 41.  Hepatitis C blood test.  Hepatitis B blood test.  Sexually transmitted disease (STD) testing.  Diabetes screening. This is done by checking your blood sugar (glucose) after you have not eaten for a while (fasting). You may have this done every 1-3 years.  Bone density scan. This is done to screen for osteoporosis. You may have this done starting at age 27.  Mammogram. This may be done every 1-2 years. Talk to your health care provider about how often you should have regular mammograms. Talk with your health care provider about your test results,  treatment options, and if necessary, the need for more tests. Vaccines  Your health care provider may recommend certain vaccines, such as:  Influenza vaccine. This is recommended every year.  Tetanus, diphtheria, and acellular pertussis (Tdap, Td) vaccine. You may need a  Td booster every 10 years.  Zoster vaccine. You may need this after age 52.  Pneumococcal 13-valent conjugate (PCV13) vaccine. One dose is recommended after age 68.  Pneumococcal polysaccharide (PPSV23) vaccine. One dose is recommended after age 14. Talk to your health care provider about which screenings and vaccines you need and how often you need them. This information is not intended to replace advice given to you by your health care provider. Make sure you discuss any questions you have with your health care provider. Document Released: 08/04/2015 Document Revised: 03/27/2016 Document Reviewed: 05/09/2015 Elsevier Interactive Patient Education  2017 [employer] Prevention in the Home Falls can cause injuries. They can happen to people of all ages. There are many things you can do to make your home safe and to help prevent falls. What can I do on the outside of my home?  Regularly fix the edges of walkways and driveways and fix any cracks.  Remove anything that might make you trip as you walk through a door, such as a raised step or threshold.  Trim any bushes or trees on the path to your home.  Use bright outdoor lighting.  Clear any walking paths of anything that might make someone trip, such as rocks or tools.  Regularly check to see if handrails are loose or broken. Make sure that both sides of any steps have handrails.  Any raised decks and porches should have guardrails on the edges.  Have any leaves, snow, or ice cleared regularly.  Use sand or salt on walking paths during winter.  Clean up any spills in your garage right away. This includes oil or grease spills. What can I do in the bathroom?  Use night lights.  Install grab bars by the toilet and in the tub and shower. Do not use towel bars as grab bars.  Use non-skid mats or decals in the tub or shower.  If you need to sit down in the shower, use a plastic, non-slip stool.  Keep the floor dry. Clean  up any water that spills on the floor as soon as it happens.  Remove soap buildup in the tub or shower regularly.  Attach bath mats securely with double-sided non-slip rug tape.  Do not have throw rugs and other things on the floor that can make you trip. What can I do in the bedroom?  Use night lights.  Make sure that you have a light by your bed that is easy to reach.  Do not use any sheets or blankets that are too big for your bed. They should not hang down onto the floor.  Have a firm chair that has side arms. You can use this for support while you get dressed.  Do not have throw rugs and other things on the floor that can make you trip. What can I do in the kitchen?  Clean up any spills right away.  Avoid walking on wet floors.  Keep items that you use a lot in easy-to-reach places.  If you need to reach something above you, use a strong step stool that has a grab bar.  Keep electrical cords out of the way.  Do not use floor polish or wax that makes floors  slippery. If you must use wax, use non-skid floor wax.  Do not have throw rugs and other things on the floor that can make you trip. What can I do with my stairs?  Do not leave any items on the stairs.  Make sure that there are handrails on both sides of the stairs and use them. Fix handrails that are broken or loose. Make sure that handrails are as long as the stairways.  Check any carpeting to make sure that it is firmly attached to the stairs. Fix any carpet that is loose or worn.  Avoid having throw rugs at the top or bottom of the stairs. If you do have throw rugs, attach them to the floor with carpet tape.  Make sure that you have a light switch at the top of the stairs and the bottom of the stairs. If you do not have them, ask someone to add them for you. What else can I do to help prevent falls?  Wear shoes that:  Do not have high heels.  Have rubber bottoms.  Are comfortable and fit you well.  Are  closed at the toe. Do not wear sandals.  If you use a stepladder:  Make sure that it is fully opened. Do not climb a closed stepladder.  Make sure that both sides of the stepladder are locked into place.  Ask someone to hold it for you, if possible.  Clearly mark and make sure that you can see:  Any grab bars or handrails.  First and last steps.  Where the edge of each step is.  Use tools that help you move around (mobility aids) if they are needed. These include:  Canes.  Walkers.  Scooters.  Crutches.  Turn on the lights when you go into a dark area. Replace any light bulbs as soon as they burn out.  Set up your furniture so you have a clear path. Avoid moving your furniture around.  If any of your floors are uneven, fix them.  If there are any pets around you, be aware of where they are.  Review your medicines with your doctor. Some medicines can make you feel dizzy. This can increase your chance of falling. Ask your doctor what other things that you can do to help prevent falls. This information is not intended to replace advice given to you by your health care provider. Make sure you discuss any questions you have with your health care provider. Document Released: 05/04/2009 Document Revised: 12/14/2015 Document Reviewed: 08/12/2014 Elsevier Interactive Patient Education  2017 [employer].

## 2019-03-19 NOTE — Progress Notes (Signed)
Subjective:   [name] is a 73 y.o. female who presents for Medicare Annual (Subsequent) preventive examination.  Location of Patient: Home Location of Provider: Telehealth Consent was obtain for visit to be over via telehealth. I verified that I am speaking with the correct person using two identifiers.   Review of Systems:   Cardiac Risk Factors include: advanced age (>67men, >25 women);dyslipidemia;hypertension     Objective:     Vitals: There were no vitals taken for this visit.  There is no height or weight on file to calculate BMI.  Advanced Directives 03/19/2019 08/28/2018 08/25/2018 08/20/2018 08/19/2018 08/12/2018 03/12/2017  Does Patient Have a Medical Advance Directive? No No No No No No Yes  Type of Advance Directive - - - - - - Midwife  Does patient want to make changes to medical advance directive? - - - - - - Yes (MAU/Ambulatory/Procedural Areas - Information given)  Copy of Healthcare Power of Attorney in Chart? - - - - - - No - copy requested  Would patient like information on creating a medical advance directive? No - Patient declined No - Patient declined - - - - -  Pre-existing out of facility DNR order (yellow form or pink MOST form) - - - - - - -    Tobacco Social History   Tobacco Use  Smoking Status Never Smoker  Smokeless Tobacco Never Used     Counseling given: Not Answered   Clinical Intake:  Pre-visit preparation completed: No  Pain : No/denies pain        How often do you need to have someone help you when you read instructions, pamphlets, or other written materials from your doctor or pharmacy?: 1 - Never What is the last grade level you completed in school?: 12  Interpreter Needed?: No     Past Medical History:  Diagnosis Date  . Asthma   . Back pain   . Depression   . Diverticulosis of colon   . GERD (gastroesophageal reflux disease)   . Hyperlipidemia   . Hypertension   . Obesity   .  Osteoarthrosis, unspecified whether generalized or localized, lower leg    bilateral knees   . Pulmonary hypertension (HCC)    Past Surgical History:  Procedure Laterality Date  . ABDOMINAL HYSTERECTOMY    . COLONOSCOPY  2005   Dr. Jerolyn Shin Smith:numerous large scattered diverticula  . COLONOSCOPY N/A 11/15/2013   Dr. Darrick Penna: moderate diverticula, small internal hemorrhoids, redudant colon. Next TCS in 2025 with overtube.   . ESOPHAGOGASTRODUODENOSCOPY (EGD) WITH ESOPHAGEAL DILATION N/A 11/15/2013   Dr. 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 moderate erosive gastritis, negative H.pylori  . TOTAL KNEE ARTHROPLASTY  06/01/04   left / Dr. Romeo Apple  . TOTAL KNEE ARTHROPLASTY  11/29/03   right / Dr. Romeo Apple  . TUBAL LIGATION     Family History  Problem Relation Age of Onset  . Aneurysm Father   . Diabetes Father   . Diabetes Mother   . Hypertension Mother   . Heart disease Mother   . Hypertension Brother   . Diabetes Brother   . Diabetes Brother   . Hypertension Brother   . Colon cancer Maternal Uncle   . Hypertension Son    Social History   Socioeconomic History  . Marital status: Married    Spouse name: Not on file  . Number of children: 2  . Years of education: 55  . Highest education level:  Not on file  Occupational History  . Occupation: retired     Associate Professormployer: UNEMPLOYED  Social Needs  . Financial resource strain: Not hard at all  . Food insecurity    Worry: Never true    Inability: Never true  . Transportation needs    Medical: No    Non-medical: No  Tobacco Use  . Smoking status: Never Smoker  . Smokeless tobacco: Never Used  Substance and Sexual Activity  . Alcohol use: No  . Drug use: No  . Sexual activity: Never    Birth control/protection: Surgical  Lifestyle  . Physical activity    Days per week: 3 days    Minutes per session: 60 min  . Stress: Not at all  Relationships  . Social connections    Talks on phone: More than three times a week     Gets together: More than three times a week    Attends religious service: More than 4 times per year    Active member of club or organization: Yes    Attends meetings of clubs or organizations: 1 to 4 times per year    Relationship status: Married  Other Topics Concern  . Not on file  Social History Narrative  . Not on file    Outpatient Encounter Medications as of 03/19/2019  Medication Sig  . alendronate (FOSAMAX) 70 MG tablet TAKE 1 TABLET BY MOUTH  EVERY 7 DAYS WITH A FULL  GLASS OF WATER ON AN EMPTY  STOMACH  . amLODipine (NORVASC) 10 MG tablet Take 1 tablet (10 mg total) by mouth daily.  Marland Kitchen. aspirin (ASPIRIN LOW DOSE) 81 MG EC tablet Take 81 mg by mouth daily. Take one tablet by mouth once a day   . atorvastatin (LIPITOR) 20 MG tablet TAKE 1 TABLET BY MOUTH EVERY DAY  . Calcium Carbonate-Vit D-Min (CALCIUM 600 + MINERALS) 600-200 MG-UNIT TABS Take 1 tablet by mouth 3 (three) times daily.   Marland Kitchen. gabapentin (NEURONTIN) 300 MG capsule TAKE 1 CAPSULE BY MOUTH AT  BEDTIME  . ibuprofen (ADVIL) 600 MG tablet Take 600 mg by mouth every 6 (six) hours as needed.  . metoprolol succinate (TOPROL-XL) 50 MG 24 hr tablet Take 1 tablet (50 mg total) by mouth daily. Take with or immediately following a meal.  . olopatadine (PATANOL) 0.1 % ophthalmic solution Place 1 drop into both eyes 2 (two) times daily.  Marland Kitchen. omeprazole (PRILOSEC) 20 MG capsule TAKE 1 CAPSULE BY MOUTH  DAILY  . PROAIR HFA 108 (90 Base) MCG/ACT inhaler USE 2 PUFFS INTO LUNGS EVERY 6 HOURS AS NEEDED FOR WHEEZING OR SHORTNESS OF BREATH  . pyridOXINE (VITAMIN B-6) 100 MG tablet Take 200 mg by mouth daily.  . sertraline (ZOLOFT) 100 MG tablet Take 1 tablet (100 mg total) by mouth daily.  . sildenafil (REVATIO) 20 MG tablet Take 60 mg by mouth daily.   . traMADol (ULTRAM) 50 MG tablet Take 1 tablet (50 mg total) by mouth every 6 (six) hours as needed.  . [DISCONTINUED] losartan-hydrochlorothiazide (HYZAAR) 100-12.5 MG tablet Take 1 tablet by  mouth daily.   No facility-administered encounter medications on file as of 03/19/2019.     Activities of Daily Living In your present state of health, do you have any difficulty performing the following activities: 03/19/2019  Hearing? N  Vision? N  Difficulty concentrating or making decisions? N  Walking or climbing stairs? N  Dressing or bathing? N  Doing errands, shopping? N  Preparing Food and eating ?  N  Using the Toilet? N  In the past six months, have you accidently leaked urine? N  Do you have problems with loss of bowel control? N  Managing your Medications? N  Managing your Finances? N  Housekeeping or managing your Housekeeping? N  Some recent data might be hidden    Patient Care Team: Kerri PerchesSimpson, Margaret E, MD as PCP - General Fields, Darleene CleaverSandi L, MD as Consulting Physician (Gastroenterology)    Assessment:   This is a routine wellness examination for Lafonda MossesDiana.  Exercise Activities and Dietary recommendations Current Exercise Habits: Structured exercise class, Type of exercise: Other - see comments(water aerobics), Time (Minutes): 45, Frequency (Times/Week): 3, Weekly Exercise (Minutes/Week): 135, Intensity: Moderate, Exercise limited by: cardiac condition(s);orthopedic condition(s)  Goals    . Increase water intake     Recommend increasing your water intake to 64 ounces a day.        Fall Risk Fall Risk  03/19/2019 01/12/2019 10/26/2018 07/23/2018 06/25/2018  Falls in the past year? 0 0 1 0 0  Number falls in past yr: 0 0 0 0 0  Injury with Fall? 0 0 1 0 0  Risk for fall due to : - - - - -  Follow up Falls evaluation completed;Education provided - - - -   Is the patient's home free of loose throw rugs in walkways, pet beds, electrical cords, etc?   yes      Grab bars in the bathroom? yes      Handrails on the stairs?   yes      Adequate lighting?   yes  Timed Get Up and Go performed: n/a telemed visit   Depression Screen PHQ 2/9 Scores 03/19/2019 01/12/2019 10/26/2018  07/23/2018  PHQ - 2 Score 0 0 1 0  PHQ- 9 Score - - - -     Cognitive Function     6CIT Screen 03/19/2019 03/16/2018 03/12/2017  What Year? 0 points 0 points 0 points  What month? 0 points 0 points 0 points  What time? 0 points 0 points 0 points  Count back from 20 0 points 0 points 0 points  Months in reverse 2 points 0 points 0 points  Repeat phrase 2 points 2 points 0 points  Total Score 4 2 0    Immunization History  Administered Date(s) Administered  . H1N1 05/23/2008  . Influenza Split 04/02/2012, 04/21/2014, 03/05/2016  . Influenza Whole 04/08/2007, 05/01/2009, 04/03/2010  . Influenza,inj,Quad PF,6+ Mos 03/30/2015, 03/12/2017, 03/16/2018  . Pneumococcal Conjugate-13 08/04/2014  . Pneumococcal Polysaccharide-23 01/16/2004, 06/12/2010, 09/14/2015  . Td 01/16/2004  . Zoster 07/20/2008    Qualifies for Shingles Vaccine? completed  Screening Tests Health Maintenance  Topic Date Due  . TETANUS/TDAP  01/15/2014  . INFLUENZA VACCINE  02/20/2019  . MAMMOGRAM  04/16/2020  . COLONOSCOPY  11/17/2023  . DEXA SCAN  Completed  . Hepatitis C Screening  Completed  . PNA vac Low Risk Adult  Completed    Cancer Screenings: Lung: Low Dose CT Chest recommended if Age 22-80 years, 30 pack-year currently smoking OR have quit w/in 15years. Patient does not qualify. Breast:  Up to date on Mammogram? Yes   Up to date of Bone Density/Dexa? Yes Colorectal: Due 2025  Additional Screenings:  Hepatitis C Screening: completed     Plan:      1. Encounter for Medicare annual wellness exam   I have personally reviewed and noted the following in the patient's chart:   . Medical and social  history . Use of alcohol, tobacco or illicit drugs  . Current medications and supplements . Functional ability and status . Nutritional status . Physical activity . Advanced directives . List of other physicians . Hospitalizations, surgeries, and ER visits in previous 12 months . Vitals .  Screenings to include cognitive, depression, and falls . Referrals and appointments  In addition, I have reviewed and discussed with patient certain preventive protocols, quality metrics, and best practice recommendations. A written personalized care plan for preventive services as well as general preventive health recommendations were provided to patient.   I provided 20 minutes of non-face-to-face time during this encounter.   Perlie Mayo, NP  03/19/2019

## 2019-03-24 ENCOUNTER — Other Ambulatory Visit: Payer: Self-pay | Admitting: Family Medicine

## 2019-03-30 ENCOUNTER — Other Ambulatory Visit: Payer: Self-pay

## 2019-03-30 ENCOUNTER — Encounter: Payer: Self-pay | Admitting: Family Medicine

## 2019-03-30 ENCOUNTER — Ambulatory Visit (INDEPENDENT_AMBULATORY_CARE_PROVIDER_SITE_OTHER): Payer: Medicare Other | Admitting: Family Medicine

## 2019-03-30 VITALS — BP 160/80 | HR 89 | Temp 98.2°F | Resp 12 | Ht 60.0 in | Wt 201.0 lb

## 2019-03-30 DIAGNOSIS — T148XXA Other injury of unspecified body region, initial encounter: Secondary | ICD-10-CM

## 2019-03-30 DIAGNOSIS — I10 Essential (primary) hypertension: Secondary | ICD-10-CM | POA: Diagnosis not present

## 2019-03-30 DIAGNOSIS — R239 Unspecified skin changes: Secondary | ICD-10-CM | POA: Insufficient documentation

## 2019-03-30 MED ORDER — NYSTATIN 100000 UNIT/GM EX POWD: Freq: Four times a day (QID) | CUTANEOUS | 0 refills | Status: DC

## 2019-03-30 MED ORDER — GERHARDT'S BUTT CREAM: 1.0000 "application " | TOPICAL_CREAM | Freq: Three times a day (TID) | CUTANEOUS | 1 refills | Status: AC

## 2019-03-30 NOTE — Patient Instructions (Signed)
  Thank you for coming into the office today. I appreciate the opportunity to provide you with the care for your health and wellness. Today we discussed:  Wound on buttock-moisture related  Follow-up: 04/21/2019  No labs or referrals  Today it appears that you have a moisture related wound to your left inner buttock.  Please apply the prescribed cream up to 3 times a day.  Do not scrub this area if the cream, sticks that is okay it is creating a barrier on your bottom to protect it from moisture.  Use the powder predominantly at night as you have a lot of moisture that can build up in the area.  Do not apply the powder before the cream or the cream will not stick.  If it develops into a knot, is warm, starts draining pus, or becomes significantly painful please call us back as this area is close to the bottom and can become infected.  Due to the area that is seeing it is hard to get a dressing or bandage to stick there.  If you would like you can cover it with gauze but this can also trap bacteria so I advised to keep it open and just keep yourself cleansed several times throughout the day.  Always clean your skin before applying the cream.  Please continue to practice social distancing to keep you, your family, and our community safe.  If you must go out, please wear a Mask and practice good handwashing.  [place] YOUR HANDS WELL AND FREQUENTLY. AVOID TOUCHING YOUR FACE, UNLESS YOUR HANDS ARE FRESHLY WASHED.  GET FRESH AIR DAILY. STAY HYDRATED WITH WATER.   It was a pleasure to see you and I look forward to continuing to work together on your health and well-being. Please do not hesitate to call the office if you need care or have questions about your care.  Have a wonderful day and week. With Gratitude, Cherly Beach, DNP, AGNP-BC

## 2019-03-30 NOTE — Progress Notes (Signed)
Subjective:     Patient ID: [name], female   DOB: Jan 21, 1946, 73 y.o.   MRN: [id number]  [name] presents for sore on left buttock New onset of sore of the left buttock a couple of days ago.  Reports that she thinks it is moisture related.  Reports that she sits in her pads and depends at times waiting on her husband to do certain things or doing things while she is out of the house.  This moisture builds up and can irritate her skin.  Additionally she does water aerobics and she is not allowed to shower and get changed she has to ride home in her bathing suit.  So it is a while before she can get that moisture away from her body as well.  Additionally the location of it might be related to rubbing the tissue against the skin if it has become rather moist and tender from extra water.  This could create the pressure type injury.  She denies having fever, chills, any other signs or symptoms of infection at this site.  There is no lumps no masses and no pain at this time.  She just can tell that there is something there.  Today patient denies signs and symptoms of COVID 19 infection including fever, chills, cough, shortness of breath, and headache.  Past Medical, Surgical, Social History, Allergies, and Medications have been Reviewed.   Past Medical History:  Diagnosis Date  . Asthma   . Back pain   . Depression   . Diverticulosis of colon   . GERD (gastroesophageal reflux disease)   . Hyperlipidemia   . Hypertension   . Obesity   . Osteoarthrosis, unspecified whether generalized or localized, lower leg    bilateral knees   . Pulmonary hypertension (HCC)    Past Surgical History:  Procedure Laterality Date  . ABDOMINAL HYSTERECTOMY    . COLONOSCOPY  2005   Dr. Jerolyn Shin Smith:numerous large scattered diverticula  . COLONOSCOPY N/A 11/15/2013   Dr. Darrick Penna: moderate diverticula, small internal hemorrhoids, redudant colon. Next TCS in 2025 with overtube.   .  ESOPHAGOGASTRODUODENOSCOPY (EGD) WITH ESOPHAGEAL DILATION N/A 11/15/2013   Dr. 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 moderate erosive gastritis, negative H.pylori  . TOTAL KNEE ARTHROPLASTY  06/01/04   left / Dr. Romeo Apple  . TOTAL KNEE ARTHROPLASTY  11/29/03   right / Dr. Romeo Apple  . TUBAL LIGATION     Social History   Socioeconomic History  . Marital status: Married    Spouse name: Not on file  . Number of children: 2  . Years of education: 69  . Highest education level: Not on file  Occupational History  . Occupation: retired     Associate Professor: UNEMPLOYED  Social Needs  . Financial resource strain: Not hard at all  . Food insecurity    Worry: Never true    Inability: Never true  . Transportation needs    Medical: No    Non-medical: No  Tobacco Use  . Smoking status: Never Smoker  . Smokeless tobacco: Never Used  Substance and Sexual Activity  . Alcohol use: No  . Drug use: No  . Sexual activity: Never    Birth control/protection: Surgical  Lifestyle  . Physical activity    Days per week: 3 days    Minutes per session: 60 min  . Stress: Not at all  Relationships  . Social connections    Talks on phone: More than three  times a week    Gets together: More than three times a week    Attends religious service: More than 4 times per year    Active member of club or organization: Yes    Attends meetings of clubs or organizations: 1 to 4 times per year    Relationship status: Married  . Intimate partner violence    Fear of current or ex partner: No    Emotionally abused: No    Physically abused: No    Forced sexual activity: No  Other Topics Concern  . Not on file  Social History Narrative  . Not on file    Outpatient Encounter Medications as of 03/30/2019  Medication Sig  . alendronate (FOSAMAX) 70 MG tablet TAKE 1 TABLET BY MOUTH  EVERY 7 DAYS WITH A FULL  GLASS OF WATER ON AN EMPTY  STOMACH  . amLODipine (NORVASC) 10 MG tablet Take 1 tablet (10 mg total) by  mouth daily.  Marland Kitchen. aspirin (ASPIRIN LOW DOSE) 81 MG EC tablet Take 81 mg by mouth daily. Take one tablet by mouth once a day   . atorvastatin (LIPITOR) 20 MG tablet TAKE 1 TABLET BY MOUTH EVERY DAY  . Calcium Carbonate-Vit D-Min (CALCIUM 600 + MINERALS) 600-200 MG-UNIT TABS Take 1 tablet by mouth 3 (three) times daily.   Marland Kitchen. gabapentin (NEURONTIN) 300 MG capsule TAKE 1 CAPSULE BY MOUTH AT  BEDTIME  . ibuprofen (ADVIL) 600 MG tablet Take 600 mg by mouth every 6 (six) hours as needed.  . metoprolol succinate (TOPROL-XL) 50 MG 24 hr tablet Take 1 tablet (50 mg total) by mouth daily. Take with or immediately following a meal.  . olopatadine (PATANOL) 0.1 % ophthalmic solution Place 1 drop into both eyes 2 (two) times daily.  Marland Kitchen. omeprazole (PRILOSEC) 20 MG capsule TAKE 1 CAPSULE BY MOUTH  DAILY  . PROAIR HFA 108 (90 Base) MCG/ACT inhaler USE 2 PUFFS INTO LUNGS EVERY 6 HOURS AS NEEDED FOR WHEEZING OR SHORTNESS OF BREATH  . pyridOXINE (VITAMIN B-6) 100 MG tablet Take 200 mg by mouth daily.  . sertraline (ZOLOFT) 100 MG tablet Take 1 tablet (100 mg total) by mouth daily.  . sildenafil (REVATIO) 20 MG tablet Take 60 mg by mouth daily.   . traMADol (ULTRAM) 50 MG tablet Take 1 tablet (50 mg total) by mouth every 6 (six) hours as needed.  . Hydrocortisone (GERHARDT'S BUTT CREAM) CREA Apply 1 application topically 3 (three) times daily.  Marland Kitchen. nystatin (MYCOSTATIN/NYSTOP) powder Apply topically 4 (four) times daily.  . [DISCONTINUED] losartan-hydrochlorothiazide (HYZAAR) 100-12.5 MG tablet Take 1 tablet by mouth daily.   No facility-administered encounter medications on file as of 03/30/2019.    No Known Allergies  Review of Systems  HENT: Negative.   Eyes: Negative.   Respiratory: Negative.   Cardiovascular: Negative.   Gastrointestinal: Negative.   Endocrine: Negative.   Genitourinary: Negative.   Musculoskeletal: Negative.   Skin: Positive for wound.  Allergic/Immunologic: Negative.   Neurological:  Negative.   Hematological: Negative.   Psychiatric/Behavioral: Negative.   All other systems reviewed and are negative.      Objective:     BP (!) 164/82   Pulse 89   Temp 98.2 F (36.8 C) (Oral)   Resp 12   Ht 5' (1.524 m)   Wt 201 lb (91.2 kg)   SpO2 94%   BMI 39.26 kg/m   Physical Exam Vitals signs and nursing note reviewed.  Constitutional:      Appearance:  Normal appearance. She is well-developed and well-groomed. She is obese.  HENT:     Head: Normocephalic and atraumatic.     Right Ear: External ear normal.     Left Ear: External ear normal.     Nose: Nose normal.     Mouth/Throat:     Mouth: Mucous membranes are moist.     Pharynx: Oropharynx is clear.  Eyes:     General:        Right eye: No discharge.        Left eye: No discharge.     Conjunctiva/sclera: Conjunctivae normal.  Neck:     Musculoskeletal: Normal range of motion and neck supple.  Cardiovascular:     Rate and Rhythm: Normal rate and regular rhythm.     Pulses: Normal pulses.     Heart sounds: Normal heart sounds.  Pulmonary:     Effort: Pulmonary effort is normal.     Breath sounds: Normal breath sounds.  Musculoskeletal: Normal range of motion.  Skin:    General: Skin is warm.     Findings: Signs of injury and wound present.     Comments: Left lower inner buttock-1/2-3/4 inch moisture associated injury/wound is noted. Flat, no drainage, no bump or masses, no warmth.   Neurological:     General: No focal deficit present.     Mental Status: She is alert and oriented to person, place, and time.  Psychiatric:        Attention and Perception: Attention normal.        Mood and Affect: Mood normal.        Speech: Speech normal.        Behavior: Behavior normal. Behavior is cooperative.        Thought Content: Thought content normal.        Cognition and Memory: Cognition normal.        Judgment: Judgment normal.        Assessment and Plan        1. Essential hypertension Not  controlled, reports that she has not taken any of her medications.  Additionally she reports that when she takes her blood pressure medications she has issues with incontinence and additional urination.  Wears a depends and pads which adds to her moisture collection.  Advised for her to take her medications when she gets home.  Will be talking to Dr. Moshe Cipro to see about adjustment of medications as needed.  But she definitely needs to be maintained on some form of blood pressure control.  Patient acknowledged agreement and understanding of the plan.    2. Alteration in skin integrity due to moisture Alteration in skin integrity secondary to moisture.  She reports that she does wear pads and depends to help with extra urination and incontinence.  Additionally she also reports that she does water aerobics.  And she is not very active and sedentary.  She has a weakened area in the buttocks that has created a moisture associated wound.  We will provide her with secure heart spot cream and nystatin powder.  Advised her on how to appropriately apply these.  Additionally advised her to keep her self cleansed regularly.  And not to sit in anything that is wet for next ended amount of time.  Advised that she can get a doughnut pillow if she wants to help prevent pressure from being added to that spot.   Reviewed side effects, risks and benefits of medication.   Patient acknowledged agreement and  understanding of the plan.    - Hydrocortisone (GERHARDT'S BUTT CREAM) CREA; Apply 1 application topically 3 (three) times daily.  Dispense: 1 each; Refill: 1 - nystatin (MYCOSTATIN/NYSTOP) powder; Apply topically 4 (four) times daily.  Dispense: 15 g; Refill: 0  3. Open wound of skin As stated above she has a wound that is probably secondary to pressure/moisture.  Advised on use of the cream and powder in addition to getting a donut pillow to help to prevent her pressure.  Encouraged her to walk as she can since  she is not to do aerobics for at least a week to help this form a scab over the site.  She currently has no redness no drainage, no masses or bumps of the skin.  It is flat and newly established.  Advised her on signs and symptoms to look for should infection occur.  As this is rather close to the anal area and on the inner side of the buttocks.  Currently encouraged not to add a dressing to prevent infection from bacteria harboring.   Reviewed side effects, risks and benefits of medication.   Patient acknowledged agreement and understanding of the plan.    - Hydrocortisone (GERHARDT'S BUTT CREAM) CREA; Apply 1 application topically 3 (three) times daily.  Dispense: 1 each; Refill: 1 - nystatin (MYCOSTATIN/NYSTOP) powder; Apply topically 4 (four) times daily.  Dispense: 15 g; Refill: 0    Follow Ups: 04/21/2019  Freddy FinnerHannah M. Chanay Nugent, DNP, AGNP-BC [facility] [address], [address] [place], [place] [address] Office Hours: Mon-Thurs 8 am-5 pm; Fri 8 am-12 pm Office Phone:  [phone]  Office Fax: [phone]

## 2019-03-31 ENCOUNTER — Telehealth: Payer: Self-pay | Admitting: *Deleted

## 2019-03-31 NOTE — Telephone Encounter (Signed)
Pt LVM stating she had saw Jarrett Soho yesterday 9-8 and Jarrett Soho was going to call in some powder and cream for her. She had the powder but did not have the cream and she was going to wait to use the powder when she had the cream. Just wanted that sent in so she could start using it.

## 2019-03-31 NOTE — Telephone Encounter (Signed)
Notified patient that prescription has been faxed a second time.

## 2019-04-05 DIAGNOSIS — J984 Other disorders of lung: Secondary | ICD-10-CM | POA: Diagnosis not present

## 2019-04-14 DIAGNOSIS — I10 Essential (primary) hypertension: Secondary | ICD-10-CM | POA: Diagnosis not present

## 2019-04-14 DIAGNOSIS — E785 Hyperlipidemia, unspecified: Secondary | ICD-10-CM | POA: Diagnosis not present

## 2019-04-14 DIAGNOSIS — E559 Vitamin D deficiency, unspecified: Secondary | ICD-10-CM | POA: Diagnosis not present

## 2019-04-14 LAB — CBC
HCT: 40 % (ref 35.0–45.0)
Hemoglobin: 13.2 g/dL (ref 11.7–15.5)
MCH: 29.6 pg (ref 27.0–33.0)
MCHC: 33 g/dL (ref 32.0–36.0)
MCV: 89.7 fL (ref 80.0–100.0)
MPV: 13 fL — ABNORMAL HIGH (ref 7.5–12.5)
Platelets: 197 10*3/uL (ref 140–400)
RBC: 4.46 10*6/uL (ref 3.80–5.10)
RDW: 12.7 % (ref 11.0–15.0)
WBC: 8.1 10*3/uL (ref 3.8–10.8)

## 2019-04-14 LAB — COMPLETE METABOLIC PANEL WITH GFR
AG Ratio: 1.3 (calc) (ref 1.0–2.5)
ALT: 10 U/L (ref 6–29)
AST: 16 U/L (ref 10–35)
Albumin: 3.7 g/dL (ref 3.6–5.1)
Alkaline phosphatase (APISO): 61 U/L (ref 37–153)
BUN: 10 mg/dL (ref 7–25)
CO2: 32 mmol/L (ref 20–32)
Calcium: 8.8 mg/dL (ref 8.6–10.4)
Chloride: 105 mmol/L (ref 98–110)
Creat: 0.65 mg/dL (ref 0.60–0.93)
GFR, Est African American: 104 mL/min/{1.73_m2} (ref >=60)
GFR, Est Non African American: 89 mL/min/{1.73_m2} (ref >=60)
Globulin: 2.9 g/dL (calc) (ref 1.9–3.7)
Glucose, Bld: 115 mg/dL — ABNORMAL HIGH (ref 65–99)
Potassium: 3.3 mmol/L — ABNORMAL LOW (ref 3.5–5.3)
Sodium: 144 mmol/L (ref 135–146)
Total Bilirubin: 0.9 mg/dL (ref 0.2–1.2)
Total Protein: 6.6 g/dL (ref 6.1–8.1)

## 2019-04-14 LAB — LIPID PANEL
Cholesterol: 220 mg/dL — ABNORMAL HIGH (ref <200)
HDL: 66 mg/dL (ref >=50)
LDL Cholesterol (Calc): 138 mg/dL (calc) — ABNORMAL HIGH
Non-HDL Cholesterol (Calc): 154 mg/dL (calc) — ABNORMAL HIGH (ref <130)
Total CHOL/HDL Ratio: 3.3 (calc) (ref <5.0)
Triglycerides: 67 mg/dL (ref <150)

## 2019-04-14 LAB — TSH: TSH: 2.12 mIU/L (ref 0.40–4.50)

## 2019-04-14 LAB — VITAMIN D 25 HYDROXY (VIT D DEFICIENCY, FRACTURES): Vit D, 25-Hydroxy: 23 ng/mL — ABNORMAL LOW (ref 30–100)

## 2019-04-21 ENCOUNTER — Ambulatory Visit (INDEPENDENT_AMBULATORY_CARE_PROVIDER_SITE_OTHER): Payer: Medicare Other | Admitting: Family Medicine

## 2019-04-21 ENCOUNTER — Other Ambulatory Visit: Payer: Self-pay

## 2019-04-21 ENCOUNTER — Encounter: Payer: Self-pay | Admitting: Family Medicine

## 2019-04-21 ENCOUNTER — Encounter (INDEPENDENT_AMBULATORY_CARE_PROVIDER_SITE_OTHER): Payer: Self-pay

## 2019-04-21 VITALS — BP 160/82 | HR 79 | Temp 98.3°F | Ht 60.0 in | Wt 203.0 lb

## 2019-04-21 DIAGNOSIS — E785 Hyperlipidemia, unspecified: Secondary | ICD-10-CM

## 2019-04-21 DIAGNOSIS — I10 Essential (primary) hypertension: Secondary | ICD-10-CM | POA: Diagnosis not present

## 2019-04-21 DIAGNOSIS — F419 Anxiety disorder, unspecified: Secondary | ICD-10-CM | POA: Diagnosis not present

## 2019-04-21 DIAGNOSIS — F329 Major depressive disorder, single episode, unspecified: Secondary | ICD-10-CM

## 2019-04-21 DIAGNOSIS — Z23 Encounter for immunization: Secondary | ICD-10-CM

## 2019-04-21 DIAGNOSIS — F32A Depression, unspecified: Secondary | ICD-10-CM

## 2019-04-21 MED ORDER — SERTRALINE HCL 25 MG PO TABS: 25.0000 mg | ORAL_TABLET | Freq: Every day | ORAL | 1 refills | Status: DC

## 2019-04-21 MED ORDER — AMLODIPINE BESYLATE 10 MG PO TABS: 10.0000 mg | ORAL_TABLET | Freq: Every day | ORAL | 3 refills | Status: DC

## 2019-04-21 NOTE — Patient Instructions (Signed)
F/U in office with MD first week in November, BP re eval, call if you need me sooner  Flu vaccine today  BP meds are Losartan, amlodipine 10 mg and metoprolol 50 mg each is one daily  Now you have in office amlodipine 5 mg if this is all you have until you get the 10 mg  Tablet take two

## 2019-04-21 NOTE — Assessment & Plan Note (Signed)
Improved reduce sertraline dose to 25 mg

## 2019-04-21 NOTE — Assessment & Plan Note (Signed)
Hyperlipidemia:Low fat diet discussed and encouraged.   Lipid Panel  Lab Results  Component Value Date   CHOL 220 (H) 04/14/2019   HDL 66 04/14/2019   LDLCALC 138 (H) 04/14/2019   TRIG 67 04/14/2019   CHOLHDL 3.3 04/14/2019   Not at goal , needs top reduce fat in diet Updated lab needed at/ before next visit.

## 2019-04-21 NOTE — Assessment & Plan Note (Signed)
Uncontrolled, pt non compliant as she is  confused about meds, which are sorted out at visit Return for re eval in 5 weeks DASH diet and commitment to daily physical activity for a minimum of 30 minutes discussed and encouraged, as a part of hypertension management. The importance of attaining a healthy weight is also discussed.  BP/Weight 04/21/2019 03/30/2019 03/19/2019 01/12/2019 10/26/2018 11/22/2704 08/24/7626  Systolic BP 315 176 160 737 106 269 485  Diastolic BP 82 80 92 92 86 96 84  Wt. (Lbs) 203 201 205 205 207 198 200  BMI 39.65 39.26 40.04 40.04 40.43 38.67 39.06

## 2019-04-21 NOTE — Progress Notes (Signed)
[name]     MRN: [id number]      DOB: 1945-12-29   HPI [name] is here for follow up and re-evaluation of chronic medical conditions,in particular hypertension , she has her medication bottles with her, and unfortunately, hyzaar is tri plicated with medication over 12 months, also amlodipine dose she has been taking is not current. The PT denies any adverse reactions to current medications since the last visit.  Due to take hermeds at time of visit, and she does thois after her current meds are sorted out fully   ROS Denies recent fever or chills. Denies sinus pressure, nasal congestion, ear pain or sore throat. Denies chest congestion, productive cough or wheezing. Denies chest pains, palpitations and leg swelling Denies abdominal pain, nausea, vomiting,diarrhea or constipation.   Denies dysuria, frequency, hesitancy or incontinence. Denies uncontrolled joint pain, swelling and limitation in mobility. Denies headaches, seizures, numbness, or tingling. Denies depression, anxiety or insomnia. Denies skin break down or rash.   PE  BP (!) 160/82   Pulse 79   Temp 98.3 F (36.8 C) (Temporal)   Ht 5' (1.524 m)   Wt 203 lb (92.1 kg)   SpO2 95%   BMI 39.65 kg/m   Patient alert and oriented and in no cardiopulmonary distress.  HEENT: No facial asymmetry, EOMI,    Chest: Clear to auscultation bilaterally.  CVS: S1, S2 no murmurs, no S3.Regular rate.  ABD: Soft non tender.   Ext: No edema  MS: Adequate though reduced ROM spine, shoulders, hips and knees.  Skin: Intact, no ulcerations or rash noted.  Psych: Good eye contact, normal affect. Memory intact not anxious or depressed appearing.  CNS: CN 2-12 intact, power,  normal throughout.no focal deficits noted.   Assessment & Plan  Essential hypertension Uncontrolled, pt non compliant as she is  confused about meds, which are sorted out at visit Return for re eval in 5 weeks DASH diet and commitment to  daily physical activity for a minimum of 30 minutes discussed and encouraged, as a part of hypertension management. The importance of attaining a healthy weight is also discussed.  BP/Weight 04/21/2019 03/30/2019 03/19/2019 01/12/2019 10/26/2018 08/28/2018 07/27/2018  Systolic BP 160 160 142 142 170 215 164  Diastolic BP 82 80 92 92 86 96 84  Wt. (Lbs) 203 201 205 205 207 198 200  BMI 39.65 39.26 40.04 40.04 40.43 38.67 39.06       Anxiety and depression Improved reduce sertraline dose to 25 mg   Hyperlipidemia LDL goal <100 Hyperlipidemia:Low fat diet discussed and encouraged.   Lipid Panel  Lab Results  Component Value Date   CHOL 220 (H) 04/14/2019   HDL 66 04/14/2019   LDLCALC 138 (H) 04/14/2019   TRIG 67 04/14/2019   CHOLHDL 3.3 04/14/2019   Not at goal , needs top reduce fat in diet Updated lab needed at/ before next visit.     OSTEOARTHRITIS, KNEES, BILATERAL No current or recent flare of uncontrolled pain and no recent fall/ near fall, weight loss encouraged  Morbid obesity (HCC) Obesity associated with ypertension and arthritis  Patient re-educated about  the importance of commitment to a  minimum of 150 minutes of exercise per week as able.  The importance of healthy food choices with portion control discussed, as well as eating regularly and within a 12 hour window most days. The need to choose "clean , green" food 50 to 75% of the time is discussed, as well as to  make water the primary drink and set a goal of 64 ounces water daily.    Weight /BMI 04/21/2019 03/30/2019 03/19/2019  WEIGHT 203 lb 201 lb 205 lb  HEIGHT 5\' 0"  5\' 0"  5\' 0"   BMI 39.65 kg/m2 39.26 kg/m2 40.04 kg/m2

## 2019-04-21 NOTE — Assessment & Plan Note (Signed)
Obesity associated with ypertension and arthritis  Patient re-educated about  the importance of commitment to a  minimum of 150 minutes of exercise per week as able.  The importance of healthy food choices with portion control discussed, as well as eating regularly and within a 12 hour window most days. The need to choose "clean , green" food 50 to 75% of the time is discussed, as well as to make water the primary drink and set a goal of 64 ounces water daily.    Weight /BMI 04/21/2019 03/30/2019 03/19/2019  WEIGHT 203 lb 201 lb 205 lb  HEIGHT 5\' 0"  5\' 0"  5\' 0"   BMI 39.65 kg/m2 39.26 kg/m2 40.04 kg/m2

## 2019-04-21 NOTE — Assessment & Plan Note (Signed)
No current or recent flare of uncontrolled pain and no recent fall/ near fall, weight loss encouraged

## 2019-04-23 ENCOUNTER — Other Ambulatory Visit: Payer: Self-pay | Admitting: Family Medicine

## 2019-04-29 ENCOUNTER — Ambulatory Visit (HOSPITAL_COMMUNITY)
Admission: RE | Admit: 2019-04-29 | Discharge: 2019-04-29 | Disposition: A | Discharge status: Home / Self Care | Payer: Medicare Other | Source: Ambulatory Visit | Attending: Family Medicine | Admitting: Family Medicine

## 2019-04-29 ENCOUNTER — Other Ambulatory Visit: Payer: Self-pay

## 2019-04-29 DIAGNOSIS — Z1231 Encounter for screening mammogram for malignant neoplasm of breast: Secondary | ICD-10-CM

## 2019-05-05 DIAGNOSIS — J984 Other disorders of lung: Secondary | ICD-10-CM | POA: Diagnosis not present

## 2019-05-28 ENCOUNTER — Other Ambulatory Visit: Payer: Self-pay | Admitting: Family Medicine

## 2019-06-05 DIAGNOSIS — J984 Other disorders of lung: Secondary | ICD-10-CM | POA: Diagnosis not present

## 2019-06-09 ENCOUNTER — Ambulatory Visit (INDEPENDENT_AMBULATORY_CARE_PROVIDER_SITE_OTHER): Payer: Medicare Other | Admitting: Family Medicine

## 2019-06-09 ENCOUNTER — Other Ambulatory Visit: Payer: Self-pay

## 2019-06-09 ENCOUNTER — Encounter: Payer: Self-pay | Admitting: Family Medicine

## 2019-06-09 VITALS — BP 160/82 | Ht 60.0 in | Wt 203.0 lb

## 2019-06-09 DIAGNOSIS — F329 Major depressive disorder, single episode, unspecified: Secondary | ICD-10-CM

## 2019-06-09 DIAGNOSIS — F419 Anxiety disorder, unspecified: Secondary | ICD-10-CM

## 2019-06-09 DIAGNOSIS — R7301 Impaired fasting glucose: Secondary | ICD-10-CM

## 2019-06-09 DIAGNOSIS — I10 Essential (primary) hypertension: Secondary | ICD-10-CM

## 2019-06-09 DIAGNOSIS — M65352 Trigger finger, left little finger: Secondary | ICD-10-CM | POA: Insufficient documentation

## 2019-06-09 DIAGNOSIS — I272 Pulmonary hypertension, unspecified: Secondary | ICD-10-CM

## 2019-06-09 DIAGNOSIS — F32A Depression, unspecified: Secondary | ICD-10-CM

## 2019-06-09 DIAGNOSIS — E785 Hyperlipidemia, unspecified: Secondary | ICD-10-CM | POA: Diagnosis not present

## 2019-06-09 IMAGING — DX DG KNEE COMPLETE 4+V*R*
4 series · 4 of 4 positions shown · non-contrast
Comparison: RIGHT knee radiograph December 03, 2003.

CLINICAL DATA: Trip and fall in parking lot.

EXAM:
RIGHT KNEE - COMPLETE 4+ VIEW

[knee ap]
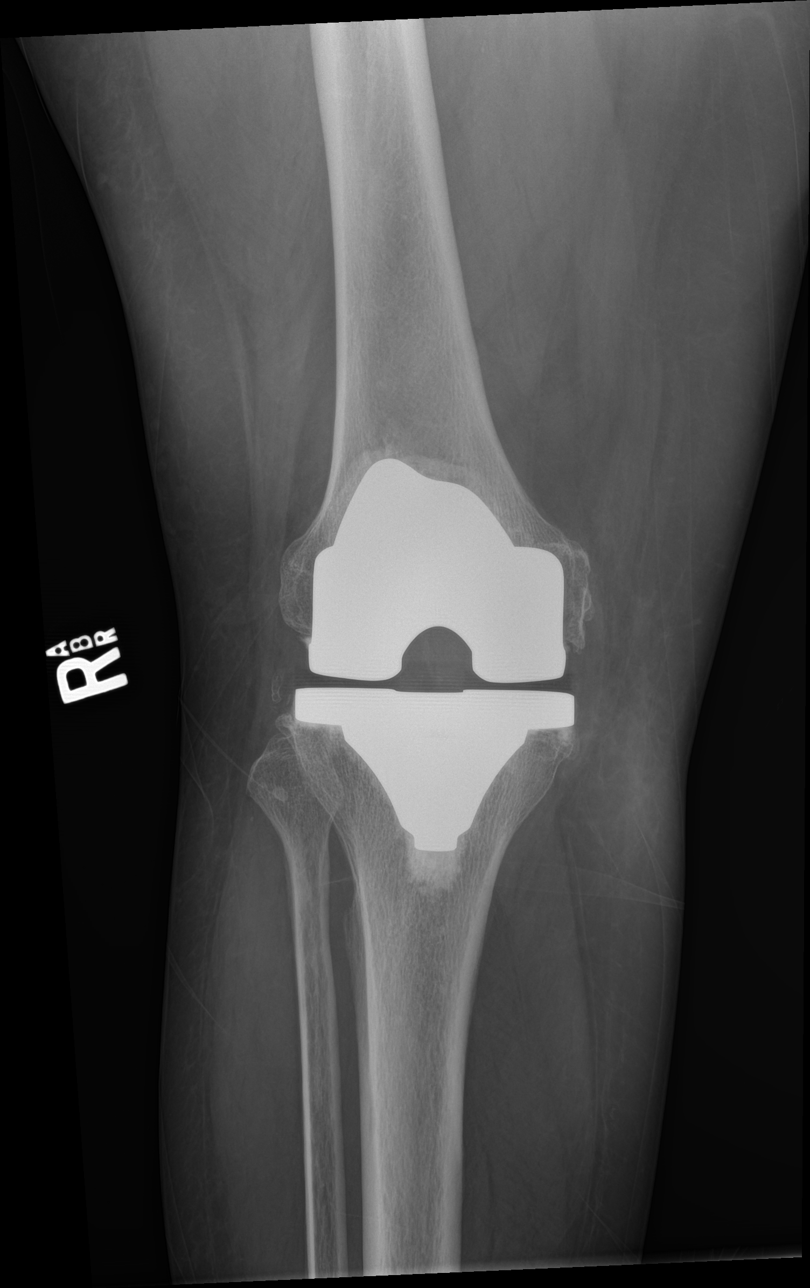

[knee obl (1 of 2)]
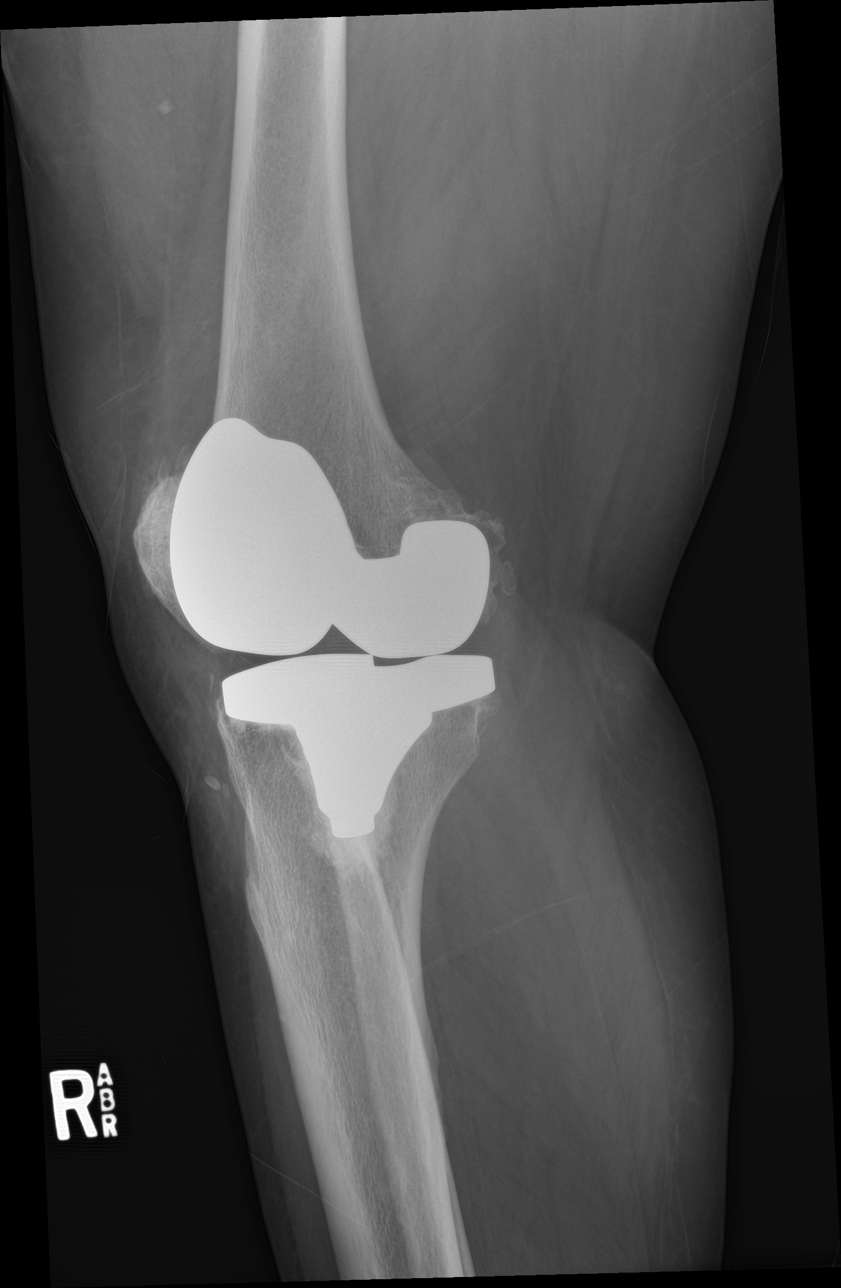

[knee obl (2 of 2)]
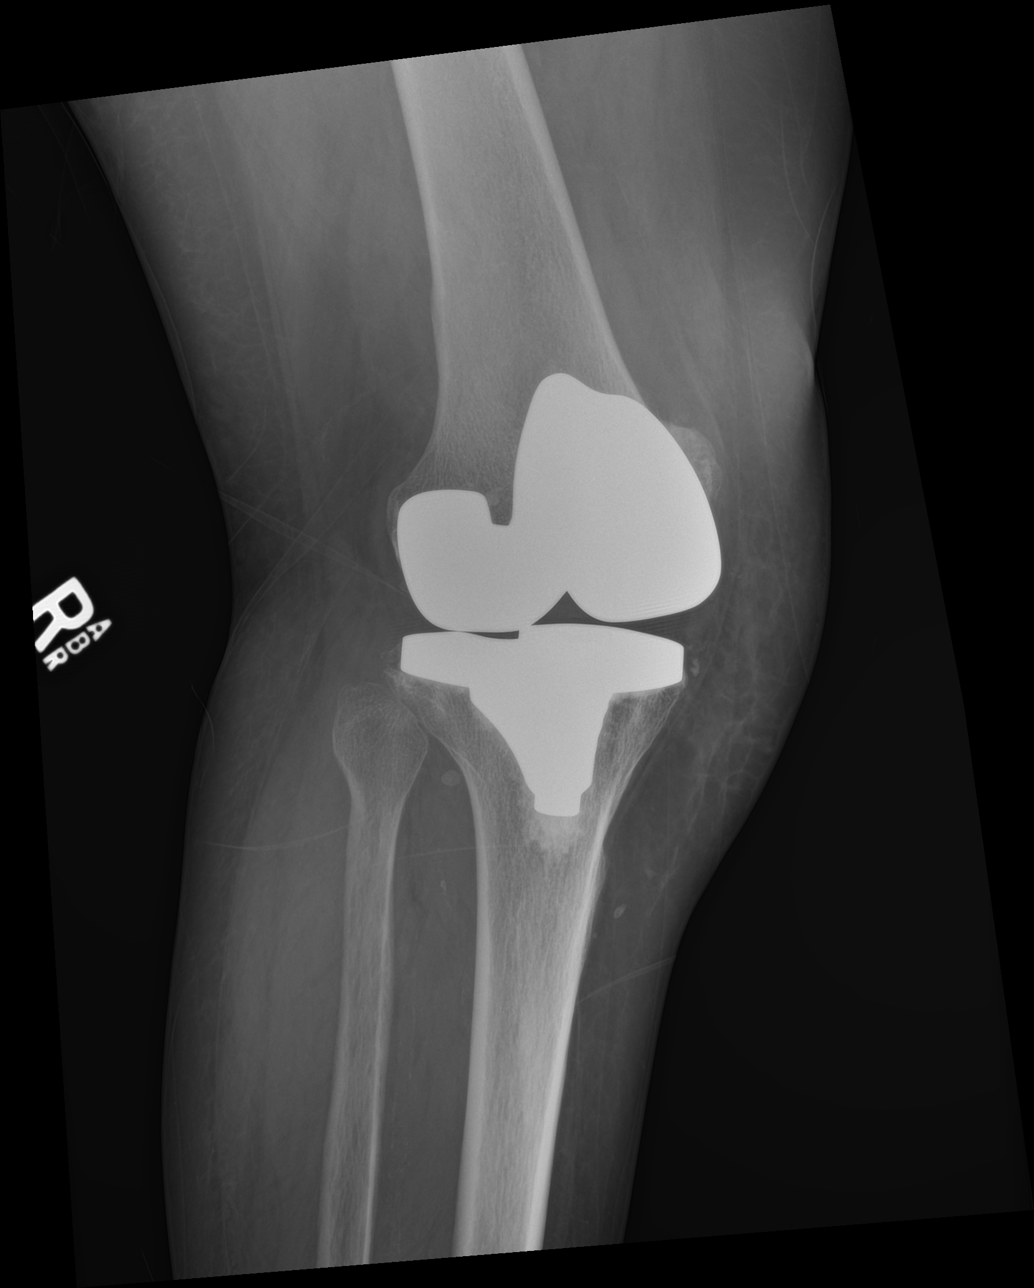

[knee lat]
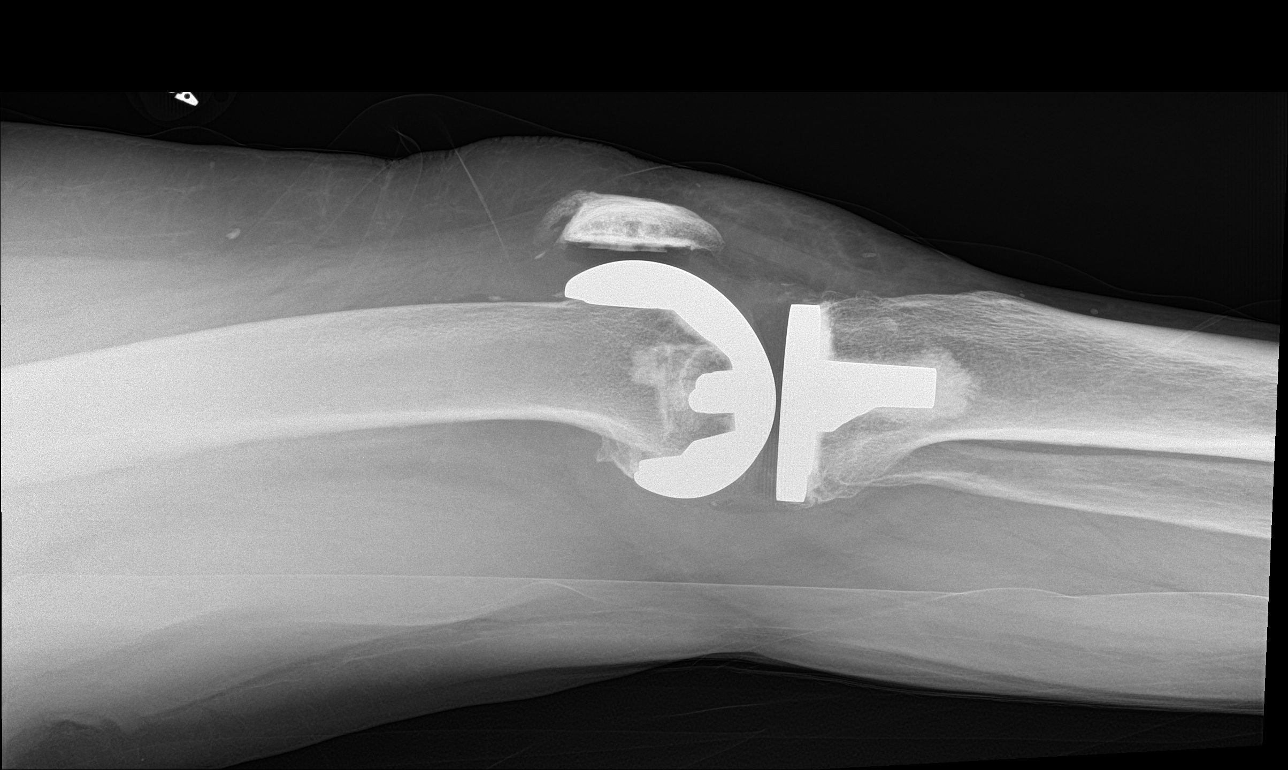

[4 of 4 positions shown; findings below may reference images not displayed]

FINDINGS: Status post total knee arthroplasty with intact well seated femoral
and tibial components, expected appearance of the resurfaced
patella. No acute fracture deformity. No dislocation. No destructive
bony lesions. Soft tissue planes are nonacute.
IMPRESSION: 1. No fracture deformity or dislocation.
2. Status post total knee arthroplasty without radiographic findings
of hardware failure.

## 2019-06-09 IMAGING — DX DG RIBS W/ CHEST 3+V BILAT
7 series · 7 of 7 positions shown · non-contrast
Comparison: Chest radiograph July 23, 2018

CLINICAL DATA: Fell in parking lot.  History of asthma.

EXAM:
BILATERAL RIBS AND CHEST - 4+ VIEW

[chest pa]
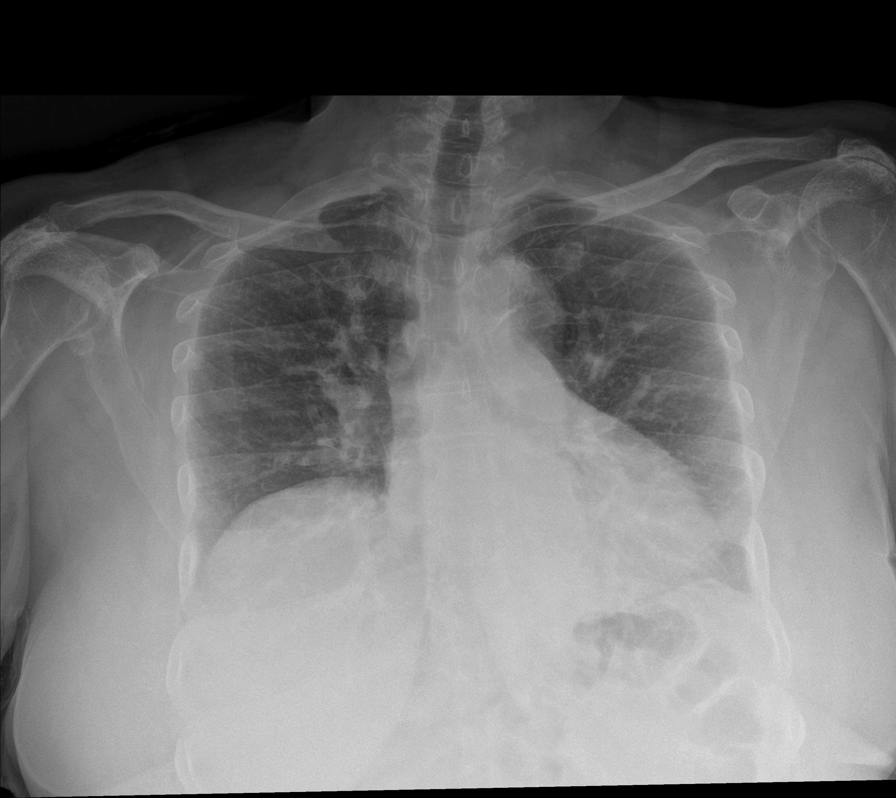

[rib pa obl (1 of 2)]
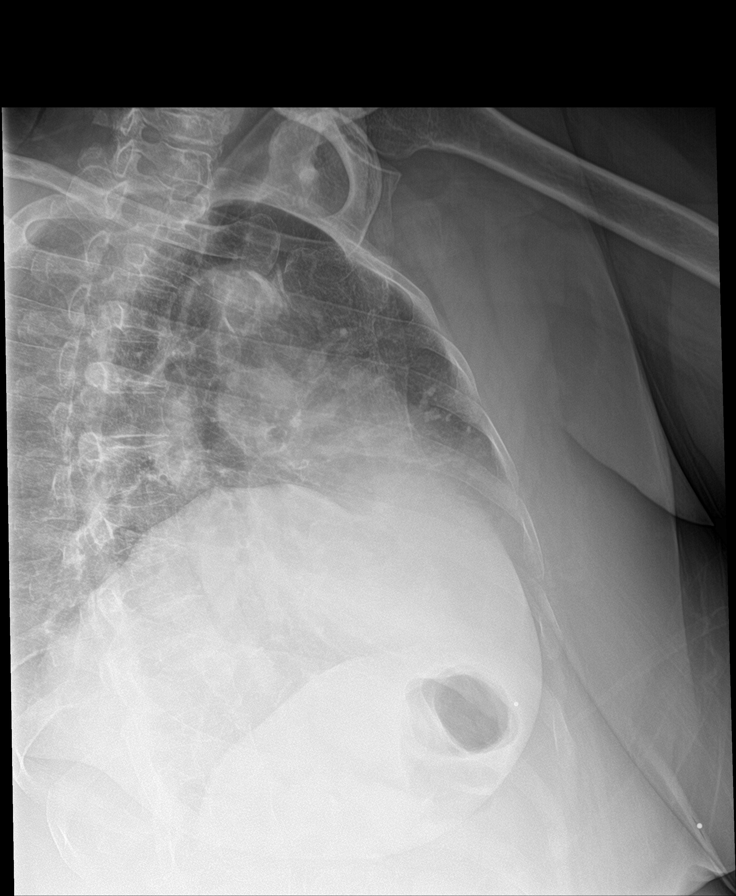

[rib pa obl (2 of 2)]
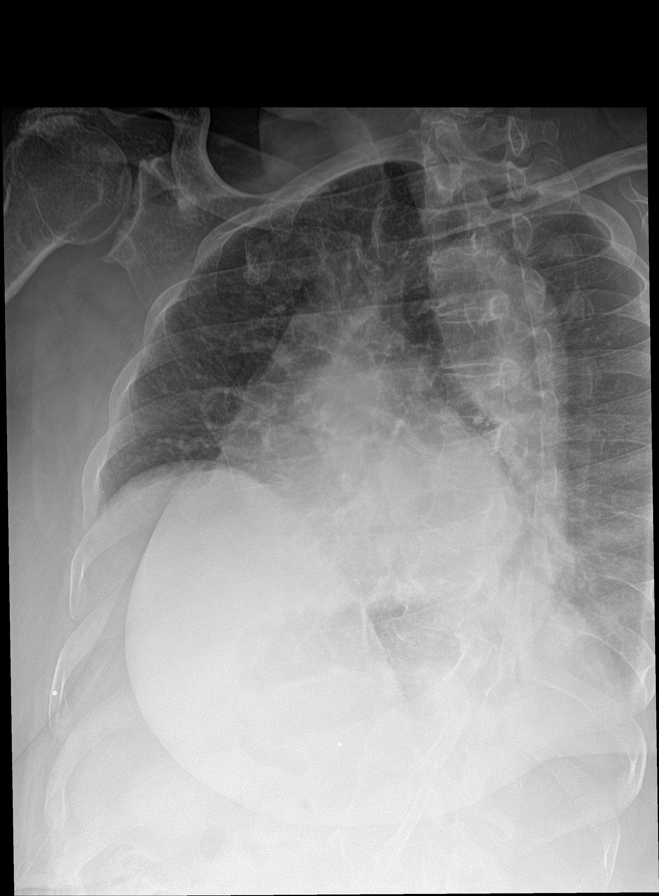

[rib ap (1 of 4)]
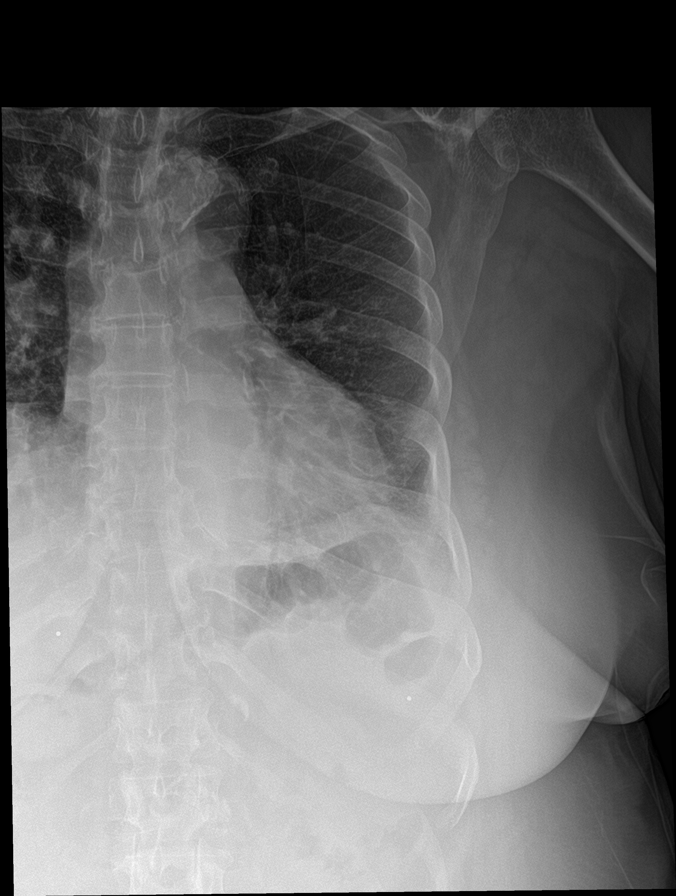

[rib ap (2 of 4)]
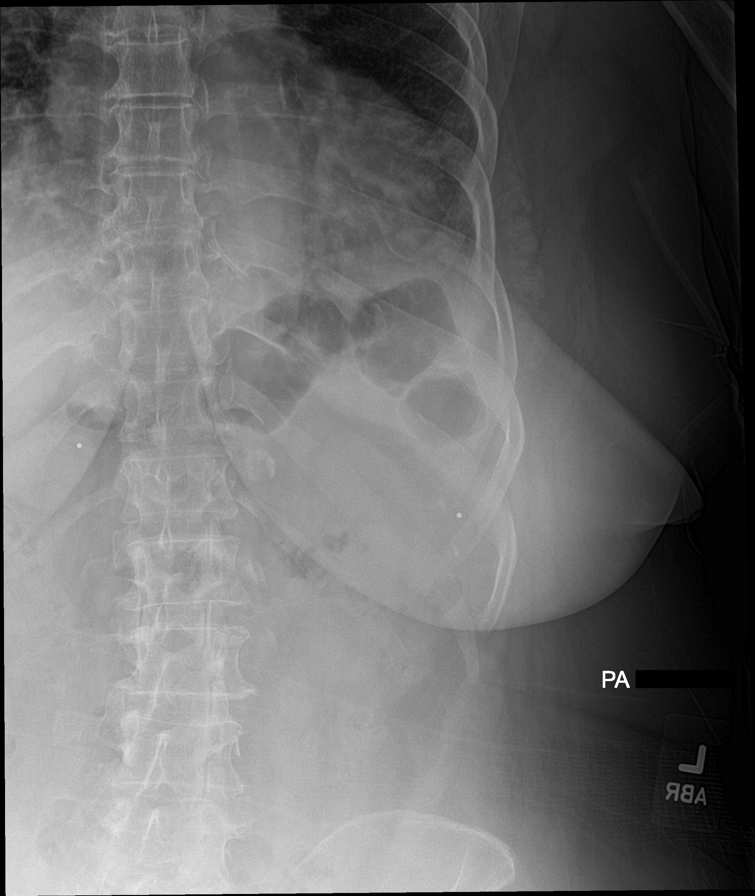

[rib ap (3 of 4)]
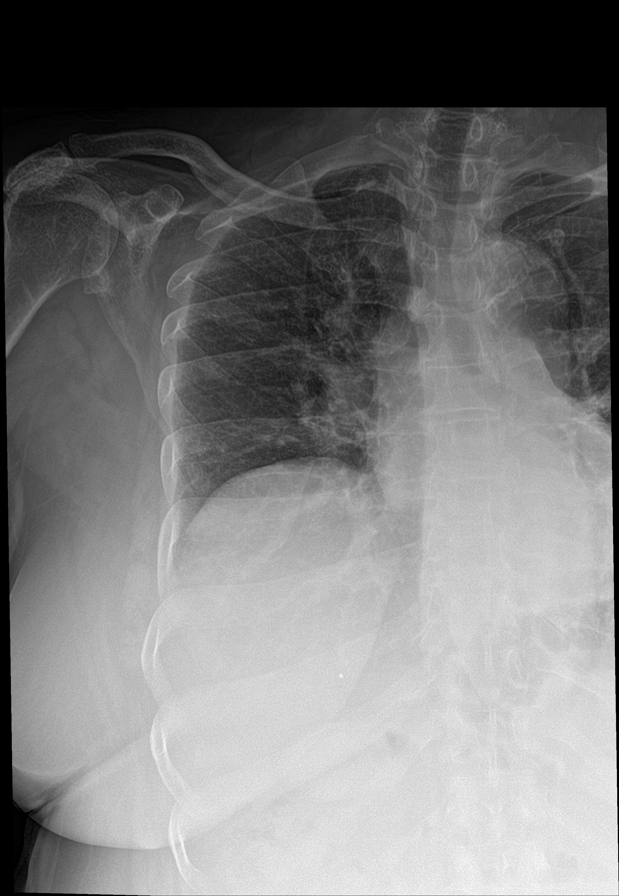

[rib ap (4 of 4)]
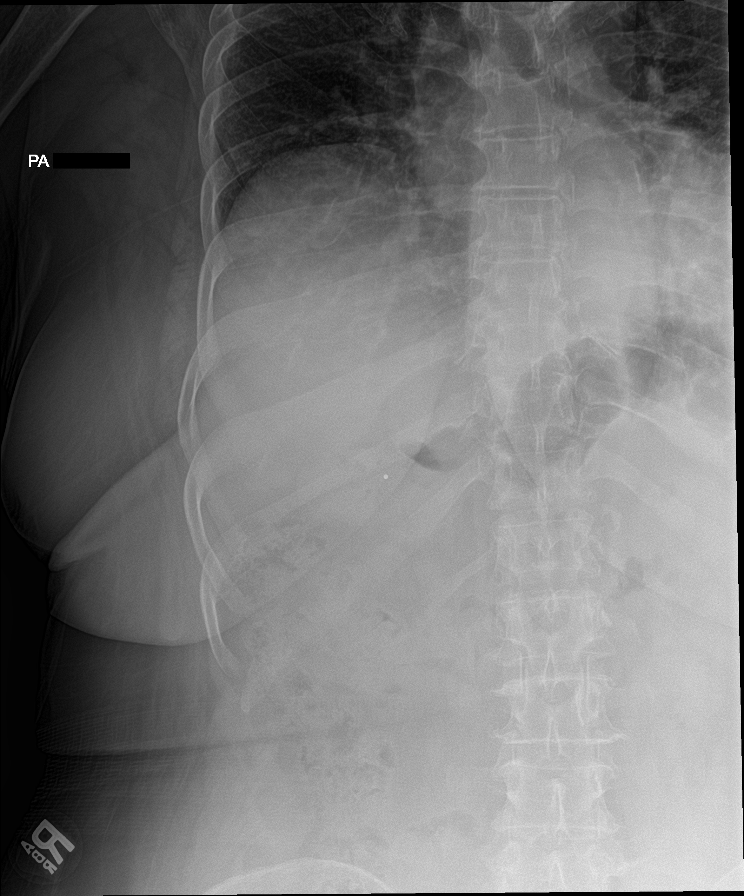

[7 of 7 positions shown; findings below may reference images not displayed]

FINDINGS: Cardiomediastinal silhouette is unremarkable for this low
inspiratory examination with crowded vasculature markings. Calcified
aortic arch. The lungs are clear without pleural effusions or focal
consolidations. Confluence of osseous shadows LEFT upper lung. Mild
chronic bronchitic changes. Similarly elevated RIGHT hemidiaphragm.
Trachea projects midline and there is no pneumothorax. Included soft
tissue planes and osseous structures are non-suspicious. High-riding
humeral heads seen with old rotator cuff injuries. No rib fracture
deformity.
IMPRESSION: 1. Mild similar chronic bronchitic changes.
2. No rib fracture deformity.

## 2019-06-09 MED ORDER — LOSARTAN POTASSIUM-HCTZ 100-12.5 MG PO TABS: 1.0000 | ORAL_TABLET | Freq: Every day | ORAL | 3 refills | Status: DC

## 2019-06-09 NOTE — Assessment & Plan Note (Signed)
Updated lab needed at/ before next visit. Hyperlipidemia:Low fat diet discussed and encouraged.   Lipid Panel  Lab Results  Component Value Date   CHOL 220 (H) 04/14/2019   HDL 66 04/14/2019   LDLCALC 138 (H) 04/14/2019   TRIG 67 04/14/2019   CHOLHDL 3.3 04/14/2019

## 2019-06-09 NOTE — Progress Notes (Signed)
Virtual Visit via Telephone Note  I connected with [name] on 06/09/19 at  9:20 AM EST by telephone and verified that I am speaking with the correct person using two identifiers.  Location: Patient: home  Provider: office   I discussed the limitations, risks, security and privacy concerns of performing an evaluation and management service by telephone and the availability of in person appointments. I also discussed with the patient that there may be a patient responsible charge related to this service. The patient expressed understanding and agreed to proceed.   History of Present Illness: 4 week h/o pain and triggering of left pinky finger x 4 weeks , first episode, at distal IP Lost her 71 y/o mom last week , so trying to get over this. Needs in office bP re eval Needs to check pulmonary for medication for pulmonary hypertension Denies recent fever or chills. Denies sinus pressure, nasal congestion, ear pain or sore throat. Denies chest congestion, productive cough or wheezing. Denies chest pains, palpitations and leg swelling Denies abdominal pain, nausea, vomiting,diarrhea or constipation.   Denies dysuria, frequency, hesitancy or incontinence. . Denies headaches, seizures, numbness, or tingling. Denies uncontrolled depression, anxiety or insomnia. Denies skin break down or rash.       Observations/Objective:  BP (!) 160/82   Ht 5' (1.524 m)   Wt 203 lb (92.1 kg)   BMI 39.65 kg/m   Good communication with no confusion and intact memory. Alert and oriented x 3 No signs of respiratory distress during speech   Assessment and Plan:  Essential hypertension Needs in office  Reassessment, will arrange nurse bP check next Monday at 1 pm, current antihypertensive medsa and doses have been verified DASH diet and commitment to daily physical activity for a minimum of 30 minutes discussed and encouraged, as a part of hypertension management. The importance of attaining  a healthy weight is also discussed.  BP/Weight 06/09/2019 04/21/2019 03/30/2019 03/19/2019 01/12/2019 12/26/3417 09/26/9022  Systolic BP 097 353 299 242 683 419 622  Diastolic BP 82 82 80 92 92 86 96  Wt. (Lbs) 203 203 201 205 205 207 198  BMI 39.65 39.65 39.26 40.04 40.04 40.43 38.67       Anxiety and depression Controlled, no change in medication   Hyperlipidemia LDL goal <100 Updated lab needed at/ before next visit. Hyperlipidemia:Low fat diet discussed and encouraged.   Lipid Panel  Lab Results  Component Value Date   CHOL 220 (H) 04/14/2019   HDL 66 04/14/2019   LDLCALC 138 (H) 04/14/2019   TRIG 67 04/14/2019   CHOLHDL 3.3 04/14/2019       Morbid obesity ([place]) Obesity linked with hypertension and depression and arthritis  Patient re-educated about  the importance of commitment to a  minimum of 150 minutes of exercise per week as able.  The importance of healthy food choices with portion control discussed, as well as eating regularly and within a 12 hour window most days. The need to choose "clean , green" food 50 to 75% of the time is discussed, as well as to make water the primary drink and set a goal of 64 ounces water daily.    Weight /BMI 06/09/2019 04/21/2019 03/30/2019  WEIGHT 203 lb 203 lb 201 lb  HEIGHT 5\' 0"  5\' 0"  5\' 0"   BMI 39.65 kg/m2 39.65 kg/m2 39.26 kg/m2      Pulmonary hypertension Advised pt to contact pulmonary for refills  Trigger finger, left little finger 4 week history, refer Ortho  Follow Up Instructions:    I discussed the assessment and treatment plan with the patient. The patient was provided an opportunity to ask questions and all were answered. The patient agreed with the plan and demonstrated an understanding of the instructions.   The patient was advised to call back or seek an in-person evaluation if the symptoms worsen or if the condition fails to improve as anticipated.  I provided 25 minutes of non-face-to-face time during  this encounter.   Syliva Overman, MD

## 2019-06-09 NOTE — Assessment & Plan Note (Signed)
Obesity linked with hypertension and depression and arthritis  Patient re-educated about  the importance of commitment to a  minimum of 150 minutes of exercise per week as able.  The importance of healthy food choices with portion control discussed, as well as eating regularly and within a 12 hour window most days. The need to choose "clean , green" food 50 to 75% of the time is discussed, as well as to make water the primary drink and set a goal of 64 ounces water daily.    Weight /BMI 06/09/2019 04/21/2019 03/30/2019  WEIGHT 203 lb 203 lb 201 lb  HEIGHT 5\' 0"  5\' 0"  5\' 0"   BMI 39.65 kg/m2 39.65 kg/m2 39.26 kg/m2

## 2019-06-09 NOTE — Patient Instructions (Addendum)
Nurse visit at 1 pm on 06/14/2019 for BP eval, this to be linked to this visit , as the specific reason for this one is uncontrolled bP and med was adjusted  F/U with MD in 4 months, call if you need me sooner  Please keep envelope with f/u appt , discharge summary and lab order in office for pt to collect when she comes next week  You are referred to Dr Truman Hayward re trigger finger  Condolence on your Mom's passing  Fasting lipid, cmp and eGFr, hBa1C 1 week before appt with MD in 2021  Thanks for choosing [facility], we consider it a privelige to serve you.

## 2019-06-09 NOTE — Assessment & Plan Note (Signed)
Controlled, no change in medication  

## 2019-06-09 NOTE — Assessment & Plan Note (Signed)
Advised pt to contact pulmonary for refills

## 2019-06-09 NOTE — Assessment & Plan Note (Signed)
Needs in office  Reassessment, will arrange nurse bP check next Monday at 1 pm, current antihypertensive medsa and doses have been verified DASH diet and commitment to daily physical activity for a minimum of 30 minutes discussed and encouraged, as a part of hypertension management. The importance of attaining a healthy weight is also discussed.  BP/Weight 06/09/2019 04/21/2019 03/30/2019 03/19/2019 01/12/2019 09/22/6217 10/27/1250  Systolic BP 712 929 090 [phone] 493  Diastolic BP 82 82 80 92 92 86 96  Wt. (Lbs) 203 203 201 205 205 207 198  BMI 39.65 39.65 39.26 40.04 40.04 40.43 38.67

## 2019-06-09 NOTE — Assessment & Plan Note (Signed)
4 week history, refer Ortho

## 2019-06-13 ENCOUNTER — Other Ambulatory Visit: Payer: Self-pay | Admitting: Family Medicine

## 2019-06-14 ENCOUNTER — Other Ambulatory Visit: Payer: Self-pay

## 2019-06-14 ENCOUNTER — Ambulatory Visit: Payer: Medicare Other

## 2019-06-14 VITALS — BP 124/80

## 2019-06-14 DIAGNOSIS — I10 Essential (primary) hypertension: Secondary | ICD-10-CM

## 2019-06-14 NOTE — Progress Notes (Signed)
Blood pressure at goal on medicatin, no change

## 2019-06-30 ENCOUNTER — Encounter: Payer: Self-pay | Admitting: Orthopedic Surgery

## 2019-06-30 ENCOUNTER — Ambulatory Visit: Payer: Medicare Other | Admitting: Orthopedic Surgery

## 2019-06-30 ENCOUNTER — Other Ambulatory Visit: Payer: Self-pay

## 2019-06-30 VITALS — BP 173/80 | HR 88 | Ht 60.0 in | Wt 197.0 lb

## 2019-06-30 DIAGNOSIS — M65352 Trigger finger, left little finger: Secondary | ICD-10-CM

## 2019-06-30 DIAGNOSIS — M6530 Trigger finger, unspecified finger: Secondary | ICD-10-CM

## 2019-06-30 NOTE — Patient Instructions (Signed)
Trigger Finger  Trigger finger (stenosing tenosynovitis) is a condition that causes a finger to get stuck in a bent position. Each finger has a tough, cord-like tissue that connects muscle to bone (tendon), and each tendon is surrounded by a tunnel of tissue (tendon sheath). To move your finger, your tendon needs to slide freely through the sheath. Trigger finger happens when the tendon or the sheath thickens, making it difficult to move your finger. Trigger finger can affect any finger or a thumb. It may affect more than one finger. Mild cases may clear up with rest and medicine. Severe cases require more treatment. What are the causes? Trigger finger is caused by a thickened finger tendon or tendon sheath. The cause of this thickening is not known. What increases the risk? The following factors may make you more likely to develop this condition:  Doing activities that require a strong grip.  Having rheumatoid arthritis, gout, or diabetes.  Being 40-60 years old.  Being a woman. What are the signs or symptoms? Symptoms of this condition include:  Pain when bending or straightening your finger.  Tenderness or swelling where your finger attaches to the palm of your hand.  A lump in the palm of your hand or on the inside of your finger.  Hearing a popping sound when you try to straighten your finger.  Feeling a popping, catching, or locking sensation when you try to straighten your finger.  Being unable to straighten your finger. How is this diagnosed? This condition is diagnosed based on your symptoms and a physical exam. How is this treated? This condition may be treated by:  Resting your finger and avoiding activities that make symptoms worse.  Wearing a finger splint to keep your finger in a slightly bent position.  Taking NSAIDs to relieve pain and swelling.  Injecting medicine (steroids) into the tendon sheath to reduce swelling and irritation. Injections may need to be  repeated.  Having surgery to open the tendon sheath. This may be done if other treatments do not work and you cannot straighten your finger. You may need physical therapy after surgery. Follow these instructions at home:   Use moist heat to help reduce pain and swelling as told by your health care provider.  Rest your finger and avoid activities that make pain worse. Return to normal activities as told by your health care provider.  If you have a splint, wear it as told by your health care provider.  Take over-the-counter and prescription medicines only as told by your health care provider.  Keep all follow-up visits as told by your health care provider. This is important. Contact a health care provider if:  Your symptoms are not improving with home care. Summary  Trigger finger (stenosing tenosynovitis) causes your finger to get stuck in a bent position, and it can make it difficult and painful to straighten your finger.  This condition develops when a finger tendon or tendon sheath thickens.  Treatment starts with resting, wearing a splint, and taking NSAIDs.  In severe cases, surgery to open the tendon sheath may be needed. This information is not intended to replace advice given to you by your health care provider. Make sure you discuss any questions you have with your health care provider. Document Released: 04/27/2004 Document Revised: 06/20/2017 Document Reviewed: 06/18/2016 Elsevier Patient Education  2020 [employer]  

## 2019-06-30 NOTE — Progress Notes (Signed)
Chief Complaint  Patient presents with  . Hand Problem    trigger finger Left 13th     73 year old female with acquired trigger finger left small finger no trauma she is diabetic she does a lot of crocheting.  This is causing 6/10 sharp pain over the A1 pulley snapping sensation at the PIP joint. \ Review of Systems  Constitutional: Positive for malaise/fatigue.  Respiratory: Positive for shortness of breath.   Cardiovascular: Positive for leg swelling.  Musculoskeletal: Positive for joint pain.  All other systems reviewed and are negative.  Past Medical History:  Diagnosis Date  . Asthma   . Back pain   . Depression   . Diverticulosis of colon   . GERD (gastroesophageal reflux disease)   . Hyperlipidemia   . Hypertension   . Obesity   . Osteoarthrosis, unspecified whether generalized or localized, lower leg    bilateral knees   . Pulmonary hypertension ([place])    Physical Exam Vitals signs and nursing note reviewed.  Constitutional:      Appearance: Normal appearance.  Musculoskeletal:     Comments: Left small finger tenderness over the A1 pulley patient demonstrates snapping and locking.  Flexor tendon is intact no weakness there.  Neurovascular exam is intact.  All joints in the finger are stable.  Neurological:     Mental Status: She is alert and oriented to person, place, and time.  Psychiatric:        Mood and Affect: Mood normal.    Encounter Diagnosis  Name Primary?  Marland Kitchen Acquired trigger finger Yes   New problem left small finger trigger finger  Treated with injection  Trigger finger injection  Diagnosis left small finger tenosynovitis Procedure injection A1 pulley Medications lidocaine 1% 1 mL and Depo-Medrol 40 mg 1 mL Skin prep alcohol and ethyl chloride Verbal consent was obtained Timeout confirmed the injection site  After cleaning the skin with alcohol and anesthetizing the skin with ethyl chloride the A1 pulley was palpated and the injection was  performed without complication

## 2019-07-05 DIAGNOSIS — J984 Other disorders of lung: Secondary | ICD-10-CM | POA: Diagnosis not present

## 2019-08-05 DIAGNOSIS — J984 Other disorders of lung: Secondary | ICD-10-CM | POA: Diagnosis not present

## 2019-08-17 ENCOUNTER — Ambulatory Visit (HOSPITAL_COMMUNITY)
Admission: RE | Admit: 2019-08-17 | Discharge: 2019-08-17 | Disposition: A | Discharge status: Home / Self Care | Payer: Medicare Other | Source: Ambulatory Visit | Attending: Family Medicine | Admitting: Family Medicine

## 2019-08-17 ENCOUNTER — Encounter: Payer: Self-pay | Admitting: Family Medicine

## 2019-08-17 ENCOUNTER — Encounter (HOSPITAL_COMMUNITY): Payer: Self-pay

## 2019-08-17 ENCOUNTER — Ambulatory Visit (INDEPENDENT_AMBULATORY_CARE_PROVIDER_SITE_OTHER): Payer: Medicare Other | Admitting: Family Medicine

## 2019-08-17 ENCOUNTER — Other Ambulatory Visit: Payer: Self-pay

## 2019-08-17 DIAGNOSIS — R52 Pain, unspecified: Secondary | ICD-10-CM | POA: Insufficient documentation

## 2019-08-17 DIAGNOSIS — M546 Pain in thoracic spine: Secondary | ICD-10-CM | POA: Diagnosis not present

## 2019-08-17 DIAGNOSIS — R918 Other nonspecific abnormal finding of lung field: Secondary | ICD-10-CM | POA: Diagnosis not present

## 2019-08-17 DIAGNOSIS — S299XXA Unspecified injury of thorax, initial encounter: Secondary | ICD-10-CM | POA: Diagnosis not present

## 2019-08-17 NOTE — Assessment & Plan Note (Signed)
Advised to continue to use Tylenol, warm shower worse, heating pad as needed.  Small concern for possible fracture as she is osteoporotic does take Fosamax regularly.  Will be getting chest x-ray just to rule out any fractures that are unseen.  Pending findings we will treat as appropriate.
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
## 2019-08-31 ENCOUNTER — Other Ambulatory Visit: Payer: Self-pay | Admitting: Family Medicine

## 2019-09-05 DIAGNOSIS — J984 Other disorders of lung: Secondary | ICD-10-CM | POA: Diagnosis not present

## 2019-09-08 ENCOUNTER — Other Ambulatory Visit: Payer: Self-pay | Admitting: Family Medicine

## 2019-10-03 DIAGNOSIS — J984 Other disorders of lung: Secondary | ICD-10-CM | POA: Diagnosis not present

## 2019-10-07 ENCOUNTER — Ambulatory Visit: Payer: Medicare Other | Admitting: Family Medicine

## 2019-11-03 DIAGNOSIS — J984 Other disorders of lung: Secondary | ICD-10-CM | POA: Diagnosis not present

## 2019-11-20 ENCOUNTER — Other Ambulatory Visit: Payer: Self-pay | Admitting: Family Medicine

## 2019-12-03 DIAGNOSIS — J984 Other disorders of lung: Secondary | ICD-10-CM | POA: Diagnosis not present

## 2020-01-03 DIAGNOSIS — J984 Other disorders of lung: Secondary | ICD-10-CM | POA: Diagnosis not present

## 2020-02-02 DIAGNOSIS — J984 Other disorders of lung: Secondary | ICD-10-CM | POA: Diagnosis not present

## 2020-02-07 ENCOUNTER — Other Ambulatory Visit: Payer: Self-pay | Admitting: *Deleted

## 2020-02-07 ENCOUNTER — Telehealth: Payer: Self-pay | Admitting: Family Medicine

## 2020-02-07 DIAGNOSIS — I10 Essential (primary) hypertension: Secondary | ICD-10-CM

## 2020-02-07 DIAGNOSIS — Z131 Encounter for screening for diabetes mellitus: Secondary | ICD-10-CM

## 2020-02-07 DIAGNOSIS — E785 Hyperlipidemia, unspecified: Secondary | ICD-10-CM

## 2020-02-07 NOTE — Telephone Encounter (Signed)
Labs have been updated for pt

## 2020-02-07 NOTE — Telephone Encounter (Signed)
Pt needing updated lab orders before her appt on 7/28

## 2020-02-15 DIAGNOSIS — R7301 Impaired fasting glucose: Secondary | ICD-10-CM | POA: Diagnosis not present

## 2020-02-15 DIAGNOSIS — E785 Hyperlipidemia, unspecified: Secondary | ICD-10-CM | POA: Diagnosis not present

## 2020-02-15 DIAGNOSIS — I10 Essential (primary) hypertension: Secondary | ICD-10-CM | POA: Diagnosis not present

## 2020-02-15 DIAGNOSIS — Z131 Encounter for screening for diabetes mellitus: Secondary | ICD-10-CM | POA: Diagnosis not present

## 2020-02-16 ENCOUNTER — Other Ambulatory Visit: Payer: Self-pay

## 2020-02-16 ENCOUNTER — Ambulatory Visit (INDEPENDENT_AMBULATORY_CARE_PROVIDER_SITE_OTHER): Payer: Medicare Other | Admitting: Family Medicine

## 2020-02-16 DIAGNOSIS — F419 Anxiety disorder, unspecified: Secondary | ICD-10-CM

## 2020-02-16 DIAGNOSIS — F329 Major depressive disorder, single episode, unspecified: Secondary | ICD-10-CM

## 2020-02-16 DIAGNOSIS — R7303 Prediabetes: Secondary | ICD-10-CM | POA: Diagnosis not present

## 2020-02-16 DIAGNOSIS — I272 Pulmonary hypertension, unspecified: Secondary | ICD-10-CM | POA: Diagnosis not present

## 2020-02-16 DIAGNOSIS — E785 Hyperlipidemia, unspecified: Secondary | ICD-10-CM | POA: Diagnosis not present

## 2020-02-16 DIAGNOSIS — I10 Essential (primary) hypertension: Secondary | ICD-10-CM | POA: Diagnosis not present

## 2020-02-16 LAB — HEMOGLOBIN A1C
Hgb A1c MFr Bld: 6 % of total Hgb — ABNORMAL HIGH (ref <5.7)
Mean Plasma Glucose: 126 (calc)
eAG (mmol/L): 7 (calc)

## 2020-02-16 LAB — COMPLETE METABOLIC PANEL WITH GFR
AG Ratio: 1.3 (calc) (ref 1.0–2.5)
ALT: 9 U/L (ref 6–29)
AST: 14 U/L (ref 10–35)
Albumin: 3.8 g/dL (ref 3.6–5.1)
Alkaline phosphatase (APISO): 60 U/L (ref 37–153)
BUN: 17 mg/dL (ref 7–25)
CO2: 29 mmol/L (ref 20–32)
Calcium: 9.2 mg/dL (ref 8.6–10.4)
Chloride: 104 mmol/L (ref 98–110)
Creat: 0.78 mg/dL (ref 0.60–0.93)
GFR, Est African American: 88 mL/min/{1.73_m2} (ref >=60)
GFR, Est Non African American: 76 mL/min/{1.73_m2} (ref >=60)
Globulin: 2.9 g/dL (calc) (ref 1.9–3.7)
Glucose, Bld: 125 mg/dL — ABNORMAL HIGH (ref 65–99)
Potassium: 3.9 mmol/L (ref 3.5–5.3)
Sodium: 143 mmol/L (ref 135–146)
Total Bilirubin: 0.8 mg/dL (ref 0.2–1.2)
Total Protein: 6.7 g/dL (ref 6.1–8.1)

## 2020-02-16 LAB — LIPID PANEL
Cholesterol: 183 mg/dL (ref <200)
HDL: 68 mg/dL (ref >=50)
LDL Cholesterol (Calc): 100 mg/dL (calc) — ABNORMAL HIGH
Non-HDL Cholesterol (Calc): 115 mg/dL (calc) (ref <130)
Total CHOL/HDL Ratio: 2.7 (calc) (ref <5.0)
Triglycerides: 63 mg/dL (ref <150)

## 2020-02-16 MED ORDER — GABAPENTIN 300 MG PO CAPS: 300.0000 mg | ORAL_CAPSULE | Freq: Every day | ORAL | 1 refills | Status: DC

## 2020-02-16 MED ORDER — AMLODIPINE BESYLATE 5 MG PO TABS: ORAL_TABLET | ORAL | 3 refills | Status: DC

## 2020-02-16 NOTE — Patient Instructions (Signed)
Annual physical exam with MD early September, blood pressure reevaluated at that visit.  Call if you need me sooner.  Blood work is excellent.  Glycohemoglobin in office today.  Mammogram to be scheduled before you leave today.  Blood pressure is too high.  New additional medication is amlodipine 5 mg 1 at bedtime.  Please continue your current blood pressure medications as you are taking them now.  It is important that you exercise regularly at least 30 minutes 5 times a week. If you develop chest pain, have severe difficulty breathing, or feel very tired, stop exercising immediately and seek medical attention  Think about what you will eat, plan ahead. Choose " clean, green, fresh or frozen" over canned, processed or packaged foods which are more sugary, salty and fatty. 70 to 75% of food eaten should be vegetables and fruit. Three meals at set times with snacks allowed between meals, but they must be fruit or vegetables. Aim to eat over a 12 hour period , example 7 am to 7 pm, and STOP after  your last meal of the day. Drink water,generally about 64 ounces per day, no other drink is as healthy. Fruit juice is best enjoyed in a healthy way, by EATING the fruit.   Thanks for choosing [facility], we consider it a privelige to serve you.

## 2020-02-16 NOTE — Progress Notes (Signed)
[name]     MRN: [id number]      DOB: [birth year]   HPI [name] is here for follow up and re-evaluation of chronic medical conditions, medication management and review of any available recent lab and radiology data.  Preventive health is updated, specifically  Cancer screening and Immunization.   Questions or concerns regarding consultations or procedures which the PT has had in the interim are  addressed. The PT denies any adverse reactions to current medications since the last visit.  C/o left 5th finger pain and stiffness intermittently, no interest I Ortho at this time  ROS Denies recent fever or chills. Denies sinus pressure, nasal congestion, ear pain or sore throat. Denies chest congestion, productive cough or wheezing. Denies chest pains, palpitations and leg swelling Denies abdominal pain, nausea, vomiting,diarrhea or constipation.   Denies dysuria, frequency, hesitancy or incontinence. Denies uncontrolled  joint pain, swelling and limitation in mobility. Denies headaches, seizures, numbness, or tingling. Denies uncontrolled depression, anxiety or insomnia. Denies skin break down or rash.   PE  BP (!) 165/79   Pulse 79   Ht 5' (1.524 m)   Wt 194 lb (88 kg)   SpO2 96%   BMI 37.89 kg/m   Patient alert and oriented and in no cardiopulmonary distress.  HEENT: No facial asymmetry, EOMI,     Neck supple .  Chest: Clear to auscultation bilaterally.  CVS: S1, S2 no murmurs, no S3.Regular rate.  ABD: Soft non tender.   Ext: No edema  MS: Decreased  ROM spine, shoulders, hips and knees.  Skin: Intact, no ulcerations or rash noted.  Psych: Good eye contact, normal affect. Memory intact not anxious or depressed appearing.  CNS: CN 2-12 intact, power,  normal throughout.no focal deficits noted.   Assessment & Plan  Essential hypertension Uncontrolled , med adjustment return in 6 to 8 weeks DASH diet and commitment to daily physical activity for a  minimum of 30 minutes discussed and encouraged, as a part of hypertension management. The importance of attaining a healthy weight is also discussed.  BP/Weight 02/16/2020 08/17/2019 06/30/2019 06/14/2019 06/09/2019 04/21/2019 03/30/2019  Systolic BP 165 154 173 124 160 160 160  Diastolic BP 79 74 80 80 82 82 80  Wt. (Lbs) 194 195.04 197 - 203 203 201  BMI 37.89 38.09 38.47 - 39.65 39.65 39.26       Hyperlipidemia LDL goal <100 Hyperlipidemia:Low fat diet discussed and encouraged.   Lipid Panel  Lab Results  Component Value Date   CHOL 183 02/15/2020   HDL 68 02/15/2020   LDLCALC 100 (H) 02/15/2020   TRIG 63 02/15/2020   CHOLHDL 2.7 02/15/2020  needs to lower fat intake     Morbid obesity (HCC) Obesity linked with hTN and arthritis  Patient re-educated about  the importance of commitment to a  minimum of 150 minutes of exercise per week as able.  The importance of healthy food choices with portion control discussed, as well as eating regularly and within a 12 hour window most days. The need to choose "clean , green" food 50 to 75% of the time is discussed, as well as to make water the primary drink and set a goal of 64 ounces water daily.    Weight /BMI 02/16/2020 08/17/2019 06/30/2019  WEIGHT 194 lb 195 lb 0.6 oz 197 lb  HEIGHT 5\' 0"  5\' 0"  5\' 0"   BMI 37.89 kg/m2 38.09 kg/m2 38.47 kg/m2      Prediabetes Patient educated about  the importance of limiting  Carbohydrate intake , the need to commit to daily physical activity for a minimum of 30 minutes , and to commit weight loss. The fact that changes in all these areas will reduce or eliminate all together the development of diabetes is stressed.   Diabetic Labs Latest Ref Rng & Units 02/15/2020 04/14/2019 12/18/2018 05/28/2018 12/29/2017  HbA1c <5.7 % of total Hgb 6.0(H) - 5.8(H) 6.0(H) -  Microalbumin 0.00 - 1.89 mg/dL - - - - -  Micro/Creat Ratio 0.0 - 30.0 mg/g - - - - -  Chol <200 mg/dL 322 025(K) 270 - 623  HDL > OR = 50  mg/dL 68 66 64 - 67  Calc LDL mg/dL (calc) 762(G) 315(V) 89 - 94  Triglycerides <150 mg/dL 63 67 77 - 73  Creatinine 0.60 - 0.93 mg/dL 7.61 6.07 3.71 0.62 6.94   BP/Weight 02/16/2020 08/17/2019 06/30/2019 06/14/2019 06/09/2019 04/21/2019 03/30/2019  Systolic BP 165 154 173 124 160 160 160  Diastolic BP 79 74 80 80 82 82 80  Wt. (Lbs) 194 195.04 197 - 203 203 201  BMI 37.89 38.09 38.47 - 39.65 39.65 39.26   Foot/eye exam completion dates 03/13/2010  Foot exam Order yes  Foot Form Completion -      Pulmonary hypertension maintained on daily cialias and followed by pulmonary  Anxiety and depression Controlled, no change in medication

## 2020-02-19 ENCOUNTER — Encounter: Payer: Self-pay | Admitting: Family Medicine

## 2020-02-19 NOTE — Assessment & Plan Note (Signed)
Patient educated about the importance of limiting  Carbohydrate intake , the need to commit to daily physical activity for a minimum of 30 minutes , and to commit weight loss. The fact that changes in all these areas will reduce or eliminate all together the development of diabetes is stressed.   Diabetic Labs Latest Ref Rng & Units 02/15/2020 04/14/2019 12/18/2018 05/28/2018 12/29/2017  HbA1c <5.7 % of total Hgb 6.0(H) - 5.8(H) 6.0(H) -  Microalbumin 0.00 - 1.89 mg/dL - - - - -  Micro/Creat Ratio 0.0 - 30.0 mg/g - - - - -  Chol <200 mg/dL 751 025(E) 527 - 782  HDL > OR = 50 mg/dL 68 66 64 - 67  Calc LDL mg/dL (calc) 423(N) 361(W) 89 - 94  Triglycerides <150 mg/dL 63 67 77 - 73  Creatinine 0.60 - 0.93 mg/dL 4.31 5.40 0.86 7.61 9.50   BP/Weight 02/16/2020 08/17/2019 06/30/2019 06/14/2019 06/09/2019 04/21/2019 03/30/2019  Systolic BP 165 154 173 124 160 160 160  Diastolic BP 79 74 80 80 82 82 80  Wt. (Lbs) 194 195.04 197 - 203 203 201  BMI 37.89 38.09 38.47 - 39.65 39.65 39.26   Foot/eye exam completion dates 03/13/2010  Foot exam Order yes  Foot Form Completion -

## 2020-02-19 NOTE — Assessment & Plan Note (Signed)
maintained on daily cialias and followed by pulmonary

## 2020-02-19 NOTE — Assessment & Plan Note (Signed)
Controlled, no change in medication  

## 2020-02-19 NOTE — Assessment & Plan Note (Signed)
Obesity linked with hTN and arthritis  Patient re-educated about  the importance of commitment to a  minimum of 150 minutes of exercise per week as able.  The importance of healthy food choices with portion control discussed, as well as eating regularly and within a 12 hour window most days. The need to choose "clean , green" food 50 to 75% of the time is discussed, as well as to make water the primary drink and set a goal of 64 ounces water daily.    Weight /BMI 02/16/2020 08/17/2019 06/30/2019  WEIGHT 194 lb 195 lb 0.6 oz 197 lb  HEIGHT 5\' 0"  5\' 0"  5\' 0"   BMI 37.89 kg/m2 38.09 kg/m2 38.47 kg/m2

## 2020-02-19 NOTE — Assessment & Plan Note (Signed)
Hyperlipidemia:Low fat diet discussed and encouraged.   Lipid Panel  Lab Results  Component Value Date   CHOL 183 02/15/2020   HDL 68 02/15/2020   LDLCALC 100 (H) 02/15/2020   TRIG 63 02/15/2020   CHOLHDL 2.7 02/15/2020  needs to lower fat intake

## 2020-02-19 NOTE — Assessment & Plan Note (Signed)
Uncontrolled , med adjustment return in 6 to 8 weeks DASH diet and commitment to daily physical activity for a minimum of 30 minutes discussed and encouraged, as a part of hypertension management. The importance of attaining a healthy weight is also discussed.  BP/Weight 02/16/2020 08/17/2019 06/30/2019 06/14/2019 06/09/2019 04/21/2019 03/30/2019  Systolic BP 165 154 173 124 160 160 160  Diastolic BP 79 74 80 80 82 82 80  Wt. (Lbs) 194 195.04 197 - 203 203 201  BMI 37.89 38.09 38.47 - 39.65 39.65 39.26

## 2020-02-22 ENCOUNTER — Other Ambulatory Visit: Payer: Self-pay

## 2020-02-22 MED ORDER — ALENDRONATE SODIUM 70 MG PO TABS: ORAL_TABLET | ORAL | 3 refills | Status: DC

## 2020-03-04 DIAGNOSIS — J984 Other disorders of lung: Secondary | ICD-10-CM | POA: Diagnosis not present

## 2020-03-20 ENCOUNTER — Other Ambulatory Visit: Payer: Self-pay

## 2020-03-20 VITALS — BP 165/79 | Ht 60.0 in | Wt 194.0 lb

## 2020-03-20 NOTE — Progress Notes (Signed)
This encounter was created in error - please disregard.

## 2020-03-21 ENCOUNTER — Encounter: Payer: Medicare Other | Admitting: Family Medicine

## 2020-04-04 DIAGNOSIS — J984 Other disorders of lung: Secondary | ICD-10-CM | POA: Diagnosis not present

## 2020-04-10 ENCOUNTER — Telehealth: Payer: Medicare Other

## 2020-04-13 ENCOUNTER — Encounter: Payer: Self-pay | Admitting: Family Medicine

## 2020-04-13 ENCOUNTER — Ambulatory Visit (INDEPENDENT_AMBULATORY_CARE_PROVIDER_SITE_OTHER): Payer: Medicare Other | Admitting: Family Medicine

## 2020-04-13 ENCOUNTER — Other Ambulatory Visit: Payer: Self-pay

## 2020-04-13 VITALS — BP 120/78 | HR 96 | Resp 16 | Ht 60.0 in | Wt 198.0 lb

## 2020-04-13 DIAGNOSIS — B369 Superficial mycosis, unspecified: Secondary | ICD-10-CM | POA: Diagnosis not present

## 2020-04-13 DIAGNOSIS — E785 Hyperlipidemia, unspecified: Secondary | ICD-10-CM

## 2020-04-13 DIAGNOSIS — Z23 Encounter for immunization: Secondary | ICD-10-CM | POA: Diagnosis not present

## 2020-04-13 DIAGNOSIS — G8929 Other chronic pain: Secondary | ICD-10-CM

## 2020-04-13 DIAGNOSIS — M5440 Lumbago with sciatica, unspecified side: Secondary | ICD-10-CM

## 2020-04-13 DIAGNOSIS — Z1231 Encounter for screening mammogram for malignant neoplasm of breast: Secondary | ICD-10-CM

## 2020-04-13 DIAGNOSIS — E559 Vitamin D deficiency, unspecified: Secondary | ICD-10-CM

## 2020-04-13 DIAGNOSIS — Z0000 Encounter for general adult medical examination without abnormal findings: Secondary | ICD-10-CM | POA: Diagnosis not present

## 2020-04-13 DIAGNOSIS — I10 Essential (primary) hypertension: Secondary | ICD-10-CM | POA: Diagnosis not present

## 2020-04-13 MED ORDER — NYSTATIN 100000 UNIT/GM EX POWD: 1.0000 "application " | Freq: Three times a day (TID) | CUTANEOUS | 0 refills | Status: DC

## 2020-04-13 MED ORDER — CLOTRIMAZOLE-BETAMETHASONE 1-0.05 % EX CREA
TOPICAL_CREAM | CUTANEOUS | 1 refills | Status: DC
Start: 2020-04-13 — End: 2023-05-29

## 2020-04-13 MED ORDER — TERBINAFINE HCL 250 MG PO TABS: 250.0000 mg | ORAL_TABLET | Freq: Every day | ORAL | 0 refills | Status: AC

## 2020-04-13 MED ORDER — FLUCONAZOLE 100 MG PO TABS
100.0000 mg | ORAL_TABLET | Freq: Every day | ORAL | 0 refills | Status: AC
Start: 2020-04-13 — End: 2020-04-18

## 2020-04-13 NOTE — Progress Notes (Signed)
    [name]     MRN: [id number]      DOB: [birth year]  HPI: Patient is in for annual physical exam. C/o itchy rash in groin no drainage. Recent labs, if available are reviewed. Immunization is reviewed , and  updated.   PE: BP 120/78   Pulse 96   Resp 16   Ht 5' (1.524 m)   Wt 198 lb (89.8 kg)   SpO2 98%   BMI 38.67 kg/m   Pleasant  female, alert and oriented x 3, in no cardio-pulmonary distress. Afebrile. HEENT No facial trauma or asymetry. Sinuses non tender.  Extra occullar muscles intact.. External ears normal, . Neck: supple, no adenopathy,JVD or thyromegaly.No bruits.  Chest: Clear to ascultation bilaterally.No crackles or wheezes. Non tender to palpation  Breast: No asymetry,no masses or lumps. No tenderness. No nipple discharge or inversion. No axillary or supraclavicular adenopathy  Cardiovascular system; Heart sounds normal,  S1 and  S2 ,no S3.  No murmur, or thrill. Apical beat not displaced Peripheral pulses normal.  Abdomen: Soft, non tender, no organomegaly or masses. No bruits. Bowel sounds normal. No guarding, tenderness or rebound.      Musculoskeletal exam: Decreased  ROM of lumbar spine,adequate in  hips , shoulders and knees. No deformity ,swelling or crepitus noted. No muscle wasting or atrophy.   Neurologic: Cranial nerves 2 to 12 intact. Power, tone ,sensation and reflexes normal throughout. No disturbance in gait. No tremor.  Skin: Intact,macular rash in groin , yeast and fungus. Pigmentation normal throughout  Psych; Normal mood and affect. Judgement and concentration normal   Assessment & Plan:   Annual physical exam Annual exam as documented. Counseling done  re healthy lifestyle involving commitment to 150 minutes exercise per week, heart healthy diet, and attaining healthy weight.The importance of adequate sleep also discussed. Regular seat belt use and home safety, is also discussed. Changes in health  habits are decided on by the patient with goals and time frames  set for achieving them. Immunization and cancer screening needs are specifically addressed at this visit.   Dermatomycosis Antifungal and yeast medications prescribed  Essential hypertension Controlled, no change in medication   Back pain C/o intermittent flares , poreso in recent times as she has to assist her spouse more often. No new or concerning symptoms of nerve compression. Tylenol recommended

## 2020-04-13 NOTE — Patient Instructions (Addendum)
F/u in office with MD end January, calll you need me before  Flu vaccine today  TSH,  lipid, vitamin d today  Terbinafine tablets, fluconazole tablet, monistat` powder and terbinafine cream are prescribed for rash in your groin   Blood pressure is excellent  It is important that you exercise regularly at least 30 minutes 5 times a week. If you develop chest pain, have severe difficulty breathing, or feel very tired, stop exercising immediately and seek medical attention   Think about what you will eat, plan ahead. Choose " clean, green, fresh or frozen" over canned, processed or packaged foods which are more sugary, salty and fatty. 70 to 75% of food eaten should be vegetables and fruit. Three meals at set times with snacks allowed between meals, but they must be fruit or vegetables. Aim to eat over a 12 hour period , example 7 am to 7 pm, and STOP after  your last meal of the day. Drink water,generally about 64 ounces per day, no other drink is as healthy. Fruit juice is best enjoyed in a healthy way, by EATING the fruit. Thanks for choosing [facility], we consider it a privelige to serve you. Weight loss goal of 5 pounds

## 2020-04-14 LAB — VITAMIN D 25 HYDROXY (VIT D DEFICIENCY, FRACTURES): Vit D, 25-Hydroxy: 23.1 ng/mL — ABNORMAL LOW (ref 30.0–100.0)

## 2020-04-14 LAB — LIPID PANEL
Chol/HDL Ratio: 2.2 ratio (ref 0.0–4.4)
Cholesterol, Total: 177 mg/dL (ref 100–199)
HDL: 82 mg/dL (ref >39)
LDL Chol Calc (NIH): 83 mg/dL (ref 0–99)
Triglycerides: 63 mg/dL (ref 0–149)
VLDL Cholesterol Cal: 12 mg/dL (ref 5–40)

## 2020-04-14 LAB — TSH: TSH: 1.92 u[IU]/mL (ref 0.450–4.500)

## 2020-04-15 ENCOUNTER — Other Ambulatory Visit: Payer: Self-pay | Admitting: Family Medicine

## 2020-04-15 ENCOUNTER — Encounter: Payer: Self-pay | Admitting: Family Medicine

## 2020-04-15 NOTE — Assessment & Plan Note (Signed)

## 2020-04-15 NOTE — Assessment & Plan Note (Signed)
Controlled, no change in medication  

## 2020-04-15 NOTE — Assessment & Plan Note (Signed)
Antifungal and yeast medications prescribed

## 2020-04-15 NOTE — Assessment & Plan Note (Signed)
C/o intermittent flares , poreso in recent times as she has to assist her spouse more often. No new or concerning symptoms of nerve compression. Tylenol recommended

## 2020-04-18 NOTE — Progress Notes (Signed)
LVM for pt to call the office regarding lab results  °

## 2020-04-27 ENCOUNTER — Other Ambulatory Visit: Payer: Self-pay | Admitting: Family Medicine

## 2020-05-01 ENCOUNTER — Other Ambulatory Visit: Payer: Self-pay

## 2020-05-01 ENCOUNTER — Telehealth (INDEPENDENT_AMBULATORY_CARE_PROVIDER_SITE_OTHER): Payer: Medicare Other

## 2020-05-01 VITALS — BP 120/78 | Ht 60.0 in | Wt 194.0 lb

## 2020-05-01 DIAGNOSIS — Z0000 Encounter for general adult medical examination without abnormal findings: Secondary | ICD-10-CM | POA: Diagnosis not present

## 2020-05-01 NOTE — Progress Notes (Signed)
Subjective:   [name] is a 74 y.o. female who presents for Medicare Annual (Subsequent) preventive examination.  Review of Systems    Cardiac Risk Factors include: advanced age (>72men, >30 women);hypertension     Objective:    Today's Vitals   05/01/20 0912 05/01/20 0917  BP: 120/78   Weight: 194 lb (88 kg)   Height: 5' (1.524 m)   PainSc: 0-No pain 0-No pain   Body mass index is 37.89 kg/m.  Advanced Directives 05/01/2020 03/19/2019 08/28/2018 08/25/2018 08/20/2018 08/19/2018 08/12/2018  Does Patient Have a Medical Advance Directive? No No No No No No No  Type of Advance Directive - - - - - - -  Does patient want to make changes to medical advance directive? - - - - - - -  Copy of Healthcare Power of Attorney in Chart? - - - - - - -  Would patient like information on creating a medical advance directive? No - Patient declined No - Patient declined No - Patient declined - - - -  Pre-existing out of facility DNR order (yellow form or pink MOST form) - - - - - - -    Current Medications (verified) Outpatient Encounter Medications as of 05/01/2020  Medication Sig  . alendronate (FOSAMAX) 70 MG tablet TAKE 1 TABLET BY MOUTH  WEEKLY WITH 8 OZ OF PLAIN  WATER 30 MINUTES BEFORE  FIRST FOOD, DRINK OR MEDS.  STAY UPRIGHT FOR 30 MINS  . amLODipine (NORVASC) 10 MG tablet Take 1 tablet (10 mg total) by mouth daily.  Marland Kitchen aspirin (ASPIRIN LOW DOSE) 81 MG EC tablet Take 81 mg by mouth daily. Take one tablet by mouth once a day   . atorvastatin (LIPITOR) 20 MG tablet TAKE 1 TABLET BY MOUTH EVERY DAY  . Calcium Carbonate-Vit D-Min (CALCIUM 600 + MINERALS) 600-200 MG-UNIT TABS Take 1 tablet by mouth 3 (three) times daily.   . clotrimazole-betamethasone (LOTRISONE) cream Apply cream twice daily to rash in groin for 10 days, then as needed  . gabapentin (NEURONTIN) 300 MG capsule Take 1 capsule (300 mg total) by mouth at bedtime.  Marland Kitchen Hydrocortisone (GERHARDT'S BUTT CREAM) CREA Apply 1  application topically 3 (three) times daily.  Marland Kitchen losartan-hydrochlorothiazide (HYZAAR) 100-12.5 MG tablet Take 1 tablet by mouth daily.  . metoprolol succinate (TOPROL-XL) 50 MG 24 hr tablet TAKE 1 TABLET BY MOUTH  DAILY WITH OR IMMEDIATELY  FOLLOWING A MEAL  . nystatin (NYSTATIN) powder Apply 1 application topically 3 (three) times daily.  Marland Kitchen olopatadine (PATANOL) 0.1 % ophthalmic solution INSTILL 1 DROP INTO BOTH EYES TWICE A DAY  . omeprazole (PRILOSEC) 20 MG capsule TAKE 1 CAPSULE BY MOUTH  DAILY  . PROAIR HFA 108 (90 Base) MCG/ACT inhaler USE 2 PUFFS INTO LUNGS EVERY 6 HOURS AS NEEDED FOR WHEEZING OR SHORTNESS OF BREATH  . pyridOXINE (VITAMIN B-6) 100 MG tablet Take 200 mg by mouth daily.  . sertraline (ZOLOFT) 25 MG tablet TAKE 1 TABLET BY MOUTH  DAILY  . sildenafil (REVATIO) 20 MG tablet Take 60 mg by mouth daily.    No facility-administered encounter medications on file as of 05/01/2020.    Allergies (verified) Patient has no known allergies.   History: Past Medical History:  Diagnosis Date  . Asthma   . Back pain   . Depression   . Diverticulosis of colon   . GERD (gastroesophageal reflux disease)   . Hyperlipidemia   . Hypertension   . Obesity   .  Osteoarthrosis, unspecified whether generalized or localized, lower leg    bilateral knees   . Pulmonary hypertension (HCC)    Past Surgical History:  Procedure Laterality Date  . ABDOMINAL HYSTERECTOMY    . COLONOSCOPY  2005   Dr. Jerolyn Shin Smith:numerous large scattered diverticula  . COLONOSCOPY N/A 11/15/2013   Dr. Darrick Penna: moderate diverticula, small internal hemorrhoids, redudant colon. Next TCS in 2025 with overtube.   . ESOPHAGOGASTRODUODENOSCOPY (EGD) WITH ESOPHAGEAL DILATION N/A 11/15/2013   Dr. 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 moderate erosive gastritis, negative H.pylori  . TOTAL KNEE ARTHROPLASTY  06/01/04   left / Dr. Romeo Apple  . TOTAL KNEE ARTHROPLASTY  11/29/03   right / Dr. Romeo Apple  . TUBAL  LIGATION     Family History  Problem Relation Age of Onset  . Aneurysm Father   . Diabetes Father   . Diabetes Mother   . Hypertension Mother   . Heart disease Mother   . Hypertension Brother   . Diabetes Brother   . Diabetes Brother   . Hypertension Brother   . Colon cancer Maternal Uncle   . Hypertension Son    Social History   Socioeconomic History  . Marital status: Married    Spouse name: Not on file  . Number of children: 2  . Years of education: 26  . Highest education level: Not on file  Occupational History  . Occupation: retired     Associate Professor: UNEMPLOYED  Tobacco Use  . Smoking status: Never Smoker  . Smokeless tobacco: Never Used  Vaping Use  . Vaping Use: Never used  Substance and Sexual Activity  . Alcohol use: No  . Drug use: No  . Sexual activity: Never    Birth control/protection: Surgical  Other Topics Concern  . Not on file  Social History Narrative  . Not on file   Social Determinants of Health   Financial Resource Strain: Low Risk   . Difficulty of Paying Living Expenses: Not hard at all  Food Insecurity: No Food Insecurity  . Worried About Programme researcher, broadcasting/film/video in the Last Year: Never true  . Ran Out of Food in the Last Year: Never true  Transportation Needs: No Transportation Needs  . Lack of Transportation (Medical): No  . Lack of Transportation (Non-Medical): No  Physical Activity: Sufficiently Active  . Days of Exercise per Week: 3 days  . Minutes of Exercise per Session: 60 min  Stress: No Stress Concern Present  . Feeling of Stress : Only a little  Social Connections: Moderately Integrated  . Frequency of Communication with Friends and Family: More than three times a week  . Frequency of Social Gatherings with Friends and Family: Once a week  . Attends Religious Services: Never  . Active Member of Clubs or Organizations: Yes  . Attends Banker Meetings: 1 to 4 times per year  . Marital Status: Married    Tobacco  Counseling Counseling given: Not Answered   Clinical Intake:  Pre-visit preparation completed: Yes  Pain : No/denies pain Pain Score: 0-No pain     BMI - recorded: 37.89 Nutritional Status: BMI > 30  Obese Nutritional Risks: None Diabetes: No  How often do you need to have someone help you when you read instructions, pamphlets, or other written materials from your doctor or pharmacy?: 1 - Never What is the last grade level you completed in school?: 12  Diabetic?no  Interpreter Needed?: No      Activities of Daily  Living In your present state of health, do you have any difficulty performing the following activities: 05/01/2020  Hearing? N  Vision? N  Difficulty concentrating or making decisions? N  Walking or climbing stairs? N  Dressing or bathing? N  Doing errands, shopping? N  Preparing Food and eating ? N  Using the Toilet? N  In the past six months, have you accidently leaked urine? N  Do you have problems with loss of bowel control? N  Managing your Medications? N  Managing your Finances? N  Housekeeping or managing your Housekeeping? N  Some recent data might be hidden    Patient Care Team: Kerri Perches, MD as PCP - [facility], Darleene Cleaver, MD (Inactive) as Consulting Physician (Gastroenterology)  Indicate any recent Medical Services you may have received from other than [facility] providers in the past year (date may be approximate).     Assessment:   This is a routine wellness examination for Zayleigh.  Hearing/Vision screen No exam data present  Dietary issues and exercise activities discussed: Current Exercise Habits: Structured exercise class, Type of exercise: Other - see comments (water aerobics), Time (Minutes): 60, Frequency (Times/Week): 3, Weekly Exercise (Minutes/Week): 180, Intensity: Moderate, Exercise limited by: None identified  Goals    . Increase water intake     Recommend increasing your water intake to 64 ounces a day.     .  Weight (lb) < 200 lb (90.7 kg)     Wants to lose 20lbs.      Depression Screen PHQ 2/9 Scores 05/01/2020 05/01/2020 02/16/2020 08/17/2019 06/09/2019 03/30/2019 03/19/2019  PHQ - 2 Score 0 0 0 0 0 0 0  PHQ- 9 Score - - 1 - 2 - -    Fall Risk Fall Risk  05/01/2020 04/13/2020 08/17/2019 06/09/2019 04/21/2019  Falls in the past year? 0 0 0 0 0  Number falls in past yr: 0 0 0 0 0  Injury with Fall? 0 0 0 0 0  Risk for fall due to : No Fall Risks - - - -  Follow up Falls evaluation completed - - - -    Any stairs in or around the home? Yes  If so, are there any without handrails? No  Home free of loose throw rugs in walkways, pet beds, electrical cords, etc? Yes  Adequate lighting in your home to reduce risk of falls? Yes   ASSISTIVE DEVICES UTILIZED TO PREVENT FALLS:  Life alert? No  Use of a cane, walker or w/c? No  Grab bars in the bathroom? Yes  Shower chair or bench in shower? No  Elevated toilet seat or a handicapped toilet? No   TIMED UP AND GO:  Was the test performed? No .  Length of time to ambulate 10 feet: na sec.     Cognitive Function:     6CIT Screen 05/01/2020 05/01/2020 03/19/2019 03/16/2018 03/12/2017  What Year? 0 points 0 points 0 points 0 points 0 points  What month? 0 points 0 points 0 points 0 points 0 points  What time? 0 points 0 points 0 points 0 points 0 points  Count back from 20 0 points - 0 points 0 points 0 points  Months in reverse 0 points - 2 points 0 points 0 points  Repeat phrase 0 points - 2 points 2 points 0 points  Total Score 0 - 4 2 0    Immunizations Immunization History  Administered Date(s) Administered  . Fluad Quad(high Dose 65+) 04/21/2019, 04/13/2020  .  H1N1 05/23/2008  . Influenza Split 04/02/2012, 04/21/2014, 03/05/2016  . Influenza Whole 04/08/2007, 05/01/2009, 04/03/2010  . Influenza,inj,Quad PF,6+ Mos 03/30/2015, 03/12/2017, 03/16/2018  . Moderna SARS-COVID-2 Vaccination 10/25/2019, 11/22/2019  . Pneumococcal  Conjugate-13 08/04/2014  . Pneumococcal Polysaccharide-23 01/16/2004, 06/12/2010, 09/14/2015  . Td 01/16/2004  . Zoster 07/20/2008    TDAP status: Up to date Flu Vaccine status: Up to date Pneumococcal vaccine status: Up to date Covid-19 vaccine status: Completed vaccines  Qualifies for Shingles Vaccine? Yes   Zostavax completed Yes   Shingrix Completed?: Yes  Screening Tests Health Maintenance  Topic Date Due  . TETANUS/TDAP  01/31/2022 (Originally 01/15/2014)  . MAMMOGRAM  04/28/2021  . COLONOSCOPY  11/17/2023  . INFLUENZA VACCINE  Completed  . DEXA SCAN  Completed  . COVID-19 Vaccine  Completed  . Hepatitis C Screening  Completed  . PNA vac Low Risk Adult  Completed    Health Maintenance  There are no preventive care reminders to display for this patient.  Colorectal cancer screening: Completed 11/16/13. Repeat every 10 years Mammogram status: Completed 100820. Repeat every year Bone Density status: Completed 04/16/18. Results reflect: Bone density results: NORMAL. Repeat every 5 years.  Lung Cancer Screening: (Low Dose CT Chest recommended if Age 57-80 years, 30 pack-year currently smoking OR have quit w/in 15years.) does not qualify.   Lung Cancer Screening Referral: no  Additional Screening:  Hepatitis C Screening: does not qualify; Completed   Vision Screening: Recommended annual ophthalmology exams for early detection of glaucoma and other disorders of the eye. Is the patient up to date with their annual eye exam?  No  Who is the provider or what is the name of the office in which the patient attends annual eye exams? n/a If pt is not established with a provider, would they like to be referred to a provider to establish care? No .   Dental Screening: Recommended annual dental exams for proper oral hygiene  Community Resource Referral / Chronic Care Management: CRR required this visit?  No   CCM required this visit?  No      Plan:     I have personally  reviewed and noted the following in the patient's chart:   . Medical and social history . Use of alcohol, tobacco or illicit drugs  . Current medications and supplements . Functional ability and status . Nutritional status . Physical activity . Advanced directives . List of other physicians . Hospitalizations, surgeries, and ER visits in previous 12 months . Vitals . Screenings to include cognitive, depression, and falls . Referrals and appointments  In addition, I have reviewed and discussed with patient certain preventive protocols, quality metrics, and best practice recommendations. A written personalized care plan for preventive services as well as general preventive health recommendations were provided to patient.     Jerilynn MagesBrook  Jerlyn Pain, [place]   [id number]/119110/05/2020   Nurse Notes:

## 2020-05-04 DIAGNOSIS — J984 Other disorders of lung: Secondary | ICD-10-CM | POA: Diagnosis not present

## 2020-05-05 ENCOUNTER — Ambulatory Visit
Admission: RE | Admit: 2020-05-05 | Discharge: 2020-05-05 | Disposition: A | Discharge status: Home / Self Care | Payer: Medicare Other | Source: Ambulatory Visit | Attending: Family Medicine | Admitting: Family Medicine

## 2020-05-05 ENCOUNTER — Other Ambulatory Visit: Payer: Self-pay

## 2020-05-05 DIAGNOSIS — Z1231 Encounter for screening mammogram for malignant neoplasm of breast: Secondary | ICD-10-CM | POA: Diagnosis not present

## 2020-05-09 ENCOUNTER — Other Ambulatory Visit: Payer: Self-pay

## 2020-05-09 DIAGNOSIS — Z1231 Encounter for screening mammogram for malignant neoplasm of breast: Secondary | ICD-10-CM

## 2020-05-22 ENCOUNTER — Inpatient Hospital Stay (HOSPITAL_COMMUNITY): Admission: RE | Admit: 2020-05-22 | Payer: Medicare Other | Source: Ambulatory Visit

## 2020-05-30 ENCOUNTER — Telehealth: Payer: Self-pay

## 2020-05-30 NOTE — Telephone Encounter (Signed)
Emailed 05/30/20  Dr Lodema Hong, Can you sign off on this AWV  Brandi, can you follow up on this and see if it has been signed off of?  Can you also check with the new LPN on the work flow.  Thanks  From: [name]  Sent: Monday, May 29, 2020 4:55 PM To: Jory [email or web address]> Subject: billing provider signature needed  [name], [name] [[id number]]  DOS 05/01/2020   [name],  I have ran across these [phone] holding in my WQ.  [facility] is saying no provider response.  I can't find anything they need the provider to respond on other than maybe a signature.  My question to you is, does the billing provider need to sign off on these notes by LPN (I'm thinking yes)?  This is just one example  If yes, can you help me get a signature on this one and possibility review the workflow with your staff. This one got to charge review without a signature.    [name] MBA, [facility], [facility], [facility]  SW-[employer] account manager

## 2020-06-04 DIAGNOSIS — J984 Other disorders of lung: Secondary | ICD-10-CM | POA: Diagnosis not present

## 2020-07-04 DIAGNOSIS — J984 Other disorders of lung: Secondary | ICD-10-CM | POA: Diagnosis not present

## 2020-08-04 DIAGNOSIS — J984 Other disorders of lung: Secondary | ICD-10-CM | POA: Diagnosis not present

## 2020-08-14 ENCOUNTER — Encounter: Payer: Self-pay | Admitting: Family Medicine

## 2020-08-14 ENCOUNTER — Other Ambulatory Visit: Payer: Self-pay

## 2020-08-14 ENCOUNTER — Telehealth (INDEPENDENT_AMBULATORY_CARE_PROVIDER_SITE_OTHER): Payer: Medicare Other | Admitting: Family Medicine

## 2020-08-14 DIAGNOSIS — R7303 Prediabetes: Secondary | ICD-10-CM | POA: Diagnosis not present

## 2020-08-14 DIAGNOSIS — M25512 Pain in left shoulder: Secondary | ICD-10-CM | POA: Insufficient documentation

## 2020-08-14 DIAGNOSIS — I272 Pulmonary hypertension, unspecified: Secondary | ICD-10-CM | POA: Diagnosis not present

## 2020-08-14 DIAGNOSIS — R7301 Impaired fasting glucose: Secondary | ICD-10-CM

## 2020-08-14 DIAGNOSIS — E785 Hyperlipidemia, unspecified: Secondary | ICD-10-CM

## 2020-08-14 DIAGNOSIS — M25511 Pain in right shoulder: Secondary | ICD-10-CM

## 2020-08-14 DIAGNOSIS — F419 Anxiety disorder, unspecified: Secondary | ICD-10-CM

## 2020-08-14 DIAGNOSIS — G8929 Other chronic pain: Secondary | ICD-10-CM

## 2020-08-14 DIAGNOSIS — F32A Depression, unspecified: Secondary | ICD-10-CM

## 2020-08-14 DIAGNOSIS — I10 Essential (primary) hypertension: Secondary | ICD-10-CM

## 2020-08-14 MED ORDER — MELOXICAM 15 MG PO TABS: 15.0000 mg | ORAL_TABLET | Freq: Every day | ORAL | 0 refills | Status: DC

## 2020-08-14 MED ORDER — PREDNISONE 10 MG PO TABS: 10.0000 mg | ORAL_TABLET | Freq: Two times a day (BID) | ORAL | 0 refills | Status: DC

## 2020-08-14 MED ORDER — GABAPENTIN 300 MG PO CAPS
300.0000 mg | ORAL_CAPSULE | Freq: Every day | ORAL | 1 refills | Status: DC
Start: 2020-08-14 — End: 2020-12-12

## 2020-08-14 NOTE — Patient Instructions (Addendum)
F/U with MD in 4 months, call if you need me before  Meloxicam and prednisone are prescribed for shoulder pain and you are referred to Dr Aline Brochure also  Keep being active inside and outside of the house  Start vitamin D3, 5000 IU once daily, this is OTC   Please do not take aspirin for the next 10 days   Please get fasting lipid, cmp and EGFr, HBA1C and CBC tomorrow morning  It is important that you exercise regularly at least 30 minutes 5 times a week. If you develop chest pain, have severe difficulty breathing, or feel very tired, stop exercising immediately and seek medical attention  Think about what you will eat, plan ahead. Choose " clean, green, fresh or frozen" over canned, processed or packaged foods which are more sugary, salty and fatty. 70 to 75% of food eaten should be vegetables and fruit. Three meals at set times with snacks allowed between meals, but they must be fruit or vegetables. Aim to eat over a 12 hour period , example 7 am to 7 pm, and STOP after  your last meal of the day. Drink water,generally about 64 ounces per day, no other drink is as healthy. Fruit juice is best enjoyed in a healthy way, by EATING the fruit. Thanks for choosing [facility], we consider it a privelige to serve you.

## 2020-08-14 NOTE — Progress Notes (Signed)
Virtual Visit via Telephone Note  I connected with [name] on 08/14/20 at  2:20 PM EST by telephone and verified that I am speaking with the correct person using two identifiers.  Location: Patient: home Provider: office   I discussed the limitations, risks, security and privacy concerns of performing an evaluation and management service by telephone and the availability of in person appointments. I also discussed with the patient that there may be a patient responsible charge related to this service. The patient expressed understanding and agreed to proceed.   History of Present Illness: F/U chronic problems and update routine health C/o increased bilateral shoulder pain , limiting mobility, no inciting trauma Denies recent fever or chills. Denies sinus pressure, nasal congestion, ear pain or sore throat. Denies chest congestion, productive cough or wheezing. Denies chest pains, palpitations and leg swelling Denies abdominal pain, nausea, vomiting,diarrhea or constipation.   Denies dysuria, frequency, hesitancy or uncontrolled  incontinence.  Denies headaches, seizures, numbness, or tingling. Denies uncontrolled  depression, anxiety or insomnia. Denies skin break down or rash.       Observations/Objective: There were no vitals taken for this visit. Good communication with no confusion and intact memory. Alert and oriented x 3 No signs of respiratory distress during speech    Assessment and Plan: Chronic right shoulder pain Bilateral pain rated up to a 6 and limiting mobility, no known inciting trauma  Bilateral shoulder pain Uncontrolled pain with limitation in mobility, short  Course oral anti inflammatories and Ortho eval  Anxiety and depression Controlled, no change in medication   Essential hypertension DASH diet and commitment to daily physical activity for a minimum of 30 minutes discussed and encouraged, as a part of hypertension management. The  importance of attaining a healthy weight is also discussed.  BP/Weight 05/01/2020 04/13/2020 03/20/2020 02/16/2020 08/17/2019 06/30/2019 06/14/2019  Systolic BP 120 120 165 165 154 173 124  Diastolic BP 78 78 79 79 74 80 80  Wt. (Lbs) 194 198 194 194 195.04 197 -  BMI 37.89 38.67 37.89 37.89 38.09 38.47 -       Hyperlipidemia LDL goal <100 Hyperlipidemia:Low fat diet discussed and encouraged.   Lipid Panel  Lab Results  Component Value Date   CHOL 177 04/13/2020   HDL 82 04/13/2020   LDLCALC 83 04/13/2020   TRIG 63 04/13/2020   CHOLHDL 2.2 04/13/2020     Controlled, no change in medication   Morbid obesity (HCC)  Patient re-educated about  the importance of commitment to a  minimum of 150 minutes of exercise per week as able.  The importance of healthy food choices with portion control discussed, as well as eating regularly and within a 12 hour window most days. The need to choose "clean , green" food 50 to 75% of the time is discussed, as well as to make water the primary drink and set a goal of 64 ounces water daily.    Weight /BMI 05/01/2020 04/13/2020 03/20/2020  WEIGHT 194 lb 198 lb 194 lb  HEIGHT 5\' 0"  5\' 0"  5\' 0"   BMI 37.89 kg/m2 38.67 kg/m2 37.89 kg/m2      Prediabetes Patient educated about the importance of limiting  Carbohydrate intake , the need to commit to daily physical activity for a minimum of 30 minutes , and to commit weight loss. The fact that changes in all these areas will reduce or eliminate all together the development of diabetes is stressed.   Diabetic Labs Latest Ref Rng & Units  04/13/2020 02/15/2020 04/14/2019 12/18/2018 05/28/2018  HbA1c <5.7 % of total Hgb - 6.0(H) - 5.8(H) 6.0(H)  Microalbumin 0.00 - 1.89 mg/dL - - - - -  Micro/Creat Ratio 0.0 - 30.0 mg/g - - - - -  Chol 100 - 199 mg/dL 027 741 287(O) 676 -  HDL >39 mg/dL 82 68 66 64 -  Calc LDL 0 - 99 mg/dL 83 720(N) 470(J) 89 -  Triglycerides 0 - 149 mg/dL 63 63 67 77 -  Creatinine 0.60 -  0.93 mg/dL - 6.28 3.66 2.94 7.65   BP/Weight 05/01/2020 04/13/2020 03/20/2020 02/16/2020 08/17/2019 06/30/2019 06/14/2019  Systolic BP 120 120 165 165 154 173 124  Diastolic BP 78 78 79 79 74 80 80  Wt. (Lbs) 194 198 194 194 195.04 197 -  BMI 37.89 38.67 37.89 37.89 38.09 38.47 -   Foot/eye exam completion dates 03/13/2010  Foot exam Order yes  Foot Form Completion -        Follow Up Instructions:    I discussed the assessment and treatment plan with the patient. The patient was provided an opportunity to ask questions and all were answered. The patient agreed with the plan and demonstrated an understanding of the instructions.   The patient was advised to call back or seek an in-person evaluation if the symptoms worsen or if the condition fails to improve as anticipated.  I provided 22 minutes of non-face-to-face time during this encounter.   Syliva Overman, MD

## 2020-08-14 NOTE — Assessment & Plan Note (Signed)
Bilateral pain rated up to a 6 and limiting mobility, no known inciting trauma

## 2020-08-15 ENCOUNTER — Encounter: Payer: Self-pay | Admitting: Family Medicine

## 2020-08-15 DIAGNOSIS — E785 Hyperlipidemia, unspecified: Secondary | ICD-10-CM | POA: Diagnosis not present

## 2020-08-15 DIAGNOSIS — I272 Pulmonary hypertension, unspecified: Secondary | ICD-10-CM | POA: Diagnosis not present

## 2020-08-15 DIAGNOSIS — R7301 Impaired fasting glucose: Secondary | ICD-10-CM | POA: Diagnosis not present

## 2020-08-15 NOTE — Assessment & Plan Note (Signed)
Controlled, no change in medication  

## 2020-08-15 NOTE — Assessment & Plan Note (Signed)
Uncontrolled pain with limitation in mobility, short  Course oral anti inflammatories and Ortho eval

## 2020-08-15 NOTE — Assessment & Plan Note (Signed)
Patient educated about the importance of limiting  Carbohydrate intake , the need to commit to daily physical activity for a minimum of 30 minutes , and to commit weight loss. The fact that changes in all these areas will reduce or eliminate all together the development of diabetes is stressed.   Diabetic Labs Latest Ref Rng & Units 04/13/2020 02/15/2020 04/14/2019 12/18/2018 05/28/2018  HbA1c <5.7 % of total Hgb - 6.0(H) - 5.8(H) 6.0(H)  Microalbumin 0.00 - 1.89 mg/dL - - - - -  Micro/Creat Ratio 0.0 - 30.0 mg/g - - - - -  Chol 100 - 199 mg/dL 631 497 026(V) 785 -  HDL >39 mg/dL 82 68 66 64 -  Calc LDL 0 - 99 mg/dL 83 885(O) 277(A) 89 -  Triglycerides 0 - 149 mg/dL 63 63 67 77 -  Creatinine 0.60 - 0.93 mg/dL - 1.28 7.86 7.67 2.09   BP/Weight 05/01/2020 04/13/2020 03/20/2020 02/16/2020 08/17/2019 06/30/2019 06/14/2019  Systolic BP 120 120 165 165 154 173 124  Diastolic BP 78 78 79 79 74 80 80  Wt. (Lbs) 194 198 194 194 195.04 197 -  BMI 37.89 38.67 37.89 37.89 38.09 38.47 -   Foot/eye exam completion dates 03/13/2010  Foot exam Order yes  Foot Form Completion -

## 2020-08-15 NOTE — Assessment & Plan Note (Signed)
Hyperlipidemia:Low fat diet discussed and encouraged.   Lipid Panel  Lab Results  Component Value Date   CHOL 177 04/13/2020   HDL 82 04/13/2020   LDLCALC 83 04/13/2020   TRIG 63 04/13/2020   CHOLHDL 2.2 04/13/2020     Controlled, no change in medication

## 2020-08-15 NOTE — Assessment & Plan Note (Signed)
  Patient re-educated about  the importance of commitment to a  minimum of 150 minutes of exercise per week as able.  The importance of healthy food choices with portion control discussed, as well as eating regularly and within a 12 hour window most days. The need to choose "clean , green" food 50 to 75% of the time is discussed, as well as to make water the primary drink and set a goal of 64 ounces water daily.    Weight /BMI 05/01/2020 04/13/2020 03/20/2020  WEIGHT 194 lb 198 lb 194 lb  HEIGHT 5\' 0"  5\' 0"  5\' 0"   BMI 37.89 kg/m2 38.67 kg/m2 37.89 kg/m2

## 2020-08-15 NOTE — Assessment & Plan Note (Signed)
DASH diet and commitment to daily physical activity for a minimum of 30 minutes discussed and encouraged, as a part of hypertension management. The importance of attaining a healthy weight is also discussed.  BP/Weight 05/01/2020 04/13/2020 03/20/2020 02/16/2020 08/17/2019 06/30/2019 06/14/2019  Systolic BP 120 120 165 165 154 173 124  Diastolic BP 78 78 79 79 74 80 80  Wt. (Lbs) 194 198 194 194 195.04 197 -  BMI 37.89 38.67 37.89 37.89 38.09 38.47 -

## 2020-08-16 ENCOUNTER — Other Ambulatory Visit: Payer: Self-pay | Admitting: Family Medicine

## 2020-08-16 LAB — CBC
Hematocrit: 38.3 % (ref 34.0–46.6)
Hemoglobin: 12.9 g/dL (ref 11.1–15.9)
MCH: 29.9 pg (ref 26.6–33.0)
MCHC: 33.7 g/dL (ref 31.5–35.7)
MCV: 89 fL (ref 79–97)
Platelets: 192 10*3/uL (ref 150–450)
RBC: 4.31 x10E6/uL (ref 3.77–5.28)
RDW: 12.7 % (ref 11.7–15.4)
WBC: 9.2 10*3/uL (ref 3.4–10.8)

## 2020-08-16 LAB — CMP14+EGFR
ALT: 10 IU/L (ref 0–32)
AST: 17 IU/L (ref 0–40)
Albumin/Globulin Ratio: 1.4 (ref 1.2–2.2)
Albumin: 4 g/dL (ref 3.7–4.7)
Alkaline Phosphatase: 73 IU/L (ref 44–121)
BUN/Creatinine Ratio: 14 (ref 12–28)
BUN: 11 mg/dL (ref 8–27)
Bilirubin Total: 0.9 mg/dL (ref 0.0–1.2)
CO2: 26 mmol/L (ref 20–29)
Calcium: 9.8 mg/dL (ref 8.7–10.3)
Chloride: 105 mmol/L (ref 96–106)
Creatinine, Ser: 0.77 mg/dL (ref 0.57–1.00)
GFR calc Af Amer: 89 mL/min/{1.73_m2} (ref >59)
GFR calc non Af Amer: 77 mL/min/{1.73_m2} (ref >59)
Globulin, Total: 2.8 g/dL (ref 1.5–4.5)
Glucose: 135 mg/dL — ABNORMAL HIGH (ref 65–99)
Potassium: 3.2 mmol/L — ABNORMAL LOW (ref 3.5–5.2)
Sodium: 149 mmol/L — ABNORMAL HIGH (ref 134–144)
Total Protein: 6.8 g/dL (ref 6.0–8.5)

## 2020-08-16 LAB — HEMOGLOBIN A1C
Est. average glucose Bld gHb Est-mCnc: 131 mg/dL
Hgb A1c MFr Bld: 6.2 % — ABNORMAL HIGH (ref 4.8–5.6)

## 2020-08-16 LAB — LIPID PANEL
Chol/HDL Ratio: 2.2 ratio (ref 0.0–4.4)
Cholesterol, Total: 164 mg/dL (ref 100–199)
HDL: 73 mg/dL (ref >39)
LDL Chol Calc (NIH): 79 mg/dL (ref 0–99)
Triglycerides: 58 mg/dL (ref 0–149)
VLDL Cholesterol Cal: 12 mg/dL (ref 5–40)

## 2020-08-16 MED ORDER — POTASSIUM CHLORIDE CRYS ER 20 MEQ PO TBCR: 20.0000 meq | EXTENDED_RELEASE_TABLET | Freq: Every day | ORAL | 1 refills | Status: DC

## 2020-08-21 ENCOUNTER — Other Ambulatory Visit: Payer: Self-pay

## 2020-08-21 ENCOUNTER — Ambulatory Visit (INDEPENDENT_AMBULATORY_CARE_PROVIDER_SITE_OTHER): Payer: Medicare Other | Admitting: Orthopedic Surgery

## 2020-08-21 ENCOUNTER — Ambulatory Visit: Payer: Medicare Other

## 2020-08-21 ENCOUNTER — Encounter: Payer: Self-pay | Admitting: Orthopedic Surgery

## 2020-08-21 DIAGNOSIS — G8929 Other chronic pain: Secondary | ICD-10-CM

## 2020-08-21 DIAGNOSIS — M75122 Complete rotator cuff tear or rupture of left shoulder, not specified as traumatic: Secondary | ICD-10-CM | POA: Diagnosis not present

## 2020-08-21 DIAGNOSIS — M75121 Complete rotator cuff tear or rupture of right shoulder, not specified as traumatic: Secondary | ICD-10-CM

## 2020-08-21 DIAGNOSIS — M25512 Pain in left shoulder: Secondary | ICD-10-CM | POA: Diagnosis not present

## 2020-08-21 DIAGNOSIS — M25511 Pain in right shoulder: Secondary | ICD-10-CM | POA: Diagnosis not present

## 2020-08-21 MED ORDER — PREDNISONE 10 MG PO TABS: 10.0000 mg | ORAL_TABLET | Freq: Two times a day (BID) | ORAL | 2 refills | Status: DC

## 2020-08-21 NOTE — Patient Instructions (Signed)
Go to physical therapy for 4 weeks twice a day  Take the prednisone only when you can lift her arms above your head

## 2020-08-21 NOTE — Progress Notes (Signed)
Patient ID: [name], female   DOB: 12/27/1945, 75 y.o.   MRN: [id number]  ASSESSMENT AND PLAN:  75 year old female with chronic rotator cuff disease however currently she can still get her arms above her head she does has weakness soreness and fatigue  Recommend physical therapy and prednisone as needed    Chief Complaint  Patient presents with  . Shoulder Pain    Bilateral pain, patient reports no better,       HPI [name] is a 75 y.o. female.  Evaluation bilateral shoulder pain  75 year old female bilateral shoulder pain started a few weeks back went to primary care they gave her some prednisone for 5 days she says it helped  She is basically having weakness with activities requiring overhead use of her arms she is also having fatigue with her arms above her head with activities of daily living   Treatment:  Nsaids yes, prednisone 10 mg twice daily for 5 days PT no Injection no   Review of Systems ROS  Past Medical History:  Diagnosis Date  . Asthma   . Back pain   . Depression   . Diverticulosis of colon   . GERD (gastroesophageal reflux disease)   . Hyperlipidemia   . Hypertension   . Obesity   . Osteoarthrosis, unspecified whether generalized or localized, lower leg    bilateral knees   . Pulmonary hypertension (HCC)     Past Surgical History:  Procedure Laterality Date  . ABDOMINAL HYSTERECTOMY    . COLONOSCOPY  2005   Dr. Jerolyn Shin Smith:numerous large scattered diverticula  . COLONOSCOPY N/A 11/15/2013   Dr. Darrick Penna: moderate diverticula, small internal hemorrhoids, redudant colon. Next TCS in 2025 with overtube.   . ESOPHAGOGASTRODUODENOSCOPY (EGD) WITH ESOPHAGEAL DILATION N/A 11/15/2013   Dr. 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 moderate erosive gastritis, negative H.pylori  . TOTAL KNEE ARTHROPLASTY  06/01/04   left / Dr. Romeo Apple  . TOTAL KNEE ARTHROPLASTY  11/29/03   right / Dr. Romeo Apple  . TUBAL LIGATION      Family  History  Problem Relation Age of Onset  . Aneurysm Father   . Diabetes Father   . Diabetes Mother   . Hypertension Mother   . Heart disease Mother   . Hypertension Brother   . Diabetes Brother   . Diabetes Brother   . Hypertension Brother   . Colon cancer Maternal Uncle   . Hypertension Son   . Breast cancer Maternal Aunt      Social History   Tobacco Use  . Smoking status: Never Smoker  . Smokeless tobacco: Never Used  Vaping Use  . Vaping Use: Never used  Substance Use Topics  . Alcohol use: No  . Drug use: No    No Known Allergies    Current Meds  Medication Sig  . alendronate (FOSAMAX) 70 MG tablet TAKE 1 TABLET BY MOUTH  WEEKLY WITH 8 OZ OF PLAIN  WATER 30 MINUTES BEFORE  FIRST FOOD, DRINK OR MEDS.  STAY UPRIGHT FOR 30 MINS  . amLODipine (NORVASC) 10 MG tablet Take 1 tablet (10 mg total) by mouth daily.  Marland Kitchen aspirin 81 MG EC tablet Take 81 mg by mouth daily. Take one tablet by mouth once a day  . atorvastatin (LIPITOR) 20 MG tablet TAKE 1 TABLET BY MOUTH EVERY DAY  . Calcium Carbonate-Vit D-Min (CALCIUM 600 + MINERALS) 600-200 MG-UNIT TABS Take 1 tablet by mouth 3 (three) times daily.   Marland Kitchen  clotrimazole-betamethasone (LOTRISONE) cream Apply cream twice daily to rash in groin for 10 days, then as needed  . gabapentin (NEURONTIN) 300 MG capsule Take 1 capsule (300 mg total) by mouth at bedtime.  Marland Kitchen Hydrocortisone (GERHARDT'S BUTT CREAM) CREA Apply 1 application topically 3 (three) times daily.  Marland Kitchen losartan-hydrochlorothiazide (HYZAAR) 100-12.5 MG tablet Take 1 tablet by mouth daily.  . meloxicam (MOBIC) 15 MG tablet Take 1 tablet (15 mg total) by mouth daily.  . metoprolol succinate (TOPROL-XL) 50 MG 24 hr tablet TAKE 1 TABLET BY MOUTH  DAILY WITH OR IMMEDIATELY  FOLLOWING A MEAL  . nystatin (NYSTATIN) powder Apply 1 application topically 3 (three) times daily.  Marland Kitchen olopatadine (PATANOL) 0.1 % ophthalmic solution INSTILL 1 DROP INTO BOTH EYES TWICE A DAY  . omeprazole  (PRILOSEC) 20 MG capsule TAKE 1 CAPSULE BY MOUTH  DAILY  . potassium chloride SA (KLOR-CON) 20 MEQ tablet Take 1 tablet (20 mEq total) by mouth daily.  Marland Kitchen PROAIR HFA 108 (90 Base) MCG/ACT inhaler USE 2 PUFFS INTO LUNGS EVERY 6 HOURS AS NEEDED FOR WHEEZING OR SHORTNESS OF BREATH  . pyridOXINE (VITAMIN B-6) 100 MG tablet Take 200 mg by mouth daily.  . sertraline (ZOLOFT) 25 MG tablet TAKE 1 TABLET BY MOUTH  DAILY  . sildenafil (REVATIO) 20 MG tablet Take 60 mg by mouth daily.   . [DISCONTINUED] predniSONE (DELTASONE) 10 MG tablet Take 1 tablet (10 mg total) by mouth 2 (two) times daily with a meal.       Physical Exam BP 126/63   Pulse 76   Ht 5' (1.524 m)   Wt 192 lb (87.1 kg)   BMI 37.50 kg/m   Ambulatory status normal with no assistive devices  GENERAL : APPEARANCE IS NORMAL GROOMING IS GOOD  NORMAL MOOD AND AFFECT  AWAKE ALERT AND ORIENTED X 3   Right SHOULDER  TENDERNESS none ROM full STABLE ANTERIORLY POSTERIORLY AND INFERIORLY SKIN CLEAN     PROVOCATIVE TESTS  DROP ARM TEST negative PAINFUL ARC none EMPTY CAN -JOBST TEST 4 out of 5 weakness EXTERNAL ROTATION LAG TEST negative LIFT OFF TEST normal BELLYPRESS TEST normal   Left SHOULDER  TENDERNESS none ROM full STABLE ANTERIORLY POSTERIORLY AND INFERIORLY SKIN CLEAN     PROVOCATIVE TESTS  DROP ARM TEST negative PAINFUL ARC none EMPTY CAN -JOBST TEST 4 out of 5 weakness EXTERNAL ROTATION LAG TEST normal LIFT OFF TEST normal BELLYPRESS TEST normal   MEDICAL DECISION MAKING  A.  Encounter Diagnoses  Name Primary?  . Chronic right shoulder pain   . Chronic left shoulder pain     B. DATA ANALYSED:    IMAGING: Independent interpretation of images: Internal images bilateral rotator cuff disease with normal glenohumeral joint but abnormal humeral head to acromial distance  Orders: N physical therapy  Outside records reviewed: None  C. MANAGEMENT physical therapy  Take this medication for  severe pain soreness or loss of function  Meds ordered this encounter  Medications  . predniSONE (DELTASONE) 10 MG tablet    Sig: Take 1 tablet (10 mg total) by mouth 2 (two) times daily with a meal.    Dispense:  10 tablet    Refill:  2

## 2020-08-25 ENCOUNTER — Other Ambulatory Visit: Payer: Self-pay

## 2020-09-04 DIAGNOSIS — J984 Other disorders of lung: Secondary | ICD-10-CM | POA: Diagnosis not present

## 2020-09-06 ENCOUNTER — Ambulatory Visit (HOSPITAL_COMMUNITY): Payer: Medicare Other | Attending: Orthopedic Surgery | Admitting: Specialist

## 2020-09-06 ENCOUNTER — Encounter (HOSPITAL_COMMUNITY): Payer: Self-pay | Admitting: Specialist

## 2020-09-06 ENCOUNTER — Other Ambulatory Visit: Payer: Self-pay

## 2020-09-06 DIAGNOSIS — M25512 Pain in left shoulder: Secondary | ICD-10-CM | POA: Diagnosis not present

## 2020-09-06 DIAGNOSIS — M25611 Stiffness of right shoulder, not elsewhere classified: Secondary | ICD-10-CM | POA: Diagnosis not present

## 2020-09-06 DIAGNOSIS — R29898 Other symptoms and signs involving the musculoskeletal system: Secondary | ICD-10-CM | POA: Insufficient documentation

## 2020-09-06 DIAGNOSIS — M25612 Stiffness of left shoulder, not elsewhere classified: Secondary | ICD-10-CM | POA: Insufficient documentation

## 2020-09-06 DIAGNOSIS — M25511 Pain in right shoulder: Secondary | ICD-10-CM | POA: Insufficient documentation

## 2020-09-06 DIAGNOSIS — G8929 Other chronic pain: Secondary | ICD-10-CM | POA: Insufficient documentation

## 2020-09-06 NOTE — Therapy (Signed)
[facility] [address] , [place], [address] Phone: [phone]   Fax:  [phone]  Occupational Therapy Evaluation  Patient Details  Name: [name] MRN: [id number] Date of Birth: January 14, 1946 Referring Provider (OT): Dr. Fuller CanadaStanley Harrison   Encounter Date: 09/06/2020   OT End of Session - 09/06/20 2059    Visit Number 1    Number of Visits 8    Date for OT Re-Evaluation 10/04/20    Authorization Type [employer] Medicare, no visit limit or auth required    Progress Note Due on Visit 10    OT Start Time 1540    OT Stop Time 1620    OT Time Calculation (min) 40 min    Activity Tolerance Patient tolerated treatment well    Behavior During Therapy [facility] for tasks assessed/performed           Past Medical History:  Diagnosis Date  . Asthma   . Back pain   . Depression   . Diverticulosis of colon   . GERD (gastroesophageal reflux disease)   . Hyperlipidemia   . Hypertension   . Obesity   . Osteoarthrosis, unspecified whether generalized or localized, lower leg    bilateral knees   . Pulmonary hypertension (HCC)     Past Surgical History:  Procedure Laterality Date  . ABDOMINAL HYSTERECTOMY    . COLONOSCOPY  2005   Dr. Jerolyn ShinLeroy Smith:numerous large scattered diverticula  . COLONOSCOPY N/A 11/15/2013   Dr. Darrick PennaFields: moderate diverticula, small internal hemorrhoids, redudant colon. Next TCS in 2025 with overtube.   . ESOPHAGOGASTRODUODENOSCOPY (EGD) WITH ESOPHAGEAL DILATION N/A 11/15/2013   Dr. 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 moderate erosive gastritis, negative H.pylori  . TOTAL KNEE ARTHROPLASTY  06/01/04   left / Dr. Romeo AppleHArrison  . TOTAL KNEE ARTHROPLASTY  11/29/03   right / Dr. Romeo AppleHarrison  . TUBAL LIGATION      There were no vitals filed for this visit.   Subjective Assessment - 09/06/20 2053    Subjective  S:  I dont want to have surgery    Pertinent History [name] states that her shoulders began  causing her pain and having limited mobility approximately 9 months ago.  She participates in water aerobics 3 days a week and often experiences pain and has difficulty lifting her arms over her head.  She consulted with Dr. Romeo AppleHarrison and has been referred to occupational therapy for evaluation and treatment.    Patient Stated Goals lift arms overhead with less pain    Currently in Pain? Yes    Pain Score 6     Pain Location Shoulder    Pain Orientation Right;Left    Pain Descriptors / Indicators Aching    Pain Type Chronic pain    Pain Radiating Towards upper arm    Pain Onset More than a month ago    Pain Frequency Intermittent    Aggravating Factors  reaching over head or lifting    Pain Relieving Factors rest heat    Effect of Pain on Daily Activities minimal.  states that a good day pain is 6/10, pain can increase to 10/10             [facility] - 09/06/20 0001      Assessment   Medical Diagnosis BUE Shoulder Pain    Referring Provider (OT) Dr. Fuller CanadaStanley Harrison    Onset Date/Surgical Date --   chronic   Hand Dominance Right  Next MD Visit unknown    Prior Therapy n/a      Precautions   Precautions None      Restrictions   Weight Bearing Restrictions No      Balance Screen   Has the patient fallen in the past 6 months No    Has the patient had a decrease in activity level because of a fear of falling?  No    Is the patient reluctant to leave their home because of a fear of falling?  No      Home  Environment   Family/patient expects to be discharged to: Private residence    Living Arrangements Spouse/significant other      Prior Function   Level of Independence Independent;Independent with basic ADLs    Vocation Retired    Industrial/product designer, crocheting, quilting,      ADL   ADL comments completes all major cooking and cleaning of home.  has pain and difficulty with reaching into closet, overhead cabinet, behind back.  lifting heavy items is painful  and difficult      Written Expression   Dominant Hand Right      Vision - History   Baseline Vision No visual deficits      Cognition   Overall Cognitive Status Within Functional Limits for tasks assessed      Observation/Other Assessments   Focus on Therapeutic Outcomes (FOTO)  58% independent      Sensation   Light Touch Appears Intact      Coordination   Gross Motor Movements are Fluid and Coordinated Yes    Fine Motor Movements are Fluid and Coordinated Yes      ROM / Strength   AROM / PROM / Strength AROM;Strength      Palpation   Palpation comment min-mod fascial restrictions in bilateral scapular regions      AROM   Overall AROM Comments assessed in supine, external and internal rotation with shoulder adducted    AROM Assessment Site Shoulder    Right/Left Shoulder Right;Left    Right Shoulder Flexion 150 Degrees    Right Shoulder ABduction 175 Degrees    Right Shoulder Internal Rotation 90 Degrees    Right Shoulder External Rotation 0 Degrees   PROM 15   Left Shoulder Flexion 165 Degrees    Left Shoulder ABduction 160 Degrees    Left Shoulder Internal Rotation 90 Degrees    Left Shoulder External Rotation 30 Degrees   PROM 30     Strength   Overall Strength Comments proximal shoulder stabilty is good - , strength assessed in seated    Strength Assessment Site Shoulder    Right/Left Shoulder Right;Left    Right Shoulder Flexion 4-/5    Right Shoulder ABduction 4-/5    Right Shoulder Internal Rotation 4-/5    Right Shoulder External Rotation 4-/5    Left Shoulder Flexion 4-/5    Left Shoulder ABduction 4-/5    Left Shoulder Internal Rotation 4-/5    Left Shoulder External Rotation 4-/5                           OT Education - 09/06/20 2057    Education Details educated on aa/rom in seated and/or supine    Person(s) Educated Patient    Methods Explanation;Demonstration;Handout    Comprehension Verbalized understanding;Returned  demonstration            OT Short Term Goals - 09/06/20 2104  OT SHORT TERM GOAL #1   Title Patient will be educated on HEP for improved scapular stability and ROM required for ADL completion.    Time 2    Period Weeks    Status New    Target Date 09/20/20      OT SHORT TERM GOAL #2   Title Patient will decrease pain and soreness in her shoulders from 6/10 to 4/10 or better when participating in water aerobics.    Time 2    Period Weeks    Status New             OT Long Term Goals - 09/06/20 2105      OT LONG TERM GOAL #1   Title Patient will complete daily tasks with BUE at highest level of independence possible.    Time 4    Period Weeks    Status New    Target Date 10/04/20      OT LONG TERM GOAL #2   Title Patient will decrease pain and soreness to 3/10 or better in bilateral shoulders when reaching into overhead cabinets.    Time 4    Period Weeks    Status New      OT LONG TERM GOAL #3   Title Patient will improve BUE shoulder AROM by 25% for improved independence with tasks reaching overhead or behind her back.    Time 4    Period Weeks    Status New      OT LONG TERM GOAL #4   Title Patient will improve proximal shoulder strength from 4/5 to 5/5 for improved stability needed for reaching and lifting.    Time 4    Period Weeks    Status New                 Plan - 09/06/20 2100    Clinical Impression Statement A:  Patient is a 75 year old female with past medical history significant for bilateral knee replacement, HTN, high cholesterol.  Patient states she has been experiencing pain and limited mobility in her shoulders for almost 9 months.  She has difficulty reaching overhead and behind her back, which causes her to have difficulty and increased pain with daily tasks.    OT Occupational Profile and History Problem Focused Assessment - Including review of records relating to presenting problem    Occupational performance deficits (Please  refer to evaluation for details): ADL's;IADL's    Body Structure / Function / Physical Skills ADL;UE functional use;Fascial restriction;IADL;Pain;Strength;ROM    Rehab Potential Good    Clinical Decision Making Limited treatment options, no task modification necessary    Comorbidities Affecting Occupational Performance: None    Modification or Assistance to Complete Evaluation  No modification of tasks or assist necessary to complete eval    OT Frequency 2x / week    OT Duration 4 weeks    OT Treatment/Interventions Self-care/ADL training;Cryotherapy;Therapeutic exercise;DME and/or AE instruction;Electrical Stimulation;Neuromuscular education;Manual Therapy;Patient/family education;Therapeutic activities;Passive range of motion;Energy conservation;Moist Heat;Iontophoresis    Plan P:  SKilled OT intervention to decrease pain and restrictions and improve pain free mobility in her shoulders when completing daily tasks.  Next session:  begin manual therapy, review hep, aa/rom progressing to a/rom, scapular stability.    OT Home Exercise Plan initial eval:  aa/rom           Patient will benefit from skilled therapeutic intervention in order to improve the following deficits and impairments:   Body Structure / Function /  Physical Skills: ADL,UE functional use,Fascial restriction,IADL,Pain,Strength,ROM       Visit Diagnosis: Stiffness of left shoulder, not elsewhere classified - Plan: Ot plan of care cert/re-cert  Chronic left shoulder pain - Plan: Ot plan of care cert/re-cert  Chronic right shoulder pain - Plan: Ot plan of care cert/re-cert  Stiffness of right shoulder, not elsewhere classified - Plan: Ot plan of care cert/re-cert  Other symptoms and signs involving the musculoskeletal system - Plan: Ot plan of care cert/re-cert    Problem List Patient Active Problem List   Diagnosis Date Noted  . Bilateral shoulder pain 08/14/2020  . Trigger finger, left little finger 06/09/2019  .  Alteration in skin integrity due to moisture 03/30/2019  . Chronic right shoulder pain 07/05/2018  . Intermittent knee pain 05/28/2018  . Vitamin D deficiency 11/22/2015  . Back pain 09/15/2013  . Dermatomycosis 12/22/2012  . Carpal tunnel syndrome on left 07/31/2011  . Prediabetes 10/16/2007  . Hyperlipidemia LDL goal <100 10/16/2007  . Morbid obesity (HCC) 10/16/2007  . Anxiety and depression 10/16/2007  . Essential hypertension 10/16/2007  . Pulmonary hypertension (HCC) 10/16/2007  . GERD 10/16/2007  . DIVERTICULOSIS OF COLON 10/16/2007  . Irena Cords, 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    Shirlean Mylar, MHA, OTR/L [phone]  09/06/2020, 9:19 PM  [facility] [address] [place], [place], [address] Phone: [phone]   Fax:  [phone]  Name: [name] MRN: [id number] Date of Birth: Nov 01, 1945

## 2020-09-06 NOTE — Patient Instructions (Signed)

## 2020-09-13 ENCOUNTER — Ambulatory Visit (HOSPITAL_COMMUNITY): Payer: Medicare Other | Admitting: Occupational Therapy

## 2020-09-13 ENCOUNTER — Telehealth (HOSPITAL_COMMUNITY): Payer: Self-pay | Admitting: Occupational Therapy

## 2020-09-13 NOTE — Telephone Encounter (Signed)
Called pt regarding no show for OT appt today at 1:45. No answer, left voicemail reminding pt of next appt on Friday 2/25 at 9:45am and requested pt call if unable to attend.    Ezra Sites, OTR/L  [phone] 09/13/20

## 2020-09-15 ENCOUNTER — Other Ambulatory Visit: Payer: Self-pay

## 2020-09-15 ENCOUNTER — Encounter (HOSPITAL_COMMUNITY): Payer: Self-pay | Admitting: Occupational Therapy

## 2020-09-15 ENCOUNTER — Ambulatory Visit (HOSPITAL_COMMUNITY): Payer: Medicare Other | Admitting: Occupational Therapy

## 2020-09-15 DIAGNOSIS — M25512 Pain in left shoulder: Secondary | ICD-10-CM

## 2020-09-15 DIAGNOSIS — G8929 Other chronic pain: Secondary | ICD-10-CM | POA: Diagnosis not present

## 2020-09-15 DIAGNOSIS — M25612 Stiffness of left shoulder, not elsewhere classified: Secondary | ICD-10-CM | POA: Diagnosis not present

## 2020-09-15 DIAGNOSIS — M25611 Stiffness of right shoulder, not elsewhere classified: Secondary | ICD-10-CM | POA: Diagnosis not present

## 2020-09-15 DIAGNOSIS — M25511 Pain in right shoulder: Secondary | ICD-10-CM | POA: Diagnosis not present

## 2020-09-15 DIAGNOSIS — R29898 Other symptoms and signs involving the musculoskeletal system: Secondary | ICD-10-CM | POA: Diagnosis not present

## 2020-09-15 NOTE — Therapy (Signed)
[facility] [address] [place], [place], [address] Phone: [phone]   Fax:  [phone]  Occupational Therapy Treatment  Patient Details  Name: [name] MRN: [id number] Date of Birth: 06/04/1946 Referring Provider (OT): Dr. Fuller Canada   Encounter Date: 09/15/2020   OT End of Session - 09/15/20 1025    Visit Number 2    Number of Visits 8    Date for OT Re-Evaluation 10/04/20    Authorization Type UHC Medicare, no visit limit or auth required    Progress Note Due on Visit 10    OT Start Time [phone]    OT Stop Time 1026    OT Time Calculation (min) 38 min    Activity Tolerance Patient tolerated treatment well    Behavior During Therapy [employer] for tasks assessed/performed           Past Medical History:  Diagnosis Date  . Asthma   . Back pain   . Depression   . Diverticulosis of colon   . GERD (gastroesophageal reflux disease)   . Hyperlipidemia   . Hypertension   . Obesity   . Osteoarthrosis, unspecified whether generalized or localized, lower leg    bilateral knees   . Pulmonary hypertension (HCC)     Past Surgical History:  Procedure Laterality Date  . ABDOMINAL HYSTERECTOMY    . COLONOSCOPY  2005   Dr. Jerolyn Shin Smith:numerous large scattered diverticula  . COLONOSCOPY N/A 11/15/2013   Dr. Darrick Penna: moderate diverticula, small internal hemorrhoids, redudant colon. Next TCS in 2025 with overtube.   . ESOPHAGOGASTRODUODENOSCOPY (EGD) WITH ESOPHAGEAL DILATION N/A 11/15/2013   Dr. 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 moderate erosive gastritis, negative H.pylori  . TOTAL KNEE ARTHROPLASTY  06/01/04   left / Dr. Romeo Apple  . TOTAL KNEE ARTHROPLASTY  11/29/03   right / Dr. Romeo Apple  . TUBAL LIGATION      There were no vitals filed for this visit.   Subjective Assessment - 09/15/20 0947    Subjective  S: The right one always hurts the worst.    Currently in Pain? Yes    Pain Score 4     Pain Location  Shoulder    Pain Orientation Right    Pain Descriptors / Indicators Aching;Sore    Pain Type Chronic pain    Pain Radiating Towards upper arm    Pain Onset More than a month ago    Pain Frequency Intermittent    Aggravating Factors  reaching overhead or lifting    Pain Relieving Factors rest, heat    Effect of Pain on Daily Activities min effect on ADLs              [facility] OT Assessment - 09/15/20 0947      Assessment   Medical Diagnosis BUE Shoulder Pain      Precautions   Precautions None                    OT Treatments/Exercises (OP) - 09/15/20 0951      Exercises   Exercises Shoulder      Shoulder Exercises: Supine   Protraction PROM;5 reps;AROM;10 reps;Both    Horizontal ABduction PROM;5 reps;AROM;10 reps;Both    External Rotation PROM;5 reps;AROM;10 reps;Both    Internal Rotation PROM;5 reps;AROM;10 reps;Both    Flexion PROM;5 reps;AROM;10 reps;Both    ABduction PROM;5 reps;AROM;10 reps;Both      Shoulder Exercises: Standing   Protraction AROM;10 reps;Both  Horizontal ABduction AROM;10 reps;Both    External Rotation AROM;10 reps;Both    Internal Rotation AROM;10 reps;Both    Flexion AROM;10 reps;Both    ABduction AROM;10 reps;Both    Extension Theraband;10 reps    Theraband Level (Shoulder Extension) Level 2 (Red)    Row Theraband;10 reps    Theraband Level (Shoulder Row) Level 2 (Red)    Retraction Theraband;10 reps    Theraband Level (Shoulder Retraction) Level 2 (Red)      Shoulder Exercises: Pulleys   Flexion 1 minute      Manual Therapy   Manual Therapy Myofascial release    Manual therapy comments completed separately from therapeutic exercise    Myofascial Release myofascial release to right upper arm, trapezius, and scapular regions to decrease pain and fascial restrictions and improve joint ROM                    OT Short Term Goals - 09/15/20 1006      OT SHORT TERM GOAL #1   Title Patient will be educated on HEP  for improved scapular stability and ROM required for ADL completion.    Time 2    Period Weeks    Status On-going    Target Date 09/20/20      OT SHORT TERM GOAL #2   Title Patient will decrease pain and soreness in her shoulders from 6/10 to 4/10 or better when participating in water aerobics.    Time 2    Period Weeks    Status On-going             OT Long Term Goals - 09/15/20 1006      OT LONG TERM GOAL #1   Title Patient will complete daily tasks with BUE at highest level of independence possible.    Time 4    Period Weeks    Status On-going      OT LONG TERM GOAL #2   Title Patient will decrease pain and soreness to 3/10 or better in bilateral shoulders when reaching into overhead cabinets.    Time 4    Period Weeks    Status On-going      OT LONG TERM GOAL #3   Title Patient will improve BUE shoulder AROM by 25% for improved independence with tasks reaching overhead or behind her back.    Time 4    Period Weeks    Status On-going      OT LONG TERM GOAL #4   Title Patient will improve proximal shoulder strength from 4/5 to 5/5 for improved stability needed for reaching and lifting.    Time 4    Period Weeks    Status On-going                 Plan - 09/15/20 1006    Clinical Impression Statement A: Pt reporting her RUE is usually more painful than her LUE. Initiated myofascial release to RUE, P/ROM and A/ROM to BUE. Pt with ROM WFL, reports soreness at end range. Pt reporting more discomfort in right shoulder, however was able to complete all exercises. Also completed scapular theraband today. Verbal cuing for form and technique.    Body Structure / Function / Physical Skills ADL;UE functional use;Fascial restriction;IADL;Pain;Strength;ROM    Plan P: Continue with A/ROM and update HEP, add proximal shoulder strengthening supine and standing    OT Home Exercise Plan initial eval:  aa/rom    Consulted and Agree with Plan of Care Patient  Patient will benefit from skilled therapeutic intervention in order to improve the following deficits and impairments:   Body Structure / Function / Physical Skills: ADL,UE functional use,Fascial restriction,IADL,Pain,Strength,ROM       Visit Diagnosis: Chronic left shoulder pain  Chronic right shoulder pain  Stiffness of left shoulder, not elsewhere classified  Stiffness of right shoulder, not elsewhere classified  Other symptoms and signs involving the musculoskeletal system    Problem List Patient Active Problem List   Diagnosis Date Noted  . Bilateral shoulder pain 08/14/2020  . Trigger finger, left little finger 06/09/2019  . Alteration in skin integrity due to moisture 03/30/2019  . Chronic right shoulder pain 07/05/2018  . Intermittent knee pain 05/28/2018  . Vitamin D deficiency 11/22/2015  . Back pain 09/15/2013  . Dermatomycosis 12/22/2012  . Carpal tunnel syndrome on left 07/31/2011  . Prediabetes 10/16/2007  . Hyperlipidemia LDL goal <100 10/16/2007  . Morbid obesity (HCC) 10/16/2007  . Anxiety and depression 10/16/2007  . Essential hypertension 10/16/2007  . Pulmonary hypertension (HCC) 10/16/2007  . GERD 10/16/2007  . DIVERTICULOSIS OF COLON 10/16/2007  . OSTEOARTHRITIS, KNEES, 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   Ezra Sites, OTR/L  [phone] 09/15/2020, 10:26 AM  [facility] [address] [place], [place], [address] Phone: [phone]   Fax:  [phone]  Name: [name] MRN: [id number] Date of Birth: 1945-08-22

## 2020-09-19 ENCOUNTER — Ambulatory Visit (HOSPITAL_COMMUNITY): Payer: Medicare Other | Attending: Orthopedic Surgery

## 2020-09-19 ENCOUNTER — Other Ambulatory Visit: Payer: Self-pay

## 2020-09-19 ENCOUNTER — Encounter (HOSPITAL_COMMUNITY): Payer: Self-pay

## 2020-09-19 DIAGNOSIS — M25611 Stiffness of right shoulder, not elsewhere classified: Secondary | ICD-10-CM | POA: Diagnosis not present

## 2020-09-19 DIAGNOSIS — R29898 Other symptoms and signs involving the musculoskeletal system: Secondary | ICD-10-CM | POA: Insufficient documentation

## 2020-09-19 DIAGNOSIS — G8929 Other chronic pain: Secondary | ICD-10-CM | POA: Diagnosis not present

## 2020-09-19 DIAGNOSIS — M25512 Pain in left shoulder: Secondary | ICD-10-CM | POA: Diagnosis not present

## 2020-09-19 DIAGNOSIS — M25511 Pain in right shoulder: Secondary | ICD-10-CM | POA: Insufficient documentation

## 2020-09-19 DIAGNOSIS — M25612 Stiffness of left shoulder, not elsewhere classified: Secondary | ICD-10-CM | POA: Diagnosis not present

## 2020-09-19 NOTE — Patient Instructions (Signed)
Repeat all exercises 10-15 times, 1-2 times per day.  1) Shoulder Protraction    Begin with elbows by your side, slowly "punch" straight out in front of you.      2) Shoulder Flexion  Standing:         Begin with arms at your side with thumbs pointed up, slowly raise both arms up and forward towards overhead.               3) Horizontal abduction/adduction   Standing:           Begin with arms straight out in front of you, bring out to the side in at "T" shape. Keep arms straight entire time.       4) Internal & External Rotation  Standing:     Stand with elbows at the side and elbows bent 90 degrees. Move your forearms away from your body, then bring back inward toward the body.     5) Shoulder Abduction  Standing:       Begin with your arms next to your side. Slowly move your arms out to the side so that they go overhead, in a jumping jack or snow angel movement.      

## 2020-09-19 NOTE — Therapy (Signed)
[facility] [address] [place], [place], [address] Phone: [phone]   Fax:  [phone]  Occupational Therapy Treatment  Patient Details  Name: [name] MRN: [id number] Date of Birth: [birth year] Referring Provider (OT): Dr. Fuller Canada   Encounter Date: 09/19/2020   OT End of Session - 09/19/20 1546    Visit Number 3    Number of Visits 8    Date for OT Re-Evaluation 10/04/20    Authorization Type UHC Medicare, no visit limit or auth required    Progress Note Due on Visit 10    OT Start Time 1515    OT Stop Time 1553    OT Time Calculation (min) 38 min    Activity Tolerance Patient tolerated treatment well    Behavior During Therapy [facility] for tasks assessed/performed           Past Medical History:  Diagnosis Date  . Asthma   . Back pain   . Depression   . Diverticulosis of colon   . GERD (gastroesophageal reflux disease)   . Hyperlipidemia   . Hypertension   . Obesity   . Osteoarthrosis, unspecified whether generalized or localized, lower leg    bilateral knees   . Pulmonary hypertension (HCC)     Past Surgical History:  Procedure Laterality Date  . ABDOMINAL HYSTERECTOMY    . COLONOSCOPY  2005   Dr. Jerolyn Shin Smith:numerous large scattered diverticula  . COLONOSCOPY N/A 11/15/2013   Dr. Darrick Penna: moderate diverticula, small internal hemorrhoids, redudant colon. Next TCS in 2025 with overtube.   . ESOPHAGOGASTRODUODENOSCOPY (EGD) WITH ESOPHAGEAL DILATION N/A 11/15/2013   Dr. 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 moderate erosive gastritis, negative H.pylori  . TOTAL KNEE ARTHROPLASTY  06/01/04   left / Dr. Romeo Apple  . TOTAL KNEE ARTHROPLASTY  11/29/03   right / Dr. Romeo Apple  . TUBAL LIGATION      There were no vitals filed for this visit.                 OT Treatments/Exercises (OP) - 09/19/20 1520      Exercises   Exercises Shoulder      Shoulder Exercises: Supine   Protraction  PROM;5 reps;AROM;12 reps;Both    Horizontal ABduction PROM;5 reps;AROM;Both;12 reps    External Rotation PROM;5 reps;AROM;12 reps;Both    Internal Rotation PROM;5 reps;AROM;Both;12 reps    Flexion PROM;5 reps;AROM;Both;12 reps    ABduction PROM;5 reps;AROM;Both;12 reps      Shoulder Exercises: Standing   Protraction AROM;10 reps;Both    Horizontal ABduction AROM;10 reps;Both    External Rotation AROM;10 reps;Both    Internal Rotation AROM;10 reps;Both    Flexion AROM;10 reps;Both    ABduction AROM;10 reps;Both    Extension Theraband;10 reps    Theraband Level (Shoulder Extension) Level 2 (Red)    Row Theraband;10 reps    Theraband Level (Shoulder Row) Level 2 (Red)    Retraction Theraband;10 reps    Theraband Level (Shoulder Retraction) Level 2 (Red)      Shoulder Exercises: ROM/Strengthening   Wall Wash 1' each arm    Proximal Shoulder Strengthening, Supine 10X A/ROM no rest breaks    Proximal Shoulder Strengthening, Seated 10X A/ROM no rest breaks   very slow criss cross     Manual Therapy   Manual Therapy Myofascial release    Manual therapy comments completed separately from therapeutic exercise    Myofascial Release myofascial release to right upper arm, trapezius, and scapular  regions to decrease pain and fascial restrictions and improve joint ROM                  OT Education - 09/19/20 1541    Education Details A/ROM standing. may stop previous exercises: AA/ROM    Person(s) Educated Patient    Methods Explanation;Demonstration;Handout    Comprehension Returned demonstration;Verbalized understanding            OT Short Term Goals - 09/15/20 1006      OT SHORT TERM GOAL #1   Title Patient will be educated on HEP for improved scapular stability and ROM required for ADL completion.    Time 2    Period Weeks    Status On-going    Target Date 09/20/20      OT SHORT TERM GOAL #2   Title Patient will decrease pain and soreness in her shoulders from 6/10  to 4/10 or better when participating in water aerobics.    Time 2    Period Weeks    Status On-going             OT Long Term Goals - 09/15/20 1006      OT LONG TERM GOAL #1   Title Patient will complete daily tasks with BUE at highest level of independence possible.    Time 4    Period Weeks    Status On-going      OT LONG TERM GOAL #2   Title Patient will decrease pain and soreness to 3/10 or better in bilateral shoulders when reaching into overhead cabinets.    Time 4    Period Weeks    Status On-going      OT LONG TERM GOAL #3   Title Patient will improve BUE shoulder AROM by 25% for improved independence with tasks reaching overhead or behind her back.    Time 4    Period Weeks    Status On-going      OT LONG TERM GOAL #4   Title Patient will improve proximal shoulder strength from 4/5 to 5/5 for improved stability needed for reaching and lifting.    Time 4    Period Weeks    Status On-going                 Plan - 09/19/20 1607    Clinical Impression Statement A: Manual techniques completed to address moderate trigger point located at right upper trapezius region. Patient able to demonstrated full RUE P/ROM with very minimal tightness noted. LUE P/ROM is full with no tightness noted. Continued with A/ROM supine and standing. Added proximal shoulder strengthening with VC for form and technique. While standing, patient had max difficulty with demonstrating appropriate speed during criss cross motion. Updated HEP for A/ROM.    Body Structure / Function / Physical Skills ADL;UE functional use;Fascial restriction;IADL;Pain;Strength;ROM    Plan P: Complete functional reaching task using Squigz.    OT Home Exercise Plan initial eval:  AA/ROM 3/1: A/ROM    Consulted and Agree with Plan of Care Patient           Patient will benefit from skilled therapeutic intervention in order to improve the following deficits and impairments:   Body Structure / Function /  Physical Skills: ADL,UE functional use,Fascial restriction,IADL,Pain,Strength,ROM       Visit Diagnosis: Chronic right shoulder pain  Chronic left shoulder pain  Stiffness of left shoulder, not elsewhere classified  Stiffness of right shoulder, not elsewhere classified  Other symptoms and  signs involving the musculoskeletal system    Problem List Patient Active Problem List   Diagnosis Date Noted  . Bilateral shoulder pain 08/14/2020  . Trigger finger, left little finger 06/09/2019  . Alteration in skin integrity due to moisture 03/30/2019  . Chronic right shoulder pain 07/05/2018  . Intermittent knee pain 05/28/2018  . Vitamin D deficiency 11/22/2015  . Back pain 09/15/2013  . Dermatomycosis 12/22/2012  . Carpal tunnel syndrome on left 07/31/2011  . Prediabetes 10/16/2007  . Hyperlipidemia LDL goal <100 10/16/2007  . Morbid obesity (HCC) 10/16/2007  . Anxiety and depression 10/16/2007  . Essential hypertension 10/16/2007  . Pulmonary hypertension (HCC) 10/16/2007  . GERD 10/16/2007  . DIVERTICULOSIS OF COLON 10/16/2007  . OSTEOARTHRITIS, KNEES, 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   Limmie Patricia, OTR/L,CBIS  [phone]  09/19/2020, 4:21 PM  [facility] [address] [place], [place], [address] Phone: [phone]   Fax:  [phone]  Name: KATARZYNA WOLVEN MRN: [id number] Date of Birth: 15-Jul-1946

## 2020-09-21 ENCOUNTER — Encounter (HOSPITAL_COMMUNITY): Payer: Self-pay | Admitting: Occupational Therapy

## 2020-09-21 ENCOUNTER — Other Ambulatory Visit: Payer: Self-pay

## 2020-09-21 ENCOUNTER — Ambulatory Visit (HOSPITAL_COMMUNITY): Payer: Medicare Other | Admitting: Occupational Therapy

## 2020-09-21 DIAGNOSIS — M25612 Stiffness of left shoulder, not elsewhere classified: Secondary | ICD-10-CM | POA: Diagnosis not present

## 2020-09-21 DIAGNOSIS — M25611 Stiffness of right shoulder, not elsewhere classified: Secondary | ICD-10-CM | POA: Diagnosis not present

## 2020-09-21 DIAGNOSIS — R29898 Other symptoms and signs involving the musculoskeletal system: Secondary | ICD-10-CM

## 2020-09-21 DIAGNOSIS — M25511 Pain in right shoulder: Secondary | ICD-10-CM | POA: Diagnosis not present

## 2020-09-21 DIAGNOSIS — G8929 Other chronic pain: Secondary | ICD-10-CM

## 2020-09-21 DIAGNOSIS — M25512 Pain in left shoulder: Secondary | ICD-10-CM

## 2020-09-21 NOTE — Therapy (Signed)
[facility] [address] [place], [place], [address] Phone: [phone]   Fax:  [phone]  Occupational Therapy Treatment  Patient Details  Name: [name] MRN: [id number] Date of Birth: Mar 10, 1946 Referring Provider (OT): Dr. Fuller Canada   Encounter Date: 09/21/2020   OT End of Session - 09/21/20 1556    Visit Number 4    Number of Visits 8    Date for OT Re-Evaluation 10/04/20    Authorization Type UHC Medicare, no visit limit or auth required    Progress Note Due on Visit 10    OT Start Time 1515    OT Stop Time 1556    OT Time Calculation (min) 41 min    Activity Tolerance Patient tolerated treatment well    Behavior During Therapy [facility] for tasks assessed/performed           Past Medical History:  Diagnosis Date  . Asthma   . Back pain   . Depression   . Diverticulosis of colon   . GERD (gastroesophageal reflux disease)   . Hyperlipidemia   . Hypertension   . Obesity   . Osteoarthrosis, unspecified whether generalized or localized, lower leg    bilateral knees   . Pulmonary hypertension (HCC)     Past Surgical History:  Procedure Laterality Date  . ABDOMINAL HYSTERECTOMY    . COLONOSCOPY  2005   Dr. Jerolyn Shin Smith:numerous large scattered diverticula  . COLONOSCOPY N/A 11/15/2013   Dr. Darrick Penna: moderate diverticula, small internal hemorrhoids, redudant colon. Next TCS in 2025 with overtube.   . ESOPHAGOGASTRODUODENOSCOPY (EGD) WITH ESOPHAGEAL DILATION N/A 11/15/2013   Dr. 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 moderate erosive gastritis, negative H.pylori  . TOTAL KNEE ARTHROPLASTY  06/01/04   left / Dr. Romeo Apple  . TOTAL KNEE ARTHROPLASTY  11/29/03   right / Dr. Romeo Apple  . TUBAL LIGATION      There were no vitals filed for this visit.   Subjective Assessment - 09/21/20 1512    Subjective  S: It's a little stiff but doesn't hurt.    Currently in Pain? No/denies              [facility] OT  Assessment - 09/21/20 1512      Assessment   Medical Diagnosis BUE Shoulder Pain      Precautions   Precautions None                    OT Treatments/Exercises (OP) - 09/21/20 1519      Exercises   Exercises Shoulder      Shoulder Exercises: Supine   Protraction PROM;5 reps;AROM;12 reps;Both    Horizontal ABduction PROM;5 reps;AROM;Both;12 reps    External Rotation PROM;5 reps;AROM;12 reps;Both    Internal Rotation PROM;5 reps;AROM;Both;12 reps    Flexion PROM;5 reps;AROM;Both;12 reps    ABduction PROM;5 reps;AROM;Both;12 reps      Shoulder Exercises: Standing   Protraction AROM;10 reps;Both    Horizontal ABduction AROM;10 reps;Both    External Rotation AROM;10 reps;Both    Internal Rotation AROM;10 reps;Both    Flexion AROM;10 reps;Both    ABduction AROM;10 reps;Both      Shoulder Exercises: ROM/Strengthening   UBE (Upper Arm Bike) Level 1, 2' forward 2' reverse, pace: 5.0    Over Head Lace 1' each arm    X to V Arms 10X, A/ROM, BUE    Proximal Shoulder Strengthening, Supine 10X A/ROM, 1 rest break    [place] on  Wall 1' flexion BUE      Manual Therapy   Manual Therapy Myofascial release    Manual therapy comments completed separately from therapeutic exercise    Myofascial Release myofascial release to right upper arm, trapezius, and scapular regions to decrease pain and fascial restrictions and improve joint ROM                    OT Short Term Goals - 09/15/20 1006      OT SHORT TERM GOAL #1   Title Patient will be educated on HEP for improved scapular stability and ROM required for ADL completion.    Time 2    Period Weeks    Status On-going    Target Date 09/20/20      OT SHORT TERM GOAL #2   Title Patient will decrease pain and soreness in her shoulders from 6/10 to 4/10 or better when participating in water aerobics.    Time 2    Period Weeks    Status On-going             OT Long Term Goals - 09/15/20 1006      OT LONG TERM  GOAL #1   Title Patient will complete daily tasks with BUE at highest level of independence possible.    Time 4    Period Weeks    Status On-going      OT LONG TERM GOAL #2   Title Patient will decrease pain and soreness to 3/10 or better in bilateral shoulders when reaching into overhead cabinets.    Time 4    Period Weeks    Status On-going      OT LONG TERM GOAL #3   Title Patient will improve BUE shoulder AROM by 25% for improved independence with tasks reaching overhead or behind her back.    Time 4    Period Weeks    Status On-going      OT LONG TERM GOAL #4   Title Patient will improve proximal shoulder strength from 4/5 to 5/5 for improved stability needed for reaching and lifting.    Time 4    Period Weeks    Status On-going                 Plan - 09/21/20 1535    Clinical Impression Statement A: Manual techniques to right trapezius region, continued with P/ROM to RUE. Continued with A/ROM supine and standing, pt demonstrating full ROM. Continued with proximal shoulder strengthening, added ball on wall.Also added overhead lacing and UBE. Verbal cuing for form and technique.    Body Structure / Function / Physical Skills ADL;UE functional use;Fascial restriction;IADL;Pain;Strength;ROM    Plan P: continue with scapular theraband and add to HEP, continue with ball on wall    OT Home Exercise Plan initial eval:  AA/ROM 3/1: A/ROM    Consulted and Agree with Plan of Care Patient           Patient will benefit from skilled therapeutic intervention in order to improve the following deficits and impairments:   Body Structure / Function / Physical Skills: ADL,UE functional use,Fascial restriction,IADL,Pain,Strength,ROM       Visit Diagnosis: Chronic right shoulder pain  Chronic left shoulder pain  Stiffness of left shoulder, not elsewhere classified  Stiffness of right shoulder, not elsewhere classified  Other symptoms and signs involving the musculoskeletal  system    Problem List Patient Active Problem List   Diagnosis Date Noted  . Bilateral  shoulder pain 08/14/2020  . Trigger finger, left little finger 06/09/2019  . Alteration in skin integrity due to moisture 03/30/2019  . Chronic right shoulder pain 07/05/2018  . Intermittent knee pain 05/28/2018  . Vitamin D deficiency 11/22/2015  . Back pain 09/15/2013  . Dermatomycosis 12/22/2012  . Carpal tunnel syndrome on left 07/31/2011  . Prediabetes 10/16/2007  . Hyperlipidemia LDL goal <100 10/16/2007  . Morbid obesity (HCC) 10/16/2007  . Anxiety and depression 10/16/2007  . Essential hypertension 10/16/2007  . Pulmonary hypertension (HCC) 10/16/2007  . GERD 10/16/2007  . DIVERTICULOSIS OF COLON 10/16/2007  . OSTEOARTHRITIS, KNEES, 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   Ezra Sites, OTR/L  [phone] 09/21/2020, 3:58 PM  [facility] [address] [place], [place], [address] Phone: [phone]   Fax:  [phone]  Name: BRITTLEY REGNER MRN: [id number] Date of Birth: Dec 31, 1945

## 2020-09-25 ENCOUNTER — Other Ambulatory Visit: Payer: Self-pay

## 2020-09-25 ENCOUNTER — Ambulatory Visit (HOSPITAL_COMMUNITY): Payer: Medicare Other

## 2020-09-25 DIAGNOSIS — G8929 Other chronic pain: Secondary | ICD-10-CM

## 2020-09-25 DIAGNOSIS — M25612 Stiffness of left shoulder, not elsewhere classified: Secondary | ICD-10-CM | POA: Diagnosis not present

## 2020-09-25 DIAGNOSIS — R29898 Other symptoms and signs involving the musculoskeletal system: Secondary | ICD-10-CM | POA: Diagnosis not present

## 2020-09-25 DIAGNOSIS — M25512 Pain in left shoulder: Secondary | ICD-10-CM

## 2020-09-25 DIAGNOSIS — M25611 Stiffness of right shoulder, not elsewhere classified: Secondary | ICD-10-CM

## 2020-09-25 DIAGNOSIS — M25511 Pain in right shoulder: Secondary | ICD-10-CM | POA: Diagnosis not present

## 2020-09-25 NOTE — Therapy (Signed)
[facility] [address] [place], [place], [address] Phone: [phone]   Fax:  [phone]  Occupational Therapy Treatment  Patient Details  Name: [name] MRN: [id number] Date of Birth: 1946/06/01 Referring Provider (OT): Dr. Fuller Canada   Encounter Date: 09/25/2020   OT End of Session - 09/25/20 1729    Visit Number 5    Number of Visits 8    Date for OT Re-Evaluation 10/04/20    Authorization Type [employer] Medicare, no visit limit or auth required    Progress Note Due on Visit 10    OT Start Time 1645    OT Stop Time 1727    OT Time Calculation (min) 42 min    Activity Tolerance Patient tolerated treatment well    Behavior During Therapy [facility] for tasks assessed/performed           Past Medical History:  Diagnosis Date  . Asthma   . Back pain   . Depression   . Diverticulosis of colon   . GERD (gastroesophageal reflux disease)   . Hyperlipidemia   . Hypertension   . Obesity   . Osteoarthrosis, unspecified whether generalized or localized, lower leg    bilateral knees   . Pulmonary hypertension (HCC)     Past Surgical History:  Procedure Laterality Date  . ABDOMINAL HYSTERECTOMY    . COLONOSCOPY  2005   Dr. Jerolyn Shin Smith:numerous large scattered diverticula  . COLONOSCOPY N/A 11/15/2013   Dr. Darrick Penna: moderate diverticula, small internal hemorrhoids, redudant colon. Next TCS in 2025 with overtube.   . ESOPHAGOGASTRODUODENOSCOPY (EGD) WITH ESOPHAGEAL DILATION N/A 11/15/2013   Dr. 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 moderate erosive gastritis, negative H.pylori  . TOTAL KNEE ARTHROPLASTY  06/01/04   left / Dr. Romeo Apple  . TOTAL KNEE ARTHROPLASTY  11/29/03   right / Dr. Romeo Apple  . TUBAL LIGATION      There were no vitals filed for this visit.   Subjective Assessment - 09/25/20 1650    Subjective  S: It's sore but I can tolerate it. I won't give it a number.    Currently in Pain? No/denies               [facility] OT Assessment - 09/25/20 1732      Assessment   Medical Diagnosis BUE Shoulder Pain      Precautions   Precautions None                    OT Treatments/Exercises (OP) - 09/25/20 1706      Exercises   Exercises Shoulder      Shoulder Exercises: Supine   Protraction PROM;5 reps;Strengthening;10 reps;Both    Protraction Weight (lbs) 1    Horizontal ABduction PROM;5 reps;Strengthening;10 reps;Both    Horizontal ABduction Weight (lbs) 1    External Rotation PROM;5 reps;Strengthening;10 reps;Both    Internal Rotation PROM;5 reps;Strengthening;10 reps;Both    Internal Rotation Weight (lbs) 1    Flexion PROM;5 reps;Strengthening;10 reps    Shoulder Flexion Weight (lbs) 1    ABduction PROM;5 reps;Strengthening;10 reps;Both    Shoulder ABduction Weight (lbs) 1      Shoulder Exercises: Standing   Protraction Strengthening;10 reps;Both    Horizontal ABduction AROM;10 reps;Both    External Rotation Strengthening;10 reps;Both    External Rotation Weight (lbs) 1    Internal Rotation Strengthening;10 reps;Both    Internal Rotation Weight (lbs) 1    Flexion Strengthening;10 reps;Both  Shoulder Flexion Weight (lbs) 1    ABduction Strengthening;10 reps;Both    Shoulder ABduction Weight (lbs) 1      Shoulder Exercises: ROM/Strengthening   UBE (Upper Arm Bike) Level 1, 2' forward 2' reverse, pace: 5.0      Manual Therapy   Manual Therapy Myofascial release    Manual therapy comments completed separately from therapeutic exercise    Myofascial Release myofascial release to right and left upper arm, trapezius, and scapular regions to decrease pain and fascial restrictions and improve joint ROM                  OT Education - 09/25/20 1728    Education Details Provided red theraband for scapular strengthening. Provided verbal and visual demonstration. Did not have patient return demonstration due to time constraint.    Person(s) Educated Patient     Methods Explanation;Demonstration;Handout;Verbal cues    Comprehension Verbalized understanding            OT Short Term Goals - 09/15/20 1006      OT SHORT TERM GOAL #1   Title Patient will be educated on HEP for improved scapular stability and ROM required for ADL completion.    Time 2    Period Weeks    Status On-going    Target Date 09/20/20      OT SHORT TERM GOAL #2   Title Patient will decrease pain and soreness in her shoulders from 6/10 to 4/10 or better when participating in water aerobics.    Time 2    Period Weeks    Status On-going             OT Long Term Goals - 09/15/20 1006      OT LONG TERM GOAL #1   Title Patient will complete daily tasks with BUE at highest level of independence possible.    Time 4    Period Weeks    Status On-going      OT LONG TERM GOAL #2   Title Patient will decrease pain and soreness to 3/10 or better in bilateral shoulders when reaching into overhead cabinets.    Time 4    Period Weeks    Status On-going      OT LONG TERM GOAL #3   Title Patient will improve BUE shoulder AROM by 25% for improved independence with tasks reaching overhead or behind her back.    Time 4    Period Weeks    Status On-going      OT LONG TERM GOAL #4   Title Patient will improve proximal shoulder strength from 4/5 to 5/5 for improved stability needed for reaching and lifting.    Time 4    Period Weeks    Status On-going                 Plan - 09/25/20 1729    Clinical Impression Statement A: miniaml fascial restrictions noted on right lateral upper arm. Completed manual techniques to address. No fascial restrictions noted on the LUE. No passive stretching completed as patient has full ROM. Progressed to 1# hand weight supine and standing for shoulder strengthening. Muscle fatigue noted during standing exercises and require frequeny VC for form and technique with reminder to take appropriate rest breaks. Provided red theraband and  scapular strengthening exercises for HEP.    Plan P: Complete myofascial release if needed. (right may need it. No restrictions noted on LUE last session.) No need for passive stretching. Continue  with shoulder strengthening while monitoring form and technique. Complete scapular strengthening with red band to assess proper form and technique.    OT Home Exercise Plan initial eval:  AA/ROM 3/1: A/ROM 3/7: Red band scapular strengthening.    Consulted and Agree with Plan of Care Patient           Patient will benefit from skilled therapeutic intervention in order to improve the following deficits and impairments:   Body Structure / Function / Physical Skills: ADL,UE functional use,Fascial restriction,IADL,Pain,Strength,ROM       Visit Diagnosis: Chronic right shoulder pain  Chronic left shoulder pain  Stiffness of left shoulder, not elsewhere classified  Stiffness of right shoulder, not elsewhere classified  Other symptoms and signs involving the musculoskeletal system    Problem List Patient Active Problem List   Diagnosis Date Noted  . Bilateral shoulder pain 08/14/2020  . Trigger finger, left little finger 06/09/2019  . Alteration in skin integrity due to moisture 03/30/2019  . Chronic right shoulder pain 07/05/2018  . Intermittent knee pain 05/28/2018  . Vitamin D deficiency 11/22/2015  . Back pain 09/15/2013  . Dermatomycosis 12/22/2012  . Carpal tunnel syndrome on left 07/31/2011  . Prediabetes 10/16/2007  . Hyperlipidemia LDL goal <100 10/16/2007  . Morbid obesity (HCC) 10/16/2007  . Anxiety and depression 10/16/2007  . Essential hypertension 10/16/2007  . Pulmonary hypertension (HCC) 10/16/2007  . GERD 10/16/2007  . DIVERTICULOSIS OF COLON 10/16/2007  . OSTEOARTHRITIS, KNEES, 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    Limmie Patricia, OTR/L,CBIS  [phone]  09/25/2020, 5:34 PM  [facility] [address] [place], [place],  [address] Phone: [phone]   Fax:  [phone]  Name: [name] MRN: [id number] Date of Birth: 28-Aug-1945

## 2020-09-25 NOTE — Patient Instructions (Signed)
1) (Home) Extension: Isometric / Bilateral Arm Retraction - Sitting   Facing anchor, hold hands and elbow at shoulder height, with elbow bent.  Pull arms back to squeeze shoulder blades together. Repeat 10-15 times. 1-2 times/day.   2) (Clinic) Extension / Flexion (Assist)   Face anchor, pull arms back, keeping elbow straight, and squeze shoulder blades together. Repeat 10-15 times. 1-2 times/day.   Copyright  VHI. All rights reserved.   3) (Home) Retraction: Row - Bilateral (Anchor)   Facing anchor, arms reaching forward, pull hands toward stomach, keeping elbows bent and at your sides and pinching shoulder blades together. Repeat 10-15 times. 1-2 times/day.   Copyright  VHI. All rights reserved.   

## 2020-09-27 ENCOUNTER — Encounter (HOSPITAL_COMMUNITY): Payer: Medicare Other | Admitting: Occupational Therapy

## 2020-09-28 ENCOUNTER — Other Ambulatory Visit: Payer: Self-pay

## 2020-09-28 ENCOUNTER — Encounter (HOSPITAL_COMMUNITY): Payer: Self-pay | Admitting: Occupational Therapy

## 2020-09-28 ENCOUNTER — Ambulatory Visit (HOSPITAL_COMMUNITY): Payer: Medicare Other | Admitting: Occupational Therapy

## 2020-09-28 DIAGNOSIS — M25511 Pain in right shoulder: Secondary | ICD-10-CM | POA: Diagnosis not present

## 2020-09-28 DIAGNOSIS — M25611 Stiffness of right shoulder, not elsewhere classified: Secondary | ICD-10-CM | POA: Diagnosis not present

## 2020-09-28 DIAGNOSIS — M25512 Pain in left shoulder: Secondary | ICD-10-CM | POA: Diagnosis not present

## 2020-09-28 DIAGNOSIS — R29898 Other symptoms and signs involving the musculoskeletal system: Secondary | ICD-10-CM | POA: Diagnosis not present

## 2020-09-28 DIAGNOSIS — G8929 Other chronic pain: Secondary | ICD-10-CM | POA: Diagnosis not present

## 2020-09-28 DIAGNOSIS — M25612 Stiffness of left shoulder, not elsewhere classified: Secondary | ICD-10-CM

## 2020-09-28 NOTE — Therapy (Signed)
[facility] [address] [place], [place], [address] Phone: [phone]   Fax:  [phone]  Occupational Therapy Treatment  Patient Details  Name: [name] MRN: [id number] Date of Birth: 01/29/1946 Referring Provider (OT): Dr. Fuller Canada   Encounter Date: 09/28/2020   OT End of Session - 09/28/20 1605    Visit Number 6    Number of Visits 8    Date for OT Re-Evaluation 10/04/20    Authorization Type [employer] Medicare, no visit limit or auth required    Progress Note Due on Visit 10    OT Start Time 1515    OT Stop Time 1558    OT Time Calculation (min) 43 min    Activity Tolerance Patient tolerated treatment well    Behavior During Therapy [facility] for tasks assessed/performed           Past Medical History:  Diagnosis Date  . Asthma   . Back pain   . Depression   . Diverticulosis of colon   . GERD (gastroesophageal reflux disease)   . Hyperlipidemia   . Hypertension   . Obesity   . Osteoarthrosis, unspecified whether generalized or localized, lower leg    bilateral knees   . Pulmonary hypertension (HCC)     Past Surgical History:  Procedure Laterality Date  . ABDOMINAL HYSTERECTOMY    . COLONOSCOPY  2005   Dr. Jerolyn Shin Smith:numerous large scattered diverticula  . COLONOSCOPY N/A 11/15/2013   Dr. Darrick Penna: moderate diverticula, small internal hemorrhoids, redudant colon. Next TCS in 2025 with overtube.   . ESOPHAGOGASTRODUODENOSCOPY (EGD) WITH ESOPHAGEAL DILATION N/A 11/15/2013   Dr. 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 moderate erosive gastritis, negative H.pylori  . TOTAL KNEE ARTHROPLASTY  06/01/04   left / Dr. Romeo Apple  . TOTAL KNEE ARTHROPLASTY  11/29/03   right / Dr. Romeo Apple  . TUBAL LIGATION      There were no vitals filed for this visit.   Subjective Assessment - 09/28/20 1511    Subjective  S: I do a lot of crochet and water arobics.    Currently in Pain? No/denies   Stiffness              [facility] OT Assessment - 09/28/20 1511      Assessment   Medical Diagnosis BUE Shoulder Pain    Referring Provider (OT) Dr. Fuller Canada      Precautions   Precautions None                    OT Treatments/Exercises (OP) - 09/28/20 0001      Exercises   Exercises Shoulder      Shoulder Exercises: Supine   Protraction Strengthening;10 reps;Both    Protraction Weight (lbs) 1    Horizontal ABduction PROM;5 reps;Strengthening;10 reps;Both    Horizontal ABduction Weight (lbs) 1    External Rotation PROM;5 reps;Strengthening;10 reps;Both    Internal Rotation PROM;5 reps;Strengthening;10 reps;Both    Internal Rotation Weight (lbs) 1    Flexion PROM;5 reps;Strengthening;10 reps    Shoulder Flexion Weight (lbs) 1    ABduction PROM;5 reps;Strengthening;10 reps;Both   Joint distraction during passive due to pain.   Shoulder ABduction Weight (lbs) 1      Shoulder Exercises: Standing   Protraction Strengthening;10 reps;Both;Theraband    Theraband Level (Shoulder Protraction) Level 2 (Red)   x10 red theraband   Protraction Weight (lbs) 1    Horizontal ABduction 10 reps;Both;Strengthening    Horizontal  ABduction Weight (lbs) 1    External Rotation Strengthening;10 reps;Both    External Rotation Weight (lbs) 1    Internal Rotation Strengthening;10 reps;Both    Internal Rotation Weight (lbs) 1    Flexion Strengthening;10 reps;Both    Shoulder Flexion Weight (lbs) 1    ABduction Strengthening;10 reps;Both    Shoulder ABduction Weight (lbs) 1    Extension Theraband;10 reps    Theraband Level (Shoulder Extension) Level 2 (Red)    Row Theraband;10 reps    Theraband Level (Shoulder Row) Level 2 (Red)    Retraction Theraband;10 reps    Theraband Level (Shoulder Retraction) Level 2 (Red)      Shoulder Exercises: ROM/Strengthening   UBE (Upper Arm Bike) Level 1, 2:30' forward 2:30' reverse, pace: 5.5      Manual Therapy   Manual Therapy Myofascial release    Manual  therapy comments completed separately from therapeutic exercise    Myofascial Release myofascial release to right and left upper arm, trapezius, and scapular regions to decrease pain and fascial restrictions and improve joint ROM   Primarily R UE.                 OT Education - 09/28/20 1601    Education Details Pt watched brief video and was modeled by OT how to complete self-massage with tennis ball.    Person(s) Educated Patient    Methods Explanation;Demonstration;Other (comment)   video   Comprehension Verbalized understanding            OT Short Term Goals - 09/15/20 1006      OT SHORT TERM GOAL #1   Title Patient will be educated on HEP for improved scapular stability and ROM required for ADL completion.    Time 2    Period Weeks    Status On-going    Target Date 09/20/20      OT SHORT TERM GOAL #2   Title Patient will decrease pain and soreness in her shoulders from 6/10 to 4/10 or better when participating in water aerobics.    Time 2    Period Weeks    Status On-going             OT Long Term Goals - 09/15/20 1006      OT LONG TERM GOAL #1   Title Patient will complete daily tasks with BUE at highest level of independence possible.    Time 4    Period Weeks    Status On-going      OT LONG TERM GOAL #2   Title Patient will decrease pain and soreness to 3/10 or better in bilateral shoulders when reaching into overhead cabinets.    Time 4    Period Weeks    Status On-going      OT LONG TERM GOAL #3   Title Patient will improve BUE shoulder AROM by 25% for improved independence with tasks reaching overhead or behind her back.    Time 4    Period Weeks    Status On-going      OT LONG TERM GOAL #4   Title Patient will improve proximal shoulder strength from 4/5 to 5/5 for improved stability needed for reaching and lifting.    Time 4    Period Weeks    Status On-going                 Plan - 09/28/20 1607    Clinical Impression  Statement A: Minimal fascial restrictions noted in R  scapular region. Pt provided video modeling and in person modeling of tennis ball self-massage for restriction in scauplar region. Patient continues to demonstrate full ROM wiht passive tasks and strengthening. Noted pain in R UE abduction which improved with joint distraction. Fatigued during last set up standing B UE strengthening. Vc needed for form and technique again this date. Pt tolerated UBE and red theraband exercises well this date.    Body Structure / Function / Physical Skills ADL;UE functional use;Fascial restriction;IADL;Pain;Strength;ROM    Plan P: Provide patient with paper handout on tennis ball self-massage and complete during session to target areas in scapular region specifically. Continue B UE strengthening. Discontinue P/ROM as previously stated.    OT Home Exercise Plan initial eval:  AA/ROM 3/1: A/ROM 3/7: Red band scapular strengthening.    Consulted and Agree with Plan of Care Patient           Patient will benefit from skilled therapeutic intervention in order to improve the following deficits and impairments:   Body Structure / Function / Physical Skills: ADL,UE functional use,Fascial restriction,IADL,Pain,Strength,ROM       Visit Diagnosis: Chronic right shoulder pain  Chronic left shoulder pain  Stiffness of left shoulder, not elsewhere classified  Stiffness of right shoulder, not elsewhere classified  Other symptoms and signs involving the musculoskeletal system    Problem List Patient Active Problem List   Diagnosis Date Noted  . Bilateral shoulder pain 08/14/2020  . Trigger finger, left little finger 06/09/2019  . Alteration in skin integrity due to moisture 03/30/2019  . Chronic right shoulder pain 07/05/2018  . Intermittent knee pain 05/28/2018  . Vitamin D deficiency 11/22/2015  . Back pain 09/15/2013  . Dermatomycosis 12/22/2012  . Carpal tunnel syndrome on left 07/31/2011  . Prediabetes  10/16/2007  . Hyperlipidemia LDL goal <100 10/16/2007  . Morbid obesity (HCC) 10/16/2007  . Anxiety and depression 10/16/2007  . Essential hypertension 10/16/2007  . Pulmonary hypertension (HCC) 10/16/2007  . GERD 10/16/2007  . DIVERTICULOSIS OF COLON 10/16/2007  . OSTEOARTHRITIS, KNEES, 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   Danie Chandler OT, MOT  Danie Chandler 09/28/2020, 4:18 PM  [place] [facility] [address] [place], [place], [address] Phone: [phone]   Fax:  [phone]  Name: [name] MRN: [id number] Date of Birth: 09/09/1945

## 2020-10-02 DIAGNOSIS — J984 Other disorders of lung: Secondary | ICD-10-CM | POA: Diagnosis not present

## 2020-10-03 ENCOUNTER — Other Ambulatory Visit: Payer: Self-pay

## 2020-10-03 ENCOUNTER — Ambulatory Visit (HOSPITAL_COMMUNITY): Payer: Medicare Other

## 2020-10-03 ENCOUNTER — Encounter (HOSPITAL_COMMUNITY): Payer: Self-pay

## 2020-10-03 DIAGNOSIS — M25511 Pain in right shoulder: Secondary | ICD-10-CM | POA: Diagnosis not present

## 2020-10-03 DIAGNOSIS — M25512 Pain in left shoulder: Secondary | ICD-10-CM | POA: Diagnosis not present

## 2020-10-03 DIAGNOSIS — M25611 Stiffness of right shoulder, not elsewhere classified: Secondary | ICD-10-CM | POA: Diagnosis not present

## 2020-10-03 DIAGNOSIS — M25612 Stiffness of left shoulder, not elsewhere classified: Secondary | ICD-10-CM | POA: Diagnosis not present

## 2020-10-03 DIAGNOSIS — G8929 Other chronic pain: Secondary | ICD-10-CM

## 2020-10-03 DIAGNOSIS — R29898 Other symptoms and signs involving the musculoskeletal system: Secondary | ICD-10-CM

## 2020-10-03 NOTE — Therapy (Signed)
[facility] [address] [place], [place], [address] Phone: [phone]   Fax:  [phone]  Occupational Therapy Treatment Reassessment and discharge summary Patient Details  Name: [name] MRN: [id number] Date of Birth: 10-Jul-1946 Referring Provider (OT): Dr. Arther Abbott  Overall AROM Comments assessed in supine then seated, external and internal rotation with shoulder adducted (assessed seated for the first time today)   AROM Assessment Site Shoulder   Right/Left Shoulder Left;Right   Right Shoulder Flexion 166 Degrees   previous: 150 (seated: 160)  Right Shoulder ABduction 180 Degrees   previous: 175 (seated: 180)  Right Shoulder Internal Rotation 90 Degrees   previous: same  Right Shoulder External Rotation 70 Degrees   previous: 0 (seated: 33)  Left Shoulder Flexion 175 Degrees   previous: 165 (seated: 170)  Left Shoulder ABduction 180 Degrees   previous: 160 (seated: 180)  Left Shoulder Internal Rotation 90 Degrees   previous: same  Left Shoulder External Rotation 90 Degrees   previous: 30 (seated: 45)    PROM  Overall PROM  Within functional limits for tasks performed   Overall PROM Comments BUE shoulder in all ranges.     Strength  Overall Strength Comments proximal shoulder stabilty is good - , strength assessed in seated   Strength Assessment Site Shoulder   Right/Left Shoulder Right;Left   Right Shoulder Flexion 5/5   Previous: 4-/5  Right Shoulder ABduction 5/5   previous: 4-/5  Right Shoulder Internal Rotation 5/5   previous: 4-/5  Right Shoulder External Rotation 5/5   previous: 4-/5  Left Shoulder Flexion 4/5   previous: 4-/5  Left Shoulder ABduction 4+/5   previous: 4-/5  Left Shoulder Internal Rotation 5/5   previous: 4-/5  Left Shoulder External Rotation 3+/5   previous: 4-/5    Encounter Date: 10/03/2020   OT End of Session - 10/03/20 1444    Visit Number 7    Number of Visits 8    Date for OT  Re-Evaluation 10/04/20    Authorization Type [employer] Medicare, no visit limit or auth required    Progress Note Due on Visit 10    OT Start Time 1300    OT Stop Time 1342    OT Time Calculation (min) 42 min    Activity Tolerance Patient tolerated treatment well    Behavior During Therapy WFL for tasks assessed/performed           Past Medical History:  Diagnosis Date  . Asthma   . Back pain   . Depression   . Diverticulosis of colon   . GERD (gastroesophageal reflux disease)   . Hyperlipidemia   . Hypertension   . Obesity   . Osteoarthrosis, unspecified whether generalized or localized, lower leg    bilateral knees   . Pulmonary hypertension ([place])     Past Surgical History:  Procedure Laterality Date  . ABDOMINAL HYSTERECTOMY    . COLONOSCOPY  2005   Dr. Leane Para Smith:numerous large scattered diverticula  . COLONOSCOPY N/A 11/15/2013   Dr. Oneida Alar: moderate diverticula, small internal hemorrhoids, redudant colon. Next TCS in 2025 with overtube.   . ESOPHAGOGASTRODUODENOSCOPY (EGD) WITH ESOPHAGEAL DILATION N/A 11/15/2013   Dr. Oneida Alar: stricture at [place] junction s/p dilation. moderate erosive gastritis, negative H.pylori  . TOTAL KNEE ARTHROPLASTY  06/01/04   left / Dr. Aline Brochure  . TOTAL KNEE ARTHROPLASTY  11/29/03   right / Dr. Aline Brochure  . TUBAL LIGATION      There  were no vitals filed for this visit.   Subjective Assessment - 10/03/20 1304    Subjective  S: It's a little sore but not enough for a number.    Currently in Pain? No/denies              [facility] OT Assessment - 10/03/20 1305      Assessment   Medical Diagnosis BUE Shoulder Pain      Precautions   Precautions None      Observation/Other Assessments   Focus on Therapeutic Outcomes (FOTO)  67/100      ROM / Strength   AROM / PROM / Strength AROM;PROM;Strength      AROM   Overall AROM Comments assessed in supine then seated, external and internal rotation with shoulder adducted (assessed seated for the  first time today)    AROM Assessment Site Shoulder    Right/Left Shoulder Left;Right    Right Shoulder Flexion 166 Degrees   previous: 150 (seated: 160)   Right Shoulder ABduction 180 Degrees   previous: 175 (seated: 180)   Right Shoulder Internal Rotation 90 Degrees   previous: same   Right Shoulder External Rotation 70 Degrees   previous: 0 (seated: 33)   Left Shoulder Flexion 175 Degrees   previous: 165 (seated: 170)   Left Shoulder ABduction 180 Degrees   previous: 160 (seated: 180)   Left Shoulder Internal Rotation 90 Degrees   previous: same   Left Shoulder External Rotation 90 Degrees   previous: 30 (seated: 45)     PROM   Overall PROM  Within functional limits for tasks performed    Overall PROM Comments BUE shoulder in all ranges.      Strength   Overall Strength Comments proximal shoulder stabilty is good - , strength assessed in seated    Strength Assessment Site Shoulder    Right/Left Shoulder Right;Left    Right Shoulder Flexion 5/5   Previous: 4-/5   Right Shoulder ABduction 5/5   previous: 4-/5   Right Shoulder Internal Rotation 5/5   previous: 4-/5   Right Shoulder External Rotation 5/5   previous: 4-/5   Left Shoulder Flexion 4/5   previous: 4-/5   Left Shoulder ABduction 4+/5   previous: 4-/5   Left Shoulder Internal Rotation 5/5   previous: 4-/5   Left Shoulder External Rotation 3+/5   previous: 4-/5                   OT Treatments/Exercises (OP) - 10/03/20 0001      ADLs   ADL Comments Reviewed side sleeping positioning using pillows to elevate and position her right arm to prevent horizontal adduction which causes discomfort. Pillow between legs was encouraged. Shown leg pillows on [employer] that may help. recommended purchasing 1-2 regular pillows and stacking them high enough to elevate arm.                  OT Education - 10/03/20 1355    Education Details reviewed therapy goals and progress in therapy. Reviewed HEP and recommended  continuing with red theraband exercises for scapular strengthening as well as continueing with water aerobics. Reviewed technique for self massage using tennis ball. Education on positioning while side sleeping.    Person(s) Educated Patient    Methods Explanation    Comprehension Verbalized understanding            OT Short Term Goals - 10/03/20 1327      OT SHORT TERM GOAL #1  Title Patient will be educated on HEP for improved scapular stability and ROM required for ADL completion.    Time 2    Period Weeks    Status Achieved    Target Date 09/20/20      OT SHORT TERM GOAL #2   Title Patient will decrease pain and soreness in her shoulders from 6/10 to 4/10 or better when participating in water aerobics.    Time 2    Period Weeks    Status Achieved             OT Long Term Goals - 10/03/20 1329      OT LONG TERM GOAL #1   Title Patient will complete daily tasks with BUE at highest level of independence possible.    Time 4    Period Weeks    Status Achieved      OT LONG TERM GOAL #2   Title Patient will decrease pain and soreness to 3/10 or better in bilateral shoulders when reaching into overhead cabinets.    Time 4    Period Weeks    Status Achieved      OT LONG TERM GOAL #3   Title Patient will improve BUE shoulder AROM by 25% for improved independence with tasks reaching overhead or behind her back.    Time 4    Period Weeks    Status Achieved      OT LONG TERM GOAL #4   Title Patient will improve proximal shoulder strength from 4/5 to 5/5 for improved stability needed for reaching and lifting.    Time 4    Period Weeks    Status Partially Met                 Plan - 10/03/20 1445    Clinical Impression Statement A: Reassessment completed this date. patient is able to demonstrate functional A/ROM and strength of BUE. Left shoulder is presenting with 4/5 or greater strength while RUE is 5/5. Patient reports difficulty with sleeping comfortably at  night on left side. Provided sleeping positioning recommendations to increase comfort level. HEP was reviewed. Recommended patient continues with scapular strengthening using red band and water aerobics. She reports that her granddaughter is able to help her use the tennis ball for self myofascial release and trigger point release as she is unable to complete on floor or against the wall.    Body Structure / Function / Physical Skills ADL;UE functional use;Fascial restriction;IADL;Pain;Strength;ROM    Plan P: Discharge from OT services with HEP.    Consulted and Agree with Plan of Care Patient           Patient will benefit from skilled therapeutic intervention in order to improve the following deficits and impairments:   Body Structure / Function / Physical Skills: ADL,UE functional use,Fascial restriction,IADL,Pain,Strength,ROM       Visit Diagnosis: Chronic right shoulder pain  Chronic left shoulder pain  Stiffness of left shoulder, not elsewhere classified  Stiffness of right shoulder, not elsewhere classified  Other symptoms and signs involving the musculoskeletal system    Problem List Patient Active Problem List   Diagnosis Date Noted  . Bilateral shoulder pain 08/14/2020  . Trigger finger, left little finger 06/09/2019  . Alteration in skin integrity due to moisture 03/30/2019  . Chronic right shoulder pain 07/05/2018  . Intermittent knee pain 05/28/2018  . Vitamin D deficiency 11/22/2015  . Back pain 09/15/2013  . Dermatomycosis 12/22/2012  . Carpal tunnel syndrome on left  07/31/2011  . Prediabetes 10/16/2007  . Hyperlipidemia LDL goal <100 10/16/2007  . Morbid obesity ([place]) 10/16/2007  . Anxiety and depression 10/16/2007  . Essential hypertension 10/16/2007  . Pulmonary hypertension ([place]) 10/16/2007  . GERD 10/16/2007  . DIVERTICULOSIS OF COLON 10/16/2007  . OSTEOARTHRITIS, KNEES, 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     OCCUPATIONAL THERAPY DISCHARGE SUMMARY  Visits  from Start of Care: 7  Current functional level related to goals / functional outcomes: Patient reports she is able to pick up more dishes at once when putting them away in the cabinet/shelf versus before therapy when she had to pick up fewer at a time. ROM and strength have all increased in BUE. No pain noted with functional use.    Remaining deficits: LUE demonstrates slightly decreased strength compared to RUE.    Education / Equipment: See above Plan: Patient agrees to discharge.  Patient goals were met. Patient is being discharged due to meeting the stated rehab goals.  ?????         Ailene Ravel, OTR/L,CBIS  [phone]  10/03/2020, 2:51 PM  [facility] [address] [place], [place], [address] Phone: [phone]   Fax:  [phone]  Name: [name] MRN: [id number] Date of Birth: [birth year]

## 2020-10-05 ENCOUNTER — Encounter (HOSPITAL_COMMUNITY): Payer: Medicare Other | Admitting: Specialist

## 2020-10-10 ENCOUNTER — Encounter (HOSPITAL_COMMUNITY): Payer: Medicare Other | Admitting: Occupational Therapy

## 2020-10-12 ENCOUNTER — Encounter (HOSPITAL_COMMUNITY): Payer: Medicare Other | Admitting: Occupational Therapy

## 2020-10-18 ENCOUNTER — Other Ambulatory Visit: Payer: Self-pay | Admitting: Family Medicine

## 2020-11-02 DIAGNOSIS — J984 Other disorders of lung: Secondary | ICD-10-CM | POA: Diagnosis not present

## 2020-11-21 ENCOUNTER — Telehealth: Payer: Self-pay

## 2020-11-21 NOTE — Telephone Encounter (Signed)
noted 

## 2020-11-21 NOTE — Telephone Encounter (Signed)
NP from [place] [facility], Verdie Drown  States the patient's a1c when checked at home was now 6.6. Last reading in Jan was 6.2. States she wanted to make [place] aware

## 2020-12-02 DIAGNOSIS — J984 Other disorders of lung: Secondary | ICD-10-CM | POA: Diagnosis not present

## 2020-12-12 ENCOUNTER — Encounter: Payer: Self-pay | Admitting: Family Medicine

## 2020-12-12 ENCOUNTER — Other Ambulatory Visit: Payer: Self-pay

## 2020-12-12 ENCOUNTER — Ambulatory Visit (INDEPENDENT_AMBULATORY_CARE_PROVIDER_SITE_OTHER): Payer: Medicare Other | Admitting: Family Medicine

## 2020-12-12 VITALS — BP 128/82 | HR 74 | Temp 97.0°F | Resp 16 | Ht 60.0 in | Wt 193.0 lb

## 2020-12-12 DIAGNOSIS — R7303 Prediabetes: Secondary | ICD-10-CM | POA: Diagnosis not present

## 2020-12-12 DIAGNOSIS — F419 Anxiety disorder, unspecified: Secondary | ICD-10-CM

## 2020-12-12 DIAGNOSIS — E559 Vitamin D deficiency, unspecified: Secondary | ICD-10-CM

## 2020-12-12 DIAGNOSIS — F32A Depression, unspecified: Secondary | ICD-10-CM

## 2020-12-12 DIAGNOSIS — I10 Essential (primary) hypertension: Secondary | ICD-10-CM | POA: Diagnosis not present

## 2020-12-12 DIAGNOSIS — E785 Hyperlipidemia, unspecified: Secondary | ICD-10-CM | POA: Diagnosis not present

## 2020-12-12 DIAGNOSIS — J029 Acute pharyngitis, unspecified: Secondary | ICD-10-CM | POA: Insufficient documentation

## 2020-12-12 DIAGNOSIS — Z1231 Encounter for screening mammogram for malignant neoplasm of breast: Secondary | ICD-10-CM | POA: Diagnosis not present

## 2020-12-12 DIAGNOSIS — I272 Pulmonary hypertension, unspecified: Secondary | ICD-10-CM | POA: Diagnosis not present

## 2020-12-12 MED ORDER — PREDNISONE 5 MG PO TABS: 5.0000 mg | ORAL_TABLET | Freq: Two times a day (BID) | ORAL | 0 refills | Status: AC

## 2020-12-12 MED ORDER — GABAPENTIN 300 MG PO CAPS: 300.0000 mg | ORAL_CAPSULE | Freq: Every day | ORAL | 1 refills | Status: DC

## 2020-12-12 NOTE — Patient Instructions (Addendum)
Annual exam with MD e Septemebr when due , call if you need me sooner  ViT D, HBA1C, chem 7 and EGFR today  Prednidone prescribed for 5 days for sore thoat. Please do not use voice too much, speak less for the next 5 days.  Please gargle with salt water 3 times daily for next 5 days. You do nOT have a bacterial infection causing your sore throat  Careful not to fallPlease schedule mammogram Oct 16 or after, [facility]    You are referred to [facility] for evaluation and treatment of pulmonary hypertension   Sore Throat When you have a sore throat, your throat may feel:  Tender.  Burning.  Irritated.  Scratchy.  Painful when you swallow.  Painful when you talk. Many things can cause a sore throat, such as:  An infection.  Allergies.  Dry air.  Smoke or pollution.  Radiation treatment.  Gastroesophageal reflux disease (GERD).  A tumor. A sore throat can be the first sign of another sickness. It can happen with other problems, like:  Coughing.  Sneezing.  Fever.  Swelling in the neck. Most sore throats go away without treatment. Follow these instructions at home:  Take over-the-counter medicines only as told by your doctor. ? If your child has a sore throat, do not give your child aspirin.  Drink enough fluids to keep your pee (urine) pale yellow.  Rest when you feel you need to.  To help with pain: ? Sip warm liquids, such as broth, herbal tea, or warm water. ? Eat or drink cold or frozen liquids, such as frozen ice pops. ? Gargle with a salt-water mixture 3-4 times a day or as needed. To make a salt-water mixture, add -1 tsp (3-6 g) of salt to 1 cup (237 mL) of warm water. Mix it until you cannot see the salt anymore. ? Suck on hard candy or throat lozenges. ? Put a cool-mist humidifier in your bedroom at night. ? Sit in the bathroom with the door closed for 5-10 minutes while you run hot water in the shower.  Do not use any products that contain nicotine  or tobacco, such as cigarettes, e-cigarettes, and chewing tobacco. If you need help quitting, ask your doctor.  Wash your hands well and often with soap and water. If soap and water are not available, use hand sanitizer.      Contact a doctor if:  You have a fever for more than 2-3 days.  You keep having symptoms for more than 2-3 days.  Your throat does not get better in 7 days.  You have a fever and your symptoms suddenly get worse.  Your child who is 3 months to 80 years old has a temperature of 102.13F (39C) or higher. Get help right away if:  You have trouble breathing.  You cannot swallow fluids, soft foods, or your saliva.  You have swelling in your throat or neck that gets worse.  You keep feeling sick to your stomach (nauseous).  You keep throwing up (vomiting). Summary  A sore throat is pain, burning, irritation, or scratchiness in the throat. Many things can cause a sore throat.  Take over-the-counter medicines only as told by your doctor. Do not give your child aspirin.  Drink plenty of fluids, and rest as needed.  Contact a doctor if your symptoms get worse or your sore throat does not get better within 7 days. This information is not intended to replace advice given to you by your health  care provider. Make sure you discuss any questions you have with your health care provider. Document Revised: 12/08/2017 Document Reviewed: 12/08/2017 Elsevier Patient Education  2021 [place].  Sore Throat When you have a sore throat, your throat may feel:  Tender.  Burning.  Irritated.  Scratchy.  Painful when you swallow.  Painful when you talk. Many things can cause a sore throat, such as:  An infection.  Allergies.  Dry air.  Smoke or pollution.  Radiation treatment.  Gastroesophageal reflux disease (GERD).  A tumor. A sore throat can be the first sign of another sickness. It can happen with other problems,  like:  Coughing.  Sneezing.  Fever.  Swelling in the neck. Most sore throats go away without treatment. Follow these instructions at home:  Take over-the-counter medicines only as told by your doctor. ? If your child has a sore throat, do not give your child aspirin.  Drink enough fluids to keep your pee (urine) pale yellow.  Rest when you feel you need to.  To help with pain: ? Sip warm liquids, such as broth, herbal tea, or warm water. ? Eat or drink cold or frozen liquids, such as frozen ice pops. ? Gargle with a salt-water mixture 3-4 times a day or as needed. To make a salt-water mixture, add -1 tsp (3-6 g) of salt to 1 cup (237 mL) of warm water. Mix it until you cannot see the salt anymore. ? Suck on hard candy or throat lozenges. ? Put a cool-mist humidifier in your bedroom at night. ? Sit in the bathroom with the door closed for 5-10 minutes while you run hot water in the shower.  Do not use any products that contain nicotine or tobacco, such as cigarettes, e-cigarettes, and chewing tobacco. If you need help quitting, ask your doctor.  Wash your hands well and often with soap and water. If soap and water are not available, use hand sanitizer.      Contact a doctor if:  You have a fever for more than 2-3 days.  You keep having symptoms for more than 2-3 days.  Your throat does not get better in 7 days.  You have a fever and your symptoms suddenly get worse.  Your child who is 3 months to 25 years old has a temperature of 102.48F (39C) or higher. Get help right away if:  You have trouble breathing.  You cannot swallow fluids, soft foods, or your saliva.  You have swelling in your throat or neck that gets worse.  You keep feeling sick to your stomach (nauseous).  You keep throwing up (vomiting). Summary  A sore throat is pain, burning, irritation, or scratchiness in the throat. Many things can cause a sore throat.  Take over-the-counter medicines only  as told by your doctor. Do not give your child aspirin.  Drink plenty of fluids, and rest as needed.  Contact a doctor if your symptoms get worse or your sore throat does not get better within 7 days. This information is not intended to replace advice given to you by your health care provider. Make sure you discuss any questions you have with your health care provider. Document Revised: 12/08/2017 Document Reviewed: 12/08/2017 Elsevier Patient Education  2021 [employer].

## 2020-12-12 NOTE — Assessment & Plan Note (Signed)
Patient educated about the importance of limiting  Carbohydrate intake , the need to commit to daily physical activity for a minimum of 30 minutes , and to commit weight loss. The fact that changes in all these areas will reduce or eliminate all together the development of diabetes is stressed.   Diabetic Labs Latest Ref Rng & Units 08/15/2020 04/13/2020 02/15/2020 04/14/2019 12/18/2018  HbA1c 4.8 - 5.6 % 6.2(H) - 6.0(H) - 5.8(H)  Microalbumin 0.00 - 1.89 mg/dL - - - - -  Micro/Creat Ratio 0.0 - 30.0 mg/g - - - - -  Chol 100 - 199 mg/dL 829 937 169 678(L) 381  HDL >39 mg/dL 73 82 68 66 64  Calc LDL 0 - 99 mg/dL 79 83 017(P) 102(H) 89  Triglycerides 0 - 149 mg/dL 58 63 63 67 77  Creatinine 0.57 - 1.00 mg/dL 8.52 - 7.78 2.42 3.53   BP/Weight 12/12/2020 08/21/2020 05/01/2020 04/13/2020 03/20/2020 02/16/2020 08/17/2019  Systolic BP 128 126 120 120 165 165 154  Diastolic BP 82 63 78 78 79 79 74  Wt. (Lbs) 193 192 194 198 194 194 195.04  BMI 37.69 37.5 37.89 38.67 37.89 37.89 38.09   Foot/eye exam completion dates 03/13/2010  Foot exam Order yes  Foot Form Completion -    Updated lab needed at/ before next visit.

## 2020-12-12 NOTE — Progress Notes (Signed)
[name]     MRN: [id number]      DOB: 1946-03-07   HPI [name] is here for follow up and re-evaluation of chronic medical conditions, medication management and review of any available recent lab and radiology data.  Preventive health is updated, specifically  Cancer screening and Immunization.   Questions or concerns regarding consultations or procedures which the PT has had in the interim are  addressed. The PT denies any adverse reactions to current medications since the last visit.  3 day h/o sore throat, no sinus pressure, fever, chills , ear pain or productive cough. Has loss of voice, painless, reports well controlled gERD symptoms Needs pulmonary evaluation re management of pulmonary  HTN  ROS Intermittent right rib pain x 2 weeks, mainly aggravated by movement. Denies  Palpitations, PND , orthopnea and leg swelling Denies abdominal pain, nausea, vomiting,diarrhea or constipation.   Denies dysuria, frequency, hesitancy or incontinence. C/o  joint pain, swelling and limitation in mobility. Denies headaches, seizures, numbness, or tingling. Denies uncontrolled depression, anxiety or insomnia. Denies skin break down or rash.   PE  BP 128/82   Pulse 74   Temp (!) 97 F (36.1 C) (Temporal)   Resp 16   Ht 5' (1.524 m)   Wt 193 lb (87.5 kg)   SpO2 96%   BMI 37.69 kg/m   Patient alert and oriented and in no cardiopulmonary distress.  HEENT: No facial asymmetry, EOMI,     Neck supple .Oropharynx pink  Chest: Clear to auscultation bilaterally.  CVS: S1, S2 no murmurs, no S3.Regular rate.  ABD: Soft non tender.   Ext: No edema  MS: decreased  ROM spine, shoulders, hips and knees.  Skin: Intact, no ulcerations or rash noted.  Psych: Good eye contact, normal affect. Memory intact not anxious or depressed appearing.  CNS: CN 2-12 intact, power,  normal throughout.no focal deficits noted.   Assessment & Plan  Pulmonary hypertension Diagnosed by Dr  Juanetta Gosling , in 2009, was maintained on oral med, needs re evaluation and management , refer Pulmonary  Essential hypertension Controlled, no change in medication DASH diet and commitment to daily physical activity for a minimum of 30 minutes discussed and encouraged, as a part of hypertension management. The importance of attaining a healthy weight is also discussed.  BP/Weight 12/12/2020 08/21/2020 05/01/2020 04/13/2020 03/20/2020 02/16/2020 08/17/2019  Systolic BP 128 126 120 120 165 165 154  Diastolic BP 82 63 78 78 79 79 74  Wt. (Lbs) 193 192 194 198 194 194 195.04  BMI 37.69 37.5 37.89 38.67 37.89 37.89 38.09       Prediabetes Patient educated about the importance of limiting  Carbohydrate intake , the need to commit to daily physical activity for a minimum of 30 minutes , and to commit weight loss. The fact that changes in all these areas will reduce or eliminate all together the development of diabetes is stressed.   Diabetic Labs Latest Ref Rng & Units 08/15/2020 04/13/2020 02/15/2020 04/14/2019 12/18/2018  HbA1c 4.8 - 5.6 % 6.2(H) - 6.0(H) - 5.8(H)  Microalbumin 0.00 - 1.89 mg/dL - - - - -  Micro/Creat Ratio 0.0 - 30.0 mg/g - - - - -  Chol 100 - 199 mg/dL 150 569 794 801(K) 553  HDL >39 mg/dL 73 82 68 66 64  Calc LDL 0 - 99 mg/dL 79 83 748(O) 707(E) 89  Triglycerides 0 - 149 mg/dL 58 63 63 67 77  Creatinine 0.57 - 1.00  mg/dL 1.01 - 7.51 0.25 8.52   BP/Weight 12/12/2020 08/21/2020 05/01/2020 04/13/2020 03/20/2020 02/16/2020 08/17/2019  Systolic BP 128 126 120 120 165 165 154  Diastolic BP 82 63 78 78 79 79 74  Wt. (Lbs) 193 192 194 198 194 194 195.04  BMI 37.69 37.5 37.89 38.67 37.89 37.89 38.09   Foot/eye exam completion dates 03/13/2010  Foot exam Order yes  Foot Form Completion -    Updated lab needed at/ before next visit.   Hyperlipidemia LDL goal <100 Hyperlipidemia:Low fat diet discussed and encouraged.   Lipid Panel  Lab Results  Component Value Date   CHOL 164  08/15/2020   HDL 73 08/15/2020   LDLCALC 79 08/15/2020   TRIG 58 08/15/2020   CHOLHDL 2.2 08/15/2020   Controlled, no change in medication Updated lab needed at/ before next visit.     Sore throat 3ay history, no sign of infection on exam. Short course of prednisone, voice rest and salt water gargles  Morbid obesity ([facility]) Obesity linked with hTN, depression, arthritis  Patient re-educated about  the importance of commitment to a  minimum of 150 minutes of exercise per week as able.  The importance of healthy food choices with portion control discussed, as well as eating regularly and within a 12 hour window most days. The need to choose "clean , green" food 50 to 75% of the time is discussed, as well as to make water the primary drink and set a goal of 64 ounces water daily.    Weight /BMI 12/12/2020 08/21/2020 05/01/2020  WEIGHT 193 lb 192 lb 194 lb  HEIGHT 5\' 0"  5\' 0"  5\' 0"   BMI 37.69 kg/m2 37.5 kg/m2 37.89 kg/m2      Anxiety and depression Controlled, no change in medication

## 2020-12-12 NOTE — Assessment & Plan Note (Signed)
Obesity linked with hTN, depression, arthritis  Patient re-educated about  the importance of commitment to a  minimum of 150 minutes of exercise per week as able.  The importance of healthy food choices with portion control discussed, as well as eating regularly and within a 12 hour window most days. The need to choose "clean , green" food 50 to 75% of the time is discussed, as well as to make water the primary drink and set a goal of 64 ounces water daily.    Weight /BMI 12/12/2020 08/21/2020 05/01/2020  WEIGHT 193 lb 192 lb 194 lb  HEIGHT 5\' 0"  5\' 0"  5\' 0"   BMI 37.69 kg/m2 37.5 kg/m2 37.89 kg/m2

## 2020-12-12 NOTE — Assessment & Plan Note (Signed)
Hyperlipidemia:Low fat diet discussed and encouraged.   Lipid Panel  Lab Results  Component Value Date   CHOL 164 08/15/2020   HDL 73 08/15/2020   LDLCALC 79 08/15/2020   TRIG 58 08/15/2020   CHOLHDL 2.2 08/15/2020   Controlled, no change in medication Updated lab needed at/ before next visit.

## 2020-12-12 NOTE — Assessment & Plan Note (Signed)
Diagnosed by Dr Juanetta Gosling , in 2009, was maintained on oral med, needs re evaluation and management , refer Pulmonary

## 2020-12-12 NOTE — Assessment & Plan Note (Signed)
Controlled, no change in medication DASH diet and commitment to daily physical activity for a minimum of 30 minutes discussed and encouraged, as a part of hypertension management. The importance of attaining a healthy weight is also discussed.  BP/Weight 12/12/2020 08/21/2020 05/01/2020 04/13/2020 03/20/2020 02/16/2020 08/17/2019  Systolic BP 128 126 120 120 165 165 154  Diastolic BP 82 63 78 78 79 79 74  Wt. (Lbs) 193 192 194 198 194 194 195.04  BMI 37.69 37.5 37.89 38.67 37.89 37.89 38.09

## 2020-12-12 NOTE — Assessment & Plan Note (Signed)
Controlled, no change in medication  

## 2020-12-12 NOTE — Assessment & Plan Note (Signed)
3ay history, no sign of infection on exam. Short course of prednisone, voice rest and salt water gargles

## 2020-12-13 LAB — BMP8+EGFR
BUN/Creatinine Ratio: 16 (ref 12–28)
BUN: 14 mg/dL (ref 8–27)
CO2: 25 mmol/L (ref 20–29)
Calcium: 9.9 mg/dL (ref 8.7–10.3)
Chloride: 101 mmol/L (ref 96–106)
Creatinine, Ser: 0.86 mg/dL (ref 0.57–1.00)
Glucose: 91 mg/dL (ref 65–99)
Potassium: 4 mmol/L (ref 3.5–5.2)
Sodium: 142 mmol/L (ref 134–144)
eGFR: 72 mL/min/{1.73_m2} (ref >59)

## 2020-12-13 LAB — HEMOGLOBIN A1C
Est. average glucose Bld gHb Est-mCnc: 131 mg/dL
Hgb A1c MFr Bld: 6.2 % — ABNORMAL HIGH (ref 4.8–5.6)

## 2020-12-13 LAB — VITAMIN D 25 HYDROXY (VIT D DEFICIENCY, FRACTURES): Vit D, 25-Hydroxy: 41.9 ng/mL (ref 30.0–100.0)

## 2020-12-19 ENCOUNTER — Telehealth: Payer: Self-pay

## 2020-12-19 ENCOUNTER — Other Ambulatory Visit: Payer: Self-pay | Admitting: Family Medicine

## 2020-12-19 NOTE — Telephone Encounter (Signed)
Pt notified of results

## 2020-12-19 NOTE — Telephone Encounter (Signed)
Call pt back regarding lab work

## 2020-12-20 ENCOUNTER — Other Ambulatory Visit: Payer: Self-pay | Admitting: Family Medicine

## 2021-01-02 DIAGNOSIS — J984 Other disorders of lung: Secondary | ICD-10-CM | POA: Diagnosis not present

## 2021-01-06 ENCOUNTER — Other Ambulatory Visit: Payer: Self-pay | Admitting: Family Medicine

## 2021-01-26 ENCOUNTER — Other Ambulatory Visit: Payer: Self-pay

## 2021-01-26 ENCOUNTER — Emergency Department (HOSPITAL_COMMUNITY)
Admission: EM | Admit: 2021-01-26 | Discharge: 2021-01-26 | Disposition: A | Discharge status: Home / Self Care | Payer: Medicare Other | Attending: Emergency Medicine | Admitting: Emergency Medicine

## 2021-01-26 ENCOUNTER — Encounter (HOSPITAL_COMMUNITY): Payer: Self-pay

## 2021-01-26 DIAGNOSIS — Z96653 Presence of artificial knee joint, bilateral: Secondary | ICD-10-CM | POA: Insufficient documentation

## 2021-01-26 DIAGNOSIS — Y92009 Unspecified place in unspecified non-institutional (private) residence as the place of occurrence of the external cause: Secondary | ICD-10-CM | POA: Diagnosis not present

## 2021-01-26 DIAGNOSIS — J45909 Unspecified asthma, uncomplicated: Secondary | ICD-10-CM | POA: Diagnosis not present

## 2021-01-26 DIAGNOSIS — W274XXA Contact with kitchen utensil, initial encounter: Secondary | ICD-10-CM | POA: Insufficient documentation

## 2021-01-26 DIAGNOSIS — Z7982 Long term (current) use of aspirin: Secondary | ICD-10-CM | POA: Insufficient documentation

## 2021-01-26 DIAGNOSIS — Z79899 Other long term (current) drug therapy: Secondary | ICD-10-CM | POA: Diagnosis not present

## 2021-01-26 DIAGNOSIS — I10 Essential (primary) hypertension: Secondary | ICD-10-CM | POA: Insufficient documentation

## 2021-01-26 DIAGNOSIS — S61212A Laceration without foreign body of right middle finger without damage to nail, initial encounter: Secondary | ICD-10-CM | POA: Insufficient documentation

## 2021-01-26 DIAGNOSIS — S6991XA Unspecified injury of right wrist, hand and finger(s), initial encounter: Secondary | ICD-10-CM | POA: Diagnosis present

## 2021-01-26 MED ORDER — BUPIVACAINE HCL (PF) 0.5 % IJ SOLN
10.0000 mL | Freq: Once | INTRAMUSCULAR | Status: AC
  Administered 2021-01-26: 10 mL
  Filled 2021-01-26: qty 30

## 2021-01-26 MED ORDER — SILVER NITRATE-POT NITRATE 75-25 % EX MISC
1.0000 "application " | Freq: Once | CUTANEOUS | Status: AC
  Administered 2021-01-26: 1 via TOPICAL
  Filled 2021-01-26: qty 10

## 2021-01-26 NOTE — ED Triage Notes (Signed)
Pt arrived from home via POV c/o laceration to right middle finger after cutting it on a grater in the kitchen at home. Bleeding looks to be controlled at this time.

## 2021-01-26 NOTE — ED Provider Notes (Signed)
[facility] EMERGENCY DEPARTMENT Provider Note   CSN: [id number] Arrival date & time: 01/26/21  2101     History Chief Complaint  Patient presents with   Finger Injury    [name] is a 75 y.o. female.  Pt presents to the ED today with an injury to her right middle finger.  Pt said she was using a grater and sliced the tip of her finger.  It bled a lot, so she came in.  No other injuries.  She is not on blood thinners.      Past Medical History:  Diagnosis Date   Asthma    Back pain    Depression    Diverticulosis of colon    GERD (gastroesophageal reflux disease)    Hyperlipidemia    Hypertension    Obesity    Osteoarthrosis, unspecified whether generalized or localized, lower leg    bilateral knees    Pulmonary hypertension (HCC)     Patient Active Problem List   Diagnosis Date Noted   Sore throat 12/12/2020   Bilateral shoulder pain 08/14/2020   Trigger finger, left little finger 06/09/2019   Alteration in skin integrity due to moisture 03/30/2019   Chronic right shoulder pain 07/05/2018   Intermittent knee pain 05/28/2018   Vitamin D deficiency 11/22/2015   Back pain 09/15/2013   Dermatomycosis 12/22/2012   Carpal tunnel syndrome on left 07/31/2011   Prediabetes 10/16/2007   Hyperlipidemia LDL goal <100 10/16/2007   Morbid obesity (HCC) 10/16/2007   Anxiety and depression 10/16/2007   Essential hypertension 10/16/2007   Pulmonary hypertension (HCC) 10/16/2007   GERD 10/16/2007   DIVERTICULOSIS OF COLON 10/16/2007   OSTEOARTHRITIS, KNEES, 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 Jerolyn Shin Smith:numerous large scattered diverticula   COLONOSCOPY N/A 11/15/2013   Dr. Darrick Penna: moderate diverticula, small internal hemorrhoids, redudant colon. Next TCS in 2025 with overtube.    ESOPHAGOGASTRODUODENOSCOPY (EGD) WITH ESOPHAGEAL DILATION N/A 11/15/2013   Dr. Darrick Penna:  stricture at GE junction s/p dilation. moderate erosive gastritis, negative H.pylori   TOTAL KNEE ARTHROPLASTY  06/01/04   left / Dr. Romeo Apple   TOTAL KNEE ARTHROPLASTY  11/29/03   right / Dr. Romeo Apple   TUBAL LIGATION       OB History     Gravida  2   Para  2   Term  2   Preterm      AB      Living         SAB      IAB      Ectopic      Multiple      Live Births              Family History  Problem Relation Age of Onset   Aneurysm Father    Diabetes Father    Diabetes Mother    Hypertension Mother    Heart disease Mother    Hypertension Brother    Diabetes Brother    Diabetes Brother    Hypertension Brother    Colon cancer Maternal Uncle    Hypertension Son    Breast cancer Maternal Aunt     Social History   Tobacco Use   Smoking status: Never   Smokeless tobacco: Never  Vaping Use   Vaping Use: Never used  Substance Use Topics   Alcohol use: No   Drug use: No  Home Medications Prior to Admission medications   Medication Sig Start Date End Date Taking? Authorizing Provider  alendronate (FOSAMAX) 70 MG tablet TAKE 1 TABLET BY MOUTH  WEEKLY WITH 8 OUNCE OF  PLAIN WATER 1/2 HOUR BEFORE FIRST FOOD DRINK OR MEDS.  STAY UPRIGHT FOR 1/2 HOUR 01/08/21   Kerri PerchesSimpson, Margaret E, MD  amLODipine (NORVASC) 10 MG tablet Take 1 tablet (10 mg total) by mouth daily. 04/21/19   Kerri PerchesSimpson, Margaret E, MD  amLODipine (NORVASC) 5 MG tablet TAKE 1 TABLET BY MOUTH AT  BEDTIME 12/20/20   Kerri PerchesSimpson, Margaret E, MD  aspirin 81 MG EC tablet Take 81 mg by mouth daily. Take one tablet by mouth once a day    [provider]  atorvastatin (LIPITOR) 20 MG tablet TAKE 1 TABLET BY MOUTH EVERY DAY 12/21/20   Kerri PerchesSimpson, Margaret E, MD  Calcium Carbonate-Vit D-Min (CALCIUM 600 + MINERALS) 600-200 MG-UNIT TABS Take 1 tablet by mouth 3 (three) times daily.     [provider]  clotrimazole-betamethasone (LOTRISONE) cream Apply cream twice daily to rash in groin for 10 days,  then as needed 04/13/20   Kerri PerchesSimpson, Margaret E, MD  gabapentin (NEURONTIN) 300 MG capsule Take 1 capsule (300 mg total) by mouth at bedtime. 12/12/20   Kerri PerchesSimpson, Margaret E, MD  Hydrocortisone (GERHARDT'S BUTT CREAM) CREA Apply 1 application topically 3 (three) times daily. 03/30/19   Freddy FinnerMills, Hannah M, NP  losartan-hydrochlorothiazide (HYZAAR) 100-12.5 MG tablet Take 1 tablet by mouth daily. 06/09/19   Kerri PerchesSimpson, Margaret E, MD  meloxicam (MOBIC) 15 MG tablet Take 1 tablet (15 mg total) by mouth daily. Patient not taking: Reported on 12/12/2020 08/14/20   Kerri PerchesSimpson, Margaret E, MD  metoprolol succinate (TOPROL-XL) 50 MG 24 hr tablet TAKE 1 TABLET BY MOUTH  DAILY WITH OR IMMEDIATELY  FOLLOWING A MEAL 04/17/20   Kerri PerchesSimpson, Margaret E, MD  nystatin (NYSTATIN) powder Apply 1 application topically 3 (three) times daily. 04/13/20   Kerri PerchesSimpson, Margaret E, MD  olopatadine (PATANOL) 0.1 % ophthalmic solution INSTILL 1 DROP INTO BOTH EYES TWICE A DAY 12/21/20   Kerri PerchesSimpson, Margaret E, MD  omeprazole (PRILOSEC) 20 MG capsule TAKE 1 CAPSULE BY MOUTH  DAILY 03/24/19   Kerri PerchesSimpson, Margaret E, MD  potassium chloride SA (KLOR-CON) 20 MEQ tablet Take 1 tablet (20 mEq total) by mouth daily. 08/16/20   Kerri PerchesSimpson, Margaret E, MD  PROAIR HFA 108 [id number](90 Base) MCG/ACT inhaler USE 2 PUFFS INTO LUNGS EVERY 6 HOURS AS NEEDED FOR WHEEZING OR SHORTNESS OF BREATH 01/23/17   Kerri PerchesSimpson, Margaret E, MD  pyridOXINE (VITAMIN B-6) 100 MG tablet Take 200 mg by mouth daily.    [provider]  sertraline (ZOLOFT) 25 MG tablet TAKE 1 TABLET BY MOUTH  DAILY 10/19/20   Kerri PerchesSimpson, Margaret E, MD  sildenafil (REVATIO) 20 MG tablet Take 60 mg by mouth daily.     [provider]    Allergies    Patient has no known allergies.  Review of Systems   Review of Systems  Skin:  Positive for wound.  All other systems reviewed and are negative.  Physical Exam Updated Vital Signs BP (!) 184/94 (BP Location: Right Arm)   Pulse 99   Temp 98.7 F (37.1 C) (Oral)    Resp 20   Ht 5' (1.524 m)   Wt 87.1 kg   SpO2 99%   BMI 37.50 kg/m   Physical Exam Vitals and nursing note reviewed.  Constitutional:      Appearance: Normal appearance.  HENT:     Head: Normocephalic and atraumatic.     Right Ear: External ear normal.     Left Ear: External ear normal.     Nose: Nose normal.     Mouth/Throat:     Mouth: Mucous membranes are moist.     Pharynx: Oropharynx is clear.  Eyes:     Extraocular Movements: Extraocular movements intact.     Conjunctiva/sclera: Conjunctivae normal.     Pupils: Pupils are equal, round, and reactive to light.  Cardiovascular:     Rate and Rhythm: Normal rate and regular rhythm.     Pulses: Normal pulses.     Heart sounds: Normal heart sounds.  Pulmonary:     Effort: Pulmonary effort is normal.     Breath sounds: Normal breath sounds.  Abdominal:     General: Abdomen is flat. Bowel sounds are normal.  Musculoskeletal:        General: Normal range of motion.     Cervical back: Normal range of motion and neck supple.  Skin:    General: Skin is warm.     Capillary Refill: Capillary refill takes less than 2 seconds.     Comments: Distal finger tip avulsion.  Active bleeding with small arteriole.  Neurological:     General: No focal deficit present.     Mental Status: She is alert and oriented to person, place, and time.  Psychiatric:        Mood and Affect: Mood normal.        Behavior: Behavior normal.    ED Results / Procedures / Treatments   Labs (all labs ordered are listed, but only abnormal results are displayed) Labs Reviewed - No data to display  EKG None  Radiology No results found.  Procedures .Marland KitchenLaceration Repair  Date/Time: 01/26/2021 10:35 PM Performed by: Jacalyn Lefevre, MD Authorized by: Jacalyn Lefevre, MD   Consent:    Consent obtained:  Verbal   Consent given by:  Patient Universal protocol:    Procedure explained and questions answered to patient or proxy's satisfaction: yes      Patient identity confirmed:  Verbally with patient Anesthesia:    Anesthesia method:  Nerve block   Block location:  Digital   Block needle gauge:  27 G   Block anesthetic:  Bupivacaine 0.5% w/o epi   Block technique:  Digital   Block outcome:  Anesthesia achieved Laceration details:    Location:  Finger   Finger location:  R long finger   Length (cm):  0.5 Exploration:    Hemostasis achieved with:  Cautery   Contaminated: no   Treatment:    Irrigation solution:  Sterile saline Repair type:    Repair type:  Simple Post-procedure details:    Dressing:  Non-adherent dressing   Procedure completion:  Tolerated well, no immediate complications Comments:     No stiches needed.  Bleeding stopped with cautery.   Medications Ordered in ED Medications  bupivacaine (MARCAINE) 0.5 % injection 10 mL (10 mLs Infiltration Given 01/26/21 2154)  silver nitrate applicators applicator 1 application (1 application Topical Given 01/26/21 2154)    ED Course  I have reviewed the triage vital signs and the nursing notes.  Pertinent labs & imaging results that were available during my care of the patient were reviewed by me and considered in my medical decision making (see chart for details).    MDM Rules/Calculators/A&P  Bleeding stopped with silver nitrate.  Dressing applied.  Pt is stable for d/c.  Return if worse. Final Clinical Impression(s) / ED Diagnoses Final diagnoses:  Laceration of right middle finger without damage to nail, foreign body presence unspecified, initial encounter    Rx / DC Orders ED Discharge Orders     None        Jacalyn Lefevre, MD 01/26/21 2237

## 2021-01-29 ENCOUNTER — Ambulatory Visit (HOSPITAL_COMMUNITY)
Admission: RE | Admit: 2021-01-29 | Discharge: 2021-01-29 | Disposition: A | Discharge status: Home / Self Care | Payer: Medicare Other | Source: Ambulatory Visit | Attending: Internal Medicine | Admitting: Internal Medicine

## 2021-01-29 ENCOUNTER — Other Ambulatory Visit: Payer: Self-pay

## 2021-01-29 ENCOUNTER — Ambulatory Visit (INDEPENDENT_AMBULATORY_CARE_PROVIDER_SITE_OTHER): Payer: Medicare Other | Admitting: Internal Medicine

## 2021-01-29 ENCOUNTER — Encounter: Payer: Self-pay | Admitting: Internal Medicine

## 2021-01-29 VITALS — BP 134/68 | HR 75 | Temp 97.8°F | Ht 60.0 in | Wt 197.0 lb

## 2021-01-29 DIAGNOSIS — R0600 Dyspnea, unspecified: Secondary | ICD-10-CM | POA: Insufficient documentation

## 2021-01-29 DIAGNOSIS — J9611 Chronic respiratory failure with hypoxia: Secondary | ICD-10-CM

## 2021-01-29 DIAGNOSIS — I272 Pulmonary hypertension, unspecified: Secondary | ICD-10-CM | POA: Diagnosis not present

## 2021-01-29 DIAGNOSIS — I10 Essential (primary) hypertension: Secondary | ICD-10-CM | POA: Diagnosis not present

## 2021-01-29 DIAGNOSIS — R0609 Other forms of dyspnea: Secondary | ICD-10-CM

## 2021-01-29 NOTE — Progress Notes (Signed)
[name], female    DOB: 06-Aug-1945   MRN: [id number]   Brief patient profile:  74 bf never smoker previously followed by Dr [name] 02/15/16  = 196   Pft report  05/2013  1. Spirometry shows a mild ventilatory defect without definite airflow     obstruction. 2. Lung volumes are mildly to moderately reduced. 3. DLCO is moderately reduced. 4. Airway resistance is slightly elevated. 5. There is improvement with inhaled bronchodilator, but it does not     reach the level of significance. 6. Arterial blood gas is normal.  PFTs 04/22/15 minimal curvature, no better p saba,  ERV  almost 0      History of Present Illness  01/29/2021  Pulmonary/ 1st office eval/ [name] / [place] [facility]  No chief complaint on file. 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 Mb is 100 ft no hills and has to stop at house  Cough: none  Sleep: flat bed and 2 pillows  SABA use: not sure it helps  Overt hb since starting fosamax, unclear how long she's been on it  02 not keeping up with it at all  Covid vax x 4   No obvious day to day or daytime variability or assoc excess/ purulent sputum or mucus plugs or hemoptysis or cp or chest tightness, subjective wheeze or overt sinus symptoms.   Sleeping  without nocturnal  or early am exacerbation  of respiratory  c/o's or need for noct saba. Also denies any obvious fluctuation of symptoms with weather or environmental changes or other aggravating or alleviating factors except as outlined above   No unusual exposure hx or h/o childhood pna/ asthma or knowledge of premature birth.  Current Allergies, Complete Past Medical History, Past Surgical History, Family History, and Social History were reviewed in [employer] record.  ROS  The following are not active complaints unless  bolded Hoarseness, sore throat, dysphagia, dental problems, itching, sneezing,  nasal congestion or discharge of excess mucus or purulent secretions, ear ache,   fever, chills, sweats, unintended wt loss or wt gain, classically pleuritic or exertional cp,  orthopnea pnd or arm/hand swelling  or leg swelling, presyncope, palpitations, abdominal pain, anorexia, nausea, vomiting, diarrhea  or change in bowel habits or change in bladder habits, change in stools or change in urine, dysuria, hematuria,  rash, arthralgias, visual complaints, headache, numbness, weakness or ataxia or problems with walking or coordination,  change in mood or  memory.           Past Medical History:  Diagnosis Date   Asthma    Back pain    Depression    Diverticulosis of colon    GERD (gastroesophageal reflux disease)    Hyperlipidemia    Hypertension    Obesity    Osteoarthrosis, unspecified whether generalized or localized, lower leg    bilateral knees    Pulmonary hypertension (HCC)     Outpatient Medications Prior to Visit  Medication Sig Dispense Refill   alendronate (FOSAMAX) 70 MG tablet TAKE 1 TABLET BY MOUTH  WEEKLY WITH 8 OUNCE OF  PLAIN WATER 1/2 HOUR BEFORE FIRST FOOD DRINK OR MEDS.  STAY UPRIGHT FOR 1/2 HOUR 12 tablet 3   amLODipine (NORVASC) 10 MG tablet Take 1 tablet (10  mg total) by mouth daily. 90 tablet 3   aspirin 81 MG EC tablet Take 81 mg by mouth daily. Take one tablet by mouth once a day     atorvastatin (LIPITOR) 20 MG tablet TAKE 1 TABLET BY MOUTH EVERY DAY 90 tablet 1   Cholecalciferol (DIALYVITE VITAMIN D 5000 PO) Take 1 capsule by mouth daily.     clotrimazole-betamethasone (LOTRISONE) cream Apply cream twice daily to rash in groin for 10 days, then as needed 45 g 1   Hydrocortisone (GERHARDT'S BUTT CREAM) CREA Apply 1 application topically 3 (three) times daily. 1 each 1   losartan-hydrochlorothiazide (HYZAAR) 100-12.5 MG tablet Take 1 tablet by mouth daily. 90 tablet 3   meloxicam  (MOBIC) 15 MG tablet Take 1 tablet (15 mg total) by mouth daily. 7 tablet 0   metoprolol succinate (TOPROL-XL) 50 MG 24 hr tablet TAKE 1 TABLET BY MOUTH  DAILY WITH OR IMMEDIATELY  FOLLOWING A MEAL 90 tablet 3   nystatin (NYSTATIN) powder Apply 1 application topically 3 (three) times daily. 60 g 0   olopatadine (PATANOL) 0.1 % ophthalmic solution INSTILL 1 DROP INTO BOTH EYES TWICE A DAY 5 mL 6   omeprazole (PRILOSEC) 20 MG capsule TAKE 1 CAPSULE BY MOUTH  DAILY 90 capsule 1   PROAIR HFA 108 (90 Base) MCG/ACT inhaler USE 2 PUFFS INTO LUNGS EVERY 6 HOURS AS NEEDED FOR WHEEZING OR SHORTNESS OF BREATH 8.5 Inhaler 0   pyridOXINE (VITAMIN B-6) 100 MG tablet Take 200 mg by mouth daily.     sertraline (ZOLOFT) 25 MG tablet TAKE 1 TABLET BY MOUTH  DAILY 90 tablet 3   amLODipine (NORVASC) 5 MG tablet TAKE 1 TABLET BY MOUTH AT  BEDTIME 90 tablet 3   sildenafil (REVATIO) 20 MG tablet Take 60 mg by mouth daily.  (Patient not taking: Reported on 01/29/2021)     Calcium Carbonate-Vit D-Min (CALCIUM 600 + MINERALS) 600-200 MG-UNIT TABS Take 1 tablet by mouth 3 (three) times daily.      gabapentin (NEURONTIN) 300 MG capsule Take 1 capsule (300 mg total) by mouth at bedtime. 90 capsule 1   potassium chloride SA (KLOR-CON) 20 MEQ tablet Take 1 tablet (20 mEq total) by mouth daily. 90 tablet 1   No facility-administered medications prior to visit.     Objective:     BP 134/68 (BP Location: Left Arm, Cuff Size: Normal)   Pulse 75   Temp 97.8 F (36.6 C) (Oral)   Ht 5' (1.524 m)   Wt 197 lb (89.4 kg)   SpO2 96% Comment: on RA  BMI 38.47 kg/m   SpO2: 96 % (on RA)  Obese amb bf nad   HEENT : pt wearing mask not removed for exam due to covid -19 concerns.    NECK :  without JVD/Nodes/TM/ nl carotid upstrokes bilaterally   LUNGS: no acc muscle use,  Nl contour chest which is clear to A and P bilaterally without cough on insp or exp maneuvers   CV:  RRR  no s3 or murmur  - slt  increase in P2, and  no edema   ABD:  obese soft and nontender with nl inspiratory excursion in the supine position. No bruits or organomegaly appreciated, bowel sounds nl  MS:  Nl gait/ ext warm without deformities, calf tenderness, cyanosis or clubbing No obvious joint restrictions   SKIN: warm and dry without lesions    NEURO:  alert, approp, nl sensorium with  no motor or cerebellar  deficits apparent.     CXR PA and Lateral:   01/29/2021 :    I personally reviewed images/ radiology impression as follows:    Decreased lung volumes - No acute changes, marked eventration of ant portion of R HD   Labs ordered/ reviewed:      Chemistry      Component Value Date/Time   NA 142 12/12/2020 1604   K 4.0 12/12/2020 1604   CL 101 12/12/2020 1604   CO2 25 12/12/2020 1604   BUN 14 12/12/2020 1604   CREATININE 0.86 12/12/2020 1604   CREATININE 0.78 02/15/2020 0848      Component Value Date/Time   CALCIUM 9.9 12/12/2020 1604   ALKPHOS 73 08/15/2020 0931   AST 17 08/15/2020 0931   ALT 10 08/15/2020 0931   BILITOT 0.9 08/15/2020 0931        Lab Results  Component Value Date   WBC 9.2 08/15/2020   HGB 12.9 08/15/2020   HCT 38.3 08/15/2020   MCV 89 08/15/2020   PLT 192 08/15/2020     No results found for: DDIMER    Lab Results  Component Value Date   TSH 1.920 04/13/2020            Assessment   No problem-specific Assessment & Plan notes found for this encounter.     Sandrea Hughs, MD 01/29/2021

## 2021-01-29 NOTE — Assessment & Plan Note (Addendum)
Placed on 02 by Dr Juanetta Gosling for noct use with restrictive changes on pfts and very low erv c/w effects of body habitus  -  01/29/2021   Walked RA  approx  400 ft  @ slow to moderate pace  stopped due to  Min sob/ sats 95%      - ONO on 2lpm 01/29/2021   For now rec continue 2lpm hs, no need for portable

## 2021-01-29 NOTE — Patient Instructions (Addendum)
We will walk you today to make sure you have enough oxygen when you walk  We will arrange for [place] 02 level while on 2lpm.   Once we're sure we've corrected problem, then we will repeat your echo looking at your heart function.    Please schedule a follow up visit in 3 months  with pfts on return - call sooner if needed

## 2021-01-29 NOTE — Assessment & Plan Note (Signed)
PFts 05/2013 and 04/2015 c/w restriction with very low ERV, min obstruction -  01/29/2021   Walked RA  approx  400 ft  @ slow to moderate pace  stopped due to  Min sob/ sats 94%       Symptoms are   disproportionate to objective findings and not clear to what extent this is actually a pulmonary  problem but pt does appear to have difficult to sort out respiratory symptoms of unknown origin for which  DDX  = almost all start with A and  include Adherence, Ace Inhibitors, Acid Reflux, Active Sinus Disease, Alpha 1 Antitripsin deficiency, Anxiety masquerading as Airways dz,  ABPA,  Allergy(esp in young), Aspiration (esp in elderly), Adverse effects of meds,  Active smoking or Vaping, A bunch of PE's/clot burden (a few small clots can't cause this syndrome unless there is already severe underlying pulm or vascular dz with poor reserve),  Anemia or thyroid disorder, plus two Bs  = Bronchiectasis and Beta blocker use..and one C= CHF   ? Acid (or non-acid) GERD > always difficult to exclude as up to 75% of pts in some series report no assoc GI/ Heartburn symptoms and she is on fosamax > continue PPI daily ac   ? Allergy / asthma > no noct symptoms and not present on 2 PFTs 2 years apart  ? Anxiety/depression/ deconditioning  > usually at the bottom of this list of usual suspects but should be much higher on this pt's based on H and P and note already on psychotropics and may interfere with adherence and also interpretation of response or lack thereof to symptom management which can be quite subjective.    ? Adverse drug effects  > other than fosamax, none of the other usual list of suspects is a concern but no need to stop fosamax for now.

## 2021-01-30 ENCOUNTER — Encounter: Payer: Self-pay | Admitting: Internal Medicine

## 2021-01-30 ENCOUNTER — Other Ambulatory Visit: Payer: Self-pay | Admitting: Internal Medicine

## 2021-01-30 DIAGNOSIS — R0609 Other forms of dyspnea: Secondary | ICD-10-CM

## 2021-01-30 DIAGNOSIS — R0600 Dyspnea, unspecified: Secondary | ICD-10-CM

## 2021-01-30 NOTE — Assessment & Plan Note (Signed)
ERV of nearly 0 recorded in 04/2015 @ wt =  199 c/w effects of obesity on lung volumes   Note also she has R HD eventration which accentuates effects of obesity on lung volumes  Body mass index is 38.47 kg/m.  = 197 Lab Results  Component Value Date   TSH 1.920 04/13/2020     Contributing to gerd risk/ doe/reviewed the need and the process to achieve and maintain neg calorie balance > defer f/u primary care including intermittently monitoring thyroid status     Each maintenance medication was reviewed in detail including emphasizing most importantly the difference between maintenance and prns and under what circumstances the prns are to be triggered using an action plan format where appropriate.  Total time for H and P, chart review, counseling,  directly observing portions of ambulatory 02 saturation study/ and generating customized AVS unique to this initial office visit / same day charting = 49 min

## 2021-01-30 NOTE — Assessment & Plan Note (Signed)
Echo  01/29/13  - Left ventricle: Mild to moderate concentric hypertrophy.  Systolic function was normal. The estimated ejection  fraction was in the range of 55% to 60% with G 1 diastolic dysfunction   - Right ventricle: The cavity size was mildly dilated. RV  systolic pressure: 53mm Hg (S, est). However, the envelope  was inadequate to accurately assess this.  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  Will start w/u with a repeat echo p assuring first that she is on appropriate amt of noct 02 but at this point does not need daytime 02.  Discussed in detail all the  indications, usual  risks and alternatives  relative to the benefits with patient who agrees to proceed with Rx as outlined.

## 2021-01-31 ENCOUNTER — Encounter: Payer: Self-pay | Admitting: *Deleted

## 2021-02-01 DIAGNOSIS — J984 Other disorders of lung: Secondary | ICD-10-CM | POA: Diagnosis not present

## 2021-03-04 DIAGNOSIS — J984 Other disorders of lung: Secondary | ICD-10-CM | POA: Diagnosis not present

## 2021-03-08 ENCOUNTER — Other Ambulatory Visit: Payer: Self-pay | Admitting: Family Medicine

## 2021-03-23 ENCOUNTER — Other Ambulatory Visit (HOSPITAL_COMMUNITY): Payer: Medicare Other

## 2021-03-27 ENCOUNTER — Ambulatory Visit (HOSPITAL_COMMUNITY): Payer: Medicare Other

## 2021-04-04 DIAGNOSIS — J984 Other disorders of lung: Secondary | ICD-10-CM | POA: Diagnosis not present

## 2021-04-17 ENCOUNTER — Encounter: Payer: Medicare Other | Admitting: Family Medicine

## 2021-05-02 ENCOUNTER — Other Ambulatory Visit: Payer: Self-pay

## 2021-05-02 ENCOUNTER — Ambulatory Visit (INDEPENDENT_AMBULATORY_CARE_PROVIDER_SITE_OTHER): Payer: Medicare Other

## 2021-05-02 VITALS — Ht 60.0 in | Wt 197.0 lb

## 2021-05-02 DIAGNOSIS — Z780 Asymptomatic menopausal state: Secondary | ICD-10-CM | POA: Diagnosis not present

## 2021-05-02 DIAGNOSIS — Z0000 Encounter for general adult medical examination without abnormal findings: Secondary | ICD-10-CM | POA: Diagnosis not present

## 2021-05-02 DIAGNOSIS — Z1231 Encounter for screening mammogram for malignant neoplasm of breast: Secondary | ICD-10-CM | POA: Diagnosis not present

## 2021-05-02 DIAGNOSIS — Z0100 Encounter for examination of eyes and vision without abnormal findings: Secondary | ICD-10-CM

## 2021-05-02 NOTE — Progress Notes (Signed)
Subjective:   [name] is a 75 y.o. female who presents for Medicare Annual (Subsequent) preventive examination. Virtual Visit via Telephone Note  I connected with  [name] on 05/02/21 at  3:40 PM EDT by telephone and verified that I am speaking with the correct person using two identifiers.  Location: Patient: HOME  Provider: [facility] Persons participating in the virtual visit: patient/Nurse Health Advisor   I discussed the limitations, risks, security and privacy concerns of performing an evaluation and management service by telephone and the availability of in person appointments. The patient expressed understanding and agreed to proceed.  Interactive audio and video telecommunications were attempted between this nurse and patient, however failed, due to patient having technical difficulties OR patient did not have access to video capability.  We continued and completed visit with audio only.  Some vital signs may be absent or patient reported.   Darral Dash, LPN  Review of Systems     Cardiac Risk Factors include: advanced age (>48men, >8 women);hypertension;dyslipidemia;obesity (BMI >30kg/m2);sedentary lifestyle     Objective:    Today's Vitals   05/02/21 1618 05/02/21 1619  Weight: 197 lb (89.4 kg)   Height: 5' (1.524 m)   PainSc:  6    Body mass index is 38.47 kg/m.  Advanced Directives 05/02/2021 01/26/2021 09/06/2020 05/01/2020 03/19/2019 08/28/2018 08/25/2018  Does Patient Have a Medical Advance Directive? No No No No No No No  Type of Advance Directive - - - - - - -  Does patient want to make changes to medical advance directive? No - Patient declined - - - - - -  Copy of Midwife in Chart? - - - - - - -  Would patient like information on creating a medical advance directive? - No - Patient declined Yes (MAU/Ambulatory/Procedural Areas - Information given) No - Patient declined No - Patient declined No - Patient declined -   Pre-existing out of facility DNR order (yellow form or pink MOST form) - - - - - - -    Current Medications (verified) Outpatient Encounter Medications as of 05/02/2021  Medication Sig   alendronate (FOSAMAX) 70 MG tablet TAKE 1 TABLET BY MOUTH  WEEKLY WITH 8 OUNCE OF  PLAIN WATER 1/2 HOUR BEFORE FIRST FOOD DRINK OR MEDS.  STAY UPRIGHT FOR 1/2 HOUR   amLODipine (NORVASC) 10 MG tablet Take 1 tablet (10 mg total) by mouth daily.   amLODipine (NORVASC) 5 MG tablet TAKE 1 TABLET BY MOUTH AT  BEDTIME   aspirin 81 MG EC tablet Take 81 mg by mouth daily. Take one tablet by mouth once a day   atorvastatin (LIPITOR) 20 MG tablet TAKE 1 TABLET BY MOUTH EVERY DAY   Cholecalciferol (DIALYVITE VITAMIN D 5000 PO) Take 1 capsule by mouth daily.   clotrimazole-betamethasone (LOTRISONE) cream Apply cream twice daily to rash in groin for 10 days, then as needed   Hydrocortisone (GERHARDT'S BUTT CREAM) CREA Apply 1 application topically 3 (three) times daily.   losartan-hydrochlorothiazide (HYZAAR) 100-12.5 MG tablet Take 1 tablet by mouth daily.   meloxicam (MOBIC) 15 MG tablet Take 1 tablet (15 mg total) by mouth daily.   metoprolol succinate (TOPROL-XL) 50 MG 24 hr tablet TAKE 1 TABLET BY MOUTH  DAILY WITH OR IMMEDIATELY  FOLLOWING A MEAL   MODERNA COVID-19 VACCINE 100 MCG/0.5ML injection    nystatin (NYSTATIN) powder Apply 1 application topically 3 (three) times daily.   olopatadine (PATANOL) 0.1 % ophthalmic  solution INSTILL 1 DROP INTO BOTH EYES TWICE A DAY   omeprazole (PRILOSEC) 20 MG capsule TAKE 1 CAPSULE BY MOUTH  DAILY   PROAIR HFA 108 (90 Base) MCG/ACT inhaler USE 2 PUFFS INTO LUNGS EVERY 6 HOURS AS NEEDED FOR WHEEZING OR SHORTNESS OF BREATH   pyridOXINE (VITAMIN B-6) 100 MG tablet Take 200 mg by mouth daily.   sertraline (ZOLOFT) 25 MG tablet TAKE 1 TABLET BY MOUTH  DAILY   sildenafil (REVATIO) 20 MG tablet Take 60 mg by mouth daily.   No facility-administered encounter medications on file as  of 05/02/2021.    Allergies (verified) Patient has no known allergies.   History: Past Medical History:  Diagnosis Date   Asthma    Back pain    Depression    Diverticulosis of colon    GERD (gastroesophageal reflux disease)    Hyperlipidemia    Hypertension    Obesity    Osteoarthrosis, unspecified whether generalized or localized, lower leg    bilateral knees    Pulmonary hypertension (HCC)    Past Surgical History:  Procedure Laterality Date   ABDOMINAL HYSTERECTOMY     COLONOSCOPY  2005   Dr. Jerolyn Shin Smith:numerous large scattered diverticula   COLONOSCOPY N/A 11/15/2013   Dr. Darrick Penna: moderate diverticula, small internal hemorrhoids, redudant colon. Next TCS in 2025 with overtube.    ESOPHAGOGASTRODUODENOSCOPY (EGD) WITH ESOPHAGEAL DILATION N/A 11/15/2013   Dr. 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 moderate erosive gastritis, negative H.pylori   TOTAL KNEE ARTHROPLASTY  06/01/04   left / Dr. Romeo Apple   TOTAL KNEE ARTHROPLASTY  11/29/03   right / Dr. Romeo Apple   TUBAL LIGATION     Family History  Problem Relation Age of Onset   Diabetes Mother    Hypertension Mother    Heart disease Mother    Aneurysm Father    Diabetes Father    Hypertension Brother    Diabetes Brother    Diabetes Brother    Hypertension Brother    Hypertension Son    Breast cancer Maternal Aunt    Colon cancer Maternal Uncle    Social History   Socioeconomic History   Marital status: Married    Spouse name: Not on file   Number of children: 2   Years of education: 12   Highest education level: Not on file  Occupational History   Occupation: retired     Associate Professor: UNEMPLOYED  Tobacco Use   Smoking status: Never   Smokeless tobacco: Never  Vaping Use   Vaping Use: Never used  Substance and Sexual Activity   Alcohol use: No   Drug use: No   Sexual activity: Never    Birth control/protection: Surgical  Other Topics Concern   Not on file  Social History Narrative   Not on  file   Social Determinants of Health   Financial Resource Strain: Low Risk    Difficulty of Paying Living Expenses: Not hard at all  Food Insecurity: No Food Insecurity   Worried About Programme researcher, broadcasting/film/video in the Last Year: Never true   Ran Out of Food in the Last Year: Never true  Transportation Needs: No Transportation Needs   Lack of Transportation (Medical): No   Lack of Transportation (Non-Medical): No  Physical Activity: Sufficiently Active   Days of Exercise per Week: 3 days   Minutes of Exercise per Session: 60 min  Stress: No Stress Concern Present   Feeling of Stress : Not at all  Social Connections: Press photographer of Communication with Friends and Family: More than three times a week   Frequency of Social Gatherings with Friends and Family: More than three times a week   Attends Religious Services: More than 4 times per year   Active Member of [employer] or Organizations: Yes   Attends Engineer, structural: More than 4 times per year   Marital Status: Married    Tobacco Counseling Counseling given: Not Answered   Clinical Intake:  Pre-visit preparation completed: Yes  Pain : 0-10 Pain Score: 6  Pain Type: Chronic pain Pain Location: Back Pain Descriptors / Indicators: Aching Pain Onset: More than a month ago Pain Frequency: Intermittent     BMI - recorded: 38.47 Nutritional Status: BMI > 30  Obese Nutritional Risks: None Diabetes: No  How often do you need to have someone help you when you read instructions, pamphlets, or other written materials from your doctor or pharmacy?: 1 - Never  Diabetic?no  Interpreter Needed?: No  Information entered by :: MJ Kiely Cousar, LPN   Activities of Daily Living In your present state of health, do you have any difficulty performing the following activities: 05/02/2021  Hearing? N  Vision? N  Difficulty concentrating or making decisions? N  Walking or climbing stairs? N  Dressing or bathing? N   Doing errands, shopping? N  Preparing Food and eating ? N  Using the Toilet? N  In the past six months, have you accidently leaked urine? N  Do you have problems with loss of bowel control? N  Managing your Medications? N  Managing your Finances? N  Housekeeping or managing your Housekeeping? N  Some recent data might be hidden    Patient Care Team: Kerri Perches, MD as PCP - [facility], Darleene Cleaver, MD (Inactive) as Consulting Physician (Gastroenterology)  Indicate any recent Medical Services you may have received from other than [facility] providers in the past year (date may be approximate).     Assessment:   This is a routine wellness examination for [name].  Hearing/Vision screen Hearing Screening - Comments:: No hearing issues. Vision Screening - Comments:: Readers. Overdue. Would like referral for eye exam.   Dietary issues and exercise activities discussed: Current Exercise Habits: Structured exercise class, Type of exercise: Other - see comments (Water aerobics), Time (Minutes): 60, Frequency (Times/Week): 3, Weekly Exercise (Minutes/Week): 180, Intensity: Mild, Exercise limited by: cardiac condition(s);orthopedic condition(s)   Goals Addressed             This Visit's Progress    Increase water intake   On track    Recommend increasing your water intake to 64 ounces a day.      Weight (lb) < 200 lb (90.7 kg)   197 lb (89.4 kg)    Wants to lose 20lbs. Doing Water Aerobics 3x per week.       Depression Screen PHQ 2/9 Scores 05/02/2021 08/14/2020 05/01/2020 05/01/2020 02/16/2020 08/17/2019 06/09/2019  PHQ - 2 Score 0 1 0 0 0 0 0  PHQ- 9 Score - - - - 1 - 2    Fall Risk Fall Risk  05/02/2021 12/12/2020 08/14/2020 08/14/2020 05/01/2020  Falls in the past year? 0 0 0 0 0  Number falls in past yr: 0 0 0 0 0  Injury with Fall? 0 0 0 0 0  Risk for fall due to : Impaired balance/gait - - - No Fall Risks  Follow up Falls prevention discussed - - -  Falls evaluation  completed    FALL RISK PREVENTION PERTAINING TO THE HOME:  Any stairs in or around the home? Yes  If so, are there any without handrails? No  Home free of loose throw rugs in walkways, pet beds, electrical cords, etc? Yes  Adequate lighting in your home to reduce risk of falls? Yes   ASSISTIVE DEVICES UTILIZED TO PREVENT FALLS:  Life alert? No  Use of a cane, walker or w/c? No  Grab bars in the bathroom? Yes  Shower chair or bench in shower? Yes  Elevated toilet seat or a handicapped toilet? Yes   TIMED UP AND GO:  Was the test performed? No . Phone visit.  Cognitive Function:     6CIT Screen 05/02/2021 05/01/2020 05/01/2020 03/19/2019 03/16/2018  What Year? 0 points 0 points 0 points 0 points 0 points  What month? 0 points 0 points 0 points 0 points 0 points  What time? 0 points 0 points 0 points 0 points 0 points  Count back from 20 0 points 0 points - 0 points 0 points  Months in reverse 2 points 0 points - 2 points 0 points  Repeat phrase 2 points 0 points - 2 points 2 points  Total Score 4 0 - 4 2    Immunizations Immunization History  Administered Date(s) Administered   Fluad Quad(high Dose 65+) 04/21/2019, 04/13/2020   H1N1 05/23/2008   Influenza Split 04/02/2012, 04/21/2014, 03/05/2016   Influenza Whole 04/08/2007, 05/01/2009, 04/03/2010   Influenza,inj,Quad PF,6+ Mos 03/30/2015, 03/12/2017, 03/16/2018   Moderna Sars-Covid-2 Vaccination 10/25/2019, 11/22/2019, 06/05/2020   Pneumococcal Conjugate-13 08/04/2014   Pneumococcal Polysaccharide-23 01/16/2004, 06/12/2010, 09/14/2015   Td 01/16/2004   Zoster Recombinat (Shingrix) 05/05/2017, 04/20/2018   Zoster, Live 07/20/2008    TDAP status: Due, Education has been provided regarding the importance of this vaccine. Advised may receive this vaccine at local pharmacy or Health Dept. Aware to provide a copy of the vaccination record if obtained from local pharmacy or Health Dept. Verbalized acceptance and  understanding.  Flu Vaccine status: Due, Education has been provided regarding the importance of this vaccine. Advised may receive this vaccine at local pharmacy or Health Dept. Aware to provide a copy of the vaccination record if obtained from local pharmacy or Health Dept. Verbalized acceptance and understanding.  Pneumococcal vaccine status: Up to date  Covid-19 vaccine status: Completed vaccines  Qualifies for Shingles Vaccine? Yes   Zostavax completed Yes   Shingrix Completed?: Yes  Screening Tests Health Maintenance  Topic Date Due   COVID-19 Vaccine (4 - Booster for Moderna series) 10/03/2020   INFLUENZA VACCINE  02/19/2021   TETANUS/TDAP  01/31/2022 (Originally 01/15/2014)   MAMMOGRAM  05/05/2022   COLONOSCOPY (Pts 45-63yrs Insurance coverage will need to be confirmed)  11/17/2023   DEXA SCAN  Completed   Hepatitis C Screening  Completed   Zoster Vaccines- Shingrix  Completed   HPV VACCINES  Aged Out    Health Maintenance  Health Maintenance Due  Topic Date Due   COVID-19 Vaccine (4 - Booster for Moderna series) 10/03/2020   INFLUENZA VACCINE  02/19/2021    Colorectal cancer screening: Type of screening: Colonoscopy. Completed 11/16/2013. Repeat every 10 years  Mammogram status: Completed 05/05/2020. Repeat every year  Bone Density status: Completed 04/16/2018. Results reflect: Bone density results: OSTEOPENIA. Repeat every 2 years.  Lung Cancer Screening: (Low Dose CT Chest recommended if Age 24-80 years, 30 pack-year currently smoking OR have quit w/in 15years.) does not qualify.  Additional Screening:  Hepatitis C Screening: does qualify; Completed 05/25/2015  Vision Screening: Recommended annual ophthalmology exams for early detection of glaucoma and other disorders of the eye. Is the patient up to date with their annual eye exam?  No  Who is the provider or what is the name of the office in which the patient attends annual eye exams? none If pt is not  established with a provider, would they like to be referred to a provider to establish care? Yes .   Dental Screening: Recommended annual dental exams for proper oral hygiene  Community Resource Referral / Chronic Care Management: CRR required this visit?  No   CCM required this visit?  No      Plan:     I have personally reviewed and noted the following in the patient's chart:   Medical and social history Use of alcohol, tobacco or illicit drugs  Current medications and supplements including opioid prescriptions.  Functional ability and status Nutritional status Physical activity Advanced directives List of other physicians Hospitalizations, surgeries, and ER visits in previous 12 months Vitals Screenings to include cognitive, depression, and falls Referrals and appointments  In addition, I have reviewed and discussed with patient certain preventive protocols, quality metrics, and best practice recommendations. A written personalized care plan for preventive services as well as general preventive health recommendations were provided to patient.     Darral Dash, LPN   28/31/5176   Nurse Notes: Doing well. Would like referral for Mammogram, Bone density and eye exam sent. Referrals sent at pt request.

## 2021-05-02 NOTE — Patient Instructions (Signed)
Ms. Colasanti , Thank you for taking time to come for your Medicare Wellness Visit. I appreciate your ongoing commitment to your health goals. Please review the following plan we discussed and let me know if I can assist you in the future.   Screening recommendations/referrals: Colonoscopy: Done 11/16/2013 Repeat in 10 years  Mammogram: Done 05/05/2020 Repeat annually  Bone Density: Done 04/16/2018 Repeat every 2 years  Recommended yearly ophthalmology/optometry visit for glaucoma screening and checkup Recommended yearly dental visit for hygiene and checkup  Vaccinations: Influenza vaccine: Done 04/13/2020 Repeat annually  Pneumococcal vaccine: Done 08/04/2014 and 09/14/2015 Tdap vaccine: Done 01/16/2004 Repeat in 10 years  Shingles vaccine: Done 07/20/2008, 05/05/2017 and 04/20/2018 Covid-19:Done 10/25/19, 11/22/19, and 06/05/20  Advanced directives: Advance directive discussed with you today. Even though you declined this today, please call our office should you change your mind, and we can give you the proper paperwork for you to fill out.   Conditions/risks identified: Aim for 30 minutes of exercise or brisk walking each day, drink 6-8 glasses of water and eat lots of fruits and vegetables.   Next appointment: Follow up in one year for your annual wellness visit 2023   Preventive Care 65 Years and Older, Female Preventive care refers to lifestyle choices and visits with your health care provider that can promote health and wellness. What does preventive care include? A yearly physical exam. This is also called an annual well check. Dental exams once or twice a year. Routine eye exams. Ask your health care provider how often you should have your eyes checked. Personal lifestyle choices, including: Daily care of your teeth and gums. Regular physical activity. Eating a healthy diet. Avoiding tobacco and drug use. Limiting alcohol use. Practicing safe sex. Taking low-dose aspirin every  day. Taking vitamin and mineral supplements as recommended by your health care provider. What happens during an annual well check? The services and screenings done by your health care provider during your annual well check will depend on your age, overall health, lifestyle risk factors, and family history of disease. Counseling  Your health care provider may ask you questions about your: Alcohol use. Tobacco use. Drug use. Emotional well-being. Home and relationship well-being. Sexual activity. Eating habits. History of falls. Memory and ability to understand (cognition). Work and work Astronomer. Reproductive health. Screening  You may have the following tests or measurements: Height, weight, and BMI. Blood pressure. Lipid and cholesterol levels. These may be checked every 5 years, or more frequently if you are over 19 years old. Skin check. Lung cancer screening. You may have this screening every year starting at age 58 if you have a 30-pack-year history of smoking and currently smoke or have quit within the past 15 years. Fecal occult blood test (FOBT) of the stool. You may have this test every year starting at age 80. Flexible sigmoidoscopy or colonoscopy. You may have a sigmoidoscopy every 5 years or a colonoscopy every 10 years starting at age 27. Hepatitis C blood test. Hepatitis B blood test. Sexually transmitted disease (STD) testing. Diabetes screening. This is done by checking your blood sugar (glucose) after you have not eaten for a while (fasting). You may have this done every 1-3 years. Bone density scan. This is done to screen for osteoporosis. You may have this done starting at age 58. Mammogram. This may be done every 1-2 years. Talk to your health care provider about how often you should have regular mammograms. Talk with your health care provider about your  test results, treatment options, and if necessary, the need for more tests. Vaccines  Your health care  provider may recommend certain vaccines, such as: Influenza vaccine. This is recommended every year. Tetanus, diphtheria, and acellular pertussis (Tdap, Td) vaccine. You may need a Td booster every 10 years. Zoster vaccine. You may need this after age 70. Pneumococcal 13-valent conjugate (PCV13) vaccine. One dose is recommended after age 67. Pneumococcal polysaccharide (PPSV23) vaccine. One dose is recommended after age 73. Talk to your health care provider about which screenings and vaccines you need and how often you need them. This information is not intended to replace advice given to you by your health care provider. Make sure you discuss any questions you have with your health care provider. Document Released: 08/04/2015 Document Revised: 03/27/2016 Document Reviewed: 05/09/2015 Elsevier Interactive Patient Education  2017 [employer] Prevention in the Home Falls can cause injuries. They can happen to people of all ages. There are many things you can do to make your home safe and to help prevent falls. What can I do on the outside of my home? Regularly fix the edges of walkways and driveways and fix any cracks. Remove anything that might make you trip as you walk through a door, such as a raised step or threshold. Trim any bushes or trees on the path to your home. Use bright outdoor lighting. Clear any walking paths of anything that might make someone trip, such as rocks or tools. Regularly check to see if handrails are loose or broken. Make sure that both sides of any steps have handrails. Any raised decks and porches should have guardrails on the edges. Have any leaves, snow, or ice cleared regularly. Use sand or salt on walking paths during winter. Clean up any spills in your garage right away. This includes oil or grease spills. What can I do in the bathroom? Use night lights. Install grab bars by the toilet and in the tub and shower. Do not use towel bars as grab  bars. Use non-skid mats or decals in the tub or shower. If you need to sit down in the shower, use a plastic, non-slip stool. Keep the floor dry. Clean up any water that spills on the floor as soon as it happens. Remove soap buildup in the tub or shower regularly. Attach bath mats securely with double-sided non-slip rug tape. Do not have throw rugs and other things on the floor that can make you trip. What can I do in the bedroom? Use night lights. Make sure that you have a light by your bed that is easy to reach. Do not use any sheets or blankets that are too big for your bed. They should not hang down onto the floor. Have a firm chair that has side arms. You can use this for support while you get dressed. Do not have throw rugs and other things on the floor that can make you trip. What can I do in the kitchen? Clean up any spills right away. Avoid walking on wet floors. Keep items that you use a lot in easy-to-reach places. If you need to reach something above you, use a strong step stool that has a grab bar. Keep electrical cords out of the way. Do not use floor polish or wax that makes floors slippery. If you must use wax, use non-skid floor wax. Do not have throw rugs and other things on the floor that can make you trip. What can I do with my  stairs? Do not leave any items on the stairs. Make sure that there are handrails on both sides of the stairs and use them. Fix handrails that are broken or loose. Make sure that handrails are as long as the stairways. Check any carpeting to make sure that it is firmly attached to the stairs. Fix any carpet that is loose or worn. Avoid having throw rugs at the top or bottom of the stairs. If you do have throw rugs, attach them to the floor with carpet tape. Make sure that you have a light switch at the top of the stairs and the bottom of the stairs. If you do not have them, ask someone to add them for you. What else can I do to help prevent  falls? Wear shoes that: Do not have high heels. Have rubber bottoms. Are comfortable and fit you well. Are closed at the toe. Do not wear sandals. If you use a stepladder: Make sure that it is fully opened. Do not climb a closed stepladder. Make sure that both sides of the stepladder are locked into place. Ask someone to hold it for you, if possible. Clearly mark and make sure that you can see: Any grab bars or handrails. First and last steps. Where the edge of each step is. Use tools that help you move around (mobility aids) if they are needed. These include: Canes. Walkers. Scooters. Crutches. Turn on the lights when you go into a dark area. Replace any light bulbs as soon as they burn out. Set up your furniture so you have a clear path. Avoid moving your furniture around. If any of your floors are uneven, fix them. If there are any pets around you, be aware of where they are. Review your medicines with your doctor. Some medicines can make you feel dizzy. This can increase your chance of falling. Ask your doctor what other things that you can do to help prevent falls. This information is not intended to replace advice given to you by your health care provider. Make sure you discuss any questions you have with your health care provider. Document Released: 05/04/2009 Document Revised: 12/14/2015 Document Reviewed: 08/12/2014 Elsevier Interactive Patient Education  2017 [employer].

## 2021-05-04 ENCOUNTER — Other Ambulatory Visit (HOSPITAL_COMMUNITY)
Admission: RE | Admit: 2021-05-04 | Discharge: 2021-05-04 | Disposition: A | Discharge status: Home / Self Care | Payer: Medicare Other | Source: Ambulatory Visit | Attending: Internal Medicine | Admitting: Internal Medicine

## 2021-05-04 DIAGNOSIS — Z20822 Contact with and (suspected) exposure to covid-19: Secondary | ICD-10-CM | POA: Insufficient documentation

## 2021-05-04 DIAGNOSIS — J984 Other disorders of lung: Secondary | ICD-10-CM | POA: Diagnosis not present

## 2021-05-04 DIAGNOSIS — Z01812 Encounter for preprocedural laboratory examination: Secondary | ICD-10-CM | POA: Diagnosis not present

## 2021-05-07 ENCOUNTER — Other Ambulatory Visit (HOSPITAL_COMMUNITY)
Admission: RE | Admit: 2021-05-07 | Discharge: 2021-05-07 | Disposition: A | Discharge status: Home / Self Care | Payer: Medicare Other | Source: Ambulatory Visit | Attending: Internal Medicine | Admitting: Internal Medicine

## 2021-05-07 ENCOUNTER — Other Ambulatory Visit: Payer: Self-pay

## 2021-05-07 LAB — SARS CORONAVIRUS 2 (TAT 6-24 HRS): SARS Coronavirus 2: NEGATIVE

## 2021-05-08 ENCOUNTER — Ambulatory Visit (HOSPITAL_COMMUNITY)
Admission: RE | Admit: 2021-05-08 | Discharge: 2021-05-08 | Disposition: A | Discharge status: Home / Self Care | Payer: Medicare Other | Source: Ambulatory Visit | Attending: Internal Medicine | Admitting: Internal Medicine

## 2021-05-08 DIAGNOSIS — R0609 Other forms of dyspnea: Secondary | ICD-10-CM | POA: Diagnosis not present

## 2021-05-08 LAB — PULMONARY FUNCTION TEST
DL/VA % pred: 93 %
DL/VA: 3.95 ml/min/mmHg/L
DLCO unc % pred: 41 %
DLCO unc: 7.02 ml/min/mmHg
FEF 25-75 Post: 1.85 L/sec
FEF 25-75 Pre: 1.94 L/sec
FEF2575-%Change-Post: -4 %
FEF2575-%Pred-Post: 142 %
FEF2575-%Pred-Pre: 148 %
FEV1-%Change-Post: 0 %
FEV1-%Pred-Post: 89 %
FEV1-%Pred-Pre: 90 %
FEV1-Post: 1.27 L
FEV1-Pre: 1.27 L
FEV1FVC-%Change-Post: 0 %
FEV1FVC-%Pred-Pre: 115 %
FEV6-%Change-Post: -1 %
FEV6-%Pred-Post: 81 %
FEV6-%Pred-Pre: 82 %
FEV6-Post: 1.42 L
FEV6-Pre: 1.44 L
FEV6FVC-%Pred-Post: 105 %
FEV6FVC-%Pred-Pre: 105 %
FVC-%Change-Post: -1 %
FVC-%Pred-Post: 77 %
FVC-%Pred-Pre: 78 %
FVC-Post: 1.42 L
FVC-Pre: 1.44 L
Post FEV1/FVC ratio: 89 %
Post FEV6/FVC ratio: 100 %
Pre FEV1/FVC ratio: 88 %
Pre FEV6/FVC Ratio: 100 %
RV % pred: 81 %
RV: 1.67 L
TLC % pred: 69 %
TLC: 3.07 L

## 2021-05-08 MED ORDER — ALBUTEROL SULFATE (2.5 MG/3ML) 0.083% IN NEBU
2.5000 mg | INHALATION_SOLUTION | Freq: Once | RESPIRATORY_TRACT | Status: AC
  Administered 2021-05-08: 2.5 mg via RESPIRATORY_TRACT

## 2021-05-09 ENCOUNTER — Other Ambulatory Visit: Payer: Self-pay | Admitting: Family Medicine

## 2021-05-11 ENCOUNTER — Ambulatory Visit (HOSPITAL_COMMUNITY): Payer: Medicare Other

## 2021-05-16 ENCOUNTER — Telehealth: Payer: Self-pay | Admitting: Internal Medicine

## 2021-05-17 NOTE — Telephone Encounter (Signed)
Call patient :  Study is c/w low lung volumes due to wt,no change in recs - Be sure patient has/keeps f/u ov so we can go over all the details of this study and get a plan together moving forward - ok to move up f/u if not feeling better and wants to be seen sooner  Patient is aware of results and voiced her understanding.  Pending ov for 06/12/2021. Nothing further needed at this time.

## 2021-06-04 DIAGNOSIS — J984 Other disorders of lung: Secondary | ICD-10-CM | POA: Diagnosis not present

## 2021-06-12 ENCOUNTER — Other Ambulatory Visit: Payer: Self-pay

## 2021-06-12 ENCOUNTER — Encounter: Payer: Self-pay | Admitting: Internal Medicine

## 2021-06-12 ENCOUNTER — Ambulatory Visit: Payer: Medicare Other | Admitting: Internal Medicine

## 2021-06-12 DIAGNOSIS — R058 Other specified cough: Secondary | ICD-10-CM | POA: Diagnosis not present

## 2021-06-12 DIAGNOSIS — I272 Pulmonary hypertension, unspecified: Secondary | ICD-10-CM | POA: Diagnosis not present

## 2021-06-12 DIAGNOSIS — J9611 Chronic respiratory failure with hypoxia: Secondary | ICD-10-CM | POA: Diagnosis not present

## 2021-06-12 NOTE — Progress Notes (Signed)
[name], female    DOB: 06/30/1946   MRN: [id number]   Brief patient profile:  75 bf never smoker previously followed by Dr [name] 02/15/16  = 196   Pft report  05/2013  1. Spirometry shows a mild ventilatory defect without definite airflow     obstruction. 2. Lung volumes are mildly to moderately reduced. 3. DLCO is moderately reduced. 4. Airway resistance is slightly elevated. 5. There is improvement with inhaled bronchodilator, but it does not     reach the level of significance. 6. Arterial blood gas is normal.  PFTs 04/22/15 minimal curvature, no better p saba,  ERV  almost 0      History of Present Illness  01/29/2021  Pulmonary/ 1st office eval/ [name] / [place] [facility]  No chief complaint on file. 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 Mb is 100 ft no hills and has to stop at house  Cough: none  Sleep: flat bed and 2 pillows  SABA use: not sure it helps  Overt hb since starting fosamax, unclear how long she's been on it  02 not keeping up with it at all  Covid vax x 4   Rec We will walk you today to make sure you have enough oxygen when you walk We will arrange for [place] 02 level while on 2lpm. Once we're sure we've corrected problem, then we will repeat your echo looking at your heart function.  Please schedule a follow up visit in 3 months  with pfts on return - call sooner if needed     06/12/2021  f/u ov/[place] office/[name] re: MO/doe/ hypoxemia/ PH maint on ravatio but not seeing cards   Chief Complaint  Patient presents with   Follow-up    SOB on exertion is about the same as last OV.   Dyspnea:  doing water aerobics Cough:  2-3 times a week a week wakes up with cough /  Sleeping: ok on 2 pillows  SABA use: none needed  02: 2lpm hs only  Covid status: vax x 4 Lung cancer screening:  never smoker    No obvious day to day or daytime variability or assoc excess/ purulent sputum or mucus plugs or hemoptysis or cp or chest tightness, subjective wheeze or overt sinus or hb symptoms.     Also denies any obvious fluctuation of symptoms with weather or environmental changes or other aggravating or alleviating factors except as outlined above   No unusual exposure hx or h/o childhood pna/ asthma or knowledge of premature birth.  Current Allergies, Complete Past Medical History, Past Surgical History, Family History, and Social History were reviewed in [employer] record.  ROS  The following are not active complaints unless bolded Hoarseness, sore throat, dysphagia, dental problems, itching, sneezing,  nasal congestion or discharge of excess mucus or purulent secretions, ear ache,   fever, chills, sweats, unintended wt loss or wt gain, classically pleuritic or exertional cp,  orthopnea pnd or arm/hand swelling  or leg swelling, presyncope, palpitations, abdominal pain, anorexia, nausea, vomiting, diarrhea  or change in bowel habits or change in bladder habits, change in stools or change in urine, dysuria, hematuria,  rash, arthralgias, visual complaints, headache, numbness, weakness or ataxia or problems with walking or coordination,  change in  mood or  memory.        Current Meds -   - NOTE:   Unable to verify as accurately reflecting what pt takes    Medication Sig   alendronate (FOSAMAX) 70 MG tablet TAKE 1 TABLET BY MOUTH  WEEKLY WITH 8 OUNCE OF  PLAIN WATER 1/2 HOUR BEFORE FIRST FOOD DRINK OR MEDS.  STAY UPRIGHT FOR 1/2 HOUR   amLODipine (NORVASC) 10 MG tablet Take 1 tablet (10 mg total) by mouth daily.   amLODipine (NORVASC) 5 MG tablet TAKE 1 TABLET BY MOUTH AT  BEDTIME   aspirin 81 MG EC tablet Take 81 mg by mouth daily. Take one tablet by mouth once a day   atorvastatin (LIPITOR) 20 MG tablet TAKE 1 TABLET BY MOUTH EVERY DAY   Cholecalciferol  (DIALYVITE VITAMIN D 5000 PO) Take 1 capsule by mouth daily.   clotrimazole-betamethasone (LOTRISONE) cream Apply cream twice daily to rash in groin for 10 days, then as needed   gabapentin (NEURONTIN) 300 MG capsule TAKE 1 CAPSULE BY MOUTH AT  BEDTIME   Hydrocortisone (GERHARDT'S BUTT CREAM) CREA Apply 1 application topically 3 (three) times daily.   losartan-hydrochlorothiazide (HYZAAR) 100-12.5 MG tablet Take 1 tablet by mouth daily.   meloxicam (MOBIC) 15 MG tablet Take 1 tablet (15 mg total) by mouth daily.   metoprolol succinate (TOPROL-XL) 50 MG 24 hr tablet TAKE 1 TABLET BY MOUTH  DAILY WITH OR IMMEDIATELY  FOLLOWING A MEAL   MODERNA COVID-19 VACCINE 100 MCG/0.5ML injection    nystatin (NYSTATIN) powder Apply 1 application topically 3 (three) times daily.   olopatadine (PATANOL) 0.1 % ophthalmic solution INSTILL 1 DROP INTO BOTH EYES TWICE A DAY   omeprazole (PRILOSEC) 20 MG capsule TAKE 1 CAPSULE BY MOUTH  DAILY   potassium chloride SA (KLOR-CON) 20 MEQ tablet TAKE 1 TABLET BY MOUTH  DAILY   PROAIR HFA 108 (90 Base) MCG/ACT inhaler USE 2 PUFFS INTO LUNGS EVERY 6 HOURS AS NEEDED FOR WHEEZING OR SHORTNESS OF BREATH   pyridOXINE (VITAMIN B-6) 100 MG tablet Take 200 mg by mouth daily.   sertraline (ZOLOFT) 25 MG tablet TAKE 1 TABLET BY MOUTH  DAILY   sildenafil (REVATIO) 20 MG tablet Take 60 mg by mouth daily.        Past Medical History:  Diagnosis Date   Asthma    Back pain    Depression    Diverticulosis of colon    GERD (gastroesophageal reflux disease)    Hyperlipidemia    Hypertension    Obesity    Osteoarthrosis, unspecified whether generalized or localized, lower leg    bilateral knees    Pulmonary hypertension (HCC)      Objective:     Wt Readings from Last 3 Encounters:  06/12/21 199 lb (90.3 kg)  05/02/21 197 lb (89.4 kg)  01/29/21 197 lb (89.4 kg)      Vital signs reviewed  06/12/2021  - Note at rest 02 sats  97% on RA   General appearance:    amb bf nad     HEENT : pt wearing mask not removed for exam due to covid -19 concerns.    NECK :  without JVD/Nodes/TM/ nl carotid upstrokes bilaterally   LUNGS: no acc muscle use,  Nl contour chest which is clear to A and P bilaterally without cough on insp or exp maneuvers   CV:  RRR  no s3 or murmur - slt increase in P2, and no edema  ABD:  soft and nontender with nl inspiratory excursion in the supine position. No bruits or organomegaly appreciated, bowel sounds nl  MS:  Nl gait/ ext warm without deformities, calf tenderness, cyanosis or clubbing No obvious joint restrictions   SKIN: warm and dry without lesions    NEURO:  alert, approp, nl sensorium with  no motor or cerebellar deficits apparent.             Assessment

## 2021-06-12 NOTE — Patient Instructions (Addendum)
We will try again to order overnight oximetry on 2lpm   We will schedule you for an echocardiogram  To get the most out of exercise, you need to be continuously aware that you are short of breath, but never out of breath, for at least 30 minutes daily. As you improve, it will actually be easier for you to do the same amount of exercise  in  30 minutes so always push to the level where you are short of breath.     Continue off fosamax for now Try prilosec (omeprazole) otc 20mg   Take 30-60 min before first meal of the day and Pepcid ac (famotidine) 20 mg one @  supper until return   GERD (REFLUX)  is an extremely common cause of respiratory symptoms just like yours , many times with no obvious heartburn at all.    It can be treated with medication, but also with lifestyle changes including elevation of the head of your bed (ideally with 6 -8inch blocks under the headboard of your bed),  Smoking cessation, avoidance of late meals, excessive alcohol, and avoid fatty foods, chocolate, peppermint, colas, red wine, and acidic juices such as orange juice.  NO MINT OR MENTHOL PRODUCTS SO NO COUGH DROPS  USE SUGARLESS CANDY INSTEAD (Jolley ranchers or Stover's or Life Savers) or even ice chips will also do - the key is to swallow to prevent all throat clearing. NO OIL BASED VITAMINS - use powdered substitutes.  Avoid fish oil when coughing.    Please schedule a follow up visit in 3 months but call sooner if needed  with all medications /inhalers/ solutions in hand so we can verify exactly what you are taking. This includes all medications from all doctors and over the counters

## 2021-06-13 ENCOUNTER — Encounter: Payer: Self-pay | Admitting: Family Medicine

## 2021-06-13 ENCOUNTER — Encounter: Payer: Self-pay | Admitting: Internal Medicine

## 2021-06-13 ENCOUNTER — Ambulatory Visit (INDEPENDENT_AMBULATORY_CARE_PROVIDER_SITE_OTHER): Payer: Medicare Other | Admitting: Family Medicine

## 2021-06-13 ENCOUNTER — Telehealth: Payer: Self-pay

## 2021-06-13 VITALS — BP 156/71 | HR 77 | Resp 16 | Ht 60.0 in | Wt 198.0 lb

## 2021-06-13 DIAGNOSIS — R7303 Prediabetes: Secondary | ICD-10-CM

## 2021-06-13 DIAGNOSIS — Z0000 Encounter for general adult medical examination without abnormal findings: Secondary | ICD-10-CM

## 2021-06-13 DIAGNOSIS — I10 Essential (primary) hypertension: Secondary | ICD-10-CM

## 2021-06-13 DIAGNOSIS — E785 Hyperlipidemia, unspecified: Secondary | ICD-10-CM

## 2021-06-13 DIAGNOSIS — M8580 Other specified disorders of bone density and structure, unspecified site: Secondary | ICD-10-CM

## 2021-06-13 DIAGNOSIS — H547 Unspecified visual loss: Secondary | ICD-10-CM

## 2021-06-13 DIAGNOSIS — R058 Other specified cough: Secondary | ICD-10-CM | POA: Insufficient documentation

## 2021-06-13 MED ORDER — AMLODIPINE BESYLATE 10 MG PO TABS: 10.0000 mg | ORAL_TABLET | Freq: Every day | ORAL | 1 refills | Status: DC

## 2021-06-13 MED ORDER — ATORVASTATIN CALCIUM 20 MG PO TABS: 20.0000 mg | ORAL_TABLET | Freq: Every day | ORAL | 1 refills | Status: DC

## 2021-06-13 NOTE — Assessment & Plan Note (Signed)
06/12/2021 try off fosamax with max gerd rx   Upper airway cough syndrome (previously labeled PNDS),  is so named because it's frequently impossible to sort out how much is  CR/sinusitis with freq throat clearing (which can be related to primary GERD)   vs  causing  secondary (" extra esophageal")  GERD from wide swings in gastric pressure that occur with throat clearing, often  promoting self use of mint and menthol lozenges that reduce the lower esophageal sphincter tone and exacerbate the problem further in a cyclical fashion.   These are the same pts (now being labeled as having "irritable larynx syndrome" by some cough centers) who not infrequently have a history of having failed to tolerate ace inhibitors,  dry powder inhalers or biphosphonates or report having atypical/extraesophageal reflux symptoms that don't respond to standard doses of PPI  and are easily confused as having aecopd or asthma flares by even experienced allergists/ pulmonologists (myself included).   >>> f/u in 3 m with all meds in hand using a trust but verify approach to confirm accurate Medication  Reconciliation The principal here is that until we are certain that the  patients are doing what we've asked, it makes no sense to ask them to do more.   Each maintenance medication was reviewed in detail including emphasizing most importantly the difference between maintenance and prns and under what circumstances the prns are to be triggered using an action plan format where appropriate.  Total time for H and P, chart review, counseling,  directly observing portions of ambulatory 02 saturation study/ and generating customized AVS unique to this office visit / same day charting  > 30 min

## 2021-06-13 NOTE — Assessment & Plan Note (Signed)
Placed on 02 by Dr Juanetta Gosling for noct use with restrictive changes on pfts and very low erv c/w effects of body habitus  -  01/29/2021   Walked RA  approx  400 ft  @ slow to moderate pace  stopped due to  Min sob/ sats 95%      - ONO on 2lpm 01/29/2021  - PFT's   05/08/21   FVC  1.44 ( 78%) s obst and ERV  0.11 actual vol at wt = 199  with DLCO  7.02 (41%) corrects to 4.28 (92%)  for alv volume and FV curve no obstruction   - 06/12/2021   Walked on RA x  3  lap(s) =  approx 477ft  @ slow pace, stopped due to end of study c/o sob p 1st lap  with lowest 02 sats 94   She may have noct desats but does not appear to need resting or walking 02   Rec: ono on 2lpm

## 2021-06-13 NOTE — Patient Instructions (Addendum)
F/U in early March, call if you need me sooner  Please get covid booster , past due  HBA1c, fasting lipid, cmp and eGFR,cBC and tSH today  You will be started on a twice yearly injection Prolia for bones once I have your labs  Please schedule mammogram at checkout  We are referring you for eye exam to " [facility]"  Thanks for choosing [facility], we consider it a privelige to serve you.

## 2021-06-13 NOTE — Progress Notes (Signed)
    [name]     MRN: [id number]      DOB: 1946-03-19  HPI: Patient is in for annual physical exam. Will start osteopenia treatment , ahs facture history and oral agents CI labs,today and are reviewed after visi when available Immunization is reviewed , and  updated if needed.   PE: BP (!) 156/71   Pulse 77   Resp 16   Ht 5' (1.524 m)   Wt 198 lb (89.8 kg)   SpO2 94%   BMI 38.67 kg/m   Pleasant  female, alert and oriented x 3, in no cardio-pulmonary distress. Afebrile. HEENT No facial trauma or asymetry. Sinuses non tender.  Extra occullar muscles intact.. External ears normal, . Neck: decreased ROM , no adenopathy,JVD or thyromegaly.No bruits.  Chest: Clear to ascultation bilaterally.No crackles or wheezes. Non tender to palpation  Breast: No asymetry,no masses or lumps. No tenderness. No nipple discharge or inversion. No axillary or supraclavicular adenopathy  Cardiovascular system; Heart sounds normal,  S1 and  S2 ,no S3.  No murmur, or thrill. Apical beat not displaced Peripheral pulses normal.  Abdomen: Soft, non tender, no organomegaly or masses. No bruits. Bowel sounds normal. No guarding, tenderness or rebound.      Musculoskeletal exam: Decreased  ROM of spine, hips , shoulders and knees. No deformity ,swelling or crepitus noted. No muscle wasting or atrophy.   Neurologic: Cranial nerves 2 to 12 intact. Power, tone ,sensation and reflexes normal throughout. No disturbance in gait. No tremor.  Skin: Intact, no ulceration, erythema , scaling or rash noted. Pigmentation normal throughout  Psych; Normal mood and affect. Judgement and concentration normal   Assessment & Plan:   Annual physical exam Annual exam as documented. Counseling done  re healthy lifestyle involving commitment to 150 minutes exercise per week, heart healthy diet, and attaining healthy weight.The importance of adequate sleep also discussed. Regular seat belt use  and home safety, is also discussed. Changes in health habits are decided on by the patient with goals and time frames  set for achieving them. Immunization and cancer screening needs are specifically addressed at this visit.   Osteopenia Due to GERD, oral agents contraindicated, Prolia twice yearly to start once renal functio re evaluated, also to take calcium and vit D  Essential hypertension Elevated, but has not taken medication DASH diet and commitment to daily physical activity for a minimum of 30 minutes discussed and encouraged, as a part of hypertension management. The importance of attaining a healthy weight is also discussed.  BP/Weight 06/13/2021 06/12/2021 05/02/2021 01/29/2021 01/26/2021 12/12/2020 08/21/2020  Systolic BP 156 142 - 134 160 196 126  Diastolic BP 71 80 - 68 99 82 63  Wt. (Lbs) 198 199 197 197 192 193 192  BMI 38.67 38.86 38.47 38.47 37.5 37.69 37.5

## 2021-06-13 NOTE — Assessment & Plan Note (Signed)
ERV of nearly 0 recorded in 04/2015 @ wt =  199 c/w effects of obesity on lung volumes   Body mass index is 38.86 kg/m.  -  trending  No change  Lab Results  Component Value Date   TSH 1.920 04/13/2020      Contributing to doe and risk of GERD >>>   reviewed the need and the process to achieve and maintain neg calorie balance > defer f/u primary care including intermittently monitoring thyroid status

## 2021-06-13 NOTE — Assessment & Plan Note (Signed)
Echo  01/29/13  Left ventricle: Mild to moderate concentric hypertrophy.  Systolic function was normal. The estimated ejection  fraction was in the range of 55% to 60% with G 1 diastolic dysfunction   - Right ventricle: The cavity size was mildly dilated. RV  systolic pressure: 22mm Hg (S, est).   - Echo 06/12/2021 >>>  conssider refer back to cards

## 2021-06-13 NOTE — Assessment & Plan Note (Signed)

## 2021-06-13 NOTE — Telephone Encounter (Signed)
Wanted refill of meloxicam. Last sent 07/2020 for #7. States she needs it for as needed use- and more quantity since it will go to mail order

## 2021-06-14 LAB — CMP14+EGFR
ALT: 12 IU/L (ref 0–32)
AST: 18 IU/L (ref 0–40)
Albumin/Globulin Ratio: 1.2 (ref 1.2–2.2)
Albumin: 3.8 g/dL (ref 3.7–4.7)
Alkaline Phosphatase: 81 IU/L (ref 44–121)
BUN/Creatinine Ratio: 15 (ref 12–28)
BUN: 11 mg/dL (ref 8–27)
Bilirubin Total: 0.7 mg/dL (ref 0.0–1.2)
CO2: 25 mmol/L (ref 20–29)
Calcium: 9.6 mg/dL (ref 8.7–10.3)
Chloride: 106 mmol/L (ref 96–106)
Creatinine, Ser: 0.75 mg/dL (ref 0.57–1.00)
Globulin, Total: 3.1 g/dL (ref 1.5–4.5)
Glucose: 89 mg/dL (ref 70–99)
Potassium: 3.8 mmol/L (ref 3.5–5.2)
Sodium: 147 mmol/L — ABNORMAL HIGH (ref 134–144)
Total Protein: 6.9 g/dL (ref 6.0–8.5)
eGFR: 84 mL/min/{1.73_m2} (ref >59)

## 2021-06-14 LAB — CBC
Hematocrit: 39.7 % (ref 34.0–46.6)
Hemoglobin: 12.9 g/dL (ref 11.1–15.9)
MCH: 29.2 pg (ref 26.6–33.0)
MCHC: 32.5 g/dL (ref 31.5–35.7)
MCV: 90 fL (ref 79–97)
Platelets: 180 10*3/uL (ref 150–450)
RBC: 4.42 x10E6/uL (ref 3.77–5.28)
RDW: 13 % (ref 11.7–15.4)
WBC: 8.5 10*3/uL (ref 3.4–10.8)

## 2021-06-14 LAB — LIPID PANEL
Chol/HDL Ratio: 2.3 ratio (ref 0.0–4.4)
Cholesterol, Total: 176 mg/dL (ref 100–199)
HDL: 75 mg/dL (ref >39)
LDL Chol Calc (NIH): 87 mg/dL (ref 0–99)
Triglycerides: 78 mg/dL (ref 0–149)
VLDL Cholesterol Cal: 14 mg/dL (ref 5–40)

## 2021-06-14 LAB — HEMOGLOBIN A1C
Est. average glucose Bld gHb Est-mCnc: 134 mg/dL
Hgb A1c MFr Bld: 6.3 % — ABNORMAL HIGH (ref 4.8–5.6)

## 2021-06-14 LAB — TSH: TSH: 1.08 u[IU]/mL (ref 0.450–4.500)

## 2021-06-15 ENCOUNTER — Other Ambulatory Visit: Payer: Self-pay | Admitting: Family Medicine

## 2021-06-15 ENCOUNTER — Other Ambulatory Visit: Payer: Self-pay

## 2021-06-15 MED ORDER — MELOXICAM 15 MG PO TABS: 15.0000 mg | ORAL_TABLET | Freq: Every day | ORAL | 0 refills | Status: DC

## 2021-06-15 NOTE — Telephone Encounter (Signed)
Rx sent in

## 2021-06-17 DIAGNOSIS — M8580 Other specified disorders of bone density and structure, unspecified site: Secondary | ICD-10-CM | POA: Insufficient documentation

## 2021-06-17 MED ORDER — DENOSUMAB 60 MG/ML ~~LOC~~ SOSY: 60.0000 mg | PREFILLED_SYRINGE | Freq: Once | SUBCUTANEOUS | 0 refills | Status: AC

## 2021-06-17 NOTE — Assessment & Plan Note (Signed)
Due to GERD, oral agents contraindicated, Prolia twice yearly to start once renal functio re evaluated, also to take calcium and vit D

## 2021-06-18 ENCOUNTER — Telehealth: Payer: Self-pay | Admitting: Family Medicine

## 2021-06-18 DIAGNOSIS — H547 Unspecified visual loss: Secondary | ICD-10-CM | POA: Insufficient documentation

## 2021-06-18 NOTE — Telephone Encounter (Signed)
A a f/u from labs,I had advised she needs to start Prolia, once kidney function checked. It is excellent. Needs to start Prolia every 6 months, pls arrange so we can administer in the office. Needs also to to take calcium 100 to 1200 mg daily and vit D 1000 IU for bones and walk! I sent in rx for Polia

## 2021-06-18 NOTE — Assessment & Plan Note (Signed)
Elevated, but has not taken medication DASH diet and commitment to daily physical activity for a minimum of 30 minutes discussed and encouraged, as a part of hypertension management. The importance of attaining a healthy weight is also discussed.  BP/Weight 06/13/2021 06/12/2021 05/02/2021 01/29/2021 01/26/2021 12/12/2020 08/21/2020  Systolic BP 156 142 - 134 160 222 126  Diastolic BP 71 80 - 68 99 82 63  Wt. (Lbs) 198 199 197 197 192 193 192  BMI 38.67 38.86 38.47 38.47 37.5 37.69 37.5

## 2021-06-21 NOTE — Telephone Encounter (Signed)
Called patient and left message for them to return call at the office   

## 2021-06-25 ENCOUNTER — Ambulatory Visit (HOSPITAL_COMMUNITY)
Admission: RE | Admit: 2021-06-25 | Discharge: 2021-06-25 | Disposition: A | Discharge status: Home / Self Care | Payer: Medicare Other | Source: Ambulatory Visit | Attending: Family Medicine | Admitting: Family Medicine

## 2021-06-25 ENCOUNTER — Other Ambulatory Visit: Payer: Self-pay

## 2021-06-25 DIAGNOSIS — Z1231 Encounter for screening mammogram for malignant neoplasm of breast: Secondary | ICD-10-CM | POA: Diagnosis not present

## 2021-06-27 NOTE — Telephone Encounter (Signed)
Called patient and left message for them to return call at the office   

## 2021-06-29 NOTE — Telephone Encounter (Signed)
Med needs PA- will try to get auth

## 2021-07-04 DIAGNOSIS — J984 Other disorders of lung: Secondary | ICD-10-CM | POA: Diagnosis not present

## 2021-07-04 NOTE — Telephone Encounter (Signed)
PA stated that med did not need authorization

## 2021-07-05 ENCOUNTER — Other Ambulatory Visit: Payer: Self-pay | Admitting: Family Medicine

## 2021-07-12 NOTE — Telephone Encounter (Signed)
Left message that med didn't need auth and to let me know if she was able to get it

## 2021-07-16 ENCOUNTER — Other Ambulatory Visit: Payer: Self-pay | Admitting: Family Medicine

## 2021-07-17 ENCOUNTER — Ambulatory Visit (HOSPITAL_COMMUNITY)
Admission: RE | Admit: 2021-07-17 | Discharge: 2021-07-17 | Disposition: A | Discharge status: Home / Self Care | Payer: Medicare Other | Source: Ambulatory Visit | Attending: Internal Medicine | Admitting: Internal Medicine

## 2021-07-17 ENCOUNTER — Other Ambulatory Visit: Payer: Self-pay

## 2021-07-17 DIAGNOSIS — I272 Pulmonary hypertension, unspecified: Secondary | ICD-10-CM | POA: Diagnosis not present

## 2021-07-17 DIAGNOSIS — R0609 Other forms of dyspnea: Secondary | ICD-10-CM

## 2021-07-17 LAB — ECHOCARDIOGRAM COMPLETE
Area-P 1/2: 2.62 cm2
S' Lateral: 2 cm

## 2021-07-17 MED ORDER — PERFLUTREN LIPID MICROSPHERE
1.0000 mL | INTRAVENOUS | Status: AC | PRN
  Administered 2021-07-17: 09:00:00 5 mL via INTRAVENOUS
  Filled 2021-07-17: qty 10

## 2021-07-17 NOTE — Progress Notes (Signed)
*  PRELIMINARY RESULTS* Echocardiogram 2D Echocardiogram has been performed with Definity.  Stacey Drain 07/17/2021, 9:44 AM

## 2021-07-18 ENCOUNTER — Other Ambulatory Visit: Payer: Self-pay

## 2021-07-18 DIAGNOSIS — J9611 Chronic respiratory failure with hypoxia: Secondary | ICD-10-CM

## 2021-07-24 ENCOUNTER — Telehealth: Payer: Self-pay | Admitting: Family Medicine

## 2021-07-24 NOTE — Telephone Encounter (Signed)
Pt called and wanted to discuss the most recent med added for her, pt states that she doesn not know what the med is   Pt wants to discuss

## 2021-07-26 ENCOUNTER — Other Ambulatory Visit: Payer: Self-pay

## 2021-07-26 MED ORDER — GABAPENTIN 300 MG PO CAPS: 300.0000 mg | ORAL_CAPSULE | Freq: Every day | ORAL | 3 refills | Status: DC

## 2021-07-26 MED ORDER — MELOXICAM 15 MG PO TABS: 15.0000 mg | ORAL_TABLET | Freq: Every day | ORAL | 7 refills | Status: DC

## 2021-07-26 MED ORDER — SERTRALINE HCL 25 MG PO TABS: 25.0000 mg | ORAL_TABLET | Freq: Every day | ORAL | 3 refills | Status: DC

## 2021-07-26 NOTE — Telephone Encounter (Signed)
Refills sent to pharmacy. 

## 2021-08-04 DIAGNOSIS — J984 Other disorders of lung: Secondary | ICD-10-CM | POA: Diagnosis not present

## 2021-09-03 ENCOUNTER — Other Ambulatory Visit: Payer: Self-pay

## 2021-09-03 ENCOUNTER — Ambulatory Visit: Payer: Medicare Other | Admitting: Internal Medicine

## 2021-09-03 ENCOUNTER — Encounter: Payer: Self-pay | Admitting: Internal Medicine

## 2021-09-03 DIAGNOSIS — J9611 Chronic respiratory failure with hypoxia: Secondary | ICD-10-CM | POA: Diagnosis not present

## 2021-09-03 DIAGNOSIS — R058 Other specified cough: Secondary | ICD-10-CM | POA: Diagnosis not present

## 2021-09-03 DIAGNOSIS — I272 Pulmonary hypertension, unspecified: Secondary | ICD-10-CM

## 2021-09-03 NOTE — Patient Instructions (Signed)
Ok to start the Prolia per [name]    I will make sure you have an overnight test as soon as possible while you are wearing 0xygen to make sure you have enough   .Please schedule a follow up visit in  6 months but call sooner if needed

## 2021-09-03 NOTE — Addendum Note (Signed)
Addended by: Carleene Mains D on: 09/03/2021 12:17 PM   Modules accepted: Orders

## 2021-09-03 NOTE — Assessment & Plan Note (Addendum)
Placed on 02 by Dr Juanetta Gosling for noct use with restrictive changes on pfts and very low erv c/w effects of body habitus  -  01/29/2021   Walked RA  approx  400 ft  @ slow to moderate pace  stopped due to  Min sob/ sats 95%      - ONO on 2lpm 01/29/2021  - PFT's   05/08/21   FVC  1.44 ( 78%) s obst and ERV  0.11 actual vol at wt = 199  with DLCO  7.02 (41%) corrects to 4.28 (92%)  for alv volume and FV curve no obstruction   - 06/12/2021   Walked on RA x  3  lap(s) =  approx 422ft  @ slow pace, stopped due to end of study c/o sob p 1st lap  with lowest 02 sats 94   ono on 2lpm ordered again/ advised to call if not getting what she needs from 02 company in terms of service/ monitoring as noct 02 is critical part of her pulmonary rx.

## 2021-09-03 NOTE — Assessment & Plan Note (Addendum)
06/12/2021 try off fosamax with max gerd rx > resolved   Strongly suggestive of gerd driving uacs so rec stay off fosamax/ on gerd rx and f/u 6 months          Each maintenance medication was reviewed in detail including emphasizing most importantly the difference between maintenance and prns and under what circumstances the prns are to be triggered using an action plan format where appropriate.  Total time for H and P, chart review, counseling, reviewing 02 device(s) and generating customized AVS unique to this office visit / same day charting = 30 min

## 2021-09-03 NOTE — Assessment & Plan Note (Signed)
Echo  01/29/13  Left ventricle: Mild to moderate concentric hypertrophy.  Systolic function was normal. The estimated ejection  fraction was in the range of 55% to 60% with G 1 diastolic dysfunction  - Right ventricle: The cavity size was mildly dilated. RV  systolic pressure: 51mm Hg (S, est).   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

## 2021-09-03 NOTE — Progress Notes (Signed)
[name], female    DOB: [birth year]   MRN: [id number]   Brief patient profile:  61 yobf never smoker previously followed by Dr Hessie Knows 02/15/16  = 196   Pft report  05/2013  1. Spirometry shows a mild ventilatory defect without definite airflow     obstruction. 2. Lung volumes are mildly to moderately reduced. 3. DLCO is moderately reduced. 4. Airway resistance is slightly elevated. 5. There is improvement with inhaled bronchodilator, but it does not     reach the level of significance. 6. Arterial blood gas is normal.  PFTs 04/22/15 minimal curvature, no better p saba,  ERV  almost 0      History of Present Illness  01/29/2021  Pulmonary/ 1st office eval/ [name] / [facility]  No chief complaint on file. 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 Mb is 100 ft no hills and has to stop at house  Cough: none  Sleep: flat bed and 2 pillows  SABA use: not sure it helps  Overt hb since starting fosamax, unclear how long she's been on it  02 not keeping up with it at all  Covid vax x 4   Rec We will walk you today to make sure you have enough oxygen when you walk We will arrange for Overight 02 level while on 2lpm. Once we're sure we've corrected problem, then we will repeat your echo looking at your heart function.  Please schedule a follow up visit in 3 months  with pfts on return - call sooner if needed     06/12/2021  f/u ov/[place]/[name] re: MO/doe/ hypoxemia/ PH maint on ravatio but not seeing cards   Chief Complaint  Patient presents with   Follow-up    SOB on exertion is about the same as last OV.   Dyspnea:  doing water aerobics Cough:  2-3 times a week a week wakes up with cough /  Sleeping: ok on 2 pillows  SABA use: none needed  02: 2lpm hs only  Covid status: vax x 4 Lung cancer  screening: never smoker  Rec We will try again to order overnight oximetry on 2lpm  We will schedule you for an echocardiogram To get the most out of exercise, you need to be continuously aware that you are short of breath, but never out of breath, for at least 30 minutes daily. Continue off fosamax for now Try prilosec (omeprazole) otc 20mg   Take 30-60 min before first meal of the day and Pepcid ac (famotidine) 20 mg one @  supper until return  GERD diet reviewed, bed blocks rec  Please schedule a follow up visit in 3 months but call sooner if needed  with all medications /inhalers/ solutions in hand so we can verify exactly what you are taking. This includes all medications from all doctors and over the counters    09/03/2021  f/u ov/[place] [facility]/[name] re: MO/doe/PH maint on 02 hs   Chief Complaint  Patient presents with   Follow-up    SOB has not improved since last OV.  ONO not completed.  Dyspnea:  water aerobics 3 x  per week// no walking due  to back problems which limit her  Cough: none now  Sleeping: flat in in bed, 2 pillows  SABA use: none 02: 2lpm at hs,  ONO not checked yet  Covid status: vax x 4     No obvious day to day or daytime variability or assoc excess/ purulent sputum or mucus plugs or hemoptysis or cp or chest tightness, subjective wheeze or overt sinus or hb symptoms.   Sleeping  without nocturnal  or early am exacerbation  of respiratory  c/o's or need for noct saba. Also denies any obvious fluctuation of symptoms with weather or environmental changes or other aggravating or alleviating factors except as outlined above   No unusual exposure hx or h/o childhood pna/ asthma or knowledge of premature birth.  Current Allergies, Complete Past Medical History, Past Surgical History, Family History, and Social History were reviewed in [employer] record.  ROS  The following are not active complaints unless bolded Hoarseness, sore throat,  dysphagia, dental problems, itching, sneezing,  nasal congestion or discharge of excess mucus or purulent secretions, ear ache,   fever, chills, sweats, unintended wt loss or wt gain, classically pleuritic or exertional cp,  orthopnea pnd or arm/hand swelling  or leg swelling, presyncope, palpitations, abdominal pain, anorexia, nausea, vomiting, diarrhea  or change in bowel habits or change in bladder habits, change in stools or change in urine, dysuria, hematuria,  rash, arthralgias, visual complaints, headache, numbness, weakness or ataxia or problems with walking or coordination,  change in mood or  memory.        Current Meds  Medication Sig   amLODipine (NORVASC) 10 MG tablet Take 1 tablet (10 mg total) by mouth daily.   amLODipine (NORVASC) 5 MG tablet TAKE 1 TABLET BY MOUTH AT  BEDTIME   aspirin 81 MG EC tablet Take 81 mg by mouth daily. Take one tablet by mouth once a day   Cholecalciferol (VITAMIN D3) 125 MCG (5000 UT) CAPS Take 1 capsule (5,000 Units total) by mouth daily.   clotrimazole-betamethasone (LOTRISONE) cream Apply cream twice daily to rash in groin for 10 days, then as needed   famotidine (PEPCID) 10 MG tablet Take 10 mg by mouth 2 (two) times daily.   fenoprofen (NALFON) 600 MG TABS tablet Take 600 mg by mouth.   gabapentin (NEURONTIN) 300 MG capsule Take 1 capsule (300 mg total) by mouth at bedtime.   Hydrocortisone (GERHARDT'S BUTT CREAM) CREA Apply 1 application topically 3 (three) times daily.   ibuprofen (ADVIL) 600 MG tablet Take 600 mg by mouth every 6 (six) hours as needed.   losartan-hydrochlorothiazide (HYZAAR) 100-12.5 MG tablet Take 1 tablet by mouth daily.   meloxicam (MOBIC) 15 MG tablet Take 1 tablet (15 mg total) by mouth daily.   metoprolol succinate (TOPROL-XL) 50 MG 24 hr tablet TAKE 1 TABLET BY MOUTH  DAILY WITH OR IMMEDIATELY  FOLLOWING A MEAL   nystatin (NYSTATIN) powder Apply 1 application topically 3 (three) times daily.   olopatadine (PATANOL) 0.1 %  ophthalmic solution INSTILL 1 DROP INTO BOTH EYES TWICE A DAY   omeprazole (PRILOSEC) 20 MG capsule TAKE 1 CAPSULE BY MOUTH  DAILY   potassium chloride SA (KLOR-CON) 20 MEQ tablet TAKE 1 TABLET BY MOUTH  DAILY   predniSONE (DELTASONE) 10 MG tablet Take 10 mg by mouth 2 (two) times daily with a meal.   pyridOXINE (VITAMIN B-6) 100 MG tablet Take 200 mg by mouth daily.   sertraline (ZOLOFT) 25 MG tablet Take 1 tablet (25 mg total) by mouth daily.          Past Medical  History:  Diagnosis Date   Asthma    Back pain    Depression    Diverticulosis of colon    GERD (gastroesophageal reflux disease)    Hyperlipidemia    Hypertension    Obesity    Osteoarthrosis, unspecified whether generalized or localized, lower leg    bilateral knees    Pulmonary hypertension (HCC)      Objective:     09/03/2021       197   06/12/21 199 lb (90.3 kg)  05/02/21 197 lb (89.4 kg)  01/29/21 197 lb (89.4 kg)    Vital signs reviewed  09/03/2021  - Note at rest 02 sats  99% on RA   General appearance:    amb obese bf nad     HEENT : pt wearing mask not removed for exam due to covid -19 concerns.    NECK :  without JVD/Nodes/TM/ nl carotid upstrokes bilaterally   LUNGS: no acc muscle use,  Nl contour chest which is clear to A and P bilaterally without cough on insp or exp maneuvers   CV:  RRR  no s3 or murmur or increase in P2, and no edema   ABD:  soft and nontender with nl inspiratory excursion in the supine position. No bruits or organomegaly appreciated, bowel sounds nl  MS:  Nl gait/ ext warm without deformities, calf tenderness, cyanosis or clubbing No obvious joint restrictions   SKIN: warm and dry without lesions    NEURO:  alert, approp, nl sensorium with  no motor or cerebellar deficits apparent.                Assessment

## 2021-09-07 DIAGNOSIS — J984 Other disorders of lung: Secondary | ICD-10-CM | POA: Diagnosis not present

## 2021-09-18 LAB — HM DIABETES EYE EXAM

## 2021-10-02 ENCOUNTER — Ambulatory Visit (INDEPENDENT_AMBULATORY_CARE_PROVIDER_SITE_OTHER): Payer: Medicare Other | Admitting: Family Medicine

## 2021-10-02 ENCOUNTER — Encounter: Payer: Self-pay | Admitting: Family Medicine

## 2021-10-02 ENCOUNTER — Other Ambulatory Visit: Payer: Self-pay

## 2021-10-02 ENCOUNTER — Encounter (INDEPENDENT_AMBULATORY_CARE_PROVIDER_SITE_OTHER): Payer: Self-pay

## 2021-10-02 VITALS — BP 140/74 | HR 73 | Resp 16 | Ht 60.0 in | Wt 188.8 lb

## 2021-10-02 DIAGNOSIS — F419 Anxiety disorder, unspecified: Secondary | ICD-10-CM

## 2021-10-02 DIAGNOSIS — F32A Depression, unspecified: Secondary | ICD-10-CM | POA: Diagnosis not present

## 2021-10-02 DIAGNOSIS — E559 Vitamin D deficiency, unspecified: Secondary | ICD-10-CM

## 2021-10-02 DIAGNOSIS — E785 Hyperlipidemia, unspecified: Secondary | ICD-10-CM

## 2021-10-02 DIAGNOSIS — I10 Essential (primary) hypertension: Secondary | ICD-10-CM | POA: Diagnosis not present

## 2021-10-02 DIAGNOSIS — R7303 Prediabetes: Secondary | ICD-10-CM | POA: Diagnosis not present

## 2021-10-02 DIAGNOSIS — J984 Other disorders of lung: Secondary | ICD-10-CM | POA: Diagnosis not present

## 2021-10-02 NOTE — Patient Instructions (Addendum)
?  F/U in 3 months, re eval blood pressure  ?You may get covid booster at any pharmacy, with your card ? ? ?HBA1C, chem 7 and EGFR and Vit D today ? ?STOP meloxicam, use tylenol for pain instead ? ?Fasting lipid, cmp and EGFR 3 days before next visit ? ?Please bring ALL meds to next visit ? ?Thanks for choosing [facility], we consider it a privelige to serve you. ? ? ? ? ? ? ? ?

## 2021-10-03 LAB — HEMOGLOBIN A1C
Est. average glucose Bld gHb Est-mCnc: 131 mg/dL
Hgb A1c MFr Bld: 6.2 % — ABNORMAL HIGH (ref 4.8–5.6)

## 2021-10-03 LAB — BMP8+EGFR
BUN/Creatinine Ratio: 16 (ref 12–28)
BUN: 11 mg/dL (ref 8–27)
CO2: 26 mmol/L (ref 20–29)
Calcium: 9.6 mg/dL (ref 8.7–10.3)
Chloride: 103 mmol/L (ref 96–106)
Creatinine, Ser: 0.69 mg/dL (ref 0.57–1.00)
Glucose: 111 mg/dL — ABNORMAL HIGH (ref 70–99)
Potassium: 3.5 mmol/L (ref 3.5–5.2)
Sodium: 147 mmol/L — ABNORMAL HIGH (ref 134–144)
eGFR: 92 mL/min/{1.73_m2} (ref >59)

## 2021-10-03 LAB — VITAMIN D 25 HYDROXY (VIT D DEFICIENCY, FRACTURES): Vit D, 25-Hydroxy: 44.2 ng/mL (ref 30.0–100.0)

## 2021-10-08 ENCOUNTER — Encounter: Payer: Self-pay | Admitting: Family Medicine

## 2021-10-08 NOTE — Assessment & Plan Note (Signed)
?  Patient re-educated about  the importance of commitment to a  minimum of 150 minutes of exercise per week as able. ? ?The importance of healthy food choices with portion control discussed, as well as eating regularly and within a 12 hour window most days. ?The need to choose "clean , green" food 50 to 75% of the time is discussed, as well as to make water the primary drink and set a goal of 64 ounces water daily. ? ?  ?Weight /BMI 10/02/2021 09/03/2021 06/13/2021  ?WEIGHT 188 lb 12.8 oz 197 lb 198 lb  ?HEIGHT 5\' 0"  5\' 0"  5\' 0"   ?BMI 36.87 kg/m2 38.47 kg/m2 38.67 kg/m2  ? ? ? ?

## 2021-10-08 NOTE — Assessment & Plan Note (Signed)
Uncontrolled ?DASH diet and commitment to daily physical activity for a minimum of 30 minutes discussed and encouraged, as a part of hypertension management. ?The importance of attaining a healthy weight is also discussed. ? ?BP/Weight 10/02/2021 09/03/2021 07/17/2021 06/13/2021 06/12/2021 05/02/2021 01/29/2021  ?Systolic BP XX123456 A999333 [id number] A999333 142 - 134  ?Diastolic BP 74 74 76 71 80 - 68  ?Wt. (Lbs) 188.8 197 - 198 199 197 197  ?BMI 36.87 38.47 - 38.67 38.86 38.47 38.47  ? ? ? ?Will stop NSAID, continue weight loss ?

## 2021-10-08 NOTE — Progress Notes (Signed)
? ?[name]     MRN: [id number]      DOB: 09-Nov-1945 ? ? ?HPI ?[name] is here for follow up and re-evaluation of chronic medical conditions, medication management and review of any available recent lab and radiology data.  ?Preventive health is updated, specifically  Cancer screening and Immunization.   ?Questions or concerns regarding consultations or procedures which the PT has had in the interim are  addressed. ?The PT denies any adverse reactions to current medications since the last visit.  ?There are no new concerns.  ?There are no specific complaints  ?Denies polyuria, polydipsia, blurred vision , or hypoglycemic episodes. ? ? ?ROS ?Denies recent fever or chills. ?Denies sinus pressure, nasal congestion, ear pain or sore throat. ?Denies chest congestion, productive cough or wheezing. ?Denies chest pains, palpitations and leg swelling ?Denies abdominal pain, nausea, vomiting,diarrhea or constipation.   ?Denies dysuria, frequency, hesitancy or incontinence. ?Denies  uncontroled joint pain, swelling and limitation in mobility. ?Denies headaches, seizures, numbness, or tingling. ?Denies depression, anxiety or insomnia. ?Denies skin break down or rash. ? ? ?PE ? ?BP 140/74   Pulse 73   Resp 16   Ht 5' (1.524 m)   Wt 188 lb 12.8 oz (85.6 kg)   SpO2 92%   BMI 36.87 kg/m?  ? ?Patient alert and oriented and in no cardiopulmonary distress. ? ?HEENT: No facial asymmetry, EOMI,     Neck supple . ? ?Chest: Clear to auscultation bilaterally. ? ?CVS: S1, S2 no murmurs, no S3.Regular rate. ? ?ABD: Soft non tender.  ? ?Ext: No edema ? ?MS: decreased  ROM spine, shoulders, hips and knees. ? ?Skin: Intact, no ulcerations or rash noted. ? ?Psych: Good eye contact, normal affect. Memory intact not anxious or depressed appearing. ? ?CNS: CN 2-12 intact, power,  normal throughout.no focal deficits noted. ? ? ?Assessment & Plan ? ?Essential hypertension ?Uncontrolled ?DASH diet and commitment to daily physical  activity for a minimum of 30 minutes discussed and encouraged, as a part of hypertension management. ?The importance of attaining a healthy weight is also discussed. ? ?BP/Weight 10/02/2021 09/03/2021 07/17/2021 06/13/2021 06/12/2021 05/02/2021 01/29/2021  ?Systolic BP [id number] [id number] A999333 142 - 134  ?Diastolic BP 74 74 76 71 80 - 68  ?Wt. (Lbs) 188.8 197 - 198 199 197 197  ?BMI 36.87 38.47 - 38.67 38.86 38.47 38.47  ? ? ? ?Will stop NSAID, continue weight loss ? ?Hyperlipidemia LDL goal <100 ?Hyperlipidemia:Low fat diet discussed and encouraged. ? ? ?Lipid Panel  ?Lab Results  ?Component Value Date  ? CHOL 176 06/13/2021  ? HDL 75 06/13/2021  ? [place] 87 06/13/2021  ? TRIG 78 06/13/2021  ? CHOLHDL 2.3 06/13/2021  ? ?Controlled, no change in medication ? ? ? ? ?Prediabetes ?Patient educated about the importance of limiting  Carbohydrate intake , the need to commit to daily physical activity for a minimum of 30 minutes , and to commit weight loss. ?The fact that changes in all these areas will reduce or eliminate all together the development of diabetes is stressed.  ?improived ? ?Diabetic Labs Latest Ref Rng & Units 10/02/2021 06/13/2021 12/12/2020 08/15/2020 04/13/2020  ?HbA1c 4.8 - 5.6 % 6.2(H) 6.3(H) 6.2(H) 6.2(H) -  ?Microalbumin 0.00 - 1.89 mg/dL - - - - -  ?Micro/Creat Ratio 0.0 - 30.0 mg/g - - - - -  ?Chol 100 - 199 mg/dL - 176 - 164 177  ?HDL >39 mg/dL - 75 - 73 82  ?Calc LDL 0 -  99 mg/dL - 87 - 79 83  ?Triglycerides 0 - 149 mg/dL - 78 - 58 63  ?Creatinine 0.57 - 1.00 mg/dL 0.69 0.75 0.86 0.77 -  ? ?BP/Weight 10/02/2021 09/03/2021 07/17/2021 06/13/2021 06/12/2021 05/02/2021 01/29/2021  ?Systolic BP [id number] A999333 123XX123 A999333 142 - 134  ?Diastolic BP 74 74 76 71 80 - 68  ?Wt. (Lbs) 188.8 197 - 198 199 197 197  ?BMI 36.87 38.47 - 38.67 38.86 38.47 38.47  ? ?Foot/eye exam completion dates 03/13/2010  ?Foot exam Order yes  ?Foot Form Completion -  ? ? ? ? ?Vitamin D deficiency ?Controlled, no med change ? ?Morbid obesity due to excess  calories ([place]) complicated by hbp/hyperlipidemia ? ?Patient re-educated about  the importance of commitment to a  minimum of 150 minutes of exercise per week as able. ? ?The importance of healthy food choices with portion control discussed, as well as eating regularly and within a 12 hour window most days. ?The need to choose "clean , green" food 50 to 75% of the time is discussed, as well as to make water the primary drink and set a goal of 64 ounces water daily. ? ?  ?Weight /BMI 10/02/2021 09/03/2021 06/13/2021  ?WEIGHT 188 lb 12.8 oz 197 lb 198 lb  ?HEIGHT 5\' 0"  5\' 0"  5\' 0"   ?BMI 36.87 kg/m2 38.47 kg/m2 38.67 kg/m2  ? ? ? ? ?Anxiety and depression ?Controlled, no change in medication ? ? ? ?

## 2021-10-08 NOTE — Assessment & Plan Note (Addendum)
Controlled , no med change °

## 2021-10-08 NOTE — Assessment & Plan Note (Signed)
Patient educated about the importance of limiting  Carbohydrate intake , the need to commit to daily physical activity for a minimum of 30 minutes , and to commit weight loss. ?The fact that changes in all these areas will reduce or eliminate all together the development of diabetes is stressed.  ?improived ? ?Diabetic Labs Latest Ref Rng & Units 10/02/2021 06/13/2021 12/12/2020 08/15/2020 04/13/2020  ?HbA1c 4.8 - 5.6 % 6.2(H) 6.3(H) 6.2(H) 6.2(H) -  ?Microalbumin 0.00 - 1.89 mg/dL - - - - -  ?Micro/Creat Ratio 0.0 - 30.0 mg/g - - - - -  ?Chol 100 - 199 mg/dL - 502 - 774 128  ?HDL >39 mg/dL - 75 - 73 82  ?Calc LDL 0 - 99 mg/dL - 87 - 79 83  ?Triglycerides 0 - 149 mg/dL - 78 - 58 63  ?Creatinine 0.57 - 1.00 mg/dL 7.86 7.67 2.09 4.70 -  ? ?BP/Weight 10/02/2021 09/03/2021 07/17/2021 06/13/2021 06/12/2021 05/02/2021 01/29/2021  ?Systolic BP 140 142 151 156 142 - 134  ?Diastolic BP 74 74 76 71 80 - 68  ?Wt. (Lbs) 188.8 197 - 198 199 197 197  ?BMI 36.87 38.47 - 38.67 38.86 38.47 38.47  ? ?Foot/eye exam completion dates 03/13/2010  ?Foot exam Order yes  ?Foot Form Completion -  ? ? ? ?

## 2021-10-08 NOTE — Assessment & Plan Note (Signed)
Hyperlipidemia:Low fat diet discussed and encouraged. ? ? ?Lipid Panel  ?Lab Results  ?Component Value Date  ? CHOL 176 06/13/2021  ? HDL 75 06/13/2021  ? LDLCALC 87 06/13/2021  ? TRIG 78 06/13/2021  ? CHOLHDL 2.3 06/13/2021  ? ?Controlled, no change in medication ? ? ? ?

## 2021-10-08 NOTE — Assessment & Plan Note (Signed)
Controlled, no change in medication  

## 2021-11-01 ENCOUNTER — Other Ambulatory Visit: Payer: Self-pay | Admitting: Family Medicine

## 2021-11-02 DIAGNOSIS — J984 Other disorders of lung: Secondary | ICD-10-CM | POA: Diagnosis not present

## 2021-11-19 ENCOUNTER — Telehealth: Payer: Self-pay | Admitting: Family Medicine

## 2021-11-19 NOTE — Telephone Encounter (Signed)
Patient needs refill on  ? ?omeprazole (PRILOSEC) 20 MG capsule  ? ?[facility]  ?

## 2021-11-20 ENCOUNTER — Other Ambulatory Visit: Payer: Self-pay

## 2021-11-20 MED ORDER — OMEPRAZOLE 20 MG PO CPDR: 20.0000 mg | DELAYED_RELEASE_CAPSULE | Freq: Every day | ORAL | 1 refills | Status: DC

## 2021-11-20 NOTE — Telephone Encounter (Signed)
Refilled

## 2021-11-21 ENCOUNTER — Other Ambulatory Visit: Payer: Self-pay | Admitting: Family Medicine

## 2021-12-02 DIAGNOSIS — J984 Other disorders of lung: Secondary | ICD-10-CM | POA: Diagnosis not present

## 2022-01-02 ENCOUNTER — Ambulatory Visit (INDEPENDENT_AMBULATORY_CARE_PROVIDER_SITE_OTHER): Payer: Medicare Other | Admitting: Family Medicine

## 2022-01-02 ENCOUNTER — Encounter: Payer: Self-pay | Admitting: Family Medicine

## 2022-01-02 VITALS — BP 130/78 | HR 76 | Resp 16 | Ht 60.0 in | Wt 190.4 lb

## 2022-01-02 DIAGNOSIS — I10 Essential (primary) hypertension: Secondary | ICD-10-CM

## 2022-01-02 DIAGNOSIS — R7303 Prediabetes: Secondary | ICD-10-CM

## 2022-01-02 DIAGNOSIS — E559 Vitamin D deficiency, unspecified: Secondary | ICD-10-CM

## 2022-01-02 DIAGNOSIS — Z1231 Encounter for screening mammogram for malignant neoplasm of breast: Secondary | ICD-10-CM

## 2022-01-02 DIAGNOSIS — J984 Other disorders of lung: Secondary | ICD-10-CM | POA: Diagnosis not present

## 2022-01-02 DIAGNOSIS — E785 Hyperlipidemia, unspecified: Secondary | ICD-10-CM

## 2022-01-02 NOTE — Patient Instructions (Addendum)
Annual exam first week in December, call if you need me sooner  Wellness visit needs to be scheduled if none in 12 months please  Fasting cBC, lipid, cmp and EGFR, TSh, hBA1c and vit D 5 days before Dec appointment  Please schedule mammogram at checkout  Please call and come for flu vaccine in sept/ October  No changes in medication  It is important that you exercise regularly at least 30 minutes 5 times a week. If you develop chest pain, have severe difficulty breathing, or feel very tired, stop exercising immediately and seek medical attention   Thanks for choosing [facility], we consider it a privelige to serve you.

## 2022-01-02 NOTE — Progress Notes (Unsigned)
   [name]     MRN: [id number]      DOB: 23-Mar-1946   HPI [name] is here for follow up and re-evaluation of chronic medical conditions, medication management and review of any available recent lab and radiology data.  Preventive health is updated, specifically  Cancer screening and Immunization.   Questions or concerns regarding consultations or procedures which the PT has had in the interim are  addressed. The PT denies any adverse reactions to current medications since the last visit.  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 Denies sinus pressure, nasal congestion, ear pain or sore throat. Denies chest congestion, productive cough or wheezing. Denies chest pains, palpitations and leg swelling Denies abdominal pain, nausea, vomiting,diarrhea or constipation.   Denies dysuria, frequency, hesitancy or incontinence. Denies joint pain, swelling and limitation in mobility. Denies headaches, seizures, numbness, or tingling. Denies depression, anxiety or insomnia. Denies skin break down or rash.   PE  BP (!) 103/56   Pulse 76   Resp 16   Ht 5' (1.524 m)   Wt 190 lb 6.4 oz (86.4 kg)   SpO2 92%   BMI 37.18 kg/m   Patient alert and oriented and in no cardiopulmonary distress.  HEENT: No facial asymmetry, EOMI,     Neck supple .  Chest: Clear to auscultation bilaterally.  CVS: S1, S2 no murmurs, no S3.Regular rate.  ABD: Soft non tender.   Ext: No edema  MS: Adequate ROM spine, shoulders, hips and knees.  Skin: Intact, no ulcerations or rash noted.  Psych: Good eye contact, normal affect. Memory intact not anxious or depressed appearing.  CNS: CN 2-12 intact, power,  normal throughout.no focal deficits noted.   Assessment & Plan  ***

## 2022-01-14 ENCOUNTER — Encounter: Payer: Self-pay | Admitting: Family Medicine

## 2022-01-24 ENCOUNTER — Other Ambulatory Visit: Payer: Self-pay

## 2022-01-24 ENCOUNTER — Telehealth: Payer: Self-pay

## 2022-01-24 MED ORDER — AMLODIPINE BESYLATE 10 MG PO TABS: 10.0000 mg | ORAL_TABLET | Freq: Every day | ORAL | 3 refills | Status: DC

## 2022-01-24 MED ORDER — GABAPENTIN 300 MG PO CAPS: 300.0000 mg | ORAL_CAPSULE | Freq: Every day | ORAL | 3 refills | Status: DC

## 2022-01-24 NOTE — Telephone Encounter (Signed)
Medication refills sent to mail order per patient request.

## 2022-01-24 NOTE — Telephone Encounter (Signed)
Patient called need med refill she is completely out since 3 weeks now NEED ASAP  amLODipine (NORVASC) 5 MG tablet   gabapentin (NEURONTIN) 300 MG capsule   Pharmacy  [facility]) [place], [place] - [address] [facility]  [address] [place] [address], [place] [address]  Phone:  [phone]  Fax:  [phone]

## 2022-01-28 ENCOUNTER — Other Ambulatory Visit: Payer: Self-pay | Admitting: Family Medicine

## 2022-02-01 DIAGNOSIS — J984 Other disorders of lung: Secondary | ICD-10-CM | POA: Diagnosis not present

## 2022-03-01 ENCOUNTER — Other Ambulatory Visit: Payer: Self-pay | Admitting: Family Medicine

## 2022-03-04 DIAGNOSIS — J984 Other disorders of lung: Secondary | ICD-10-CM | POA: Diagnosis not present

## 2022-03-06 ENCOUNTER — Encounter: Payer: Self-pay | Admitting: Internal Medicine

## 2022-03-06 ENCOUNTER — Ambulatory Visit: Payer: Medicare Other | Admitting: Internal Medicine

## 2022-03-06 ENCOUNTER — Other Ambulatory Visit: Payer: Self-pay

## 2022-03-06 DIAGNOSIS — J9611 Chronic respiratory failure with hypoxia: Secondary | ICD-10-CM | POA: Diagnosis not present

## 2022-03-06 DIAGNOSIS — I272 Pulmonary hypertension, unspecified: Secondary | ICD-10-CM

## 2022-03-06 NOTE — Assessment & Plan Note (Signed)
Echo  01/29/13  Left ventricle: Mild to moderate concentric hypertrophy.  Systolic function was normal. The estimated ejection  fraction was in the range of 55% to 60% with G 1 diastolic dysfunction   - Right ventricle: The cavity size was mildly dilated. RV  systolic pressure: 71mm Hg (S, est).   - Echo 07/17/21:  G 1 diastolic dysfunction/ mild lae/ nl RA with low nl RV fxn, could not do PAS  Well compensated on present rx / likely combination of WHO 2 and 3 so rx is control bp, lose wt and make sure 02 sats > 90% at all times  F/u can be yearly, sooner prn           Each maintenance medication was reviewed in detail including emphasizing most importantly the difference between maintenance and prns and under what circumstances the prns are to be triggered using an action plan format where appropriate.  Total time for H and P, chart review, counseling, reviewing 02  device(s) and generating customized AVS unique to this office visit / same day charting =21 min

## 2022-03-06 NOTE — Patient Instructions (Signed)
Stay as active as you can and working on weight loss   My office will be contacting you by phone for referral for overnight 02 study on 2 liters per min  - if you don't hear back from the providers or my office within 2 week splease call us back or notify us thru MyChart and we'll address it right away.   Please schedule a follow up visit in 12  months but call sooner if needed

## 2022-03-06 NOTE — Assessment & Plan Note (Signed)
Placed on 02 by Dr Juanetta Gosling for noct use with restrictive changes on pfts and very low erv c/w effects of body habitus  -  01/29/2021   Walked RA  approx  400 ft  @ slow to moderate pace  stopped due to  Min sob/ sats 95%      - ONO on 2lpm 01/29/2021  - PFT's   05/08/21   FVC  1.44 ( 78%) s obst and ERV  0.11 actual vol at wt = 199  with DLCO  7.02 (41%) corrects to 4.28 (92%)  for alv volume and FV curve no obstruction   - 06/12/2021   Walked on RA x  3  lap(s) =  approx 461ft  @ slow pace, stopped due to end of study c/o sob p 1st lap  with lowest 02 sats 94   - ONO on 2lpm 03/06/2022 >>>   Key is wt loss, maintaining sats > 90% noct / advised

## 2022-03-06 NOTE — Progress Notes (Addendum)
[name], female    DOB: 11/22/1945   MRN: [id number]   Brief patient profile:  80 yobf never smoker previously followed by Dr Clyda Hurdle 02/15/16  = 196   Pft report  05/2013  1. Spirometry shows a mild ventilatory defect without definite airflow     obstruction. 2. Lung volumes are mildly to moderately reduced. 3. DLCO is moderately reduced. 4. Airway resistance is slightly elevated. 5. There is improvement with inhaled bronchodilator, but it does not     reach the level of significance. 6. Arterial blood gas is normal.  PFTs 04/22/15 minimal curvature, no better p saba,  ERV  almost 0      History of Present Illness  01/29/2021  Pulmonary/ 1st office eval/ [name] / [facility]  No chief complaint on file. 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 Mb is 100 ft no hills and has to stop at house  Cough: none  Sleep: flat bed and 2 pillows  SABA use: not sure it helps  Overt hb since starting fosamax, unclear how long she's been on it  02 not keeping up with it at all  Covid vax x 4   Rec We will walk you today to make sure you have enough oxygen when you walk We will arrange for Overight 02 level while on 2lpm. Once we're sure we've corrected problem, then we will repeat your echo looking at your heart function.  Please schedule a follow up visit in 3 months  with pfts on return - call sooner if needed     06/12/2021  f/u ov/[facility]/[name] re: MO/doe/ hypoxemia/ PH maint on ravatio but not seeing cards   Chief Complaint  Patient presents with   Follow-up    SOB on exertion is about the same as last OV.   Dyspnea:  doing water aerobics Cough:  2-3 times a week a week wakes up with cough /  Sleeping: ok on 2 pillows  SABA use: none needed  02: 2lpm hs only  Covid status: vax x 4 Lung cancer  screening: never smoker  Rec We will try again to order overnight oximetry on 2lpm  We will schedule you for an echocardiogram To get the most out of exercise, you need to be continuously aware that you are short of breath, but never out of breath, for at least 30 minutes daily. Continue off fosamax for now Try prilosec (omeprazole) otc 20mg   Take 30-60 min before first meal of the day and Pepcid ac (famotidine) 20 mg one @  supper until return  GERD diet reviewed, bed blocks rec  Please schedule a follow up visit in 3 months but call sooner if needed  with all medications /inhalers/ solutions in hand so we can verify exactly what you are taking. This includes all medications from all doctors and over the counters    09/03/2021  f/u ov/[facility]/[name] re: MO/doe/PH maint on 02 hs   Chief Complaint  Patient presents with   Follow-up    SOB has not improved since last OV.  ONO not completed.  Dyspnea:  water aerobics 3 x  per week// no walking due  to back problems which limit her  Cough: none now  Sleeping: flat in in bed, 2 pillows  SABA use: none 02: 2lpm at hs,  ONO not checked yet  Covid status: vax x 4 Rec Ok to start the Prolia per Lodema Hong  I will make sure you have an overnight test as soon as possible while you are wearing 0xygen to make sure you have enough> not done as of 03/06/2022    03/06/2022  f/u ov/ office/[name] re: MO/PH/ noct hypoxemia maint on hs 02 2lpm/ no resp meds / no longer coughing   Chief Complaint  Patient presents with   Follow-up    Patient says she is always SOB  ONO was never delivered by dme    Dyspnea:  water aerobics x one hour x 3 x weekly / pushes buggy @[employer]  / needs hc parking - uses husband's car/[employer] sticker  now  Cough: none  Sleeping: flat bed/ 2 pillows  SABA use: none     No obvious day to day or daytime variability or assoc excess/ purulent sputum or mucus plugs or hemoptysis or cp or chest tightness, subjective wheeze  or overt sinus or hb symptoms.   Sleeping  without nocturnal  or early am exacerbation  of respiratory  c/o's or need for noct saba. Also denies any obvious fluctuation of symptoms with weather or environmental changes or other aggravating or alleviating factors except as outlined above   No unusual exposure hx or h/o childhood pna/ asthma or knowledge of premature birth.  Current Allergies, Complete Past Medical History, Past Surgical History, Family History, and Social History were reviewed in record.  ROS  The following are not active complaints unless bolded Hoarseness, sore throat, dysphagia, dental problems, itching, sneezing,  nasal congestion or discharge of excess mucus or purulent secretions, ear ache,   fever, chills, sweats, unintended wt loss or wt gain, classically pleuritic or exertional cp,  orthopnea pnd or arm/hand swelling  or leg swelling, presyncope, palpitations, abdominal pain, anorexia, nausea, vomiting, diarrhea  or change in bowel habits or change in bladder habits, change in stools or change in urine, dysuria, hematuria,  rash, arthralgias, visual complaints, headache, numbness, weakness or ataxia or problems with walking or coordination,  change in mood or  memory.        Current Meds  Medication Sig   amLODipine (NORVASC) 10 MG tablet Take 1 tablet (10 mg total) by mouth daily.   amLODipine (NORVASC) 5 MG tablet TAKE 1 TABLET BY MOUTH AT  BEDTIME   aspirin EC 81 MG tablet Take 81 mg by mouth daily. Swallow whole.   atorvastatin (LIPITOR) 20 MG tablet TAKE 1 TABLET BY MOUTH EVERY DAY   BIOTIN PO Take by mouth.   Cholecalciferol (VITAMIN D3) 125 MCG (5000 UT) CAPS Take 1 capsule (5,000 Units total) by mouth daily.   clotrimazole-betamethasone (LOTRISONE) cream Apply cream twice daily to rash in groin for 10 days, then as needed   famotidine (PEPCID) 10 MG tablet Take 10 mg by mouth 2 (two) times daily.   fenoprofen (NALFON) 600 MG  TABS tablet Take 600 mg by mouth.   gabapentin (NEURONTIN) 300 MG capsule Take 1 capsule (300 mg total) by mouth at bedtime.   Hydrocortisone (GERHARDT'S BUTT CREAM) CREA Apply 1 application topically 3 (three) times daily.   losartan-hydrochlorothiazide (HYZAAR) 100-12.5 MG tablet Take 1 tablet by mouth daily.   metoprolol succinate (TOPROL-XL) 50 MG 24 hr tablet TAKE 1 TABLET BY MOUTH  DAILY WITH OR IMMEDIATELY  FOLLOWING A MEAL   olopatadine (PATANOL) 0.1 % ophthalmic solution  INSTILL 1 DROP INTO BOTH EYES TWICE A DAY   omeprazole (PRILOSEC) 20 MG capsule TAKE 1 CAPSULE BY MOUTH DAILY   pyridOXINE (VITAMIN B-6) 100 MG tablet Take 200 mg by mouth daily.   sertraline (ZOLOFT) 25 MG tablet Take 1 tablet (25 mg total) by mouth daily.             Past Medical History:  Diagnosis Date   Asthma    Back pain    Depression    Diverticulosis of colon    GERD (gastroesophageal reflux disease)    Hyperlipidemia    Hypertension    Obesity    Osteoarthrosis, unspecified whether generalized or localized, lower leg    bilateral knees    Pulmonary hypertension (HCC)      Objective:    Wts  03/06/2022       193  09/03/2021       197   06/12/21 199 lb (90.3 kg)  05/02/21 197 lb (89.4 kg)  01/29/21 197 lb (89.4 kg)    Vital signs reviewed  03/06/2022  - Note at rest 02 sats  97% on RA   General appearance:    amb obese bf nad   HEENT : Oropharynx  clear     Nasal turbinates nl    NECK :  without  apparent JVD/ palpable Nodes/TM    LUNGS: no acc muscle use,  Nl contour chest which is clear to A and P bilaterally without cough on insp or exp maneuvers   CV:  RRR  no s3 or murmur or increase in P2, and no edema   ABD: obese soft and nontender with nl inspiratory excursion in the supine position. No bruits or organomegaly appreciated   MS:  Nl gait/ ext warm without deformities Or obvious joint restrictions  calf tenderness, cyanosis or clubbing    SKIN: warm and dry without lesions     NEURO:  alert, approp, nl sensorium with  no motor or cerebellar deficits apparent.            Assessment

## 2022-03-13 ENCOUNTER — Ambulatory Visit (INDEPENDENT_AMBULATORY_CARE_PROVIDER_SITE_OTHER): Payer: Medicare Other | Admitting: Family Medicine

## 2022-03-13 ENCOUNTER — Encounter: Payer: Self-pay | Admitting: Family Medicine

## 2022-03-13 VITALS — BP 160/90 | HR 90 | Ht 60.0 in | Wt 194.0 lb

## 2022-03-13 DIAGNOSIS — I10 Essential (primary) hypertension: Secondary | ICD-10-CM | POA: Diagnosis not present

## 2022-03-13 DIAGNOSIS — E785 Hyperlipidemia, unspecified: Secondary | ICD-10-CM

## 2022-03-13 DIAGNOSIS — I509 Heart failure, unspecified: Secondary | ICD-10-CM | POA: Diagnosis not present

## 2022-03-13 MED ORDER — CLONIDINE HCL 0.2 MG PO TABS: ORAL_TABLET | ORAL | 2 refills | Status: DC

## 2022-03-13 NOTE — Patient Instructions (Signed)
F/u first week in October, flu vaccine at visit, re eval bP, CALL IF YOU NEED ME SOONER  Stop AMLODIPINE 5 MG  NEW IS CLONIDINE 0.2 MG ONE AT BEDTIME  TAKE TYLENOL 500 MG ONE TWICE DAILY  FOR BACK PAIN  TAKE MELOXICAM ONRE 3 TIMES WEEKLY IF NEEDED  PLEASE GET LABS ORDERED ALREADY 3 TO 5 DAYS BEFORE OCTOBER VISIT  NURSE PLEASE REMOVE VIT D ORDER ED IN JUNE SHE HAS ALREADY HAD ONE THIS YEAR  NURSE PLEASE GIVE HER ORDER FOR TDAP TO GET AT HER PHARMACY IF COVERED  Thanks for choosing [facility], we consider it a privelige to serve you.

## 2022-03-13 NOTE — Progress Notes (Signed)
AI SONNENFELD     MRN: [id number]      DOB: March 08, 1946   HPI [name] is here for follow up and re-evaluation of uncontrolled blood pressure, also concerned with medication dosing in particular amlodipine. .  Preventive health is updated, specifically  Cancer screening and Immunization.   Questions or concerns regarding consultations or procedures which the PT has had in the interim are  addressed. The PT denies any adverse reactions to current medications since the last visit.     ROS Denies recent fever or chills. Denies sinus pressure, nasal congestion, ear pain or sore throat. Denies chest congestion, productive cough or wheezing. Denies chest pains, palpitations and leg swelling Denies abdominal pain, nausea, vomiting,diarrhea or constipation.   Denies dysuria, frequency, hesitancy or incontinence. Chronic  joint pain, swelling and limitation in mobility. Denies headaches, seizures, numbness, or tingling. Denies uncontrolled  depression, anxiety or insomnia. Denies skin break down or rash.   PE  BP (!) 160/88 (BP Location: Right Arm, Cuff Size: Normal)   Pulse 90   Ht 5' (1.524 m)   Wt 194 lb (88 kg)   SpO2 93%   BMI 37.89 kg/m   Patient alert and oriented and in no cardiopulmonary distress.  HEENT: No facial asymmetry, EOMI,     Neck supple .  Chest: Clear to auscultation bilaterally.  CVS: S1, S2 no murmurs, no S3.Regular rate.  ABD: Soft non tender.   Ext: No edema  MS: decreased  ROM spine, shoulders, hips and knees.  Skin: Intact, no ulcerations or rash noted.  Psych: Good eye contact, normal affect. Memory intact not anxious or depressed appearing.  CNS: CN 2-12 intact, power,  normal throughout.no focal deficits noted.   Assessment & Plan  Essential hypertension Uncontrolled Inc amlodipine to 10 mg daily and add clonidine 0.2 mg daily DASH diet and commitment to daily physical activity for a minimum of 30 minutes discussed and encouraged,  as a part of hypertension management. The importance of attaining a healthy weight is also discussed.     03/13/2022    1:53 PM 03/13/2022    1:36 PM 03/13/2022    1:27 PM 03/06/2022    1:51 PM 01/02/2022    4:25 PM 01/02/2022    4:12 PM 10/02/2021    4:30 PM  BP/Weight  Systolic BP 160 160 165 122 130 103 140  Diastolic BP 90 88 85 64 78 56 74  Wt. (Lbs)   194 193  190.4   BMI   37.89 kg/m2 37.69 kg/m2  37.18 kg/m2        Morbid obesity due to excess calories (HCC) complicated by hbp/hyperlipidemia  Patient re-educated about  the importance of commitment to a  minimum of 150 minutes of exercise per week as able.  The importance of healthy food choices with portion control discussed, as well as eating regularly and within a 12 hour window most days. The need to choose "clean , green" food 50 to 75% of the time is discussed, as well as to make water the primary drink and set a goal of 64 ounces water daily.       03/13/2022    1:27 PM 03/06/2022    1:51 PM 01/02/2022    4:12 PM  Weight /BMI  Weight 194 lb 193 lb 190 lb 6.4 oz  Height 5' (1.524 m) 5' (1.524 m) 5' (1.524 m)  BMI 37.89 kg/m2 37.69 kg/m2 37.18 kg/m2      Hyperlipidemia LDL  goal <100 Hyperlipidemia:Low fat diet discussed and encouraged.   Lipid Panel  Lab Results  Component Value Date   CHOL 176 06/13/2021   HDL 75 06/13/2021   LDLCALC 87 06/13/2021   TRIG 78 06/13/2021   CHOLHDL 2.3 06/13/2021     Updated lab needed at/ before next visit.

## 2022-03-13 NOTE — Addendum Note (Signed)
Addended by: Abner Greenspan on: 03/13/2022 02:49 PM   Modules accepted: Orders

## 2022-03-25 ENCOUNTER — Encounter: Payer: Self-pay | Admitting: Family Medicine

## 2022-03-25 NOTE — Assessment & Plan Note (Signed)
Hyperlipidemia:Low fat diet discussed and encouraged.   Lipid Panel  Lab Results  Component Value Date   CHOL 176 06/13/2021   HDL 75 06/13/2021   LDLCALC 87 06/13/2021   TRIG 78 06/13/2021   CHOLHDL 2.3 06/13/2021  Updated lab needed at/ before next visit.     

## 2022-03-25 NOTE — Assessment & Plan Note (Signed)
  Patient re-educated about  the importance of commitment to a  minimum of 150 minutes of exercise per week as able.  The importance of healthy food choices with portion control discussed, as well as eating regularly and within a 12 hour window most days. The need to choose "clean , green" food 50 to 75% of the time is discussed, as well as to make water the primary drink and set a goal of 64 ounces water daily.       03/13/2022    1:27 PM 03/06/2022    1:51 PM 01/02/2022    4:12 PM  Weight /BMI  Weight 194 lb 193 lb 190 lb 6.4 oz  Height 5' (1.524 m) 5' (1.524 m) 5' (1.524 m)  BMI 37.89 kg/m2 37.69 kg/m2 37.18 kg/m2

## 2022-03-25 NOTE — Assessment & Plan Note (Signed)
Uncontrolled Inc amlodipine to 10 mg daily and add clonidine 0.2 mg daily DASH diet and commitment to daily physical activity for a minimum of 30 minutes discussed and encouraged, as a part of hypertension management. The importance of attaining a healthy weight is also discussed.     03/13/2022    1:53 PM 03/13/2022    1:36 PM 03/13/2022    1:27 PM 03/06/2022    1:51 PM 01/02/2022    4:25 PM 01/02/2022    4:12 PM 10/02/2021    4:30 PM  BP/Weight  Systolic BP 160 160 165 122 130 103 140  Diastolic BP 90 88 85 64 78 56 74  Wt. (Lbs)   194 193  190.4   BMI   37.89 kg/m2 37.69 kg/m2  37.18 kg/m2

## 2022-04-02 ENCOUNTER — Other Ambulatory Visit: Payer: Self-pay | Admitting: Family Medicine

## 2022-04-04 DIAGNOSIS — J984 Other disorders of lung: Secondary | ICD-10-CM | POA: Diagnosis not present

## 2022-04-22 ENCOUNTER — Other Ambulatory Visit: Payer: Self-pay | Admitting: Family Medicine

## 2022-04-25 ENCOUNTER — Ambulatory Visit (INDEPENDENT_AMBULATORY_CARE_PROVIDER_SITE_OTHER): Payer: Medicare Other | Admitting: Family Medicine

## 2022-04-25 ENCOUNTER — Encounter: Payer: Self-pay | Admitting: Family Medicine

## 2022-04-25 ENCOUNTER — Telehealth: Payer: Self-pay | Admitting: Internal Medicine

## 2022-04-25 VITALS — BP 138/70 | Ht 60.0 in | Wt 188.0 lb

## 2022-04-25 DIAGNOSIS — R7303 Prediabetes: Secondary | ICD-10-CM

## 2022-04-25 DIAGNOSIS — F32A Depression, unspecified: Secondary | ICD-10-CM

## 2022-04-25 DIAGNOSIS — E785 Hyperlipidemia, unspecified: Secondary | ICD-10-CM | POA: Diagnosis not present

## 2022-04-25 DIAGNOSIS — Z23 Encounter for immunization: Secondary | ICD-10-CM | POA: Diagnosis not present

## 2022-04-25 DIAGNOSIS — I10 Essential (primary) hypertension: Secondary | ICD-10-CM | POA: Diagnosis not present

## 2022-04-25 DIAGNOSIS — F419 Anxiety disorder, unspecified: Secondary | ICD-10-CM

## 2022-04-25 NOTE — Patient Instructions (Addendum)
Annual exam in [place] as before, call if you need me sooner  Please get fasting labs ordered the end November  Flu vaccine today  .It is important that you exercise regularly at least 30 minutes 5 times a week. If you develop chest pain, have severe difficulty breathing, or feel very tired, stop exercising immediately and seek medical attention  Think about what you will eat, plan ahead. Choose " clean, green, fresh or frozen" over canned, processed or packaged foods which are more sugary, salty and fatty. 70 to 75% of food eaten should be vegetables and fruit. Three meals at set times with snacks allowed between meals, but they must be fruit or vegetables. Aim to eat over a 12 hour period , example 7 am to 7 pm, and STOP after  your last meal of the day. Drink water,generally about 64 ounces per day, no other drink is as healthy. Fruit juice is best enjoyed in a healthy way, by EATING the fruit. Thanks for choosing [facility], we consider it a privelige to serve you.  BP meds are amlodipine 10 mg, Clonidine 0.2 mg. , metoprolol 50 mg one daily and losartan/ hctz 100/12.5 ONE DAILY  Thanks for choosing [facility], we consider it a privelige to serve you.

## 2022-04-25 NOTE — Telephone Encounter (Signed)
"-   ONO on 2lpm  03/12/22 > no desats > no change rx "  Called and spoke to [place] and notified him of patients results. Nothing further needed at this time

## 2022-04-29 ENCOUNTER — Encounter: Payer: Self-pay | Admitting: Family Medicine

## 2022-04-29 NOTE — Assessment & Plan Note (Signed)
Controlled, no change in medication DASH diet and commitment to daily physical activity for a minimum of 30 minutes discussed and encouraged, as a part of hypertension management. The importance of attaining a healthy weight is also discussed.     04/25/2022    4:11 PM 04/25/2022    3:40 PM 04/25/2022    3:39 PM 03/13/2022    1:53 PM 03/13/2022    1:36 PM 03/13/2022    1:27 PM 03/06/2022    1:51 PM  BP/Weight  Systolic BP 729 021 115 520 802 233 612  Diastolic BP 70 72 70 90 88 85 64  Wt. (Lbs)   188   194 193  BMI   36.72 kg/m2   37.89 kg/m2 37.69 kg/m2

## 2022-04-29 NOTE — Assessment & Plan Note (Signed)
Hyperlipidemia:Low fat diet discussed and encouraged.   Lipid Panel  Lab Results  Component Value Date   CHOL 176 06/13/2021   HDL 75 06/13/2021   LDLCALC 87 06/13/2021   TRIG 78 06/13/2021   CHOLHDL 2.3 06/13/2021  Updated lab needed at/ before next visit.

## 2022-04-29 NOTE — Progress Notes (Signed)
[name]     MRN: [id number]      DOB: 03-07-1946   HPI [name] is here for follow up and re-evaluation of chronic medical conditions, medication management and review of any available recent lab and radiology data.  Preventive health is updated, specifically  Cancer screening and Immunization.   Questions or concerns regarding consultations or procedures which the PT has had in the interim are  addressed. The PT denies any adverse reactions to current medications since the last visit.  There are no new concerns.  There are no specific complaints   ROS Denies recent fever or chills. Denies sinus pressure, nasal congestion, ear pain or sore throat. Denies chest congestion, productive cough or wheezing. Denies chest pains, palpitations and leg swelling Denies abdominal pain, nausea, vomiting,diarrhea or constipation.   Denies dysuria, frequency, hesitancy or incontinence. Chronic  joint pain, swelling and limitation in mobility. Denies headaches, seizures, numbness, or tingling. Denies uncontrolled  depression, anxiety or insomnia. Denies skin break down or rash.   PE  BP 138/70   Ht 5' (1.524 m)   Wt 188 lb (85.3 kg)   SpO2 92%   BMI 36.72 kg/m   Patient alert and oriented and in no cardiopulmonary distress.  HEENT: No facial asymmetry, EOMI,     Neck supple .  Chest: Clear to auscultation bilaterally.  CVS: S1, S2 no murmurs, no S3.Regular rate.  ABD: Soft non tender.   Ext: No edema  MS: Decreased  ROM spine, shoulders, hips and knees.  Skin: Intact, no ulcerations or rash noted.  Psych: Good eye contact, normal affect. Memory intact not anxious or depressed appearing.  CNS: CN 2-12 intact, power,  normal throughout.no focal deficits noted.   Assessment & Plan  Anxiety and depression Controlled, no change in medication   Essential hypertension Controlled, no change in medication DASH diet and commitment to daily physical activity for a  minimum of 30 minutes discussed and encouraged, as a part of hypertension management. The importance of attaining a healthy weight is also discussed.     04/25/2022    4:11 PM 04/25/2022    3:40 PM 04/25/2022    3:39 PM 03/13/2022    1:53 PM 03/13/2022    1:36 PM 03/13/2022    1:27 PM 03/06/2022    1:51 PM  BP/Weight  Systolic BP 528 413 244 010 272 536 644  Diastolic BP 70 72 70 90 88 85 64  Wt. (Lbs)   188   194 193  BMI   36.72 kg/m2   37.89 kg/m2 37.69 kg/m2       Morbid obesity due to excess calories (HCC) complicated by hbp/hyperlipidemia  Patient re-educated about  the importance of commitment to a  minimum of 150 minutes of exercise per week as able.  The importance of healthy food choices with portion control discussed, as well as eating regularly and within a 12 hour window most days. The need to choose "clean , green" food 50 to 75% of the time is discussed, as well as to make water the primary drink and set a goal of 64 ounces water daily.       04/25/2022    3:39 PM 03/13/2022    1:27 PM 03/06/2022    1:51 PM  Weight /BMI  Weight 188 lb 194 lb 193 lb  Height 5' (1.524 m) 5' (1.524 m) 5' (1.524 m)  BMI 36.72 kg/m2 37.89 kg/m2 37.69 kg/m2      Prediabetes  Patient educated about the importance of limiting  Carbohydrate intake , the need to commit to daily physical activity for a minimum of 30 minutes , and to commit weight loss. The fact that changes in all these areas will reduce or eliminate all together the development of diabetes is stressed.  Updated lab needed at/ before next visit.      Latest Ref Rng & Units 10/02/2021    4:43 PM 06/13/2021    2:54 PM 12/12/2020    4:04 PM 08/15/2020    9:31 AM 04/13/2020    3:55 PM  Diabetic Labs  HbA1c 4.8 - 5.6 % 6.2  6.3  6.2  6.2    Chol 100 - 199 mg/dL  967   893  810   HDL >17 mg/dL  75   73  82   Calc LDL 0 - 99 mg/dL  87   79  83   Triglycerides 0 - 149 mg/dL  78   58  63   Creatinine 0.57 - 1.00 mg/dL 5.10   2.58  5.27  7.82        04/25/2022    4:11 PM 04/25/2022    3:40 PM 04/25/2022    3:39 PM 03/13/2022    1:53 PM 03/13/2022    1:36 PM 03/13/2022    1:27 PM 03/06/2022    1:51 PM  BP/Weight  Systolic BP 138 140 140 160 160 165 122  Diastolic BP 70 72 70 90 88 85 64  Wt. (Lbs)   188   194 193  BMI   36.72 kg/m2   37.89 kg/m2 37.69 kg/m2      Latest Ref Rng & Units 09/18/2021   12:00 AM 03/13/2010   12:00 AM  Foot/eye exam completion dates  Eye Exam No Retinopathy No Retinopathy       Foot exam Order   yes      This result is from an external source.      Hyperlipidemia LDL goal <100 Hyperlipidemia:Low fat diet discussed and encouraged.   Lipid Panel  Lab Results  Component Value Date   CHOL 176 06/13/2021   HDL 75 06/13/2021   LDLCALC 87 06/13/2021   TRIG 78 06/13/2021   CHOLHDL 2.3 06/13/2021  Updated lab needed at/ before next visit.

## 2022-04-29 NOTE — Assessment & Plan Note (Signed)
Controlled, no change in medication  

## 2022-04-29 NOTE — Assessment & Plan Note (Signed)
  Patient re-educated about  the importance of commitment to a  minimum of 150 minutes of exercise per week as able.  The importance of healthy food choices with portion control discussed, as well as eating regularly and within a 12 hour window most days. The need to choose "clean , green" food 50 to 75% of the time is discussed, as well as to make water the primary drink and set a goal of 64 ounces water daily.       04/25/2022    3:39 PM 03/13/2022    1:27 PM 03/06/2022    1:51 PM  Weight /BMI  Weight 188 lb 194 lb 193 lb  Height 5' (1.524 m) 5' (1.524 m) 5' (1.524 m)  BMI 36.72 kg/m2 37.89 kg/m2 37.69 kg/m2

## 2022-04-29 NOTE — Assessment & Plan Note (Addendum)
Patient educated about the importance of limiting  Carbohydrate intake , the need to commit to daily physical activity for a minimum of 30 minutes , and to commit weight loss. The fact that changes in all these areas will reduce or eliminate all together the development of diabetes is stressed.  Updated lab needed at/ before next visit.      Latest Ref Rng & Units 10/02/2021    4:43 PM 06/13/2021    2:54 PM 12/12/2020    4:04 PM 08/15/2020    9:31 AM 04/13/2020    3:55 PM  Diabetic Labs  HbA1c 4.8 - 5.6 % 6.2  6.3  6.2  6.2    Chol 100 - 199 mg/dL  176   164  177   HDL >39 mg/dL  75   73  82   Calc LDL 0 - 99 mg/dL  87   79  83   Triglycerides 0 - 149 mg/dL  78   58  63   Creatinine 0.57 - 1.00 mg/dL 0.69  0.75  0.86  0.77        04/25/2022    4:11 PM 04/25/2022    3:40 PM 04/25/2022    3:39 PM 03/13/2022    1:53 PM 03/13/2022    1:36 PM 03/13/2022    1:27 PM 03/06/2022    1:51 PM  BP/Weight  Systolic BP 403 474 259 563 875 643 329  Diastolic BP 70 72 70 90 88 85 64  Wt. (Lbs)   188   194 193  BMI   36.72 kg/m2   37.89 kg/m2 37.69 kg/m2      Latest Ref Rng & Units 09/18/2021   12:00 AM 03/13/2010   12:00 AM  Foot/eye exam completion dates  Eye Exam No Retinopathy No Retinopathy       Foot exam Order   yes      This result is from an external source.

## 2022-04-30 ENCOUNTER — Other Ambulatory Visit: Payer: Self-pay

## 2022-04-30 ENCOUNTER — Telehealth: Payer: Self-pay | Admitting: Family Medicine

## 2022-04-30 MED ORDER — AMLODIPINE BESYLATE 10 MG PO TABS: 10.0000 mg | ORAL_TABLET | Freq: Every day | ORAL | 3 refills | Status: DC

## 2022-04-30 MED ORDER — ATORVASTATIN CALCIUM 20 MG PO TABS: 20.0000 mg | ORAL_TABLET | Freq: Every day | ORAL | 3 refills | Status: DC

## 2022-04-30 MED ORDER — SERTRALINE HCL 25 MG PO TABS: 25.0000 mg | ORAL_TABLET | Freq: Every day | ORAL | 3 refills | Status: DC

## 2022-04-30 MED ORDER — GABAPENTIN 300 MG PO CAPS: 300.0000 mg | ORAL_CAPSULE | Freq: Every day | ORAL | 3 refills | Status: DC

## 2022-04-30 MED ORDER — METOPROLOL SUCCINATE ER 50 MG PO TB24: ORAL_TABLET | ORAL | 2 refills | Status: DC

## 2022-04-30 MED ORDER — CLONIDINE HCL 0.2 MG PO TABS: ORAL_TABLET | ORAL | 2 refills | Status: DC

## 2022-04-30 MED ORDER — LOSARTAN POTASSIUM-HCTZ 100-12.5 MG PO TABS: 1.0000 | ORAL_TABLET | Freq: Every day | ORAL | 3 refills | Status: DC

## 2022-04-30 MED ORDER — OMEPRAZOLE 20 MG PO CPDR: 20.0000 mg | DELAYED_RELEASE_CAPSULE | Freq: Every day | ORAL | 2 refills | Status: DC

## 2022-04-30 NOTE — Telephone Encounter (Signed)
Refills sent for all maintenance medications

## 2022-04-30 NOTE — Telephone Encounter (Signed)
Pt called stating she is needing refill on all of her medications. She is completely out. Can you please refill?   All medications per patient    [facility] [place]

## 2022-05-04 DIAGNOSIS — J984 Other disorders of lung: Secondary | ICD-10-CM | POA: Diagnosis not present

## 2022-05-06 ENCOUNTER — Ambulatory Visit (INDEPENDENT_AMBULATORY_CARE_PROVIDER_SITE_OTHER): Payer: Medicare Other | Admitting: Internal Medicine

## 2022-05-06 ENCOUNTER — Encounter: Payer: Self-pay | Admitting: Internal Medicine

## 2022-05-06 DIAGNOSIS — Z0000 Encounter for general adult medical examination without abnormal findings: Secondary | ICD-10-CM | POA: Diagnosis not present

## 2022-05-06 NOTE — Progress Notes (Signed)
Subjective:  This is a telephone encounter between [name] and [name] on 05/06/2022 for AWV. The visit was conducted with the patient located at home and [name] at [facility]. The patient's identity was confirmed using their DOB and current address. The patient has consented to being evaluated through a telephone encounter and understands the associated risks (an examination cannot be done and the patient may need to come in for an appointment) / benefits (allows the patient to remain at home, decreasing exposure to coronavirus).      [name] is a 76 y.o. female who presents for Medicare Annual (Subsequent) preventive examination.  Review of Systems    Review of Systems  All other systems reviewed and are negative.     Objective:    Today's Vitals   05/06/22 0843  PainSc: 7    There is no height or weight on file to calculate BMI.     05/06/2022    8:55 AM 05/02/2021    4:24 PM 01/26/2021    9:11 PM 09/06/2020    8:58 PM 05/01/2020    9:25 AM 03/19/2019    9:44 AM 08/28/2018    4:48 PM  Advanced Directives  Does Patient Have a Medical Advance Directive? Yes No No No No No No  Does patient want to make changes to medical advance directive? Yes (MAU/Ambulatory/Procedural Areas - Information given) No - Patient declined       Would patient like information on creating a medical advance directive?   No - Patient declined Yes (MAU/Ambulatory/Procedural Areas - Information given) No - Patient declined No - Patient declined No - Patient declined    Current Medications (verified) Outpatient Encounter Medications as of 05/06/2022  Medication Sig   amLODipine (NORVASC) 10 MG tablet Take 1 tablet (10 mg total) by mouth daily.   aspirin EC 81 MG tablet Take 81 mg by mouth daily. Swallow whole.   atorvastatin (LIPITOR) 20 MG tablet Take 1 tablet (20 mg total) by mouth daily.   BIOTIN PO Take by mouth.   Cholecalciferol (VITAMIN D3) 125 MCG (5000 UT) CAPS Take 1  capsule (5,000 Units total) by mouth daily.   cloNIDine (CATAPRES) 0.2 MG tablet Take one tablet by mouth at 10 ;30 pm each night   clotrimazole-betamethasone (LOTRISONE) cream Apply cream twice daily to rash in groin for 10 days, then as needed   famotidine (PEPCID) 10 MG tablet Take 10 mg by mouth 2 (two) times daily.   gabapentin (NEURONTIN) 300 MG capsule Take 1 capsule (300 mg total) by mouth at bedtime.   Hydrocortisone (GERHARDT'S BUTT CREAM) CREA Apply 1 application topically 3 (three) times daily.   losartan-hydrochlorothiazide (HYZAAR) 100-12.5 MG tablet Take 1 tablet by mouth daily.   meloxicam (MOBIC) 15 MG tablet Take 15 mg by mouth daily.   metoprolol succinate (TOPROL-XL) 50 MG 24 hr tablet TAKE 1 TABLET BY MOUTH  DAILY WITH OR IMMEDIATELY  FOLLOWING A MEAL   olopatadine (PATANOL) 0.1 % ophthalmic solution INSTILL 1 DROP INTO BOTH EYES TWICE A DAY   omeprazole (PRILOSEC) 20 MG capsule Take 1 capsule (20 mg total) by mouth daily.   pyridOXINE (VITAMIN B-6) 100 MG tablet Take 200 mg by mouth daily.   sertraline (ZOLOFT) 25 MG tablet Take 1 tablet (25 mg total) by mouth daily.   [DISCONTINUED] fenoprofen (NALFON) 600 MG TABS tablet Take 600 mg by mouth.   No facility-administered encounter medications on file as of 05/06/2022.  Allergies (verified) Patient has no known allergies.   History: Past Medical History:  Diagnosis Date   Asthma    Back pain    Depression    Diverticulosis of colon    GERD (gastroesophageal reflux disease)    Hyperlipidemia    Hypertension    Obesity    Osteoarthrosis, unspecified whether generalized or localized, lower leg    bilateral knees    Pulmonary hypertension (HCC)    Past Surgical History:  Procedure Laterality Date   ABDOMINAL HYSTERECTOMY     COLONOSCOPY  2005   Dr. Jerolyn ShinLeroy Smith:numerous large scattered diverticula   COLONOSCOPY N/A 11/15/2013   Dr. Darrick PennaFields: moderate diverticula, small internal hemorrhoids, redudant colon. Next  TCS in 2025 with overtube.    ESOPHAGOGASTRODUODENOSCOPY (EGD) WITH ESOPHAGEAL DILATION N/A 11/15/2013   Dr. 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 moderate erosive gastritis, negative H.pylori   TOTAL KNEE ARTHROPLASTY  06/01/04   left / Dr. Romeo AppleHArrison   TOTAL KNEE ARTHROPLASTY  11/29/03   right / Dr. Romeo AppleHarrison   TUBAL LIGATION     Family History  Problem Relation Age of Onset   Diabetes Mother    Hypertension Mother    Heart disease Mother    Aneurysm Father    Diabetes Father    Hypertension Brother    Diabetes Brother    Diabetes Brother    Hypertension Brother    Hypertension Son    Breast cancer Maternal Aunt    Colon cancer Maternal Uncle    Social History   Socioeconomic History   Marital status: Married    Spouse name: Not on file   Number of children: 2   Years of education: 12   Highest education level: Not on file  Occupational History   Occupation: retired     Associate Professormployer: UNEMPLOYED  Tobacco Use   Smoking status: Never   Smokeless tobacco: Never  Vaping Use   Vaping Use: Never used  Substance and Sexual Activity   Alcohol use: No   Drug use: No   Sexual activity: Never    Birth control/protection: Surgical  Other Topics Concern   Not on file  Social History Narrative   Not on file   Social Determinants of Health   Financial Resource Strain: Low Risk  (05/02/2021)   Overall Financial Resource Strain (CARDIA)    Difficulty of Paying Living Expenses: Not hard at all  Food Insecurity: No Food Insecurity (05/02/2021)   Hunger Vital Sign    Worried About Running Out of Food in the Last Year: Never true    Ran Out of Food in the Last Year: Never true  Transportation Needs: No Transportation Needs (05/02/2021)   PRAPARE - Administrator, Civil ServiceTransportation    Lack of Transportation (Medical): No    Lack of Transportation (Non-Medical): No  Physical Activity: Sufficiently Active (05/02/2021)   Exercise Vital Sign    Days of Exercise per Week: 3 days    Minutes of  Exercise per Session: 60 min  Stress: No Stress Concern Present (05/02/2021)   [employer] - Occupational Stress Questionnaire    Feeling of Stress : Not at all  Social Connections: Socially Integrated (05/02/2021)   Social Connection and Isolation Panel [NHANES]    Frequency of Communication with Friends and Family: More than three times a week    Frequency of Social Gatherings with Friends and Family: More than three times a week    Attends Religious Services: More than 4 times per year  Active Member of Clubs or Organizations: Yes    Attends Engineer, structural: More than 4 times per year    Marital Status: Married    Tobacco Counseling Counseling given: Not Answered   Clinical Intake:  Pre-visit preparation completed: Yes  Pain : 0-10 Pain Score: 7  Pain Type: Chronic pain Pain Location: Back Pain Orientation: Lower Pain Descriptors / Indicators: Throbbing, Aching Pain Onset: More than a month ago Pain Frequency: Constant        How often do you need to have someone help you when you read instructions, pamphlets, or other written materials from your doctor or pharmacy?: 4 - Often What is the last grade level you completed in school?: 12 grade  Diabetic?No         Activities of Daily Living     No data to display          Patient Care Team: Kerri Perches, MD as PCP - General Fields, Darleene Cleaver, MD (Inactive) as Consulting Physician (Gastroenterology)  Indicate any recent Medical Services you may have received from other than [facility] providers in the past year (date may be approximate).     Assessment:   This is a routine wellness examination for [name].  Hearing/Vision screen No results found.  Dietary issues and exercise activities discussed:     Goals Addressed   None    Depression Screen    04/25/2022    3:39 PM 03/13/2022    1:36 PM 05/02/2021    4:22 PM 08/14/2020    2:49 PM 05/01/2020    9:25  AM 05/01/2020    9:20 AM 02/16/2020    9:29 AM  PHQ 2/9 Scores  PHQ - 2 Score 0 0 0 1 0 0 0  PHQ- 9 Score       1    Fall Risk    05/06/2022    9:00 AM 04/25/2022    3:39 PM 03/13/2022    1:36 PM 01/02/2022    4:13 PM 06/13/2021    2:10 PM  Fall Risk   Falls in the past year? 0 0 0 0 0  Number falls in past yr: 0 0 0 0 0  Injury with Fall? 0 0 0 0 0  Risk for fall due to :  No Fall Risks No Fall Risks    Follow up  Falls evaluation completed Falls evaluation completed      FALL RISK PREVENTION PERTAINING TO THE HOME:  Any stairs in or around the home? Yes  If so, are there any without handrails? Yes  Home free of loose throw rugs in walkways, pet beds, electrical cords, etc? Yes  Adequate lighting in your home to reduce risk of falls? Yes   ASSISTIVE DEVICES UTILIZED TO PREVENT FALLS:  Life alert? No  Use of a cane, walker or w/c? No  Grab bars in the bathroom? Yes  Shower chair or bench in shower? Yes  Elevated toilet seat or a handicapped toilet? Yes    Cognitive Function:        05/02/2021    4:28 PM 05/01/2020    9:30 AM 05/01/2020    9:29 AM 03/19/2019    9:46 AM 03/16/2018    2:28 PM  6CIT Screen  What Year? 0 points 0 points 0 points 0 points 0 points  What month? 0 points 0 points 0 points 0 points 0 points  What time? 0 points 0 points 0 points 0 points 0 points  Count back from 20 0 points 0 points  0 points 0 points  Months in reverse 2 points 0 points  2 points 0 points  Repeat phrase 2 points 0 points  2 points 2 points  Total Score 4 points 0 points  4 points 2 points    Immunizations Immunization History  Administered Date(s) Administered   Fluad Quad(high Dose 65+) 04/21/2019, 04/13/2020, 04/25/2022   H1N1 05/23/2008   Influenza Split 04/02/2012, 04/21/2014, 03/05/2016   Influenza Whole 04/08/2007, 05/01/2009, 04/03/2010   Influenza,inj,Quad PF,6+ Mos 03/30/2015, 03/12/2017, 03/16/2018   Influenza-Unspecified 05/10/2021   Moderna Covid-19  Vaccine Bivalent Booster 28yrs & up 10/31/2021   Moderna Sars-Covid-2 Vaccination 10/25/2019, 11/22/2019, 06/05/2020, 01/12/2021   PNEUMOCOCCAL CONJUGATE-20 10/31/2021   Pneumococcal Conjugate-13 08/04/2014   Pneumococcal Polysaccharide-23 01/16/2004, 06/12/2010, 09/14/2015   Td 01/16/2004   Zoster Recombinat (Shingrix) 05/05/2017, 04/20/2018   Zoster, Live 07/20/2008    TDAP status: Up to date  Flu Vaccine status: Up to date  Pneumococcal vaccine status: Up to date  Covid-19 vaccine status: Information provided on how to obtain vaccines.   Qualifies for Shingles Vaccine? Yes   Zostavax completed No   Shingrix Completed?: Yes  Screening Tests Health Maintenance  Topic Date Due   COVID-19 Vaccine (6 - Moderna series) 03/02/2022   MAMMOGRAM  06/26/2023   COLONOSCOPY (Pts 45-20yrs Insurance coverage will need to be confirmed)  11/17/2023   TETANUS/TDAP  03/13/2032   Pneumonia Vaccine 83+ Years old  Completed   INFLUENZA VACCINE  Completed   DEXA SCAN  Completed   Hepatitis C Screening  Completed   Zoster Vaccines- Shingrix  Completed   HPV VACCINES  Aged Out    Health Maintenance  Health Maintenance Due  Topic Date Due   COVID-19 Vaccine (6 - Moderna series) 03/02/2022    Colorectal cancer screening: Type of screening: Colonoscopy. Completed 4/2//2015. Repeat every 10 years  Mammogram status: Completed 06/25/2021. Repeat every  2 years  Bone Density status: Completed 04/16/2018. Results reflect: Bone density results: OSTEOPENIA.   Lung Cancer Screening: (Low Dose CT Chest recommended if Age 42-80 years, 30 pack-year currently smoking OR have quit w/in 15years.) does not qualify.    Additional Screening:  Hepatitis C Screening: does not qualify; Completed 05/25/2015  Vision Screening: Recommended annual ophthalmology exams for early detection of glaucoma and other disorders of the eye. Is the patient up to date with their annual eye exam?  Yes  Who is the provider  or what is the name of the office in which the patient attends annual eye exams? Dr.Cotter If pt is not established with a provider, would they like to be referred to a provider to establish care? No .   Dental Screening: Recommended annual dental exams for proper oral hygiene  Community Resource Referral / Chronic Care Management: CRR required this visit?  Yes   CCM required this visit?  Yes      Plan:     I have personally reviewed and noted the following in the patient's chart:   Medical and social history Use of alcohol, tobacco or illicit drugs  Current medications and supplements including opioid prescriptions. Patient is not currently taking opioid prescriptions. Functional ability and status Nutritional status Physical activity Advanced directives List of other physicians Hospitalizations, surgeries, and ER visits in previous 12 months Vitals Screenings to include cognitive, depression, and falls Referrals and appointments  In addition, I have reviewed and discussed with patient certain preventive protocols, quality metrics, and best practice recommendations.  A written personalized care plan for preventive services as well as general preventive health recommendations were provided to patient.     Mallory Banister, MD   05/06/2022

## 2022-05-06 NOTE — Patient Instructions (Signed)
  Ms. Haning , Thank you for taking time to come for your Medicare Wellness Visit. I appreciate your ongoing commitment to your health goals. Please review the following plan we discussed and let me know if I can assist you in the future.   These are the goals we discussed: Obtain your Covid Booster.    Goals      Increase water intake     Recommend increasing your water intake to 64 ounces a day.      Weight (lb) < 200 lb (90.7 kg)     Wants to lose 20lbs. Doing Water Aerobics 3x per week.        This is a list of the screening recommended for you and due dates:  Health Maintenance  Topic Date Due   COVID-19 Vaccine (6 - Moderna series) 03/02/2022   Mammogram  06/26/2023   Colon Cancer Screening  11/17/2023   Tetanus Vaccine  03/13/2032   Pneumonia Vaccine  Completed   Flu Shot  Completed   DEXA scan (bone density measurement)  Completed   Hepatitis C Screening: USPSTF Recommendation to screen - Ages 31-79 yo.  Completed   Zoster (Shingles) Vaccine  Completed   HPV Vaccine  Aged Out

## 2022-05-30 ENCOUNTER — Other Ambulatory Visit: Payer: Self-pay | Admitting: Family Medicine

## 2022-06-04 ENCOUNTER — Other Ambulatory Visit: Payer: Self-pay | Admitting: Family Medicine

## 2022-06-04 DIAGNOSIS — J984 Other disorders of lung: Secondary | ICD-10-CM | POA: Diagnosis not present

## 2022-06-18 DIAGNOSIS — I10 Essential (primary) hypertension: Secondary | ICD-10-CM | POA: Diagnosis not present

## 2022-06-18 DIAGNOSIS — R7303 Prediabetes: Secondary | ICD-10-CM | POA: Diagnosis not present

## 2022-06-18 DIAGNOSIS — E785 Hyperlipidemia, unspecified: Secondary | ICD-10-CM | POA: Diagnosis not present

## 2022-06-18 DIAGNOSIS — E559 Vitamin D deficiency, unspecified: Secondary | ICD-10-CM | POA: Diagnosis not present

## 2022-06-20 LAB — CMP14+EGFR
ALT: 16 IU/L (ref 0–32)
AST: 22 IU/L (ref 0–40)
Albumin/Globulin Ratio: 1.4 (ref 1.2–2.2)
Albumin: 4.2 g/dL (ref 3.8–4.8)
Alkaline Phosphatase: 84 IU/L (ref 44–121)
BUN/Creatinine Ratio: 19 (ref 12–28)
BUN: 20 mg/dL (ref 8–27)
Bilirubin Total: 0.7 mg/dL (ref 0.0–1.2)
CO2: 22 mmol/L (ref 20–29)
Calcium: 9.7 mg/dL (ref 8.7–10.3)
Chloride: 102 mmol/L (ref 96–106)
Creatinine, Ser: 1.04 mg/dL — ABNORMAL HIGH (ref 0.57–1.00)
Globulin, Total: 2.9 g/dL (ref 1.5–4.5)
Glucose: 114 mg/dL — ABNORMAL HIGH (ref 70–99)
Potassium: 4.4 mmol/L (ref 3.5–5.2)
Sodium: 141 mmol/L (ref 134–144)
Total Protein: 7.1 g/dL (ref 6.0–8.5)
eGFR: 56 mL/min/{1.73_m2} — ABNORMAL LOW (ref >59)

## 2022-06-20 LAB — HEMOGLOBIN A1C
Est. average glucose Bld gHb Est-mCnc: 148 mg/dL
Hgb A1c MFr Bld: 6.8 % — ABNORMAL HIGH (ref 4.8–5.6)

## 2022-06-20 LAB — CBC
Hematocrit: 38.9 % (ref 34.0–46.6)
Hemoglobin: 12.9 g/dL (ref 11.1–15.9)
MCH: 29.8 pg (ref 26.6–33.0)
MCHC: 33.2 g/dL (ref 31.5–35.7)
MCV: 90 fL (ref 79–97)
Platelets: 175 10*3/uL (ref 150–450)
RBC: 4.33 x10E6/uL (ref 3.77–5.28)
RDW: 13.1 % (ref 11.7–15.4)
WBC: 9.6 10*3/uL (ref 3.4–10.8)

## 2022-06-20 LAB — LIPID PANEL
Chol/HDL Ratio: 2.6 ratio (ref 0.0–4.4)
Cholesterol, Total: 190 mg/dL (ref 100–199)
HDL: 72 mg/dL (ref >39)
LDL Chol Calc (NIH): 104 mg/dL — ABNORMAL HIGH (ref 0–99)
Triglycerides: 77 mg/dL (ref 0–149)
VLDL Cholesterol Cal: 14 mg/dL (ref 5–40)

## 2022-06-20 LAB — TSH: TSH: 1.92 u[IU]/mL (ref 0.450–4.500)

## 2022-06-25 ENCOUNTER — Encounter: Payer: Self-pay | Admitting: Family Medicine

## 2022-06-25 ENCOUNTER — Ambulatory Visit (INDEPENDENT_AMBULATORY_CARE_PROVIDER_SITE_OTHER): Payer: Medicare Other | Admitting: Family Medicine

## 2022-06-25 VITALS — BP 125/71 | HR 69 | Ht 60.0 in | Wt 192.0 lb

## 2022-06-25 DIAGNOSIS — E785 Hyperlipidemia, unspecified: Secondary | ICD-10-CM | POA: Diagnosis not present

## 2022-06-25 DIAGNOSIS — H60331 Swimmer's ear, right ear: Secondary | ICD-10-CM | POA: Diagnosis not present

## 2022-06-25 DIAGNOSIS — H6091 Unspecified otitis externa, right ear: Secondary | ICD-10-CM | POA: Insufficient documentation

## 2022-06-25 DIAGNOSIS — R7303 Prediabetes: Secondary | ICD-10-CM | POA: Diagnosis not present

## 2022-06-25 DIAGNOSIS — Z0001 Encounter for general adult medical examination with abnormal findings: Secondary | ICD-10-CM

## 2022-06-25 DIAGNOSIS — T733XXA Exhaustion due to excessive exertion, initial encounter: Secondary | ICD-10-CM

## 2022-06-25 DIAGNOSIS — I10 Essential (primary) hypertension: Secondary | ICD-10-CM

## 2022-06-25 MED ORDER — CIPROFLOXACIN-DEXAMETHASONE 0.3-0.1 % OT SUSP: OTIC | 0 refills | Status: DC

## 2022-06-25 NOTE — Assessment & Plan Note (Signed)
Ciprodex prescribed 

## 2022-06-25 NOTE — Assessment & Plan Note (Signed)
New onset exertional fatigue with ADL's needs to rest when making up her bed, Cardiology eval

## 2022-06-25 NOTE — Assessment & Plan Note (Signed)
Hyperlipidemia:Low fat diet discussed and encouraged.   Lipid Panel  Lab Results  Component Value Date   CHOL 190 06/18/2022   HDL 72 06/18/2022   LDLCALC 104 (H) 06/18/2022   TRIG 77 06/18/2022   CHOLHDL 2.6 06/18/2022     Need sto reduce fat , no med change

## 2022-06-25 NOTE — Patient Instructions (Addendum)
F/u mid March, call if you need me sooner  Need to reduce fried and fatty foods, white foods like potato, rice , past , bread and sweets, blood sugar and cholesterol are both too high  Fasting lipid, cmp and EGFr and HBA1C 3 to 5 days before next visit  You are being referred to Cardiology to evaluate new fatigue and poor exercise tolerance  Call for referral for shoulders when you decide you need help  Thanks for choosing [facility], we consider it a privelige to serve you.

## 2022-06-25 NOTE — Assessment & Plan Note (Signed)

## 2022-06-25 NOTE — Progress Notes (Signed)
    [name]     MRN: [id number]      DOB: [birth year]  HPI: Patient is in for annual physical exam. C/o bilateral shoulder pain, right greater than left and also low back pain, no interest in Ortho eval currently C/ompoor execise tolerance in last several weeksand increased fatigue Recent labs,  are reviewed. Immunization is reviewed , and  updated if needed.   PE: BP 125/71 (BP Location: Left Arm, Patient Position: Sitting, Cuff Size: Large)   Pulse 69   Ht 5' (1.524 m)   Wt 192 lb 0.6 oz (87.1 kg)   SpO2 94%   BMI 37.51 kg/m   Pleasant  female, alert and oriented x 3, in no cardio-pulmonary distress. Afebrile. HEENT No facial trauma or asymetry. Sinuses non tender.  Extra occullar muscles intact.. External ears normal, .right external canal erythematous Neck: decreased ROM, no adenopathy,JVD or thyromegaly.No bruits.  Chest: Clear to ascultation bilaterally.No crackles or wheezes. Non tender to palpation  Cardiovascular system; Heart sounds normal,  S1 and  S2 ,no S3.  No murmur, or thrill. Apical beat not displaced Peripheral pulses normal.  Abdomen: Soft, non tender, no organomegaly or masses. No bruits. Bowel sounds normal. No guarding, tenderness or rebound.      Musculoskeletal exam: Decreased ROM of spine, hips , shoulders and knees.  deformity ,swelling or crepitus noted. No muscle wasting or atrophy.   Neurologic: Cranial nerves 2 to 12 intact. Power, tone ,sensation  normal throughout. disturbance in gait. No tremor.  Skin: Intact, no ulceration, erythema , scaling or rash noted. Pigmentation normal throughout  Psych; Normal mood and affect. Judgement and concentration normal   Assessment & Plan:  Annual visit for general adult medical examination with abnormal findings Annual exam as documented. Counseling done  re healthy lifestyle involving commitment to 150 minutes exercise per week, heart healthy diet, and attaining healthy  weight.The importance of adequate sleep also discussed. Regular seat belt use and home safety, is also discussed. Changes in health habits are decided on by the patient with goals and time frames  set for achieving them. Immunization and cancer screening needs are specifically addressed at this visit.   Fatigue due to excessive exertion New onset exertional fatigue with ADL's needs to rest when making up her bed, Cardiology eval  Hyperlipidemia LDL goal <100 Hyperlipidemia:Low fat diet discussed and encouraged.   Lipid Panel  Lab Results  Component Value Date   CHOL 190 06/18/2022   HDL 72 06/18/2022   LDLCALC 104 (H) 06/18/2022   TRIG 77 06/18/2022   CHOLHDL 2.6 06/18/2022     Need sto reduce fat , no med change  Right otitis externa Ciprodex prescribed

## 2022-06-27 ENCOUNTER — Ambulatory Visit (HOSPITAL_COMMUNITY)
Admission: RE | Admit: 2022-06-27 | Discharge: 2022-06-27 | Disposition: A | Discharge status: Home / Self Care | Payer: Medicare Other | Source: Ambulatory Visit | Attending: Family Medicine | Admitting: Family Medicine

## 2022-06-27 DIAGNOSIS — Z1231 Encounter for screening mammogram for malignant neoplasm of breast: Secondary | ICD-10-CM | POA: Insufficient documentation

## 2022-07-04 DIAGNOSIS — J984 Other disorders of lung: Secondary | ICD-10-CM | POA: Diagnosis not present

## 2022-07-11 ENCOUNTER — Other Ambulatory Visit: Payer: Self-pay | Admitting: Family Medicine

## 2022-07-22 HISTORY — PX: MULTIPLE TOOTH EXTRACTIONS: SHX2053

## 2022-08-04 DIAGNOSIS — J984 Other disorders of lung: Secondary | ICD-10-CM | POA: Diagnosis not present

## 2022-08-12 ENCOUNTER — Other Ambulatory Visit: Payer: Self-pay | Admitting: Family Medicine

## 2022-08-16 ENCOUNTER — Encounter: Payer: Self-pay | Admitting: Cardiology

## 2022-08-16 NOTE — Progress Notes (Unsigned)
Cardiology Office Note  Date: 08/19/2022   ID: Jinnifer, Montejano Oct 22, 1945, MRN [id number]  PCP:  [name], MD  Cardiologist:  Rozann Lesches, MD Electrophysiologist:  None   Chief Complaint  Patient presents with   Shortness of Breath    History of Present Illness: [name] is a 77 y.o. female referred for cardiology consultation by Dr. Moshe Cipro for the evaluation of exertional fatigue.  We discussed her symptoms today.  She does not report any change in stamina, no worsening dyspnea on exertion which has been more of a chronic problem.  No exertional chest tightness.  She goes to water aerobics 3 days a week, enjoys this without limitation.  If she walks on level ground from the parking lot into a building she notices shortness of breath in the range of NYHA class II-III.  She does use oxygen at nighttime.  She follows with Dr. Melvyn Novas, was last seen in August 2023, I reviewed the note.  She has a history of hypoxic respiratory failure to some degree (uses oxygen at nighttime) as well as remotely documented mildly elevated estimated RVSP, not able to be calculated by most recent follow-up echocardiogram showing low normal RV contraction.  Most recent PFTs in October 2022 most consistent with low lung volumes affected by weight.  She has not undergone any recent ischemic testing.  I personally reviewed her ECG today which shows sinus rhythm, rule out old inferior infarct pattern, decreased R wave progression with low voltage.  Blood pressure was up today, she had not yet taken her morning medications.  Past Medical History:  Diagnosis Date   Asthma    Back pain    Depression    Diverticulosis of colon    GERD (gastroesophageal reflux disease)    Hyperlipidemia    Hypertension    Obesity    Osteoarthritis of knees, bilateral     Past Surgical History:  Procedure Laterality Date   ABDOMINAL HYSTERECTOMY     COLONOSCOPY  2005   Dr. Leane Para Smith:numerous  large scattered diverticula   COLONOSCOPY N/A 11/15/2013   Dr. Oneida Alar: moderate diverticula, small internal hemorrhoids, redudant colon. Next TCS in 2025 with overtube.    ESOPHAGOGASTRODUODENOSCOPY (EGD) WITH ESOPHAGEAL DILATION N/A 11/15/2013   Dr. Oneida Alar: stricture at [place] junction s/p dilation. moderate erosive gastritis, negative H.pylori   TOTAL KNEE ARTHROPLASTY  06/01/04   left / Dr. Aline Brochure   TOTAL KNEE ARTHROPLASTY  11/29/03   right / Dr. Aline Brochure   TUBAL LIGATION      Current Outpatient Medications  Medication Sig Dispense Refill   amLODipine (NORVASC) 10 MG tablet Take 1 tablet (10 mg total) by mouth daily. 90 tablet 3   aspirin EC 81 MG tablet Take 81 mg by mouth daily. Swallow whole.     atorvastatin (LIPITOR) 20 MG tablet Take 1 tablet (20 mg total) by mouth daily. 90 tablet 3   BIOTIN PO Take by mouth.     Cholecalciferol (VITAMIN D3) 125 MCG (5000 UT) CAPS Take 1 capsule (5,000 Units total) by mouth daily. 30 capsule 11   ciprofloxacin-dexamethasone (CIPRODEX) OTIC suspension INSTILL FOUR DROPS TO RIGHT EAR TWICE DAILY FOR 1 WEEK, THEN AS NEEDED 7.5 mL 0   cloNIDine (CATAPRES) 0.2 MG tablet TAKE ONE TABLET BY MOUTH AT 10 30 PM EACH NIGHT 30 tablet 2   clotrimazole-betamethasone (LOTRISONE) cream Apply cream twice daily to rash in groin for 10 days, then as needed 45 g 1   famotidine (  PEPCID) 10 MG tablet Take 10 mg by mouth 2 (two) times daily.     gabapentin (NEURONTIN) 300 MG capsule Take 1 capsule (300 mg total) by mouth at bedtime. 90 capsule 3   Hydrocortisone (GERHARDT'S BUTT CREAM) CREA Apply 1 application topically 3 (three) times daily. 1 each 1   losartan-hydrochlorothiazide (HYZAAR) 100-12.5 MG tablet Take 1 tablet by mouth daily. 90 tablet 3   meloxicam (MOBIC) 15 MG tablet Take 15 mg by mouth daily.     metoprolol succinate (TOPROL-XL) 50 MG 24 hr tablet TAKE 1 TABLET BY MOUTH  DAILY WITH OR IMMEDIATELY  FOLLOWING A MEAL 100 tablet 2   olopatadine (PATANOL) 0.1 %  ophthalmic solution INSTILL 1 DROP INTO BOTH EYES TWICE A DAY 5 mL 6   omeprazole (PRILOSEC) 20 MG capsule Take 1 capsule (20 mg total) by mouth daily. 100 capsule 2   potassium chloride SA (KLOR-CON M) 20 MEQ tablet TAKE 1 TABLET BY MOUTH  DAILY 100 tablet 2   pyridOXINE (VITAMIN B-6) 100 MG tablet Take 200 mg by mouth daily.     sertraline (ZOLOFT) 25 MG tablet TAKE 1 TABLET BY MOUTH DAILY 90 tablet 3   No current facility-administered medications for this visit.   Allergies:  Patient has no known allergies.   Social History: The patient  reports that she has never smoked. She has never used smokeless tobacco. She reports that she does not drink alcohol and does not use drugs.   Family History: The patient's family history includes Aneurysm in her father; Breast cancer in her maternal aunt; Colon cancer in her maternal uncle; Diabetes in her brother, brother, father, and mother; Heart disease in her mother; Hypertension in her brother, brother, mother, and son.   ROS: No palpitations or syncope.  Physical Exam: VS:  BP (!) 150/85 Comment: Has not had medications this morning.  Pulse 74   Ht 5' (1.524 m)   Wt 188 lb 12.8 oz (85.6 kg)   SpO2 98%   BMI 36.87 kg/m , BMI Body mass index is 36.87 kg/m.  Wt Readings from Last 3 Encounters:  08/19/22 188 lb 12.8 oz (85.6 kg)  06/25/22 192 lb 0.6 oz (87.1 kg)  04/25/22 188 lb (85.3 kg)    General: Patient appears comfortable at rest. HEENT: Conjunctiva and lids normal. Neck: Supple, no elevated JVP or carotid bruits. Lungs: Decreased breath sounds without wheezing or crackles, nonlabored breathing at rest. Cardiac: Regular rate and rhythm, no S3 or significant systolic murmur, no pericardial rub. Abdomen: Soft, nontender, bowel sounds present. Extremities: No pitting edema, distal pulses 2+. Skin: Warm and dry. Musculoskeletal: No kyphosis. Neuropsychiatric: Alert and oriented x3, affect grossly appropriate.  ECG:  An ECG dated  01/26/2013 was personally reviewed today and demonstrated:  Sinus rhythm with PVC, possible left atrial enlargement.  Recent Labwork: 06/18/2022: ALT 16; AST 22; BUN 20; Creatinine, Ser 1.04; Hemoglobin 12.9; Platelets 175; Potassium 4.4; Sodium 141; TSH 1.920     Component Value Date/Time   CHOL 190 06/18/2022 1157   TRIG 77 06/18/2022 1157   HDL 72 06/18/2022 1157   CHOLHDL 2.6 06/18/2022 1157   CHOLHDL 2.7 02/15/2020 0848   VLDL 13 11/25/2016 1038   LDLCALC 104 (H) 06/18/2022 1157   LDLCALC 100 (H) 02/15/2020 0848    Other Studies Reviewed Today:  Echocardiogram 07/17/2021:  1. Technically difficult study with limited echo windows   2. Left ventricular ejection fraction, by estimation, is 65 to 70%. The  left ventricle  has normal function. The left ventricle has no regional  wall motion abnormalities. There is mild left ventricular hypertrophy.  Left ventricular diastolic parameters  are consistent with Grade I diastolic dysfunction (impaired relaxation).   3. Right ventricular systolic function is low normal. The right  ventricular size is normal. Tricuspid regurgitation signal is inadequate  for assessing PA pressure.   4. Left atrial size was mildly dilated.   5. The mitral valve is abnormal. Trivial mitral valve regurgitation.   6. The aortic valve is tricuspid. Aortic valve regurgitation is not  visualized.   Assessment and Plan:  1.  Dyspnea on exertion, fairly longstanding and largely NYHA class II, sometimes worse depending on level of activity.  No obvious angina, no palpitations or syncope.  She follows with Dr. Sherene Sires, I reviewed the recent notes and pulmonary workup.  Last cardiac structural assessment was in 2022 and she has not undergone any ischemic testing.  We will plan on a follow-up echocardiogram and also proceed with Lexiscan Myoview.  ECG abnormal, rule out old inferior infarct pattern.  2.  Mixed hyperlipidemia, on Lipitor with follow-up by Dr. Lodema Hong.   Last LDL 104.  She reports compliance with therapy.  3.  Essential hypertension on Norvasc, Catapres, Toprol-XL, and Hyzaar.  Medication Adjustments/Labs and Tests Ordered: Current medicines are reviewed at length with the patient today.  Concerns regarding medicines are outlined above.   Tests Ordered: Orders Placed This Encounter  Procedures   NM Myocar Multi W/Spect W/Wall Motion / EF   EKG 12-Lead   ECHOCARDIOGRAM COMPLETE    Medication Changes: No orders of the defined types were placed in this encounter.   Disposition:  Follow up  test results.  Signed, Jonelle Sidle, MD, [facility] 08/19/2022 11:04 AM    [facility] [address], [place], [place] [address] Phone: [phone]; Fax: [phone]

## 2022-08-19 ENCOUNTER — Encounter: Payer: Self-pay | Admitting: Cardiology

## 2022-08-19 ENCOUNTER — Ambulatory Visit: Payer: Medicare Other | Attending: Cardiology | Admitting: Cardiology

## 2022-08-19 VITALS — BP 150/85 | HR 74 | Ht 60.0 in | Wt 188.8 lb

## 2022-08-19 DIAGNOSIS — I10 Essential (primary) hypertension: Secondary | ICD-10-CM

## 2022-08-19 DIAGNOSIS — R0609 Other forms of dyspnea: Secondary | ICD-10-CM | POA: Diagnosis not present

## 2022-08-19 DIAGNOSIS — E782 Mixed hyperlipidemia: Secondary | ICD-10-CM

## 2022-08-19 DIAGNOSIS — R9431 Abnormal electrocardiogram [ECG] [EKG]: Secondary | ICD-10-CM | POA: Diagnosis not present

## 2022-08-19 NOTE — Patient Instructions (Signed)
Medication Instructions:  Your physician recommends that you continue on your current medications as directed. Please refer to the Current Medication list given to you today.   Labwork: None today  Testing/Procedures: Your physician has requested that you have an echocardiogram. Echocardiography is a painless test that uses sound waves to create images of your heart. It provides your doctor with information about the size and shape of your heart and how well your heart's chambers and valves are working. This procedure takes approximately one hour. There are no restrictions for this procedure. Please do NOT wear cologne, perfume, aftershave, or lotions (deodorant is allowed). Please arrive 15 minutes prior to your appointment time.   Your physician has requested that you have a lexiscan myoview. For further information please visit [email or web address]. Please follow instruction sheet, as given.   Follow-Up: We will call you with results   Any Other Special Instructions Will Be Listed Below (If Applicable).  If you need a refill on your cardiac medications before your next appointment, please call your pharmacy.

## 2022-08-26 ENCOUNTER — Encounter (HOSPITAL_COMMUNITY)
Admission: RE | Admit: 2022-08-26 | Discharge: 2022-08-26 | Disposition: A | Discharge status: Home / Self Care | Payer: Medicare Other | Source: Ambulatory Visit | Attending: Cardiology | Admitting: Cardiology

## 2022-08-26 ENCOUNTER — Ambulatory Visit (HOSPITAL_COMMUNITY)
Admission: RE | Admit: 2022-08-26 | Discharge: 2022-08-26 | Disposition: A | Discharge status: Home / Self Care | Payer: Medicare Other | Source: Ambulatory Visit | Attending: Cardiology | Admitting: Cardiology

## 2022-08-26 DIAGNOSIS — R0609 Other forms of dyspnea: Secondary | ICD-10-CM | POA: Diagnosis not present

## 2022-08-26 LAB — NM MYOCAR MULTI W/SPECT W/WALL MOTION / EF
LV dias vol: 56 mL (ref 46–106)
LV sys vol: 13 mL
Nuc Stress EF: 76 %
Peak HR: 96 {beats}/min
RATE: 0.4
Rest HR: 64 {beats}/min
Rest Nuclear Isotope Dose: 11 mCi
SDS: 1
SRS: 1
SSS: 2
ST Depression (mm): 0 mm
Stress Nuclear Isotope Dose: 29 mCi
TID: 0.94

## 2022-08-26 MED ORDER — SODIUM CHLORIDE FLUSH 0.9 % IV SOLN
INTRAVENOUS | Status: AC
  Administered 2022-08-26: 10 mL via INTRAVENOUS
  Filled 2022-08-26: qty 10

## 2022-08-26 MED ORDER — TECHNETIUM TC 99M TETROFOSMIN IV KIT
30.0000 | PACK | Freq: Once | INTRAVENOUS | Status: AC | PRN
  Administered 2022-08-26: 29 via INTRAVENOUS

## 2022-08-26 MED ORDER — TECHNETIUM TC 99M TETROFOSMIN IV KIT
10.0000 | PACK | Freq: Once | INTRAVENOUS | Status: AC | PRN
  Administered 2022-08-26: 11 via INTRAVENOUS

## 2022-08-26 MED ORDER — REGADENOSON 0.4 MG/5ML IV SOLN
INTRAVENOUS | Status: AC
  Administered 2022-08-26: 0.4 mg via INTRAVENOUS
  Filled 2022-08-26: qty 5

## 2022-08-28 ENCOUNTER — Telehealth: Payer: Self-pay | Admitting: Cardiology

## 2022-08-28 NOTE — Telephone Encounter (Signed)
Patient returned RN's call. 

## 2022-08-28 NOTE — Telephone Encounter (Signed)
Patient notified and verbalized understanding. Patient had no questions or concerns at this time.  

## 2022-08-28 NOTE — Telephone Encounter (Signed)
Mallory Sark, MD 08/26/2022  4:29 PM EST     Results reviewed.  Please let her know that the follow-up stress test looks good, no evidence of ischemia to suggest obstructive CAD as cause of her symptoms.  We will follow-up on echocardiogram that is pending.

## 2022-09-04 DIAGNOSIS — J984 Other disorders of lung: Secondary | ICD-10-CM | POA: Diagnosis not present

## 2022-09-05 ENCOUNTER — Ambulatory Visit (HOSPITAL_COMMUNITY)
Admission: RE | Admit: 2022-09-05 | Discharge: 2022-09-05 | Disposition: A | Discharge status: Home / Self Care | Payer: Medicare Other | Source: Ambulatory Visit | Attending: Cardiology | Admitting: Cardiology

## 2022-09-05 DIAGNOSIS — R0609 Other forms of dyspnea: Secondary | ICD-10-CM | POA: Insufficient documentation

## 2022-09-05 LAB — ECHOCARDIOGRAM COMPLETE
Area-P 1/2: 2.8 cm2
S' Lateral: 2.3 cm

## 2022-09-05 NOTE — Progress Notes (Signed)
  Echocardiogram 2D Echocardiogram has been performed.  [name] 09/05/2022, 11:14 AM

## 2022-09-19 ENCOUNTER — Encounter: Payer: Self-pay | Admitting: Radiology

## 2022-10-01 ENCOUNTER — Encounter: Payer: Self-pay | Admitting: Family Medicine

## 2022-10-01 ENCOUNTER — Ambulatory Visit (INDEPENDENT_AMBULATORY_CARE_PROVIDER_SITE_OTHER): Payer: Medicare Other | Admitting: Family Medicine

## 2022-10-01 VITALS — BP 150/82 | HR 82 | Ht 60.0 in | Wt 187.0 lb

## 2022-10-01 DIAGNOSIS — I10 Essential (primary) hypertension: Secondary | ICD-10-CM

## 2022-10-01 DIAGNOSIS — F32A Depression, unspecified: Secondary | ICD-10-CM | POA: Diagnosis not present

## 2022-10-01 DIAGNOSIS — M5440 Lumbago with sciatica, unspecified side: Secondary | ICD-10-CM

## 2022-10-01 DIAGNOSIS — K219 Gastro-esophageal reflux disease without esophagitis: Secondary | ICD-10-CM | POA: Diagnosis not present

## 2022-10-01 DIAGNOSIS — E785 Hyperlipidemia, unspecified: Secondary | ICD-10-CM | POA: Diagnosis not present

## 2022-10-01 DIAGNOSIS — G8929 Other chronic pain: Secondary | ICD-10-CM | POA: Diagnosis not present

## 2022-10-01 DIAGNOSIS — F419 Anxiety disorder, unspecified: Secondary | ICD-10-CM | POA: Diagnosis not present

## 2022-10-01 MED ORDER — LOSARTAN POTASSIUM-HCTZ 100-25 MG PO TABS: 1.0000 | ORAL_TABLET | Freq: Every day | ORAL | 5 refills | Status: DC

## 2022-10-01 NOTE — Patient Instructions (Addendum)
F/u mid May, call if you need me sooner  Increase dose of losartan/hctz, new dose 100/25 one daily, collect today to start tomorrow  Pls get fasting labs ordered in December for your May appointment 3 to 5 days before next visit  Thanks for choosing [facility], we consider it a privelige to serve you.

## 2022-10-03 DIAGNOSIS — J984 Other disorders of lung: Secondary | ICD-10-CM | POA: Diagnosis not present

## 2022-10-06 ENCOUNTER — Encounter: Payer: Self-pay | Admitting: Family Medicine

## 2022-10-06 NOTE — Assessment & Plan Note (Signed)
Hyperlipidemia:Low fat diet discussed and encouraged.   Lipid Panel  Lab Results  Component Value Date   CHOL 190 06/18/2022   HDL 72 06/18/2022   LDLCALC 104 (H) 06/18/2022   TRIG 77 06/18/2022   CHOLHDL 2.6 06/18/2022     Needs to reduce fat in diet Updated lab needed at/ before next visit.

## 2022-10-06 NOTE — Progress Notes (Signed)
[name]     MRN: [id number]      DOB: 11/30/1945   HPI [name] is here for follow up and re-evaluation of chronic medical conditions, medication management and review of any available recent lab and radiology data.  Preventive health is updated, specifically  Cancer screening and Immunization.   Questions or concerns regarding consultations or procedures which the PT has had in the interim are  addressed. The PT denies any adverse reactions to current medications since the last visit.  There are no new concerns.  There are no specific complaints   ROS Denies recent fever or chills. Denies sinus pressure, nasal congestion, ear pain or sore throat. Denies chest congestion, productive cough or wheezing. Denies chest pains, palpitations and leg swelling Denies abdominal pain, nausea, vomiting,diarrhea or constipation.   Denies dysuria, frequency, hesitancy or incontinence. Denies joint pain, swelling and limitation in mobility. Denies headaches, seizures, numbness, or tingling. Denies depression, anxiety or insomnia. Denies skin break down or rash.   PE  BP (!) 150/82   Pulse 82   Ht 5' (1.524 m)   Wt 187 lb 0.6 oz (84.8 kg)   SpO2 95%   BMI 36.53 kg/m   Patient alert and oriented and in no cardiopulmonary distress.  HEENT: No facial asymmetry, EOMI,     Neck supple .  Chest: Clear to auscultation bilaterally.  CVS: S1, S2 no murmurs, no S3.Regular rate.  ABD: Soft non tender.   Ext: No edema  MS: Adequate ROM spine, shoulders, hips and knees.  Skin: Intact, no ulcerations or rash noted.  Psych: Good eye contact, normal affect. Memory intact not anxious or depressed appearing.  CNS: CN 2-12 intact, power,  normal throughout.no focal deficits noted.   Assessment & Plan  Essential hypertension Uncontrolled , dose increase in medication with closr f/u DASH diet and commitment to daily physical activity for a minimum of 30 minutes discussed and  encouraged, as a part of hypertension management. The importance of attaining a healthy weight is also discussed.     10/01/2022    3:50 PM 10/01/2022    3:11 PM 08/19/2022   10:28 AM 06/25/2022    3:10 PM 04/25/2022    4:11 PM 04/25/2022    3:40 PM 04/25/2022    3:39 PM  BP/Weight  Systolic BP Q000111Q [id number] Q000111Q 0000000 0000000 [id number]  Diastolic BP 82 87 85 71 70 72 70  Wt. (Lbs)  187.04 188.8 192.04   188  BMI  36.53 kg/m2 36.87 kg/m2 37.51 kg/m2   36.72 kg/m2       Morbid obesity due to excess calories ([place]) complicated by hbp/hyperlipidemia  Patient re-educated about  the importance of commitment to a  minimum of 150 minutes of exercise per week as able.  The importance of healthy food choices with portion control discussed, as well as eating regularly and within a 12 hour window most days. The need to choose "clean , green" food 50 to 75% of the time is discussed, as well as to make water the primary drink and set a goal of 64 ounces water daily.       10/01/2022    3:11 PM 08/19/2022   10:28 AM 06/25/2022    3:10 PM  Weight /BMI  Weight 187 lb 0.6 oz 188 lb 12.8 oz 192 lb 0.6 oz  Height 5' (1.524 m) 5' (1.524 m) 5' (1.524 m)  BMI 36.53 kg/m2 36.87 kg/m2 37.51 kg/m2  Anxiety and depression Controlled, no change in medication   Hyperlipidemia LDL goal <100 Hyperlipidemia:Low fat diet discussed and encouraged.   Lipid Panel  Lab Results  Component Value Date   CHOL 190 06/18/2022   HDL 72 06/18/2022   LDLCALC 104 (H) 06/18/2022   TRIG 77 06/18/2022   CHOLHDL 2.6 06/18/2022     Needs to reduce fat in diet Updated lab needed at/ before next visit.   GERD Controlled, no change in medication   Back pain Controlled on gabapentin, regular exercise and weight loss encouraged

## 2022-10-06 NOTE — Assessment & Plan Note (Signed)
  Patient re-educated about  the importance of commitment to a  minimum of 150 minutes of exercise per week as able.  The importance of healthy food choices with portion control discussed, as well as eating regularly and within a 12 hour window most days. The need to choose "clean , green" food 50 to 75% of the time is discussed, as well as to make water the primary drink and set a goal of 64 ounces water daily.       10/01/2022    3:11 PM 08/19/2022   10:28 AM 06/25/2022    3:10 PM  Weight /BMI  Weight 187 lb 0.6 oz 188 lb 12.8 oz 192 lb 0.6 oz  Height 5' (1.524 m) 5' (1.524 m) 5' (1.524 m)  BMI 36.53 kg/m2 36.87 kg/m2 37.51 kg/m2

## 2022-10-06 NOTE — Assessment & Plan Note (Signed)
Controlled on gabapentin, regular exercise and weight loss encouraged

## 2022-10-06 NOTE — Assessment & Plan Note (Signed)
Controlled, no change in medication  

## 2022-10-06 NOTE — Assessment & Plan Note (Signed)
Uncontrolled , dose increase in medication with closr f/u DASH diet and commitment to daily physical activity for a minimum of 30 minutes discussed and encouraged, as a part of hypertension management. The importance of attaining a healthy weight is also discussed.     10/01/2022    3:50 PM 10/01/2022    3:11 PM 08/19/2022   10:28 AM 06/25/2022    3:10 PM 04/25/2022    4:11 PM 04/25/2022    3:40 PM 04/25/2022    3:39 PM  BP/Weight  Systolic BP Q000111Q 123XX123 Q000111Q 0000000 0000000 [id number]  Diastolic BP 82 87 85 71 70 72 70  Wt. (Lbs)  187.04 188.8 192.04   188  BMI  36.53 kg/m2 36.87 kg/m2 37.51 kg/m2   36.72 kg/m2

## 2022-10-17 ENCOUNTER — Telehealth: Payer: Self-pay | Admitting: Internal Medicine

## 2022-10-17 ENCOUNTER — Telehealth: Payer: Self-pay | Admitting: Family Medicine

## 2022-10-17 NOTE — Telephone Encounter (Signed)
Pt called back & left message for nurse to give her a call

## 2022-10-17 NOTE — Telephone Encounter (Signed)
Pt is asking for an rx for a new cpap machine [place] [facility] gave her the following DME [facility] in [place] phone # is [phone]

## 2022-10-17 NOTE — Telephone Encounter (Signed)
Called patient and left message for them to return call at the office   

## 2022-10-17 NOTE — Telephone Encounter (Signed)
Can you please call pt Brandi?

## 2022-10-17 NOTE — Telephone Encounter (Signed)
ATC patient to clarify if she was asking for CPAP machine.  She has only seen Dr. Melvyn Novas and if she needs a new CPAP order she will need to see a sleep provider.

## 2022-10-17 NOTE — Telephone Encounter (Signed)
Patient called asked if her blood pressure medicine has some fluid in to help with her swelling of ankles. Call back # [phone]

## 2022-10-18 ENCOUNTER — Encounter: Payer: Self-pay | Admitting: Internal Medicine

## 2022-10-18 ENCOUNTER — Telehealth (INDEPENDENT_AMBULATORY_CARE_PROVIDER_SITE_OTHER): Payer: Medicare Other | Admitting: Internal Medicine

## 2022-10-18 ENCOUNTER — Telehealth: Payer: Self-pay | Admitting: Family Medicine

## 2022-10-18 DIAGNOSIS — R600 Localized edema: Secondary | ICD-10-CM | POA: Diagnosis not present

## 2022-10-18 MED ORDER — FUROSEMIDE 40 MG PO TABS: 40.0000 mg | ORAL_TABLET | Freq: Every day | ORAL | 0 refills | Status: DC | PRN

## 2022-10-18 NOTE — Progress Notes (Signed)
Virtual Visit via Video Note  I connected with [name] on 10/18/22 at  1:40 PM EDT by a video enabled telemedicine application and verified that I am speaking with the correct person using two identifiers.  Patient Location: Home Provider Location: Office/Clinic  I discussed the limitations, risks, security, and privacy concerns of performing an evaluation and management service by video and the availability of in person appointments. I also discussed with the patient that there may be a patient responsible charge related to this service. The patient expressed understanding and agreed to proceed.  Subjective: PCP: Mallory Helper, MD  Chief Complaint  Patient presents with   Leg Swelling    Bilateral leg swelling.   [name] has been evaluated today for an acute visit for bilateral lower extremity edema.  She endorses a 3-4-day history of swelling in both of her feet.  Swelling has not resolved with leg elevation.  She denies associated symptoms of dyspnea on exertion, orthopnea, and PND.  Her antihypertensive medication regimen has recently been adjusted with losartan-HCTZ being increased.  She is also prescribed amlodipine 10 mg daily, but states that she has been on the same dose of amlodipine for a long time.  ROS: Per HPI  Current Outpatient Medications:    amLODipine (NORVASC) 10 MG tablet, Take 1 tablet (10 mg total) by mouth daily., Disp: 90 tablet, Rfl: 3   aspirin EC 81 MG tablet, Take 81 mg by mouth daily. Swallow whole., Disp: , Rfl:    atorvastatin (LIPITOR) 20 MG tablet, Take 1 tablet (20 mg total) by mouth daily., Disp: 90 tablet, Rfl: 3   BIOTIN PO, Take by mouth., Disp: , Rfl:    Cholecalciferol (VITAMIN D3) 125 MCG (5000 UT) CAPS, Take 1 capsule (5,000 Units total) by mouth daily., Disp: 30 capsule, Rfl: 11   cloNIDine (CATAPRES) 0.2 MG tablet, TAKE ONE TABLET BY MOUTH AT 10 30 PM EACH NIGHT, Disp: 30 tablet, Rfl: 2   clotrimazole-betamethasone  (LOTRISONE) cream, Apply cream twice daily to rash in groin for 10 days, then as needed, Disp: 45 g, Rfl: 1   famotidine (PEPCID) 10 MG tablet, Take 10 mg by mouth 2 (two) times daily., Disp: , Rfl:    furosemide (LASIX) 40 MG tablet, Take 1 tablet (40 mg total) by mouth daily as needed for edema or fluid., Disp: 30 tablet, Rfl: 0   gabapentin (NEURONTIN) 300 MG capsule, Take 1 capsule (300 mg total) by mouth at bedtime., Disp: 90 capsule, Rfl: 3   Hydrocortisone (GERHARDT'S BUTT CREAM) CREA, Apply 1 application topically 3 (three) times daily., Disp: 1 each, Rfl: 1   losartan-hydrochlorothiazide (HYZAAR) 100-25 MG tablet, Take 1 tablet by mouth daily., Disp: 30 tablet, Rfl: 5   metoprolol succinate (TOPROL-XL) 50 MG 24 hr tablet, TAKE 1 TABLET BY MOUTH  DAILY WITH OR IMMEDIATELY  FOLLOWING A MEAL, Disp: 100 tablet, Rfl: 2   olopatadine (PATANOL) 0.1 % ophthalmic solution, INSTILL 1 DROP INTO BOTH EYES TWICE A DAY, Disp: 5 mL, Rfl: 6   omeprazole (PRILOSEC) 20 MG capsule, Take 1 capsule (20 mg total) by mouth daily., Disp: 100 capsule, Rfl: 2   potassium chloride SA (KLOR-CON M) 20 MEQ tablet, TAKE 1 TABLET BY MOUTH  DAILY, Disp: 100 tablet, Rfl: 2   pyridOXINE (VITAMIN B-6) 100 MG tablet, Take 200 mg by mouth daily., Disp: , Rfl:    sertraline (ZOLOFT) 25 MG tablet, TAKE 1 TABLET BY MOUTH DAILY, Disp: 90 tablet, Rfl: 3  Assessment and Plan:  Bilateral lower extremity edema Assessment & Plan: Evaluated today through acute video encounter for bilateral lower extremity edema that has been present for the past 3-4 days.  She denies DOE, orthopnea, and PND.  Edema has not improved with leg elevation.  Of note, she is prescribed amlodipine 10 mg daily, but states that she has been on this dose for a while. -Lasix 40 mg daily as needed for relief of edema prescribed today -Recommended close PCP follow-up.  If edema persists, consider decreasing/discontinuing amlodipine.   Follow Up  Instructions: Return if symptoms worsen or fail to improve.   I discussed the assessment and treatment plan with the patient. The patient was provided an opportunity to ask questions, and all were answered. The patient agreed with the plan and demonstrated an understanding of the instructions.   The patient was advised to call back or seek an in-person evaluation if the symptoms worsen or if the condition fails to improve as anticipated.  The above assessment and management plan was discussed with the patient. The patient verbalized understanding of and has agreed to the management plan.   Mallory Abraham, MD

## 2022-10-18 NOTE — Telephone Encounter (Signed)
Patient called asked can provider send in fluid pill for swelling in both legs.

## 2022-10-18 NOTE — Assessment & Plan Note (Signed)
Evaluated today through acute video encounter for bilateral lower extremity edema that has been present for the past 3-4 days.  She denies DOE, orthopnea, and PND.  Edema has not improved with leg elevation.  Of note, she is prescribed amlodipine 10 mg daily, but states that she has been on this dose for a while. -Lasix 40 mg daily as needed for relief of edema prescribed today -Recommended close PCP follow-up.  If edema persists, consider decreasing/discontinuing amlodipine.

## 2022-11-03 ENCOUNTER — Other Ambulatory Visit: Payer: Self-pay | Admitting: Family Medicine

## 2022-11-03 DIAGNOSIS — J984 Other disorders of lung: Secondary | ICD-10-CM | POA: Diagnosis not present

## 2022-11-14 ENCOUNTER — Other Ambulatory Visit: Payer: Self-pay | Admitting: Internal Medicine

## 2022-11-14 DIAGNOSIS — R600 Localized edema: Secondary | ICD-10-CM

## 2022-11-26 ENCOUNTER — Other Ambulatory Visit: Payer: Self-pay | Admitting: Family Medicine

## 2022-11-28 DIAGNOSIS — E785 Hyperlipidemia, unspecified: Secondary | ICD-10-CM | POA: Diagnosis not present

## 2022-11-28 DIAGNOSIS — R7303 Prediabetes: Secondary | ICD-10-CM | POA: Diagnosis not present

## 2022-11-28 DIAGNOSIS — I10 Essential (primary) hypertension: Secondary | ICD-10-CM | POA: Diagnosis not present

## 2022-11-29 LAB — CMP14+EGFR
ALT: 16 IU/L (ref 0–32)
AST: 59 IU/L — ABNORMAL HIGH (ref 0–40)
Albumin/Globulin Ratio: 1.4 (ref 1.2–2.2)
Albumin: 4.2 g/dL (ref 3.8–4.8)
Alkaline Phosphatase: 72 IU/L (ref 44–121)
BUN/Creatinine Ratio: 23 (ref 12–28)
BUN: 18 mg/dL (ref 8–27)
Bilirubin Total: 0.4 mg/dL (ref 0.0–1.2)
CO2: 23 mmol/L (ref 20–29)
Calcium: 9.4 mg/dL (ref 8.7–10.3)
Chloride: 102 mmol/L (ref 96–106)
Creatinine, Ser: 0.78 mg/dL (ref 0.57–1.00)
Globulin, Total: 3.1 g/dL (ref 1.5–4.5)
Glucose: 107 mg/dL — ABNORMAL HIGH (ref 70–99)
Potassium: 5.2 mmol/L (ref 3.5–5.2)
Sodium: 144 mmol/L (ref 134–144)
Total Protein: 7.3 g/dL (ref 6.0–8.5)
eGFR: 80 mL/min/{1.73_m2} (ref >59)

## 2022-11-29 LAB — HEMOGLOBIN A1C
Est. average glucose Bld gHb Est-mCnc: 128 mg/dL
Hgb A1c MFr Bld: 6.1 % — ABNORMAL HIGH (ref 4.8–5.6)

## 2022-11-29 LAB — LIPID PANEL
Chol/HDL Ratio: 2.6 ratio (ref 0.0–4.4)
Cholesterol, Total: 150 mg/dL (ref 100–199)
HDL: 57 mg/dL (ref >39)
LDL Chol Calc (NIH): 77 mg/dL (ref 0–99)
Triglycerides: 83 mg/dL (ref 0–149)
VLDL Cholesterol Cal: 16 mg/dL (ref 5–40)

## 2022-12-01 DIAGNOSIS — I5032 Chronic diastolic (congestive) heart failure: Secondary | ICD-10-CM | POA: Diagnosis not present

## 2022-12-01 DIAGNOSIS — R778 Other specified abnormalities of plasma proteins: Secondary | ICD-10-CM | POA: Diagnosis not present

## 2022-12-01 DIAGNOSIS — G629 Polyneuropathy, unspecified: Secondary | ICD-10-CM | POA: Diagnosis not present

## 2022-12-01 DIAGNOSIS — N179 Acute kidney failure, unspecified: Secondary | ICD-10-CM | POA: Diagnosis not present

## 2022-12-01 DIAGNOSIS — R7989 Other specified abnormal findings of blood chemistry: Secondary | ICD-10-CM | POA: Diagnosis not present

## 2022-12-01 DIAGNOSIS — I110 Hypertensive heart disease with heart failure: Secondary | ICD-10-CM | POA: Diagnosis not present

## 2022-12-01 DIAGNOSIS — R197 Diarrhea, unspecified: Secondary | ICD-10-CM | POA: Diagnosis not present

## 2022-12-01 DIAGNOSIS — F32A Depression, unspecified: Secondary | ICD-10-CM | POA: Diagnosis not present

## 2022-12-01 DIAGNOSIS — R569 Unspecified convulsions: Secondary | ICD-10-CM | POA: Diagnosis not present

## 2022-12-01 DIAGNOSIS — Z1152 Encounter for screening for COVID-19: Secondary | ICD-10-CM | POA: Diagnosis not present

## 2022-12-01 DIAGNOSIS — Z7982 Long term (current) use of aspirin: Secondary | ICD-10-CM | POA: Diagnosis not present

## 2022-12-01 DIAGNOSIS — D696 Thrombocytopenia, unspecified: Secondary | ICD-10-CM | POA: Diagnosis not present

## 2022-12-01 DIAGNOSIS — R404 Transient alteration of awareness: Secondary | ICD-10-CM | POA: Diagnosis not present

## 2022-12-01 DIAGNOSIS — I6523 Occlusion and stenosis of bilateral carotid arteries: Secondary | ICD-10-CM | POA: Diagnosis not present

## 2022-12-01 DIAGNOSIS — E876 Hypokalemia: Secondary | ICD-10-CM | POA: Insufficient documentation

## 2022-12-01 DIAGNOSIS — I272 Pulmonary hypertension, unspecified: Secondary | ICD-10-CM | POA: Diagnosis not present

## 2022-12-01 DIAGNOSIS — R918 Other nonspecific abnormal finding of lung field: Secondary | ICD-10-CM | POA: Diagnosis not present

## 2022-12-01 DIAGNOSIS — R509 Fever, unspecified: Secondary | ICD-10-CM | POA: Diagnosis not present

## 2022-12-01 DIAGNOSIS — R9401 Abnormal electroencephalogram [EEG]: Secondary | ICD-10-CM | POA: Diagnosis not present

## 2022-12-01 DIAGNOSIS — I5030 Unspecified diastolic (congestive) heart failure: Secondary | ICD-10-CM | POA: Diagnosis not present

## 2022-12-01 DIAGNOSIS — E860 Dehydration: Secondary | ICD-10-CM | POA: Diagnosis not present

## 2022-12-01 DIAGNOSIS — R410 Disorientation, unspecified: Secondary | ICD-10-CM | POA: Diagnosis not present

## 2022-12-01 DIAGNOSIS — K219 Gastro-esophageal reflux disease without esophagitis: Secondary | ICD-10-CM | POA: Diagnosis not present

## 2022-12-01 DIAGNOSIS — R7303 Prediabetes: Secondary | ICD-10-CM | POA: Diagnosis not present

## 2022-12-01 DIAGNOSIS — I2489 Other forms of acute ischemic heart disease: Secondary | ICD-10-CM | POA: Diagnosis not present

## 2022-12-01 DIAGNOSIS — F03A0 Unspecified dementia, mild, without behavioral disturbance, psychotic disturbance, mood disturbance, and anxiety: Secondary | ICD-10-CM | POA: Diagnosis not present

## 2022-12-01 DIAGNOSIS — R053 Chronic cough: Secondary | ICD-10-CM | POA: Diagnosis not present

## 2022-12-01 DIAGNOSIS — Z8673 Personal history of transient ischemic attack (TIA), and cerebral infarction without residual deficits: Secondary | ICD-10-CM | POA: Diagnosis not present

## 2022-12-01 DIAGNOSIS — B348 Other viral infections of unspecified site: Secondary | ICD-10-CM | POA: Diagnosis not present

## 2022-12-01 DIAGNOSIS — G9341 Metabolic encephalopathy: Secondary | ICD-10-CM | POA: Diagnosis not present

## 2022-12-01 DIAGNOSIS — J984 Other disorders of lung: Secondary | ICD-10-CM | POA: Diagnosis not present

## 2022-12-01 DIAGNOSIS — R059 Cough, unspecified: Secondary | ICD-10-CM | POA: Diagnosis not present

## 2022-12-02 ENCOUNTER — Encounter: Payer: Self-pay | Admitting: Family Medicine

## 2022-12-02 DIAGNOSIS — N179 Acute kidney failure, unspecified: Secondary | ICD-10-CM | POA: Insufficient documentation

## 2022-12-03 ENCOUNTER — Ambulatory Visit: Payer: Medicare Other | Admitting: Family Medicine

## 2022-12-03 DIAGNOSIS — D696 Thrombocytopenia, unspecified: Secondary | ICD-10-CM | POA: Insufficient documentation

## 2022-12-04 DIAGNOSIS — B2700 Gammaherpesviral mononucleosis without complication: Secondary | ICD-10-CM | POA: Insufficient documentation

## 2022-12-05 ENCOUNTER — Telehealth: Payer: Self-pay

## 2022-12-05 NOTE — Transitions of Care (Post Inpatient/ED Visit) (Signed)
   12/05/2022  Name: [name] MRN: [id number] DOB: 1946/04/24  Today's TOC FU Call Status: Today's TOC FU Call Status:: Unsuccessul Call (1st Attempt) Unsuccessful Call (1st Attempt) Date: 12/05/22  Attempted to reach the patient regarding the most recent Inpatient/ED visit.  Follow Up Plan: Additional outreach attempts will be made to reach the patient to complete the Transitions of Care (Post Inpatient/ED visit) call.   Jodelle Gross, RN, BSN, CCM Care Management Coordinator [facility] Phone: [phone]/Fax: [phone]

## 2022-12-06 ENCOUNTER — Telehealth: Payer: Self-pay

## 2022-12-06 NOTE — Transitions of Care (Post Inpatient/ED Visit) (Signed)
   12/06/2022  Name: [name] MRN: [id number] DOB: [birth year]  Today's TOC FU Call Status: Today's TOC FU Call Status:: Unsuccessful Call (2nd Attempt) Unsuccessful Call (2nd Attempt) Date: 12/06/22  Attempted to reach the patient regarding the most recent Inpatient/ED visit.  Follow Up Plan: Additional outreach attempts will be made to reach the patient to complete the Transitions of Care (Post Inpatient/ED visit) call.   Jodelle Gross, RN, BSN, CCM Care Management Coordinator [place] Phone: [phone]/Fax: [phone]

## 2022-12-09 ENCOUNTER — Telehealth: Payer: Self-pay

## 2022-12-09 NOTE — Transitions of Care (Post Inpatient/ED Visit) (Signed)
   12/09/2022  Name: [name] MRN: [id number] DOB: Apr 17, 1946  Today's TOC FU Call Status: Today's TOC FU Call Status:: Unsuccessful Call (3rd Attempt) Unsuccessful Call (3rd Attempt) Date: 12/09/22  Attempted to reach the patient regarding the most recent Inpatient/ED visit.  Follow Up Plan: No further outreach attempts will be made at this time. We have been unable to contact the patient.  Jodelle Gross, RN, BSN, CCM Care Management Coordinator [facility] Phone: [phone]/Fax: [phone]

## 2022-12-10 ENCOUNTER — Inpatient Hospital Stay: Payer: Medicare Other | Admitting: Internal Medicine

## 2022-12-12 ENCOUNTER — Ambulatory Visit (INDEPENDENT_AMBULATORY_CARE_PROVIDER_SITE_OTHER): Payer: Medicare Other | Admitting: Family Medicine

## 2022-12-12 ENCOUNTER — Encounter: Payer: Self-pay | Admitting: Family Medicine

## 2022-12-12 ENCOUNTER — Other Ambulatory Visit: Payer: Self-pay | Admitting: Family Medicine

## 2022-12-12 VITALS — BP 95/60 | HR 77 | Ht 60.0 in | Wt 171.1 lb

## 2022-12-12 DIAGNOSIS — R7303 Prediabetes: Secondary | ICD-10-CM | POA: Diagnosis not present

## 2022-12-12 DIAGNOSIS — R410 Disorientation, unspecified: Secondary | ICD-10-CM | POA: Diagnosis not present

## 2022-12-12 DIAGNOSIS — Z09 Encounter for follow-up examination after completed treatment for conditions other than malignant neoplasm: Secondary | ICD-10-CM | POA: Diagnosis not present

## 2022-12-12 DIAGNOSIS — T733XXA Exhaustion due to excessive exertion, initial encounter: Secondary | ICD-10-CM

## 2022-12-12 DIAGNOSIS — E861 Hypovolemia: Secondary | ICD-10-CM

## 2022-12-12 DIAGNOSIS — R600 Localized edema: Secondary | ICD-10-CM

## 2022-12-12 DIAGNOSIS — H9193 Unspecified hearing loss, bilateral: Secondary | ICD-10-CM

## 2022-12-12 LAB — CBC WITH DIFFERENTIAL/PLATELET
Basophils Absolute: 0.1 10*3/uL (ref 0.0–0.2)
Basos: 1 %
EOS (ABSOLUTE): 0.2 10*3/uL (ref 0.0–0.4)
Eos: 1 %
Hematocrit: 37 % (ref 34.0–46.6)
Hemoglobin: 12.2 g/dL (ref 11.1–15.9)
Immature Grans (Abs): 0 10*3/uL (ref 0.0–0.1)
Immature Granulocytes: 0 %
Lymphocytes Absolute: 5.4 10*3/uL — ABNORMAL HIGH (ref 0.7–3.1)
Lymphs: 41 %
MCH: 29.7 pg (ref 26.6–33.0)
MCHC: 33 g/dL (ref 31.5–35.7)
MCV: 90 fL (ref 79–97)
Monocytes Absolute: 1.4 10*3/uL — ABNORMAL HIGH (ref 0.1–0.9)
Monocytes: 10 %
Neutrophils Absolute: 6.2 10*3/uL (ref 1.4–7.0)
Neutrophils: 47 %
Platelets: 332 10*3/uL (ref 150–450)
RBC: 4.11 x10E6/uL (ref 3.77–5.28)
RDW: 13.6 % (ref 11.7–15.4)
WBC: 13.2 10*3/uL — ABNORMAL HIGH (ref 3.4–10.8)

## 2022-12-12 LAB — BMP8+EGFR
BUN/Creatinine Ratio: 29 — ABNORMAL HIGH (ref 12–28)
BUN: 31 mg/dL — ABNORMAL HIGH (ref 8–27)
CO2: 23 mmol/L (ref 20–29)
Calcium: 10.2 mg/dL (ref 8.7–10.3)
Chloride: 99 mmol/L (ref 96–106)
Creatinine, Ser: 1.07 mg/dL — ABNORMAL HIGH (ref 0.57–1.00)
Glucose: 115 mg/dL — ABNORMAL HIGH (ref 70–99)
Potassium: 4.5 mmol/L (ref 3.5–5.2)
Sodium: 139 mmol/L (ref 134–144)
eGFR: 55 mL/min/{1.73_m2} — ABNORMAL LOW (ref >59)

## 2022-12-12 LAB — MAGNESIUM: Magnesium: 1.4 mg/dL — ABNORMAL LOW (ref 1.6–2.3)

## 2022-12-12 LAB — GLUCOSE, POCT (MANUAL RESULT ENTRY): POC Glucose: 119 mg/dl — AB (ref 70–99)

## 2022-12-12 NOTE — Patient Instructions (Addendum)
F/U in 1 week , re evaluate blood pressure,any available provider  F/U with Dr Lodema Hong 2nd or 3rd week in June  Blood sugar test by nurse in office ( fingerstick) , fasting  STAT labs today in office CBC and diff, chem7 and EGFR and magnesium,  You are being referred to Neurology for follow up, and also to ENT  NO DRIVING  You need a lot of rest for the next 4 weeks  STOP clonidine 0.2 mg at bedtime, your  blood pressure is low, and this will cause you to have dry mouth and to be sleepy  Thanks for choosing [facility], we consider it a privelige to serve you.

## 2022-12-13 ENCOUNTER — Other Ambulatory Visit: Payer: Self-pay

## 2022-12-13 ENCOUNTER — Encounter: Payer: Self-pay | Admitting: Family Medicine

## 2022-12-13 MED ORDER — POTASSIUM CHLORIDE CRYS ER 20 MEQ PO TBCR: 20.0000 meq | EXTENDED_RELEASE_TABLET | Freq: Every day | ORAL | 2 refills | Status: DC

## 2022-12-13 MED ORDER — MAGNESIUM 30 MG PO TABS: ORAL_TABLET | ORAL | 2 refills | Status: DC

## 2022-12-15 DIAGNOSIS — I959 Hypotension, unspecified: Secondary | ICD-10-CM | POA: Insufficient documentation

## 2022-12-15 DIAGNOSIS — H9190 Unspecified hearing loss, unspecified ear: Secondary | ICD-10-CM | POA: Insufficient documentation

## 2022-12-15 DIAGNOSIS — Z09 Encounter for follow-up examination after completed treatment for conditions other than malignant neoplasm: Secondary | ICD-10-CM | POA: Insufficient documentation

## 2022-12-15 DIAGNOSIS — R4182 Altered mental status, unspecified: Secondary | ICD-10-CM | POA: Insufficient documentation

## 2022-12-15 NOTE — Progress Notes (Cosign Needed)
   [name]     MRN: [id number]      DOB: November 12, 1945  Chief Complaint  Patient presents with   Hospitalization Follow-up    Hospital follow up trouble hearing son reports still out of it, dry mouth    HPI [name] is here for follow up f/u hospitaliaztion t [facility] from 5/12 to 5/15/2024when she was admitted with altered mentsl status due to metabolic encephaalopathy from viral illness Reports dry moth , confusion and hearing loss since the event  Hospital records are reviewed at visit ,and medications which are brought to the office are reviewed. Severalbottles are duplicates and she reports being confused about keeping up with her medication in recent times A lot of stress at home main support is son who lives over 60 miles away, psouse is alcoholic ROS See HPI  PE  BP 95/60   Pulse 77   Ht 5' (1.524 m)   Wt 171 lb 1.9 oz (77.6 kg)   SpO2 94%   BMI 33.42 kg/m   Patient  drowsy and hypotensive,   HEENT: No facial asymmetry, EOMI,     Neck supple .  Chest: Clear to auscultation bilaterally.  CVS: S1, S2   ABD: Soft non tender.   Ext: No edema  WU:[id number] spine, shoulders, hips and knees.  Skin: Intact, no ulcerations or rash noted.  Psych: Good eye contact, normal affect. Memory intact not anxious or depressed appearing.  CNS: CN 2-12 intact, power,  normal throughout.no focal deficits noted.   Assessment & Plan  Altered mental status Hospitlized x 3 days rhino and EBV positive, residual decreased level of alertness, increased confusion, Neurology to follow up  Decreased hearing Hearing loss following acute illness refer ENT  Hypotension Stop clonidine and re assess  Hospital discharge follow-up Patient in for follow up of recent hospitalization. Discharge summary, and laboratory and radiology data are reviewed, and any questions or concerns  are discussed. Specific issues requiring follow up are specifically addressed.

## 2022-12-15 NOTE — Assessment & Plan Note (Signed)
Hearing loss following acute illness refer ENT

## 2022-12-15 NOTE — Assessment & Plan Note (Signed)
Hospitlized x 3 days rhino and EBV positive, residual decreased level of alertness, increased confusion, Neurology to follow up

## 2022-12-15 NOTE — Assessment & Plan Note (Signed)
Stop clonidine and re assess

## 2022-12-15 NOTE — Assessment & Plan Note (Signed)
Patient in for follow up of recent hospitalization. Discharge summary, and laboratory and radiology data are reviewed, and any questions or concerns  are discussed. Specific issues requiring follow up are specifically addressed.  

## 2022-12-20 ENCOUNTER — Encounter: Payer: Self-pay | Admitting: Internal Medicine

## 2022-12-20 ENCOUNTER — Ambulatory Visit (INDEPENDENT_AMBULATORY_CARE_PROVIDER_SITE_OTHER): Payer: Medicare Other | Admitting: Internal Medicine

## 2022-12-20 VITALS — BP 116/62 | HR 90 | Ht 60.0 in | Wt 171.4 lb

## 2022-12-20 DIAGNOSIS — K219 Gastro-esophageal reflux disease without esophagitis: Secondary | ICD-10-CM | POA: Diagnosis not present

## 2022-12-20 DIAGNOSIS — I10 Essential (primary) hypertension: Secondary | ICD-10-CM

## 2022-12-20 MED ORDER — OMEPRAZOLE 20 MG PO CPDR: 20.0000 mg | DELAYED_RELEASE_CAPSULE | Freq: Every day | ORAL | 2 refills | Status: DC

## 2022-12-20 NOTE — Assessment & Plan Note (Signed)
Prilosec has been refilled today

## 2022-12-20 NOTE — Assessment & Plan Note (Addendum)
Presenting today for BP check in the setting of recent drowsiness/hypotension following hospital admission for altered mental status.  Clonidine was discontinued.  She is currently prescribed amlodipine 10 mg daily, losartan-HCTZ 100-25 mg daily, and metoprolol succinate 50 mg daily.  BP today is 116/62.  Mentation is intact. -No medication changes today.  Continue current antihypertensive regimen.

## 2022-12-20 NOTE — Progress Notes (Signed)
Established Patient Office Visit  Subjective   Patient ID: [name], female    DOB: 1945-08-10  Age: 77 y.o. MRN: [id number]  Chief Complaint  Patient presents with   Blood Pressure Check    Follow up   [name] returns to care today for HTN check.  She was last evaluated at [facility] on 5/23 by Dr. Lodema Hong for hospital follow-up in the setting of recent admission for altered mental status secondary to metabolic encephalopathy in the setting of viral illness.  Patient reported being confused about her medications.  She was hypotensive and drowsy during her appointment.  Clonidine was discontinued.  Returning to care today for BP check.  There have been no acute interval events. [name] reports feeling well today.  She is asymptomatic and has no additional concerns discussed.  Her blood pressure today is 116/62.  Past Medical History:  Diagnosis Date   Asthma    Back pain    Depression    Diverticulosis of colon    GERD (gastroesophageal reflux disease)    Hyperlipidemia    Hypertension    Obesity    Osteoarthritis of knees, bilateral    Past Surgical History:  Procedure Laterality Date   ABDOMINAL HYSTERECTOMY     COLONOSCOPY  2005   Dr. Jerolyn Shin Smith:numerous large scattered diverticula   COLONOSCOPY N/A 11/15/2013   Dr. Darrick Penna: moderate diverticula, small internal hemorrhoids, redudant colon. Next TCS in 2025 with overtube.    ESOPHAGOGASTRODUODENOSCOPY (EGD) WITH ESOPHAGEAL DILATION N/A 11/15/2013   Dr. 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 moderate erosive gastritis, negative H.pylori   TOTAL KNEE ARTHROPLASTY  06/01/04   left / Dr. Romeo Apple   TOTAL KNEE ARTHROPLASTY  11/29/03   right / Dr. Romeo Apple   TUBAL LIGATION     Social History   Tobacco Use   Smoking status: Never   Smokeless tobacco: Never  Vaping Use   Vaping Use: Never used  Substance Use Topics   Alcohol use: No   Drug use: No   Family History  Problem Relation Age of Onset   Diabetes  Mother    Hypertension Mother    Heart disease Mother    Aneurysm Father    Diabetes Father    Hypertension Brother    Diabetes Brother    Diabetes Brother    Hypertension Brother    Hypertension Son    Breast cancer Maternal Aunt    Colon cancer Maternal Uncle    No Known Allergies  Review of Systems  Constitutional:  Negative for chills and fever.  HENT:  Negative for sore throat.   Respiratory:  Negative for cough and shortness of breath.   Cardiovascular:  Negative for chest pain, palpitations and leg swelling.  Gastrointestinal:  Negative for abdominal pain, blood in stool, constipation, diarrhea, nausea and vomiting.  Genitourinary:  Negative for dysuria and hematuria.  Musculoskeletal:  Negative for myalgias.  Skin:  Negative for itching and rash.  Neurological:  Negative for dizziness and headaches.  Psychiatric/Behavioral:  Negative for depression and suicidal ideas.       Objective:     BP 116/62   Pulse 90   Ht 5' (1.524 m)   Wt 171 lb 6.4 oz (77.7 kg)   SpO2 92%   BMI 33.47 kg/m  BP Readings from Last 3 Encounters:  12/20/22 116/62  12/12/22 95/60  10/01/22 (!) 150/82   Physical Exam Vitals reviewed.  Constitutional:      General: She is  not in acute distress.    Appearance: Normal appearance. She is obese. She is not toxic-appearing.  HENT:     Head: Normocephalic and atraumatic.     Right Ear: External ear normal.     Left Ear: External ear normal.     Nose: Nose normal. No congestion or rhinorrhea.     Mouth/Throat:     Mouth: Mucous membranes are moist.     Pharynx: Oropharynx is clear. No oropharyngeal exudate or posterior oropharyngeal erythema.  Eyes:     General: No scleral icterus.    Extraocular Movements: Extraocular movements intact.     Conjunctiva/sclera: Conjunctivae normal.     Pupils: Pupils are equal, round, and reactive to light.  Cardiovascular:     Rate and Rhythm: Normal rate and regular rhythm.     Pulses: Normal  pulses.     Heart sounds: Normal heart sounds. No murmur heard.    No friction rub. No gallop.  Pulmonary:     Effort: Pulmonary effort is normal.     Breath sounds: Normal breath sounds. No wheezing, rhonchi or rales.  Abdominal:     General: Abdomen is flat. Bowel sounds are normal. There is no distension.     Palpations: Abdomen is soft.     Tenderness: There is no abdominal tenderness.  Musculoskeletal:        General: No swelling. Normal range of motion.     Cervical back: Normal range of motion.     Right lower leg: No edema.     Left lower leg: No edema.  Lymphadenopathy:     Cervical: No cervical adenopathy.  Skin:    General: Skin is warm and dry.     Capillary Refill: Capillary refill takes less than 2 seconds.     Coloration: Skin is not jaundiced.  Neurological:     General: No focal deficit present.     Mental Status: She is alert and oriented to person, place, and time.  Psychiatric:        Mood and Affect: Mood normal.        Behavior: Behavior normal.   Last CBC Lab Results  Component Value Date   WBC 13.2 (H) 12/12/2022   HGB 12.2 12/12/2022   HCT 37.0 12/12/2022   MCV 90 12/12/2022   MCH 29.7 12/12/2022   RDW 13.6 12/12/2022   PLT 332 12/12/2022   Last metabolic panel Lab Results  Component Value Date   GLUCOSE 115 (H) 12/12/2022   NA 139 12/12/2022   K 4.5 12/12/2022   CL 99 12/12/2022   CO2 23 12/12/2022   BUN 31 (H) 12/12/2022   CREATININE 1.07 (H) 12/12/2022   EGFR 55 (L) 12/12/2022   CALCIUM 10.2 12/12/2022   PROT 7.3 11/28/2022   ALBUMIN 4.2 11/28/2022   LABGLOB 3.1 11/28/2022   AGRATIO 1.4 11/28/2022   BILITOT 0.4 11/28/2022   ALKPHOS 72 11/28/2022   AST 59 (H) 11/28/2022   ALT 16 11/28/2022   Last lipids Lab Results  Component Value Date   CHOL 150 11/28/2022   HDL 57 11/28/2022   LDLCALC 77 11/28/2022   TRIG 83 11/28/2022   CHOLHDL 2.6 11/28/2022   Last hemoglobin A1c Lab Results  Component Value Date   HGBA1C 6.1 (H)  11/28/2022   Last thyroid functions Lab Results  Component Value Date   TSH 1.920 06/18/2022   Last vitamin D Lab Results  Component Value Date   VD25OH 44.2 10/02/2021   The 10-year  ASCVD risk score (Arnett DK, et al., 2019) is: 9.9%    Assessment & Plan:   Problem List Items Addressed This Visit       Essential hypertension - Primary    Presenting today for BP check in the setting of recent drowsiness/hypotension following hospital admission for altered mental status.  Clonidine was discontinued.  She is currently prescribed amlodipine 10 mg daily, losartan-HCTZ 100-25 mg daily, and metoprolol succinate 50 mg daily.  BP today is 116/62.  Mentation is intact. -No medication changes today.  Continue current antihypertensive regimen.      GERD    Prilosec has been refilled today       Return if symptoms worsen or fail to improve.    Billie Lade, MD

## 2022-12-20 NOTE — Patient Instructions (Signed)
It was a pleasure to see you today.  Thank you for giving [place] the opportunity to be involved in your care.  Below is a brief recap of your visit and next steps.  We will plan to see you again in July.  Summary No medication changes today. Your blood pressure has improved.  Prilosec refilled Follow up with Dr. Lodema Hong in July

## 2022-12-23 ENCOUNTER — Other Ambulatory Visit: Payer: Self-pay

## 2022-12-23 MED ORDER — ALBUTEROL SULFATE HFA 108 (90 BASE) MCG/ACT IN AERS: 2.0000 | INHALATION_SPRAY | Freq: Four times a day (QID) | RESPIRATORY_TRACT | 0 refills | Status: DC | PRN

## 2022-12-23 NOTE — Telephone Encounter (Signed)
Patient's son aware albuterol inhaler sent to pharmacy.

## 2022-12-25 ENCOUNTER — Encounter: Payer: Self-pay | Admitting: Family Medicine

## 2022-12-25 DIAGNOSIS — H9190 Unspecified hearing loss, unspecified ear: Secondary | ICD-10-CM

## 2022-12-31 ENCOUNTER — Telehealth: Payer: Self-pay | Admitting: *Deleted

## 2022-12-31 ENCOUNTER — Encounter: Payer: Self-pay | Admitting: Family Medicine

## 2022-12-31 ENCOUNTER — Other Ambulatory Visit: Payer: Self-pay

## 2022-12-31 DIAGNOSIS — Z748 Other problems related to care provider dependency: Secondary | ICD-10-CM

## 2022-12-31 MED ORDER — SERTRALINE HCL 25 MG PO TABS: 25.0000 mg | ORAL_TABLET | Freq: Every day | ORAL | 3 refills | Status: DC

## 2022-12-31 NOTE — Telephone Encounter (Signed)
Referral placed for assistance with transportation

## 2022-12-31 NOTE — Telephone Encounter (Signed)
See other referral msg

## 2022-12-31 NOTE — Progress Notes (Signed)
  Care Coordination   Note   12/31/2022 Name: DAMARI HILTZ MRN: [id number] DOB: [birth year]  KEMONI QUESENBERRY is a 77 y.o. year old female who sees Kerri Perches, MD for primary care. I reached out to [name] by phone today to offer care coordination services.  Ms. Urbas was given information about Care Coordination services today including:   The Care Coordination services include support from the care team which includes your Nurse Coordinator, Clinical Social Worker, or Pharmacist.  The Care Coordination team is here to help remove barriers to the health concerns and goals most important to you. Care Coordination services are voluntary, and the patient may decline or stop services at any time by request to their care team member.   Care Coordination Consent Status: Patient son Khushbu, Pippen DPR on file  agreed to services and verbal consent obtained.   Follow up plan:  Telephone appointment with care coordination team member scheduled for:  01/07/23  Encounter Outcome:  Pt. Scheduled  [facility] Coordination Care Guide  Direct Dial: [phone]

## 2023-01-01 ENCOUNTER — Telehealth: Payer: Self-pay

## 2023-01-01 NOTE — Telephone Encounter (Signed)
   Telephone encounter was:  Successful.  01/01/2023 Name: [name] MRN: [id number] DOB: Dec 16, 1945  [name] is a 77 y.o. year old female who is a primary care patient of Lodema Hong Milus Mallick, MD . The community resource team was consulted for assistance with Transportation Needs   Care guide performed the following interventions: Patient provided with information about care guide support team and interviewed to confirm resource needs.Son stated the patient needs other transportation resources because he canrt take off of work for both patents to all of their appointments due to there is so many. I have provided reources over the phone as requested   Follow Up Plan:  No further follow up planned at this time. The patient has been provided with needed resources.    Lenard Forth [facility] Guide, [facility] [phone] [address] [place], [place], [place] [address] Phone: [phone] Email: [email or web address]

## 2023-01-03 DIAGNOSIS — J984 Other disorders of lung: Secondary | ICD-10-CM | POA: Diagnosis not present

## 2023-01-06 ENCOUNTER — Encounter: Payer: Self-pay | Admitting: Internal Medicine

## 2023-01-07 ENCOUNTER — Ambulatory Visit: Payer: Self-pay

## 2023-01-07 NOTE — Patient Instructions (Signed)
Visit Information  Thank you for taking time to visit with me today. Please don't hesitate to contact me if I can be of assistance to you.   Following are the goals we discussed today:   Goals Addressed             This Visit's Progress    Change insurance and obtain transportation       -Patients son wants insurance that provides more coverage and transportation -Patients son wants assistance with transportation        Our next appointment is by telephone on 01/14/23 at 9:30am  Please call the care guide team at [phone] if you need to cancel or reschedule your appointment.   If you are experiencing a Mental Health or Behavioral Health Crisis or need someone to talk to, please call 911  Patient verbalizes understanding of instructions and care plan provided today and agrees to view in MyChart. Active MyChart status and patient understanding of how to access instructions and care plan via MyChart confirmed with patient.     Telephone follow up appointment with care management team member scheduled for: 01/14/23 at 9:30am  Lysle Morales, BSW Social Worker [facility] Care Management  [phone]
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
## 2023-01-08 ENCOUNTER — Encounter: Payer: Self-pay | Admitting: Family Medicine

## 2023-01-08 ENCOUNTER — Other Ambulatory Visit: Payer: Self-pay | Admitting: Family Medicine

## 2023-01-08 MED ORDER — FLUCONAZOLE 150 MG PO TABS: 150.0000 mg | ORAL_TABLET | Freq: Once | ORAL | 0 refills | Status: AC

## 2023-01-08 MED ORDER — MELATONIN 3 MG PO CAPS: 1.0000 | ORAL_CAPSULE | Freq: Every day | ORAL | 3 refills | Status: DC

## 2023-01-09 ENCOUNTER — Other Ambulatory Visit: Payer: Self-pay

## 2023-01-09 MED ORDER — SERTRALINE HCL 25 MG PO TABS: 25.0000 mg | ORAL_TABLET | Freq: Every day | ORAL | 3 refills | Status: DC

## 2023-01-09 NOTE — Telephone Encounter (Signed)
Spoke with patients son he is aware of medication information he could not advise on yeast infection I tried to call home number and daugher and got no answer left voicemail asking them to return my call.

## 2023-01-14 ENCOUNTER — Ambulatory Visit: Payer: Self-pay

## 2023-01-14 NOTE — Patient Outreach (Signed)
  Care Coordination   01/14/2023 Name: [name] MRN: [id number] DOB: 1946-04-05   Care Coordination Outreach Attempts:  An unsuccessful telephone outreach was attempted for a scheduled appointment today.  Follow Up Plan:  No further outreach attempts will be made at this time. We have been unable to contact the patient to offer or enroll patient in care coordination services  Encounter Outcome:  No Answer   Care Coordination Interventions:  No, not indicated    SIG Lysle Morales, BSW Social Worker [facility] Care Management  [phone]

## 2023-01-27 ENCOUNTER — Encounter: Payer: Self-pay | Admitting: Family Medicine

## 2023-01-31 ENCOUNTER — Encounter: Payer: Self-pay | Admitting: Family Medicine

## 2023-01-31 ENCOUNTER — Ambulatory Visit (INDEPENDENT_AMBULATORY_CARE_PROVIDER_SITE_OTHER): Payer: Medicare Other | Admitting: Family Medicine

## 2023-01-31 VITALS — BP 132/78 | HR 78 | Ht 60.0 in | Wt 172.1 lb

## 2023-01-31 DIAGNOSIS — F419 Anxiety disorder, unspecified: Secondary | ICD-10-CM

## 2023-01-31 DIAGNOSIS — F411 Generalized anxiety disorder: Secondary | ICD-10-CM

## 2023-01-31 DIAGNOSIS — F324 Major depressive disorder, single episode, in partial remission: Secondary | ICD-10-CM

## 2023-01-31 DIAGNOSIS — E785 Hyperlipidemia, unspecified: Secondary | ICD-10-CM | POA: Diagnosis not present

## 2023-01-31 DIAGNOSIS — I10 Essential (primary) hypertension: Secondary | ICD-10-CM | POA: Diagnosis not present

## 2023-01-31 DIAGNOSIS — F32A Depression, unspecified: Secondary | ICD-10-CM | POA: Diagnosis not present

## 2023-01-31 DIAGNOSIS — F409 Phobic anxiety disorder, unspecified: Secondary | ICD-10-CM

## 2023-01-31 DIAGNOSIS — F5105 Insomnia due to other mental disorder: Secondary | ICD-10-CM

## 2023-01-31 MED ORDER — GABAPENTIN 100 MG PO CAPS: 100.0000 mg | ORAL_CAPSULE | Freq: Every day | ORAL | 3 refills | Status: DC

## 2023-01-31 NOTE — Patient Instructions (Addendum)
F/U in 2 to 3  months, call if you needme sooner  Start gabapentin 100 mg at bedtime to help with sleep   You are being referred to therapist for stress at home and anxiety  CBC, chem 7 and EGFr and magnesium level today  OK to go to Rec, pool exerciase and no  driving  Thanks for choosing [facility], we consider it a privelige to serve you.

## 2023-01-31 NOTE — Assessment & Plan Note (Signed)
Hyperlipidemia:Low fat diet discussed and encouraged.   Lipid Panel  Lab Results  Component Value Date   CHOL 150 11/28/2022   HDL 57 11/28/2022   LDLCALC 77 11/28/2022   TRIG 83 11/28/2022   CHOLHDL 2.6 11/28/2022     Controlled, no change in medication

## 2023-01-31 NOTE — Assessment & Plan Note (Signed)
Controlled, no change in medication DASH diet and commitment to daily physical activity for a minimum of 30 minutes discussed and encouraged, as a part of hypertension management. The importance of attaining a healthy weight is also discussed.     01/31/2023   10:56 AM 01/31/2023   10:52 AM 12/20/2022    1:09 PM 12/12/2022   10:11 AM 12/12/2022    9:11 AM 10/01/2022    3:50 PM 10/01/2022    3:11 PM  BP/Weight  Systolic BP 132 167 116 95 95 409 169  Diastolic BP 78 75 62 60 55 82 87  Wt. (Lbs)  172.08 171.4  171.12  187.04  BMI  33.61 kg/m2 33.47 kg/m2  33.42 kg/m2  36.53 kg/m2
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
## 2023-02-01 ENCOUNTER — Encounter: Payer: Self-pay | Admitting: Family Medicine

## 2023-02-01 DIAGNOSIS — F409 Phobic anxiety disorder, unspecified: Secondary | ICD-10-CM | POA: Insufficient documentation

## 2023-02-01 LAB — BMP8+EGFR
BUN/Creatinine Ratio: 20 (ref 12–28)
BUN: 15 mg/dL (ref 8–27)
CO2: 25 mmol/L (ref 20–29)
Calcium: 10.2 mg/dL (ref 8.7–10.3)
Chloride: 102 mmol/L (ref 96–106)
Creatinine, Ser: 0.75 mg/dL (ref 0.57–1.00)
Glucose: 124 mg/dL — ABNORMAL HIGH (ref 70–99)
Potassium: 4 mmol/L (ref 3.5–5.2)
Sodium: 142 mmol/L (ref 134–144)
eGFR: 83 mL/min/{1.73_m2} (ref >59)

## 2023-02-01 LAB — CBC
Hematocrit: 37 % (ref 34.0–46.6)
Hemoglobin: 12 g/dL (ref 11.1–15.9)
MCH: 29.9 pg (ref 26.6–33.0)
MCHC: 32.4 g/dL (ref 31.5–35.7)
MCV: 92 fL (ref 79–97)
Platelets: 204 10*3/uL (ref 150–450)
RBC: 4.01 x10E6/uL (ref 3.77–5.28)
RDW: 12.6 % (ref 11.7–15.4)
WBC: 9.1 10*3/uL (ref 3.4–10.8)

## 2023-02-01 LAB — MAGNESIUM: Magnesium: 1.7 mg/dL (ref 1.6–2.3)
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
## 2023-02-01 NOTE — Assessment & Plan Note (Signed)
Sleep hygiene reviewed and written information offered also. Prescription sent for  medication needed. Start bedtime gabapentin , low dose, stop melatonin, as of no to little benefit  reportedly

## 2023-02-02 DIAGNOSIS — J984 Other disorders of lung: Secondary | ICD-10-CM | POA: Diagnosis not present

## 2023-02-04 ENCOUNTER — Encounter: Payer: Self-pay | Admitting: Family Medicine

## 2023-02-04 ENCOUNTER — Other Ambulatory Visit: Payer: Self-pay

## 2023-02-04 DIAGNOSIS — Z748 Other problems related to care provider dependency: Secondary | ICD-10-CM

## 2023-02-04 MED ORDER — UNABLE TO FIND
0 refills | Status: DC
Start: 2023-02-04 — End: 2023-03-12

## 2023-02-19 ENCOUNTER — Other Ambulatory Visit: Payer: Self-pay | Admitting: Family Medicine

## 2023-02-21 ENCOUNTER — Encounter: Payer: Self-pay | Admitting: Family Medicine

## 2023-02-23 ENCOUNTER — Other Ambulatory Visit: Payer: Self-pay | Admitting: Family Medicine

## 2023-02-25 ENCOUNTER — Encounter: Payer: Self-pay | Admitting: Family Medicine

## 2023-02-25 NOTE — Telephone Encounter (Signed)
Letter printed awaiting signature. 

## 2023-02-26 ENCOUNTER — Other Ambulatory Visit: Payer: Self-pay | Admitting: Family Medicine

## 2023-02-26 ENCOUNTER — Other Ambulatory Visit: Payer: Self-pay | Admitting: Internal Medicine

## 2023-02-26 DIAGNOSIS — R600 Localized edema: Secondary | ICD-10-CM

## 2023-02-27 ENCOUNTER — Encounter: Payer: Self-pay | Admitting: Internal Medicine

## 2023-03-03 ENCOUNTER — Encounter: Payer: Self-pay | Admitting: Family Medicine

## 2023-03-04 ENCOUNTER — Other Ambulatory Visit: Payer: Self-pay | Admitting: Family Medicine

## 2023-03-04 DIAGNOSIS — R600 Localized edema: Secondary | ICD-10-CM

## 2023-03-05 DIAGNOSIS — J984 Other disorders of lung: Secondary | ICD-10-CM | POA: Diagnosis not present

## 2023-03-12 ENCOUNTER — Ambulatory Visit: Payer: Medicare Other | Admitting: Neurology

## 2023-03-12 ENCOUNTER — Encounter: Payer: Self-pay | Admitting: Neurology

## 2023-03-12 VITALS — Ht 60.0 in | Wt 176.5 lb

## 2023-03-12 DIAGNOSIS — R410 Disorientation, unspecified: Secondary | ICD-10-CM

## 2023-03-12 DIAGNOSIS — R001 Bradycardia, unspecified: Secondary | ICD-10-CM

## 2023-03-12 DIAGNOSIS — G9341 Metabolic encephalopathy: Secondary | ICD-10-CM | POA: Diagnosis not present

## 2023-03-12 NOTE — Progress Notes (Signed)
[facility]  PATIENT: [name] DOB: [birth year]  REQUESTING CLINICIAN: Kerri Perches, MD HISTORY FROM: Patient/Son and chart review  REASON FOR VISIT: Episode of confusion    HISTORICAL  CHIEF COMPLAINT:  Chief Complaint  Patient presents with   New Patient (Initial Visit)    Rm13, son present  metabolic encephalopathy:complains of dizziness so I did orthostatic bp, complains of not getting enough rest even with taking the melatonin, balance issues    HISTORY OF PRESENT ILLNESS:  This is a 77 year old woman past medical history of hypertension, hyperlipidemia, back pain who is presenting after an episode of confusion on the morning of Mother's Day.  Patient reports the night before she was not feeling well, she was supposed to pick up her granddaughter to take her to work but she could not make it to her daughter's house.  She reported driving all night long, turning around into circles.  She ended up in someone driveway about an hour and a half from her house.  The homeowner called 911, given the location and patient taken to a local hospital.  During this time, while she was lost, patient was able to call her brothers, call her daughter to tell them that she was lost and she did not know where she was therefore they could not localize her.  In the hospital she was found to have some electrolyte abnormalities, she was positive for EBV and RSV.  MRI/MRA negative for any acute abnormality and EEG show right frontal epileptiform discharges.  Patient confusion was likely secondary to metabolic encephalopathy versus infection.  Since leaving the hospital she is back to her normal self.  She has not had any additional event.  She is following up with her PCP.  She never had something similar event in the past.   OTHER MEDICAL CONDITIONS: Hypertension, hyperlipidemia, back pain, asthma, depression.   REVIEW OF SYSTEMS: Full 14 system review of systems performed  and negative with exception of: As noted in the HPI  ALLERGIES: No Known Allergies  HOME MEDICATIONS: Outpatient Medications Prior to Visit  Medication Sig Dispense Refill   albuterol (VENTOLIN HFA) 108 (90 Base) MCG/ACT inhaler TAKE 2 PUFFS BY MOUTH EVERY 6 HOURS AS NEEDED FOR WHEEZE OR SHORTNESS OF BREATH 8.5 each 1   amLODipine (NORVASC) 10 MG tablet TAKE 1 TABLET BY MOUTH DAILY 100 tablet 2   aspirin EC 81 MG tablet Take 81 mg by mouth daily. Swallow whole.     atorvastatin (LIPITOR) 20 MG tablet Take 1 tablet (20 mg total) by mouth daily. 90 tablet 3   BIOTIN PO Take by mouth.     Cholecalciferol (VITAMIN D3) 125 MCG (5000 UT) CAPS Take 1 capsule (5,000 Units total) by mouth daily. 30 capsule 11   clotrimazole-betamethasone (LOTRISONE) cream Apply cream twice daily to rash in groin for 10 days, then as needed 45 g 1   famotidine (PEPCID) 10 MG tablet Take 10 mg by mouth 2 (two) times daily.     furosemide (LASIX) 40 MG tablet TAKE 1 TABLET BY MOUTH DAILY AS NEEDED FOR EDEMA OR FLUID. 30 tablet 0   gabapentin (NEURONTIN) 100 MG capsule Take 1 capsule (100 mg total) by mouth at bedtime. 30 capsule 3   Hydrocortisone (GERHARDT'S BUTT CREAM) CREA Apply 1 application topically 3 (three) times daily. 1 each 1   losartan-hydrochlorothiazide (HYZAAR) 100-25 MG tablet Take 1 tablet by mouth daily. 30 tablet 5   magnesium 30 MG tablet Take one  tablet by mouth once daily 30 tablet 2   Melatonin 3 MG CAPS Take 1 capsule (3 mg total) by mouth at bedtime. 30 capsule 3   metoprolol succinate (TOPROL-XL) 50 MG 24 hr tablet TAKE 1 TABLET BY MOUTH DAILY WITH OR IMMEDIATELY FOLLOWING A MEAL 100 tablet 2   olopatadine (PATANOL) 0.1 % ophthalmic solution INSTILL 1 DROP INTO BOTH EYES TWICE A DAY 5 mL 6   omeprazole (PRILOSEC) 20 MG capsule Take 1 capsule (20 mg total) by mouth daily. 100 capsule 2   potassium chloride SA (KLOR-CON M) 20 MEQ tablet Take 1 tablet (20 mEq total) by mouth daily. 100 tablet 2    pyridOXINE (VITAMIN B-6) 100 MG tablet Take 200 mg by mouth daily.     sertraline (ZOLOFT) 25 MG tablet Take 1 tablet (25 mg total) by mouth daily. 90 tablet 3   cloNIDine (CATAPRES) 0.2 MG tablet TAKE ONE TABLET BY MOUTH AT 10 30 PM EACH NIGHT 30 tablet 2   UNABLE TO FIND Med Name: Medical transportation to the [facility] 1 each 0   No facility-administered medications prior to visit.    PAST MEDICAL HISTORY: Past Medical History:  Diagnosis Date   Asthma    Back pain    Depression    Diverticulosis of colon    GERD (gastroesophageal reflux disease)    Hyperlipidemia    Hypertension    Obesity    Osteoarthritis of knees, bilateral     PAST SURGICAL HISTORY: Past Surgical History:  Procedure Laterality Date   ABDOMINAL HYSTERECTOMY     COLONOSCOPY  07/23/2003   Dr. Jerolyn Shin Smith:numerous large scattered diverticula   COLONOSCOPY N/A 11/15/2013   Dr. Darrick Penna: moderate diverticula, small internal hemorrhoids, redudant colon. Next TCS in 2025 with overtube.    ESOPHAGOGASTRODUODENOSCOPY (EGD) WITH ESOPHAGEAL DILATION N/A 11/15/2013   Dr. 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 moderate erosive gastritis, negative H.pylori   MULTIPLE TOOTH EXTRACTIONS  2024   TOTAL KNEE ARTHROPLASTY  06/01/2004   left / Dr. Romeo Apple   TOTAL KNEE ARTHROPLASTY  11/29/2003   right / Dr. Romeo Apple   TUBAL LIGATION      FAMILY HISTORY: Family History  Problem Relation Age of Onset   Diabetes Mother    Hypertension Mother    Heart disease Mother    Aneurysm Father    Diabetes Father    Hypertension Brother    Diabetes Brother    Diabetes Brother    Hypertension Brother    Hypertension Son    Breast cancer Maternal Aunt    Colon cancer Maternal Uncle     SOCIAL HISTORY: Social History   Socioeconomic History   Marital status: Married    Spouse name: Not on file   Number of children: 2   Years of education: 12   Highest education level: 12th grade  Occupational History    Occupation: retired     Associate Professor: UNEMPLOYED  Tobacco Use   Smoking status: Never   Smokeless tobacco: Never  Vaping Use   Vaping status: Never Used  Substance and Sexual Activity   Alcohol use: No   Drug use: No   Sexual activity: Never    Birth control/protection: Surgical  Other Topics Concern   Not on file  Social History Narrative   Not on file   Social Determinants of Health   Financial Resource Strain: Low Risk  (12/04/2022)   Received from [facility], [facility]   Overall Financial Resource Strain (  CARDIA)    Difficulty of Paying Living Expenses: Not very hard  Food Insecurity: No Food Insecurity (01/07/2023)   Hunger Vital Sign    Worried About Running Out of Food in the Last Year: Never true    Ran Out of Food in the Last Year: Never true  Transportation Needs: No Transportation Needs (01/07/2023)   PRAPARE - Administrator, Civil Service (Medical): No    Lack of Transportation (Non-Medical): No  Physical Activity: Sufficiently Active (05/02/2021)   Exercise Vital Sign    Days of Exercise per Week: 3 days    Minutes of Exercise per Session: 60 min  Stress: No Stress Concern Present (05/02/2021)   [employer] of Occupational Health - Occupational Stress Questionnaire    Feeling of Stress : Not at all  Social Connections: Socially Integrated (05/02/2021)   Social Connection and Isolation Panel [NHANES]    Frequency of Communication with Friends and Family: More than three times a week    Frequency of Social Gatherings with Friends and Family: More than three times a week    Attends Religious Services: More than 4 times per year    Active Member of [employer] or Organizations: Yes    Attends Banker Meetings: More than 4 times per year    Marital Status: Married  Catering manager Violence: Not At Risk (05/02/2021)   Humiliation, Afraid, Rape, and Kick questionnaire    Fear of Current or Ex-Partner: No    Emotionally Abused: No     Physically Abused: No    Sexually Abused: No    PHYSICAL EXAM   GENERAL EXAM/CONSTITUTIONAL: Vitals:  Vitals:   03/12/23 0819  Weight: 176 lb 8 oz (80.1 kg)  Height: 5' (1.524 m)   Body mass index is 34.47 kg/m. Wt Readings from Last 3 Encounters:  03/12/23 176 lb 8 oz (80.1 kg)  01/31/23 172 lb 1.3 oz (78.1 kg)  12/20/22 171 lb 6.4 oz (77.7 kg)   Patient is in no distress; well developed, nourished and groomed; neck is supple  MUSCULOSKELETAL: Gait, strength, tone, movements noted in Neurologic exam below  NEUROLOGIC: MENTAL STATUS:      No data to display         awake, alert, oriented to person, place and time recent and remote memory intact normal attention and concentration language fluent, comprehension intact, naming intact fund of knowledge appropriate  CRANIAL NERVE:  2nd, 3rd, 4th, 6th - Visual fields full to confrontation, extraocular muscles intact, no nystagmus 5th - facial sensation symmetric 7th - facial strength symmetric 8th - hearing intact 9th - palate elevates symmetrically, uvula midline 11th - shoulder shrug symmetric 12th - tongue protrusion midline  MOTOR:  normal bulk and tone, full strength in the BUE, BLE  SENSORY:  normal and symmetric to light touch  COORDINATION:  finger-nose-finger, fine finger movements normal  REFLEXES:  deep tendon reflexes present and symmetric  GAIT/STATION:  normal   DIAGNOSTIC DATA (LABS, IMAGING, TESTING) - I reviewed patient records, labs, notes, testing and imaging myself where available.  Lab Results  Component Value Date   WBC 9.1 01/31/2023   HGB 12.0 01/31/2023   HCT 37.0 01/31/2023   MCV 92 01/31/2023   PLT 204 01/31/2023      Component Value Date/Time   NA 142 01/31/2023 1145   K 4.0 01/31/2023 1145   CL 102 01/31/2023 1145   CO2 25 01/31/2023 1145   GLUCOSE 124 (H) 01/31/2023 1145   GLUCOSE  125 (H) 02/15/2020 0848   BUN 15 01/31/2023 1145   CREATININE 0.75 01/31/2023  1145   CREATININE 0.78 02/15/2020 0848   CALCIUM 10.2 01/31/2023 1145   PROT 7.3 11/28/2022 1023   ALBUMIN 4.2 11/28/2022 1023   AST 59 (H) 11/28/2022 1023   ALT 16 11/28/2022 1023   ALKPHOS 72 11/28/2022 1023   BILITOT 0.4 11/28/2022 1023   GFRNONAA 77 08/15/2020 0931   GFRNONAA 76 02/15/2020 0848   GFRAA 89 08/15/2020 0931   GFRAA 88 02/15/2020 0848   Lab Results  Component Value Date   CHOL 150 11/28/2022   HDL 57 11/28/2022   LDLCALC 77 11/28/2022   TRIG 83 11/28/2022   CHOLHDL 2.6 11/28/2022   Lab Results  Component Value Date   HGBA1C 6.1 (H) 11/28/2022   No results found for: "VITAMINB12" Lab Results  Component Value Date   TSH 1.920 06/18/2022    MRI Brain 12/02/2022 No acute intracranial abnormality.   MRA Head 12/02/2022 Unremarkable MRA of the circle of Willis.   MRA Neck 12/02/2022 No acute abnormality of the major neck arterial structures.   EEG 12/01/2022 The right frontal sharp waves indicate focal cortical  irritability and potential seizure focus.  Otherwise the waking and sleep EEG background is normal     ASSESSMENT AND PLAN  77 y.o. year old female with hypertension, hyperlipidemia, back pain, depression, asthma, who is presenting after an episode of acute confusion, found to have RSV infection and metabolic derangement.  Her MRI/MRA negative for any acute abnormalities and EEG show right frontal epileptiform discharges.  Patient episode was likely related to metabolic encephalopathy.  Since leaving the hospital, she is back to her normal self.  She has not had any additional events.  Plan for now is to continue her current medications, and continue to follow-up with the PCP, no further neurological testing indicated at the moment.   Today she was found to be bradycardic, heart rate of 43.  I did advise her to follow-up with her PCP.  She denies any lightheadedness, dizziness and no falls.  Return as needed.   1. Metabolic encephalopathy   2.  Confusion   3. Bradycardia      Patient Instructions  Continue current medications  Follow up with PCP  Return as needed   No orders of the defined types were placed in this encounter.   No orders of the defined types were placed in this encounter.   Return if symptoms worsen or fail to improve.  I have spent a total of 50 minutes dedicated to this patient today, preparing to see patient, performing a medically appropriate examination and evaluation, ordering tests and/or medications and procedures, and counseling and educating the patient/family/caregiver; independently interpreting result and communicating results to the family/patient/caregiver; and documenting clinical information in the electronic medical record.   Windell Norfolk, MD 03/12/2023, 9:12 AM  [facility] [address], [address] [place], [place] [address] [phone]

## 2023-03-12 NOTE — Patient Instructions (Signed)
Continue current medications Follow-up with PCP Return as needed. 

## 2023-03-13 ENCOUNTER — Encounter: Payer: Self-pay | Admitting: Family Medicine

## 2023-03-16 ENCOUNTER — Other Ambulatory Visit: Payer: Self-pay | Admitting: Family Medicine

## 2023-03-25 ENCOUNTER — Encounter: Payer: Self-pay | Admitting: Family Medicine

## 2023-03-26 ENCOUNTER — Telehealth: Payer: Self-pay | Admitting: Family Medicine

## 2023-03-26 ENCOUNTER — Ambulatory Visit: Payer: Medicare Other

## 2023-03-26 NOTE — Telephone Encounter (Signed)
FMLA forms  Copied Noted Sleeved (put in provider box)  Call patient son Deysy Furmanski (son) [phone] when forms are ready and mail forms to son  [address] [place], [place] [address]

## 2023-04-04 ENCOUNTER — Institutional Professional Consult (permissible substitution): Payer: Medicare Other | Admitting: Professional Counselor

## 2023-04-28 ENCOUNTER — Encounter: Payer: Self-pay | Admitting: Family Medicine

## 2023-04-29 ENCOUNTER — Encounter: Payer: Self-pay | Admitting: Family Medicine

## 2023-04-30 ENCOUNTER — Other Ambulatory Visit: Payer: Self-pay

## 2023-04-30 MED ORDER — SERTRALINE HCL 25 MG PO TABS: 25.0000 mg | ORAL_TABLET | Freq: Every day | ORAL | 0 refills | Status: DC

## 2023-04-30 NOTE — Telephone Encounter (Signed)
Refill sent.

## 2023-05-02 ENCOUNTER — Other Ambulatory Visit: Payer: Self-pay | Admitting: Family Medicine

## 2023-05-02 DIAGNOSIS — R600 Localized edema: Secondary | ICD-10-CM

## 2023-05-04 NOTE — Progress Notes (Unsigned)
[name], female    DOB: January 25, 1946   MRN: [id number]   Brief patient profile:  72 yobf never smoker previously followed by Dr Clyda Hurdle 02/15/16  = 196   Pft report  05/2013  1. Spirometry shows a mild ventilatory defect without definite airflow     obstruction. 2. Lung volumes are mildly to moderately reduced. 3. DLCO is moderately reduced. 4. Airway resistance is slightly elevated. 5. There is improvement with inhaled bronchodilator, but it does not     reach the level of significance. 6. Arterial blood gas is normal.  PFTs 04/22/15 minimal curvature, no better p saba,  ERV  almost 0    History of Present Illness  01/29/2021  Pulmonary/ 1st office eval/ [name] / [place] [facility]  No chief complaint on file. 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 Mb is 100 ft no hills and has to stop at house  Cough: none  Sleep: flat bed and 2 pillows  SABA use: not sure it helps  Overt hb since starting fosamax, unclear how long she's been on it  02 not keeping up with it at all  Covid vax x 4   Rec We will walk you today to make sure you have enough oxygen when you walk We will arrange for [place] 02 level while on 2lpm. Once we're sure we've corrected problem, then we will repeat your echo looking at your heart function.     03/06/2022  f/u ov/[place] office/[name] re: MO/PH/ noct hypoxemia maint on hs 02 2lpm/ no resp meds / no longer coughing   Chief Complaint  Patient presents with   Follow-up    Patient says she is always SOB  ONO was never delivered by dme   Dyspnea:  water aerobics x one hour x 3 x weekly / pushes buggy @[employer]  / needs [employer]c parking - uses husband's car/[employer] sticker  now  Cough: none  Sleeping: flat bed/ 2 pillows  SABA use: none Rec Stay as active as you can and working on weight loss    >>>  ONO  on 2lpm  03/12/22 > no desats > no change rx   05/05/2023  f/u ov/[place] office/[name] re: MO / PH/ noct hypoxemia  maint on just 2lpm hs   Chief Complaint  Patient presents with   Chronic respiratory failure with hypoxia   Pulmonary hypertension   Dyspnea:  uses [employer] sticker/ does [employer] s 02  Cough: none  Sleeping: bed is flat 2 pillows  s resp cc  SABA use: not using  02: 2lpm hs only      No obvious day to day or daytime variability or assoc excess/ purulent sputum or mucus plugs or hemoptysis or cp or chest tightness, subjective wheeze or overt sinus or hb symptoms.    Also denies any obvious fluctuation of symptoms with weather or environmental changes or other aggravating or alleviating factors except as outlined above   No unusual exposure hx or h/o childhood pna/ asthma or knowledge of premature birth.  Current Allergies, Complete Past Medical History, Past Surgical History, Family History, and Social History were reviewed in [employer] record.  ROS  The following are not active complaints unless bolded Hoarseness, sore throat, dysphagia, dental problems, itching, sneezing,  nasal congestion  or discharge of excess mucus or purulent secretions, ear ache,   fever, chills, sweats, intended wt loss classically pleuritic or exertional cp,  orthopnea pnd or arm/hand swelling  or leg swelling, presyncope, palpitations, abdominal pain, anorexia, nausea, vomiting, diarrhea  or change in bowel habits or change in bladder habits, change in stools or change in urine, dysuria, hematuria,  rash, arthralgias, visual complaints, headache, numbness, weakness or ataxia or problems with walking or coordination,  change in mood/ anxious/ insomnia since husband passed or  memory.        Current Meds  Medication Sig   albuterol (VENTOLIN HFA) 108 (90 Base) MCG/ACT inhaler TAKE 2 PUFFS BY MOUTH EVERY 6 HOURS AS NEEDED FOR WHEEZE OR SHORTNESS OF BREATH   amLODipine (NORVASC) 10  MG tablet TAKE 1 TABLET BY MOUTH DAILY   aspirin EC 81 MG tablet Take 81 mg by mouth daily. Swallow whole.   atorvastatin (LIPITOR) 20 MG tablet Take 1 tablet (20 mg total) by mouth daily.   BIOTIN PO Take by mouth.   Cholecalciferol (VITAMIN D3) 125 MCG (5000 UT) CAPS Take 1 capsule (5,000 Units total) by mouth daily.   cloNIDine (CATAPRES) 0.2 MG tablet Take 0.2 mg by mouth daily.   clotrimazole-betamethasone (LOTRISONE) cream Apply cream twice daily to rash in groin for 10 days, then as needed   famotidine (PEPCID) 10 MG tablet Take 10 mg by mouth 2 (two) times daily.   furosemide (LASIX) 40 MG tablet TAKE 1 TABLET BY MOUTH DAILY AS NEEDED FOR EDEMA OR FLUID.   gabapentin (NEURONTIN) 300 MG capsule Take 300 mg by mouth daily.   Hydrocortisone (GERHARDT'S BUTT CREAM) CREA Apply 1 application topically 3 (three) times daily.   losartan-hydrochlorothiazide (HYZAAR) 100-25 MG tablet TAKE 1 TABLET BY MOUTH EVERY DAY   magnesium 30 MG tablet Take one tablet by mouth once daily   Melatonin 3 MG CAPS Take 1 capsule (3 mg total) by mouth at bedtime.   metoprolol succinate (TOPROL-XL) 50 MG 24 hr tablet TAKE 1 TABLET BY MOUTH DAILY WITH OR IMMEDIATELY FOLLOWING A MEAL   olopatadine (PATANOL) 0.1 % ophthalmic solution INSTILL 1 DROP INTO BOTH EYES TWICE A DAY   omeprazole (PRILOSEC) 20 MG capsule Take 1 capsule (20 mg total) by mouth daily.   potassium chloride SA (KLOR-CON M) 20 MEQ tablet Take 1 tablet (20 mEq total) by mouth daily.   pyridOXINE (VITAMIN B-6) 100 MG tablet Take 200 mg by mouth daily.   sertraline (ZOLOFT) 25 MG tablet Take 1 tablet (25 mg total) by mouth daily.               Past Medical History:  Diagnosis Date   Asthma    Back pain    Depression    Diverticulosis of colon    GERD (gastroesophageal reflux disease)    Hyperlipidemia    Hypertension    Obesity    Osteoarthrosis, unspecified whether generalized or localized, lower leg    bilateral knees    Pulmonary  hypertension (HCC)      Objective:    Wts   05/05/2023     178  03/06/2022       193  09/03/2021       197   06/12/21 199 lb (90.3 kg)  05/02/21 197 lb (89.4 kg)  01/29/21 197 lb (89.4 kg)    Vital signs reviewed  05/05/2023  - Note at rest 02 sats  93% on RA   General appearance:  pleasant amb bf nad       HEENT : Oropharynx  clear     Nasal turbinates nl    NECK :  without  apparent JVD/ palpable Nodes/TM    LUNGS: no acc muscle use,  Nl contour chest which is clear to A and P bilaterally without cough on insp or exp maneuvers   CV:  RRR  no s3 or murmur  -- Pos Increase in P2, but  no edema   ABD:  soft and nontender with nl inspiratory excursion in the supine position. No bruits or organomegaly appreciated   MS:  Nl gait/ ext warm without deformities Or obvious joint restrictions  calf tenderness, cyanosis or clubbing    SKIN: warm and dry without lesions    NEURO:  alert, approp, nl sensorium with  no motor or cerebellar deficits apparent.      Assessment

## 2023-05-05 ENCOUNTER — Ambulatory Visit: Payer: Medicare Other | Admitting: Internal Medicine

## 2023-05-05 ENCOUNTER — Encounter: Payer: Self-pay | Admitting: Internal Medicine

## 2023-05-05 VITALS — BP 142/67 | HR 99 | Ht 60.0 in | Wt 178.0 lb

## 2023-05-05 DIAGNOSIS — J9611 Chronic respiratory failure with hypoxia: Secondary | ICD-10-CM

## 2023-05-05 DIAGNOSIS — R058 Other specified cough: Secondary | ICD-10-CM

## 2023-05-05 DIAGNOSIS — I272 Pulmonary hypertension, unspecified: Secondary | ICD-10-CM

## 2023-05-05 NOTE — Assessment & Plan Note (Signed)
Placed on 02 by Dr Juanetta Gosling for noct use with restrictive changes on pfts and very low erv c/w effects of body habitus  -  01/29/2021   Walked RA  approx  400 ft  @ slow to moderate pace  stopped due to  Min sob/ sats 95%      - ONO on 2lpm 01/29/2021  - PFT's   05/08/21   FVC  1.44 ( 78%) s obst and ERV  0.11 actual vol at wt = 199  with DLCO  7.02 (41%) corrects to 4.28 (92%)  for alv volume and FV curve no obstruction   - 06/12/2021   Walked on RA x  3  lap(s) =  approx 44ft  @ slow pace, stopped due to end of study c/o sob p 1st lap  with lowest 02 sats 94  - ONO on 2lpm  03/12/22 > no desats > no change rx  - 05/05/2023   Walked on RA  x  3 lap(s) =  approx 450  ft  @ nl pace, stopped due to end of study  with lowest 02 sats 95%   Adequate control on present rx, reviewed in detail with pt > no change in rx needed    F/u q 12 m

## 2023-05-05 NOTE — Assessment & Plan Note (Addendum)
06/12/2021 try off fosamax with max gerd rx > resolved > continue otc's 05/05/2023    Each maintenance medication was reviewed in detail including emphasizing most importantly the difference between maintenance and prns and under what circumstances the prns are to be triggered using an action plan format where appropriate.  Total time for H and P, chart review, counseling,  directly observing portions of ambulatory 02 saturation study/ and generating customized AVS unique to this office visit / same day charting = 31 min

## 2023-05-05 NOTE — Assessment & Plan Note (Signed)
Echo  01/29/13  Left ventricle: Mild to moderate concentric hypertrophy.    Systolic function was normal. The estimated ejection    fraction was in the range of 55% to 60% with G 1 diastolic dysfunction   - Right ventricle: The cavity size was mildly dilated. RV    systolic pressure: 38mm Hg (S, est).   - Echo 07/17/21:  G 1 diastolic dysfunction/ mild lae/ nl RA with low nl RV fxn, could not do PAS  C/w WHO 3/ rx is adequate 02  Discussed in detail all the  indications, usual  risks and alternatives  relative to the benefits with patient who agrees to proceed with Rx as outlined.

## 2023-05-05 NOTE — Patient Instructions (Addendum)
Omeprazole =  20mg   Take one pill  30-60 min before first meal of the day and Pepcid ac (famotidine) 20 mg (2 x 10 mg)  after supper    No change in medications   Continue 02 2lpm at bedtime and keep up the good work on wt loss    Please schedule a follow up visit in 12  months but call sooner if needed

## 2023-05-07 ENCOUNTER — Other Ambulatory Visit: Payer: Self-pay | Admitting: Family Medicine

## 2023-05-08 ENCOUNTER — Encounter: Payer: Self-pay | Admitting: Family Medicine

## 2023-05-08 ENCOUNTER — Ambulatory Visit (INDEPENDENT_AMBULATORY_CARE_PROVIDER_SITE_OTHER): Payer: Medicare Other | Admitting: Family Medicine

## 2023-05-08 VITALS — BP 118/69 | HR 74 | Resp 16 | Ht 60.0 in | Wt 178.0 lb

## 2023-05-08 DIAGNOSIS — F409 Phobic anxiety disorder, unspecified: Secondary | ICD-10-CM

## 2023-05-08 DIAGNOSIS — F32A Depression, unspecified: Secondary | ICD-10-CM

## 2023-05-08 DIAGNOSIS — I10 Essential (primary) hypertension: Secondary | ICD-10-CM | POA: Diagnosis not present

## 2023-05-08 DIAGNOSIS — F4321 Adjustment disorder with depressed mood: Secondary | ICD-10-CM

## 2023-05-08 DIAGNOSIS — F5105 Insomnia due to other mental disorder: Secondary | ICD-10-CM

## 2023-05-08 DIAGNOSIS — F419 Anxiety disorder, unspecified: Secondary | ICD-10-CM | POA: Diagnosis not present

## 2023-05-08 MED ORDER — TEMAZEPAM 7.5 MG PO CAPS: 7.5000 mg | ORAL_CAPSULE | Freq: Every evening | ORAL | 2 refills | Status: DC | PRN

## 2023-05-08 NOTE — Patient Instructions (Addendum)
F/U in 6 weeks , call if you need me sooner  Need to take sertraline and Restoril for anxiety, depression and sleep  Need to get therapy  We will attempt to transfer scripts to [facility] and will let you know when  done, if able  Thanks for choosing [facility], we consider it a privelige to serve you.

## 2023-05-12 ENCOUNTER — Encounter: Payer: Self-pay | Admitting: Family Medicine

## 2023-05-12 DIAGNOSIS — F4321 Adjustment disorder with depressed mood: Secondary | ICD-10-CM | POA: Insufficient documentation

## 2023-05-12 NOTE — Assessment & Plan Note (Signed)
Adjustment to loss of spouse who she had a difficult/ challenging life with for 50 years since he was an alcoholic, experiencing poor sleep and inc anxiety needs to take  meds prescribed and alsowill reach out to [facility] as still has driving restrictions states she cannot get her meds even Refer to therapist

## 2023-05-12 NOTE — Assessment & Plan Note (Signed)
Refer therapy, spouse of over 50 years was an alcoholic and marriage qwas challenging as a result, however , now experiencing complicated grief, needs and wants therapy

## 2023-05-12 NOTE — Assessment & Plan Note (Signed)
Controlled, no change in medication DASH diet and commitment to daily physical activity for a minimum of 30 minutes discussed and encouraged, as a part of hypertension management. The importance of attaining a healthy weight is also discussed.     05/08/2023   11:11 AM 05/05/2023    9:05 AM 03/26/2023    2:57 PM 03/12/2023    8:19 AM 01/31/2023   10:56 AM 01/31/2023   10:52 AM 12/20/2022    1:09 PM  BP/Weight  Systolic BP 118 142 121  132 167 116  Diastolic BP 69 67 67  78 75 62  Wt. (Lbs) 178 178  176.5  172.08 171.4  BMI 34.76 kg/m2 34.76 kg/m2  34.47 kg/m2  33.61 kg/m2 33.47 kg/m2

## 2023-05-12 NOTE — Progress Notes (Signed)
[name]     MRN: [id number]      DOB: Jun 09, 1946  Chief Complaint  Patient presents with   Hypertension    Follow up visit    Insomnia    Hasn't been able to sleep at night     HPI [name] is here for follow up and re-evaluation of chronic medical conditions, medication management and review of any available recent lab and radiology data.  Preventive health is updated, specifically  Cancer screening and Immunization.   C/o very poor sleep since passing of her spouse, on average 4 to 5 hours broken, difficulty falling and staying asleep since she lost her husband Patient and/or legal guardian verbally consented to [facility] services about presenting concerns and psychiatric consultation as appropriate.  The services will be billed as appropriate for the patient  ROS Denies recent fever or chills. Denies sinus pressure, nasal congestion, ear pain or sore throat. Denies chest congestion, productive cough or wheezing. Denies chest pains, palpitations and leg swelling Denies abdominal pain, nausea, vomiting,diarrhea or constipation.   Denies dysuria, frequency, hesitancy or incontinence. C/o chronic  joint pain, and limitation in mobility. Denies headaches, seizures, numbness, or tingling. C/o depression, anxiety and  insomnia. Denies skin break down or rash.   PE  BP 118/69   Pulse 74   Resp 16   Ht 5' (1.524 m)   Wt 178 lb (80.7 kg)   SpO2 92%   BMI 34.76 kg/m   Patient alert and oriented and in no cardiopulmonary distress.  HEENT: No facial asymmetry, EOMI,     Neck Decreased ROM .  Chest: Clear to auscultation bilaterally.decreased air entry  CVS: S1, S2 no murmurs, no S3.Regular rate.  ABD: Soft non tender.   Ext: No edema  MS: decreased  ROM spine, shoulders, hips and knees.  Skin: Intact, no ulcerations or rash noted.  Psych: Good eye contact,tearful and crying at times. Memory intact  anxious and mildly  depressed  appearing.  CNS: CN 2-12 intact, power,  normal throughout.no focal deficits noted.   Assessment & Plan  Anxiety and depression Adjustment to loss of spouse who she had a difficult/ challenging life with for 50 years since he was an alcoholic, experiencing poor sleep and inc anxiety needs to take  meds prescribed and als[facility] to [facility] as still has driving restrictions states she cannot get her meds even Refer to therapist  Insomnia due to anxiety and fear Sleep hygiene reviewed and written information offered also. Prescription sent for  medication needed.   Essential hypertension Controlled, no change in medication DASH diet and commitment to daily physical activity for a minimum of 30 minutes discussed and encouraged, as a part of hypertension management. The importance of attaining a healthy weight is also discussed.     05/08/2023   11:11 AM 05/05/2023    9:05 AM 03/26/2023    2:57 PM 03/12/2023    8:19 AM 01/31/2023   10:56 AM 01/31/2023   10:52 AM 12/20/2022    1:09 PM  BP/Weight  Systolic BP 118 142 121  132 167 116  Diastolic BP 69 67 67  78 75 62  Wt. (Lbs) 178 178  176.5  172.08 171.4  BMI 34.76 kg/m2 34.76 kg/m2  34.47 kg/m2  33.61 kg/m2 33.47 kg/m2       Complicated grief Refer therapy, spouse of over 50 years was an alcoholic and marriage qwas challenging as a result, however , now  experiencing complicated grief, needs and wants therapy

## 2023-05-12 NOTE — Assessment & Plan Note (Signed)
Sleep hygiene reviewed and written information offered also. Prescription sent for  medication needed.  

## 2023-05-13 ENCOUNTER — Telehealth: Payer: Self-pay | Admitting: *Deleted

## 2023-05-13 ENCOUNTER — Telehealth: Payer: Self-pay | Admitting: Family Medicine

## 2023-05-13 NOTE — Telephone Encounter (Signed)
Son called asked for a call back about his mother medications he has questions call back [phone]

## 2023-05-13 NOTE — Progress Notes (Unsigned)
  Care Coordination  Outreach Note  05/13/2023 Name: MARDENE NEESER MRN: [id number] DOB: 1945-11-30   Care Coordination Outreach Attempts: An unsuccessful telephone outreach was attempted today to offer the patient information about available care coordination services.  Follow Up Plan:  Additional outreach attempts will be made to offer the patient care coordination information and services.   Encounter Outcome:  No Answer  Gwenevere Ghazi  Care Coordination Care Guide  Direct Dial: [phone]

## 2023-05-14 ENCOUNTER — Other Ambulatory Visit: Payer: Self-pay

## 2023-05-14 MED ORDER — CLONAZEPAM 0.5 MG PO TABS: ORAL_TABLET | ORAL | 0 refills | Status: DC

## 2023-05-14 MED ORDER — METOPROLOL SUCCINATE ER 50 MG PO TB24: ORAL_TABLET | ORAL | 2 refills | Status: DC

## 2023-05-14 MED ORDER — SERTRALINE HCL 25 MG PO TABS: 25.0000 mg | ORAL_TABLET | Freq: Every day | ORAL | 3 refills | Status: DC

## 2023-05-14 MED ORDER — MAGNESIUM 30 MG PO TABS: ORAL_TABLET | ORAL | 2 refills | Status: DC

## 2023-05-14 NOTE — Telephone Encounter (Signed)
Magnesium capsule 250 mg daily or magnesium tablet 250 mg daily,  both are otc

## 2023-05-14 NOTE — Progress Notes (Signed)
  Care Coordination   Note   05/14/2023 Name: [name] MRN: [id number] DOB: 1945/10/15  [name] is a 77 y.o. year old female who sees Kerri Perches, MD for primary care. I reached out to [name] by phone today to offer care coordination services.  Ms. Gramley was given information about Care Coordination services today including:   The Care Coordination services include support from the care team which includes your Nurse Coordinator, Clinical Social Worker, or Pharmacist.  The Care Coordination team is here to help remove barriers to the health concerns and goals most important to you. Care Coordination services are voluntary, and the patient may decline or stop services at any time by request to their care team member.   Care Coordination Consent Status: Patient agreed to services and verbal consent obtained.   Follow up plan:  Telephone appointment with care coordination team member scheduled for:  05/22/23  Encounter Outcome:  Patient Scheduled  [facility] Coordination Care Guide  Direct Dial: [phone]

## 2023-05-14 NOTE — Telephone Encounter (Signed)
[facility] calling says they are unsure of a Magnesium 30mg . Please advise [phone] Thank you

## 2023-05-22 ENCOUNTER — Ambulatory Visit: Payer: Self-pay | Admitting: *Deleted

## 2023-05-22 ENCOUNTER — Encounter: Payer: Self-pay | Admitting: *Deleted

## 2023-05-26 NOTE — Patient Instructions (Signed)
Visit Information  Thank you for taking time to visit with me today. Please don't hesitate to contact me if I can be of assistance to you.   Following are the goals we discussed today:   Goals Addressed               This Visit's Progress     Receive Counseling & Supportive Services to Reduce & Manage Symptoms of Anxiety, Depression, Grief & Loss. 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, Decreased Hearing, Complicated Grief, Anxiety & Depression]  General Interventions   General Interventions Discussed/Reviewed General Interventions Discussed, Lipid Profile, Doctor Visits, Durable Medical Equipment (DME), Communication with, Level of Care, Vaccines, Labs, Annual Foot Exam, [employer], Health Screening, Annual Eye Exam, General Interventions Reviewed  [Communication with Care Team Members]  Labs Hgb A1c every 3 months, Kidney Function  [Encouraged]  Vaccines COVID-19, Flu, Pneumonia, RSV, Shingles, Tetanus/Pertussis/Diphtheria  [Encouraged]  Doctor Visits Discussed/Reviewed Doctor Visits Discussed, Doctor Visits Reviewed, Annual Wellness Visits, PCP, Specialist  [Encouraged]  Health Screening Bone Density, Colonoscopy, Mammogram  [Encouraged]  Durable Medical Equipment (DME) BP Cuff, Walker, Wheelchair, Other  [Prescription Glasses, Cane]  Wheelchair Standard  PCP/Specialist Visits Compliance with follow-up visit  [Encouraged]  Communication with PCP/Specialists, Charity fundraiser, Pharmacists, Social Work  [Encouraged]  Level of Care Adult Daycare, Air traffic controller, Assisted Living, Personal Care Services  [Encouraged Consideration]  Applications  Medicaid, Personal Care Services, [place]-2  [Encouraged Consideration]  Exercise Interventions   Exercise Discussed/Reviewed Exercise Discussed, Exercise Reviewed, Physical Activity, Weight Managment, Assistive device use and maintanence  [Encouraged]  Physical Activity Discussed/Reviewed Physical Activity Discussed, Physical Activity Reviewed, Types of exercise, Home Exercise Program (HEP), PREP, Gym  [Encouraged]  Weight Management Weight loss  [Encouraged]  Education Interventions   Education Provided Provided Therapist, sports, Provided Web-based Education, Provided Education  Provided Verbal Education On Nutrition, Exercise, Medication, When to see the doctor, Foot Care, Eye Care, Labs, Blood Sugar Monitoring, Mental Health/Coping with Illness, [employer], [employer], Applications  [Encouraged]  Labs Reviewed Hgb A1c  [Prediabetic]  Applications Medicaid, Personal Care Services, [place]-2  [Encouraged Consideration]  Mental Health Interventions   Mental Health Discussed/Reviewed Mental Health Discussed, Substance Abuse, Suicide, Mental Health Reviewed, Coping Strategies, Crisis, Anxiety, Other, Depression, Grief and Loss  [Confirmed Absence of Domestic Violence]  Nutrition Interventions   Nutrition Discussed/Reviewed Nutrition Discussed, Portion sizes, Decreasing sugar intake, Nutrition Reviewed, Carbohydrate meal planning, Increasing proteins, Decreasing fats, Adding fruits and vegetables, Decreasing salt, Fluid intake, Supplemental nutrition  [Encouraged]  Pharmacy Interventions   Pharmacy Dicussed/Reviewed Pharmacy Topics Discussed, Affording Medications, Referral to Pharmacist, Pharmacy Topics Reviewed, Medications and their functions, Medication Adherence  [Encouraged]  Medication Adherence --  [Confirm Medication Compliance]  Referral to Pharmacist --  Hanley Hays Ability to Afford Medications]  Safety Interventions   Safety Discussed/Reviewed Safety Discussed, Safety Reviewed, Fall  Risk, Home Safety  [Encouraged]  Home Engineer, water, Refer for community resources  [Encouraged]  Advanced Directive Interventions   Advanced Directives Discussed/Reviewed Advanced Directives Discussed  [Advanced Directives in Place]      Assessed Social Determinant of Health Barriers. Discussed Plans for Ongoing Care Management Follow Up. Provided Careers information officer Information for Care Management Team Members.  Screened for Signs & Symptoms of Depression, Related to Chronic Disease State.  PHQ2 & PHQ9 Depression Screen Completed & Results Reviewed.  Suicidal Ideation & Homicidal Ideation Assessed - None Present.   Domestic Violence Assessed - None Present. Access to Weapons Assessed - None Present.   Active Listening & Reflection Utilized.  Verbalization of Feelings Encouraged.  Emotional Support Provided. Feelings of Caregiver Burnout, Stress, & Fatigue Validated. Symptoms of Anxiety & Depression Acknowledged. Caregiver Resources Reviewed. Caregiver Support Groups Provided. Self-Enrollment in Caregiver Support Group of Interest Emphasized, from List Provided. Crisis Support Information, Agencies, Services & Resources Discussed. Problem Solving Interventions Identified. Task-Centered Solutions Implemented.   Solution-Focused Strategies Developed. Acceptance & Commitment Therapy Introduced. Brief Cognitive Behavioral Therapy Initiated. Client-Centered Therapy Enacted. Reviewed Prescription Medications & Discussed Importance of Compliance. CSW Collaboration with Dr. Syliva Overman, Primary Care Provider with [facility] [phone]), Via Secure Chat Message in Epic & Routed Note in Chart, to Request An Increase in Prescription Medication Sertraline (Zoloft) from 25 MG, PO, Daily to 50 MG, PO, Daily, Per Your Request. Confirmed Granddaughter Continues to Reside in The Home with You to Provide Care, Supervision & Assistance with Activities of Daily  Living. Quality of Sleep Assessed & Sleep Hygiene Techniques Promoted. Discussed Higher Level of Care Options (I.e. Assisted Living, Extended Care, Skilled Nursing, Etc.) & Encouraged Consideration. Verified No In-Home Care Services, [employer], Warden/ranger, Etc., Covered Under Firefighter through [employer].   Reviewed Materials engineer through [employer] & Encouraged Completion of Medicaid Application. Verified No Long-Term Care Insurance Benefits, Secondary Insurance Policies, Plans, Coverage, Etc.  Confirmed Patient, Nor Deceased Husband Were Veterans, Making Her Ineligible to Apply for Aid & Attendance Benefits, Through [employer]. Offered Assistance with Completion of Medicaid Application & Encouraged Submission to [facility] of Social Services [phone]), for Processing. Requested Review of The Following List of [employer], [employer], [employer], Mailed on 05/22/2023: ~ Adult Day Care Programs  ~ In-Home Care & Respite Agencies ~ Home Health Care Agencies ~ Respite Care Agencies & Facilities ~ Personal Care Services Application  ~ Theatre manager Providers Encouraged Careers information officer with [employer], Services & Resources of Interest in [place], from List Provided, in An Effort to Obtain 24 Hour Care & Supervision. Encouraged Completion of Application for Personal Care Services, through [facility] [phone]), Once Approved for Medicaid, through [facility] of Social Services [phone]). Encouraged Contact with CSW (# [phone]), if You Have Questions, Need Assistance, or If Additional Social Work Needs are Identified Between Now & Our Next Follow-Up Outreach Call, Scheduled on 06/06/2023 at 12:45 PM.      Our next appointment is by telephone on 06/06/2023 at 12:45  pm.  Please call the care guide team at [phone] if you need to cancel or reschedule your appointment.   If you are experiencing a Mental Health or Behavioral Health Crisis or need someone to talk to, please call the Suicide and Crisis Lifeline: 988 call the [place] National Suicide Prevention Lifeline: (626)531-7949 or TTY: 574-377-2591 TTY (209)209-6449) to talk to a trained counselor call 1-800-273-TALK (toll free, 24 hour hotline) go to [facility] [address] [place] [phone]) call the [facility] Crisis Line: [phone] call 911  Patient verbalizes understanding of instructions and care plan provided today and agrees to view in MyChart. Active MyChart status and patient understanding of  how to access instructions and care plan via MyChart confirmed with patient.     Telephone follow up appointment with care management team member scheduled for:  06/06/2023 at 12:45 pm.  Danford Bad, BSW, MSW, LCSW  Embedded Practice Social Work Case Manager  [facility], Population Health Direct Dial: [phone]  Fax: [phone] Email: [email or web address] Website: [email or web address]

## 2023-05-26 NOTE — Patient Outreach (Signed)
Care Coordination   Initial Visit Note   05/26/2023  Name: [name] MRN: [id number] DOB: 1946/05/05  [name] is a 77 y.o. year old female who sees Kerri Perches, MD for primary care. I spoke with [name] by phone today.  What matters to the patients health and wellness today?  Receive Counseling & Supportive Services to Reduce & Manage Symptoms of Anxiety, Depression, Grief & Loss.   Goals Addressed               This Visit's Progress     Receive Counseling & Supportive Services to Reduce & Manage Symptoms of Anxiety, Depression, Grief & Loss. 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, Decreased Hearing, Complicated Grief, Anxiety & Depression]  General Interventions   General Interventions Discussed/Reviewed General Interventions Discussed, Lipid Profile, Doctor Visits, Durable Medical Equipment (DME), Communication with, Level of Care, Vaccines, Labs, Annual Foot Exam, [employer], Health Screening, Annual Eye Exam, General Interventions Reviewed  [Communication with Care Team Members]  Labs Hgb A1c every 3 months, Kidney Function  [Encouraged]  Vaccines COVID-19, Flu, Pneumonia, RSV, Shingles, Tetanus/Pertussis/Diphtheria  [Encouraged]  Doctor Visits Discussed/Reviewed Doctor Visits Discussed, Doctor Visits Reviewed, Annual Wellness Visits, PCP, Specialist  [Encouraged]  Health Screening Bone Density, Colonoscopy, Mammogram  [Encouraged]  Durable Medical Equipment (DME) BP Cuff, Walker, Wheelchair, Other  [Prescription Glasses, Cane]  Wheelchair Standard  PCP/Specialist Visits Compliance with follow-up  visit  [Encouraged]  Communication with PCP/Specialists, Charity fundraiser, Pharmacists, Social Work  [Encouraged]  Level of Care Adult Daycare, Air traffic controller, Assisted Living, Personal Care Services  [Encouraged Consideration]  Applications Medicaid, Personal Care Services, [place]  [Encouraged Consideration]  Exercise Interventions   Exercise Discussed/Reviewed Exercise Discussed, Exercise Reviewed, Physical Activity, Weight Managment, Assistive device use and maintanence  [Encouraged]  Physical Activity Discussed/Reviewed Physical Activity Discussed, Physical Activity Reviewed, Types of exercise, Home Exercise Program (HEP), PREP, Gym  [Encouraged]  Weight Management Weight loss  [Encouraged]  Education Interventions   Education Provided Provided Therapist, sports, Provided Web-based Education, Provided Education  Provided Verbal Education On Nutrition, Exercise, Medication, When to see the doctor, Foot Care, Eye Care, Labs, Blood Sugar Monitoring, Mental Health/Coping with Illness, [employer], [employer], Applications  [Encouraged]  Labs Reviewed Hgb A1c  [Prediabetic]  Applications Medicaid, Personal Care Services, [place]  [Encouraged Consideration]  Mental Health Interventions   Mental Health Discussed/Reviewed Mental Health Discussed, Substance Abuse, Suicide, Mental Health Reviewed, Coping Strategies, Crisis, Anxiety, Other, Depression, Grief and Loss  [Confirmed Absence of Domestic Violence]  Nutrition Interventions   Nutrition Discussed/Reviewed Nutrition Discussed, Portion sizes, Decreasing sugar intake, Nutrition Reviewed, Carbohydrate meal planning, Increasing proteins, Decreasing fats, Adding fruits and vegetables, Decreasing salt, Fluid intake, Supplemental nutrition  [Encouraged]  Pharmacy Interventions   Pharmacy Dicussed/Reviewed Pharmacy Topics Discussed, Affording Medications, Referral to Pharmacist, Pharmacy Topics Reviewed, Medications and their functions, Medication Adherence   [Encouraged]  Medication Adherence --  [Confirm Medication Compliance]  Referral to Pharmacist --  Hanley Hays Ability to Afford Medications]  Safety Interventions   Safety Discussed/Reviewed Safety Discussed, Safety Reviewed, Fall Risk, Home Safety  [Encouraged]  Home Engineer, water, Refer for community resources  [Encouraged]  Advanced Directive Interventions  Advanced Directives Discussed/Reviewed Advanced Directives Discussed  [Advanced Directives in Place]      Assessed Social Determinant of Health Barriers. Discussed Plans for Ongoing Care Management Follow Up. Provided Careers information officer Information for Care Management Team Members. Screened for Signs & Symptoms of Depression, Related to Chronic Disease State.  PHQ2 & PHQ9 Depression Screen Completed & Results Reviewed.  Suicidal Ideation & Homicidal Ideation Assessed - None Present.   Domestic Violence Assessed - None Present. Access to Weapons Assessed - None Present.   Active Listening & Reflection Utilized.  Verbalization of Feelings Encouraged.  Emotional Support Provided. Feelings of Caregiver Burnout, Stress, & Fatigue Validated. Symptoms of Anxiety & Depression Acknowledged. Caregiver Resources Reviewed. Caregiver Support Groups Provided. Self-Enrollment in Caregiver Support Group of Interest Emphasized, from List Provided. Crisis Support Information, Agencies, Services & Resources Discussed. Problem Solving Interventions Identified. Task-Centered Solutions Implemented.   Solution-Focused Strategies Developed. Acceptance & Commitment Therapy Introduced. Brief Cognitive Behavioral Therapy Initiated. Client-Centered Therapy Enacted. Reviewed Prescription Medications & Discussed Importance of Compliance. CSW Collaboration with Dr. Syliva Overman, Primary Care Provider with [facility] [phone]), Via Secure Chat Message in Epic & Routed Note in Chart, to Request An Increase in  Prescription Medication Sertraline (Zoloft) from 25 MG, PO, Daily to 50 MG, PO, Daily, Per Your Request. Confirmed Granddaughter Continues to Reside in The Home with You to Provide Care, Supervision & Assistance with Activities of Daily Living. Quality of Sleep Assessed & Sleep Hygiene Techniques Promoted. Discussed Higher Level of Care Options (I.e. Assisted Living, Extended Care, Skilled Nursing, Etc.) & Encouraged Consideration. Verified No In-Home Care Services, [employer], Warden/ranger, Etc., Covered Under Firefighter through [employer].   Reviewed Materials engineer through [employer] & Encouraged Completion of Medicaid Application. Verified No Long-Term Care Insurance Benefits, Secondary Insurance Policies, Plans, Coverage, Etc.  Confirmed Patient, Nor Deceased Husband Were Veterans, Making Her Ineligible to Apply for Aid & Attendance Benefits, Through [employer]. Offered Assistance with Completion of Medicaid Application & Encouraged Submission to [facility] of Social Services [phone]), for Processing. Requested Review of The Following List of [employer], [employer], [employer], Mailed on 05/22/2023: ~ Adult Day Care Programs  ~ In-Home Care & Respite Agencies ~ Home Health Care Agencies ~ Respite Care Agencies & Facilities ~ Personal Care Services Application  ~ Theatre manager Providers Encouraged Careers information officer with [employer], Services & Resources of Interest in [place], from List Provided, in An Effort to Obtain 24 Hour Care & Supervision. Encouraged Completion of Application for Personal Care Services, through [facility] [phone]), Once Approved for Medicaid, through The [facility] of Social Services [phone]). Encouraged Contact with CSW (# [phone]), if You Have  Questions, Need Assistance, or If Additional Social Work Needs are Identified Between Now & Our Next Follow-Up Outreach Call, Scheduled on 06/06/2023 at 12:45 PM.        SDOH assessments and interventions completed:  Yes.  SDOH Interventions Today    Flowsheet Row Most Recent Value  SDOH Interventions   Food Insecurity Interventions Intervention Not Indicated  Housing Interventions Intervention Not Indicated  Transportation Interventions Intervention Not Indicated, Patient Resources (Friends/Family), Payor Benefit  Utilities Interventions Intervention Not Indicated  Alcohol Usage Interventions Intervention Not Indicated (Score <7)  Depression Interventions/Treatment  Referral to Psychiatry, Medication, Counseling  Financial Strain Interventions Intervention Not Indicated  Physical Activity Interventions Intervention Not Indicated  Stress  Interventions Intervention Not Indicated  Social Connections Interventions Intervention Not Indicated  Health Literacy Interventions Intervention Not Indicated     Care Coordination Interventions:  Yes, provided.   Follow up plan: Follow up call scheduled for 06/06/2023 at 12:45 pm.  Encounter Outcome:  Patient Visit Completed.   Danford Bad, BSW, MSW, Printmaker Social Work Case Set designer Health  [facility], Population Health Direct Dial: [phone]  Fax: [phone] Email: [email or web address]: [email or web address]

## 2023-05-27 ENCOUNTER — Other Ambulatory Visit: Payer: Self-pay

## 2023-05-27 ENCOUNTER — Other Ambulatory Visit: Payer: Self-pay | Admitting: Family Medicine

## 2023-05-27 ENCOUNTER — Telehealth: Payer: Self-pay | Admitting: Family Medicine

## 2023-05-27 DIAGNOSIS — R600 Localized edema: Secondary | ICD-10-CM

## 2023-05-27 MED ORDER — AMLODIPINE BESYLATE 10 MG PO TABS: 10.0000 mg | ORAL_TABLET | Freq: Every day | ORAL | 1 refills | Status: DC

## 2023-05-27 MED ORDER — FUROSEMIDE 40 MG PO TABS: 40.0000 mg | ORAL_TABLET | Freq: Every day | ORAL | 1 refills | Status: DC

## 2023-05-27 NOTE — Telephone Encounter (Signed)
Sent updated list to [place] after speaking with Shawna Orleans

## 2023-05-27 NOTE — Telephone Encounter (Signed)
Melany called from [facility] pharmacy needs to speak to nurse to discuss patient medications with the nurse. Call back # [phone].

## 2023-05-29 ENCOUNTER — Other Ambulatory Visit: Payer: Self-pay

## 2023-05-29 DIAGNOSIS — R600 Localized edema: Secondary | ICD-10-CM

## 2023-05-29 MED ORDER — ATORVASTATIN CALCIUM 20 MG PO TABS: 20.0000 mg | ORAL_TABLET | Freq: Every day | ORAL | 3 refills | Status: AC

## 2023-05-29 MED ORDER — SERTRALINE HCL 25 MG PO TABS: 25.0000 mg | ORAL_TABLET | Freq: Every day | ORAL | 3 refills | Status: DC

## 2023-05-29 MED ORDER — MELATONIN 3 MG PO CAPS: 1.0000 | ORAL_CAPSULE | Freq: Every day | ORAL | 3 refills | Status: AC

## 2023-05-29 MED ORDER — LOSARTAN POTASSIUM-HCTZ 100-25 MG PO TABS: 1.0000 | ORAL_TABLET | Freq: Every day | ORAL | 5 refills | Status: DC

## 2023-05-29 MED ORDER — AMLODIPINE BESYLATE 10 MG PO TABS: 10.0000 mg | ORAL_TABLET | Freq: Every day | ORAL | 1 refills | Status: DC

## 2023-05-29 MED ORDER — MAGNESIUM 30 MG PO TABS: ORAL_TABLET | ORAL | 2 refills | Status: AC

## 2023-05-29 MED ORDER — FUROSEMIDE 40 MG PO TABS: 40.0000 mg | ORAL_TABLET | Freq: Every day | ORAL | 1 refills | Status: DC

## 2023-05-29 MED ORDER — CLOTRIMAZOLE-BETAMETHASONE 1-0.05 % EX CREA: TOPICAL_CREAM | CUTANEOUS | 1 refills | Status: DC

## 2023-05-29 MED ORDER — OLOPATADINE HCL 0.1 % OP SOLN: 1.0000 [drp] | Freq: Two times a day (BID) | OPHTHALMIC | 6 refills | Status: AC

## 2023-05-29 MED ORDER — POTASSIUM CHLORIDE CRYS ER 20 MEQ PO TBCR: 20.0000 meq | EXTENDED_RELEASE_TABLET | Freq: Every day | ORAL | 2 refills | Status: DC

## 2023-05-29 MED ORDER — METOPROLOL SUCCINATE ER 50 MG PO TB24: ORAL_TABLET | ORAL | 2 refills | Status: AC

## 2023-05-29 MED ORDER — VITAMIN D3 125 MCG (5000 UT) PO CAPS: 5000.0000 [IU] | ORAL_CAPSULE | Freq: Every day | ORAL | 11 refills | Status: AC

## 2023-05-29 MED ORDER — OMEPRAZOLE 20 MG PO CPDR: 20.0000 mg | DELAYED_RELEASE_CAPSULE | Freq: Every day | ORAL | 2 refills | Status: AC

## 2023-05-29 MED ORDER — ALBUTEROL SULFATE HFA 108 (90 BASE) MCG/ACT IN AERS: 2.0000 | INHALATION_SPRAY | Freq: Four times a day (QID) | RESPIRATORY_TRACT | 1 refills | Status: DC | PRN

## 2023-06-03 MED ORDER — TEMAZEPAM 7.5 MG PO CAPS: 7.5000 mg | ORAL_CAPSULE | Freq: Every evening | ORAL | 5 refills | Status: DC | PRN

## 2023-06-04 ENCOUNTER — Encounter: Payer: Self-pay | Admitting: Family Medicine

## 2023-06-04 ENCOUNTER — Other Ambulatory Visit: Payer: Self-pay | Admitting: Family Medicine

## 2023-06-04 DIAGNOSIS — R600 Localized edema: Secondary | ICD-10-CM

## 2023-06-05 ENCOUNTER — Other Ambulatory Visit (HOSPITAL_COMMUNITY): Payer: Self-pay | Admitting: Family Medicine

## 2023-06-05 DIAGNOSIS — Z1231 Encounter for screening mammogram for malignant neoplasm of breast: Secondary | ICD-10-CM

## 2023-06-06 ENCOUNTER — Ambulatory Visit: Payer: Self-pay | Admitting: *Deleted

## 2023-06-06 NOTE — Patient Instructions (Signed)
Visit Information  Thank you for taking time to visit with me today. Please don't hesitate to contact me if I can be of assistance to you.   Following are the goals we discussed today:   Goals Addressed               This Visit's Progress     Receive Counseling & Supportive Services to Reduce & Manage Symptoms of Anxiety, Depression, Grief & Loss. 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, Decreased Hearing, Complicated Grief, Anxiety & Depression]  General Interventions   General Interventions Discussed/Reviewed General Interventions Discussed, Lipid Profile, Doctor Visits, Durable Medical Equipment (DME), Communication with, Level of Care, Vaccines, Labs, Annual Foot Exam, [employer], Health Screening, Annual Eye Exam, General Interventions Reviewed  [Communication with Care Team Members]  Labs Hgb A1c every 3 months, Kidney Function  [Encouraged]  Vaccines COVID-19, Flu, Pneumonia, RSV, Shingles, Tetanus/Pertussis/Diphtheria  [Encouraged]  Doctor Visits Discussed/Reviewed Doctor Visits Discussed, Doctor Visits Reviewed, Annual Wellness Visits, PCP, Specialist  [Encouraged]  Health Screening Bone Density, Colonoscopy, Mammogram  [Encouraged]  Durable Medical Equipment (DME) BP Cuff, Walker, Wheelchair, Other  [Prescription Glasses, Cane]  Wheelchair Standard  PCP/Specialist Visits Compliance with follow-up visit  [Encouraged]  Communication with PCP/Specialists, Charity fundraiser, Pharmacists, Social Work  [Encouraged]  Level of Care Adult Daycare, Air traffic controller, Assisted Living, Personal Care Services  [Encouraged Consideration]  Applications  Medicaid, Personal Care Services, [place]-2  [Encouraged Consideration]  Exercise Interventions   Exercise Discussed/Reviewed Exercise Discussed, Exercise Reviewed, Physical Activity, Weight Managment, Assistive device use and maintanence  [Encouraged]  Physical Activity Discussed/Reviewed Physical Activity Discussed, Physical Activity Reviewed, Types of exercise, Home Exercise Program (HEP), PREP, Gym  [Encouraged]  Weight Management Weight loss  [Encouraged]  Education Interventions   Education Provided Provided Therapist, sports, Provided Web-based Education, Provided Education  Provided Verbal Education On Nutrition, Exercise, Medication, When to see the doctor, Foot Care, Eye Care, Labs, Blood Sugar Monitoring, Mental Health/Coping with Illness, [employer], [employer], Applications  [Encouraged]  Labs Reviewed Hgb A1c  [Prediabetic]  Applications Medicaid, Personal Care Services, [place]-2  [Encouraged Consideration]  Mental Health Interventions   Mental Health Discussed/Reviewed Mental Health Discussed, Substance Abuse, Suicide, Mental Health Reviewed, Coping Strategies, Crisis, Anxiety, Other, Depression, Grief and Loss  [Confirmed Absence of Domestic Violence]  Nutrition Interventions   Nutrition Discussed/Reviewed Nutrition Discussed, Portion sizes, Decreasing sugar intake, Nutrition Reviewed, Carbohydrate meal planning, Increasing proteins, Decreasing fats, Adding fruits and vegetables, Decreasing salt, Fluid intake, Supplemental nutrition  [Encouraged]  Pharmacy Interventions   Pharmacy Dicussed/Reviewed Pharmacy Topics Discussed, Affording Medications, Referral to Pharmacist, Pharmacy Topics Reviewed, Medications and their functions, Medication Adherence  [Encouraged]  Medication Adherence --  [Confirm Medication Compliance]  Referral to Pharmacist --  Hanley Hays Ability to Afford Medications]  Safety Interventions   Safety Discussed/Reviewed Safety Discussed, Safety Reviewed, Fall  Risk, Home Safety  [Encouraged]  Home Engineer, water, Refer for community resources  [Encouraged]  Advanced Directive Interventions   Advanced Directives Discussed/Reviewed Advanced Directives Discussed  [Advanced Directives in Place]      Active Listening & Reflection Utilized.  Verbalization of Feelings Encouraged.  Emotional Support Provided. Problem Solving Interventions Activated. Task-Centered Solutions Employed.  Solution-Focused Strategies Implemented. Acceptance & Commitment Therapy Initiated. Cognitive Behavioral Therapy Performed. Client-Centered Therapy Conducted. Confirmed Disinterest in Pursuing Higher Level of Care Placement Options (I.e. Assisted Living, Extended Care, Skilled Nursing, Etc.). Confirmed Receipt, Thoroughly Reviewed & Encouraged Engagement with [employer], Nurse, adult, Wellsite geologist of Interest in [place], from List Provided: ~ Adult Day Care Programs  ~ In-Home Care & Respite Agencies ~ Home Health Care Agencies ~ Respite Care Agencies & Facilities ~ Merchant navy officer  ~ Theatre manager Providers Confirmed Disinterest in Initiating Medicaid Application, After Offering Assistance with Application Completion & Submission to [facility] Department of Social Services [phone]) for Processing. Confirmed Disinterest in Initiating Personal Care Services Application, After Offering Assistance with Application Completion & Submission to [employer] [facility] [phone]) for Processing.   Encouraged Contact with Danford Bad, Licensed Clinical Social Worker with [facility] [phone]), if You Have Questions, Need Assistance, or If Additional Social Work Needs are Identified Between Now & Our Next Follow-Up Outreach Call, Scheduled on 06/20/2023 at 11:15 AM.      Our next appointment is by telephone on 06/20/2023 at 11:15 am.  Please call the  care guide team at [phone] if you need to cancel or reschedule your appointment.   If you are experiencing a Mental Health or Behavioral Health Crisis or need someone to talk to, please call the Suicide and Crisis Lifeline: 988 call the [place] National Suicide Prevention Lifeline: (346)104-5076 or TTY: 250-710-2342 TTY 3472110766) to talk to a trained counselor call 1-800-273-TALK (toll free, 24 hour hotline) go to [facility] [address], [place] [phone]) call the [facility] Crisis Line: [phone] call 911  Patient verbalizes understanding of instructions and care plan provided today and agrees to view in MyChart. Active MyChart status and patient understanding of how to access instructions and care plan via MyChart confirmed with patient.     Telephone follow up appointment with care management team member scheduled for:  06/20/2023 at 11:15 am.  Danford Bad, BSW, MSW, LCSW  Embedded Practice Social Work Case Manager  [facility], Population Health Direct Dial: [phone]  Fax: [phone] Email: [email or web address] Website: [email or web address]

## 2023-06-06 NOTE — Patient Outreach (Signed)
Care Coordination   Follow Up Visit Note   06/06/2023  Name: [name] MRN: [id number] DOB: 05/06/1946  [name] is a 77 y.o. year old female who sees Mallory Perches, MD for primary care. I spoke with [name] by phone today.  What matters to the patients health and wellness today?  Receive Counseling & Supportive Services to Reduce & Manage Symptoms of Anxiety, Depression, Grief & Loss.    Goals Addressed               This Visit's Progress     Receive Counseling & Supportive Services to Reduce & Manage Symptoms of Anxiety, Depression, Grief & Loss. 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, Decreased Hearing, Complicated Grief, Anxiety & Depression]  General Interventions   General Interventions Discussed/Reviewed General Interventions Discussed, Lipid Profile, Doctor Visits, Durable Medical Equipment (DME), Communication with, Level of Care, Vaccines, Labs, Annual Foot Exam, [employer], Health Screening, Annual Eye Exam, General Interventions Reviewed  [Communication with Care Team Members]  Labs Hgb A1c every 3 months, Kidney Function  [Encouraged]  Vaccines COVID-19, Flu, Pneumonia, RSV, Shingles, Tetanus/Pertussis/Diphtheria  [Encouraged]  Doctor Visits Discussed/Reviewed Doctor Visits Discussed, Doctor Visits Reviewed, Annual Wellness Visits, PCP, Specialist  [Encouraged]  Health Screening Bone Density, Colonoscopy, Mammogram  [Encouraged]  Durable Medical Equipment (DME) BP Cuff, Walker, Wheelchair, Other  [Prescription Glasses, Cane]  Wheelchair Standard  PCP/Specialist Visits Compliance with  follow-up visit  [Encouraged]  Communication with PCP/Specialists, Charity fundraiser, Pharmacists, Social Work  [Encouraged]  Level of Care Adult Daycare, Air traffic controller, Assisted Living, Personal Care Services  [Encouraged Consideration]  Applications Medicaid, Personal Care Services, [place]-2  [Encouraged Consideration]  Exercise Interventions   Exercise Discussed/Reviewed Exercise Discussed, Exercise Reviewed, Physical Activity, Weight Managment, Assistive device use and maintanence  [Encouraged]  Physical Activity Discussed/Reviewed Physical Activity Discussed, Physical Activity Reviewed, Types of exercise, Home Exercise Program (HEP), PREP, Gym  [Encouraged]  Weight Management Weight loss  [Encouraged]  Education Interventions   Education Provided Provided Therapist, sports, Provided Web-based Education, Provided Education  Provided Verbal Education On Nutrition, Exercise, Medication, When to see the doctor, Foot Care, Eye Care, Labs, Blood Sugar Monitoring, Mental Health/Coping with Illness, [employer], [employer], Applications  [Encouraged]  Labs Reviewed Hgb A1c  [Prediabetic]  Applications Medicaid, Personal Care Services, FL-2  [Encouraged Consideration]  Mental Health Interventions   Mental Health Discussed/Reviewed Mental Health Discussed, Substance Abuse, Suicide, Mental Health Reviewed, Coping Strategies, Crisis, Anxiety, Other, Depression, Grief and Loss  [Confirmed Absence of Domestic Violence]  Nutrition Interventions   Nutrition Discussed/Reviewed Nutrition Discussed, Portion sizes, Decreasing sugar intake, Nutrition Reviewed, Carbohydrate meal planning, Increasing proteins, Decreasing fats, Adding fruits and vegetables, Decreasing salt, Fluid intake, Supplemental nutrition  [Encouraged]  Pharmacy Interventions   Pharmacy Dicussed/Reviewed Pharmacy Topics Discussed, Affording Medications, Referral to Pharmacist, Pharmacy Topics Reviewed, Medications and their functions, Medication  Adherence  [Encouraged]  Medication Adherence --  [Confirm Medication Compliance]  Referral to Pharmacist --  [name] Ability to Afford Medications]  Safety Interventions   Safety Discussed/Reviewed Safety Discussed, Safety Reviewed, Fall Risk, Home Safety  [Encouraged]  Home Engineer, water, Refer for community resources  [Encouraged]  Advanced Directive Interventions  Advanced Directives Discussed/Reviewed Advanced Directives Discussed  [Advanced Directives in Place]      Active Listening & Reflection Utilized.  Verbalization of Feelings Encouraged.  Emotional Support Provided. Problem Solving Interventions Activated. Task-Centered Solutions Employed.   Solution-Focused Strategies Implemented. Acceptance & Commitment Therapy Initiated. Cognitive Behavioral Therapy Performed. Client-Centered Therapy Conducted. Confirmed Disinterest in Pursuing Higher Level of Care Placement Options (I.e. Assisted Living, Extended Care, Skilled Nursing, Etc.). Confirmed Receipt, Thoroughly Reviewed & Encouraged Engagement with [employer], Nurse, adult, Wellsite geologist of Interest in [place], from List Provided: ~ Adult Day Care Programs  ~ In-Home Care & Respite Agencies ~ Home Health Care Agencies ~ Respite Care Agencies & Facilities ~ Merchant navy officer  ~ Theatre manager Providers Confirmed Disinterest in Initiating Medicaid Application, After Offering Assistance with Application Completion & Submission to [facility] Department of Social Services [phone]) for Processing. Confirmed Disinterest in Initiating Personal Care Services Application, After Offering Assistance with Application Completion & Submission to [employer] [facility] [phone]) for Processing.   Encouraged Contact with [name], Licensed Clinical Social Worker with [facility] [phone]), if You Have Questions,  Need Assistance, or If Additional Social Work Needs are Identified Between Now & Our Next Follow-Up Outreach Call, Scheduled on 06/20/2023 at 11:15 AM.      SDOH assessments and interventions completed:  Yes.  Care Coordination Interventions:  Yes, provided.   Follow up plan: Follow up call scheduled for 06/20/2023 at 11:15 am.  Encounter Outcome:  Patient Visit Completed.   [name], BSW, MSW, Printmaker Social Work Case Set designer Health  [facility], Population Health Direct Dial: [phone]  Fax: [phone] Email: [email or web address]: [email or web address]

## 2023-06-09 NOTE — Telephone Encounter (Signed)
Printed letter put in providers box for signature

## 2023-06-20 ENCOUNTER — Ambulatory Visit: Payer: Self-pay | Admitting: *Deleted

## 2023-06-20 NOTE — Patient Outreach (Addendum)
Care Coordination   Follow Up Visit Note   06/20/2023  Name: [name] MRN: [id number] DOB: [birth year]  [name] is a 77 y.o. year old female who sees Mallory Perches, MD for primary care. I spoke with [name] and son, [name], [name]. by phone today.  What matters to the patients health and wellness today?  Obtain Assistance in The Home.    Goals Addressed               This Visit's Progress     Obtain Assistance in The Home. 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, Decreased Hearing, Complicated Grief, Anxiety & Depression]  General Interventions   General Interventions Discussed/Reviewed General Interventions Discussed, General Interventions Reviewed, Durable Medical Equipment (DME), Community Resources, Level of Care, Communication with, Doctor Visits  [Communication with Care Team Members]  Doctor Visits Discussed/Reviewed Doctor Visits Discussed, Doctor Visits Reviewed, Specialist, Annual Wellness Visits, PCP  [Encouraged Routine Engagement]  Horticulturist, commercial (DME) BP Cuff, Environmental consultant, Wheelchair, Other  [Prescription Eyeglasses, Medical laboratory scientific officer, Hand-Held Shower [place], Scales & Grab Bars in Shower]  Wheelchair Standard  PCP/Specialist Visits Compliance with follow-up visit  [Encouraged Routine Engagement]  Communication with PCP/Specialists, Charity fundraiser, Pharmacists, Social Work  [Encouraged Routine Engagement]  Level of Care --  [Confirmed Disinterest in Enrollment in Adult Day Care Program or Applying for [employer] or Personal Care Services]  Applications --  [Confirmed Disinterest in Pursuing Higher Level of Care  Placement Options]  Education Interventions   Education Provided Provided Education  Provided Verbal Education On Mental Health/Coping with Illness, When to see the doctor, Air traffic controller, [employer], Tree surgeon --  [Confirmed Disinterest in Pursuing Higher Level of Care Placement Options]  Mental Health Interventions   Mental Health Discussed/Reviewed Mental Health Discussed, Anxiety, Depression, Grief and Loss, Mental Health Reviewed, Substance Abuse, Coping Strategies, Suicide, Crisis  [Assessed Mental Health Status]  Safety Interventions   Safety Discussed/Reviewed Safety Discussed, Safety Reviewed, Fall Risk, Home Safety  [Encouraged Consideration of Home Safety Evaluation]  Home Safety Assistive Devices  [Encouraged Routine Use of Assistive Devices]  Advanced Directive Interventions   Advanced Directives Discussed/Reviewed Advanced Directives Discussed  [Advanced Directives Initiated & Copy Requested to Scan into EMR]       Active Listening & Reflection Utilized.  Verbalization of Feelings Encouraged.  Emotional Support Provided. Acceptance & Commitment Therapy Performed. Cognitive Behavioral Therapy Initiated. Client-Centered Therapy Conducted. Confirmed Interest in Having [facility] [phone]) Begin Pre-Packaging All Prescription Medications Beginning July 23, 2023.   Encouraged Engagement with [employer], Wellsite geologist of Interest in [place], from List Provided, Notifying CSW if Assistance is Needed with Referral Process: ~ Adult Day Care Programs  ~ [employer] Care & Respite Agencies ~ Home Health Care Agencies ~ Respite Care Agencies & Facilities ~ Personal Care Services Application  ~ Personal Care Services Agency Providers Encouraged Routine Engagement with Danford Bad, Licensed Clinical Social Worker with [facility] [phone]), if You Have Questions, Need Assistance, or If Additional  Social Work Needs are Identified Between Now & Our Next Follow-Up Outreach Call, Scheduled on 07/14/2023 at 9:45 AM.      SDOH  assessments and interventions completed:  Yes.  Care Coordination Interventions:  Yes, provided.   Follow up plan: Follow up call scheduled for 07/14/2023 at 9:45 am.  Encounter Outcome:  Patient Visit Completed.   Danford Bad, BSW, MSW, Printmaker Social Work Case Set designer Health  [facility], Population Health Direct Dial: [phone]  Fax: [phone] Email: [email or web address]: [email or web address]

## 2023-06-20 NOTE — Patient Instructions (Addendum)
Visit Information  Thank you for taking time to visit with me today. Please don't hesitate to contact me if I can be of assistance to you.   Following are the goals we discussed today:   Goals Addressed               This Visit's Progress     Obtain Assistance in The Home. 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, Decreased Hearing, Complicated Grief, Anxiety & Depression]  General Interventions   General Interventions Discussed/Reviewed General Interventions Discussed, General Interventions Reviewed, Durable Medical Equipment (DME), Community Resources, Level of Care, Communication with, Doctor Visits  [Communication with Care Team Members]  Doctor Visits Discussed/Reviewed Doctor Visits Discussed, Doctor Visits Reviewed, Specialist, Annual Wellness Visits, PCP  [Encouraged Routine Engagement]  Horticulturist, commercial (DME) BP Cuff, Environmental consultant, Wheelchair, Other  [Prescription Eyeglasses, Medical laboratory scientific officer, Hand-Held Shower [place], Scales & Grab Bars in Shower]  Wheelchair Standard  PCP/Specialist Visits Compliance with follow-up visit  [Encouraged Routine Engagement]  Communication with PCP/Specialists, Charity fundraiser, Pharmacists, Social Work  [Encouraged Routine Engagement]  Level of Care --  [Confirmed Disinterest in Enrollment in Adult Day Care Program or Applying for [employer] or Personal Care Services]  Applications --  [Confirmed Disinterest in Pursuing Higher Level of Care Placement Options]  Education Interventions   Education Provided Provided Education  Provided Verbal Education On Mental Health/Coping with Illness, When to see the doctor, Air traffic controller,  [employer], Tree surgeon --  [Confirmed Disinterest in Pursuing Higher Level of Care Placement Options]  Mental Health Interventions   Mental Health Discussed/Reviewed Mental Health Discussed, Anxiety, Depression, Grief and Loss, Mental Health Reviewed, Substance Abuse, Coping Strategies, Suicide, Crisis  [Assessed Mental Health Status]  Safety Interventions   Safety Discussed/Reviewed Safety Discussed, Safety Reviewed, Fall Risk, Home Safety  [Encouraged Consideration of Home Safety Evaluation]  Home Safety Assistive Devices  [Encouraged Routine Use of Assistive Devices]  Advanced Directive Interventions   Advanced Directives Discussed/Reviewed Advanced Directives Discussed  [Advanced Directives Initiated & Copy Requested to Scan into EMR]       Active Listening & Reflection Utilized.  Verbalization of Feelings Encouraged.  Emotional Support Provided. Acceptance & Commitment Therapy Performed. Cognitive Behavioral Therapy Initiated. Client-Centered Therapy Conducted. Confirmed Interest in Having [facility] [phone]) Begin Pre-Packaging All Prescription Medications Beginning July 23, 2023.   Encouraged Engagement with [employer], Wellsite geologist of Interest in [place], from List Provided, Notifying CSW if Assistance is Needed with Referral Process: ~ Adult Day Care Programs  ~ In-Home Care & Respite Agencies ~ Home Health Care Agencies ~ Respite Care Agencies & Facilities ~ Personal Care Services Application  ~ Personal Care Services Agency Providers Encouraged Routine Engagement with Danford Bad, Licensed Clinical Social Worker with [facility] [phone]), if You Have Questions, Need Assistance, or If Additional Social Work Needs are Identified Between Now & Our Next Follow-Up Outreach Call, Scheduled on 07/14/2023 at 9:45 AM.      Our next appointment is by telephone on 07/14/2023 at 9:45  am.  Please call the care guide team at [phone] if you need to cancel or reschedule your appointment.   If you are experiencing a  Mental Health or Behavioral Health Crisis or need someone to talk to, please call the Suicide and Crisis Lifeline: 988 call the [place] National Suicide Prevention Lifeline: 757-178-6328 or TTY: (215)653-2022 TTY 406-212-6872) to talk to a trained counselor call 1-800-273-TALK (toll free, 24 hour hotline) go to [facility] [address], [place] [phone]) call the [facility] Crisis Line: [phone] call 911  Patient verbalizes understanding of instructions and care plan provided today and agrees to view in MyChart. Active MyChart status and patient understanding of how to access instructions and care plan via MyChart confirmed with patient.     Telephone follow up appointment with care management team member scheduled for:  07/14/2023 at 9:45 am.  Danford Bad, BSW, MSW, LCSW  Embedded Practice Social Work Case Manager  [facility], Population Health Direct Dial: [phone]  Fax: [phone] Email: [email or web address]: [email or web address]

## 2023-06-25 ENCOUNTER — Encounter: Payer: Self-pay | Admitting: *Deleted

## 2023-06-27 ENCOUNTER — Encounter: Payer: Self-pay | Admitting: Family Medicine

## 2023-06-27 ENCOUNTER — Ambulatory Visit: Payer: Medicare Other | Admitting: Professional Counselor

## 2023-06-27 ENCOUNTER — Ambulatory Visit (INDEPENDENT_AMBULATORY_CARE_PROVIDER_SITE_OTHER): Payer: Medicare Other | Admitting: Family Medicine

## 2023-06-27 VITALS — BP 120/74 | HR 86 | Resp 16 | Ht 60.0 in | Wt 184.4 lb

## 2023-06-27 DIAGNOSIS — F320 Major depressive disorder, single episode, mild: Secondary | ICD-10-CM

## 2023-06-27 DIAGNOSIS — M5440 Lumbago with sciatica, unspecified side: Secondary | ICD-10-CM

## 2023-06-27 DIAGNOSIS — I10 Essential (primary) hypertension: Secondary | ICD-10-CM | POA: Diagnosis not present

## 2023-06-27 DIAGNOSIS — R7303 Prediabetes: Secondary | ICD-10-CM | POA: Diagnosis not present

## 2023-06-27 DIAGNOSIS — G8929 Other chronic pain: Secondary | ICD-10-CM | POA: Diagnosis not present

## 2023-06-27 DIAGNOSIS — E785 Hyperlipidemia, unspecified: Secondary | ICD-10-CM

## 2023-06-27 DIAGNOSIS — E559 Vitamin D deficiency, unspecified: Secondary | ICD-10-CM | POA: Diagnosis not present

## 2023-06-27 DIAGNOSIS — Z0001 Encounter for general adult medical examination with abnormal findings: Secondary | ICD-10-CM

## 2023-06-27 MED ORDER — GABAPENTIN 300 MG PO CAPS: 300.0000 mg | ORAL_CAPSULE | Freq: Every day | ORAL | 5 refills | Status: DC

## 2023-06-27 NOTE — Patient Instructions (Signed)
If your symptoms worsen or you have thoughts of suicide/homicide, PLEASE SEEK IMMEDIATE MEDICAL ATTENTION.  You may always call:   National Suicide Hotline: 988 or 800-273-8255 [place] Crisis Line: 336-832-9700 Crisis Recovery in [place]: 800-939-5911      These are available 24 hours a day, 7 days a week.  

## 2023-06-27 NOTE — BH Specialist Note (Signed)
Collaborative Care Initial Assessment  Session Start time: 10:00    Session End time: 11:00  Total time in minutes: 60   Type of Contact:  Face to Face Patient consent obtained:  Yes Types of Service: Collaborative care  Summary  Patient is a 77 yo female being referred to collaborative care by her pcp for anxiety and depression and grief. Patient was engaged and cooperative during session.   Reason for referral in patient/family's own words:  "Grief, sadness"  Patient's goal for today's visit: "Get better"  History of Present illness:   hThe patient is a 77 year old female with a history of high blood pressure, high cholesterol, and depression, presenting for a collaborative care assessment. Her chief complaint is grief related to the recent passing of her husband, which has also resurfaced unresolved grief from the loss of her mother six years ago. She reports feeling overwhelmed, particularly as she navigates the process of sorting through her husband's belongings. She often avoids talking about him to suppress the emotional response, which allows her to function day-to-day, but thoughts of him bring tearfulness and sadness. During the session, she became visibly emotional while discussing her loss.  The patient also reports sleep disturbances, including difficulty falling asleep due to a wandering mind, waking multiple times during the night, and sleeping in until late morning. She feels this disrupts her daily rhythm and leaves her feeling as though she's lost part of her day. She uses a CPAP device for sleep apnea, which helps prevent interruptions in her breathing.  In addition to her grief, she experiences anxiety, particularly in social settings, where she avoids being around large groups. Her PHQ-9 score is 4, reflecting mild depressive symptoms, and her GAD-7 score is 10, indicating moderate anxiety. She has a history of a concerning medical incident involving dehydration and  disorientation, during which she became lost while driving and required emergency intervention. This event led her to stop driving and also contributed to a cessation of her water aerobics routine, which she had previously found beneficial.  The patient currently lives with her oldest granddaughter, which she finds supportive, and takes pride in her role as a mother and grandmother to her eight grandchildren. She is on Zoloft (25 mg) and Klonopin, and she is satisfied with her current medication regimen. She has no psychiatric history, no history of suicide attempts, hospitalizations, violence, trauma, or substance abuse.  At the recommendation of her primary care physician, she is seeking counseling to process her grief, explore the emotional weight she carries, and gain insight into the grief process. She hopes to find healthier ways to cope with her losses and address her anxiety. A psychiatric consultation will be scheduled to determine her appropriateness for collaborative care, and a follow-up will be arranged to finalize her treatment plan  Clinical Assessment   PHQ-9 Assessments:    06/27/2023   10:14 AM 05/22/2023   10:51 AM 05/08/2023   11:11 AM 01/31/2023   10:56 AM 12/20/2022    1:09 PM  Depression screen PHQ 2/9  Decreased Interest 1 1 0 0 1  Down, Depressed, Hopeless 0 1 1 0 0  PHQ - 2 Score 1 2 1  0 1  Altered sleeping 3 3 3  0 3  Tired, decreased energy 0 1 0 0 3  Change in appetite 0 1 0 0 3  Feeling bad or failure about yourself  0 0 0 0 0  Trouble concentrating 0 0 0 0 2  Moving slowly or fidgety/restless  0 0 0 0 1  Suicidal thoughts 0 0 0 0 0  PHQ-9 Score 4 7 4  0 13  Difficult doing work/chores  Somewhat difficult Somewhat difficult      GAD-7 Assessments:    06/27/2023   10:23 AM 05/08/2023   11:23 AM 12/20/2022    1:10 PM 12/12/2022    9:15 AM  GAD 7 : Generalized Anxiety Score  Nervous, Anxious, on Edge 3 2 0 0  Control/stop worrying 2 3 3  0  Worry too much -  different things 2 3 0 0  Trouble relaxing 2 3 3  0  Restless 0 3 3 0  Easily annoyed or irritable 1 2 0 0  Afraid - awful might happen 0 0 0 0  Total GAD 7 Score 10 16 9  0  Anxiety Difficulty Somewhat difficult   Not difficult at all     Social History:  Household: Oldest grandaughter staying with her. Marital status: Widowed Number of Children: 2 children and 8 grandchildren Employment: Retired Programme researcher, broadcasting/film/video: High school  Psychiatric Review of systems: Insomnia: None Changes in appetite: None Decreased need for sleep: No Family history of bipolar disorder: No Hallucinations: No   Paranoia: No    Psychotropic medications: Current medications: Zoloft 25 mg, Klonopin .5 Patient taking medications as prescribed:  Yes Side effects reported: No  Current medications (medication list) Current Outpatient Medications on File Prior to Visit  Medication Sig Dispense Refill   albuterol (VENTOLIN HFA) 108 (90 Base) MCG/ACT inhaler Inhale 2 puffs into the lungs every 6 (six) hours as needed for wheezing or shortness of breath. 8.5 each 1   amLODipine (NORVASC) 10 MG tablet Take 1 tablet (10 mg total) by mouth daily. 90 tablet 1   aspirin EC 81 MG tablet Take 81 mg by mouth daily. Swallow whole.     atorvastatin (LIPITOR) 20 MG tablet Take 1 tablet (20 mg total) by mouth daily. 90 tablet 3   BIOTIN PO Take by mouth.     Cholecalciferol (VITAMIN D3) 125 MCG (5000 UT) CAPS Take 1 capsule (5,000 Units total) by mouth daily. 30 capsule 11   clonazePAM (KLONOPIN) 0.5 MG tablet Take half tablet by mouth at bedtime, as needed, for sleep 20 tablet 0   cloNIDine (CATAPRES) 0.2 MG tablet Take 0.2 mg by mouth daily.     clotrimazole-betamethasone (LOTRISONE) cream Apply cream twice daily to rash in groin for 10 days, then as needed 45 g 1   famotidine (PEPCID) 10 MG tablet Take 10 mg by mouth 2 (two) times daily.     furosemide (LASIX) 40 MG tablet TAKE 1 TABLET BY MOUTH DAILY AS NEEDED FOR EDEMA OR  FLUID. 30 tablet 2   gabapentin (NEURONTIN) 300 MG capsule Take 300 mg by mouth daily.     Hydrocortisone (GERHARDT'S BUTT CREAM) CREA Apply 1 application topically 3 (three) times daily. 1 each 1   losartan-hydrochlorothiazide (HYZAAR) 100-25 MG tablet Take 1 tablet by mouth daily. 30 tablet 5   magnesium 30 MG tablet Take one tablet by mouth once daily 30 tablet 2   Melatonin 3 MG CAPS Take 1 capsule (3 mg total) by mouth at bedtime. 30 capsule 3   metoprolol succinate (TOPROL-XL) 50 MG 24 hr tablet TAKE 1 TABLET BY MOUTH  DAILY WITH OR IMMEDIATELY  FOLLOWING A MEAL 100 tablet 2   olopatadine (PATANOL) 0.1 % ophthalmic solution Place 1 drop into both eyes 2 (two) times daily. 5 mL 6   omeprazole (PRILOSEC) 20  MG capsule Take 1 capsule (20 mg total) by mouth daily. 100 capsule 2   potassium chloride SA (KLOR-CON M) 20 MEQ tablet Take 1 tablet (20 mEq total) by mouth daily. 100 tablet 2   pyridOXINE (VITAMIN B-6) 100 MG tablet Take 200 mg by mouth daily.     sertraline (ZOLOFT) 25 MG tablet Take 1 tablet (25 mg total) by mouth daily. 90 tablet 3   temazepam (RESTORIL) 7.5 MG capsule Take 1 capsule (7.5 mg total) by mouth at bedtime as needed for sleep. 30 capsule 5   No current facility-administered medications on file prior to visit.    Psychiatric History: Past psychiatry diagnosis: None Patient currently being seen by therapist/psychiatrist: No Prior Suicide Attempts: None Past psychiatry Hospitalization(s): No Past history of violence: None  Traumatic Experiences: History or current traumatic events (natural disaster, house fire, etc.)? no History or current physical trauma?  no History or current emotional trauma?  no History or current sexual trauma?  no History or current domestic or intimate partner violence?  no PTSD symptoms if any traumatic experiences no   Alcohol and/or Substance Use History   Tobacco Alcohol Other substances  Current use None None None  Past use      Past treatment      Withdrawal Potential: None  Self-harm Behaviors Risk Assessment Self-harm risk factors:  Depression, grief, anxiety, aging Patient endorses recent thoughts of harming self: Denies  Guns in the home: Yes they are locked in a safe   Protective factors: Kids, grandkids, and hopefulness, resilience   Danger to Others Risk Assessment Danger to others risk factors:  None Patient endorses recent thoughts of harming others:  Denies  Consulting civil engineer discussed emergency crisis plan with client and provided local emergency services resources.  Mental status exam:   General Appearance Luretha Murphy:  Casual Eye Contact:  Good Motor Behavior:  Normal Speech:  Normal Level of Consciousness:  Alert Mood:  Negative and Anxious Affect:  Appropriate Anxiety Level:  None Thought Process:  Coherent Thought Content:  WNL Perception:  Normal Judgment:  Good Insight:  Present  Diagnosis: Current mild episode of major depressive disorder, unspecified whether recurrent (HCC)    Goals: Increase healthy adjustment to current life circumstances   Interventions: Behavioral Activation and Supportive Counseling   Follow-up Plan: Refer to [facility] Outpatient Therapy

## 2023-06-27 NOTE — Progress Notes (Signed)
     [name]     MRN: [id number]      DOB: [birth year]  Chief Complaint  Patient presents with   Hypertension    6 week follow up     Back Pain    Has been hurting in her lower back across the bottom for awhile now. Thinks its due to arthritis    Annual Exam    Annual exam currently due so completed at visit    HPI: Patient is in for annual physical exam. Blood pressure is reviewed and is well controlled on current medication C/o low back pain, no new lower ext weakness or numbness , o new incontinence, no inciting factor Recent labs,  are reviewed. Immunization is reviewed , and  updated if needed.   PE: BP 120/74   Pulse 86   Resp 16   Ht 5' (1.524 m)   Wt 184 lb 6.4 oz (83.6 kg)   SpO2 98%   BMI 36.01 kg/m   Pleasant  female, alert and oriented x 3, in no cardio-pulmonary distress. Afebrile. HEENT No facial trauma or asymetry. Sinuses non tender.  Extra occullar muscles intact.. External ears normal, . Neck: decreased ROM, no adenopathy,JVD or thyromegaly.No bruits.  Chest: Clear to ascultation bilaterally.No crackles or wheezes. Non tender to palpation  Breast: Asymptomatic, not examined  Cardiovascular system; Heart sounds normal,  S1 and  S2 ,no S3.  No murmur, Peripheral pulses normal.  Abdomen: Soft, non tender,obese  .   Musculoskeletal exam: Decreased  ROM of spine, hips , shoulders and knees. Deformity ,swelling and  crepitus noted. No muscle wasting or atrophy.   Neurologic: Cranial nerves 2 to 12 intact. Power, tone ,sensation and reflexes normal throughout. No disturbance in gait. No tremor.  Skin: Intact, no ulceration, erythema , scaling or rash noted. Pigmentation normal throughout  Psych; Normal mood and affect. Judgement and concentration normal   Assessment & Plan:  Encounter for Medicare annual examination with abnormal findings Annual exam as documented. Counseling done  re healthy lifestyle involving  commitment to 150 minutes exercise per week, heart healthy diet, and attaining healthy weight.The importance of adequate sleep also discussed. lmmunization and cancer screening needs are specifically addressed at this visit.

## 2023-06-27 NOTE — Patient Instructions (Addendum)
F./U in 6 weeks, with medications , call if you need me sooner  Labs today , cmp  and EGFr, TSH, HBA1C and vit D  Please schedule wellness at checkout  Be careful not tofall  Thanks for choosing [facility], we consider it a privelige to serve you.

## 2023-06-28 LAB — VITAMIN D 25 HYDROXY (VIT D DEFICIENCY, FRACTURES): Vit D, 25-Hydroxy: 50.8 ng/mL (ref 30.0–100.0)

## 2023-06-28 LAB — CMP14+EGFR
ALT: 10 [IU]/L (ref 0–32)
AST: 22 [IU]/L (ref 0–40)
Albumin: 4.1 g/dL (ref 3.8–4.8)
Alkaline Phosphatase: 88 [IU]/L (ref 44–121)
BUN/Creatinine Ratio: 19 (ref 12–28)
BUN: 17 mg/dL (ref 8–27)
Bilirubin Total: 0.6 mg/dL (ref 0.0–1.2)
CO2: 22 mmol/L (ref 20–29)
Calcium: 10.1 mg/dL (ref 8.7–10.3)
Chloride: 103 mmol/L (ref 96–106)
Creatinine, Ser: 0.88 mg/dL (ref 0.57–1.00)
Globulin, Total: 3.5 g/dL (ref 1.5–4.5)
Glucose: 113 mg/dL — ABNORMAL HIGH (ref 70–99)
Potassium: 4.3 mmol/L (ref 3.5–5.2)
Sodium: 143 mmol/L (ref 134–144)
Total Protein: 7.6 g/dL (ref 6.0–8.5)
eGFR: 68 mL/min/{1.73_m2} (ref >59)

## 2023-06-28 LAB — TSH: TSH: 1.67 u[IU]/mL (ref 0.450–4.500)

## 2023-06-28 LAB — HEMOGLOBIN A1C
Est. average glucose Bld gHb Est-mCnc: 120 mg/dL
Hgb A1c MFr Bld: 5.8 % — ABNORMAL HIGH (ref 4.8–5.6)

## 2023-06-30 ENCOUNTER — Encounter: Payer: Self-pay | Admitting: Family Medicine

## 2023-07-01 ENCOUNTER — Telehealth: Payer: Self-pay | Admitting: Family Medicine

## 2023-07-01 MED ORDER — GABAPENTIN 100 MG PO CAPS: 100.0000 mg | ORAL_CAPSULE | Freq: Every day | ORAL | 3 refills | Status: DC

## 2023-07-01 NOTE — Telephone Encounter (Signed)
Please call  patient as well as the pharmacy [facility].   I have changed the dose of her gabapentin from 300 mg to 100 mg and that should start today please ensure that both know   Thank you  ?? Pls ask!

## 2023-07-02 NOTE — Telephone Encounter (Signed)
Patients son aware 

## 2023-07-03 ENCOUNTER — Encounter: Payer: Self-pay | Admitting: Family Medicine

## 2023-07-04 ENCOUNTER — Telehealth (INDEPENDENT_AMBULATORY_CARE_PROVIDER_SITE_OTHER): Payer: Medicare Other | Admitting: Professional Counselor

## 2023-07-04 DIAGNOSIS — F320 Major depressive disorder, single episode, mild: Secondary | ICD-10-CM

## 2023-07-04 MED ORDER — BENZONATATE 100 MG PO CAPS: 100.0000 mg | ORAL_CAPSULE | Freq: Two times a day (BID) | ORAL | 0 refills | Status: DC | PRN

## 2023-07-04 NOTE — BH Specialist Note (Signed)
Virtual Behavioral Health Treatment Plan Team Note  MRN: [id number] NAME: [name]  DATE: 07/04/23  Start time: Start Time: 0155 End time: Stop Time: 0200 Total time: Total Time in Minutes (Visit): 5  Total number of Virtual BH Treatment Team Plan encounters: 1/4  Treatment Team Attendees: Dr. Lolly Mustache and Esmond Harps  Diagnoses:    ICD-10-CM   1. Current mild episode of major depressive disorder, unspecified whether recurrent (HCC)  F32.0       Goals, Interventions and Follow-up Plan Goals: Increase healthy adjustment to current life circumstances Interventions: Behavioral Activation Supportive Counseling Medication Management Recommendations: Defer Follow-up Plan: Refer to Florencia Reasons for traditional therapy   History of the present illness Presenting Problem/Current Symptoms:  The patient is a 77 year old female with a history of high blood pressure, high cholesterol, and depression, presenting for a collaborative care assessment. Her chief complaint is grief related to the recent passing of her husband, which has also resurfaced unresolved grief from the loss of her mother six years ago. She reports feeling overwhelmed, particularly as she navigates the process of sorting through her husband's belongings. She often avoids talking about him to suppress the emotional response, which allows her to function day-to-day, but thoughts of him bring tearfulness and sadness. During the session, she became visibly emotional while discussing her loss.   The patient also reports sleep disturbances, including difficulty falling asleep due to a wandering mind, waking multiple times during the night, and sleeping in until late morning. She feels this disrupts her daily rhythm and leaves her feeling as though she's lost part of her day. She uses a CPAP device for sleep apnea, which helps prevent interruptions in her breathing.   In addition to her grief, she experiences anxiety, particularly  in social settings, where she avoids being around large groups. Her PHQ-9 score is 4, reflecting mild depressive symptoms, and her GAD-7 score is 10, indicating moderate anxiety. She has a history of a concerning medical incident involving dehydration and disorientation, during which she became lost while driving and required emergency intervention. This event led her to stop driving and also contributed to a cessation of her water aerobics routine, which she had previously found beneficial.   The patient currently lives with her oldest granddaughter, which she finds supportive, and takes pride in her role as a mother and grandmother to her eight grandchildren. She is on Zoloft (25 mg) and Klonopin, and she is satisfied with her current medication regimen. She has no psychiatric history, no history of suicide attempts, hospitalizations, violence, trauma, or substance abuse.   At the recommendation of her primary care physician, she is seeking counseling to process her grief, explore the emotional weight she carries, and gain insight into the grief process. She hopes to find healthier ways to cope with her losses and address her anxiety. A psychiatric consultation will be scheduled to determine her appropriateness for collaborative care, and a follow-up will be arranged to finalize her treatment plan   Screenings PHQ-9 Assessments:     06/27/2023   10:14 AM 05/22/2023   10:51 AM 05/08/2023   11:11 AM  Depression screen PHQ 2/9  Decreased Interest 1 1 0  Down, Depressed, Hopeless 0 1 1  PHQ - 2 Score 1 2 1   Altered sleeping 3 3 3   Tired, decreased energy 0 1 0  Change in appetite 0 1 0  Feeling bad or failure about yourself  0 0 0  Trouble concentrating 0 0 0  Moving slowly or fidgety/restless 0 0 0  Suicidal thoughts 0 0 0  PHQ-9 Score 4 7 4   Difficult doing work/chores  Somewhat difficult Somewhat difficult   GAD-7 Assessments:     06/27/2023   10:23 AM 05/08/2023   11:23 AM 12/20/2022     1:10 PM 12/12/2022    9:15 AM  GAD 7 : Generalized Anxiety Score  Nervous, Anxious, on Edge 3 2 0 0  Control/stop worrying 2 3 3  0  Worry too much - different things 2 3 0 0  Trouble relaxing 2 3 3  0  Restless 0 3 3 0  Easily annoyed or irritable 1 2 0 0  Afraid - awful might happen 0 0 0 0  Total GAD 7 Score 10 16 9  0  Anxiety Difficulty Somewhat difficult   Not difficult at all    Past Medical History Past Medical History:  Diagnosis Date   Asthma    Back pain    Depression    Diverticulosis of colon    GERD (gastroesophageal reflux disease)    Hyperlipidemia    Hypertension    Obesity    Osteoarthritis of knees, bilateral     Vital signs: There were no vitals filed for this visit.  Allergies:  Allergies as of 07/04/2023   (No Known Allergies)    Medication History Current medications:  Outpatient Encounter Medications as of 07/04/2023  Medication Sig   albuterol (VENTOLIN HFA) 108 (90 Base) MCG/ACT inhaler Inhale 2 puffs into the lungs every 6 (six) hours as needed for wheezing or shortness of breath.   amLODipine (NORVASC) 10 MG tablet Take 1 tablet (10 mg total) by mouth daily.   aspirin EC 81 MG tablet Take 81 mg by mouth daily. Swallow whole.   atorvastatin (LIPITOR) 20 MG tablet Take 1 tablet (20 mg total) by mouth daily.   benzonatate (TESSALON) 100 MG capsule Take 1 capsule (100 mg total) by mouth 2 (two) times daily as needed for cough.   BIOTIN PO Take by mouth.   Cholecalciferol (VITAMIN D3) 125 MCG (5000 UT) CAPS Take 1 capsule (5,000 Units total) by mouth daily.   clonazePAM (KLONOPIN) 0.5 MG tablet Take half tablet by mouth at bedtime, as needed, for sleep   cloNIDine (CATAPRES) 0.2 MG tablet Take 0.2 mg by mouth daily.   clotrimazole-betamethasone (LOTRISONE) cream Apply cream twice daily to rash in groin for 10 days, then as needed   famotidine (PEPCID) 10 MG tablet Take 10 mg by mouth 2 (two) times daily.   furosemide (LASIX) 40 MG tablet TAKE 1  TABLET BY MOUTH DAILY AS NEEDED FOR EDEMA OR FLUID.   gabapentin (NEURONTIN) 100 MG capsule Take 1 capsule (100 mg total) by mouth at bedtime.   Hydrocortisone (GERHARDT'S BUTT CREAM) CREA Apply 1 application topically 3 (three) times daily.   losartan-hydrochlorothiazide (HYZAAR) 100-25 MG tablet Take 1 tablet by mouth daily.   magnesium 30 MG tablet Take one tablet by mouth once daily   Melatonin 3 MG CAPS Take 1 capsule (3 mg total) by mouth at bedtime.   metoprolol succinate (TOPROL-XL) 50 MG 24 hr tablet TAKE 1 TABLET BY MOUTH  DAILY WITH OR IMMEDIATELY  FOLLOWING A MEAL   olopatadine (PATANOL) 0.1 % ophthalmic solution Place 1 drop into both eyes 2 (two) times daily.   omeprazole (PRILOSEC) 20 MG capsule Take 1 capsule (20 mg total) by mouth daily.   potassium chloride SA (KLOR-CON M) 20 MEQ tablet Take 1 tablet (20  mEq total) by mouth daily.   pyridOXINE (VITAMIN B-6) 100 MG tablet Take 200 mg by mouth daily.   sertraline (ZOLOFT) 25 MG tablet Take 1 tablet (25 mg total) by mouth daily.   temazepam (RESTORIL) 7.5 MG capsule Take 1 capsule (7.5 mg total) by mouth at bedtime as needed for sleep.   No facility-administered encounter medications on file as of 07/04/2023.     Scribe for Treatment Team: Reuel Boom

## 2023-07-07 ENCOUNTER — Ambulatory Visit (HOSPITAL_COMMUNITY)
Admission: RE | Admit: 2023-07-07 | Discharge: 2023-07-07 | Disposition: A | Discharge status: Home / Self Care | Payer: Medicare Other | Source: Ambulatory Visit | Attending: Family Medicine | Admitting: Family Medicine

## 2023-07-07 DIAGNOSIS — Z1231 Encounter for screening mammogram for malignant neoplasm of breast: Secondary | ICD-10-CM | POA: Diagnosis not present

## 2023-07-08 ENCOUNTER — Other Ambulatory Visit (HOSPITAL_COMMUNITY): Payer: Self-pay | Admitting: Family Medicine

## 2023-07-08 DIAGNOSIS — R928 Other abnormal and inconclusive findings on diagnostic imaging of breast: Secondary | ICD-10-CM

## 2023-07-10 ENCOUNTER — Ambulatory Visit: Payer: Medicare Other | Admitting: Professional Counselor

## 2023-07-10 DIAGNOSIS — F320 Major depressive disorder, single episode, mild: Secondary | ICD-10-CM

## 2023-07-10 NOTE — BH Specialist Note (Signed)
[place] Virtual BH Telephone Follow-up  MRN: [id number] NAME: [name] Date: 07/10/23  Start time: Start Time: 0230 End time: Stop Time: 0300 Total time: Total Time in Minutes (Visit): 30 Call number: Visit Number: 3- Third Visit  Reason for call today:  The patient is a 77 year old female presenting for a collaborative care follow-up. The primary purpose of this visit was to discuss her psychiatric consultation and determine next steps based on her current symptoms and needs. It was decided to refer the patient to traditional therapy, as she had previously established a good rapport with a therapist. The behavioral health counselor reached out to Florencia Reasons at [facility] to coordinate this referral, which is now in progress.  In the meantime, the patient continues to experience significant sleep difficulties, including trouble falling and staying asleep. She reports that her current sleep medication is not sufficiently relaxing her. When asked which medication she is taking, the patient was unsure and appeared confused about her medications overall, expressing difficulty managing them.  The patient also mentioned a potential interest in home health support, as she is finding it increasingly challenging to keep up with daily responsibilities. To address this, the behavioral health counselor will collaborate with her primary care physician and care coordinator to explore available options for additional support.  The patient will be out of town for a week, and a follow-up check-in is scheduled in two weeks to monitor her symptoms and ensure she transitions smoothly to traditional therapy and other appropriate resources.  PHQ-9 Scores:     06/27/2023   10:14 AM 05/22/2023   10:51 AM 05/08/2023   11:11 AM 01/31/2023   10:56 AM 12/20/2022    1:09 PM  Depression screen PHQ 2/9  Decreased Interest 1 1 0 0 1  Down, Depressed, Hopeless 0 1 1 0 0  PHQ - 2 Score 1 2 1  0 1  Altered  sleeping 3 3 3  0 3  Tired, decreased energy 0 1 0 0 3  Change in appetite 0 1 0 0 3  Feeling bad or failure about yourself  0 0 0 0 0  Trouble concentrating 0 0 0 0 2  Moving slowly or fidgety/restless 0 0 0 0 1  Suicidal thoughts 0 0 0 0 0  PHQ-9 Score 4 7 4  0 13  Difficult doing work/chores  Somewhat difficult Somewhat difficult     GAD-7 Scores:     06/27/2023   10:23 AM 05/08/2023   11:23 AM 12/20/2022    1:10 PM 12/12/2022    9:15 AM  GAD 7 : Generalized Anxiety Score  Nervous, Anxious, on Edge 3 2 0 0  Control/stop worrying 2 3 3  0  Worry too much - different things 2 3 0 0  Trouble relaxing 2 3 3  0  Restless 0 3 3 0  Easily annoyed or irritable 1 2 0 0  Afraid - awful might happen 0 0 0 0  Total GAD 7 Score 10 16 9  0  Anxiety Difficulty Somewhat difficult   Not difficult at all    Stress Current stressors:  Medical issues Sleep:  poor Appetite:  fair Coping ability:  Good  Patient taking medications as prescribed:  Unsure  Current medications:  Outpatient Encounter Medications as of 07/10/2023  Medication Sig   albuterol (VENTOLIN HFA) 108 (90 Base) MCG/ACT inhaler Inhale 2 puffs into the lungs every 6 (six) hours as needed for wheezing or shortness of breath.   amLODipine (NORVASC) 10  MG tablet Take 1 tablet (10 mg total) by mouth daily.   aspirin EC 81 MG tablet Take 81 mg by mouth daily. Swallow whole.   atorvastatin (LIPITOR) 20 MG tablet Take 1 tablet (20 mg total) by mouth daily.   benzonatate (TESSALON) 100 MG capsule Take 1 capsule (100 mg total) by mouth 2 (two) times daily as needed for cough.   BIOTIN PO Take by mouth.   Cholecalciferol (VITAMIN D3) 125 MCG (5000 UT) CAPS Take 1 capsule (5,000 Units total) by mouth daily.   clonazePAM (KLONOPIN) 0.5 MG tablet Take half tablet by mouth at bedtime, as needed, for sleep   cloNIDine (CATAPRES) 0.2 MG tablet Take 0.2 mg by mouth daily.   clotrimazole-betamethasone (LOTRISONE) cream Apply cream twice daily to  rash in groin for 10 days, then as needed   famotidine (PEPCID) 10 MG tablet Take 10 mg by mouth 2 (two) times daily.   furosemide (LASIX) 40 MG tablet TAKE 1 TABLET BY MOUTH DAILY AS NEEDED FOR EDEMA OR FLUID.   gabapentin (NEURONTIN) 100 MG capsule Take 1 capsule (100 mg total) by mouth at bedtime.   Hydrocortisone (GERHARDT'S BUTT CREAM) CREA Apply 1 application topically 3 (three) times daily.   losartan-hydrochlorothiazide (HYZAAR) 100-25 MG tablet Take 1 tablet by mouth daily.   magnesium 30 MG tablet Take one tablet by mouth once daily   Melatonin 3 MG CAPS Take 1 capsule (3 mg total) by mouth at bedtime.   metoprolol succinate (TOPROL-XL) 50 MG 24 hr tablet TAKE 1 TABLET BY MOUTH  DAILY WITH OR IMMEDIATELY  FOLLOWING A MEAL   olopatadine (PATANOL) 0.1 % ophthalmic solution Place 1 drop into both eyes 2 (two) times daily.   omeprazole (PRILOSEC) 20 MG capsule Take 1 capsule (20 mg total) by mouth daily.   potassium chloride SA (KLOR-CON M) 20 MEQ tablet Take 1 tablet (20 mEq total) by mouth daily.   pyridOXINE (VITAMIN B-6) 100 MG tablet Take 200 mg by mouth daily.   sertraline (ZOLOFT) 25 MG tablet Take 1 tablet (25 mg total) by mouth daily.   temazepam (RESTORIL) 7.5 MG capsule Take 1 capsule (7.5 mg total) by mouth at bedtime as needed for sleep.   No facility-administered encounter medications on file as of 07/10/2023.   Substance Use Assessment Patient recently consumed alcohol:  No  Alcohol Use Disorder Identification Test (AUDIT):     05/01/2020    9:24 AM 05/02/2021    4:21 PM 05/22/2023   10:52 AM  Alcohol Use Disorder Test (AUDIT)  1. How often do you have a drink containing alcohol? 0 0 0  2. How many drinks containing alcohol do you have on a typical day when you are drinking? 0 0 0  3. How often do you have six or more drinks on one occasion? 0 0 0  AUDIT-C Score 0 0 0  Alcohol Brief Interventions/Follow-up AUDIT Score <7 follow-up not indicated      Goals,  Interventions and Follow-up Plan Goals: Improve medication compliance Interventions: Medication Monitoring Follow-up Plan: Refer to [facility] Outpatient Therapy   Reuel Boom

## 2023-07-14 ENCOUNTER — Ambulatory Visit: Payer: Self-pay | Admitting: *Deleted

## 2023-07-14 ENCOUNTER — Encounter: Payer: Self-pay | Admitting: Internal Medicine

## 2023-07-14 ENCOUNTER — Encounter: Payer: Self-pay | Admitting: Family Medicine

## 2023-07-14 DIAGNOSIS — F409 Phobic anxiety disorder, unspecified: Secondary | ICD-10-CM

## 2023-07-14 DIAGNOSIS — J9611 Chronic respiratory failure with hypoxia: Secondary | ICD-10-CM

## 2023-07-14 NOTE — Telephone Encounter (Signed)
Please advise on possible sleep referral. Thanks!

## 2023-07-14 NOTE — Patient Instructions (Signed)
Visit Information  Thank you for taking time to visit with me today. Please don't hesitate to contact me if I can be of assistance to you.   Following are the goals we discussed today:   Goals Addressed               This Visit's Progress     Obtain Assistance in The Home. 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, Decreased Hearing, Complicated Grief, Anxiety & Depression.]  General Interventions   General Interventions Discussed/Reviewed General Interventions Discussed, General Interventions Reviewed, Doctor Visits, Communication with, Level of Care, [facility], Horticulturist, commercial (DME)  [Encouraged Routine Engagement with Care Team Members.]  Vaccines COVID-19, Flu, Pneumonia, RSV, Shingles, Tetanus/Pertussis/Diphtheria  [Encouraged Annual Vaccinations.]  Doctor Visits Discussed/Reviewed Doctor Visits Discussed, Specialist, Doctor Visits Reviewed, Annual Wellness Visits, PCP  [Encouraged Routine Engagement with Care Team Members.]  Health Screening Bone Density, Colonoscopy, Mammogram  [Encouraged Annual Health Screenings.]  Durable Medical Equipment (DME) BP Cuff, Environmental consultant, Wheelchair, Other  [Prescription Eyeglasses, Medical laboratory scientific officer, Manufacturing systems engineer, Scales & Grab Bars in Shower.]  Wheelchair Standard  PCP/Specialist Visits Compliance with follow-up visit  [Encouraged Routine Engagement with Care Team Members.]  Communication with PCP/Specialists, Charity fundraiser, Pharmacists, Social Work  [employer] Routine Engagement with Care Team Members.]  Level of Care Adult Daycare, Air traffic controller, Assisted Living, Skilled Nursing Facility  [Confirmed  Disinterest in Enrollment in Adult Day Care Program or Receiving Assistance Pursuing Higher Level of Care Placement Options (I.e Assisted Living Versus Skilled Nursing Facility).]  Applications Medicaid, Personal Care Services  [Confirmed Disinterest in Applying for Medicaid or Personal Care Services.]  Exercise Interventions   Exercise Discussed/Reviewed Exercise Discussed, Assistive device use and maintanence, Exercise Reviewed, Physical Activity, Weight Managment  [Encouraged Daily Exercise Regimen, as Tolerated.]  Physical Activity Discussed/Reviewed Physical Activity Discussed, Home Exercise Program (HEP), Physical Activity Reviewed, PREP, Gym, Types of exercise  [Encouraged Increased Exercise & Level of Activity, Inside & Outside the Home.]  Weight Management Weight loss  [Encouraged Healthy Weight Loss Program.]  Education Interventions   Education Provided Provided Education  [Thoroughly Reviewed Educational Material & Entertained Questions to Ensure Understanding.]  Provided Verbal Education On Nutrition, Mental Health/Coping with Illness, When to see the doctor, [employer], [employer], Medication, Exercise, Applications  [Encouraged Consideration & Implementation of Educational Material.]  Ship broker, Personal Care Services  [Confirmed Disinterest in Applying for Medicaid or Personal Care Services.]  Mental Health Interventions   Mental Health Discussed/Reviewed Mental Health Discussed, Anxiety, Depression, Mental Health Reviewed, Grief and Loss, Substance Abuse, Coping Strategies, Suicide, Crisis, Other  [Assessed Mental Health & Cognitive Status.]  Nutrition Interventions   Nutrition Discussed/Reviewed Nutrition Discussed, Adding fruits and vegetables, Increasing proteins, Decreasing fats, Nutrition Reviewed, Fluid intake, Carbohydrate meal planning, Portion sizes, Decreasing sugar intake, Decreasing salt  [Encouraged Heart-Healthy, Low Sodium, Low Fat, Reduced Sugar  Diet.]  Pharmacy Interventions   Pharmacy Dicussed/Reviewed Pharmacy Topics Discussed, Medications and their functions, Pharmacy Topics Reviewed, Medication Adherence, Affording Medications  [Confirmed Ability to Afford Prescription Medications.]  Medication Adherence --  [Confirmed Prescription Medication Compliance.]  Safety Interventions   Safety Discussed/Reviewed Safety Discussed, Safety Reviewed, Fall Risk, Home Safety  [Encouraged Use of Home  Assistive Devices & Durable Medical Equipment.]  Home Safety Assistive Devices, Need for home safety assessment  [Encouraged Consideration of Home Safety Evaluation.]  Advanced Directive Interventions   Advanced Directives Discussed/Reviewed Advanced Directives Discussed, Advanced Directives Reviewed  [Confirmed Initiation of Advanced Directives (Living Will & Healthcare Power of Attorney Documents) & Copies Requested to Scan into Electronic Medical Record in Epic.]       Active Listening & Reflection Utilized.  Verbalization of Feelings Encouraged.  Emotional Support Provided. Cognitive Behavioral Therapy Performed. Acceptance & Commitment Therapy Initiated. Encouraged Routine Engagement with Danford Bad, Licensed Clinical Social Worker with [facility] [phone]), if You Have Questions, Need Assistance, or If Additional Social Work Needs are Identified Between Now & Our Next Follow-Up Outreach Call, Scheduled on 08/12/2023 at 9:15 AM.      Our next appointment is by telephone on 08/12/2023 at 9:15 am.  Please call the care guide team at [phone] if you need to cancel or reschedule your appointment.   If you are experiencing a Mental Health or Behavioral Health Crisis or need someone to talk to, please call the Suicide and Crisis Lifeline: 988 call the [place] National Suicide Prevention Lifeline: (434)140-2901 or TTY: 539-498-3118 TTY (450) 316-2514) to talk to a trained counselor call 1-800-273-TALK (toll free, 24  hour hotline) go to [facility] [address], [place] [phone]) call the [facility] Crisis Line: [phone] call 911  Patient verbalizes understanding of instructions and care plan provided today and agrees to view in MyChart. Active MyChart status and patient understanding of how to access instructions and care plan via MyChart confirmed with patient.     Telephone follow up appointment with care management team member scheduled for:  08/12/2023 at 9:15 am.  Danford Bad, BSW, MSW, LCSW  [employer] Social Work Case Manager  [facility], Population Health Direct Dial: [phone]  Fax: [phone] Email: [email or web address]: [email or web address]

## 2023-07-14 NOTE — Patient Outreach (Signed)
Care Coordination   Follow Up Visit Note   07/14/2023  Name: [name] MRN: [id number] DOB: [birth year]  [name] is a 77 y.o. year old female who sees Kerri Perches, MD for primary care. I spoke with patient's son, [name], [name]. by phone today. Patient is currently spending time with her brother, but residing with oldest granddaughter.  What matters to the patients health and wellness today?  Obtain Assistance in The Home.   Goals Addressed               This Visit's Progress     Obtain Assistance in The Home. 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, Decreased Hearing, Complicated Grief, Anxiety & Depression.]  General Interventions   General Interventions Discussed/Reviewed General Interventions Discussed, General Interventions Reviewed, Doctor Visits, Communication with, Level of Care, [facility], Horticulturist, commercial (DME)  [Encouraged Routine Engagement with Care Team Members.]  Vaccines COVID-19, Flu, Pneumonia, RSV, Shingles, Tetanus/Pertussis/Diphtheria  [Encouraged Annual Vaccinations.]  Doctor Visits Discussed/Reviewed Doctor Visits Discussed, Specialist, Doctor Visits Reviewed, Annual Wellness Visits, PCP  [Encouraged Routine Engagement with Care Team Members.]  Health Screening Bone Density, Colonoscopy, Mammogram  [Encouraged Annual Health Screenings.]  Durable Medical Equipment (DME) BP Cuff, Environmental consultant, Wheelchair, Other  [Prescription Eyeglasses, Medical laboratory scientific officer, Manufacturing systems engineer, Scales & Grab Bars in Shower.]  Wheelchair Standard  PCP/Specialist Visits Compliance with follow-up visit   [Encouraged Routine Engagement with Care Team Members.]  Communication with PCP/Specialists, Charity fundraiser, Pharmacists, Social Work  [employer] Routine Engagement with Care Team Members.]  Level of Care Adult Daycare, Air traffic controller, Assisted Living, Skilled Nursing Facility  [Confirmed Disinterest in Enrollment in Adult Day Care Program or Receiving Assistance Pursuing Higher Level of Care Placement Options (I.e Assisted Living Versus Skilled Nursing Facility).]  Applications Medicaid, Personal Care Services  [Confirmed Disinterest in Applying for Medicaid or Personal Care Services.]  Exercise Interventions   Exercise Discussed/Reviewed Exercise Discussed, Assistive device use and maintanence, Exercise Reviewed, Physical Activity, Weight Managment  [Encouraged Daily Exercise Regimen, as Tolerated.]  Physical Activity Discussed/Reviewed Physical Activity Discussed, Home Exercise Program (HEP), Physical Activity Reviewed, PREP, Gym, Types of exercise  [Encouraged Increased Exercise & Level of Activity, Inside & Outside the Home.]  Weight Management Weight loss  [Encouraged Healthy Weight Loss Program.]  Education Interventions   Education Provided Provided Education  [Thoroughly Reviewed Educational Material & Entertained Questions to Ensure Understanding.]  Provided Verbal Education On Nutrition, Mental Health/Coping with Illness, When to see the doctor, [employer], [employer], Medication, Exercise, Applications  [Encouraged Consideration & Implementation of Educational Material.]  Ship broker, Personal Care Services  [Confirmed Disinterest in Applying for Medicaid or Personal Care Services.]  Mental Health Interventions   Mental Health Discussed/Reviewed Mental Health Discussed, Anxiety, Depression, Mental Health Reviewed, Grief and Loss, Substance Abuse, Coping Strategies, Suicide, Crisis, Other  [Assessed Mental Health & Cognitive Status.]  Nutrition Interventions   Nutrition  Discussed/Reviewed Nutrition Discussed, Adding fruits and vegetables, Increasing proteins, Decreasing fats, Nutrition Reviewed, Fluid intake, Carbohydrate meal planning, Portion sizes, Decreasing sugar intake, Decreasing salt  [Encouraged Heart-Healthy, Low Sodium, Low Fat, Reduced Sugar Diet.]  Pharmacy Interventions   Pharmacy Dicussed/Reviewed Pharmacy Topics Discussed, Medications and their  functions, Pharmacy Topics Reviewed, Medication Adherence, Affording Medications  [Confirmed Ability to Afford Prescription Medications.]  Medication Adherence --  [Confirmed Prescription Medication Compliance.]  Safety Interventions   Safety Discussed/Reviewed Safety Discussed, Safety Reviewed, Fall Risk, Home Safety  [Encouraged Use of Home Assistive Devices & Durable Medical Equipment.]  Home Safety Assistive Devices, Need for home safety assessment  [Encouraged Consideration of Home Safety Evaluation.]  Advanced Directive Interventions   Advanced Directives Discussed/Reviewed Advanced Directives Discussed, Advanced Directives Reviewed  [Confirmed Initiation of Advanced Directives (Living Will & Healthcare Power of Attorney Documents) & Copies Requested to Scan into Electronic Medical Record in Epic.]       Active Listening & Reflection Utilized.  Verbalization of Feelings Encouraged.  Emotional Support Provided. Cognitive Behavioral Therapy Performed. Acceptance & Commitment Therapy Initiated. Encouraged Routine Engagement with Danford Bad, Licensed Clinical Social Worker with [facility] [phone]), if You Have Questions, Need Assistance, or If Additional Social Work Needs are Identified Between Now & Our Next Follow-Up Outreach Call, Scheduled on 08/12/2023 at 9:15 AM.      SDOH assessments and interventions completed:  Yes.  Care Coordination Interventions:  Yes, provided.   Follow up plan: Follow up call scheduled for 08/12/2023 at 9:15 am.  Encounter Outcome:   Patient Visit Completed.   Danford Bad, BSW, MSW, Printmaker Social Work Case Set designer Health  [facility], Population Health Direct Dial: [phone]  Fax: [phone] Email: [email or web address]

## 2023-07-21 ENCOUNTER — Encounter: Payer: Self-pay | Admitting: Family Medicine

## 2023-07-21 DIAGNOSIS — Z0001 Encounter for general adult medical examination with abnormal findings: Secondary | ICD-10-CM | POA: Insufficient documentation

## 2023-07-21 NOTE — Assessment & Plan Note (Signed)
Chronic limits activity and mobility, topical meds preferred and home exercise / PT

## 2023-07-21 NOTE — Assessment & Plan Note (Signed)
Annual exam as documented. Counseling done  re healthy lifestyle involving commitment to 150 minutes exercise per week, heart healthy diet, and attaining healthy weight.The importance of adequate sleep also discussed. lmmunization and cancer screening needs are specifically addressed at this visit.

## 2023-07-21 NOTE — Assessment & Plan Note (Signed)
Controlled, no change in medication  

## 2023-08-04 ENCOUNTER — Encounter: Payer: Self-pay | Admitting: Internal Medicine

## 2023-08-12 ENCOUNTER — Ambulatory Visit: Payer: Self-pay | Admitting: *Deleted

## 2023-08-12 ENCOUNTER — Encounter: Payer: Self-pay | Admitting: *Deleted

## 2023-08-12 NOTE — Patient Instructions (Signed)
Visit Information  Thank you for taking time to visit with me today. Please don't hesitate to contact me if I can be of assistance to you.   Following are the goals we discussed today:   Goals Addressed               This Visit's Progress     COMPLETED: Obtain Assistance in The Home. 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, Decreased Hearing, Complicated Grief, Anxiety & Depression.]  General Interventions   General Interventions Discussed/Reviewed General Interventions Discussed, General Interventions Reviewed, Doctor Visits, Communication with, Level of Care, [facility], Horticulturist, commercial (DME)  [Encouraged Routine Engagement with Care Team Members.]  Vaccines COVID-19, Flu, Pneumonia, RSV, Shingles, Tetanus/Pertussis/Diphtheria  [Encouraged Annual Vaccinations.]  Doctor Visits Discussed/Reviewed Doctor Visits Discussed, Specialist, Doctor Visits Reviewed, Annual Wellness Visits, PCP  [Encouraged Routine Engagement with Care Team Members.]  Health Screening Bone Density, Colonoscopy, Mammogram  [Encouraged Annual Health Screenings.]  Durable Medical Equipment (DME) BP Cuff, Environmental consultant, Wheelchair, Other  [Prescription Eyeglasses, Medical laboratory scientific officer, Manufacturing systems engineer, Scales & Grab Bars in Shower.]  Wheelchair Standard  PCP/Specialist Visits Compliance with follow-up visit  [Encouraged Routine Engagement with Care Team Members.]  Communication with PCP/Specialists, Charity fundraiser, Pharmacists, Social Work  [employer] Routine Engagement with Care Team Members.]  Level of Care Adult Daycare, Air traffic controller, Assisted Living, Skilled Nursing Facility  [Confirmed  Disinterest in Enrollment in Adult Day Care Program or Receiving Assistance Pursuing Higher Level of Care Placement Options (I.e Assisted Living Versus Skilled Nursing Facility).]  Applications Medicaid, Personal Care Services  [Confirmed Disinterest in Applying for Medicaid or Personal Care Services.]  Exercise Interventions   Exercise Discussed/Reviewed Exercise Discussed, Assistive device use and maintanence, Exercise Reviewed, Physical Activity, Weight Managment  [Encouraged Daily Exercise Regimen, as Tolerated.]  Physical Activity Discussed/Reviewed Physical Activity Discussed, Home Exercise Program (HEP), Physical Activity Reviewed, PREP, Gym, Types of exercise  [Encouraged Increased Exercise & Level of Activity, Inside & Outside the Home.]  Weight Management Weight loss  [Encouraged Healthy Weight Loss Program.]  Education Interventions   Education Provided Provided Education  [Thoroughly Reviewed Educational Material & Entertained Questions to Ensure Understanding.]  Provided Verbal Education On Nutrition, Mental Health/Coping with Illness, When to see the doctor, [employer], [employer], Medication, Exercise, Applications  [Encouraged Consideration & Implementation of Educational Material.]  Ship broker, Personal Care Services  [Confirmed Disinterest in Applying for Medicaid or Personal Care Services.]  Mental Health Interventions   Mental Health Discussed/Reviewed Mental Health Discussed, Anxiety, Depression, Mental Health Reviewed, Grief and Loss, Substance Abuse, Coping Strategies, Suicide, Crisis, Other  [Assessed Mental Health & Cognitive Status.]  Nutrition Interventions   Nutrition Discussed/Reviewed Nutrition Discussed, Adding fruits and vegetables, Increasing proteins, Decreasing fats, Nutrition Reviewed, Fluid intake, Carbohydrate meal planning, Portion sizes, Decreasing sugar intake, Decreasing salt  [Encouraged Heart-Healthy, Low Sodium, Low Fat, Reduced Sugar  Diet.]  Pharmacy Interventions   Pharmacy Dicussed/Reviewed Pharmacy Topics Discussed, Medications and their functions, Pharmacy Topics Reviewed, Medication Adherence, Affording Medications  [Confirmed Ability to Afford Prescription Medications.]  Medication Adherence --  [Confirmed Prescription Medication Compliance.]  Safety Interventions   Safety Discussed/Reviewed Safety Discussed, Safety Reviewed, Fall Risk, Home Safety  [Encouraged Use of Home  Assistive Devices & Durable Medical Equipment.]  Home Safety Assistive Devices, Need for home safety assessment  [Encouraged Consideration of Home Safety Evaluation.]  Advanced Directive Interventions   Advanced Directives Discussed/Reviewed Advanced Directives Discussed, Advanced Directives Reviewed  [Confirmed Initiation of Advanced Directives (Living Will & Healthcare Power of Attorney Documents) & Copies Requested to Scan into Electronic Medical Record in Epic.]      Active Listening & Reflection Utilized.  Verbalization of Feelings Encouraged.  Emotional Support Provided. Cognitive Behavioral Therapy Initiated. Acceptance & Commitment Therapy Performed. Client-Centered Therapy Conducted. Encouraged Engagement with Danford Bad, Licensed Clinical Social Worker with [facility] [phone]), if You Have Questions, Need Assistance, Additional Social Work Needs are Identified in The Near Future, or If You Change Your Mind About Wanting to Receive Social Work Services.      Please call the care guide team at [phone] if you need to cancel or reschedule your appointment.   If you are experiencing a Mental Health or Behavioral Health Crisis or need someone to talk to, please call the Suicide and Crisis Lifeline: 988 call the [place] National Suicide Prevention Lifeline: 204 565 0205 or TTY: (380)477-5730 TTY 406-331-3175) to talk to a trained counselor call 1-800-273-TALK (toll free, 24 hour hotline) go to [facility] [address], [place] [id number]) call the [facility] Crisis Line: 458-794-4698 call 911  Patient verbalizes understanding of instructions and care plan provided today and agrees to view in MyChart. Active MyChart status and patient understanding of how to access instructions and care plan via MyChart confirmed with patient.     No further follow up required.  Danford Bad, BSW, MSW, LCSW [facility], [facility] Clinical Social Worker II Direct Dial: [phone]  Fax: [phone] Website: [email or web address]

## 2023-08-12 NOTE — Patient Outreach (Signed)
Care Coordination   Follow Up Visit Note   08/12/2023  Name: [name] MRN: [id number] DOB: [birth year]  DORATHA Wagner is a 78 y.o. year old female who sees Kerri Perches, MD for primary care. I spoke with [name] by phone today.  What matters to the patients health and wellness today?  Obtain Assistance in The Home.    Goals Addressed               This Visit's Progress     COMPLETED: Obtain Assistance in The Home. 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, Decreased Hearing, Complicated Grief, Anxiety & Depression.]  General Interventions   General Interventions Discussed/Reviewed General Interventions Discussed, General Interventions Reviewed, Doctor Visits, Communication with, Level of Care, [facility], Horticulturist, commercial (DME)  [Encouraged Routine Engagement with Care Team Members.]  Vaccines COVID-19, Flu, Pneumonia, RSV, Shingles, Tetanus/Pertussis/Diphtheria  [Encouraged Annual Vaccinations.]  Doctor Visits Discussed/Reviewed Doctor Visits Discussed, Specialist, Doctor Visits Reviewed, Annual Wellness Visits, PCP  [Encouraged Routine Engagement with Care Team Members.]  Health Screening Bone Density, Colonoscopy, Mammogram  [Encouraged Annual Health Screenings.]  Durable Medical Equipment (DME) BP Cuff, Environmental consultant, Wheelchair, Other  [Prescription Eyeglasses, Medical laboratory scientific officer, Manufacturing systems engineer, Scales & Grab Bars in Shower.]  Wheelchair Standard  PCP/Specialist Visits Compliance with follow-up visit  [Encouraged Routine Engagement with Care Team Members.]  Communication with PCP/Specialists, Charity fundraiser, Pharmacists, Social  Work  [employer] Routine Engagement with Care Team Members.]  Level of Care Adult Daycare, Air traffic controller, Assisted Living, Skilled Nursing Facility  [Confirmed Disinterest in Enrollment in Adult Day Care Program or Receiving Assistance Pursuing Higher Level of Care Placement Options (I.e Assisted Living Versus Skilled Nursing Facility).]  Applications Medicaid, Personal Care Services  [Confirmed Disinterest in Applying for Medicaid or Personal Care Services.]  Exercise Interventions   Exercise Discussed/Reviewed Exercise Discussed, Assistive device use and maintanence, Exercise Reviewed, Physical Activity, Weight Managment  [Encouraged Daily Exercise Regimen, as Tolerated.]  Physical Activity Discussed/Reviewed Physical Activity Discussed, Home Exercise Program (HEP), Physical Activity Reviewed, PREP, Gym, Types of exercise  [Encouraged Increased Exercise & Level of Activity, Inside & Outside the Home.]  Weight Management Weight loss  [Encouraged Healthy Weight Loss Program.]  Education Interventions   Education Provided Provided Education  [Thoroughly Reviewed Educational Material & Entertained Questions to Ensure Understanding.]  Provided Verbal Education On Nutrition, Mental Health/Coping with Illness, When to see the doctor, [employer], [employer], Medication, Exercise, Applications  [Encouraged Consideration & Implementation of Educational Material.]  Ship broker, Personal Care Services  [Confirmed Disinterest in Applying for Medicaid or Personal Care Services.]  Mental Health Interventions   Mental Health Discussed/Reviewed Mental Health Discussed, Anxiety, Depression, Mental Health Reviewed, Grief and Loss, Substance Abuse, Coping Strategies, Suicide, Crisis, Other  [Assessed Mental Health & Cognitive Status.]  Nutrition Interventions   Nutrition Discussed/Reviewed Nutrition Discussed, Adding fruits and vegetables, Increasing proteins, Decreasing fats, Nutrition Reviewed,  Fluid intake, Carbohydrate meal planning, Portion sizes, Decreasing sugar intake, Decreasing salt  [Encouraged Heart-Healthy, Low Sodium, Low Fat, Reduced Sugar Diet.]  Pharmacy Interventions   Pharmacy Dicussed/Reviewed Pharmacy Topics Discussed, Medications and their functions, Pharmacy Topics Reviewed, Medication Adherence, Affording Medications  [Confirmed Ability to [employer] Prescription  Medications.]  Medication Adherence --  [Confirmed Prescription Medication Compliance.]  Safety Interventions   Safety Discussed/Reviewed Safety Discussed, Safety Reviewed, Fall Risk, Home Safety  [Encouraged Use of Home Assistive Devices & Durable Medical Equipment.]  Home Safety Assistive Devices, Need for home safety assessment  [Encouraged Consideration of Home Safety Evaluation.]  Advanced Directive Interventions   Advanced Directives Discussed/Reviewed Advanced Directives Discussed, Advanced Directives Reviewed  [Confirmed Initiation of Advanced Directives (Living Will & Healthcare Power of Attorney Documents) & Copies Requested to Scan into Electronic Medical Record in Epic.]      Active Listening & Reflection Utilized.  Verbalization of Feelings Encouraged.  Emotional Support Provided. Cognitive Behavioral Therapy Initiated. Acceptance & Commitment Therapy Performed. Client-Centered Therapy Conducted. Encouraged Engagement with Danford Bad, Licensed Clinical Social Worker with [facility] [phone]), if You Have Questions, Need Assistance, Additional Social Work Needs are Identified in The Near Future, or If You Change Your Mind About Wanting to Receive Social Work Services.        SDOH assessments and interventions completed:  Yes.  SDOH Interventions Today    Flowsheet Row Most Recent Value  SDOH Interventions   Food Insecurity Interventions Intervention Not Indicated  Housing Interventions Intervention Not Indicated  Transportation Interventions Intervention Not  Indicated, Patient Resources (Friends/Family)  Utilities Interventions Intervention Not Indicated  Alcohol Usage Interventions Intervention Not Indicated (Score <7)  Depression Interventions/Treatment  Medication, Counseling, Currently on Treatment, Community Resources Provided  Financial Strain Interventions Intervention Not Indicated  Physical Activity Interventions Intervention Not Indicated, Community Resources Provided  Stress Interventions Intervention Not Indicated, Offered [employer], [employer] Provided, Provide Counseling  Social Connections Interventions Intervention Not Indicated, Community Resources Provided  Health Literacy Interventions Intervention Not Indicated     Care Coordination Interventions:  Yes, provided.   Follow up plan: No further intervention required.   Encounter Outcome:  Patient Visit Completed.   Danford Bad, BSW, MSW, Printmaker Social Work Case Set designer Health  [facility], Population Health Direct Dial: [phone]  Fax: [phone] Email: [email or web address]

## 2023-08-20 ENCOUNTER — Ambulatory Visit (INDEPENDENT_AMBULATORY_CARE_PROVIDER_SITE_OTHER): Payer: Medicare Other | Admitting: Family Medicine

## 2023-08-20 ENCOUNTER — Encounter: Payer: Self-pay | Admitting: Family Medicine

## 2023-08-20 VITALS — BP 107/70 | HR 78 | Ht 60.0 in | Wt 182.1 lb

## 2023-08-20 DIAGNOSIS — F419 Anxiety disorder, unspecified: Secondary | ICD-10-CM

## 2023-08-20 DIAGNOSIS — E785 Hyperlipidemia, unspecified: Secondary | ICD-10-CM

## 2023-08-20 DIAGNOSIS — R7303 Prediabetes: Secondary | ICD-10-CM | POA: Diagnosis not present

## 2023-08-20 DIAGNOSIS — F409 Phobic anxiety disorder, unspecified: Secondary | ICD-10-CM

## 2023-08-20 DIAGNOSIS — F5105 Insomnia due to other mental disorder: Secondary | ICD-10-CM

## 2023-08-20 DIAGNOSIS — F32A Depression, unspecified: Secondary | ICD-10-CM

## 2023-08-20 DIAGNOSIS — F322 Major depressive disorder, single episode, severe without psychotic features: Secondary | ICD-10-CM

## 2023-08-20 DIAGNOSIS — I10 Essential (primary) hypertension: Secondary | ICD-10-CM | POA: Diagnosis not present

## 2023-08-20 MED ORDER — TEMAZEPAM 15 MG PO CAPS: 15.0000 mg | ORAL_CAPSULE | Freq: Every evening | ORAL | 2 refills | Status: DC | PRN

## 2023-08-20 MED ORDER — SERTRALINE HCL 50 MG PO TABS: 50.0000 mg | ORAL_TABLET | Freq: Every day | ORAL | 3 refills | Status: DC

## 2023-08-20 NOTE — Patient Instructions (Addendum)
F/U in 8  to 10 weeks re evaluate depression and sleep and blood pressure, call if you need me sooner  New higher dose of sertraline is 50 mg 1 daily.  New higher dose of Restoril is 15 mg 1 at bedtime.  You may start driving between the hours of 10 AM and 4 PM just within [place] city limits.  Having a dog as a companion will absolutely improve your overall health and we will provide you with a letter stating that you do have and need a service dog.  The send a message to your GI provider which is Dr. Jena Gauss or Dr. Queen Blossom office regarding your colonoscopy which is due between April and June  Thanks for choosing [facility], we consider it a privelige to serve you.

## 2023-08-20 NOTE — Progress Notes (Signed)
[name]     MRN: [id number]      DOB: 1945-12-18  Chief Complaint  Patient presents with   Follow-up    Follow up    HPI [name] is here for follow up and re-evaluation of chronic medical conditions, medication management and review of any available recent lab and radiology data.  Preventive health is updated, specifically  Cancer screening and Immunization.   Questions or concerns regarding consultations or procedures which the PT has had in the interim are  addressed.Neurology evaluated her  approximately 6 months ago with no follow up or additional testing planned Son accompanies Mother, major concern is that she have a service dog as she is very depressed dealing with recent lodss of spouse and internal family strife Also that she be able to drive with limitations Bot are reasonable and deemed medically approprite ROS Denies recent fever or chills. Denies sinus pressure, nasal congestion, ear pain or sore throat. Denies chest congestion, productive cough or wheezing. Denies chest pains, palpitations and leg swelling Denies abdominal pain, nausea, vomiting,diarrhea or constipation.   Denies dysuria, frequency, hesitancy or incontinence. Denies   uncontrolled joint pain, swelling and limitation in mobility. Denies headaches, seizures, numbness, or tingling. C/o depression , anxiety and insomnia Denies skin break down or rash.   PE  BP 107/70 (BP Location: Left Arm, Patient Position: Sitting, Cuff Size: Large)   Pulse 78   Ht 5' (1.524 m)   Wt 182 lb 1.9 oz (82.6 kg)   SpO2 92%   BMI 35.57 kg/m   Patient alert and oriented and in no cardiopulmonary distress.  HEENT: No facial asymmetry, EOMI,     Neck supple .  Chest: Clear to auscultation bilaterally.  CVS: S1, S2 no murmurs, no S3.Regular rate.  ABD: Soft non tender.   Ext: No edema  MS: Adequate though reduced  ROM spine, shoulders, hips and knees.  Skin: Intact, no ulcerations or rash  noted.  Psych: Good eye contact, normal affect. Memory intact mildly  anxious and  depressed appearing. Tearful at times CNS: CN 2-12 intact, power,  normal throughout.no focal deficits noted.   Assessment & Plan  Depression, major, single episode, severe (HCC) Increase dose of zoloft and continue therapy, re eval in 8 weeks  Anxiety and depression Uncontrolled inc zoloft dose an and re eval in 8 weeks, service dog appropriate  Insomnia due to anxiety and fear Inadequately treated Sleep hygiene reviewed and written information offered also. Prescription sent for  medication needed. Inc dose of restoril andre eval  Essential hypertension DASH diet and commitment to daily physical activity for a minimum of 30 minutes discussed and encouraged, as a part of hypertension management. The importance of attaining a healthy weight is also discussed.     08/20/2023    9:40 AM 08/20/2023    9:39 AM 06/27/2023   11:36 AM 06/27/2023   10:40 AM 05/08/2023   11:11 AM 05/05/2023    9:05 AM 03/26/2023    2:57 PM  BP/Weight  Systolic BP 107 142 120 137 118 142 121  Diastolic BP 70 77 74 70 69 67 67  Wt. (Lbs)  182.12  184.4 178 178   BMI  35.57 kg/m2  36.01 kg/m2 34.76 kg/m2 34.76 kg/m2      Elevarted at visit , re eval ptiot to dose change as generally well controlled  Hyperlipidemia LDL goal <100 Hyperlipidemia:Low fat diet discussed and encouraged.   Lipid Panel  Lab Results  Component Value Date   CHOL 150 11/28/2022   HDL 57 11/28/2022   LDLCALC 77 11/28/2022   TRIG 83 11/28/2022   CHOLHDL 2.6 11/28/2022     Updated lab needed at/ before next visit.   Prediabetes Patient educated about the importance of limiting  Carbohydrate intake , the need to commit to daily physical activity for a minimum of 30 minutes , and to commit weight loss. The fact that changes in all these areas will reduce or eliminate all together the development of diabetes is stressed.  Improved which  is good     Latest Ref Rng & Units 06/27/2023   11:55 AM 01/31/2023   11:45 AM 12/12/2022   10:33 AM 11/28/2022   10:23 AM 06/18/2022   11:57 AM  Diabetic Labs  HbA1c 4.8 - 5.6 % 5.8    6.1  6.8   Chol 100 - 199 mg/dL    098  119   HDL >14 mg/dL    57  72   Calc LDL 0 - 99 mg/dL    77  782   Triglycerides 0 - 149 mg/dL    83  77   Creatinine 0.57 - 1.00 mg/dL 9.56  2.13  0.86  5.78  1.04       08/20/2023    9:40 AM 08/20/2023    9:39 AM 06/27/2023   11:36 AM 06/27/2023   10:40 AM 05/08/2023   11:11 AM 05/05/2023    9:05 AM 03/26/2023    2:57 PM  BP/Weight  Systolic BP 107 142 120 137 118 142 121  Diastolic BP 70 77 74 70 69 67 67  Wt. (Lbs)  182.12  184.4 178 178   BMI  35.57 kg/m2  36.01 kg/m2 34.76 kg/m2 34.76 kg/m2       Latest Ref Rng & Units 09/18/2021   12:00 AM 03/13/2010   12:00 AM  Foot/eye exam completion dates  Eye Exam No Retinopathy No Retinopathy       Foot exam Order   yes      This result is from an external source.

## 2023-08-25 DIAGNOSIS — F322 Major depressive disorder, single episode, severe without psychotic features: Secondary | ICD-10-CM | POA: Insufficient documentation

## 2023-08-25 NOTE — Assessment & Plan Note (Signed)
DASH diet and commitment to daily physical activity for a minimum of 30 minutes discussed and encouraged, as a part of hypertension management. The importance of attaining a healthy weight is also discussed.     08/20/2023    9:40 AM 08/20/2023    9:39 AM 06/27/2023   11:36 AM 06/27/2023   10:40 AM 05/08/2023   11:11 AM 05/05/2023    9:05 AM 03/26/2023    2:57 PM  BP/Weight  Systolic BP 107 142 120 137 118 142 121  Diastolic BP 70 77 74 70 69 67 67  Wt. (Lbs)  182.12  184.4 178 178   BMI  35.57 kg/m2  36.01 kg/m2 34.76 kg/m2 34.76 kg/m2      Elevarted at visit , re eval ptiot to dose change as generally well controlled

## 2023-08-25 NOTE — Assessment & Plan Note (Addendum)
Patient educated about the importance of limiting  Carbohydrate intake , the need to commit to daily physical activity for a minimum of 30 minutes , and to commit weight loss. The fact that changes in all these areas will reduce or eliminate all together the development of diabetes is stressed.  Improved which is good     Latest Ref Rng & Units 06/27/2023   11:55 AM 01/31/2023   11:45 AM 12/12/2022   10:33 AM 11/28/2022   10:23 AM 06/18/2022   11:57 AM  Diabetic Labs  HbA1c 4.8 - 5.6 % 5.8    6.1  6.8   Chol 100 - 199 mg/dL    295  621   HDL >30 mg/dL    57  72   Calc LDL 0 - 99 mg/dL    77  865   Triglycerides 0 - 149 mg/dL    83  77   Creatinine 0.57 - 1.00 mg/dL 7.84  6.96  2.95  2.84  1.04       08/20/2023    9:40 AM 08/20/2023    9:39 AM 06/27/2023   11:36 AM 06/27/2023   10:40 AM 05/08/2023   11:11 AM 05/05/2023    9:05 AM 03/26/2023    2:57 PM  BP/Weight  Systolic BP 107 142 120 137 118 142 121  Diastolic BP 70 77 74 70 69 67 67  Wt. (Lbs)  182.12  184.4 178 178   BMI  35.57 kg/m2  36.01 kg/m2 34.76 kg/m2 34.76 kg/m2       Latest Ref Rng & Units 09/18/2021   12:00 AM 03/13/2010   12:00 AM  Foot/eye exam completion dates  Eye Exam No Retinopathy No Retinopathy       Foot exam Order   yes      This result is from an external source.

## 2023-08-25 NOTE — Assessment & Plan Note (Addendum)
Uncontrolled inc zoloft dose an and re eval in 8 weeks, service dog appropriate

## 2023-08-25 NOTE — Assessment & Plan Note (Signed)
Hyperlipidemia:Low fat diet discussed and encouraged.   Lipid Panel  Lab Results  Component Value Date   CHOL 150 11/28/2022   HDL 57 11/28/2022   LDLCALC 77 11/28/2022   TRIG 83 11/28/2022   CHOLHDL 2.6 11/28/2022     Updated lab needed at/ before next visit.

## 2023-08-25 NOTE — Assessment & Plan Note (Signed)
Inadequately treated Sleep hygiene reviewed and written information offered also. Prescription sent for  medication needed. Inc dose of restoril andre eval

## 2023-08-25 NOTE — Assessment & Plan Note (Signed)
Increase dose of zoloft and continue therapy, re eval in 8 weeks

## 2023-08-26 ENCOUNTER — Encounter (HOSPITAL_COMMUNITY): Payer: Self-pay

## 2023-08-26 ENCOUNTER — Other Ambulatory Visit (HOSPITAL_COMMUNITY): Payer: Self-pay | Admitting: Family Medicine

## 2023-08-26 ENCOUNTER — Ambulatory Visit (HOSPITAL_COMMUNITY)
Admission: RE | Admit: 2023-08-26 | Discharge: 2023-08-26 | Disposition: A | Discharge status: Home / Self Care | Payer: Medicare Other | Source: Ambulatory Visit | Attending: Family Medicine | Admitting: Family Medicine

## 2023-08-26 DIAGNOSIS — R928 Other abnormal and inconclusive findings on diagnostic imaging of breast: Secondary | ICD-10-CM

## 2023-08-26 DIAGNOSIS — N6313 Unspecified lump in the right breast, lower outer quadrant: Secondary | ICD-10-CM | POA: Diagnosis not present

## 2023-08-26 DIAGNOSIS — N6314 Unspecified lump in the right breast, lower inner quadrant: Secondary | ICD-10-CM | POA: Diagnosis not present

## 2023-08-26 DIAGNOSIS — R92321 Mammographic fibroglandular density, right breast: Secondary | ICD-10-CM | POA: Diagnosis not present

## 2023-08-27 ENCOUNTER — Other Ambulatory Visit (HOSPITAL_COMMUNITY): Payer: Self-pay | Admitting: Family Medicine

## 2023-08-27 DIAGNOSIS — R928 Other abnormal and inconclusive findings on diagnostic imaging of breast: Secondary | ICD-10-CM

## 2023-09-05 ENCOUNTER — Other Ambulatory Visit: Payer: Self-pay | Admitting: Family Medicine

## 2023-09-09 ENCOUNTER — Ambulatory Visit (HOSPITAL_COMMUNITY)
Admission: RE | Admit: 2023-09-09 | Discharge: 2023-09-09 | Disposition: A | Discharge status: Home / Self Care | Payer: Medicare Other | Source: Ambulatory Visit | Attending: Family Medicine | Admitting: Family Medicine

## 2023-09-09 ENCOUNTER — Encounter (HOSPITAL_COMMUNITY): Payer: Self-pay

## 2023-09-09 ENCOUNTER — Other Ambulatory Visit (HOSPITAL_COMMUNITY): Payer: Self-pay | Admitting: Family Medicine

## 2023-09-09 DIAGNOSIS — R928 Other abnormal and inconclusive findings on diagnostic imaging of breast: Secondary | ICD-10-CM

## 2023-09-09 DIAGNOSIS — N6314 Unspecified lump in the right breast, lower inner quadrant: Secondary | ICD-10-CM | POA: Diagnosis not present

## 2023-09-09 DIAGNOSIS — C50311 Malignant neoplasm of lower-inner quadrant of right female breast: Secondary | ICD-10-CM | POA: Diagnosis not present

## 2023-09-09 DIAGNOSIS — N641 Fat necrosis of breast: Secondary | ICD-10-CM | POA: Diagnosis not present

## 2023-09-09 DIAGNOSIS — C50919 Malignant neoplasm of unspecified site of unspecified female breast: Secondary | ICD-10-CM

## 2023-09-09 HISTORY — DX: Malignant neoplasm of unspecified site of unspecified female breast: C50.919

## 2023-09-09 HISTORY — PX: BREAST BIOPSY: SHX20

## 2023-09-09 MED ORDER — LIDOCAINE-EPINEPHRINE (PF) 1 %-1:200000 IJ SOLN
10.0000 mL | Freq: Once | INTRAMUSCULAR | Status: AC
  Administered 2023-09-09: 10 mL via INTRADERMAL

## 2023-09-09 MED ORDER — LIDOCAINE HCL (PF) 2 % IJ SOLN
10.0000 mL | Freq: Once | INTRAMUSCULAR | Status: AC
  Administered 2023-09-09: 10 mL

## 2023-09-09 MED ORDER — LIDOCAINE-EPINEPHRINE (PF) 1 %-1:200000 IJ SOLN
INTRAMUSCULAR | Status: AC
Start: 2023-09-09 — End: ?
  Filled 2023-09-09: qty 30

## 2023-09-09 MED ORDER — LIDOCAINE HCL (PF) 2 % IJ SOLN
INTRAMUSCULAR | Status: AC
  Filled 2023-09-09: qty 20

## 2023-09-09 NOTE — Progress Notes (Signed)
 PT tolerated right breast biopsy well today with NAD noted. PT verbalized understanding of discharge instructions. PT ambulated back to the mammogram area this time and given ice packs to use at home. Biopsy specimens taken to lab by Colette from ultrasound for processing.

## 2023-09-10 ENCOUNTER — Encounter: Payer: Self-pay | Admitting: Family Medicine

## 2023-09-11 LAB — SURGICAL PATHOLOGY

## 2023-09-12 ENCOUNTER — Telehealth: Payer: Self-pay

## 2023-09-12 NOTE — Telephone Encounter (Signed)
 Copied from CRM [id number]. Topic: Clinical - Lab/Test Results >> Sep 12, 2023 10:18 AM Geroge Baseman wrote: Reason for CRM: Bonita Quin from [facility] calling to report information about patients recent breat biopsy that was ordered by dr simpson. Please call Number [phone]

## 2023-09-15 NOTE — Progress Notes (Signed)
 [facility] [address] [place] [address]   Clinic Day:  09/16/2023  Referring physician: Kerri Perches, MD  Patient Care Team: Kerri Perches, MD as PCP - General Diona Browner Illene Bolus, MD as PCP - Cardiology (Cardiology) West Bali, MD (Inactive) as Consulting Physician (Gastroenterology) Doreatha Massed, MD as Medical Oncologist (Medical Oncology) Therese Sarah, RN as Oncology Nurse Navigator (Medical Oncology)   ASSESSMENT & PLAN:   Assessment:  1.  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  0.5 cm right axillary lymph node which appears to have lost its fatty hilum and another right axillary lymph node with upper limits of normal cortical thickness 3.7 mm. - Right breast mass biopsy (09/09/2023): Invasive poorly differentiated ductal adenocarcinoma, grade 3, ER/PR negative, HER2 (0+), Ki 67: 30%.  Lymph node could not be biopsied as it was not visible on ultrasound.  2. Social/Family History: -Lives at home with her granddaughter. Retired from [employer]. No tobacco use. No ETOH consumption. No chemical exposures.  -2 Maternal aunts had breast cancer. Maternal uncle had prostate cancer. Maternal niece had breast cancer.   Plan:  1.  Clinical stage II (T1c N1) TNBC of the right breast LIQ: - We reviewed mammogram findings and biopsy results in detail. - Recommend routine labs with LFTs, PET scan to complete staging. - Also recommend genetic testing given the family history and triple negative nature. - She has consultation with surgery soon.  She may proceed with surgery (BCS with lymph node biopsy) if there is no metastatic disease on the PET scan. - She will require adjuvant chemotherapy after  surgery.   Orders Placed This Encounter  Procedures   NM PET Image Initial (PI) Skull Base To Thigh    Standing Status:   Future    Expected Date:   09/23/2023    Expiration Date:   09/15/2024    If indicated for the ordered procedure, I authorize the administration of a radiopharmaceutical per Radiology protocol:   Yes    Preferred imaging location?:   [facility]    Release to patient:   Immediate   Genetic Screening Order    Standing Status:   Future    Number of Occurrences:   1    Expected Date:   09/16/2023    Expiration Date:   09/15/2024    Release to patient:   Immediate   CBC with Differential    Standing Status:   Future    Number of Occurrences:   1    Expected Date:   09/16/2023    Expiration Date:   09/15/2024   Comprehensive metabolic panel    Standing Status:   Future    Number of Occurrences:   1    Expected Date:   09/16/2023    Expiration Date:   09/15/2024   Cancer antigen 27.29    Standing Status:   Future    Number of Occurrences:   1    Expected Date:   09/16/2023    Expiration Date:   09/15/2024   Cancer antigen 15-3    Standing Status:   Future    Number of Occurrences:   1    Expected Date:   09/16/2023    Expiration  Date:   09/15/2024      Alben Deeds Teague,acting as a scribe for Doreatha Massed, MD.,have documented all relevant documentation on the behalf of Doreatha Massed, MD,as directed by  Doreatha Massed, MD while in the presence of Doreatha Massed, MD.   I, Doreatha Massed MD, have reviewed the above documentation for accuracy and completeness, and I agree with the above.   Doreatha Massed, MD   2/25/20252:11 PM  CHIEF COMPLAINT/PURPOSE OF CONSULT:   Diagnosis: Right breast triple negative breast cancer.   Cancer Staging  Breast cancer of lower-inner quadrant of right female breast [facility]) Staging form: Breast, AJCC 8th Edition - Clinical stage from 09/16/2023: Stage IIB (cT1c, cN1, cM0, G3, ER-, PR-, HER2-) -  Unsigned    Prior Therapy: None  Current Therapy: Under workup   HISTORY OF PRESENT ILLNESS:   Oncology History   No history exists.      [name] is a 78 y.o. female presenting to clinic today for evaluation of triple negative right breast at the request of Kerri Perches, MD.  Patient underwent bilateral breast MM on 07/07/23 that found: In the right breast, a mass warrants further evaluation. In the left breast, no findings suspicious for malignancy. Right breast diagnostic MM and [place] was done on 08/26/23 that showed: 1.8 cm suspicious mass within the lower inner RIGHT breast. Two slightly abnormal appearing RIGHT axillary lymph nodes.  [name] then had a biopsy of the 4 o'clock mass in the right breast on 09/09/23 that revealed: Invasive poorly differentiated ductal adenocarcinoma with extensive necrosis, grade 3. The mass was ER-/PR-/Her2- (0+).  Today, she states that she is doing well overall. Her appetite level is at 100%. Her energy level is at 50%. She is accompanied by her brother and her son and daughter on the phone.   Desma had menarche around age 43 and menopause in her early 75's. She was given some sort of medication for a year to help with hot flashes. She is unaware if she has taken hormone replacements. Skylee took birth control for 2-3 years. She has 2 children with her first childbirth at age 70.   Tamakia denies any unintentional weight loss, new onset pains, or peripheral neuropathy. She has no prior history of breast biopsy. She has heart flutters, but denies any MI's, TIA's, or CVA's. Back pain is chronic.   [place] reports intermittent ankle swellings, she attributes to 2 total knee arthroplasties in 2005.   PAST MEDICAL HISTORY:   Past Medical History: Past Medical History:  Diagnosis Date   Asthma    Back pain    Depression    Diverticulosis of colon    GERD (gastroesophageal reflux disease)    Hyperlipidemia    Hypertension    Obesity    Osteoarthritis  of knees, bilateral     Surgical History: Past Surgical History:  Procedure Laterality Date   ABDOMINAL HYSTERECTOMY     BREAST BIOPSY Right 09/09/2023   [place] RT BREAST BX W LOC DEV 1ST LESION IMG BX SPEC US GUIDE 09/09/2023 Edwin Cap, MD [facility]-ULTRASOUND   COLONOSCOPY  07/23/2003   Dr. Jerolyn Shin Smith:numerous large scattered diverticula   COLONOSCOPY N/A 11/15/2013   Dr. Darrick Penna: moderate diverticula, small internal hemorrhoids, redudant colon. Next TCS in 2025 with overtube.    ESOPHAGOGASTRODUODENOSCOPY (EGD) WITH ESOPHAGEAL DILATION N/A 11/15/2013   Dr. 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 moderate erosive gastritis, negative H.pylori   MULTIPLE TOOTH EXTRACTIONS  2024   TOTAL KNEE ARTHROPLASTY  06/01/2004  left / Dr. Romeo Apple   TOTAL KNEE ARTHROPLASTY  11/29/2003   right / Dr. Romeo Apple   TUBAL LIGATION      Social History: Social History   Socioeconomic History   Marital status: Married    Spouse name: Asa Fath   Number of children: 2   Years of education: 12   Highest education level: 12th grade  Occupational History   Occupation: retired     Associate Professor: UNEMPLOYED  Tobacco Use   Smoking status: Never    Passive exposure: Never   Smokeless tobacco: Never  Vaping Use   Vaping status: Never Used  Substance and Sexual Activity   Alcohol use: No   Drug use: No   Sexual activity: Not Currently    Partners: Male    Birth control/protection: Surgical  Other Topics Concern   Not on file  Social History Narrative   Not on file   Social Drivers of Health   Financial Resource Strain: Low Risk  (08/12/2023)   Overall Financial Resource Strain (CARDIA)    Difficulty of Paying Living Expenses: Not hard at all  Food Insecurity: No Food Insecurity (08/12/2023)   Hunger Vital Sign    Worried About Running Out of Food in the Last Year: Never true    Ran Out of Food in the Last Year: Never true  Transportation Needs: No Transportation Needs (08/12/2023)    PRAPARE - Administrator, Civil Service (Medical): No    Lack of Transportation (Non-Medical): No  Physical Activity: Sufficiently Active (08/12/2023)   Exercise Vital Sign    Days of Exercise per Week: 5 days    Minutes of Exercise per Session: 50 min  Stress: No Stress Concern Present (08/12/2023)   [employer] of Occupational Health - Occupational Stress Questionnaire    Feeling of Stress : Only a little  Social Connections: Moderately Integrated (08/12/2023)   Social Connection and Isolation Panel [NHANES]    Frequency of Communication with Friends and Family: More than three times a week    Frequency of Social Gatherings with Friends and Family: More than three times a week    Attends Religious Services: More than 4 times per year    Active Member of [employer] or Organizations: Yes    Attends Banker Meetings: More than 4 times per year    Marital Status: Widowed  Intimate Partner Violence: Not At Risk (08/12/2023)   Humiliation, Afraid, Rape, and Kick questionnaire    Fear of Current or Ex-Partner: No    Emotionally Abused: No    Physically Abused: No    Sexually Abused: No    Family History: Family History  Problem Relation Age of Onset   Diabetes Mother    Hypertension Mother    Heart disease Mother    Aneurysm Father    Diabetes Father    Hypertension Brother    Diabetes Brother    Diabetes Brother    Hypertension Brother    Hypertension Son    Breast cancer Maternal Aunt    Colon cancer Maternal Uncle     Current Medications:  Current Outpatient Medications:    albuterol (VENTOLIN HFA) 108 (90 Base) MCG/ACT inhaler, Inhale 2 puffs into the lungs every 6 (six) hours as needed for wheezing or shortness of breath., Disp: 8.5 each, Rfl: 1   amLODipine (NORVASC) 10 MG tablet, Take 1 tablet (10 mg total) by mouth daily., Disp: 90 tablet, Rfl: 1   aspirin EC 81 MG tablet, Take  81 mg by mouth daily. Swallow whole., Disp: , Rfl:    atorvastatin  (LIPITOR) 20 MG tablet, Take 1 tablet (20 mg total) by mouth daily., Disp: 90 tablet, Rfl: 3   benzonatate (TESSALON) 100 MG capsule, Take 1 capsule (100 mg total) by mouth 2 (two) times daily as needed for cough., Disp: 20 capsule, Rfl: 0   BIOTIN PO, Take by mouth., Disp: , Rfl:    Cholecalciferol (VITAMIN D3) 125 MCG (5000 UT) CAPS, Take 1 capsule (5,000 Units total) by mouth daily., Disp: 30 capsule, Rfl: 11   clonazePAM (KLONOPIN) 0.5 MG tablet, Take half tablet by mouth at bedtime, as needed, for sleep, Disp: 20 tablet, Rfl: 0   cloNIDine (CATAPRES) 0.2 MG tablet, Take 0.2 mg by mouth daily., Disp: , Rfl:    clotrimazole-betamethasone (LOTRISONE) cream, Apply cream twice daily to rash in groin for 10 days, then as needed, Disp: 45 g, Rfl: 1   famotidine (PEPCID) 10 MG tablet, Take 10 mg by mouth 2 (two) times daily., Disp: , Rfl:    furosemide (LASIX) 40 MG tablet, TAKE 1 TABLET BY MOUTH DAILY AS NEEDED FOR EDEMA OR FLUID., Disp: 30 tablet, Rfl: 2   gabapentin (NEURONTIN) 100 MG capsule, Take 1 capsule (100 mg total) by mouth at bedtime., Disp: 30 capsule, Rfl: 3   gabapentin (NEURONTIN) 300 MG capsule, TAKE 1 CAPSULE BY MOUTH AT  BEDTIME, Disp: 100 capsule, Rfl: 2   Hydrocortisone (GERHARDT'S BUTT CREAM) CREA, Apply 1 application topically 3 (three) times daily., Disp: 1 each, Rfl: 1   losartan-hydrochlorothiazide (HYZAAR) 100-25 MG tablet, Take 1 tablet by mouth daily., Disp: 30 tablet, Rfl: 5   magnesium 30 MG tablet, Take one tablet by mouth once daily, Disp: 30 tablet, Rfl: 2   Melatonin 3 MG CAPS, Take 1 capsule (3 mg total) by mouth at bedtime., Disp: 30 capsule, Rfl: 3   metoprolol succinate (TOPROL-XL) 50 MG 24 hr tablet, TAKE 1 TABLET BY MOUTH  DAILY WITH OR IMMEDIATELY  FOLLOWING A MEAL, Disp: 100 tablet, Rfl: 2   olopatadine (PATANOL) 0.1 % ophthalmic solution, Place 1 drop into both eyes 2 (two) times daily., Disp: 5 mL, Rfl: 6   omeprazole (PRILOSEC) 20 MG capsule, Take 1 capsule  (20 mg total) by mouth daily., Disp: 100 capsule, Rfl: 2   potassium chloride SA (KLOR-CON M) 20 MEQ tablet, Take 1 tablet (20 mEq total) by mouth daily., Disp: 100 tablet, Rfl: 2   pyridOXINE (VITAMIN B-6) 100 MG tablet, Take 200 mg by mouth daily., Disp: , Rfl:    sertraline (ZOLOFT) 50 MG tablet, Take 1 tablet (50 mg total) by mouth daily., Disp: 30 tablet, Rfl: 3   temazepam (RESTORIL) 15 MG capsule, Take 1 capsule (15 mg total) by mouth at bedtime as needed for sleep., Disp: 30 capsule, Rfl: 2   Allergies: No Known Allergies  REVIEW OF SYSTEMS:   Review of Systems  Constitutional:  Negative for chills, fatigue and fever.  HENT:   Negative for lump/mass, mouth sores, nosebleeds, sore throat and trouble swallowing.   Eyes:  Negative for eye problems.  Respiratory:  Positive for shortness of breath. Negative for cough.   Cardiovascular:  Negative for chest pain, leg swelling and palpitations.  Gastrointestinal:  Negative for abdominal pain, constipation, diarrhea, nausea and vomiting.  Genitourinary:  Negative for bladder incontinence, difficulty urinating, dysuria, frequency, hematuria and nocturia.   Musculoskeletal:  Positive for back pain (6-7/10 severity). Negative for arthralgias, flank pain, myalgias and neck  pain.  Skin:  Negative for itching and rash.  Neurological:  Negative for dizziness, headaches and numbness.  Hematological:  Does not bruise/bleed easily.  Psychiatric/Behavioral:  Positive for sleep disturbance. Negative for depression and suicidal ideas. The patient is not nervous/anxious.   All other systems reviewed and are negative.    VITALS:   Blood pressure (!) 149/90, pulse 82, temperature (!) 96.7 F (35.9 C), temperature source Tympanic, resp. rate 20, height 5' 1.02" (1.55 m), weight 186 lb 1.1 oz (84.4 kg), SpO2 99%.  Wt Readings from Last 3 Encounters:  09/16/23 186 lb 1.1 oz (84.4 kg)  08/20/23 182 lb 1.9 oz (82.6 kg)  06/27/23 184 lb 6.4 oz (83.6 kg)     Body mass index is 35.13 kg/m.  Performance status (ECOG): 1 - Symptomatic but completely ambulatory  PHYSICAL EXAM:   Physical Exam Vitals and nursing note reviewed. Exam conducted with a chaperone present.  Constitutional:      Appearance: Normal appearance.  Cardiovascular:     Rate and Rhythm: Normal rate and regular rhythm.     Pulses: Normal pulses.     Heart sounds: Normal heart sounds.  Pulmonary:     Effort: Pulmonary effort is normal.     Breath sounds: Normal breath sounds.  Chest:     Comments: +2 cm mass in lower inner quadrant of right breast, tenderness upon palpation Abdominal:     Palpations: Abdomen is soft. There is no hepatomegaly, splenomegaly or mass.     Tenderness: There is no abdominal tenderness.  Musculoskeletal:     Right lower leg: No edema.     Left lower leg: No edema.  Lymphadenopathy:     Cervical: No cervical adenopathy.     Right cervical: No superficial, deep or posterior cervical adenopathy.    Left cervical: No superficial, deep or posterior cervical adenopathy.     Upper Body:     Right upper body: No supraclavicular or axillary adenopathy.     Left upper body: No supraclavicular or axillary adenopathy.  Neurological:     General: No focal deficit present.     Mental Status: She is alert and oriented to person, place, and time.  Psychiatric:        Mood and Affect: Mood normal.        Behavior: Behavior normal.   Breast Exam Chaperone: Kennith Gain, RN   LABS:   CBC    Component Value Date/Time   WBC 9.1 01/31/2023 1145   WBC 8.1 04/14/2019 1020   RBC 4.01 01/31/2023 1145   RBC 4.46 04/14/2019 1020   HGB 12.0 01/31/2023 1145   HCT 37.0 01/31/2023 1145   PLT 204 01/31/2023 1145   MCV 92 01/31/2023 1145   MCH 29.9 01/31/2023 1145   MCH 29.6 04/14/2019 1020   MCHC 32.4 01/31/2023 1145   MCHC 33.0 04/14/2019 1020   RDW 12.6 01/31/2023 1145   LYMPHSABS 5.4 (H) 12/12/2022 1033   MONOABS 375 11/20/2015 1020   EOSABS  0.2 12/12/2022 1033   BASOSABS 0.1 12/12/2022 1033    CMP    Component Value Date/Time   NA 143 06/27/2023 1155   K 4.3 06/27/2023 1155   CL 103 06/27/2023 1155   CO2 22 06/27/2023 1155   GLUCOSE 113 (H) 06/27/2023 1155   GLUCOSE 125 (H) 02/15/2020 0848   BUN 17 06/27/2023 1155   CREATININE 0.88 06/27/2023 1155   CREATININE 0.78 02/15/2020 0848   CALCIUM 10.1 06/27/2023 1155   PROT  7.6 06/27/2023 1155   ALBUMIN 4.1 06/27/2023 1155   AST 22 06/27/2023 1155   ALT 10 06/27/2023 1155   ALKPHOS 88 06/27/2023 1155   BILITOT 0.6 06/27/2023 1155   GFRNONAA 77 08/15/2020 0931   GFRNONAA 76 02/15/2020 0848   GFRAA 89 08/15/2020 0931   GFRAA 88 02/15/2020 0848     No results found for: "CEA1", "CEA" / No results found for: "CEA1", "CEA" No results found for: "PSA1" No results found for: "ZOX096" No results found for: "CAN125"  No results found for: "TOTALPROTELP", "ALBUMINELP", "A1GS", "A2GS", "BETS", "BETA2SER", "GAMS", "MSPIKE", "SPEI" No results found for: "TIBC", "FERRITIN", "IRONPCTSAT" No results found for: "LDH"   STUDIES:   [place] RT BREAST BX W LOC DEV 1ST LESION IMG BX SPEC US GUIDE Addendum Date: 09/16/2023 ADDENDUM REPORT: 09/16/2023 07:26 ADDENDUM: PATHOLOGY revealed: A. RIGHT BREAST, MASS @ 4:00, BIOPSY: Invasive poorly differentiated ductal adenocarcinoma with extensive necrosis, grade 3. Overall grade: Grade 3. Angiolymphatic invasion not identified. Tumor measures 13 mm in greatest linear extent. Pathology results are CONCORDANT with imaging findings, per Dr. Edwin Cap. Pathology results and recommendations were discussed with patient and son Everlean Alstrom) via telephone on 09/12/2023 by Randa Lynn RN. Patient reported biopsy site doing well with no adverse symptoms, and only slight tenderness at the site. Post biopsy care instructions were reviewed, questions were answered and my direct phone number was provided. Patient was instructed to call [facility] Mammography Department for any additional questions or concerns related to biopsy site. RECOMMENDATION: Surgical and oncological consultation. Request for surgical and oncological consultation was relayed to Ellin Mayhew RT at [facility] Mammography Department by Randa Lynn RN on 09/12/2023. Pathology results reported by Randa Lynn RN on 09/12/2023. Electronically Signed   By: Edwin Cap M.D.   On: 09/16/2023 07:26   Result Date: 09/16/2023 CLINICAL DATA:  78 year old female presents for ultrasound-guided core biopsy of a suspicious mass in the right breast 4. EXAM: ULTRASOUND GUIDED RIGHT BREAST CORE NEEDLE BIOPSY COMPARISON:  Previous exam(s). PROCEDURE: I met with the patient and we discussed the procedure of ultrasound-guided biopsy, including benefits and alternatives. We discussed the high likelihood of a successful procedure. We discussed the risks of the procedure, including infection, bleeding, tissue injury, clip migration, and inadequate sampling. Informed written consent was given. The usual time-out protocol was performed immediately prior to the procedure. Note that a pre biopsy ultrasound of the right axilla was performed. The previously identified possible abnormal lymph nodes in the right axilla could not reproduced and therefore axillary lymph node biopsy was deferred. Lesion quadrant: Lower inner Using sterile technique and 1% Lidocaine as local anesthetic, under direct ultrasound visualization, a 14 gauge spring-loaded device was used to perform biopsy of the mass in the right breast at the 4 o'clock position using a inferolateral 2 superomedial approach. At the conclusion of the procedure a ribbon shaped tissue marker clip was deployed into the biopsy cavity. Follow up 2 view mammogram was performed and dictated separately. IMPRESSION: 1. Ultrasound guided biopsy of the mass in the left breast at the 4 o'clock position. No apparent complications. 2. No  morphologically abnormal lymph nodes identified sonographically in the right axilla, and therefore axillary lymph node biopsy was not performed. Electronically Signed: By: Edwin Cap M.D. On: 09/09/2023 13:38   MM CLIP PLACEMENT RIGHT Result Date: 09/09/2023 CLINICAL DATA:  Post ultrasound-guided core biopsy of a suspicious mass in the right breast at the 4 o'clock position.  EXAM: 3D DIAGNOSTIC RIGHT MAMMOGRAM POST ULTRASOUND BIOPSY COMPARISON:  Previous exam(s). FINDINGS: 3D Mammographic images were obtained following ultrasound-guided core biopsy of a mass in the right breast at the 4 o'clock position. A ribbon shaped biopsy marking clip is present at the site of the biopsied mass in the right breast at the 4 o'clock position. IMPRESSION: Ribbon shaped biopsy marking clip at site of biopsied mass in the right breast at the 4 o'clock position. Final Assessment: Post Procedure Mammograms for Marker Placement Electronically Signed   By: Edwin Cap M.D.   On: 09/09/2023 13:38   MM 3D DIAGNOSTIC MAMMOGRAM UNILATERAL RIGHT BREAST Result Date: 08/26/2023 CLINICAL DATA:  78 year old female presents for further evaluation of RIGHT breast mass identified on screening mammogram. EXAM: DIGITAL DIAGNOSTIC UNILATERAL RIGHT MAMMOGRAM WITH TOMOSYNTHESIS AND CAD; ULTRASOUND RIGHT BREAST LIMITED TECHNIQUE: Right digital diagnostic mammography and breast tomosynthesis was performed. The images were evaluated with computer-aided detection. ; Targeted ultrasound examination of the right breast was performed COMPARISON:  Previous exam(s). ACR Breast Density Category b: There are scattered areas of fibroglandular density. FINDINGS: Spot compression views of the RIGHT breast demonstrate a persistent partially circumscribed partially irregular mass within the middle to posterior depth INNER RIGHT breast. Targeted ultrasound is performed, showing a 1.8 x 1.3 x 1.4 cm hypoechoic mass at the 4 o'clock position of the RIGHT  breast 7-9 cm from the nipple. A 0.5 cm RIGHT axillary lymph node which appears to have lost its fatty hilum and another RIGHT axillary lymph node with UPPER limits normal cortical thickness of 3.7 mm are identified. IMPRESSION: 1. 1.8 cm suspicious mass within the LOWER OUTER RIGHT breast. Tissue sampling is recommended. 2. Two slightly abnormal appearing RIGHT axillary lymph nodes. Tissue sampling of 1 of these lymph nodes is recommended. RECOMMENDATION: Ultrasound-guided biopsy of LOWER INNER RIGHT breast mass and 1 of the above-mentioned RIGHT axillary lymph nodes, which will be scheduled. I have discussed the findings and recommendations with the patient. If applicable, a reminder letter will be sent to the patient regarding the next appointment. BI-RADS CATEGORY  4: Suspicious. Electronically Signed   By: Harmon Pier M.D.   On: 08/26/2023 15:22   [place] LIMITED ULTRASOUND INCLUDING AXILLA RIGHT BREAST Result Date: 08/26/2023 CLINICAL DATA:  78 year old female presents for further evaluation of RIGHT breast mass identified on screening mammogram. EXAM: DIGITAL DIAGNOSTIC UNILATERAL RIGHT MAMMOGRAM WITH TOMOSYNTHESIS AND CAD; ULTRASOUND RIGHT BREAST LIMITED TECHNIQUE: Right digital diagnostic mammography and breast tomosynthesis was performed. The images were evaluated with computer-aided detection. ; Targeted ultrasound examination of the right breast was performed COMPARISON:  Previous exam(s). ACR Breast Density Category b: There are scattered areas of fibroglandular density. FINDINGS: Spot compression views of the RIGHT breast demonstrate a persistent partially circumscribed partially irregular mass within the middle to posterior depth INNER RIGHT breast. Targeted ultrasound is performed, showing a 1.8 x 1.3 x 1.4 cm hypoechoic mass at the 4 o'clock position of the RIGHT breast 7-9 cm from the nipple. A 0.5 cm RIGHT axillary lymph node which appears to have lost its fatty hilum and another RIGHT axillary lymph  node with UPPER limits normal cortical thickness of 3.7 mm are identified. IMPRESSION: 1. 1.8 cm suspicious mass within the LOWER OUTER RIGHT breast. Tissue sampling is recommended. 2. Two slightly abnormal appearing RIGHT axillary lymph nodes. Tissue sampling of 1 of these lymph nodes is recommended. RECOMMENDATION: Ultrasound-guided biopsy of LOWER INNER RIGHT breast mass and 1 of the above-mentioned RIGHT axillary lymph nodes, which will  be scheduled. I have discussed the findings and recommendations with the patient. If applicable, a reminder letter will be sent to the patient regarding the next appointment. BI-RADS CATEGORY  4: Suspicious. Electronically Signed   By: Harmon Pier M.D.   On: 08/26/2023 15:22

## 2023-09-16 ENCOUNTER — Inpatient Hospital Stay: Payer: Medicare Other | Attending: Hematology | Admitting: Hematology

## 2023-09-16 ENCOUNTER — Inpatient Hospital Stay: Payer: Medicare Other

## 2023-09-16 ENCOUNTER — Other Ambulatory Visit: Payer: Self-pay | Admitting: *Deleted

## 2023-09-16 ENCOUNTER — Telehealth: Payer: Self-pay | Admitting: Genetic Counselor

## 2023-09-16 VITALS — BP 149/90 | HR 82 | Temp 96.7°F | Resp 20 | Ht 61.02 in | Wt 186.1 lb

## 2023-09-16 DIAGNOSIS — C50311 Malignant neoplasm of lower-inner quadrant of right female breast: Secondary | ICD-10-CM

## 2023-09-16 DIAGNOSIS — N6310 Unspecified lump in the right breast, unspecified quadrant: Secondary | ICD-10-CM

## 2023-09-16 DIAGNOSIS — Z1732 Human epidermal growth factor receptor 2 negative status: Secondary | ICD-10-CM | POA: Insufficient documentation

## 2023-09-16 DIAGNOSIS — Z8042 Family history of malignant neoplasm of prostate: Secondary | ICD-10-CM | POA: Insufficient documentation

## 2023-09-16 DIAGNOSIS — Z803 Family history of malignant neoplasm of breast: Secondary | ICD-10-CM | POA: Insufficient documentation

## 2023-09-16 DIAGNOSIS — Z171 Estrogen receptor negative status [ER-]: Secondary | ICD-10-CM | POA: Diagnosis not present

## 2023-09-16 DIAGNOSIS — Z1722 Progesterone receptor negative status: Secondary | ICD-10-CM | POA: Insufficient documentation

## 2023-09-16 DIAGNOSIS — Z800 Family history of malignant neoplasm of digestive organs: Secondary | ICD-10-CM | POA: Diagnosis not present

## 2023-09-16 LAB — COMPREHENSIVE METABOLIC PANEL
ALT: 12 U/L (ref 0–44)
AST: 19 U/L (ref 15–41)
Albumin: 3.4 g/dL — ABNORMAL LOW (ref 3.5–5.0)
Alkaline Phosphatase: 58 U/L (ref 38–126)
Anion gap: 11 (ref 5–15)
BUN: 23 mg/dL (ref 8–23)
CO2: 27 mmol/L (ref 22–32)
Calcium: 9.4 mg/dL (ref 8.9–10.3)
Chloride: 102 mmol/L (ref 98–111)
Creatinine, Ser: 0.8 mg/dL (ref 0.44–1.00)
GFR, Estimated: >60 mL/min (ref >60)
Glucose, Bld: 158 mg/dL — ABNORMAL HIGH (ref 70–99)
Potassium: 4.1 mmol/L (ref 3.5–5.1)
Sodium: 140 mmol/L (ref 135–145)
Total Bilirubin: 0.9 mg/dL (ref 0.0–1.2)
Total Protein: 7.2 g/dL (ref 6.5–8.1)

## 2023-09-16 LAB — CBC WITH DIFFERENTIAL/PLATELET
Abs Immature Granulocytes: 0.04 10*3/uL (ref 0.00–0.07)
Basophils Absolute: 0.1 10*3/uL (ref 0.0–0.1)
Basophils Relative: 1 %
Eosinophils Absolute: 0.2 10*3/uL (ref 0.0–0.5)
Eosinophils Relative: 2 %
HCT: 36.1 % (ref 36.0–46.0)
Hemoglobin: 11.3 g/dL — ABNORMAL LOW (ref 12.0–15.0)
Immature Granulocytes: 0 %
Lymphocytes Relative: 27 %
Lymphs Abs: 2.5 10*3/uL (ref 0.7–4.0)
MCH: 29.2 pg (ref 26.0–34.0)
MCHC: 31.3 g/dL (ref 30.0–36.0)
MCV: 93.3 fL (ref 80.0–100.0)
Monocytes Absolute: 0.6 10*3/uL (ref 0.1–1.0)
Monocytes Relative: 7 %
Neutro Abs: 5.8 10*3/uL (ref 1.7–7.7)
Neutrophils Relative %: 63 %
Platelets: 243 10*3/uL (ref 150–400)
RBC: 3.87 MIL/uL (ref 3.87–5.11)
RDW: 15.2 % (ref 11.5–15.5)
WBC: 9.3 10*3/uL (ref 4.0–10.5)
nRBC: 0 % (ref 0.0–0.2)

## 2023-09-16 LAB — GENETIC SCREENING ORDER

## 2023-09-16 NOTE — Telephone Encounter (Signed)
 Received message from Dr. Marice Potter team regarding request for POC genetic testing.  TRF submitted for [facility] CancerNext-Expanded +RNAinsight Panel.

## 2023-09-16 NOTE — Patient Instructions (Addendum)
 [facility]  Discharge Instructions  You were seen and examined today by Dr. Ellin Saba. Dr. Ellin Saba is a medical oncologist, meaning that he specializes in the treatment of cancer diagnoses. Dr. Ellin Saba discussed your past medical history, family history of cancers, and the events that led to you being here today.  You were referred to Dr. 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 This is a fairly aggressive type of breast cancer that does require treatment.  Dr. Ellin Saba has recommended a PET scan. A PET scan is a specialized CT scan that illuminates where there is cancer present in the body. This will accurately identify the stage of the cancer by telling us if there is any spread of the cancer to the lymph nodes or other organs.  Dr. Ellin Saba will also do additional labs today for further evaluation.  Please proceed with your surgery consult as scheduled.  Follow-up with Dr. Ellin Saba following your PET scan.  Thank you for choosing [facility] to provide your oncology and hematology care.   To afford each patient quality time with our provider, please arrive at least 15 minutes before your scheduled appointment time. You may need to reschedule your appointment if you arrive late (10 or more minutes). Arriving late affects you and other patients whose appointments are after yours.  Also, if you miss three or more appointments without notifying the office, you may be dismissed from the clinic at the provider's discretion.    Again, thank you for choosing [facility]  Our hope is that these requests will decrease the amount of time that you wait before being seen by our physicians.   If you have a lab appointment with the Cancer Center - please note that after April 8th, all labs will be drawn in the cancer center.  You do not have to check in or register with the main entrance as you have  in the past but will complete your check-in at the cancer center.            _____________________________________________________________  Should you have questions after your visit to [facility], please contact our office at [phone] and follow the prompts.  Our office hours are 8:00 a.m. to 4:30 p.m. Monday - Thursday and 8:00 a.m. to 2:30 p.m. Friday.  Please note that voicemails left after 4:00 p.m. may not be returned until the following business day.  We are closed weekends and all major holidays.  You do have access to a nurse 24-7, just call the main number to the clinic [phone] and do not press any options, hold on the line and a nurse will answer the phone.    For prescription refill requests, have your pharmacy contact our office and allow 72 hours.    Masks are no longer required in the cancer centers. If you would like for your care team to wear a mask while they are taking care of you, please let them know. You may have one support person who is at least 76 years old accompany you for your appointments.

## 2023-09-16 NOTE — Telephone Encounter (Signed)
 [email or web address]

## 2023-09-17 ENCOUNTER — Encounter: Payer: Self-pay | Admitting: Family Medicine

## 2023-09-17 LAB — CANCER ANTIGEN 27.29: CA 27.29: 32.3 U/mL (ref 0.0–38.6)

## 2023-09-18 LAB — CANCER ANTIGEN 15-3: CA 15-3: 20.9 U/mL (ref 0.0–25.0)

## 2023-09-25 ENCOUNTER — Ambulatory Visit: Payer: Medicare Other | Admitting: Surgery

## 2023-09-25 ENCOUNTER — Encounter (HOSPITAL_COMMUNITY)
Admission: RE | Admit: 2023-09-25 | Discharge: 2023-09-25 | Disposition: A | Discharge status: Home / Self Care | Payer: Medicare Other | Source: Ambulatory Visit | Attending: Hematology | Admitting: Hematology

## 2023-09-25 DIAGNOSIS — C50311 Malignant neoplasm of lower-inner quadrant of right female breast: Secondary | ICD-10-CM | POA: Diagnosis not present

## 2023-09-25 DIAGNOSIS — Z171 Estrogen receptor negative status [ER-]: Secondary | ICD-10-CM | POA: Diagnosis not present

## 2023-09-25 DIAGNOSIS — C50111 Malignant neoplasm of central portion of right female breast: Secondary | ICD-10-CM | POA: Diagnosis not present

## 2023-09-25 MED ORDER — FLUDEOXYGLUCOSE F - 18 (FDG) INJECTION
9.3100 | Freq: Once | INTRAVENOUS | Status: AC | PRN
  Administered 2023-09-25: 9.31 via INTRAVENOUS

## 2023-10-01 ENCOUNTER — Encounter: Payer: Self-pay | Admitting: Surgery

## 2023-10-01 ENCOUNTER — Ambulatory Visit: Admitting: Surgery

## 2023-10-01 ENCOUNTER — Other Ambulatory Visit (HOSPITAL_COMMUNITY): Payer: Self-pay | Admitting: Surgery

## 2023-10-01 ENCOUNTER — Ambulatory Visit: Payer: Medicare Other | Admitting: Internal Medicine

## 2023-10-01 VITALS — BP 133/76 | HR 79 | Temp 98.2°F | Resp 14 | Ht 61.0 in | Wt 188.0 lb

## 2023-10-01 DIAGNOSIS — C50911 Malignant neoplasm of unspecified site of right female breast: Secondary | ICD-10-CM

## 2023-10-01 DIAGNOSIS — R928 Other abnormal and inconclusive findings on diagnostic imaging of breast: Secondary | ICD-10-CM

## 2023-10-01 NOTE — Progress Notes (Unsigned)
 [facility] History and Physical  Reason for Referral:*** Referring Physician: ***  Chief Complaint   New Patient (Initial Visit)     [name] is a 78 y.o. female.  HPI: ***.  The *** started *** and has had a duration of ***.  It is associated with ***.  The *** is improved with ***, and is made worse with ***.    Quality*** Context***  Past Medical History:  Diagnosis Date   Asthma    Back pain    Depression    Diverticulosis of colon    GERD (gastroesophageal reflux disease)    Hyperlipidemia    Hypertension    Obesity    Osteoarthritis of knees, bilateral     Past Surgical History:  Procedure Laterality Date   ABDOMINAL HYSTERECTOMY     BREAST BIOPSY Right 09/09/2023   [place] RT BREAST BX W LOC DEV 1ST LESION IMG BX SPEC US GUIDE 09/09/2023 Edwin Cap, MD [facility]-ULTRASOUND   COLONOSCOPY  07/23/2003   Dr. Jerolyn Shin Smith:numerous large scattered diverticula   COLONOSCOPY N/A 11/15/2013   Dr. Darrick Penna: moderate diverticula, small internal hemorrhoids, redudant colon. Next TCS in 2025 with overtube.    ESOPHAGOGASTRODUODENOSCOPY (EGD) WITH ESOPHAGEAL DILATION N/A 11/15/2013   Dr. 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 moderate erosive gastritis, negative H.pylori   MULTIPLE TOOTH EXTRACTIONS  2024   TOTAL KNEE ARTHROPLASTY  06/01/2004   left / Dr. Romeo Apple   TOTAL KNEE ARTHROPLASTY  11/29/2003   right / Dr. Romeo Apple   TUBAL LIGATION      Family History  Problem Relation Age of Onset   Diabetes Mother    Hypertension Mother    Heart disease Mother    Aneurysm Father    Diabetes Father    Hypertension Brother    Diabetes Brother    Diabetes Brother    Hypertension Brother    Hypertension Son    Breast cancer Maternal Aunt    Colon cancer Maternal Uncle     Social History   Tobacco Use   Smoking status: Never    Passive exposure: Never   Smokeless tobacco: Never  Vaping Use   Vaping status: Never Used  Substance Use Topics    Alcohol use: No   Drug use: No    Medications: {medication reviewed/display:3041432} Allergies as of 10/01/2023   No Known Allergies      Medication List        Accurate as of October 01, 2023  1:58 PM. If you have any questions, ask your nurse or doctor.          albuterol 108 (90 Base) MCG/ACT inhaler Commonly known as: VENTOLIN HFA Inhale 2 puffs into the lungs every 6 (six) hours as needed for wheezing or shortness of breath.   amLODipine 10 MG tablet Commonly known as: NORVASC Take 1 tablet (10 mg total) by mouth daily.   aspirin EC 81 MG tablet Take 81 mg by mouth daily. Swallow whole.   atorvastatin 20 MG tablet Commonly known as: LIPITOR Take 1 tablet (20 mg total) by mouth daily.   benzonatate 100 MG capsule Commonly known as: TESSALON Take 1 capsule (100 mg total) by mouth 2 (two) times daily as needed for cough.   BIOTIN PO Take by mouth.   clonazePAM 0.5 MG tablet Commonly known as: KLONOPIN Take half tablet by mouth at bedtime, as needed, for sleep   cloNIDine 0.2 MG tablet Commonly known as: CATAPRES Take 0.2 mg by mouth  daily.   clotrimazole-betamethasone cream Commonly known as: LOTRISONE Apply cream twice daily to rash in groin for 10 days, then as needed   famotidine 10 MG tablet Commonly known as: PEPCID Take 10 mg by mouth 2 (two) times daily.   furosemide 40 MG tablet Commonly known as: LASIX TAKE 1 TABLET BY MOUTH DAILY AS NEEDED FOR EDEMA OR FLUID.   gabapentin 100 MG capsule Commonly known as: NEURONTIN Take 1 capsule (100 mg total) by mouth at bedtime.   gabapentin 300 MG capsule Commonly known as: NEURONTIN TAKE 1 CAPSULE BY MOUTH AT  BEDTIME   Gerhardt's butt cream Crea Apply 1 application topically 3 (three) times daily.   losartan-hydrochlorothiazide 100-25 MG tablet Commonly known as: HYZAAR Take 1 tablet by mouth daily.   magnesium 30 MG tablet Take one tablet by mouth once daily   Melatonin 3 MG Caps Take  1 capsule (3 mg total) by mouth at bedtime.   metoprolol succinate 50 MG 24 hr tablet Commonly known as: TOPROL-XL TAKE 1 TABLET BY MOUTH  DAILY WITH OR IMMEDIATELY  FOLLOWING A MEAL   olopatadine 0.1 % ophthalmic solution Commonly known as: PATANOL Place 1 drop into both eyes 2 (two) times daily.   omeprazole 20 MG capsule Commonly known as: PRILOSEC Take 1 capsule (20 mg total) by mouth daily.   potassium chloride SA 20 MEQ tablet Commonly known as: KLOR-CON M Take 1 tablet (20 mEq total) by mouth daily.   pyridOXINE 100 MG tablet Commonly known as: VITAMIN B6 Take 200 mg by mouth daily.   sertraline 50 MG tablet Commonly known as: ZOLOFT Take 1 tablet (50 mg total) by mouth daily.   temazepam 15 MG capsule Commonly known as: RESTORIL Take 1 capsule (15 mg total) by mouth at bedtime as needed for sleep.   Vitamin D3 125 MCG (5000 UT) Caps Take 1 capsule (5,000 Units total) by mouth daily.         ROS:  {Review of Systems:30496}  Blood pressure 133/76, pulse 79, temperature 98.2 F (36.8 C), temperature source Oral, resp. rate 14, height 5\' 1"  (1.549 m), weight 188 lb (85.3 kg), SpO2 95%. Physical Exam  Results: No results found for this or any previous visit (from the past 48 hours).  No results found.   Assessment & Plan:  [name] is a 78 y.o. female with *** -*** -*** -Follow up ***  All questions were answered to the satisfaction of the patient and family***.  The risk and benefits of *** were discussed including but not limited to ***.  After careful consideration, [name] has decided to ***.    Otilio Groleau A Allona Gondek 10/01/2023, 1:58 PM   Note: Portions of this report may have been transcribed using voice recognition software. Every effort has been made to ensure accuracy; however, inadvertent computerized transcription errors may still be present.   Theophilus Kinds, DO [facility] [address]  [place], [place] [address] [phone] (office)

## 2023-10-02 ENCOUNTER — Inpatient Hospital Stay: Payer: Medicare Other | Attending: Hematology | Admitting: Hematology

## 2023-10-02 ENCOUNTER — Telehealth: Payer: Self-pay | Admitting: *Deleted

## 2023-10-02 VITALS — BP 137/76 | HR 72 | Temp 98.4°F | Resp 16 | Wt 184.3 lb

## 2023-10-02 DIAGNOSIS — C50311 Malignant neoplasm of lower-inner quadrant of right female breast: Secondary | ICD-10-CM | POA: Diagnosis not present

## 2023-10-02 DIAGNOSIS — Z171 Estrogen receptor negative status [ER-]: Secondary | ICD-10-CM | POA: Insufficient documentation

## 2023-10-02 NOTE — Progress Notes (Signed)
 [name] [address], [place] [address]   Clinic Day:  10/02/2023  Referring physician: Kerri Perches, MD  Patient Care Team: Kerri Perches, MD as PCP - General Diona Browner Illene Bolus, MD as PCP - Cardiology (Cardiology) [place] Bali, MD (Inactive) as Consulting Physician (Gastroenterology) Doreatha Massed, MD as Medical Oncologist (Medical Oncology) Therese Sarah, RN as Oncology Nurse Navigator (Medical Oncology)   ASSESSMENT & PLAN:   Assessment:  1.  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  0.5 cm right axillary lymph node which appears to have lost its fatty hilum and another right axillary lymph node with upper limits of normal cortical thickness 3.7 mm. - Right breast mass biopsy (09/09/2023): Invasive poorly differentiated ductal adenocarcinoma, grade 3, ER/PR negative, HER2 (0+), Ki 67: 30%.  Lymph node could not be biopsied as it was not visible on ultrasound. - PET scan (09/25/2023): Mild metabolic activity in the medial right breast with no evidence of metastatic disease.  2. Social/Family History: -Lives at home with her granddaughter. Retired from [employer]. No tobacco use. No ETOH consumption. No chemical exposures.  -2 Maternal aunts had breast cancer. Maternal uncle had prostate cancer. Maternal niece had breast cancer.   Plan:  1.  Clinical stage II (T1c N 0/1) TNBC of the right breast LIQ: - We reviewed results of PET scan which did not show any metastatic disease. - Tumor markers and LFTs are also within normal limits. - She may proceed with tag placement on 10/14/2023.  At that time they will also re-look for any positive lymph nodes on ultrasound and biopsy them.  She will have  lumpectomy and SLNB on 10/22/2023. - We have discussed role of adjuvant chemotherapy after surgery.  She will also have a port placed at the time of lumpectomy. - RTC 4 weeks after surgery.   No orders of the defined types were placed in this encounter.     Alben Deeds Teague,acting as a Neurosurgeon for Doreatha Massed, MD.,have documented all relevant documentation on the behalf of Doreatha Massed, MD,as directed by  Doreatha Massed, MD while in the presence of Doreatha Massed, MD.  I, Doreatha Massed MD, have reviewed the above documentation for accuracy and completeness, and I agree with the above.    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   Cancer Staging  Breast cancer of lower-inner quadrant of right female breast [facility]) Staging form: Breast, AJCC 8th Edition - Clinical stage from 09/16/2023: Stage IIB (cT1c, cN1, cM0, G3, ER-, PR-, HER2-) - Unsigned    Prior Therapy: None  Current Therapy: Under workup   HISTORY OF PRESENT ILLNESS:   Oncology History   No history exists.      [name] is a 78 y.o. female presenting to clinic today for evaluation of triple negative right breast at the request of Kerri Perches, MD.  Patient underwent bilateral breast MM on 07/07/23 that found: In the right breast, a mass warrants further evaluation. In the left breast, no findings suspicious for malignancy. Right breast diagnostic MM and [place] was done on 08/26/23 that showed: 1.8 cm suspicious mass within the lower inner RIGHT breast. Two slightly  abnormal appearing RIGHT axillary lymph nodes.  [name] then had a biopsy of the 4 o'clock mass in the right breast on 09/09/23 that revealed: Invasive poorly differentiated ductal adenocarcinoma with extensive necrosis, grade 3. The mass was ER-/PR-/Her2- (0+).  Today, she states that she is doing well overall. Her appetite level is at  100%. Her energy level is at 50%. She is accompanied by her brother and her son and daughter on the phone.   [name] had menarche around age 55 and menopause in her early 76's. She was given some sort of medication for a year to help with hot flashes. She is unaware if she has taken hormone replacements. Delena took birth control for 2-3 years. She has 2 children with her first childbirth at age 67.   [name] denies any unintentional weight loss, new onset pains, or peripheral neuropathy. She has no prior history of breast biopsy. She has heart flutters, but denies any MI's, TIA's, or CVA's. Back pain is chronic.   Maretta reports intermittent ankle swellings, she attributes to 2 total knee arthroplasties in 2005.   INTERVAL HISTORY:   [name] is a 78 y.o. female presenting to the clinic today for follow-up of triple negative right breast cancer. She was last seen by me on 09/16/23 in consultation.  Since her last visit, she was seen by Dr. Robyne Peers on 10/01/23 and has a right breast lumpectomy scheduled on 10/22/23.   [name] had an initial PET on 09/25/23.   Today, she states that she is doing well overall. Her appetite level is at 100%. Her energy level is at 50%. She is accompanied by a family member.   PAST MEDICAL HISTORY:   Past Medical History: Past Medical History:  Diagnosis Date   Asthma    Back pain    Depression    Diverticulosis of colon    GERD (gastroesophageal reflux disease)    Hyperlipidemia    Hypertension    Obesity    Osteoarthritis of knees, bilateral     Surgical History: Past Surgical History:  Procedure Laterality Date   ABDOMINAL HYSTERECTOMY     BREAST BIOPSY Right 09/09/2023   [place] RT BREAST BX W LOC DEV 1ST LESION IMG BX SPEC US GUIDE 09/09/2023 Edwin Cap, MD AP-ULTRASOUND   COLONOSCOPY  07/23/2003   Dr. Jerolyn Shin Smith:numerous large scattered diverticula   COLONOSCOPY N/A 11/15/2013   Dr. Darrick Penna: moderate diverticula, small internal hemorrhoids,  redudant colon. Next TCS in 2025 with overtube.    ESOPHAGOGASTRODUODENOSCOPY (EGD) WITH ESOPHAGEAL DILATION N/A 11/15/2013   Dr. 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 moderate erosive gastritis, negative H.pylori   MULTIPLE TOOTH EXTRACTIONS  2024   TOTAL KNEE ARTHROPLASTY  06/01/2004   left / Dr. Romeo Apple   TOTAL KNEE ARTHROPLASTY  11/29/2003   right / Dr. Romeo Apple   TUBAL LIGATION      Social History: Social History   Socioeconomic History   Marital status: Married    Spouse name: Gabriela Irigoyen   Number of children: 2   Years of education: 12   Highest education level: 12th grade  Occupational History   Occupation: retired     Associate Professor: UNEMPLOYED  Tobacco Use   Smoking status: Never    Passive exposure: Never   Smokeless tobacco: Never  Vaping Use   Vaping status: Never Used  Substance and Sexual Activity   Alcohol use: No   Drug use: No   Sexual activity: Not Currently    Partners: Male  Birth control/protection: Surgical  Other Topics Concern   Not on file  Social History Narrative   Not on file   Social Drivers of Health   Financial Resource Strain: Low Risk  (08/12/2023)   Overall Financial Resource Strain (CARDIA)    Difficulty of Paying Living Expenses: Not hard at all  Food Insecurity: No Food Insecurity (08/12/2023)   Hunger Vital Sign    Worried About Running Out of Food in the Last Year: Never true    Ran Out of Food in the Last Year: Never true  Transportation Needs: No Transportation Needs (08/12/2023)   PRAPARE - Administrator, Civil Service (Medical): No    Lack of Transportation (Non-Medical): No  Physical Activity: Sufficiently Active (08/12/2023)   Exercise Vital Sign    Days of Exercise per Week: 5 days    Minutes of Exercise per Session: 50 min  Stress: No Stress Concern Present (08/12/2023)   [employer] of Occupational Health - Occupational Stress Questionnaire    Feeling of Stress : Only a little  Social  Connections: Moderately Integrated (08/12/2023)   Social Connection and Isolation Panel [NHANES]    Frequency of Communication with Friends and Family: More than three times a week    Frequency of Social Gatherings with Friends and Family: More than three times a week    Attends Religious Services: More than 4 times per year    Active Member of [employer] or Organizations: Yes    Attends Banker Meetings: More than 4 times per year    Marital Status: Widowed  Intimate Partner Violence: Not At Risk (08/12/2023)   Humiliation, Afraid, Rape, and Kick questionnaire    Fear of Current or Ex-Partner: No    Emotionally Abused: No    Physically Abused: No    Sexually Abused: No    Family History: Family History  Problem Relation Age of Onset   Diabetes Mother    Hypertension Mother    Heart disease Mother    Aneurysm Father    Diabetes Father    Hypertension Brother    Diabetes Brother    Diabetes Brother    Hypertension Brother    Hypertension Son    Breast cancer Maternal Aunt    Colon cancer Maternal Uncle     Current Medications:  Current Outpatient Medications:    albuterol (VENTOLIN HFA) 108 (90 Base) MCG/ACT inhaler, Inhale 2 puffs into the lungs every 6 (six) hours as needed for wheezing or shortness of breath., Disp: 8.5 each, Rfl: 1   amLODipine (NORVASC) 10 MG tablet, Take 1 tablet (10 mg total) by mouth daily., Disp: 90 tablet, Rfl: 1   aspirin EC 81 MG tablet, Take 81 mg by mouth daily. Swallow whole., Disp: , Rfl:    atorvastatin (LIPITOR) 20 MG tablet, Take 1 tablet (20 mg total) by mouth daily., Disp: 90 tablet, Rfl: 3   benzonatate (TESSALON) 100 MG capsule, Take 1 capsule (100 mg total) by mouth 2 (two) times daily as needed for cough., Disp: 20 capsule, Rfl: 0   BIOTIN PO, Take by mouth., Disp: , Rfl:    Cholecalciferol (VITAMIN D3) 125 MCG (5000 UT) CAPS, Take 1 capsule (5,000 Units total) by mouth daily., Disp: 30 capsule, Rfl: 11   clonazePAM (KLONOPIN)  0.5 MG tablet, Take half tablet by mouth at bedtime, as needed, for sleep, Disp: 20 tablet, Rfl: 0   cloNIDine (CATAPRES) 0.2 MG tablet, Take 0.2 mg by mouth daily., Disp: , Rfl:  clotrimazole-betamethasone (LOTRISONE) cream, Apply cream twice daily to rash in groin for 10 days, then as needed, Disp: 45 g, Rfl: 1   famotidine (PEPCID) 10 MG tablet, Take 10 mg by mouth 2 (two) times daily., Disp: , Rfl:    furosemide (LASIX) 40 MG tablet, TAKE 1 TABLET BY MOUTH DAILY AS NEEDED FOR EDEMA OR FLUID., Disp: 30 tablet, Rfl: 2   gabapentin (NEURONTIN) 100 MG capsule, Take 1 capsule (100 mg total) by mouth at bedtime., Disp: 30 capsule, Rfl: 3   gabapentin (NEURONTIN) 300 MG capsule, TAKE 1 CAPSULE BY MOUTH AT  BEDTIME, Disp: 100 capsule, Rfl: 2   Hydrocortisone (GERHARDT'S BUTT CREAM) CREA, Apply 1 application topically 3 (three) times daily., Disp: 1 each, Rfl: 1   losartan-hydrochlorothiazide (HYZAAR) 100-25 MG tablet, Take 1 tablet by mouth daily., Disp: 30 tablet, Rfl: 5   magnesium 30 MG tablet, Take one tablet by mouth once daily, Disp: 30 tablet, Rfl: 2   Melatonin 3 MG CAPS, Take 1 capsule (3 mg total) by mouth at bedtime., Disp: 30 capsule, Rfl: 3   metoprolol succinate (TOPROL-XL) 50 MG 24 hr tablet, TAKE 1 TABLET BY MOUTH  DAILY WITH OR IMMEDIATELY  FOLLOWING A MEAL, Disp: 100 tablet, Rfl: 2   olopatadine (PATANOL) 0.1 % ophthalmic solution, Place 1 drop into both eyes 2 (two) times daily., Disp: 5 mL, Rfl: 6   omeprazole (PRILOSEC) 20 MG capsule, Take 1 capsule (20 mg total) by mouth daily., Disp: 100 capsule, Rfl: 2   potassium chloride SA (KLOR-CON M) 20 MEQ tablet, Take 1 tablet (20 mEq total) by mouth daily., Disp: 100 tablet, Rfl: 2   pyridOXINE (VITAMIN B-6) 100 MG tablet, Take 200 mg by mouth daily., Disp: , Rfl:    sertraline (ZOLOFT) 50 MG tablet, Take 1 tablet (50 mg total) by mouth daily., Disp: 30 tablet, Rfl: 3   temazepam (RESTORIL) 15 MG capsule, Take 1 capsule (15 mg total) by  mouth at bedtime as needed for sleep., Disp: 30 capsule, Rfl: 2   Allergies: No Known Allergies  REVIEW OF SYSTEMS:   Review of Systems  Constitutional:  Negative for chills, fatigue and fever.  HENT:   Negative for lump/mass, mouth sores, nosebleeds, sore throat and trouble swallowing.   Eyes:  Negative for eye problems.  Respiratory:  Positive for shortness of breath. Negative for cough.   Cardiovascular:  Negative for chest pain, leg swelling and palpitations.  Gastrointestinal:  Negative for abdominal pain, constipation, diarrhea, nausea and vomiting.  Genitourinary:  Negative for bladder incontinence, difficulty urinating, dysuria, frequency, hematuria and nocturia.   Musculoskeletal:  Negative for arthralgias, back pain, flank pain, myalgias and neck pain.  Skin:  Negative for itching and rash.  Neurological:  Positive for dizziness. Negative for headaches and numbness.  Hematological:  Does not bruise/bleed easily.  Psychiatric/Behavioral:  Positive for sleep disturbance. Negative for depression and suicidal ideas. The patient is not nervous/anxious.   All other systems reviewed and are negative.    VITALS:   Blood pressure 137/76, pulse 72, temperature 98.4 F (36.9 C), temperature source Oral, resp. rate 16, weight 184 lb 4.9 oz (83.6 kg), SpO2 97%.  Wt Readings from Last 3 Encounters:  10/02/23 184 lb 4.9 oz (83.6 kg)  10/01/23 188 lb (85.3 kg)  09/16/23 186 lb 1.1 oz (84.4 kg)    Body mass index is 34.82 kg/m.  Performance status (ECOG): 1 - Symptomatic but completely ambulatory  PHYSICAL EXAM:   Physical Exam Vitals and  nursing note reviewed. Exam conducted with a chaperone present.  Constitutional:      Appearance: Normal appearance.  Cardiovascular:     Rate and Rhythm: Normal rate and regular rhythm.     Pulses: Normal pulses.     Heart sounds: Normal heart sounds.  Pulmonary:     Effort: Pulmonary effort is normal.     Breath sounds: Normal breath  sounds.  Abdominal:     Palpations: Abdomen is soft. There is no hepatomegaly, splenomegaly or mass.     Tenderness: There is no abdominal tenderness.  Musculoskeletal:     Right lower leg: No edema.     Left lower leg: No edema.  Lymphadenopathy:     Cervical: No cervical adenopathy.     Right cervical: No superficial, deep or posterior cervical adenopathy.    Left cervical: No superficial, deep or posterior cervical adenopathy.     Upper Body:     Right upper body: No supraclavicular or axillary adenopathy.     Left upper body: No supraclavicular or axillary adenopathy.  Neurological:     General: No focal deficit present.     Mental Status: She is alert and oriented to person, place, and time.  Psychiatric:        Mood and Affect: Mood normal.        Behavior: Behavior normal.   Breast Exam Chaperone: Kennith Gain, RN   LABS:   CBC    Component Value Date/Time   WBC 9.3 09/16/2023 1357   RBC 3.87 09/16/2023 1357   HGB 11.3 (L) 09/16/2023 1357   HGB 12.0 01/31/2023 1145   HCT 36.1 09/16/2023 1357   HCT 37.0 01/31/2023 1145   PLT 243 09/16/2023 1357   PLT 204 01/31/2023 1145   MCV 93.3 09/16/2023 1357   MCV 92 01/31/2023 1145   MCH 29.2 09/16/2023 1357   MCHC 31.3 09/16/2023 1357   RDW 15.2 09/16/2023 1357   RDW 12.6 01/31/2023 1145   LYMPHSABS 2.5 09/16/2023 1357   LYMPHSABS 5.4 (H) 12/12/2022 1033   MONOABS 0.6 09/16/2023 1357   EOSABS 0.2 09/16/2023 1357   EOSABS 0.2 12/12/2022 1033   BASOSABS 0.1 09/16/2023 1357   BASOSABS 0.1 12/12/2022 1033    CMP    Component Value Date/Time   NA 140 09/16/2023 1357   NA 143 06/27/2023 1155   K 4.1 09/16/2023 1357   CL 102 09/16/2023 1357   CO2 27 09/16/2023 1357   GLUCOSE 158 (H) 09/16/2023 1357   BUN 23 09/16/2023 1357   BUN 17 06/27/2023 1155   CREATININE 0.80 09/16/2023 1357   CREATININE 0.78 02/15/2020 0848   CALCIUM 9.4 09/16/2023 1357   PROT 7.2 09/16/2023 1357   PROT 7.6 06/27/2023 1155   ALBUMIN  3.4 (L) 09/16/2023 1357   ALBUMIN 4.1 06/27/2023 1155   AST 19 09/16/2023 1357   ALT 12 09/16/2023 1357   ALKPHOS 58 09/16/2023 1357   BILITOT 0.9 09/16/2023 1357   BILITOT 0.6 06/27/2023 1155   GFRNONAA >60 09/16/2023 1357   GFRNONAA 76 02/15/2020 0848   GFRAA 89 08/15/2020 0931   GFRAA 88 02/15/2020 0848     No results found for: "CEA1", "CEA" / No results found for: "CEA1", "CEA" No results found for: "PSA1" No results found for: "ZOX096" No results found for: "CAN125"  No results found for: "TOTALPROTELP", "ALBUMINELP", "A1GS", "A2GS", "BETS", "BETA2SER", "GAMS", "MSPIKE", "SPEI" No results found for: "TIBC", "FERRITIN", "IRONPCTSAT" No results found for: "LDH"   STUDIES:  NM PET Image Initial (PI) Skull Base To Thigh Result Date: 10/02/2023 CLINICAL DATA:  Initial treatment strategy for invasive breast carcinoma. EXAM: NUCLEAR MEDICINE PET SKULL BASE TO THIGH TECHNIQUE: 9.3 mCi F-18 FDG was injected intravenously. Full-ring PET imaging was performed from the skull base to thigh after the radiotracer. CT data was obtained and used for attenuation correction and anatomic localization. Fasting blood glucose: 111 mg/dl COMPARISON:  None Available. FINDINGS: Mediastinal blood pool activity: SUV max 2.2 Liver activity: SUV max NA NECK: No hypermetabolic lymph nodes in the neck. Incidental CT findings: None. CHEST: Mild metabolic activity associated with the medial RIGHT breast mass with SUV max equal 2.4. Mass measures 14 mm on image 66 has a central biopsy clip. No hypermetabolic RIGHT axillary adenopathy. No enlarged nodes by CT. No central thoracic hypermetabolic adenopathy or enlarged nodes. No suspicious pulmonary nodules. Incidental CT findings: Mitral valve calcification ABDOMEN/PELVIS: No abnormal hypermetabolic activity within the liver, pancreas, adrenal glands, or spleen. No hypermetabolic lymph nodes in the abdomen or pelvis. Incidental CT findings: Multiple diverticula of the  descending colon and sigmoid colon without acute inflammation. Atherosclerotic calcification of the aorta. SKELETON: No focal hypermetabolic activity to suggest skeletal metastasis. Incidental CT findings: None. IMPRESSION: 1. Mild metabolic activity associated with the medial RIGHT breast mass consistent with primary breast carcinoma. 2. No evidence of metastatic adenopathy in the RIGHT axilla or central thorax. 3. No evidence distant metastatic disease. 4.  Aortic Atherosclerosis (ICD10-I70.0). Electronically Signed   By: Genevive Bi M.D.   On: 10/02/2023 12:34   [place] RT BREAST BX W LOC DEV 1ST LESION IMG BX SPEC US GUIDE Addendum Date: 09/16/2023 ADDENDUM REPORT: 09/16/2023 07:26 ADDENDUM: PATHOLOGY revealed: A. RIGHT BREAST, MASS @ 4:00, BIOPSY: Invasive poorly differentiated ductal adenocarcinoma with extensive necrosis, grade 3. Overall grade: Grade 3. Angiolymphatic invasion not identified. Tumor measures 13 mm in greatest linear extent. Pathology results are CONCORDANT with imaging findings, per Dr. Edwin Cap. Pathology results and recommendations were discussed with patient and son Everlean Alstrom) via telephone on 09/12/2023 by Randa Lynn RN. Patient reported biopsy site doing well with no adverse symptoms, and only slight tenderness at the site. Post biopsy care instructions were reviewed, questions were answered and my direct phone number was provided. Patient was instructed to call [facility] Mammography Department for any additional questions or concerns related to biopsy site. RECOMMENDATION: Surgical and oncological consultation. Request for surgical and oncological consultation was relayed to Ellin Mayhew RT at [facility] Mammography Department by Randa Lynn RN on 09/12/2023. Pathology results reported by Randa Lynn RN on 09/12/2023. Electronically Signed   By: Edwin Cap M.D.   On: 09/16/2023 07:26   Result Date: 09/16/2023 CLINICAL DATA:  78 year old  female presents for ultrasound-guided core biopsy of a suspicious mass in the right breast 4. EXAM: ULTRASOUND GUIDED RIGHT BREAST CORE NEEDLE BIOPSY COMPARISON:  Previous exam(s). PROCEDURE: I met with the patient and we discussed the procedure of ultrasound-guided biopsy, including benefits and alternatives. We discussed the high likelihood of a successful procedure. We discussed the risks of the procedure, including infection, bleeding, tissue injury, clip migration, and inadequate sampling. Informed written consent was given. The usual time-out protocol was performed immediately prior to the procedure. Note that a pre biopsy ultrasound of the right axilla was performed. The previously identified possible abnormal lymph nodes in the right axilla could not reproduced and therefore axillary lymph node biopsy was deferred. Lesion quadrant: Lower inner Using sterile technique  and 1% Lidocaine as local anesthetic, under direct ultrasound visualization, a 14 gauge spring-loaded device was used to perform biopsy of the mass in the right breast at the 4 o'clock position using a inferolateral 2 superomedial approach. At the conclusion of the procedure a ribbon shaped tissue marker clip was deployed into the biopsy cavity. Follow up 2 view mammogram was performed and dictated separately. IMPRESSION: 1. Ultrasound guided biopsy of the mass in the left breast at the 4 o'clock position. No apparent complications. 2. No morphologically abnormal lymph nodes identified sonographically in the right axilla, and therefore axillary lymph node biopsy was not performed. Electronically Signed: By: Edwin Cap M.D. On: 09/09/2023 13:38   MM CLIP PLACEMENT RIGHT Result Date: 09/09/2023 CLINICAL DATA:  Post ultrasound-guided core biopsy of a suspicious mass in the right breast at the 4 o'clock position. EXAM: 3D DIAGNOSTIC RIGHT MAMMOGRAM POST ULTRASOUND BIOPSY COMPARISON:  Previous exam(s). FINDINGS: 3D Mammographic images were  obtained following ultrasound-guided core biopsy of a mass in the right breast at the 4 o'clock position. A ribbon shaped biopsy marking clip is present at the site of the biopsied mass in the right breast at the 4 o'clock position. IMPRESSION: Ribbon shaped biopsy marking clip at site of biopsied mass in the right breast at the 4 o'clock position. Final Assessment: Post Procedure Mammograms for Marker Placement Electronically Signed   By: Edwin Cap M.D.   On: 09/09/2023 13:38

## 2023-10-02 NOTE — Patient Instructions (Addendum)
 [facility] at [facility] Discharge Instructions   You were seen and examined today by Dr. Ellin Saba.  He reviewed the results of your lab work which are normal/stable.   He reviewed the results of your PET scan which did not show any spread of cancer.   We will plan to start chemotherapy after your surgery.   We will proceed with your treatment today.   Return as scheduled.    Thank you for choosing [facility] at [facility] to provide your oncology and hematology care.  To afford each patient quality time with our provider, please arrive at least 15 minutes before your scheduled appointment time.   If you have a lab appointment with the Cancer Center please come in thru the Main Entrance and check in at the main information desk.  You need to re-schedule your appointment should you arrive 10 or more minutes late.  We strive to give you quality time with our providers, and arriving late affects you and other patients whose appointments are after yours.  Also, if you no show three or more times for appointments you may be dismissed from the clinic at the providers discretion.     Again, thank you for choosing [facility].  Our hope is that these requests will decrease the amount of time that you wait before being seen by our physicians.       _____________________________________________________________  Should you have questions after your visit to [facility], please contact our office at [phone] and follow the prompts.  Our office hours are 8:00 a.m. and 4:30 p.m. Monday - Friday.  Please note that voicemails left after 4:00 p.m. may not be returned until the following business day.  We are closed weekends and major holidays.  You do have access to a nurse 24-7, just call the main number to the clinic [phone] and do not press any options, hold on the line and a nurse will answer the phone.    For  prescription refill requests, have your pharmacy contact our office and allow 72 hours.    Due to Covid, you will need to wear a mask upon entering the hospital. If you do not have a mask, a mask will be given to you at the Main Entrance upon arrival. For doctor visits, patients may have 1 support person age 23 or older with them. For treatment visits, patients can not have anyone with them due to social distancing guidelines and our immunocompromised population.

## 2023-10-02 NOTE — Telephone Encounter (Signed)
 Received VM from patient [phone]~ telephone.   Patient reports burning with urination and constipation. Requested treatment information.   Call placed to patient, but her telephone is still out of order. Call placed to patient son, Everlean Alstrom [phone] telephone.   Patient brought into conversation via three way telephone call???  Patient reports xz1 day of burning with urination. Also states that she has not had a BM in 2 days.   Advised to contact PCP for appointment for UA. Advised that she can use OTC stool softeners or Miralax to aid her BM's.  Patient verbalized understanding.

## 2023-10-03 ENCOUNTER — Telehealth: Payer: Self-pay | Admitting: *Deleted

## 2023-10-03 ENCOUNTER — Encounter: Payer: Self-pay | Admitting: Family Medicine

## 2023-10-03 ENCOUNTER — Other Ambulatory Visit: Payer: Self-pay | Admitting: Family Medicine

## 2023-10-03 NOTE — H&P (Signed)
 [facility] History and Physical  Reason for Referral: Right breast invasive ductal carcinoma, triple negative Referring Physician: Dr. Lodema Hong  Chief Complaint   New Patient (Initial Visit)     [name] is a 78 y.o. female.  HPI: Patient presents for evaluation of her right breast adenocarcinoma.  The patient has no history of any masses, lumps, bumps, nipple changes or discharge. She had menarche at age 37-16, and her first pregnancy at age 53-22. She is G2P2. She did not breastfeed her children.  She has a history of breast cancer in 3 maternal aunts and a maternal cousin.  She underwent menopause at after her hysterectomy which occurred sometime in her 60s.  She has never had any previous biopsies or concerning areas on mammogram.  She has not had any chest radiation.  She normally gets annual mammograms.  On her diagnostic mammogram, there were 2 slightly abnormal appearing right axillary lymph nodes.  There was plan to biopsy the lymph nodes at the time of her right breast biopsy, however there were no abnormal lymph nodes at the time of biopsy, so no lymph nodes were biopsied.  She has a past medical history significant for hypertension, hyperlipidemia, GERD, and anxiety/depression.  She takes an 81 mg aspirin daily.  Her surgical history is significant for tubal ligation, hysterectomy, and knee replacement.  She denies use of tobacco products, alcohol, and illicit drugs.   Past Medical History:  Diagnosis Date   Asthma    Back pain    Depression    Diverticulosis of colon    GERD (gastroesophageal reflux disease)    Hyperlipidemia    Hypertension    Obesity    Osteoarthritis of knees, bilateral     Past Surgical History:  Procedure Laterality Date   ABDOMINAL HYSTERECTOMY     BREAST BIOPSY Right 09/09/2023   [place] RT BREAST BX W LOC DEV 1ST LESION IMG BX SPEC US GUIDE 09/09/2023 Edwin Cap, MD [facility]-ULTRASOUND   COLONOSCOPY  07/23/2003   Dr. Jerolyn Shin  Smith:numerous large scattered diverticula   COLONOSCOPY N/A 11/15/2013   Dr. Darrick Penna: moderate diverticula, small internal hemorrhoids, redudant colon. Next TCS in 2025 with overtube.    ESOPHAGOGASTRODUODENOSCOPY (EGD) WITH ESOPHAGEAL DILATION N/A 11/15/2013   Dr. 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 moderate erosive gastritis, negative H.pylori   MULTIPLE TOOTH EXTRACTIONS  2024   TOTAL KNEE ARTHROPLASTY  06/01/2004   left / Dr. Romeo Apple   TOTAL KNEE ARTHROPLASTY  11/29/2003   right / Dr. Romeo Apple   TUBAL LIGATION      Family History  Problem Relation Age of Onset   Diabetes Mother    Hypertension Mother    Heart disease Mother    Aneurysm Father    Diabetes Father    Hypertension Brother    Diabetes Brother    Diabetes Brother    Hypertension Brother    Hypertension Son    Breast cancer Maternal Aunt    Colon cancer Maternal Uncle     Social History   Tobacco Use   Smoking status: Never    Passive exposure: Never   Smokeless tobacco: Never  Vaping Use   Vaping status: Never Used  Substance Use Topics   Alcohol use: No   Drug use: No    Medications: I have reviewed the patient's current medications. Allergies as of 10/01/2023   No Known Allergies      Medication List        Accurate as of  October 01, 2023  1:58 PM. If you have any questions, ask your nurse or doctor.          albuterol 108 (90 Base) MCG/ACT inhaler Commonly known as: VENTOLIN HFA Inhale 2 puffs into the lungs every 6 (six) hours as needed for wheezing or shortness of breath.   amLODipine 10 MG tablet Commonly known as: NORVASC Take 1 tablet (10 mg total) by mouth daily.   aspirin EC 81 MG tablet Take 81 mg by mouth daily. Swallow whole.   atorvastatin 20 MG tablet Commonly known as: LIPITOR Take 1 tablet (20 mg total) by mouth daily.   benzonatate 100 MG capsule Commonly known as: TESSALON Take 1 capsule (100 mg total) by mouth 2 (two) times daily as needed for  cough.   BIOTIN PO Take by mouth.   clonazePAM 0.5 MG tablet Commonly known as: KLONOPIN Take half tablet by mouth at bedtime, as needed, for sleep   cloNIDine 0.2 MG tablet Commonly known as: CATAPRES Take 0.2 mg by mouth daily.   clotrimazole-betamethasone cream Commonly known as: LOTRISONE Apply cream twice daily to rash in groin for 10 days, then as needed   famotidine 10 MG tablet Commonly known as: PEPCID Take 10 mg by mouth 2 (two) times daily.   furosemide 40 MG tablet Commonly known as: LASIX TAKE 1 TABLET BY MOUTH DAILY AS NEEDED FOR EDEMA OR FLUID.   gabapentin 100 MG capsule Commonly known as: NEURONTIN Take 1 capsule (100 mg total) by mouth at bedtime.   gabapentin 300 MG capsule Commonly known as: NEURONTIN TAKE 1 CAPSULE BY MOUTH AT  BEDTIME   Gerhardt's butt cream Crea Apply 1 application topically 3 (three) times daily.   losartan-hydrochlorothiazide 100-25 MG tablet Commonly known as: HYZAAR Take 1 tablet by mouth daily.   magnesium 30 MG tablet Take one tablet by mouth once daily   Melatonin 3 MG Caps Take 1 capsule (3 mg total) by mouth at bedtime.   metoprolol succinate 50 MG 24 hr tablet Commonly known as: TOPROL-XL TAKE 1 TABLET BY MOUTH  DAILY WITH OR IMMEDIATELY  FOLLOWING A MEAL   olopatadine 0.1 % ophthalmic solution Commonly known as: PATANOL Place 1 drop into both eyes 2 (two) times daily.   omeprazole 20 MG capsule Commonly known as: PRILOSEC Take 1 capsule (20 mg total) by mouth daily.   potassium chloride SA 20 MEQ tablet Commonly known as: KLOR-CON M Take 1 tablet (20 mEq total) by mouth daily.   pyridOXINE 100 MG tablet Commonly known as: VITAMIN B6 Take 200 mg by mouth daily.   sertraline 50 MG tablet Commonly known as: ZOLOFT Take 1 tablet (50 mg total) by mouth daily.   temazepam 15 MG capsule Commonly known as: RESTORIL Take 1 capsule (15 mg total) by mouth at bedtime as needed for sleep.   Vitamin D3 125  MCG (5000 UT) Caps Take 1 capsule (5,000 Units total) by mouth daily.         ROS:  Constitutional: negative for chills, fatigue, and fevers Eyes: negative for visual disturbance and pain Ears, nose, mouth, throat, and face: negative for ear drainage, sore throat, and sinus problems Respiratory: negative for cough, wheezing, and shortness of breath Cardiovascular: negative for chest pain and palpitations Gastrointestinal: negative for abdominal pain, nausea, reflux symptoms, and vomiting Genitourinary:negative for dysuria and frequency Integument/breast: positive for dryness, negative for rash Hematologic/lymphatic: negative for bleeding and lymphadenopathy Musculoskeletal:positive for back pain, negative for neck pain Neurological: negative for  dizziness and tremors Endocrine: negative for temperature intolerance  Blood pressure 133/76, pulse 79, temperature 98.2 F (36.8 C), temperature source Oral, resp. rate 14, height 5\' 1"  (1.549 m), weight 188 lb (85.3 kg), SpO2 95%. Physical Exam Vitals reviewed.  Constitutional:      Appearance: Normal appearance.  HENT:     Head: Normocephalic and atraumatic.  Eyes:     Extraocular Movements: Extraocular movements intact.     Pupils: Pupils are equal, round, and reactive to light.  Cardiovascular:     Rate and Rhythm: Normal rate and regular rhythm.  Pulmonary:     Effort: Pulmonary effort is normal.     Breath sounds: Normal breath sounds.  Chest:  Breasts:    Right: Mass present. No swelling, bleeding, inverted nipple, nipple discharge, skin change or tenderness.     Left: No swelling, bleeding, inverted nipple, mass, nipple discharge, skin change or tenderness.     Comments: Right breast with a palpable fullness/mass in the 4 o'clock position, no nipple discharge, tenderness, or abnormal skin changes; no palpable axillary lymphadenopathy Abdominal:     General: There is no distension.     Palpations: Abdomen is soft.      Tenderness: There is no abdominal tenderness.  Musculoskeletal:        General: Normal range of motion.     Cervical back: Normal range of motion.  Lymphadenopathy:     Upper Body:     Right upper body: No supraclavicular or axillary adenopathy.     Left upper body: No supraclavicular or axillary adenopathy.  Skin:    General: Skin is warm and dry.  Neurological:     General: No focal deficit present.     Mental Status: She is alert and oriented to person, place, and time.  Psychiatric:        Mood and Affect: Mood normal.        Behavior: Behavior normal.     Results: Bilateral screening mammogram (07/07/2023): IMPRESSION: Further evaluation is suggested for a mass in the right breast.   RECOMMENDATION: Diagnostic mammogram and ultrasound of the right breast. (Code:[id number])   The patient will be contacted regarding the findings, and additional imaging will be scheduled.   BI-RADS CATEGORY  0: Incomplete: Need additional imaging evaluation.  Right breast diagnostic mammogram (08/26/23): IMPRESSION: 1. 1.8 cm suspicious mass within the LOWER OUTER RIGHT breast. Tissue sampling is recommended. 2. Two slightly abnormal appearing RIGHT axillary lymph nodes. Tissue sampling of 1 of these lymph nodes is recommended.   RECOMMENDATION: Ultrasound-guided biopsy of LOWER INNER RIGHT breast mass and 1 of the above-mentioned RIGHT axillary lymph nodes, which will be scheduled.   I have discussed the findings and recommendations with the patient. If applicable, a reminder letter will be sent to the patient regarding the next appointment.   BI-RADS CATEGORY  4: Suspicious.  Right breast and axillary ultrasound (08/26/23): IMPRESSION: 1. 1.8 cm suspicious mass within the LOWER OUTER RIGHT breast. Tissue sampling is recommended. 2. Two slightly abnormal appearing RIGHT axillary lymph nodes. Tissue sampling of 1 of these lymph nodes is recommended.    RECOMMENDATION: Ultrasound-guided biopsy of LOWER INNER RIGHT breast mass and 1 of the above-mentioned RIGHT axillary lymph nodes, which will be scheduled.   I have discussed the findings and recommendations with the patient. If applicable, a reminder letter will be sent to the patient regarding the next appointment.   BI-RADS CATEGORY  4: Suspicious.  Right breast ultrasound guided needle localization (09/09/2023):  IMPRESSION: 1. Ultrasound guided biopsy of the mass in the left breast at the 4 o'clock position. No apparent complications.   2. No morphologically abnormal lymph nodes identified sonographically in the right axilla, and therefore axillary lymph node biopsy was not performed.  ADDENDUM: PATHOLOGY revealed: A. RIGHT BREAST, MASS @ 4:00, BIOPSY: Invasive poorly differentiated ductal adenocarcinoma with extensive necrosis, grade 3. Overall grade: Grade 3. Angiolymphatic invasion not identified. Tumor measures 13 mm in greatest linear extent.   Pathology results are CONCORDANT with imaging findings, per Dr. Edwin Cap.   Pathology results and recommendations were discussed with patient and [name]) via telephone on 09/12/2023 by Randa Lynn RN. Patient reported biopsy site doing well with no adverse symptoms, and only slight tenderness at the site. Post biopsy care instructions were reviewed, questions were answered and my direct phone number was provided. Patient was instructed to call [facility] Mammography Department for any additional questions or concerns related to biopsy site.   RECOMMENDATION: Surgical and oncological consultation. Request for surgical and oncological consultation was relayed to Ellin Mayhew RT at [facility] Mammography Department by Randa Lynn RN on 09/12/2023.  PET scan (09/25/2023): IMPRESSION: 1. Mild metabolic activity associated with the medial RIGHT breast mass consistent with primary  breast carcinoma. 2. No evidence of metastatic adenopathy in the RIGHT axilla or central thorax. 3. No evidence distant metastatic disease. 4.  Aortic Atherosclerosis (ICD10-I70.0).  Assessment & Plan:  HAYDEE JABBOUR is a 78 y.o. female who presents for evaluation of a right breast cancer.  -We have discussed the options for surgery including the option of mastectomy with sentinel node biopsy versus partial mastectomy (lumpectomy) with sentinel node biopsy. We have discussed that there is no difference in the prognosis or chance or recurrence or differences in survival between the two options. We have discussed the need for radiation with the lumpectomy, and we have discussed that she would potentially need chemotherapy after surgery. -We have discussed that if she decides to have a lumpectomy that we will need to get a tag placed into the area where the biopsy was performed. We have also discussed the need for injection of radiotracer to perform the sentinel node biopsy. -We have discussed that the sentinel node biopsy tells [place] if the cancer has spread to the lymph nodes and can help with plans for chemotherapy treatment and overall prognosis.   -We have discussed that if the lumpectomy does not remove the entire cancer that she may have to have an additional procedure, and we have discussed that a positive sentinel node can require further removal of lymph nodes from the axilla but that recent research does not show any improvement in disease free survival and carries greater risk for lymphedema.   -We have discussed that these are big discussions, and that the risk from the operations are similar including risk of bleeding, risk of infection, and risk of needing additional surgeries.  -Patient has decided to proceed with right breast lumpectomy and sentinel lymph node biopsy -She is scheduled for radiofrequency tag placement on 3/25.  At that time, Dr. Derinda Late will evaluate for any axillary  lymphadenopathy and potentially biopsy or place a tag if there is something abnormal -Patient tentatively scheduled for surgery on 4/2 -Per oncology notes, they are requesting Port-A-Cath insertion at the time of breast surgery.  Will discuss the full details of Port-A-Cath insertion in the morning of her surgery -Information provided to the patient regarding breast cancer,  lumpectomy, and sentinel lymph node biopsy  All questions were answered to the satisfaction of the patient and family.  Note: Portions of this report may have been transcribed using voice recognition software. Every effort has been made to ensure accuracy; however, inadvertent computerized transcription errors may still be present.   Theophilus Kinds, DO [facility] [address] [place], [place] [address] [phone] (office)

## 2023-10-03 NOTE — Telephone Encounter (Signed)
 Patient seen by oncology on 10/02/2023.   Oncology requesting port to be placed during upcoming surgery.   Dr. Robyne Peers agreeable. Procedure added to current case.   Call placed to patient and patient son Everlean Alstrom to make aware. Verbalized understanding.

## 2023-10-06 ENCOUNTER — Other Ambulatory Visit (HOSPITAL_COMMUNITY): Payer: Self-pay | Admitting: Surgery

## 2023-10-06 DIAGNOSIS — R928 Other abnormal and inconclusive findings on diagnostic imaging of breast: Secondary | ICD-10-CM

## 2023-10-06 DIAGNOSIS — C50911 Malignant neoplasm of unspecified site of right female breast: Secondary | ICD-10-CM

## 2023-10-06 NOTE — Telephone Encounter (Signed)
 Called patient only wants to see Dr Lodema Hong, added for next week, will call patient sooner any cancellations

## 2023-10-08 ENCOUNTER — Ambulatory Visit: Payer: Medicare Other | Admitting: Pulmonary Disease

## 2023-10-08 ENCOUNTER — Encounter: Payer: Self-pay | Admitting: Pulmonary Disease

## 2023-10-08 VITALS — BP 158/78 | HR 80 | Temp 97.6°F | Ht 61.0 in | Wt 185.6 lb

## 2023-10-08 DIAGNOSIS — G4734 Idiopathic sleep related nonobstructive alveolar hypoventilation: Secondary | ICD-10-CM

## 2023-10-08 DIAGNOSIS — R0683 Snoring: Secondary | ICD-10-CM

## 2023-10-08 NOTE — Progress Notes (Signed)
 [name]    [id number]    1946-06-15  Primary Care Physician:Simpson, Milus Mallick, MD  Referring Physician: Nyoka Cowden, MD [address] [place],  [place] [address]  Chief complaint:   Patient being seen for concern for sleep disordered breathing  HPI:  History of snoring, nocturnal desaturations, has been on oxygen supplementation for many years Chronic shortness of breath  Uses 2 L of oxygen at night  Snoring, no choking or gasping at night She does have a dry mouth in the morning No morning headaches Memory is good  Usually gets enough sleep at night Wakes up rested  Not very sleepy during the day  Son and daughter have obstructive sleep apnea on treatment  Never smoked Worked for the tobacco company for 28 years  Activity limited by knee pain and tiredness Has had 2 knee replacements in the past  Recent diagnosis of breast cancer for which she is undergoing evaluation at present   Outpatient Encounter Medications as of 10/08/2023  Medication Sig   albuterol (VENTOLIN HFA) 108 (90 Base) MCG/ACT inhaler Inhale 2 puffs into the lungs every 6 (six) hours as needed for wheezing or shortness of breath.   amLODipine (NORVASC) 10 MG tablet Take 1 tablet (10 mg total) by mouth daily.   aspirin EC 81 MG tablet Take 81 mg by mouth daily. Swallow whole.   atorvastatin (LIPITOR) 20 MG tablet Take 1 tablet (20 mg total) by mouth daily.   BIOTIN PO Take by mouth.   Cholecalciferol (VITAMIN D3) 125 MCG (5000 UT) CAPS Take 1 capsule (5,000 Units total) by mouth daily.   clonazePAM (KLONOPIN) 0.5 MG tablet Take half tablet by mouth at bedtime, as needed, for sleep   cloNIDine (CATAPRES) 0.2 MG tablet Take 0.2 mg by mouth daily.   clotrimazole-betamethasone (LOTRISONE) cream Apply cream twice daily to rash in groin for 10 days, then as needed   famotidine (PEPCID) 10 MG tablet Take 10 mg by mouth 2 (two) times daily.   furosemide (LASIX) 40 MG tablet  TAKE 1 TABLET BY MOUTH DAILY AS NEEDED FOR EDEMA OR FLUID.   gabapentin (NEURONTIN) 100 MG capsule Take 1 capsule (100 mg total) by mouth at bedtime.   gabapentin (NEURONTIN) 300 MG capsule TAKE 1 CAPSULE BY MOUTH AT  BEDTIME   Hydrocortisone (GERHARDT'S BUTT CREAM) CREA Apply 1 application topically 3 (three) times daily.   losartan-hydrochlorothiazide (HYZAAR) 100-25 MG tablet Take 1 tablet by mouth daily.   magnesium 30 MG tablet Take one tablet by mouth once daily   Melatonin 3 MG CAPS Take 1 capsule (3 mg total) by mouth at bedtime.   metoprolol succinate (TOPROL-XL) 50 MG 24 hr tablet TAKE 1 TABLET BY MOUTH  DAILY WITH OR IMMEDIATELY  FOLLOWING A MEAL   olopatadine (PATANOL) 0.1 % ophthalmic solution Place 1 drop into both eyes 2 (two) times daily.   omeprazole (PRILOSEC) 20 MG capsule Take 1 capsule (20 mg total) by mouth daily.   potassium chloride SA (KLOR-CON M) 20 MEQ tablet Take 1 tablet (20 mEq total) by mouth daily.   pyridOXINE (VITAMIN B-6) 100 MG tablet Take 200 mg by mouth daily.   sertraline (ZOLOFT) 50 MG tablet Take 1 tablet (50 mg total) by mouth daily.   temazepam (RESTORIL) 15 MG capsule Take 1 capsule (15 mg total) by mouth at bedtime as needed for sleep.   [DISCONTINUED] benzonatate (TESSALON) 100 MG capsule Take 1  capsule (100 mg total) by mouth 2 (two) times daily as needed for cough. (Patient not taking: Reported on 10/08/2023)   No facility-administered encounter medications on file as of 10/08/2023.    Allergies as of 10/08/2023   (No Known Allergies)    Past Medical History:  Diagnosis Date   Asthma    Back pain    Depression    Diverticulosis of colon    GERD (gastroesophageal reflux disease)    Hyperlipidemia    Hypertension    Obesity    Osteoarthritis of knees, bilateral     Past Surgical History:  Procedure Laterality Date   ABDOMINAL HYSTERECTOMY     BREAST BIOPSY Right 09/09/2023   [place] RT BREAST BX W LOC DEV 1ST LESION IMG BX SPEC US GUIDE  09/09/2023 Mallory Cap, MD [facility]-ULTRASOUND   COLONOSCOPY  07/23/2003   Dr. Jerolyn Shin Smith:numerous large scattered diverticula   COLONOSCOPY N/A 11/15/2013   Dr. Darrick Penna: moderate diverticula, small internal hemorrhoids, redudant colon. Next TCS in 2025 with overtube.    ESOPHAGOGASTRODUODENOSCOPY (EGD) WITH ESOPHAGEAL DILATION N/A 11/15/2013   Dr. 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 moderate erosive gastritis, negative H.pylori   MULTIPLE TOOTH EXTRACTIONS  2024   TOTAL KNEE ARTHROPLASTY  06/01/2004   left / Dr. Romeo Apple   TOTAL KNEE ARTHROPLASTY  11/29/2003   right / Dr. Romeo Apple   TUBAL LIGATION      Family History  Problem Relation Age of Onset   Diabetes Mother    Hypertension Mother    Heart disease Mother    Aneurysm Father    Diabetes Father    Hypertension Brother    Diabetes Brother    Diabetes Brother    Hypertension Brother    Hypertension Son    Breast cancer Maternal Aunt    Colon cancer Maternal Uncle     Social History   Socioeconomic History   Marital status: Married    Spouse name: [name]   Number of children: 2   Years of education: 12   Highest education level: 12th grade  Occupational History   Occupation: retired     Associate Professor: UNEMPLOYED  Tobacco Use   Smoking status: Never    Passive exposure: Never   Smokeless tobacco: Never  Vaping Use   Vaping status: Never Used  Substance and Sexual Activity   Alcohol use: No   Drug use: No   Sexual activity: Not Currently    Partners: Male    Birth control/protection: Surgical  Other Topics Concern   Not on file  Social History Narrative   Not on file   Social Drivers of Health   Financial Resource Strain: Low Risk  (08/12/2023)   Overall Financial Resource Strain (CARDIA)    Difficulty of Paying Living Expenses: Not hard at all  Food Insecurity: No Food Insecurity (08/12/2023)   Hunger Vital Sign    Worried About Running Out of Food in the Last Year: Never true    Ran Out  of Food in the Last Year: Never true  Transportation Needs: No Transportation Needs (08/12/2023)   PRAPARE - Administrator, Civil Service (Medical): No    Lack of Transportation (Non-Medical): No  Physical Activity: Sufficiently Active (08/12/2023)   Exercise Vital Sign    Days of Exercise per Week: 5 days    Minutes of Exercise per Session: 50 min  Stress: No Stress Concern Present (08/12/2023)   [employer] of Occupational Health - Occupational Stress Questionnaire  Feeling of Stress : Only a little  Social Connections: Moderately Integrated (08/12/2023)   Social Connection and Isolation Panel [NHANES]    Frequency of Communication with Friends and Family: More than three times a week    Frequency of Social Gatherings with Friends and Family: More than three times a week    Attends Religious Services: More than 4 times per year    Active Member of [employer] or Organizations: Yes    Attends Banker Meetings: More than 4 times per year    Marital Status: Widowed  Intimate Partner Violence: Not At Risk (08/12/2023)   Humiliation, Afraid, Rape, and Kick questionnaire    Fear of Current or Ex-Partner: No    Emotionally Abused: No    Physically Abused: No    Sexually Abused: No    Review of Systems  Respiratory:  Positive for shortness of breath.   Psychiatric/Behavioral:  Positive for sleep disturbance.     Vitals:   10/08/23 1440  BP: (!) 158/78  Pulse: 80  Temp: 97.6 F (36.4 C)  SpO2: 96%     Physical Exam Constitutional:      Appearance: She is obese.  HENT:     Head: Normocephalic.     Nose: No congestion.     Mouth/Throat:     Mouth: Mucous membranes are moist.  Eyes:     General: No scleral icterus. Cardiovascular:     Rate and Rhythm: Normal rate and regular rhythm.     Heart sounds: No murmur heard.    No friction rub.  Pulmonary:     Effort: No respiratory distress.     Breath sounds: No stridor. No wheezing or rhonchi.   Musculoskeletal:     Cervical back: No rigidity or tenderness.  Neurological:     Mental Status: She is alert.  Psychiatric:        Mood and Affect: Mood normal.    Epworth Sleepiness Scale of 1 Data Reviewed: Last chest x-ray on record was 01/31/2021-no acute infiltrate for my read  Had had a sleep study performed in 2014 07/06/2013 negative for significant sleep apnea, she was on 2 L of oxygen at the time as well  Assessment:  Snoring, nocturnal desaturations Concern for sleep disordered breathing Denies significant symptoms during the day  Will benefit from an in-lab sleep study to definitively ascertain that she does not have significant sleep disordered breathing   Encouraged to continue oxygen supplementation  Recent diagnosis of breast cancer for which she is undergoing evaluation and management decisions ongoing   Plan/Recommendations: Schedule for split-night study  Continue oxygen use at night  Continue inhaler use as needed  Tentative follow-up in about 4 to 5 months  Encouraged to call with significant concerns   Virl Diamond MD [facility] 10/08/2023, 2:49 PM  CC: Mallory Cowden, MD

## 2023-10-08 NOTE — Patient Instructions (Addendum)
 Schedule for in-lab split-night study  Continue using your oxygen as you are present  Good luck with your treatment  Call us with significant concerns  We can have the sleep study done as soon as convenient for you  Tentative follow-up in about 4 to 5 months

## 2023-10-09 ENCOUNTER — Ambulatory Visit: Admitting: Surgery

## 2023-10-10 ENCOUNTER — Encounter: Payer: Self-pay | Admitting: Genetic Counselor

## 2023-10-10 ENCOUNTER — Telehealth: Payer: Self-pay | Admitting: Genetic Counselor

## 2023-10-10 DIAGNOSIS — Z1379 Encounter for other screening for genetic and chromosomal anomalies: Secondary | ICD-10-CM | POA: Insufficient documentation

## 2023-10-10 NOTE — Telephone Encounter (Signed)
 Called patient in attempt to disclose BRCA2 positive result.  LVM requesting call back    Karcyn Menn M. Rennie Plowman, MS, [facility] Genetic Counselor (P) [phone]

## 2023-10-13 ENCOUNTER — Other Ambulatory Visit: Payer: Self-pay

## 2023-10-13 ENCOUNTER — Encounter: Payer: Self-pay | Admitting: Genetic Counselor

## 2023-10-13 ENCOUNTER — Telehealth: Payer: Self-pay | Admitting: Genetic Counselor

## 2023-10-13 DIAGNOSIS — Z1501 Genetic susceptibility to malignant neoplasm of breast: Secondary | ICD-10-CM

## 2023-10-13 DIAGNOSIS — Z171 Estrogen receptor negative status [ER-]: Secondary | ICD-10-CM

## 2023-10-13 HISTORY — DX: Genetic susceptibility to malignant neoplasm of breast: Z15.01

## 2023-10-13 NOTE — Telephone Encounter (Addendum)
 Disclosed BRCA2 positive result.  Ms. Laforte did not recall having genetic testing performed.  Reviewed purpose of testing and very briefly reviewed cancer risks and family implications.  Requested follow up appointment to discuss further.  Ms. Manso requested we call her son Everlean Alstrom "Pecola Leisure" as he has a better idea of her schedule and will be helping care for her after surgery.  She gave verbal permission to discuss results with Pecola Leisure.  Also reviewed with Ms. Twining that BRCA2 can increase risk for contralateral breast cancer.  Discussed that recent data (CARRIERS study) shows that in those above age 10, the chances of contralateral breast cancer for BRCA2 mutation carriers are similar to non-carriers. Ms. Stenner requested that her surgeons office call her to discuss their recommendations in light of these results.     Discussed above with son, Pecola Leisure.  Reviewed family implications.  Pecola Leisure interested in genetic testing.  Scheduled Ms. Laural Benes and [employer] for [employer] visit on 4/8.  Pecola Leisure also requested we call Ms. Tarr's daughter.  He is not in contact with her.  We discussed that we would need Ms. Overley's permission prior to calling.  Inbasket message sent to surgeon Dr. Robyne Peers requesting her team contact patient to discuss if results impacts their surgery recommendation.

## 2023-10-13 NOTE — Progress Notes (Signed)
 Message received that patient would like to transfer care to [facility] as she will be moving in with her son after surgery. Referral sent per request.

## 2023-10-14 ENCOUNTER — Encounter: Payer: Self-pay | Admitting: Family Medicine

## 2023-10-14 ENCOUNTER — Ambulatory Visit (INDEPENDENT_AMBULATORY_CARE_PROVIDER_SITE_OTHER): Admitting: Family Medicine

## 2023-10-14 ENCOUNTER — Ambulatory Visit (HOSPITAL_COMMUNITY)
Admission: RE | Admit: 2023-10-14 | Discharge: 2023-10-14 | Disposition: A | Discharge status: Home / Self Care | Source: Ambulatory Visit | Attending: Surgery | Admitting: Surgery

## 2023-10-14 VITALS — BP 106/66 | HR 66 | Ht 61.0 in | Wt 186.0 lb

## 2023-10-14 DIAGNOSIS — C50911 Malignant neoplasm of unspecified site of right female breast: Secondary | ICD-10-CM | POA: Insufficient documentation

## 2023-10-14 DIAGNOSIS — Z171 Estrogen receptor negative status [ER-]: Secondary | ICD-10-CM

## 2023-10-14 DIAGNOSIS — R928 Other abnormal and inconclusive findings on diagnostic imaging of breast: Secondary | ICD-10-CM

## 2023-10-14 DIAGNOSIS — R300 Dysuria: Secondary | ICD-10-CM | POA: Diagnosis not present

## 2023-10-14 DIAGNOSIS — F5105 Insomnia due to other mental disorder: Secondary | ICD-10-CM

## 2023-10-14 DIAGNOSIS — I10 Essential (primary) hypertension: Secondary | ICD-10-CM | POA: Diagnosis not present

## 2023-10-14 DIAGNOSIS — C50311 Malignant neoplasm of lower-inner quadrant of right female breast: Secondary | ICD-10-CM | POA: Diagnosis not present

## 2023-10-14 DIAGNOSIS — F409 Phobic anxiety disorder, unspecified: Secondary | ICD-10-CM

## 2023-10-14 MED ORDER — LIDOCAINE HCL (PF) 2 % IJ SOLN
10.0000 mL | Freq: Once | INTRAMUSCULAR | Status: AC
  Administered 2023-10-14: 10 mL

## 2023-10-14 MED ORDER — TEMAZEPAM 15 MG PO CAPS: 15.0000 mg | ORAL_CAPSULE | Freq: Every evening | ORAL | 5 refills | Status: DC | PRN

## 2023-10-14 MED ORDER — LIDOCAINE-EPINEPHRINE (PF) 1 %-1:200000 IJ SOLN
10.0000 mL | Freq: Once | INTRAMUSCULAR | Status: AC
  Administered 2023-10-14: 10 mL via INTRADERMAL

## 2023-10-14 MED ORDER — ALBUTEROL SULFATE HFA 108 (90 BASE) MCG/ACT IN AERS: 2.0000 | INHALATION_SPRAY | Freq: Four times a day (QID) | RESPIRATORY_TRACT | 1 refills | Status: DC | PRN

## 2023-10-14 MED ORDER — AMLODIPINE BESYLATE 10 MG PO TABS: 10.0000 mg | ORAL_TABLET | Freq: Every day | ORAL | 1 refills | Status: DC

## 2023-10-14 MED ORDER — LIDOCAINE HCL (PF) 2 % IJ SOLN
INTRAMUSCULAR | Status: AC
  Filled 2023-10-14: qty 10

## 2023-10-14 NOTE — Telephone Encounter (Signed)
 Followed up with [name] regarding plan for post-testing counseling for BRCA2 result.  Informed her of appt on 4/8 to review results.  Discussed that her surgeon Dr. Robyne Peers is [place] this week but she requested her colleague Dr. Lovell Sheehan call the patient.  We informed [name] to reach out to the surgeon's office if she does not hear from them.  Also discussed that [facility] requested we contact [name]'s daughter, [name] about this result.  Discussed with [name] that after her genetic counseling session on 4/8, she will have more tools to be able to discuss this result with her daughter, brothers, and other relatives.

## 2023-10-14 NOTE — Progress Notes (Signed)
 I called the patient about her genetic testing results.  I told the patient that even though she is BRCA2 positive, given her age she does not need to undergo prophylactic contralateral mastectomy.  Patient understands and agrees.  All her questions were answered.  She will follow-up with Dr. Ellin Saba of oncology should she have any further questions.

## 2023-10-14 NOTE — Assessment & Plan Note (Signed)
 2 week history, cCUAS and c/s if abn Encouraged to push fluids/ water and void often

## 2023-10-14 NOTE — Patient Instructions (Signed)
 F/u in 3 months, call if you need me sooner  Urine is sent for testing for infection, you will be notified with result  All the best with upcoming surgery  Thanks for choosing [facility], we consider it a privelige to serve you.

## 2023-10-15 ENCOUNTER — Encounter: Payer: Self-pay | Admitting: Family Medicine

## 2023-10-15 NOTE — Assessment & Plan Note (Signed)
 Controlled, no change in medication

## 2023-10-15 NOTE — Assessment & Plan Note (Signed)
 New dx 08/2023, has upcoming surgery and Oncology visits.

## 2023-10-15 NOTE — Assessment & Plan Note (Signed)
 Increased with new cancer diagnosis Sleep hygiene reviewed and written information offered also. Prescription sent for  medication needed.

## 2023-10-15 NOTE — Progress Notes (Signed)
   [name]     MRN: [id number]      DOB: 04/07/1946  Chief Complaint  Patient presents with   Acute Visit    UTI concerns  Was having burning with urination for the past two weeks, pt. Declines any itching ,or odor     HPI [name] is here for 2 week h/o urine symptoms, c/o burning and frequency to varying degrees, denies flank pain , fever or chills, does not report visible blood in urine Has upcoming breast surgery and wants this checked prior to surgery Increased anxiety and difficulty sleeping meds refilled, new dx of breast cancer  ROS Denies recent fever or chills. Denies sinus pressure, nasal congestion, ear pain or sore throat. Denies chest congestion, productive cough or wheezing. Denies chest pains, palpitations and leg swelling Denies abdominal pain, nausea, vomiting,diarrhea or constipation.   Denies dysuria, frequency, hesitancy or incontinence. Chronic  joint pain, and limitation in mobility. Denies headaches, seizures, numbness, or tingling.  Denies skin break down or rash.   PE  BP 106/66   Pulse 66   Ht 5\' 1"  (1.549 m)   Wt 186 lb (84.4 kg)   SpO2 94%   BMI 35.14 kg/m   Patient alert and oriented and in no cardiopulmonary distress.  HEENT: No facial asymmetry, EOMI,     Neck supple .  Chest: Clear to auscultation bilaterally.  CVS: S1, S2 no murmurs, no S3.Regular rate.  ABD: Soft no renal angle tenderness, mld suprapubic discomfort on palpation Ext: No edema  MS: decreased  ROM spine, shoulders, hips and knees.  Skin: Intact, no ulcerations or rash noted.  Psych: Good eye contact, normal affect. Memory intact not anxious or depressed appearing.  CNS: CN 2-12 intact, power,  normal throughout.no focal deficits noted.   Assessment & Plan  Dysuria 2 week history, cCUAS and c/s if abn Encouraged to push fluids/ water and void often  Essential hypertension Controlled, no change in medication   Breast cancer of lower-inner  quadrant of right female breast [facility]) New dx 08/2023, has upcoming surgery and Oncology visits.   Insomnia due to anxiety and fear Increased with new cancer diagnosis Sleep hygiene reviewed and written information offered also. Prescription sent for  medication needed.

## 2023-10-17 ENCOUNTER — Telehealth: Payer: Self-pay | Admitting: *Deleted

## 2023-10-17 NOTE — Telephone Encounter (Signed)
 Surgical Date: 10/22/2023 Procedure: Excisional Breast Biopsy, Right W/ Radiofrequency Localizer and Sentinel Lymph Node Biopsy, Right & Insertion Port- A- Cath, Left  Received call from patient son, Everlean Alstrom [phone]~ telephone.   Inquired as to level of care patient will require after surgery. Advised that patient may require increased assistance a day or so after surgery. Advised that patient will not be able to lift >10 lbs for at least 2 weeks following surgery.   Everlean Alstrom requested note for work for day of surgery and 2 additional days for care of patient.  Advised if additional time is needed, provider will need to approve.  Letter transcribed and patient son will print from MyChart.

## 2023-10-17 NOTE — Patient Instructions (Signed)
    [name]  10/17/2023     @[email or web address] @   Your procedure is scheduled on Wednesday, 4/2.  Report to [facility] at 0600 A.M.  Call this number if you have problems the morning of surgery:  [phone]  If you experience any cold or flu symptoms such as cough, fever, chills, shortness of breath, etc. between now and your scheduled surgery, please notify us at the above number.   Remember:  Do not eat or drink after midnight.  You may drink clear liquids until 0330 .  Clear liquids allowed are:                    Water, Juice (No red color; non-citric and without pulp; diabetics please choose diet or no sugar options), Carbonated beverages (diabetics please choose diet or no sugar options), Clear Tea (No creamer, milk, or cream, including half & half and powdered creamer), and Clear Sports drink (No red color; diabetics please choose diet or no sugar options)    Take these medicines the morning of surgery with A SIP OF WATER amlodipine, clonidine, pepcid, gabapentin, omeprazole, zoloft and metoprolol. Use your inhaler and bring it with you.    Do not wear jewelry, make-up or nail polish, including gel polish,  artificial nails, or any other type of covering on natural nails (fingers and  toes).  Do not wear lotions, powders, or perfumes, or deodorant.  Do not shave 48 hours prior to surgery.  Men may shave face and neck.  Do not bring valuables to the hospital.  [facility] is not responsible for any belongings or valuables.  Contacts, dentures or bridgework may not be worn into surgery.  Leave your suitcase in the car.  After surgery it may be brought to your room.  For patients admitted to the hospital, discharge time will be determined by your treatment team.  Patients discharged the day of surgery will not be allowed to drive home.   Name and phone number of your driver:   family   Please read over the following fact sheets that you were given. Coughing and Deep  Breathing, Anesthesia Post-op Instructions, and Care and Recovery After Surgery

## 2023-10-18 LAB — MICROSCOPIC EXAMINATION
Bacteria, UA: NONE SEEN
Casts: NONE SEEN /LPF

## 2023-10-18 LAB — UA/M W/RFLX CULTURE, ROUTINE
Bilirubin, UA: NEGATIVE
Glucose, UA: NEGATIVE
Ketones, UA: NEGATIVE
Nitrite, UA: NEGATIVE
Protein,UA: NEGATIVE
RBC, UA: NEGATIVE
Specific Gravity, UA: 1.022 (ref 1.005–1.030)
Urobilinogen, Ur: 1 mg/dL (ref 0.2–1.0)
pH, UA: 5.5 (ref 5.0–7.5)

## 2023-10-18 LAB — URINE CULTURE, REFLEX

## 2023-10-19 ENCOUNTER — Encounter: Payer: Self-pay | Admitting: Family Medicine

## 2023-10-20 ENCOUNTER — Encounter (HOSPITAL_COMMUNITY)
Admission: RE | Admit: 2023-10-20 | Discharge: 2023-10-20 | Disposition: A | Discharge status: Home / Self Care | Source: Ambulatory Visit | Attending: Surgery | Admitting: Surgery

## 2023-10-20 ENCOUNTER — Encounter (HOSPITAL_COMMUNITY): Payer: Self-pay

## 2023-10-20 VITALS — BP 106/66 | HR 66 | Temp 97.6°F | Resp 18 | Ht 61.0 in | Wt 186.0 lb

## 2023-10-20 DIAGNOSIS — Z01818 Encounter for other preprocedural examination: Secondary | ICD-10-CM | POA: Insufficient documentation

## 2023-10-20 DIAGNOSIS — I499 Cardiac arrhythmia, unspecified: Secondary | ICD-10-CM | POA: Insufficient documentation

## 2023-10-20 HISTORY — DX: Sleep apnea, unspecified: G47.30

## 2023-10-20 LAB — BASIC METABOLIC PANEL WITH GFR
Anion gap: 10 (ref 5–15)
BUN: 16 mg/dL (ref 8–23)
CO2: 27 mmol/L (ref 22–32)
Calcium: 9.4 mg/dL (ref 8.9–10.3)
Chloride: 103 mmol/L (ref 98–111)
Creatinine, Ser: 0.89 mg/dL (ref 0.44–1.00)
GFR, Estimated: >60 mL/min (ref >60)
Glucose, Bld: 103 mg/dL — ABNORMAL HIGH (ref 70–99)
Potassium: 4 mmol/L (ref 3.5–5.1)
Sodium: 140 mmol/L (ref 135–145)

## 2023-10-21 ENCOUNTER — Other Ambulatory Visit (HOSPITAL_COMMUNITY): Payer: Self-pay

## 2023-10-21 ENCOUNTER — Other Ambulatory Visit (HOSPITAL_COMMUNITY): Payer: Self-pay | Admitting: Surgery

## 2023-10-21 ENCOUNTER — Encounter: Payer: Self-pay | Admitting: Family Medicine

## 2023-10-21 ENCOUNTER — Other Ambulatory Visit: Payer: Self-pay | Admitting: Family Medicine

## 2023-10-21 DIAGNOSIS — R928 Other abnormal and inconclusive findings on diagnostic imaging of breast: Secondary | ICD-10-CM

## 2023-10-21 MED ORDER — CLONIDINE HCL 0.2 MG PO TABS
0.2000 mg | ORAL_TABLET | Freq: Every day | ORAL | 0 refills | Status: DC
  Filled 2023-10-21: qty 90, 90d supply, fill #0

## 2023-10-21 NOTE — Telephone Encounter (Signed)
 Refills sent

## 2023-10-21 NOTE — Anesthesia Preprocedure Evaluation (Signed)
 Anesthesia Evaluation  Patient identified by MRN, date of birth, ID band Patient awake    Reviewed: Allergy & Precautions, H&P , NPO status , Patient's Chart, lab work & pertinent test results, reviewed documented beta blocker date and time   Airway Mallampati: II  TM Distance: >3 FB Neck ROM: full    Dental no notable dental hx. (+) Dental Advisory Given, Teeth Intact   Pulmonary asthma , sleep apnea    Pulmonary exam normal breath sounds clear to auscultation       Cardiovascular Exercise Tolerance: Good hypertension, + DOE  Normal cardiovascular exam Rhythm:regular Rate:Normal     Neuro/Psych negative neurological ROS  negative psych ROS   GI/Hepatic negative GI ROS, Neg liver ROS,GERD  ,,  Endo/Other  negative endocrine ROS    Renal/GU negative Renal ROS  negative genitourinary   Musculoskeletal  (+) Arthritis , Osteoarthritis,    Abdominal   Peds  Hematology negative hematology ROS (+)   Anesthesia Other Findings   Reproductive/Obstetrics negative OB ROS                             Anesthesia Physical Anesthesia Plan  ASA: 2  Anesthesia Plan: General   Post-op Pain Management: Dilaudid IV   Induction:   PONV Risk Score and Plan: Ondansetron, Dexamethasone and Midazolam  Airway Management Planned: LMA  Additional Equipment: None  Intra-op Plan:   Post-operative Plan:   Informed Consent: I have reviewed the patients History and Physical, chart, labs and discussed the procedure including the risks, benefits and alternatives for the proposed anesthesia with the patient or authorized representative who has indicated his/her understanding and acceptance.     Dental Advisory Given  Plan Discussed with: CRNA  Anesthesia Plan Comments:        Anesthesia Quick Evaluation

## 2023-10-22 ENCOUNTER — Encounter (HOSPITAL_COMMUNITY): Payer: Self-pay | Admitting: Surgery

## 2023-10-22 ENCOUNTER — Encounter (HOSPITAL_COMMUNITY)
Admission: RE | Disposition: A | Discharge status: Home / Self Care | Payer: Self-pay | Source: Home / Self Care | Attending: Surgery

## 2023-10-22 ENCOUNTER — Ambulatory Visit (HOSPITAL_COMMUNITY)
Admission: RE | Admit: 2023-10-22 | Discharge: 2023-10-22 | Disposition: A | Discharge status: Home / Self Care | Source: Ambulatory Visit | Attending: Surgery | Admitting: Surgery

## 2023-10-22 ENCOUNTER — Other Ambulatory Visit: Payer: Self-pay | Admitting: Family Medicine

## 2023-10-22 ENCOUNTER — Ambulatory Visit (HOSPITAL_COMMUNITY)
Admission: RE | Admit: 2023-10-22 | Discharge: 2023-10-22 | Disposition: A | Discharge status: Home / Self Care | Attending: Surgery | Admitting: Surgery

## 2023-10-22 ENCOUNTER — Other Ambulatory Visit: Payer: Self-pay

## 2023-10-22 ENCOUNTER — Ambulatory Visit (HOSPITAL_COMMUNITY): Admitting: Anesthesiology

## 2023-10-22 ENCOUNTER — Ambulatory Visit (HOSPITAL_COMMUNITY)

## 2023-10-22 ENCOUNTER — Ambulatory Visit: Payer: Medicare Other | Admitting: Family Medicine

## 2023-10-22 ENCOUNTER — Ambulatory Visit (HOSPITAL_BASED_OUTPATIENT_CLINIC_OR_DEPARTMENT_OTHER): Admitting: Anesthesiology

## 2023-10-22 DIAGNOSIS — Z7982 Long term (current) use of aspirin: Secondary | ICD-10-CM | POA: Diagnosis not present

## 2023-10-22 DIAGNOSIS — J45909 Unspecified asthma, uncomplicated: Secondary | ICD-10-CM | POA: Diagnosis not present

## 2023-10-22 DIAGNOSIS — J9811 Atelectasis: Secondary | ICD-10-CM | POA: Diagnosis not present

## 2023-10-22 DIAGNOSIS — E785 Hyperlipidemia, unspecified: Secondary | ICD-10-CM | POA: Diagnosis not present

## 2023-10-22 DIAGNOSIS — Z853 Personal history of malignant neoplasm of breast: Secondary | ICD-10-CM | POA: Insufficient documentation

## 2023-10-22 DIAGNOSIS — F419 Anxiety disorder, unspecified: Secondary | ICD-10-CM | POA: Insufficient documentation

## 2023-10-22 DIAGNOSIS — C50511 Malignant neoplasm of lower-outer quadrant of right female breast: Secondary | ICD-10-CM | POA: Insufficient documentation

## 2023-10-22 DIAGNOSIS — C50311 Malignant neoplasm of lower-inner quadrant of right female breast: Secondary | ICD-10-CM | POA: Diagnosis not present

## 2023-10-22 DIAGNOSIS — Z452 Encounter for adjustment and management of vascular access device: Secondary | ICD-10-CM | POA: Diagnosis not present

## 2023-10-22 DIAGNOSIS — Z171 Estrogen receptor negative status [ER-]: Secondary | ICD-10-CM | POA: Diagnosis not present

## 2023-10-22 DIAGNOSIS — Z170 Estrogen receptor positive status [ER+]: Secondary | ICD-10-CM | POA: Diagnosis not present

## 2023-10-22 DIAGNOSIS — Z90710 Acquired absence of both cervix and uterus: Secondary | ICD-10-CM | POA: Diagnosis not present

## 2023-10-22 DIAGNOSIS — R928 Other abnormal and inconclusive findings on diagnostic imaging of breast: Secondary | ICD-10-CM

## 2023-10-22 DIAGNOSIS — I272 Pulmonary hypertension, unspecified: Secondary | ICD-10-CM | POA: Diagnosis not present

## 2023-10-22 DIAGNOSIS — N649 Disorder of breast, unspecified: Secondary | ICD-10-CM | POA: Diagnosis not present

## 2023-10-22 DIAGNOSIS — J90 Pleural effusion, not elsewhere classified: Secondary | ICD-10-CM | POA: Diagnosis not present

## 2023-10-22 DIAGNOSIS — K219 Gastro-esophageal reflux disease without esophagitis: Secondary | ICD-10-CM | POA: Insufficient documentation

## 2023-10-22 DIAGNOSIS — C50911 Malignant neoplasm of unspecified site of right female breast: Secondary | ICD-10-CM

## 2023-10-22 DIAGNOSIS — Z1722 Progesterone receptor negative status: Secondary | ICD-10-CM | POA: Diagnosis not present

## 2023-10-22 DIAGNOSIS — I10 Essential (primary) hypertension: Secondary | ICD-10-CM | POA: Diagnosis not present

## 2023-10-22 DIAGNOSIS — Z1231 Encounter for screening mammogram for malignant neoplasm of breast: Secondary | ICD-10-CM | POA: Diagnosis not present

## 2023-10-22 DIAGNOSIS — Z1732 Human epidermal growth factor receptor 2 negative status: Secondary | ICD-10-CM | POA: Diagnosis not present

## 2023-10-22 DIAGNOSIS — F32A Depression, unspecified: Secondary | ICD-10-CM | POA: Diagnosis not present

## 2023-10-22 DIAGNOSIS — N6310 Unspecified lump in the right breast, unspecified quadrant: Secondary | ICD-10-CM

## 2023-10-22 DIAGNOSIS — I517 Cardiomegaly: Secondary | ICD-10-CM | POA: Diagnosis not present

## 2023-10-22 DIAGNOSIS — N641 Fat necrosis of breast: Secondary | ICD-10-CM | POA: Diagnosis not present

## 2023-10-22 HISTORY — PX: AXILLARY SENTINEL NODE BIOPSY: SHX5738

## 2023-10-22 HISTORY — PX: PORTACATH PLACEMENT: SHX2246

## 2023-10-22 HISTORY — PX: BREAST LUMPECTOMY WITH RADIO FREQUENCY LOCALIZER: SHX6897

## 2023-10-22 SURGERY — BREAST LUMPECTOMY WITH RADIO FREQUENCY LOCALIZER
Anesthesia: General | Site: Chest | Laterality: Right

## 2023-10-22 MED ORDER — MAGTRACE LYMPHATIC TRACER
INTRAMUSCULAR | Status: DC | PRN
  Administered 2023-10-22: 2 mL via INTRAMUSCULAR

## 2023-10-22 MED ORDER — PHENYLEPHRINE HCL-NACL 20-0.9 MG/250ML-% IV SOLN
INTRAVENOUS | Status: DC | PRN
  Administered 2023-10-22: 20 ug/min via INTRAVENOUS

## 2023-10-22 MED ORDER — LIDOCAINE HCL (PF) 2 % IJ SOLN
INTRAMUSCULAR | Status: AC
  Filled 2023-10-22: qty 5

## 2023-10-22 MED ORDER — SUGAMMADEX SODIUM 200 MG/2ML IV SOLN
INTRAVENOUS | Status: DC | PRN
  Administered 2023-10-22: 200 mg via INTRAVENOUS

## 2023-10-22 MED ORDER — PROPOFOL 10 MG/ML IV BOLUS
INTRAVENOUS | Status: AC
  Filled 2023-10-22: qty 20

## 2023-10-22 MED ORDER — ONDANSETRON HCL 4 MG/2ML IJ SOLN
INTRAMUSCULAR | Status: DC | PRN
  Administered 2023-10-22: 4 mg via INTRAVENOUS

## 2023-10-22 MED ORDER — OXYCODONE HCL 5 MG/5ML PO SOLN: 5.0000 mg | Freq: Once | ORAL | Status: DC | PRN

## 2023-10-22 MED ORDER — SCOPOLAMINE 1 MG/3DAYS TD PT72
MEDICATED_PATCH | TRANSDERMAL | Status: AC
  Filled 2023-10-22: qty 1

## 2023-10-22 MED ORDER — CHLORHEXIDINE GLUCONATE CLOTH 2 % EX PADS: 6.0000 | MEDICATED_PAD | Freq: Once | CUTANEOUS | Status: DC

## 2023-10-22 MED ORDER — FENTANYL CITRATE PF 50 MCG/ML IJ SOSY: 25.0000 ug | PREFILLED_SYRINGE | INTRAMUSCULAR | Status: DC | PRN

## 2023-10-22 MED ORDER — CEFAZOLIN SODIUM-DEXTROSE 2-4 GM/100ML-% IV SOLN: 2.0000 g | INTRAVENOUS | Status: DC

## 2023-10-22 MED ORDER — LIDOCAINE HCL (PF) 1 % IJ SOLN
INTRAMUSCULAR | Status: AC
  Filled 2023-10-22: qty 30

## 2023-10-22 MED ORDER — OXYCODONE HCL 5 MG PO TABS: 5.0000 mg | ORAL_TABLET | Freq: Once | ORAL | Status: DC | PRN

## 2023-10-22 MED ORDER — PHENYLEPHRINE 80 MCG/ML (10ML) SYRINGE FOR IV PUSH (FOR BLOOD PRESSURE SUPPORT)
PREFILLED_SYRINGE | INTRAVENOUS | Status: AC
  Filled 2023-10-22: qty 10

## 2023-10-22 MED ORDER — DOCUSATE SODIUM 100 MG PO CAPS: 100.0000 mg | ORAL_CAPSULE | Freq: Two times a day (BID) | ORAL | 2 refills | Status: DC

## 2023-10-22 MED ORDER — ORAL CARE MOUTH RINSE: 15.0000 mL | Freq: Once | OROMUCOSAL | Status: AC

## 2023-10-22 MED ORDER — CEFAZOLIN SODIUM-DEXTROSE 2-4 GM/100ML-% IV SOLN
INTRAVENOUS | Status: AC
  Filled 2023-10-22: qty 100

## 2023-10-22 MED ORDER — HEPARIN SOD (PORK) LOCK FLUSH 100 UNIT/ML IV SOLN
INTRAVENOUS | Status: AC
  Filled 2023-10-22: qty 5

## 2023-10-22 MED ORDER — ACETAMINOPHEN 160 MG/5ML PO SOLN
960.0000 mg | Freq: Once | ORAL | Status: AC
  Filled 2023-10-22: qty 30

## 2023-10-22 MED ORDER — CHLORHEXIDINE GLUCONATE 0.12 % MT SOLN
OROMUCOSAL | Status: AC
  Filled 2023-10-22: qty 15

## 2023-10-22 MED ORDER — LACTATED RINGERS IV SOLN: INTRAVENOUS | Status: DC

## 2023-10-22 MED ORDER — ACETAMINOPHEN 500 MG PO TABS
1000.0000 mg | ORAL_TABLET | Freq: Once | ORAL | Status: AC
  Administered 2023-10-22: 1000 mg via ORAL

## 2023-10-22 MED ORDER — ONDANSETRON HCL 4 MG/2ML IJ SOLN
INTRAMUSCULAR | Status: AC
  Filled 2023-10-22: qty 2

## 2023-10-22 MED ORDER — HEPARIN SOD (PORK) LOCK FLUSH 100 UNIT/ML IV SOLN
INTRAVENOUS | Status: DC | PRN
  Administered 2023-10-22: 500 [IU] via INTRAVENOUS

## 2023-10-22 MED ORDER — PROPOFOL 10 MG/ML IV BOLUS
INTRAVENOUS | Status: DC | PRN
Start: 2023-10-22 — End: 2023-10-22
  Administered 2023-10-22: 70 mg via INTRAVENOUS

## 2023-10-22 MED ORDER — DEXAMETHASONE SODIUM PHOSPHATE 10 MG/ML IJ SOLN
INTRAMUSCULAR | Status: DC | PRN
  Administered 2023-10-22: 10 mg via INTRAVENOUS

## 2023-10-22 MED ORDER — ACETAMINOPHEN 500 MG PO TABS
ORAL_TABLET | ORAL | Status: AC
  Filled 2023-10-22: qty 2

## 2023-10-22 MED ORDER — FENTANYL CITRATE (PF) 250 MCG/5ML IJ SOLN
INTRAMUSCULAR | Status: AC
  Filled 2023-10-22: qty 5

## 2023-10-22 MED ORDER — ROCURONIUM BROMIDE 10 MG/ML (PF) SYRINGE
PREFILLED_SYRINGE | INTRAVENOUS | Status: DC | PRN
  Administered 2023-10-22: 40 mg via INTRAVENOUS
  Administered 2023-10-22: 5 mg via INTRAVENOUS
  Administered 2023-10-22 (×2): 10 mg via INTRAVENOUS
  Administered 2023-10-22: 5 mg via INTRAVENOUS

## 2023-10-22 MED ORDER — SCOPOLAMINE 1 MG/3DAYS TD PT72
1.0000 | MEDICATED_PATCH | Freq: Once | TRANSDERMAL | Status: DC
  Administered 2023-10-22: 1.5 mg via TRANSDERMAL

## 2023-10-22 MED ORDER — PHENYLEPHRINE 80 MCG/ML (10ML) SYRINGE FOR IV PUSH (FOR BLOOD PRESSURE SUPPORT)
PREFILLED_SYRINGE | INTRAVENOUS | Status: DC | PRN
Start: 2023-10-22 — End: 2023-10-22
  Administered 2023-10-22: 80 ug via INTRAVENOUS
  Administered 2023-10-22: 160 ug via INTRAVENOUS

## 2023-10-22 MED ORDER — CHLORHEXIDINE GLUCONATE 0.12 % MT SOLN
15.0000 mL | Freq: Once | OROMUCOSAL | Status: AC
  Administered 2023-10-22: 15 mL via OROMUCOSAL

## 2023-10-22 MED ORDER — LIDOCAINE HCL (PF) 1 % IJ SOLN
INTRAMUSCULAR | Status: DC | PRN
  Administered 2023-10-22: 20 mL
  Administered 2023-10-22: 10 mL

## 2023-10-22 MED ORDER — SODIUM CHLORIDE (PF) 0.9 % IJ SOLN
INTRAMUSCULAR | Status: DC | PRN
  Administered 2023-10-22: 500 mL

## 2023-10-22 MED ORDER — FENTANYL CITRATE (PF) 100 MCG/2ML IJ SOLN
INTRAMUSCULAR | Status: DC | PRN
  Administered 2023-10-22: 100 ug via INTRAVENOUS
  Administered 2023-10-22: 50 ug via INTRAVENOUS

## 2023-10-22 MED ORDER — DEXAMETHASONE SODIUM PHOSPHATE 10 MG/ML IJ SOLN
INTRAMUSCULAR | Status: AC
  Filled 2023-10-22: qty 1

## 2023-10-22 MED ORDER — SUGAMMADEX SODIUM 200 MG/2ML IV SOLN
INTRAVENOUS | Status: AC
  Filled 2023-10-22: qty 2

## 2023-10-22 MED ORDER — BUPIVACAINE HCL (PF) 0.5 % IJ SOLN
INTRAMUSCULAR | Status: AC
  Filled 2023-10-22: qty 30

## 2023-10-22 MED ORDER — ROCURONIUM BROMIDE 10 MG/ML (PF) SYRINGE
PREFILLED_SYRINGE | INTRAVENOUS | Status: AC
  Filled 2023-10-22: qty 10

## 2023-10-22 MED ORDER — OXYCODONE HCL 5 MG PO TABS: 5.0000 mg | ORAL_TABLET | Freq: Four times a day (QID) | ORAL | 0 refills | Status: DC | PRN

## 2023-10-22 MED ORDER — METHYLENE BLUE (ANTIDOTE) 1 % IV SOLN
INTRAVENOUS | Status: AC
Start: 2023-10-22 — End: ?
  Filled 2023-10-22: qty 10

## 2023-10-22 MED ORDER — EPHEDRINE SULFATE-NACL 50-0.9 MG/10ML-% IV SOSY
PREFILLED_SYRINGE | INTRAVENOUS | Status: DC | PRN
Start: 2023-10-22 — End: 2023-10-22
  Administered 2023-10-22 (×3): 5 mg via INTRAVENOUS

## 2023-10-22 MED ORDER — PHENYLEPHRINE HCL-NACL 20-0.9 MG/250ML-% IV SOLN
INTRAVENOUS | Status: AC
  Filled 2023-10-22: qty 250

## 2023-10-22 MED ORDER — ACETAMINOPHEN 500 MG PO TABS: 1000.0000 mg | ORAL_TABLET | Freq: Four times a day (QID) | ORAL | 0 refills | Status: AC

## 2023-10-22 MED ORDER — ONDANSETRON HCL 4 MG PO TABS: 4.0000 mg | ORAL_TABLET | Freq: Every day | ORAL | 1 refills | Status: DC | PRN

## 2023-10-22 MED ORDER — SODIUM CHLORIDE 0.9 % IV SOLN: 12.5000 mg | INTRAVENOUS | Status: DC | PRN

## 2023-10-22 MED ORDER — LACTATED RINGERS IV SOLN
INTRAVENOUS | Status: DC
Start: 2023-10-22 — End: 2023-10-22

## 2023-10-22 MED ORDER — EPHEDRINE 5 MG/ML INJ
INTRAVENOUS | Status: AC
  Filled 2023-10-22: qty 5

## 2023-10-22 MED ORDER — CEFAZOLIN SODIUM-DEXTROSE 2-3 GM-%(50ML) IV SOLR
INTRAVENOUS | Status: DC | PRN
  Administered 2023-10-22: 2 g via INTRAVENOUS

## 2023-10-22 SURGICAL SUPPLY — 55 items
APPLICATOR CHLORAPREP 10.5 ORG (MISCELLANEOUS) ×4 IMPLANT
BAG DECANTER FOR FLEXI CONT (MISCELLANEOUS) ×4 IMPLANT
BINDER ABDOMINAL 12 ML 46-62 (SOFTGOODS) IMPLANT
BLADE SURG 15 STRL LF DISP TIS (BLADE) ×4 IMPLANT
BLADE SURG SZ11 CARB STEEL (BLADE) ×4 IMPLANT
CHLORAPREP W/TINT 26 (MISCELLANEOUS) ×4 IMPLANT
CLOTH BEACON ORANGE TIMEOUT ST (SAFETY) ×4 IMPLANT
COVER LIGHT HANDLE STERIS (MISCELLANEOUS) ×8 IMPLANT
COVER PROBE U/S 5X48 (MISCELLANEOUS) ×4 IMPLANT
COVER PROBE W GEL 5X96 (DRAPES) ×4 IMPLANT
DERMABOND ADVANCED .7 DNX12 (GAUZE/BANDAGES/DRESSINGS) ×4 IMPLANT
DEVICE DUBIN W/COMP PLATE 8390 (MISCELLANEOUS) IMPLANT
DISSECTOR SURG LIGASURE 21 (MISCELLANEOUS) ×4 IMPLANT
DRAPE C-ARM FOLDED MOBILE STRL (DRAPES) ×4 IMPLANT
DRAPE CHEST BREAST 15X10 FENES (DRAPES) IMPLANT
ELECT REM PT RETURN 9FT ADLT (ELECTROSURGICAL) ×3 IMPLANT
ELECTRODE REM PT RTRN 9FT ADLT (ELECTROSURGICAL) ×4 IMPLANT
FORCEPS ALLIS DISP 8 (DISPOSABLE) IMPLANT
FORCEPS BABCOCK DISP 6 (DISPOSABLE) IMPLANT
GLOVE BIO SURGEON STRL SZ 6.5 (GLOVE) IMPLANT
GLOVE BIOGEL PI IND STRL 6.5 (GLOVE) ×4 IMPLANT
GLOVE BIOGEL PI IND STRL 7.0 (GLOVE) ×8 IMPLANT
GLOVE BIOGEL PI IND STRL 7.5 (GLOVE) IMPLANT
GLOVE ECLIPSE 7.0 STRL STRAW (GLOVE) IMPLANT
GLOVE SURG SS PI 6.5 STRL IVOR (GLOVE) ×12 IMPLANT
GLOVE SURG SS PI 7.0 STRL IVOR (GLOVE) IMPLANT
GOWN STRL REUS W/TWL LRG LVL3 (GOWN DISPOSABLE) ×12 IMPLANT
IV NS 500ML BAXH (IV SOLUTION) ×4 IMPLANT
KIT MARKER MARGIN INK (KITS) ×4 IMPLANT
KIT PORT INFUSION SMART 8FR (Port) IMPLANT
KIT TURNOVER KIT A (KITS) ×4 IMPLANT
MANIFOLD NEPTUNE II (INSTRUMENTS) ×4 IMPLANT
NDL HYPO 18GX1.5 BLUNT FILL (NEEDLE) ×4 IMPLANT
NDL HYPO 21X1.5 SAFETY (NEEDLE) ×4 IMPLANT
NDL HYPO 25X1 1.5 SAFETY (NEEDLE) ×4 IMPLANT
NEEDLE HYPO 18GX1.5 BLUNT FILL (NEEDLE) ×3 IMPLANT
NEEDLE HYPO 21X1.5 SAFETY (NEEDLE) ×3 IMPLANT
NEEDLE HYPO 25X1 1.5 SAFETY (NEEDLE) ×3 IMPLANT
NS IRRIG 1000ML POUR BTL (IV SOLUTION) ×4 IMPLANT
PACK MINOR (CUSTOM PROCEDURE TRAY) ×4 IMPLANT
PAD ARMBOARD POSITIONER FOAM (MISCELLANEOUS) ×8 IMPLANT
POSITIONER HEAD 8X9X4 ADT (SOFTGOODS) ×4 IMPLANT
RETRACTOR HANDHELD DISP 8.5 (DISPOSABLE) IMPLANT
SET BASIN LINEN APH (SET/KITS/TRAYS/PACK) ×4 IMPLANT
SET LOCALIZER 20 PROBE US (MISCELLANEOUS) ×4 IMPLANT
SOL PREP PROV IODINE SCRUB 4OZ (MISCELLANEOUS) ×4 IMPLANT
SPONGE T-LAP 18X18 ~~LOC~~+RFID (SPONGE) IMPLANT
SUT MNCRL AB 4-0 PS2 18 (SUTURE) ×8 IMPLANT
SUT SILK 2 0 SH (SUTURE) ×4 IMPLANT
SUT VIC AB 3-0 SH 27X BRD (SUTURE) ×8 IMPLANT
SYR 10ML LL (SYRINGE) ×8 IMPLANT
SYR 30ML LL (SYRINGE) ×4 IMPLANT
SYR BULB IRRIG 60ML STRL (SYRINGE) ×4 IMPLANT
SYR CONTROL 10ML LL (SYRINGE) ×4 IMPLANT
TRACER MAGTRACE VIAL (MISCELLANEOUS) ×4 IMPLANT

## 2023-10-22 NOTE — Interval H&P Note (Signed)
 History and Physical Interval Note:  10/22/2023 7:23 AM  [name]  has presented today for surgery, with the diagnosis of INVASIVE DUCTAL CARCINOMA, RIGHT BREAST.  The various methods of treatment have been discussed with the patient and family. After consideration of risks, benefits and other options for treatment, the patient has consented to  Procedure(s): BREAST LUMPECTOMY WITH RADIO FREQUENCY LOCALIZER (Right) BIOPSY, LYMPH NODE, SENTINEL, AXILLARY (Right) INJECTION, FOR SENTINEL LYMPH NODE IDENTIFICATION (Right) INSERTION, TUNNELED CENTRAL VENOUS DEVICE, WITH PORT (Left) as a surgical intervention.  The patient's history has been reviewed, patient examined, no change in status, stable for surgery.  I have reviewed the patient's chart and labs.  Questions were answered to the patient's satisfaction.     Everard Interrante A Jahel Wavra

## 2023-10-22 NOTE — Anesthesia Procedure Notes (Signed)
 Procedure Name: Intubation Date/Time: 10/22/2023 7:41 AM  Performed by: Lorin Glass, CRNAPre-anesthesia Checklist: Patient identified, Emergency Drugs available, Suction available and Patient being monitored Patient Re-evaluated:Patient Re-evaluated prior to induction Oxygen Delivery Method: Circle system utilized Preoxygenation: Pre-oxygenation with 100% oxygen Induction Type: IV induction Ventilation: Mask ventilation without difficulty Laryngoscope Size: Mac and 3 Grade View: Grade I Tube type: Oral Tube size: 7.0 mm Number of attempts: 1 Airway Equipment and Method: Stylet Placement Confirmation: ETT inserted through vocal cords under direct vision, positive ETCO2 and breath sounds checked- equal and bilateral Secured at: 20 cm Tube secured with: Tape Dental Injury: Teeth and Oropharynx as per pre-operative assessment

## 2023-10-22 NOTE — Discharge Instructions (Addendum)
 Ambulatory Surgery Discharge Instructions  General Anesthesia or Sedation Do not drive or operate heavy machinery for 24 hours.  Do not consume alcohol, tranquilizers, sleeping medications, or any non-prescribed medications for 24 hours. Do not make important decisions or sign any important papers in the next 24 hours. You should have someone with you tonight at home.  Activity  You are advised to go directly home from the hospital.  Restrict your activities and rest for a day.  Resume light activity tomorrow. No heavy lifting over 10 lbs or strenuous exercise. Wear a compression bra at all times except while bathing for the next 1-2 weeks  Fluids and Diet Begin with clear liquids, bouillon, dry toast, soda crackers.  If not nauseated, you may go to a regular diet when you desire.  Greasy and spicy foods are not advised.  Medications  If you have not had a bowel movement in 24 hours, take 2 tablespoons over the counter Milk of mag.             You May resume your blood thinners tomorrow (Aspirin, coumadin, or other).  You are being discharged with prescriptions for Opioid/Narcotic Medications: There are some specific considerations for these medications that you should know. Opioid Meds have risks & benefits. Addiction to these meds is always a concern with prolonged use Take medication only as directed Do not drive while taking narcotic pain medication Do not crush tablets or capsules Do not use a different container than medication was dispensed in Lock the container of medication in a cool, dry place out of reach of children and pets. Opioid medication can cause addiction Do not share with anyone else (this is a felony) Do not store medications for future use. Dispose of them properly.     Disposal:  Find a [employer] household drug take back site near you.  If you can't get to a drug take back site, use the recipe below as a last resort to dispose of expired, unused or unwanted  drugs. Disposal  (Do not dispose chemotherapy drugs this way, talk to your prescribing doctor instead.) Step 1: Mix drugs (do not crush) with dirt, kitty litter, or used coffee grounds and add a small amount of water to dissolve any solid medications. Step 2: Seal drugs in plastic bag. Step 3: Place plastic bag in trash. Step 4: Take prescription container and scratch out personal information, then recycle or throw away.  Operative Site  You have a liquid bandage over your incisions, this will begin to flake off in about a week. Ok to English as a second language teacher. Keep wound clean and dry. No baths or swimming. No lifting more than 10 pounds.  Contact Information: If you have questions or concerns, please call our office, [phone], Monday- Thursday 8AM-5PM and Friday 8AM-12Noon.  If it is after hours or on the weekend, please call [facility], [phone], and ask to speak to the surgeon on call for Dr. Robyne Peers at [facility].   SPECIFIC COMPLICATIONS TO WATCH FOR: Inability to urinate Fever over 101? F by mouth Nausea and vomiting lasting longer than 24 hours. Pain not relieved by medication ordered Swelling around the operative site Increased redness, warmth, hardness, around operative area Numbness, tingling, or cold fingers or toes Blood -soaked dressing, (small amounts of oozing may be normal) Increasing and progressive drainage from surgical area or exam site   Post Anesthesia Home Care Instructions  Activity: Get plenty of rest for the remainder of the day. A responsible  individual must stay with you for 24 hours following the procedure.  For the next 24 hours, DO NOT: -Drive a car -Advertising copywriter -Drink alcoholic beverages -Take any medication unless instructed by your physician -Make any legal decisions or sign important papers.  Meals: Start with liquid foods such as gelatin or soup. Progress to regular foods as tolerated. Avoid greasy, spicy, heavy foods. If nausea  and/or vomiting occur, drink only clear liquids until the nausea and/or vomiting subsides. Call your physician if vomiting continues.  Special Instructions/Symptoms: Your throat may feel dry or sore from the anesthesia or the breathing tube placed in your throat during surgery. If this causes discomfort, gargle with warm salt water. The discomfort should disappear within 24 hours.  If you had a scopolamine patch placed behind your ear for the management of post- operative nausea and/or vomiting:  1. The medication in the patch is effective for 72 hours, after which it should be removed.  Wrap patch in a tissue and discard in the trash. Wash hands thoroughly with soap and water. 2. You may remove the patch earlier than 72 hours if you experience unpleasant side effects which may include dry mouth, dizziness or visual disturbances. 3. Avoid touching the patch. Wash your hands with soap and water after contact with the patch.

## 2023-10-22 NOTE — Transfer of Care (Signed)
 Immediate Anesthesia Transfer of Care Note  Patient: [name]  Procedure(s) Performed: BREAST LUMPECTOMY WITH RADIO FREQUENCY LOCALIZER (Right: Breast) BIOPSY, LYMPH NODE, SENTINEL, AXILLARY (Right: Axilla) INJECTION, FOR SENTINEL LYMPH NODE IDENTIFICATION (Right: Axilla) INSERTION, TUNNELED CENTRAL VENOUS DEVICE, WITH PORT (Left: Chest)  Patient Location: PACU  Anesthesia Type:General  Level of Consciousness: drowsy  Airway & Oxygen Therapy: Patient Spontanous Breathing and Patient connected to nasal cannula oxygen  Post-op Assessment: Report given to RN and Post -op Vital signs reviewed and stable  Post vital signs: Reviewed and stable  Last Vitals:  Vitals Value Taken Time  BP 128/53   Temp    Pulse 69 10/22/23 1057  Resp 22 10/22/23 1057  SpO2 100 % 10/22/23 1057  Vitals shown include unfiled device data.  Last Pain:  Vitals:   10/22/23 0651  TempSrc: Oral  PainSc: 0-No pain      Patients Stated Pain Goal: 5 (10/22/23 9147)  Complications: No notable events documented.

## 2023-10-22 NOTE — Op Note (Signed)
 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   Postoperative Diagnosis: Same   Procedure(s) Performed: Left internal jugular Port-A-Cath insertion; right breast lumpectomy using radiofrequency localizer and with sentinel lymph node biopsy   Surgeon: Theophilus Kinds, DO    Assistants: Lelon Frohlich, RNFA   Anesthesia: General endotracheal   Anesthesiologist: Ronelle Nigh, MD    Specimens: Right breast mass, right axillary sentinel lymph node, 2-3 additional right axillary lymph nodes   Estimated Blood Loss: 30 cc   Blood Replacement: None    Complications: None   Wound Class: Clean   Operative Indications: Patient is a 78 year old female who was recently diagnosed with right breast invasive ductal carcinoma after abnormality was noted on a screening mammogram.  We discussed her surgical options, patient decided to proceed with right breast lumpectomy with sentinel lymph node biopsy.  After discussions with oncology, decision was made to also place Port-A-Cath at the time of surgery.  Patient is agreeable to surgery.  All risks and benefits of performing this procedure were discussed with the patient including pain, infection, bleeding, damage to the surrounding structures, and need for more procedures or surgery. The patient voiced understanding of the procedure, all questions were sought and answered, and consent was obtained.  Findings: -Appropriately positioned left IJ Port-A-Cath at completion of procedure, hemostasis noted -Removal of right breast mass, with both radiofrequency localizer and biopsy clip within specimen -Sentinel lymph node removed in addition to 2-3 other axillary lymph nodes -Hemostasis noted at completion of case   Procedure: The patient was taken to the operating room and placed supine. General endotracheal anesthesia was induced. Intravenous  antibiotics were administered per protocol.  Preoperative localization was performed by the department of radiology via a Faxitron clip.  In the OR, mag trace was injected in the subareolar region. This was massaged gently for several minutes. The breast, chest wall, axilla, and upper arm and neck were prepped and draped in the usual sterile fashion.  Using ultrasound guidance, the skin overlying the left internal jugular was localized.  Again using ultrasound, the access needle was advanced into the left internal jugular vein using the Seldinger technique without difficulty. A guidewire was then advanced into the right atrium under fluoroscopic guidance.  Ectopia was not noted. The skin was knicked.  The pocket location and track were localized with Marcaine.  An incision was made below the left clavicle.  A subcutaneous pocket was formed. The catheter was then tunneled from the pocket to the access site in the neck.  An introducer and peel-away sheath were placed over the guidewire. The catheter was then inserted through the peel-away sheath and the peel-away sheath was removed.  A spot film was performed to confirm the position. The catheter was then attached to the port and the port placed in subcutaneous pocket. Adequate positioning was confirmed by fluoroscopy. Hemostasis was confirmed.  Good backflow of blood was noted on aspiration of the port. The port was flushed with heparin flush. Subcutaneous layer was reapproximated using a 3-0 Vicryl interrupted suture. The skin was closed using a 4-0 Vicryl subcuticular suture. Dermabond was applied.  Attention was then turned to the right breast.  Preoperative radiofrequency tag localization was performed by radiology. Localization studies were reviewed.  The Faxitron probe was used to help determine the best location for incision, and an elliptical incision was made and flaps were raised.  The Faxitron probe was placed within the depths  of the wounds and upon  dissection, the clip was identified.  A ball of tissue was taken surrounding this area.  The probe was then used to demonstrate that the clip was removed with the specimen.  The specimen was oriented using our painting system. This specimen was first sent to mammogram, which confirmed both biopsy clip and faxitron clip were within the specimen.  The specimen was then sent to pathology for evaluation.  Electrocautery was used to achieve hemostasis.  The MagTrace probe was used to identify the location of the hottest spot in the axilla.  Prior to the incision, the counts were 50000 at the nipple.  MagTrace probe was then held over the axilla until a positive signal was recognized.  An incision was made along the natural skin crease and dissection was carried down until hot lymph node was identified. The probe was placed in contact with the node and positive counts were noted. The node was excised in its entirety assuring hemostasis and ligation of lymph vessels. Ex vivo the node measured approximately 120. Two additional hot spots were also removed.  Ex vivo the second node measured 30 and the third measured 350.  The lymph node that measured 350 was marked as the sentinel lymph node, while the other 2 nodes were marked separately as additional right axillary lymph nodes.  The bed of the node was measured again with no further hot spots noted, and there were no palpable nodes that were grossly 1cm or greater and no further brown nodes were visualized. The lymph node specimens were sent to pathology for evaluation.  The axillary incision was localized with Marcaine.  Within the axillary incision, the clavipectoral fascia was approximated.  The subcutaneous tissue was reapproximated with interrupted 3-0 Vicryl sutures in the dermis, followed by skin closure with a subcuticular 4-0 monocryl suture.  Attention was turned back to the breast incision.  The incision was localized with Marcaine.  Hemostasis was noted.   The subcutaneous tissue was reapproximated with interrupted 3-0 Vicryl sutures in the dermis, followed by skin closure with a subcuticular 4-0 Monocryl suture.  Both the axillary and right breast incision sites were dressed with Dermabond.  Final inspection revealed acceptable hemostasis. All counts were correct at the end of the case. The patient was awakened from anesthesia and extubated without complication.  The patient went to the PACU in stable condition with plan to obtain a chest x-ray in PACU.   Theophilus Kinds, DO  [facility] [address] [place], [place] [address] [phone] (office)

## 2023-10-22 NOTE — Progress Notes (Signed)
 [facility]  Spoke with the patient's family in the consultation room.  I explained that she tolerated the procedure without difficulty.  She has dissolvable stitches under the skin with overlying skin glue.  This will flake off in 10 to 14 days.  I discharged her home with a prescription for narcotic pain medication that they should take as needed for pain.  I also want her taking scheduled Tylenol.  If they take the narcotic pain medication, they should take a stool softener as well.  The patient will follow-up with me in 2 weeks.  I will call her with her pathology results once they have returned.  All questions were answered to their expressed satisfaction.  Theophilus Kinds, DO [facility] [address] [place], [place] [address] [phone] (office)

## 2023-10-22 NOTE — Telephone Encounter (Signed)
 Copied from CRM [id number]. Topic: Clinical - Medication Refill >> Oct 22, 2023 12:28 PM Carlatta H wrote: Most Recent Primary Care Visit:  Provider: Kerri Perches  Department: [facility]  Visit Type: OFFICE VISIT  Date: 10/14/2023  Medication:  Rx #: [id number] cloNIDine (CATAPRES) 0.2 MG tablet [[id number]] Patient was seen on 3/25  Has the patient contacted their pharmacy? No (Agent: If no, request that the patient contact the pharmacy for the refill. If patient does not wish to contact the pharmacy document the reason why and proceed with request.) (Agent: If yes, when and what did the pharmacy advise?)  Is this the correct pharmacy for this prescription? Yes If no, delete pharmacy and type the correct one.  This is the patient's preferred pharmacy:      [id number] [place], [place] - [address] [place] [address] Phone: [phone] Fax: [phone]   Has the prescription been filled recently? No  Is the patient out of the medication? Yes  Has the patient been seen for an appointment in the last year OR does the patient have an upcoming appointment? Yes  Can we respond through MyChart? No  Agent: Please be advised that Rx refills may take up to 3 business days. We ask that you follow-up with your pharmacy.

## 2023-10-22 NOTE — Anesthesia Postprocedure Evaluation (Signed)
 Anesthesia Post Note  Patient: [name]  Procedure(s) Performed: BREAST LUMPECTOMY WITH RADIO FREQUENCY LOCALIZER (Right: Breast) BIOPSY, LYMPH NODE, SENTINEL, AXILLARY (Right: Axilla) INJECTION, FOR SENTINEL LYMPH NODE IDENTIFICATION (Right: Axilla) INSERTION, TUNNELED CENTRAL VENOUS DEVICE, WITH PORT (Left: Chest)  Patient location during evaluation: PACU Anesthesia Type: General Level of consciousness: awake and alert Pain management: pain level controlled Vital Signs Assessment: post-procedure vital signs reviewed and stable Respiratory status: spontaneous breathing, nonlabored ventilation, respiratory function stable and patient connected to nasal cannula oxygen Cardiovascular status: blood pressure returned to baseline and stable Postop Assessment: no apparent nausea or vomiting Anesthetic complications: no   There were no known notable events for this encounter.   Last Vitals:  Vitals:   10/22/23 1145 10/22/23 1148  BP: (!) 173/67 (!) 141/71  Pulse: 66 66  Resp: (!) 26 19  Temp: (!) 36.4 C (!) 36.4 C  SpO2: 100% 100%    Last Pain:  Vitals:   10/22/23 1148  TempSrc:   PainSc: 0-No pain                 Arrow Emmerich L Awanda Wilcock

## 2023-10-22 NOTE — Telephone Encounter (Signed)
 Attempted to call [facility] to request they transfer prescription for patient. No answer and unable to LVM. Cancelling reorder as patient has prescription available and sent yesterday to [facility].

## 2023-10-23 ENCOUNTER — Other Ambulatory Visit (HOSPITAL_COMMUNITY): Payer: Self-pay

## 2023-10-23 ENCOUNTER — Other Ambulatory Visit: Payer: Self-pay | Admitting: Family Medicine

## 2023-10-23 ENCOUNTER — Encounter (HOSPITAL_COMMUNITY): Payer: Self-pay | Admitting: Surgery

## 2023-10-23 ENCOUNTER — Ambulatory Visit: Payer: Self-pay

## 2023-10-23 MED ORDER — CLONIDINE HCL 0.2 MG PO TABS: 0.2000 mg | ORAL_TABLET | Freq: Every day | ORAL | 0 refills | Status: DC

## 2023-10-23 NOTE — Telephone Encounter (Signed)
 Pt's son calls on behalf of the pt. Pt requested a refill of clonidine earlier today. Son wondering where the med is, states he has not received a call from pharmacy. [facility] said [facility] will be ready in an hour. RN advised son it can be picked up then. Son verbalized understanding.   Copied from CRM [id number]. Topic: Clinical - Red Word Triage >> Oct 23, 2023  4:24 PM Danika B wrote: Red Word that prompted transfer to Nurse Triage: Patients son on the line stating that he put in a high priority request for mothers Rx cloNIDine (CATAPRES) 0.2 MG tablet-blood pressure medication and has not been contacted yet as he was told he would be. Patient just got out of surgery yesterday and needs medication filled asap to control levels. Reason for Disposition  General information question, no triage required and triager able to answer question  Answer Assessment - Initial Assessment Questions 1. REASON FOR CALL or QUESTION: "What is your reason for calling today?" or "How can I best help you?" or "What question do you have that I can help answer?"     Son calls about clonidine they requested for refill today.  Protocols used: Information Only Call - No Triage-A-AH

## 2023-10-23 NOTE — Telephone Encounter (Signed)
 Copied from CRM [id number]. Topic: Clinical - Medication Refill >> Oct 23, 2023 10:01 AM Georgeanna Harrison H wrote: Most Recent Primary Care Visit:  Provider: Kerri Perches  Department: [facility]  Visit Type: OFFICE VISIT  Date: 10/14/2023  Medication: cloNIDine (CATAPRES) 0.2 MG tablet  Has the patient contacted their pharmacy? Yes (Agent: If no, request that the patient contact the pharmacy for the refill. If patient does not wish to contact the pharmacy document the reason why and proceed with request.) (Agent: If yes, when and what did the pharmacy advise?)  Is this the correct pharmacy for this prescription? Yes If no, delete pharmacy and type the correct one.  This is the patient's preferred pharmacy:    [id number] [place], [place] - [address] [place] [address] Phone: [phone] Fax: [phone]   Has the prescription been filled recently? Yes  Is the patient out of the medication? Yes  Has the patient been seen for an appointment in the last year OR does the patient have an upcoming appointment? Yes  Can we respond through MyChart? Yes  Agent: Please be advised that Rx refills may take up to 3 business days. We ask that you follow-up with your pharmacy.

## 2023-10-24 LAB — SURGICAL PATHOLOGY

## 2023-10-27 ENCOUNTER — Telehealth (INDEPENDENT_AMBULATORY_CARE_PROVIDER_SITE_OTHER): Admitting: Surgery

## 2023-10-27 DIAGNOSIS — C50311 Malignant neoplasm of lower-inner quadrant of right female breast: Secondary | ICD-10-CM

## 2023-10-27 DIAGNOSIS — Z171 Estrogen receptor negative status [ER-]: Secondary | ICD-10-CM

## 2023-10-27 NOTE — Telephone Encounter (Signed)
 [facility]  Attempted to call the patient on 2 different occasions to update her on the pathology results after her right breast lumpectomy and sentinel lymph node biopsy.  Unable to leave a voicemail as her mailbox was full.  Will plan to tell her about her pathology results at her follow up visit.  Pathology: A. 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 LYMPHO NODE, SENTINILE, RIGHT, EXCISION:      One lymph node, negative for metastatic carcinoma (0/1)  C. LYMPHO NODE, ADDITIONAL, RIGHT, EXCISION:      Six lymph nodes, negative for metastatic carcinoma (0/6).  ONCOLOGY TABLE:  INVASIVE CARCINOMA OF THE BREAST:  Resection  Procedure: Lumpectomy Specimen Laterality: Right Histologic Type: Invasive ductal carcinoma Histologic Grade:      Glandular (Acinar)/Tubular Differentiation: 3      Nuclear Pleomorphism: 2      Mitotic Rate: 3      Overall Grade: 3 Tumor Size: 2.1 cm Ductal Carcinoma In Situ: Solid type, intermediated grade Lymphatic and/or Vascular Invasion:  Not identified Treatment Effect in the Breast: No known presurgical therapy Margins: All margins negative for invasive carcinoma      Distance from Closest Margin (mm): 1.5 mm from inferior margin      Specify Closest Margin (required only if <75mm): 1.5 mm from inferior margin                                2 mm from anterior margin DCIS Margins: Uninvolved by DCIS      Distance from Closest Margin (mm): 8 mm from  inferior margin      Specify Closest Margin (required only if <83mm): 8 mm from inferior margin Regional Lymph Nodes:      Number of Lymph Nodes Examined: 7      Number of Sentinel Nodes Examined: 1      Number of Lymph Nodes with Macrometastases (>2 mm): 0      Number of Lymph Nodes with Micrometastases: 0      Number of Lymph Nodes with Isolated Tumor Cells (=0.2 mm or =200 cells): 0      Size of Largest Metastatic Deposit (mm): NA      Extranodal Extension: NA Distant Metastasis:      Distant Site(s) Involved: Not applicable Breast Biomarker Testing Performed on Previous Biopsy:      Testing Performed on Case Number: [id number]            Estrogen Receptor: 0%, negative            Progesterone Receptor: 0%, negative            HER2: 0, negative            Ki-67: 30% Pathologic Stage Classification (pTNM, AJCC 8th Edition): pT2, pN0 Representative Tumor Block: A2-A6 Comment(s): None (v4.5.0.0)  Theophilus Kinds, DO [facility] [address]  [address] [place], [place] [address] [phone] (office)

## 2023-10-28 ENCOUNTER — Telehealth

## 2023-10-30 ENCOUNTER — Inpatient Hospital Stay: Admitting: Hematology

## 2023-11-03 ENCOUNTER — Other Ambulatory Visit: Payer: Self-pay | Admitting: Family Medicine

## 2023-11-03 DIAGNOSIS — I10 Essential (primary) hypertension: Secondary | ICD-10-CM

## 2023-11-04 ENCOUNTER — Inpatient Hospital Stay: Attending: Hematology | Admitting: Genetic Counselor

## 2023-11-04 DIAGNOSIS — Z1501 Genetic susceptibility to malignant neoplasm of breast: Secondary | ICD-10-CM | POA: Diagnosis not present

## 2023-11-04 DIAGNOSIS — Z171 Estrogen receptor negative status [ER-]: Secondary | ICD-10-CM | POA: Insufficient documentation

## 2023-11-04 DIAGNOSIS — Z1509 Genetic susceptibility to other malignant neoplasm: Secondary | ICD-10-CM | POA: Diagnosis not present

## 2023-11-04 DIAGNOSIS — Z803 Family history of malignant neoplasm of breast: Secondary | ICD-10-CM

## 2023-11-04 DIAGNOSIS — C50311 Malignant neoplasm of lower-inner quadrant of right female breast: Secondary | ICD-10-CM | POA: Insufficient documentation

## 2023-11-04 DIAGNOSIS — Z8042 Family history of malignant neoplasm of prostate: Secondary | ICD-10-CM

## 2023-11-05 ENCOUNTER — Encounter: Payer: Self-pay | Admitting: Genetic Counselor

## 2023-11-05 NOTE — Progress Notes (Signed)
 GENETIC TEST RESULTS  Patient Name: [name] Patient Age: 78 y.o. Encounter Date: 11/04/2023  Referring Provider: [name], MD   I connected with [name] on 11/04/2023 at 8AM ET by MyChart Video Visit and verified that I am speaking with the correct person using two identifiers.   Patient location: home Provider location: [facility] office   HISTORY OF PRESENT ILLNESS:   [name], a 78 y.o. female, was seen for a [facility] cancer genetics consultation at the request of Dr. Cheree Cords.  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    [name] presents to clinic today virtually with her son, [name], to discuss genetic testing results and to further clarify her future cancer risks, as well as potential cancer risks for family members.    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  VUS in POLE at  p.T41M (c.122C>T).  Report date is 10/09/2023.   The CancerNext-Expanded gene panel offered by [facility] and includes sequencing, rearrangement, and RNA analysis for the following 76 genes: AIP, ALK, APC, ATM, AXIN2, BAP1, BARD1, BMPR1A, BRCA1, BRCA2, BRIP1, CDC73, CDH1, CDK4, CDKN1B, CDKN2A, CEBPA, CHEK2, CTNNA1, DDX41, DICER1, ETV6, FH, FLCN, GATA2, LZTR1, MAX, MBD4, MEN1, MET, MLH1, MSH2, MSH3, MSH6, MUTYH, NF1, NF2, NTHL1, PALB2, PHOX2B, PMS2, POT1, PRKAR1A, PTCH1, PTEN, RAD51C, RAD51D, RB1, RET, RUNX1,  SDHA, SDHAF2, SDHB, SDHC, SDHD, SMAD4, SMARCA4, SMARCB1, SMARCE1, STK11, SUFU, TMEM127, TP53, TSC1, TSC2, VHL, and WT1 (sequencing and deletion/duplication); EGFR, HOXB13, KIT, MITF, PDGFRA, POLD1, and POLE (sequencing only); EPCAM and GREM1 (deletion/duplication only).       RISK FACTORS:  Patient reports hysterectomy and BSO in 40s.   FAMILY HISTORY:  We obtained a detailed, 4-generation family history.  Significant diagnoses are listed below: Family History  Problem Relation Age of Onset   Breast cancer Maternal Aunt        dx >50   Cancer Maternal Aunt        unknown type; ? breast; dx >50?   Prostate cancer Maternal Uncle      There is no reported Ashkenazi Jewish ancestry. There is no known consanguinity.   GENETIC TESTING:  [name] tested positive for a single pathogenic variant in the BRCA2 gene. Specifically, this variant is c.4211delC (p.S1404*).  The test report has been scanned into EPIC and is located under the Molecular Pathology section of the Results Review tab.  A portion of the result report is included below for reference. Genetic testing reported out on October 09 2023.     Genetic testing did identify a variant of uncertain significance (VUS) in the POLE gene called p.T41M (c.122C>T).  At this time, it is unknown if this variant is associated with increased cancer risk or if this is a normal finding, but most variants such as this get reclassified to being inconsequential. It should not be used to make medical management decisions. With time, we suspect the lab will determine the significance  of this variant, if any. If we do learn more about it, we will try to contact [name] to discuss it further. However, it is important to stay in touch with [place] periodically and keep the address and phone number up to date.   Clinical Information: Hereditary breast and ovarian cancer (HBOC) syndrome is characterized by an increased lifetime risk for, generally,  adult-onset cancers including, breast, contralateral breast, female breast, ovarian, prostate, melanoma and pancreatic.  The cancers associated with BRCA2 are: Female breast cancer: 55-69% risk In women with a history of breast cancer, the risk for contralateral breast cancer 20 years after breast cancer diagnosis is 25% Female breast cancer, up to an 7% risk by age 40 Ovarian cancer, 13-29% risk Pancreatic cancer, 5-10% risk Prostate cancer, 19-61% risk Melanoma, elevated risk   Management Recommendations:  Breast Screening/Risk Reduction:  Females: Breast awareness starting at age 15 Clinical breast examination every 6-12 months starting at age 26  Breast cancer screening: Age 76-29 years, annual breast MRI with and without contrast (or mammogram, if MRI is unavailable), although the age to initiate screening may be individualized based on family history if breast cancer before age 31 is present Age 76-75 years, annual mammogram and breast MRI with and without contrast Age >75 years, management should be considered on an individual basis For women with a BRCA2 pathogenic or likely pathogenic variant who are treated for breast cancer and have not had a bilateral mastectomy, screening with annual mammogram and breast MRI should continue as described above. The option of prophylactic bilateral risk-reducing mastectomy (RRM), removal of the breast tissue before cancer develops, is the best option for significantly decreasing the risk of developing breast cancer. Studies have shown mastectomies reduce the risk of breast cancer by 90-95% in women with a BRCA2 mutation.   Males: Breast self-exam training and education starting at age 6 years Annual clinical breast exam starting at age 11 years  Consider annual mammogram starting at age 30 or 10 years before the earliest known female breast cancer in the family (whichever comes first).   Gynecological Cancer Screening/Risk Reduction: It is  recommended that women with a BRCA2 mutation have a risk-reducing salpingo oophorectomy (RRSO), removal of the ovaries and fallopian tubes. It is reasonable to delay RRSO until age 84-45 years unless age at diagnosis in the family warrants earlier age for consideration of RRSO.  Having a RRSO is estimated to reduce the risk of ovarian cancer by up to 96%. There is still a small risk of developing an "ovarian-like" cancer in the lining of the abdomen, called the peritoneum. Another benefit to having the ovaries removed is the risk reduction for breast cancer. If the ovaries are removed before menopause, the risk of developing breast cancer is reduced. Consideration of combination estrogen/progestin contraception (such as oral contraceptive pills) for ovulation suppression. Overall, studies in pathogenic/likely pathogenic variant carriers support significant risk reduction benefits for ovarian cancer.  Levonorgestrel intrauterine device (LNG-IUD) has been shown to reduce risk for ovarian cancer in the average risk population. Ovarian cancer screening is an option for women who chose not to have a RRSO or who have not yet completed their family. Current screening methods for ovarian cancer are neither sensitive nor specific, meaning that often early stage ovarian cancer cannot be diagnosed through this screening.  Screening can also be falsely positive with no cancer present. For this reason, RRSO is recommended over screening. If ovarian cancer screening is recommended by a physician, it could include:  CA-125 blood tests Transvaginal ultrasounds Clinical pelvic exams   Skin Cancer Screening and Risk Reduction: Regular skin self-examinations Individuals should notify their physicians of any changes to moles such as increasing in size, darkening in color, or other change in appearance. Annual skin examinations by a dermatologist  Follow sun-safety recommendations such as: Using UVA and UVB 30 SPF or higher  sunscreen Avoiding sunburns Limiting sun exposure, especially during the hours of 11am-4pm  Wearing protective clothing and sunglasses Avoid using tanning beds For more information about the prevention of melanoma visit [email or web address]   Prostate Cancer Screening: Annual digital rectal exam (DRE) at age 9 Annual PSA blood test at age 31  Pancreatic Cancer Screening/Risk Reduction: Avoid smoking, heavy alcohol use, and obesity. Consider pancreatic cancer screening beginning at age 25 years (or 10 years before the youngest diagnosis of pancreatic cancer in the family, whichever is earlier).  Consider screening using annual contrast-enhanced MRI/magnetic resonance cholangiopancreatography (MRCP) and/or endoscopic ultrasound (EUS), with consideration of shorter screening intervals, based on clinical judgement, for individuals found to have potentially concerning abnormalities on screening.   Additional Considerations: Individuals at risk for developing breast and ovarian cancer may benefit from the use of medication to reduce their risk for cancer. These medications are referred to as chemoprevention. For example, oral contraceptive use has been shown to reduce the risk of ovarian cancer by approximately 60% in BRCA2 mutation carriers if taken for at least 5 years. This risk reduction remains even after discontinuation of oral contraceptives. Studies have suggested PARP inhibitors may be a beneficial chemotherapeutic agent for a subset of patients with BRCA2-associated breast, ovarian, prostate, and pancreatic cancers.  Patients of reproductive age should be made aware of options for prenatal diagnosis and assisted reproduction including pre-implantation genetic diagnosis. Individuals with a single pathogenic BRCA2 variant are carriers of Fanconi anemia. Fanconi anemia is characterized by developmental delay apparent from infancy, short stature, microcephaly, and coarse dysmorphic features. For  there to be a risk of Fanconi anemia in offspring, both the patient and their partner would each have to carry a pathogenic variant in BRCA2. In this case, the risk of having an affected child is 25%.    This information is based on current understanding of the gene and may change in the future.   Implications for Family Members: Hereditary predisposition to cancer due to pathogenic variants in the BRCA2 gene has autosomal dominant inheritance. This means that an individual with a pathogenic variant has a 50% chance of passing the condition on to his/her offspring. Identification of a pathogenic variant allows for the recognition of at-risk relatives who can pursue testing for the familial variant.  Family members are encouraged to consider genetic testing for this familial pathogenic variant. As there are generally no childhood cancer risks associated with a single pathogenic variant in the BRCA2 gene, individuals in the family are not recommended to have testing until they reach at least 76 years of age. They may contact our office at [phone] for more information or to schedule an appointment. Complimentary testing through [employer] for the familial variant is available for 90 days after the genetic testing report date.  Family members who live outside of the area are encouraged to find a genetic counselor in their area by visiting: [email or web address].  Resources: FORCE (Facing Our Risk of Cancer Empowered) is a resource for those with a hereditary predisposition to develop cancer.  FORCE provides information about risk reduction, advocacy, legislation, and clinical trials.  Additionally, FORCE provides a  platform for collaboration and support; which includes: peer navigation, message boards, local support groups, a toll-free helpline, research registry and recruitment, advocate training, published medical research, webinars, brochures, mastectomy photos, and more.   For more information, visit www.facingourrisk.org  Plans:  Breast: Ms. Nitsch recently had a lumpectomy.  Per her surgeon, bilateral mastectomy was not indicated given age.  Recommended she speak with providers about annual breast MRI in addition to annual mammogram when she resumes regular breast imaging.  Ovarian: Per patient, she has had bilateral oophorectomy at time of hysterectomy.  Discussed small residual risk of primary peritoneal cancer remains after oophorectomy.  Recommended prompt reporting of GYN-related symptoms to providers.  Pancreatic:.Discussed that she can follow up with Dr. Linnell Richardson to discuss appropriateness and timing of referral to discuss pancreatic cancer screening ([facility]) Dermatology: not currently established with dermatology.  Can speak with PCP about referral to dermatology.  Family letter provided to encourage family testing.   We encourage Ms. Waddington to keep in touch with genetics to learn of BRCA2 and genetics-related updates.  She is welcome to contact us  if questions arise.      Floraine Buechler M. Ora Billing, MS, [facility] Genetic Counselor [email or web address] [phone]

## 2023-11-06 ENCOUNTER — Encounter: Admitting: Surgery

## 2023-11-11 ENCOUNTER — Encounter: Payer: Self-pay | Admitting: *Deleted

## 2023-11-18 ENCOUNTER — Ambulatory Visit (INDEPENDENT_AMBULATORY_CARE_PROVIDER_SITE_OTHER): Admitting: Surgery

## 2023-11-18 ENCOUNTER — Encounter: Payer: Self-pay | Admitting: Surgery

## 2023-11-18 VITALS — BP 172/74 | HR 58 | Temp 97.4°F | Resp 16 | Ht 62.0 in | Wt 182.0 lb

## 2023-11-18 DIAGNOSIS — Z09 Encounter for follow-up examination after completed treatment for conditions other than malignant neoplasm: Secondary | ICD-10-CM

## 2023-11-18 NOTE — Patient Instructions (Signed)
 A. 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 LYMPHO NODE, SENTINILE, RIGHT, EXCISION:      One lymph node, negative for metastatic carcinoma (0/1)  C. LYMPHO NODE, ADDITIONAL, RIGHT, EXCISION:      Six lymph nodes, negative for metastatic carcinoma (0/6).  ONCOLOGY TABLE:  INVASIVE CARCINOMA OF THE BREAST:  Resection  Procedure: Lumpectomy Specimen Laterality: Right Histologic Type: Invasive ductal carcinoma Histologic Grade:      Glandular (Acinar)/Tubular Differentiation: 3      Nuclear Pleomorphism: 2      Mitotic Rate: 3      Overall Grade: 3 Tumor Size: 2.1 cm Ductal Carcinoma In Situ: Solid type, intermediated grade Lymphatic and/or Vascular Invasion:  Not identified Treatment Effect in the Breast: No known presurgical therapy Margins: All margins negative for invasive carcinoma      Distance from Closest Margin (mm): 1.5 mm from inferior margin      Specify Closest Margin (required only if <50mm): 1.5 mm from inferior margin                                2 mm from anterior margin DCIS Margins: Uninvolved by DCIS      Distance from Closest Margin (mm): 8 mm from inferior margin      Specify Closest Margin (required only if <32mm): 8 mm from inferior margin Regional Lymph Nodes:      Number of Lymph Nodes Examined: 7      Number of Sentinel Nodes Examined: 1      Number of Lymph Nodes with Macrometastases (>2 mm): 0      Number of Lymph Nodes with Micrometastases: 0      Number of Lymph  Nodes with Isolated Tumor Cells (=0.2 mm or =200 cells): 0      Size of Largest Metastatic Deposit (mm): NA      Extranodal Extension: NA Distant Metastasis:      Distant Site(s) Involved: Not applicable Breast Biomarker Testing Performed on Previous Biopsy:      Testing Performed on Case Number: [id number]            Estrogen Receptor: 0%, negative            Progesterone Receptor: 0%, negative            HER2: 0, negative            Ki-67: 30% Pathologic Stage Classification (pTNM, AJCC 8th Edition): pT2, pN0 Representative Tumor Block: A2-A6 Comment(s): None (v4.5.0.0)

## 2023-11-19 ENCOUNTER — Inpatient Hospital Stay (HOSPITAL_BASED_OUTPATIENT_CLINIC_OR_DEPARTMENT_OTHER): Admitting: Hematology

## 2023-11-19 ENCOUNTER — Inpatient Hospital Stay: Admitting: Hematology

## 2023-11-19 VITALS — BP 141/68 | HR 80 | Temp 96.6°F | Resp 20 | Wt 183.1 lb

## 2023-11-19 DIAGNOSIS — Z171 Estrogen receptor negative status [ER-]: Secondary | ICD-10-CM | POA: Diagnosis not present

## 2023-11-19 DIAGNOSIS — C50311 Malignant neoplasm of lower-inner quadrant of right female breast: Secondary | ICD-10-CM | POA: Diagnosis not present

## 2023-11-19 NOTE — Progress Notes (Signed)
 [facility] [address], [place] [address]   Clinic Day:  11/19/2023  Referring physician: Towanda Fret, MD  Patient Care Team: Mallory Fret, MD as PCP - General Mallory Rival Davida Espy, MD as PCP - Cardiology (Cardiology) Mallory Jubilee, MD (Inactive) as Consulting Physician (Gastroenterology) Mallory Boros, MD as Medical Oncologist (Medical Oncology) Mallory Knuckles, RN as Oncology Nurse Navigator (Medical Oncology) Pappayliou, Mallory Begin, DO as Consulting Physician (General Surgery)   ASSESSMENT & PLAN:   Assessment:  1.  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  0.5 cm right axillary lymph node which appears to have lost its fatty hilum and another right axillary lymph node with upper limits of normal cortical thickness 3.7 mm. - Right breast mass biopsy (09/09/2023): Invasive poorly differentiated ductal adenocarcinoma, grade 3, ER/PR negative, HER2 (0+), Ki 67: 30%.  Lymph node could not be biopsied as it was not visible on ultrasound. - PET scan (09/25/2023): Mild metabolic activity in the medial right breast with no evidence of metastatic disease. - Right breast lumpectomy and SLNB on 10/22/2023 - Pathology: 2.1 cm IDC, grade 3, with extensive necrosis, associated DCIS, intermediate grade, margins negative.  LVI negative.  0/7 sentinel lymph nodes involved.  2. Social/Family History: -Lives at home with her granddaughter. Retired from [employer]. No tobacco use. No ETOH consumption. No chemical exposures.  -2 Maternal aunts had breast cancer. Maternal uncle had prostate cancer. Maternal niece had breast cancer.   Plan:  1.  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 - We have reviewed pathology reports in detail.  She already has port placed. - Discussed the rationale for recommending adjuvant chemotherapy to decrease chance of recurrence and improve survival. - Given her age and lymph node negative status, I have recommended nonanthracycline-based chemotherapy with 4 cycles of docetaxel and cyclophosphamide (TC) with growth factor support. - We have given literature about the medications.  We discussed side effects in detail.  She will tentatively start during the week of 5/12. - We will try to get on pro device for her growth factor as she lives with her son 45 minutes away. - I will see her back prior to cycle 2-day 1.  2.  Peripheral neuropathy: - She has tingling in fingers of both hands from carpal tunnel syndrome.  She does not have any tingling in the lower extremities.  Will closely monitor.   No orders of the defined types were placed in this encounter.     [name],acting as a Neurosurgeon for Mallory Boros, MD.,have documented all relevant documentation on the behalf of Mallory Boros, MD,as directed by  Mallory Boros, MD while in the presence of Mallory Boros, MD.  I, Mallory Boros MD, have reviewed the above documentation for accuracy and completeness, and I agree with the above.    Mallory Boros, MD   4/30/20259:30 AM  CHIEF COMPLAINT/PURPOSE OF CONSULT:   Diagnosis: Right breast triple negative breast cancer.   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  VUS in POLE at  p.T41M (c.122C>T).  Report date is 10/09/2023.   The CancerNext-Expanded gene panel offered by [facility] and includes sequencing, rearrangement, and RNA analysis for the following 76 genes: AIP, ALK, APC, ATM, AXIN2, BAP1, BARD1, BMPR1A, BRCA1, BRCA2, BRIP1, CDC73, CDH1, CDK4, CDKN1B, CDKN2A, CEBPA, CHEK2, CTNNA1, DDX41, DICER1, ETV6, FH, FLCN, GATA2, LZTR1, MAX, MBD4, MEN1, MET, MLH1, MSH2, MSH3, MSH6, MUTYH, NF1, NF2, NTHL1, PALB2, PHOX2B, PMS2, POT1, PRKAR1A, PTCH1, PTEN, RAD51C, RAD51D, RB1, RET, RUNX1, SDHA, SDHAF2, SDHB, SDHC, SDHD, SMAD4, SMARCA4, SMARCB1, SMARCE1, STK11, SUFU, TMEM127, TP53, TSC1, TSC2, VHL, and WT1 (sequencing and deletion/duplication); EGFR, HOXB13, KIT, MITF, PDGFRA, POLD1, and POLE (sequencing only); EPCAM and GREM1 (deletion/duplication only).        [name] is a 78 y.o. female presenting to clinic today for evaluation of triple negative right breast at the request of Mallory Fret, MD.  Patient underwent bilateral breast MM on 07/07/23 that found: In the right breast, a mass warrants further evaluation. In the left breast, no findings suspicious for malignancy. Right breast diagnostic MM and US  was done on 08/26/23 that showed: 1.8 cm suspicious mass within the lower inner RIGHT breast. Two slightly abnormal appearing RIGHT axillary lymph nodes.  [name] then had a biopsy of the 4 o'clock mass in the right breast on 09/09/23 that revealed: Invasive poorly differentiated ductal adenocarcinoma with extensive necrosis, grade 3. The mass was ER-/PR-/Her2- (0+).  Today, she states that she is doing well overall. Her appetite level is at 100%. Her energy level is at 50%. She is accompanied by her brother and her son and daughter on the phone.   [name] had menarche around age 89 and menopause in her  early 77's. She was given some sort of medication for a year to help with hot flashes. She is unaware if she has taken hormone replacements. [name] took birth control for 2-3 years. She has 2 children with her first childbirth at age 53.   Joani denies any unintentional weight loss, new onset pains, or peripheral neuropathy. She has no prior history of breast biopsy. She has heart flutters, but denies any MI's, TIA's, or CVA's. Back pain is chronic.   Dechelle reports intermittent ankle swellings, she attributes to 2 total knee arthroplasties in 2005.   INTERVAL HISTORY:   [name] is a 78 y.o. female presenting to the clinic today for follow-up of triple negative right breast cancer. She was last seen by me on 09/16/23 in consultation.  Since her last visit, she was seen by Dr. Cherilyn Corn on 10/01/23 and has a right breast lumpectomy scheduled on 10/22/23.   Juwairiyah had an initial PET on 09/25/23.   Today, she states that she is doing well overall. Her appetite level is at 100%. Her energy level is at 50%. She is accompanied by a family member.   PAST MEDICAL HISTORY:   Past Medical History: 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   Depression    Diverticulosis of colon  GERD (gastroesophageal reflux disease)    Hyperlipidemia    Hypertension    Obesity    Osteoarthritis of knees, bilateral    Sleep apnea     Surgical History: Past Surgical History:  Procedure Laterality Date   ABDOMINAL HYSTERECTOMY     AXILLARY SENTINEL NODE BIOPSY Right 10/22/2023   Procedure: BIOPSY, LYMPH NODE, SENTINEL, AXILLARY;  Surgeon: Marijo Shove, DO;  Location: [facility];  Service: General;  Laterality: Right;   BREAST BIOPSY Right 09/09/2023   US  RT BREAST BX W LOC DEV 1ST LESION IMG BX SPEC US  GUIDE 09/09/2023 Alger Infield, MD [facility]-ULTRAS[facility]   BREAST LUMPECTOMY WITH RADIO FREQUENCY LOCALIZER Right 10/22/2023   Procedure: BREAST  LUMPECTOMY WITH RADIO FREQUENCY LOCALIZER;  Surgeon: Marijo Shove, DO;  Location: [facility];  Service: General;  Laterality: Right;   COLONOSCOPY  07/23/2003   Dr. Fredericka James Smith:numerous large scattered diverticula   COLONOSCOPY N/A 11/15/2013   Dr. Nolene Baumgarten: moderate diverticula, small internal hemorrhoids, redudant colon. Next TCS in 2025 with overtube.    ESOPHAGOGASTRODUODENOSCOPY (EGD) WITH ESOPHAGEAL DILATION N/A 11/15/2013   Dr. Nolene Baumgarten: stricture at GE junction s/p dilation. moderate erosive gastritis, negative H.pylori   MULTIPLE TOOTH EXTRACTIONS  2024   PORTACATH PLACEMENT Left 10/22/2023   Procedure: INSERTION, TUNNELED CENTRAL VENOUS DEVICE, WITH PORT;  Surgeon: Marijo Shove, DO;  Location: [facility];  Service: General;  Laterality: Left;   TOTAL KNEE ARTHROPLASTY  06/01/2004   left / Dr. Phyllis Breeze   TOTAL KNEE ARTHROPLASTY  11/29/2003   right / Dr. Phyllis Breeze   TUBAL LIGATION      Social History: Social History   Socioeconomic History   Marital status: Widowed    Spouse name: Thomasina Holleman   Number of children: 2   Years of education: 12   Highest education level: 12th grade  Occupational History   Occupation: retired     Associate Professor: UNEMPLOYED  Tobacco Use   Smoking status: Never    Passive exposure: Never   Smokeless tobacco: Never  Vaping Use   Vaping status: Never Used  Substance and Sexual Activity   Alcohol use: No   Drug use: No   Sexual activity: Not Currently    Partners: Male    Birth control/protection: Surgical  Other Topics Concern   Not on file  Social History Narrative   Not on file   Social Drivers of Health   Financial Resource Strain: Low Risk  (08/12/2023)   Overall Financial Resource Strain (CARDIA)    Difficulty of Paying Living Expenses: Not hard at all  Food Insecurity: No Food Insecurity (08/12/2023)   Hunger Vital Sign    Worried About Running Out of Food in the Last Year: Never true    Ran Out of Food in the Last Year:  Never true  Transportation Needs: No Transportation Needs (08/12/2023)   PRAPARE - Administrator, Civil Service (Medical): No    Lack of Transportation (Non-Medical): No  Physical Activity: Sufficiently Active (08/12/2023)   Exercise Vital Sign    Days of Exercise per Week: 5 days    Minutes of Exercise per Session: 50 min  Stress: No Stress Concern Present (08/12/2023)   [employer] of Occupational Health - Occupational Stress Questionnaire    Feeling of Stress : Only a little  Social Connections: Moderately Integrated (08/12/2023)   Social Connection and Isolation Panel [NHANES]    Frequency of Communication with Friends and Family: More than three times a week  Frequency of Social Gatherings with Friends and Family: More than three times a week    Attends Religious Services: More than 4 times per year    Active Member of Clubs or Organizations: Yes    Attends Banker Meetings: More than 4 times per year    Marital Status: Widowed  Intimate Partner Violence: Not At Risk (08/12/2023)   Humiliation, Afraid, Rape, and Kick questionnaire    Fear of Current or Ex-Partner: No    Emotionally Abused: No    Physically Abused: No    Sexually Abused: No    Family History: Family History  Problem Relation Age of Onset   Diabetes Mother    Hypertension Mother    Heart disease Mother    Aneurysm Father    Diabetes Father    Hypertension Brother    Diabetes Brother    Diabetes Brother    Hypertension Brother    Breast cancer Maternal Aunt        dx >50   Cancer Maternal Aunt        unknown type; ? breast; dx >50?   Prostate cancer Maternal Uncle    Hypertension Son     Current Medications:  Current Outpatient Medications:    albuterol  (VENTOLIN  HFA) 108 (90 Base) MCG/ACT inhaler, Inhale 2 puffs into the lungs every 6 (six) hours as needed for wheezing or shortness of breath., Disp: 8.5 each, Rfl: 1   amLODipine  (NORVASC ) 10 MG tablet, TAKE 1 TABLET  BY MOUTH DAILY, Disp: 100 tablet, Rfl: 2   aspirin EC 81 MG tablet, Take 81 mg by mouth daily. Swallow whole., Disp: , Rfl:    atorvastatin  (LIPITOR) 20 MG tablet, Take 1 tablet (20 mg total) by mouth daily., Disp: 90 tablet, Rfl: 3   BIOTIN PO, Take 1 tablet by mouth daily., Disp: , Rfl:    Cholecalciferol (VITAMIN D3) 125 MCG (5000 UT) CAPS, Take 1 capsule (5,000 Units total) by mouth daily., Disp: 30 capsule, Rfl: 11   cloNIDine  (CATAPRES ) 0.2 MG tablet, Take 1 tablet (0.2 mg total) by mouth daily., Disp: 90 tablet, Rfl: 0   clotrimazole -betamethasone  (LOTRISONE ) cream, Apply cream twice daily to rash in groin for 10 days, then as needed (Patient not taking: Reported on 10/16/2023), Disp: 45 g, Rfl: 1   docusate sodium  (COLACE) 100 MG capsule, Take 1 capsule (100 mg total) by mouth 2 (two) times daily., Disp: 60 capsule, Rfl: 2   famotidine (PEPCID) 10 MG tablet, Take 10 mg by mouth 2 (two) times daily., Disp: , Rfl:    furosemide  (LASIX ) 40 MG tablet, TAKE 1 TABLET BY MOUTH DAILY AS NEEDED FOR EDEMA OR FLUID., Disp: 30 tablet, Rfl: 2   gabapentin  (NEURONTIN ) 100 MG capsule, Take 1 capsule (100 mg total) by mouth at bedtime., Disp: 30 capsule, Rfl: 3   gabapentin  (NEURONTIN ) 300 MG capsule, TAKE 1 CAPSULE BY MOUTH AT  BEDTIME (Patient not taking: Reported on 10/16/2023), Disp: 100 capsule, Rfl: 2   Hydrocortisone (GERHARDT'S BUTT CREAM) CREA, Apply 1 application topically 3 (three) times daily., Disp: 1 each, Rfl: 1   losartan -hydrochlorothiazide  (HYZAAR) 100-25 MG tablet, Take 1 tablet by mouth daily., Disp: 30 tablet, Rfl: 5   magnesium  30 MG tablet, Take one tablet by mouth once daily, Disp: 30 tablet, Rfl: 2   Melatonin 3 MG CAPS, Take 1 capsule (3 mg total) by mouth at bedtime., Disp: 30 capsule, Rfl: 3   metoprolol  succinate (TOPROL -XL) 50 MG 24 hr tablet, TAKE 1  TABLET BY MOUTH  DAILY WITH OR IMMEDIATELY  FOLLOWING A MEAL, Disp: 100 tablet, Rfl: 2   olopatadine  (PATANOL) 0.1 % ophthalmic  solution, Place 1 drop into both eyes 2 (two) times daily., Disp: 5 mL, Rfl: 6   omeprazole  (PRILOSEC) 20 MG capsule, Take 1 capsule (20 mg total) by mouth daily., Disp: 100 capsule, Rfl: 2   ondansetron  (ZOFRAN ) 4 MG tablet, Take 1 tablet (4 mg total) by mouth daily as needed for nausea or vomiting., Disp: 30 tablet, Rfl: 1   oxyCODONE  (ROXICODONE ) 5 MG immediate release tablet, Take 1 tablet (5 mg total) by mouth every 6 (six) hours as needed., Disp: 8 tablet, Rfl: 0   potassium chloride  SA (KLOR-CON  M) 20 MEQ tablet, Take 1 tablet (20 mEq total) by mouth daily., Disp: 100 tablet, Rfl: 2   pyridOXINE  (VITAMIN B-6) 100 MG tablet, Take 200 mg by mouth daily., Disp: , Rfl:    sertraline  (ZOLOFT ) 50 MG tablet, Take 1 tablet (50 mg total) by mouth daily., Disp: 30 tablet, Rfl: 3   temazepam  (RESTORIL ) 15 MG capsule, Take 1 capsule (15 mg total) by mouth at bedtime as needed for sleep., Disp: 30 capsule, Rfl: 5   Allergies: No Known Allergies  REVIEW OF SYSTEMS:   Review of Systems  Constitutional:  Negative for chills, fatigue and fever.  HENT:   Negative for lump/mass, mouth sores, nosebleeds, sore throat and trouble swallowing.   Eyes:  Negative for eye problems.  Respiratory:  Negative for cough.   Cardiovascular:  Negative for chest pain, leg swelling and palpitations.  Gastrointestinal:  Negative for abdominal pain, constipation, diarrhea, nausea and vomiting.  Genitourinary:  Negative for bladder incontinence, difficulty urinating, dysuria, frequency, hematuria and nocturia.   Musculoskeletal:  Negative for arthralgias, back pain, flank pain, myalgias and neck pain.  Skin:  Negative for itching and rash.  Neurological:  Negative for headaches and numbness.  Hematological:  Does not bruise/bleed easily.  Psychiatric/Behavioral:  Positive for sleep disturbance. Negative for depression and suicidal ideas. The patient is not nervous/anxious.   All other systems reviewed and are negative.     VITALS:   There were no vitals taken for this visit.  Wt Readings from Last 3 Encounters:  11/18/23 182 lb (82.6 kg)  10/22/23 185 lb 3 oz (84 kg)  10/20/23 186 lb (84.4 kg)    There is no height or weight on file to calculate BMI.  Performance status (ECOG): 1 - Symptomatic but completely ambulatory  PHYSICAL EXAM:   Physical Exam Vitals and nursing note reviewed. Exam conducted with a chaperone present.  Constitutional:      Appearance: Normal appearance.  Cardiovascular:     Rate and Rhythm: Normal rate and regular rhythm.     Pulses: Normal pulses.     Heart sounds: Normal heart sounds.  Pulmonary:     Effort: Pulmonary effort is normal.     Breath sounds: Normal breath sounds.  Abdominal:     Palpations: Abdomen is soft. There is no hepatomegaly, splenomegaly or mass.     Tenderness: There is no abdominal tenderness.  Musculoskeletal:     Right lower leg: No edema.     Left lower leg: No edema.  Lymphadenopathy:     Cervical: No cervical adenopathy.     Right cervical: No superficial, deep or posterior cervical adenopathy.    Left cervical: No superficial, deep or posterior cervical adenopathy.     Upper Body:     Right upper  body: No supraclavicular or axillary adenopathy.     Left upper body: No supraclavicular or axillary adenopathy.  Neurological:     General: No focal deficit present.     Mental Status: She is alert and oriented to person, place, and time.  Psychiatric:        Mood and Affect: Mood normal.        Behavior: Behavior normal.  Breast Exam Chaperone: Franco Isaac, RN   LABS:   CBC    Component Value Date/Time   WBC 9.3 09/16/2023 1357   RBC 3.87 09/16/2023 1357   HGB 11.3 (L) 09/16/2023 1357   HGB 12.0 01/31/2023 1145   HCT 36.1 09/16/2023 1357   HCT 37.0 01/31/2023 1145   PLT 243 09/16/2023 1357   PLT 204 01/31/2023 1145   MCV 93.3 09/16/2023 1357   MCV 92 01/31/2023 1145   MCH 29.2 09/16/2023 1357   MCHC 31.3 09/16/2023 1357    RDW 15.2 09/16/2023 1357   RDW 12.6 01/31/2023 1145   LYMPHSABS 2.5 09/16/2023 1357   LYMPHSABS 5.4 (H) 12/12/2022 1033   MONOABS 0.6 09/16/2023 1357   EOSABS 0.2 09/16/2023 1357   EOSABS 0.2 12/12/2022 1033   BASOSABS 0.1 09/16/2023 1357   BASOSABS 0.1 12/12/2022 1033    CMP    Component Value Date/Time   NA 140 10/20/2023 1041   NA 143 06/27/2023 1155   K 4.0 10/20/2023 1041   CL 103 10/20/2023 1041   CO2 27 10/20/2023 1041   GLUCOSE 103 (H) 10/20/2023 1041   BUN 16 10/20/2023 1041   BUN 17 06/27/2023 1155   CREATININE 0.89 10/20/2023 1041   CREATININE 0.78 02/15/2020 0848   CALCIUM  9.4 10/20/2023 1041   PROT 7.2 09/16/2023 1357   PROT 7.6 06/27/2023 1155   ALBUMIN 3.4 (L) 09/16/2023 1357   ALBUMIN 4.1 06/27/2023 1155   AST 19 09/16/2023 1357   ALT 12 09/16/2023 1357   ALKPHOS 58 09/16/2023 1357   BILITOT 0.9 09/16/2023 1357   BILITOT 0.6 06/27/2023 1155   GFRNONAA >60 10/20/2023 1041   GFRNONAA 76 02/15/2020 0848   GFRAA 89 08/15/2020 0931   GFRAA 88 02/15/2020 0848     No results found for: "CEA1", "CEA" / No results found for: "CEA1", "CEA" No results found for: "PSA1" No results found for: "ZOX096" No results found for: "CAN125"  No results found for: "TOTALPROTELP", "ALBUMINELP", "A1GS", "A2GS", "BETS", "BETA2SER", "GAMS", "MSPIKE", "SPEI" No results found for: "TIBC", "FERRITIN", "IRONPCTSAT" No results found for: "LDH"   STUDIES:   MM Breast Surgical Specimen Result Date: 10/23/2023 CLINICAL DATA:  Status post radiofrequency tag localized right breast lumpectomy. EXAM: SPECIMEN RADIOGRAPH OF THE RIGHT BREAST COMPARISON:  Previous exam(s). FINDINGS: Status post excision of the right breast. The radiofrequency tag and ribbon shaped clip are present within the specimen. IMPRESSION: Specimen radiograph of the right breast. Electronically Signed   By: Alger Infield M.D.   On: 10/23/2023 07:29   DG Chest Port 1 View Result Date: 10/22/2023 CLINICAL DATA:   Port-A-Cath placement. EXAM: PORTABLE CHEST 1 VIEW COMPARISON:  January 29, 2021. FINDINGS: Mild cardiomegaly is noted with central pulmonary vascular congestion. Left internal jugular Port-A-Cath is noted with distal tip in expected position of the SVC. No pneumothorax is noted. Elevated right hemidiaphragm is noted. Bibasilar subsegmental atelectasis is noted with minimal left pleural effusion. IMPRESSION: Left internal jugular Port-A-Cath is noted with distal tip in expected position of the SVC. Mild cardiomegaly with central pulmonary vascular congestion. Bibasilar  subsegmental atelectasis is noted with minimal left pleural effusion. Electronically Signed   By: Rosalene Colon M.D.   On: 10/22/2023 12:04   DG C-Arm 1-60 Min-No Report Result Date: 10/22/2023 Fluoroscopy was utilized by the requesting physician.  No radiographic interpretation.

## 2023-11-19 NOTE — Progress Notes (Signed)
START ON PATHWAY REGIMEN - Breast     A cycle is every 21 days:     Cyclophosphamide      Docetaxel   **Always confirm dose/schedule in your pharmacy ordering system**  Patient Characteristics: Postoperative without Neoadjuvant Therapy, M0 (Pathologic Staging), Invasive Disease, Adjuvant Therapy, HER2 Negative, ER Negative, Node Negative, pT1a-c, N42mi or pT1c or Higher, pN0 Therapeutic Status: Postoperative without Neoadjuvant Therapy, M0 (Pathologic Staging) AJCC Grade: G3 AJCC N Category: pN0 AJCC M Category: cM0 ER Status: Negative (-) AJCC 8 Stage Grouping: IIA HER2 Status: Negative (-) Oncotype Dx Recurrence Score: Not Appropriate AJCC T Category: pT2 PR Status: Negative (-) Intent of Therapy: Curative Intent, Discussed with Patient

## 2023-11-19 NOTE — Patient Instructions (Addendum)
 [facility] at [facility] Discharge Instructions   You were seen and examined today by Dr. Cheree Cords.  He reviewed the results of your biopsy. It is showing that your cancer is triple receptor negative. The only way to prevent the cancer from coming back is to treat with chemotherapy. There are two drugs you will receive. They are called Taxotere and Cytoxan. They are given once every 3 weeks for a total of 4 times. You will also receive a white blood cell booster shot. We will arrange for you to have a device placed that will administer this injection at home the day after treatment.   You will have a chemotherapy education session prior to starting treatment.   We will plan to start treatment in 2 weeks.   Return as scheduled.    Thank you for choosing  [facility] at [facility] to provide your oncology and hematology care.  To afford each patient quality time with our provider, please arrive at least 15 minutes before your scheduled appointment time.   If you have a lab appointment with the Cancer Center please come in thru the Main Entrance and check in at the main information desk.  You need to re-schedule your appointment should you arrive 10 or more minutes late.  We strive to give you quality time with our providers, and arriving late affects you and other patients whose appointments are after yours.  Also, if you no show three or more times for appointments you may be dismissed from the clinic at the providers discretion.     Again, thank you for choosing [facility].  Our hope is that these requests will decrease the amount of time that you wait before being seen by our physicians.       _____________________________________________________________  Should you have questions after your visit to [facility], please contact our office at [phone] and follow the prompts.  Our office hours are 8:00 a.m. and  4:30 p.m. Monday - Friday.  Please note that voicemails left after 4:00 p.m. may not be returned until the following business day.  We are closed weekends and major holidays.  You do have access to a nurse 24-7, just call the main number to the clinic [phone] and do not press any options, hold on the line and a nurse will answer the phone.    For prescription refill requests, have your pharmacy contact our office and allow 72 hours.    Due to Covid, you will need to wear a mask upon entering the hospital. If you do not have a mask, a mask will be given to you at the Main Entrance upon arrival. For doctor visits, patients may have 1 support person age 46 or older with them. For treatment visits, patients can not have anyone with them due to social distancing guidelines and our immunocompromised population.

## 2023-11-19 NOTE — Progress Notes (Addendum)
 [facility] [address], [place] [address]   Clinic Day:  11/19/2023  Referring physician: Towanda Fret, MD  Patient Care Team: Towanda Fret, MD as PCP - General [name], MD as PCP - Cardiology (Cardiology) Alyce Jubilee, MD (Inactive) as Consulting Physician (Gastroenterology) [name], MD as Medical Oncologist (Medical Oncology) Gerhard Knuckles, RN as Oncology Nurse Navigator (Medical Oncology) Pappayliou, Arie Begin, DO as Consulting Physician (General Surgery)   ASSESSMENT & PLAN:   Assessment:  1.  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  0.5 cm right axillary lymph node which appears to have lost its fatty hilum and another right axillary lymph node with upper limits of normal cortical thickness 3.7 mm. - Right breast mass biopsy (09/09/2023): Invasive poorly differentiated ductal adenocarcinoma, grade 3, ER/PR negative, HER2 (0+), Ki 67: 30%.  Lymph node could not be biopsied as it was not visible on ultrasound. - PET scan (09/25/2023): Mild metabolic activity in the medial right breast with no evidence of metastatic disease.  2. Social/Family History: -Lives at home with her granddaughter. Retired from [employer]. No tobacco use. No ETOH consumption. No chemical exposures.  -2 Maternal aunts had breast cancer. Maternal uncle had prostate cancer. Maternal niece had breast cancer.   Plan:  1.  Clinical stage II (T1c N 0/1) TNBC of the right breast LIQ: - We reviewed results of PET scan which did not show any metastatic disease. - Tumor markers and LFTs are also within normal limits. - She may proceed with tag placement on 10/14/2023.  At that time they will also re-look for any  positive lymph nodes on ultrasound and biopsy them.  She will have lumpectomy and SLNB on 10/22/2023. - We have discussed role of adjuvant chemotherapy after surgery.  She will also have a port placed at the time of lumpectomy. - Will talk to her about genetic testing at next visit. - RTC 4 weeks after surgery.   No orders of the defined types were placed in this encounter.     [name],acting as a Neurosurgeon for [name], MD.,have documented all relevant documentation on the behalf of [name], MD,as directed by  [name], MD while in the presence of [name], MD.  I, [name] MD, have reviewed the above documentation for accuracy and completeness, and I agree with the above.    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  VUS in POLE at  p.T41M (c.122C>T).  Report date is 10/09/2023.   The CancerNext-Expanded gene panel  offered by [employer] and includes sequencing, rearrangement, and RNA analysis for the following 76 genes: AIP, ALK, APC, ATM, AXIN2, BAP1, BARD1, BMPR1A, BRCA1, BRCA2, BRIP1, CDC73, CDH1, CDK4, CDKN1B, CDKN2A, CEBPA, CHEK2, CTNNA1, DDX41, DICER1, ETV6, FH, FLCN, GATA2, LZTR1, MAX, MBD4, MEN1, MET,  MLH1, MSH2, MSH3, MSH6, MUTYH, NF1, NF2, NTHL1, PALB2, PHOX2B, PMS2, POT1, PRKAR1A, PTCH1, PTEN, RAD51C, RAD51D, RB1, RET, RUNX1, SDHA, SDHAF2, SDHB, SDHC, SDHD, SMAD4, SMARCA4, SMARCB1, SMARCE1, STK11, SUFU, TMEM127, TP53, TSC1, TSC2, VHL, and WT1 (sequencing and deletion/duplication); EGFR, HOXB13, KIT, MITF, PDGFRA, POLD1, and POLE (sequencing only); EPCAM and GREM1 (deletion/duplication only).    11/19/2023 Cancer Staging   Staging form: Breast, AJCC 8th Edition - Pathologic stage from 11/19/2023: Stage IIA (pT2, pN0(sn), cM0, G3, ER-, PR-, HER2-) - Signed by [name], MD on 11/19/2023 Histopathologic type: Infiltrating duct carcinoma, NOS Stage prefix: Initial diagnosis Method of lymph node assessment: Sentinel lymph node biopsy Nuclear grade: G3 Multigene prognostic tests performed: None Histologic grading system: 3 grade system Residual tumor (R): R0       [name] is a 78 y.o. female presenting to clinic today for evaluation of triple negative right breast at the request of Towanda Fret, MD.  Patient underwent bilateral breast MM on 07/07/23 that found: In the right breast, a mass warrants further evaluation. In the left breast, no findings suspicious for malignancy. Right breast diagnostic MM and US  was done on 08/26/23 that showed: 1.8 cm suspicious mass within the lower inner RIGHT breast. Two slightly abnormal appearing RIGHT axillary lymph nodes.  [name] then had a biopsy of the 4 o'clock mass in the right breast on 09/09/23 that revealed: Invasive poorly differentiated ductal adenocarcinoma with extensive necrosis, grade 3. The mass was ER-/PR-/Her2- (0+).  Today, she states that she is doing well overall. Her appetite level is at 100%. Her energy level is at 50%. She is accompanied by her brother and her son and daughter on the phone.   [name] had menarche around age 9 and menopause in her early 44's. She was given some sort of medication for a year to help with hot  flashes. She is unaware if she has taken hormone replacements. [name] took birth control for 2-3 years. She has 2 children with her first childbirth at age 5.   [name] denies any unintentional weight loss, new onset pains, or peripheral neuropathy. She has no prior history of breast biopsy. She has heart flutters, but denies any MI's, TIA's, or CVA's. Back pain is chronic.   [name] reports intermittent ankle swellings, she attributes to 2 total knee arthroplasties in 2005.   INTERVAL HISTORY:   [name] is a 78 y.o. female presenting to the clinic today for follow-up of triple negative right breast cancer. She was last seen by me on 10/02/23.  Since her last visit, she underwent right breast lumpectomy by Dr. Cherilyn Corn on 10/22/23 and had post-op follow-up yesterday. Pathology of the lumpectomy showed: grade 3 invasive ductal carcinoma, 2.1 cm in size, with extensive necrosis. All 7 lymph nodes were negative for metastatic carcinoma. Pathologic staging is pT2, pN0.   Today, she states that she is doing well overall. Her appetite level is at 100%. Her energy level is at 50%. She is accompanied by a family member.   PAST MEDICAL HISTORY:   Past Medical History: 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   Depression    Diverticulosis  of colon    GERD (gastroesophageal reflux disease)    Hyperlipidemia    Hypertension    Obesity    Osteoarthritis of knees, bilateral    Sleep apnea     Surgical History: Past Surgical History:  Procedure Laterality Date   ABDOMINAL HYSTERECTOMY     AXILLARY SENTINEL NODE BIOPSY Right 10/22/2023   Procedure: BIOPSY, LYMPH NODE, SENTINEL, AXILLARY;  Surgeon: Marijo Shove, DO;  Location: [facility];  Service: General;  Laterality: Right;   BREAST BIOPSY Right 09/09/2023   US  RT BREAST BX W LOC DEV 1ST LESION IMG BX SPEC US  GUIDE 09/09/2023 Alger Infield, MD [facility]-ULTRASOUND   BREAST  LUMPECTOMY WITH RADIO FREQUENCY LOCALIZER Right 10/22/2023   Procedure: BREAST LUMPECTOMY WITH RADIO FREQUENCY LOCALIZER;  Surgeon: Marijo Shove, DO;  Location: [facility];  Service: General;  Laterality: Right;   COLONOSCOPY  07/23/2003   Dr. Fredericka James Smith:numerous large scattered diverticula   COLONOSCOPY N/A 11/15/2013   Dr. Nolene Baumgarten: moderate diverticula, small internal hemorrhoids, redudant colon. Next TCS in 2025 with overtube.    ESOPHAGOGASTRODUODENOSCOPY (EGD) WITH ESOPHAGEAL DILATION N/A 11/15/2013   Dr. Nolene Baumgarten: stricture at GE junction s/p dilation. moderate erosive gastritis, negative H.pylori   MULTIPLE TOOTH EXTRACTIONS  2024   PORTACATH PLACEMENT Left 10/22/2023   Procedure: INSERTION, TUNNELED CENTRAL VENOUS DEVICE, WITH PORT;  Surgeon: Marijo Shove, DO;  Location: [facility];  Service: General;  Laterality: Left;   TOTAL KNEE ARTHROPLASTY  06/01/2004   left / Dr. Phyllis Breeze   TOTAL KNEE ARTHROPLASTY  11/29/2003   right / Dr. Phyllis Breeze   TUBAL LIGATION      Social History: Social History   Socioeconomic History   Marital status: Widowed    Spouse name: Olivianna Halas   Number of children: 2   Years of education: 12   Highest education level: 12th grade  Occupational History   Occupation: retired     Associate Professor: UNEMPLOYED  Tobacco Use   Smoking status: Never    Passive exposure: Never   Smokeless tobacco: Never  Vaping Use   Vaping status: Never Used  Substance and Sexual Activity   Alcohol use: No   Drug use: No   Sexual activity: Not Currently    Partners: Male    Birth control/protection: Surgical  Other Topics Concern   Not on file  Social History Narrative   Not on file   Social Drivers of Health   Financial Resource Strain: Low Risk  (08/12/2023)   Overall Financial Resource Strain (CARDIA)    Difficulty of Paying Living Expenses: Not hard at all  Food Insecurity: No Food Insecurity (08/12/2023)   Hunger Vital Sign    Worried About Running  Out of Food in the Last Year: Never true    Ran Out of Food in the Last Year: Never true  Transportation Needs: No Transportation Needs (08/12/2023)   PRAPARE - Administrator, Civil Service (Medical): No    Lack of Transportation (Non-Medical): No  Physical Activity: Sufficiently Active (08/12/2023)   Exercise Vital Sign    Days of Exercise per Week: 5 days    Minutes of Exercise per Session: 50 min  Stress: No Stress Concern Present (08/12/2023)   [employer] of Occupational Health - Occupational Stress Questionnaire    Feeling of Stress : Only a little  Social Connections: Moderately Integrated (08/12/2023)   Social Connection and Isolation Panel [NHANES]    Frequency of Communication with Friends and Family: More than three  times a week    Frequency of Social Gatherings with Friends and Family: More than three times a week    Attends Religious Services: More than 4 times per year    Active Member of Clubs or Organizations: Yes    Attends Banker Meetings: More than 4 times per year    Marital Status: Widowed  Intimate Partner Violence: Not At Risk (08/12/2023)   Humiliation, Afraid, Rape, and Kick questionnaire    Fear of Current or Ex-Partner: No    Emotionally Abused: No    Physically Abused: No    Sexually Abused: No    Family History: Family History  Problem Relation Age of Onset   Diabetes Mother    Hypertension Mother    Heart disease Mother    Aneurysm Father    Diabetes Father    Hypertension Brother    Diabetes Brother    Diabetes Brother    Hypertension Brother    Breast cancer Maternal Aunt        dx >50   Cancer Maternal Aunt        unknown type; ? breast; dx >50?   Prostate cancer Maternal Uncle    Hypertension Son     Current Medications:  Current Outpatient Medications:    albuterol  (VENTOLIN  HFA) 108 (90 Base) MCG/ACT inhaler, Inhale 2 puffs into the lungs every 6 (six) hours as needed for wheezing or shortness of  breath., Disp: 8.5 each, Rfl: 1   amLODipine  (NORVASC ) 10 MG tablet, TAKE 1 TABLET BY MOUTH DAILY, Disp: 100 tablet, Rfl: 2   aspirin EC 81 MG tablet, Take 81 mg by mouth daily. Swallow whole., Disp: , Rfl:    atorvastatin  (LIPITOR) 20 MG tablet, Take 1 tablet (20 mg total) by mouth daily., Disp: 90 tablet, Rfl: 3   BIOTIN PO, Take 1 tablet by mouth daily., Disp: , Rfl:    Cholecalciferol (VITAMIN D3) 125 MCG (5000 UT) CAPS, Take 1 capsule (5,000 Units total) by mouth daily., Disp: 30 capsule, Rfl: 11   cloNIDine  (CATAPRES ) 0.2 MG tablet, Take 1 tablet (0.2 mg total) by mouth daily., Disp: 90 tablet, Rfl: 0   clotrimazole -betamethasone  (LOTRISONE ) cream, Apply cream twice daily to rash in groin for 10 days, then as needed (Patient not taking: Reported on 10/16/2023), Disp: 45 g, Rfl: 1   docusate sodium  (COLACE) 100 MG capsule, Take 1 capsule (100 mg total) by mouth 2 (two) times daily., Disp: 60 capsule, Rfl: 2   famotidine (PEPCID) 10 MG tablet, Take 10 mg by mouth 2 (two) times daily., Disp: , Rfl:    furosemide  (LASIX ) 40 MG tablet, TAKE 1 TABLET BY MOUTH DAILY AS NEEDED FOR EDEMA OR FLUID., Disp: 30 tablet, Rfl: 2   gabapentin  (NEURONTIN ) 100 MG capsule, Take 1 capsule (100 mg total) by mouth at bedtime., Disp: 30 capsule, Rfl: 3   gabapentin  (NEURONTIN ) 300 MG capsule, TAKE 1 CAPSULE BY MOUTH AT  BEDTIME (Patient not taking: Reported on 10/16/2023), Disp: 100 capsule, Rfl: 2   Hydrocortisone (GERHARDT'S BUTT CREAM) CREA, Apply 1 application topically 3 (three) times daily., Disp: 1 each, Rfl: 1   losartan -hydrochlorothiazide  (HYZAAR) 100-25 MG tablet, Take 1 tablet by mouth daily., Disp: 30 tablet, Rfl: 5   magnesium  30 MG tablet, Take one tablet by mouth once daily, Disp: 30 tablet, Rfl: 2   Melatonin 3 MG CAPS, Take 1 capsule (3 mg total) by mouth at bedtime., Disp: 30 capsule, Rfl: 3   metoprolol  succinate (TOPROL -XL) 50  MG 24 hr tablet, TAKE 1 TABLET BY MOUTH  DAILY WITH OR IMMEDIATELY   FOLLOWING A MEAL, Disp: 100 tablet, Rfl: 2   olopatadine  (PATANOL) 0.1 % ophthalmic solution, Place 1 drop into both eyes 2 (two) times daily., Disp: 5 mL, Rfl: 6   omeprazole  (PRILOSEC) 20 MG capsule, Take 1 capsule (20 mg total) by mouth daily., Disp: 100 capsule, Rfl: 2   ondansetron  (ZOFRAN ) 4 MG tablet, Take 1 tablet (4 mg total) by mouth daily as needed for nausea or vomiting., Disp: 30 tablet, Rfl: 1   oxyCODONE  (ROXICODONE ) 5 MG immediate release tablet, Take 1 tablet (5 mg total) by mouth every 6 (six) hours as needed., Disp: 8 tablet, Rfl: 0   potassium chloride  SA (KLOR-CON  M) 20 MEQ tablet, Take 1 tablet (20 mEq total) by mouth daily., Disp: 100 tablet, Rfl: 2   pyridOXINE  (VITAMIN B-6) 100 MG tablet, Take 200 mg by mouth daily., Disp: , Rfl:    sertraline  (ZOLOFT ) 50 MG tablet, Take 1 tablet (50 mg total) by mouth daily., Disp: 30 tablet, Rfl: 3   temazepam  (RESTORIL ) 15 MG capsule, Take 1 capsule (15 mg total) by mouth at bedtime as needed for sleep., Disp: 30 capsule, Rfl: 5   Allergies: No Known Allergies  REVIEW OF SYSTEMS:   Review of Systems  Constitutional:  Negative for chills, fatigue and fever.  HENT:   Negative for lump/mass, mouth sores, nosebleeds, sore throat and trouble swallowing.   Eyes:  Negative for eye problems.  Respiratory:  Negative for cough and shortness of breath.   Cardiovascular:  Negative for chest pain, leg swelling and palpitations.  Gastrointestinal:  Negative for abdominal pain, constipation, diarrhea, nausea and vomiting.  Genitourinary:  Negative for bladder incontinence, difficulty urinating, dysuria, frequency, hematuria and nocturia.   Musculoskeletal:  Negative for arthralgias, back pain, flank pain, myalgias and neck pain.  Skin:  Negative for itching and rash.  Neurological:  Negative for dizziness, headaches and numbness.  Hematological:  Does not bruise/bleed easily.  Psychiatric/Behavioral:  Negative for depression, sleep disturbance  and suicidal ideas. The patient is not nervous/anxious.   All other systems reviewed and are negative.    VITALS:   There were no vitals taken for this visit.  Wt Readings from Last 3 Encounters:  11/18/23 182 lb (82.6 kg)  10/22/23 185 lb 3 oz (84 kg)  10/20/23 186 lb (84.4 kg)    There is no height or weight on file to calculate BMI.  Performance status (ECOG): 1 - Symptomatic but completely ambulatory  PHYSICAL EXAM:   Physical Exam Vitals and nursing note reviewed. Exam conducted with a chaperone present.  Constitutional:      Appearance: Normal appearance.  Cardiovascular:     Rate and Rhythm: Normal rate and regular rhythm.     Pulses: Normal pulses.     Heart sounds: Normal heart sounds.  Pulmonary:     Effort: Pulmonary effort is normal.     Breath sounds: Normal breath sounds.  Abdominal:     Palpations: Abdomen is soft. There is no hepatomegaly, splenomegaly or mass.     Tenderness: There is no abdominal tenderness.  Musculoskeletal:     Right lower leg: No edema.     Left lower leg: No edema.  Lymphadenopathy:     Cervical: No cervical adenopathy.     Right cervical: No superficial, deep or posterior cervical adenopathy.    Left cervical: No superficial, deep or posterior cervical adenopathy.  Upper Body:     Right upper body: No supraclavicular or axillary adenopathy.     Left upper body: No supraclavicular or axillary adenopathy.  Neurological:     General: No focal deficit present.     Mental Status: She is alert and oriented to person, place, and time.  Psychiatric:        Mood and Affect: Mood normal.        Behavior: Behavior normal.   Breast Exam Chaperone: Franco Isaac, RN   LABS:   CBC    Component Value Date/Time   WBC 9.3 09/16/2023 1357   RBC 3.87 09/16/2023 1357   HGB 11.3 (L) 09/16/2023 1357   HGB 12.0 01/31/2023 1145   HCT 36.1 09/16/2023 1357   HCT 37.0 01/31/2023 1145   PLT 243 09/16/2023 1357   PLT 204 01/31/2023 1145    MCV 93.3 09/16/2023 1357   MCV 92 01/31/2023 1145   MCH 29.2 09/16/2023 1357   MCHC 31.3 09/16/2023 1357   RDW 15.2 09/16/2023 1357   RDW 12.6 01/31/2023 1145   LYMPHSABS 2.5 09/16/2023 1357   LYMPHSABS 5.4 (H) 12/12/2022 1033   MONOABS 0.6 09/16/2023 1357   EOSABS 0.2 09/16/2023 1357   EOSABS 0.2 12/12/2022 1033   BASOSABS 0.1 09/16/2023 1357   BASOSABS 0.1 12/12/2022 1033    CMP    Component Value Date/Time   NA 140 10/20/2023 1041   NA 143 06/27/2023 1155   K 4.0 10/20/2023 1041   CL 103 10/20/2023 1041   CO2 27 10/20/2023 1041   GLUCOSE 103 (H) 10/20/2023 1041   BUN 16 10/20/2023 1041   BUN 17 06/27/2023 1155   CREATININE 0.89 10/20/2023 1041   CREATININE 0.78 02/15/2020 0848   CALCIUM  9.4 10/20/2023 1041   PROT 7.2 09/16/2023 1357   PROT 7.6 06/27/2023 1155   ALBUMIN 3.4 (L) 09/16/2023 1357   ALBUMIN 4.1 06/27/2023 1155   AST 19 09/16/2023 1357   ALT 12 09/16/2023 1357   ALKPHOS 58 09/16/2023 1357   BILITOT 0.9 09/16/2023 1357   BILITOT 0.6 06/27/2023 1155   GFRNONAA >60 10/20/2023 1041   GFRNONAA 76 02/15/2020 0848   GFRAA 89 08/15/2020 0931   GFRAA 88 02/15/2020 0848     No results found for: "CEA1", "CEA" / No results found for: "CEA1", "CEA" No results found for: "PSA1" No results found for: "ZOX096" No results found for: "CAN125"  No results found for: "TOTALPROTELP", "ALBUMINELP", "A1GS", "A2GS", "BETS", "BETA2SER", "GAMS", "MSPIKE", "SPEI" No results found for: "TIBC", "FERRITIN", "IRONPCTSAT" No results found for: "LDH"   STUDIES:   MM Breast Surgical Specimen Result Date: 10/23/2023 CLINICAL DATA:  Status post radiofrequency tag localized right breast lumpectomy. EXAM: SPECIMEN RADIOGRAPH OF THE RIGHT BREAST COMPARISON:  Previous exam(s). FINDINGS: Status post excision of the right breast. The radiofrequency tag and ribbon shaped clip are present within the specimen. IMPRESSION: Specimen radiograph of the right breast. Electronically Signed   By:  Alger Infield M.D.   On: 10/23/2023 07:29   DG Chest Port 1 View Result Date: 10/22/2023 CLINICAL DATA:  Port-A-Cath placement. EXAM: PORTABLE CHEST 1 VIEW COMPARISON:  January 29, 2021. FINDINGS: Mild cardiomegaly is noted with central pulmonary vascular congestion. Left internal jugular Port-A-Cath is noted with distal tip in expected position of the SVC. No pneumothorax is noted. Elevated right hemidiaphragm is noted. Bibasilar subsegmental atelectasis is noted with minimal left pleural effusion. IMPRESSION: Left internal jugular Port-A-Cath is noted with distal tip in expected position of the  SVC. Mild cardiomegaly with central pulmonary vascular congestion. Bibasilar subsegmental atelectasis is noted with minimal left pleural effusion. Electronically Signed   By: Rosalene Colon M.D.   On: 10/22/2023 12:04   DG C-Arm 1-60 Min-No Report Result Date: 10/22/2023 Fluoroscopy was utilized by the requesting physician.  No radiographic interpretation.

## 2023-11-20 ENCOUNTER — Other Ambulatory Visit: Payer: Self-pay

## 2023-11-23 NOTE — Progress Notes (Unsigned)
 [facility] Note   HPI:  [name] presents to clinic for post-op follow-up s/p right breast lumpectomy with sentinel lymph node biopsy on 4/2. ***  Review of Systems:  All other review of systems: otherwise negative   Vital Signs:  BP (!) 172/74   Pulse (!) 58   Temp (!) 97.4 F (36.3 C) (Oral)   Resp 16   Ht 5\' 2"  (1.575 m)   Wt 182 lb (82.6 kg)   SpO2 94%   BMI 33.29 kg/m    Physical Exam:  Physical Exam  Laboratory studies: None   Imaging:  None  Pathology:  A. 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 LYMPHO NODE, SENTINILE, RIGHT, EXCISION:      One lymph node, negative for metastatic carcinoma (0/1)  C. LYMPHO NODE, ADDITIONAL, RIGHT, EXCISION:      Six lymph nodes, negative for metastatic carcinoma (0/6).  ONCOLOGY TABLE:  INVASIVE CARCINOMA OF THE BREAST:  Resection  Procedure: Lumpectomy Specimen Laterality: Right Histologic Type: Invasive ductal carcinoma Histologic Grade:      Glandular (Acinar)/Tubular Differentiation: 3      Nuclear Pleomorphism: 2      Mitotic Rate: 3      Overall Grade: 3 Tumor Size: 2.1 cm Ductal Carcinoma In Situ: Solid type, intermediated grade Lymphatic and/or Vascular Invasion:  Not identified Treatment Effect in the Breast: No known presurgical therapy Margins: All margins negative for invasive carcinoma      Distance from Closest Margin (mm): 1.5 mm from inferior margin      Specify Closest Margin (required only  if <15mm): 1.5 mm from inferior margin                                2 mm from anterior margin DCIS Margins: Uninvolved by DCIS      Distance from Closest Margin (mm): 8 mm from inferior margin      Specify Closest Margin (required only if <68mm): 8 mm from inferior margin Regional Lymph Nodes:      Number of Lymph Nodes Examined: 7      Number of Sentinel Nodes Examined: 1      Number of Lymph Nodes with Macrometastases (>2 mm): 0      Number of Lymph Nodes with Micrometastases: 0      Number of Lymph Nodes with Isolated Tumor Cells (=0.2 mm or =200 cells): 0      Size of Largest Metastatic Deposit (mm): NA      Extranodal Extension: NA Distant Metastasis:      Distant Site(s) Involved: Not applicable Breast Biomarker Testing Performed on Previous Biopsy:      Testing Performed on Case Number: [id number]            Estrogen Receptor: 0%, negative            Progesterone Receptor: 0%, negative  HER2: 0, negative            Ki-67: 30% Pathologic Stage Classification (pTNM, AJCC 8th Edition): pT2, pN0 Representative Tumor Block: A2-A6 Comment(s): None (v4.5.0.0)   Assessment:  78 y.o. yo Female who presents for follow up s/p right breast lumpectomy with sentinel lymph node biopsy on 4/2.  Plan:  - ***  - *** - *** - Follow up  All of the above recommendations were discussed with the patient and patient's family, and all of patient's and family's questions were answered to their expressed satisfaction.  Note: Portions of this report may have been transcribed using voice recognition software. Every effort has been made to ensure accuracy; however, inadvertent computerized transcription errors may still be present.   Mallory Reels, DO [facility] [address] [name] [place], [place] [address] [phone] (office)

## 2023-11-24 ENCOUNTER — Ambulatory Visit: Payer: Medicare Other | Admitting: Gastroenterology

## 2023-11-24 MED ORDER — LIDOCAINE-PRILOCAINE 2.5-2.5 % EX CREA: TOPICAL_CREAM | CUTANEOUS | 3 refills | Status: DC

## 2023-11-24 MED ORDER — PROCHLORPERAZINE MALEATE 10 MG PO TABS: 10.0000 mg | ORAL_TABLET | Freq: Four times a day (QID) | ORAL | 3 refills | Status: DC | PRN

## 2023-11-24 NOTE — Patient Instructions (Addendum)
 [facility] Chemotherapy Teaching   You have been diagnosed with Stage 2 Breast cancer by your oncologist.  You will receive the chemotherapies Taxotere and Cytoxan every 3 weeks for a total of 4 treatments.  The intent of treatment is Curative.    You will see the doctor regularly throughout treatment.  We will obtain blood work from you prior to every treatment and monitor your results to make sure it is safe to give your treatment. The doctor monitors your response to treatment by the way you are feeling, your blood work, and by obtaining scans periodically.  There will be wait times while you are here for treatment.  It will take about 30 minutes to 1 hour for your lab work to result.  Then there will be wait times while pharmacy mixes your medications.     Medications you will receive in the clinic prior to your chemotherapy medications:   Aloxi:  ALOXI is used in adults to help prevent the nausea and vomiting that happens with certain chemotherapy drugs.  Aloxi is a long acting medication, and will remain in your system for about 2 days.    Dexamethasone :  This is a steroid given prior to chemotherapy to help prevent allergic reactions; it may also help prevent and control nausea and diarrhea.    Cyclophosphamide   About This Drug   Cyclophosphamide is used to treat cancer. It is given orally (by mouth) and in the vein (IV).   Possible Side Effects   Decrease in the number of white blood cells. This may raise your risk of infection.    Fever and neutropenic fever. A type of fever that can develop when you have a very low number of white blood cells which can be life-threatening.    Nausea and vomiting (throwing up)    Diarrhea (loose bowel movements)    Hair loss. Hair loss is often temporary, although with certain medicine, hair loss can sometimes be permanent. Hair loss may happen suddenly or gradually. If you lose hair, you may lose it from your head, face,  armpits, pubic area, chest, and/or legs. You may also notice your hair getting thin.   Note: Not all possible side effects are included above.   Warnings and Precautions    Severe bone marrow suppression, which can be life-threatening. This is a decrease in the number of white blood cells, red blood cells, and platelets. This may raise your risk of infection, make you tired and weak, and raise your risk of bleeding.    Abnormal heartbeat and/or heart changes such as inflammation (swelling) in the tissue of the heart and/or congestive heart failure - your heart is not pumping blood as well as it should be, and fluid can build up in your body.    Effects on the bladder and kidneys that may be life-threatening. This drug may cause inflammation (swelling), irritation and bleeding in the bladder and/or kidneys. You may have blood in your urine.  Changes in your liver function and blockage of small veins in the liver, which can cause liver failure and be life-threatening.    Inflammation (swelling) and/or thickening of the lungs, and changes to the small vessels of your lungs. You may have a dry cough or trouble breathing.    This drug may cause slow wound healing.    A severe decreased level of sodium in your blood, which can be life-threatening.    This drug may raise your risk of getting a  second cancer.     These side effects may be more severe if you are receiving high doses of this medication included in pre-transplant chemotherapy.   Note: Some of the side effects above are very rare. If you have concerns and/or questions, please discuss them with your medical team.   Important Information    Your doctor may recommend that you drink extra fluids during or after your treatment to flush your bladder and urinate often to help decrease the risk of the effects on your bladder.    Cyclophosphamide may cause slow wound healing. If you must have emergency surgery or have an accident that results  in a wound, tell the doctor that you are on cyclophosphamide.    This drug may impair your ability to drive or use machinery. Use caution and talk your doctor and/or nurse about any precautions you may need to take.    This drug may be present in the saliva, tears, sweat, urine, stool, vomit, semen, and vaginal secretions. Talk to your doctor and/or your nurse about the necessary precautions to take during this time.   Treating Side Effects    Manage tiredness by pacing your activities for the day.    Be sure to include periods of rest between energy-draining activities.    To decrease the risk of infection, wash your hands regularly.    Avoid close contact with people who have a cold, the flu, or other infections.    Take your temperature as your doctor or nurse tells you, and whenever you feel like you may have a fever.    To help decrease the risk of bleeding, use a soft toothbrush. Check with your nurse before using dental floss.  Be very careful when using knives or tools.    Use an electric shaver instead of a razor.    Drink plenty of fluids (a minimum of eight glasses per day is recommended).    If you throw up or have diarrhea, you should drink more fluids so that you do not become dehydrated (lack of water  in the body from losing too much fluid).    If you have diarrhea, eat low-fiber foods that are high in protein and calories and avoid foods that can irritate your digestive tracts or lead to cramping.    Ask your nurse or doctor about medicine that can lessen or stop your diarrhea.    To help with nausea and vomiting, eat small, frequent meals instead of three large meals a day. Choose foods and drinks that are at room temperature. Ask your nurse or doctor about other helpful tips and medicine that is available to help stop or lessen these symptoms.    To help with hair loss, wash with a mild shampoo and avoid washing your hair every day. Avoid coloring your hair.     Avoid rubbing your scalp, pat your hair or scalp dry. Limit your use of hair spray, electric curlers, blow dryers, and curling irons.    If you are interested in getting a wig, talk to your nurse and they can help you get in touch with programs in your local area.   Food and Drug Interactions    There are no known interactions of cyclophosphamide with food.    Check with your doctor or pharmacist about all other prescription medicines and over-the-counter medicines and dietary supplements (vitamins, minerals, herbs, and others) you are taking before starting this medicine as there are known drug interactions with cyclophosphamide. Also, check  with your doctor or pharmacist before starting any new prescription or over-the-counter medicines, or dietary supplement to make sure that there are no interactions.    There are known interactions of cyclophosphamide with blood thinning medicine such as warfarin. Ask your doctor what precautions you should take.    When given IV, this drug may contain alcohol and may affect your central nervous system, which is made up of your brain and spinal cord. You may feel dizzy and very sleepy.   When to Call the Doctor   Call your doctor or nurse if you have any of these symptoms and/or any new or unusual symptoms:    Fever of 100.4 F (38 C) or higher    Chills    Tiredness that interferes with your daily activities    Feeling dizzy or lightheaded    Easy bleeding or bruising    Dry cough    Wheezing and/or trouble breathing   Swelling of the hands, feet, or any other part of the body    Weight gain of 5 pounds in one week (fluid retention)  Feeling that your heart is beating in a fast or not normal way (palpitations)    Pain in your chest    Nausea that stops you from eating or drinking and/or is not relieved by prescribed medicines    Throwing up more than 3 times a day    Diarrhea, 4 times in one day or diarrhea with lack of strength or a  feeling of being dizzy    Decreased urine, very dark urine, or difficulty urinating    Pain when passing urine or blood in urine    Signs of possible liver problems: dark urine, pale bowel movements, pain in your abdomen, feeling very tired and weak, unusual itching, or yellowing of the eyes or skin    Signs of possible severe low sodium levels: confusion, agitation, feeling that your heart is beating fast, passing out, seizure and/or coma    If you think you may be pregnant or may have impregnated your partner   Reproduction Warnings    Pregnancy warning: This drug can have harmful effects on the unborn baby. Women of childbearing potential should use highly effective methods of birth control during your cancer treatment and for up to 1 year after stopping treatment. Men with female partners who are pregnant or may become pregnant should use a condom during your cancer treatment and for 4 months after stopping treatment. Let your doctor know right away if you think you may be pregnant or may have impregnated your partner.    In women, menstrual bleeding may become irregular or stop while you are getting this drug. Do not assume that you cannot become pregnant if you do not have a menstrual period.    Breastfeeding warning: Women should not breastfeed during treatment and for 1 week after stopping treatment because this drug could enter the breast milk and cause harm to a breastfeeding baby.    Fertility warning: In men and women both, this drug may affect your ability to have children in the future. Talk with your doctor or nurse if you plan to have children. Ask for information on sperm or egg banking.     Docetaxel (Taxotere)   About This Drug   Docetaxel is used to treat cancer. It is given in the vein (IV). It will take 1 hour to infuse.  The first infusion will take longer (about 1.5 hours) due to the fact that  we start this drug at a very slow rate and gradually increase the rate  of infusion until the maximum rate is reached.  This is done to monitor for infusion reactions, and to make sure you will tolerated this drug without adverse effects.  If you tolerate the first infusion, all subsequent infusions will be started at the normal rate and given over 1 hour.  Possible Side Effects    Bone marrow suppression. This is a decrease in the number of white blood cells, red blood cells, and platelets. This may raise your risk of infection, make you tired and weak, and raise your risk of bleeding.    Neutropenic fever. A type of fever that can develop when you have a very low number of white blood cells which can be life-threatening.    Soreness of the mouth and throat. You may have red areas, white patches, or sores that hurt.    Nausea and vomiting (throwing up)    Constipation (not able to move bowels)    Diarrhea (loose bowel movements)    Infections    Fluid retention - swelling of the hands, feet, or any other part of the body. Fluid may build-up around your lungs and/or heart.    Changes in the way food and drinks taste    Effects on the nerves are called peripheral neuropathy. You may feel numbness, tingling, or pain in your hands and feet. It may be hard for you to button your clothes, open jars, or walk as usual. The effect on the nerves may get worse with more doses of the drug. These effects get better in some people after the drug is stopped but it does not get better in all people.    Decreased appetite (decreased hunger)    Weakness    Pain    Muscle pain/aching    Trouble breathing    Changes in your nail color, you may have nail loss and/or brittle nail    Hair loss. Hair loss is often temporary, although there have been cases of permanent hair loss reported. Hair loss may happen suddenly or gradually. If you lose hair, you may lose it from your head, face, armpits, pubic area, chest, and/or legs. You may also notice your hair getting thin.     Allergic skin reaction. You may develop blisters on your skin that are filled with fluid or a severe red rash all over your body that may be painful.    Allergic reactions, including anaphylaxis are rare but may happen in some patients. Signs of allergic reaction to this drug may be swelling of the face, feeling like your tongue or throat are swelling, trouble breathing, rash, itching, fever, chills, feeling dizzy, and/or feeling that your heart is beating in a fast or not normal way. If this happens, do not take another dose of this drug. You should get urgent medical treatment.   Note: Not all possible side effects are included above.   Warnings and Precautions    Severe bone marrow suppression, including febrile neutropenia which can be life-threatening    Severe allergic reactions, including anaphylaxis which can be life-threatening  Colitis, which is swelling (inflammation) in the colon - symptoms are diarrhea (loose bowel movements), stomach cramping, and sometimes blood in the bowel movements  Severe skin reactions, including redness, swelling, or peeling of skin  Severe swelling in the eye or other changes in eyesight    Severe fluid retention    If you have a history  of abnormal liver function, receive high doses of docetaxel, or have a history of lung cancer and have received treatment with a platinum (type of chemotherapy medication), you have an increased risk of death.    Severe weakness    This drug may raise your risk of getting a second cancer such as leukemia, lymphoma, myelodysplastic syndrome, and kidney cancer.    Severe peripheral neuropathy    This drug contains alcohol and may affect your central nervous system. The central nervous system is made up of your brain and spinal cord. You may feel dizzy and very sleepy.    Tumor lysis syndrome: This drug may act on the cancer cells very quickly. This may affect how your kidneys work. Note: Some of the side effects above are  very rare. If you have concerns and/or questions, please discuss them with your medical team. Important Information    This drug may be present in the saliva, tears, sweat, urine, stool, vomit, semen, and vaginal secretions. Talk to your doctor and/or your nurse about the necessary precautions to take during this time.    This drug may impair your ability to drive or use machinery. Use caution and tell your nurse or doctor if you feel dizzy, very sleepy, and/or experience low blood pressure. Treating Side Effects    Manage tiredness by pacing your activities for the day.    Be sure to include periods of rest between energy-draining activities.    Get regular exercise. If you feel too tired to exercise vigorously, try taking a short walk.     To decrease the risk of infection, wash your hands regularly.    Avoid close contact with people who have a cold, the flu, or other infections.    Take your temperature as your doctor or nurse tells you, and whenever you feel like you may have a fever.    To help decrease the risk of bleeding, use a soft toothbrush. Check with your nurse before using dental floss.    Be very careful when using knives or tools.    Use an electric shaver instead of a razor.    Mouth care is very important and will help food taste better and improve your appetite. Your mouth care should consist of routine, gentle cleaning of your teeth or dentures and rinsing your mouth with a mixture of 1/2 teaspoon of salt in 8 ounces of water  or 1/2 teaspoon of baking soda in 8 ounces of water . This should be done at least after each meal and at bedtime.    If you have mouth sores, avoid mouthwash that has alcohol. Also avoid alcohol and smoking because they can bother your mouth and throat.    Taking good care of your mouth may help food taste better and improve your appetite    Drink plenty of fluids (a minimum of eight glasses per day is recommended).    If you throw up or have  diarrhea, you should drink more fluids so that you do not become dehydrated (lack of water  in the body from losing too much fluid).    If you have diarrhea, eat low-fiber foods that are high in protein and calories and avoid foods that can irritate your digestive tracts or lead to cramping.    If you are not able to move your bowels, check with your doctor or nurse before you use enemas, laxatives, or suppositories.    Ask your doctor or nurse about medicines that are available to  help stop or lessen constipation and/ or diarrhea.    To help with nausea and vomiting, eat small, frequent meals instead of three large meals a day. Choose foods and drinks that are at room temperature. Ask your nurse or doctor about other helpful tips and medicine that is available to help stop or lessen these symptoms.    To help with decreased appetite, eat foods high in calories and protein, such as meat, poultry, fish, dry beans, tofu, eggs, nuts, milk, yogurt, cheese, ice cream, pudding, and nutritional supplements.    Consider using sauces and spices to increase taste. Daily exercise, with your doctor's approval, may increase your appetite.    Keeping your pain under control is important to your well-being. Please tell your doctor or nurse if you are experiencing pain.    If you get a rash do not put anything on it unless your doctor or nurse says you may. Keep the area around the rash clean and dry. Ask your doctor for medicine if your rash bothers you.    Keeping your nails moisturized may help with brittleness.    To help with hair loss, wash with a mild shampoo and avoid washing your hair every day. Avoid coloring your hair.    Avoid rubbing your scalp, pat your hair or scalp dry.    Limit your use of hair spray, electric curlers, blow dryers, and curling irons.    If you are interested in getting a wig, talk to your nurse and they can help you get in touch with programs in your local area.    If you  have numbness and tingling in your hands and feet, be careful when cooking, walking, and handling sharp objects and hot liquids.   Food and Drug Interactions    This drug may interact with grapefruit and grapefruit juice. Talk to your doctor as this could make side effects worse.    This drug may interact with other medicines. Tell your doctor and pharmacist about all the prescription and over-the-counter medicines and dietary supplements (vitamins, minerals, herbs, and others) that you are taking at this time. Also, check with your doctor or pharmacist before starting any new prescription or over-the-counter medicines, or dietary supplements to make sure that there are no interactions.    This drug may interact with St. John's Wort and may lower the levels of the drug in your body, which can make it less effective. When to Call the Doctor Call your doctor or nurse if you have any of these symptoms and/or any new or unusual symptoms:    Fever of 100.4 F (38 C) or higher    Chills    Blurred vision or other changes in eyesight, excessive tearing    Symptoms of being drunk, confusion, or being very sleepy    Feeling dizzy or lightheaded  Tiredness and/or weakness that interferes with your daily activities  Easy bruising or bleeding    Wheezing and/or trouble breathing    Chest pain    Pain in your mouth or throat that makes it hard to eat or drink    Nausea that stops you from eating or drinking and/or is not relieved by prescribed medicines    Throwing up more than 3 times a day    Lasting loss of appetite or rapid weight loss of five pounds in a week    Diarrhea, 4 times in one day or diarrhea with lack of strength or a feeling of being dizzy  No bowel movement in 3 days or when you feel uncomfortable    Pain in your abdomen that does not go away    Blood in your stool    Swelling of the hands, feet, or any other part of the body    Weight gain of 5 pounds in one week  (fluid retention)    New rash and/or itching  Rash that is not relieved by prescribed medicines    Signs of inflammation/infection (redness, swelling, pain) of the tissue around your nails.    Signs of allergic reaction: swelling of the face, feeling like your tongue or throat are swelling, trouble breathing, rash, itching, fever, chills, feeling dizzy, and/or feeling that your heart is beating in a fast or not normal way. If this happens, call 911 for emergency care.    Flu-like symptoms: fever, headache, muscle and joint aches, and fatigue (low energy, feeling weak)    Signs of possible liver problems: dark urine, pale bowel movements, pain in your abdomen, feeling very tired and weak, unusual itching, or yellowing of the eyes or skin    Numbness, tingling, or pain in your hands and feet    Signs of tumor lysis: confusion or agitation, decreased urine, nausea/vomiting, diarrhea, muscle cramping, numbness and/or tingling, seizures.    General pain that does not go away or is not relieved by prescribed medicine    If you think you may be pregnant or have impregnated your partner   Reproduction Warnings    Pregnancy warning: This drug can have harmful effects on the unborn baby. Women of childbearing potential should use effective methods of birth control during your cancer treatment and for 6 months after stopping treatment. Men with female partners of childbearing potential should use effective methods of birth control during your cancer treatment and for 3 months after stopping treatment. Let your doctor know right away if you think you may be pregnant or may have impregnated your partner.    Breastfeeding warning: Women should not breastfeed during treatment and for 1 week after stopping treatment because this drug could enter the breast milk and cause harm to a breastfeeding baby.    Fertility warning: In men, this drug may affect your ability to have children in the future. Talk with  your doctor or nurse if you plan to have children. Ask for information on sperm banking.   SELF CARE ACTIVITIES WHILE ON CHEMOTHERAPY/IMMUNOTHERAPY:  Hydration Increase your fluid intake 48 hours prior to treatment and drink at least 8 to 12 cups (64 ounces) of water /decaffeinated beverages per day after treatment. You can still have your cup of coffee or soda but these beverages do not count as part of your 8 to 12 cups that you need to drink daily. No alcohol intake.  Medications Continue taking your normal prescription medication as prescribed.  If you start any new herbal or new supplements please let us  know first to make sure it is safe.  Mouth Care Have teeth cleaned professionally before starting treatment. Keep dentures and partial plates clean. Use soft toothbrush and do not use mouthwashes that contain alcohol. Biotene is a good mouthwash that is available at most pharmacies or may be ordered by calling [phone]. Use warm salt water  gargles (1 teaspoon salt per 1 quart warm water ) before and after meals and at bedtime. Or you may rinse with 2 tablespoons of three-percent hydrogen peroxide mixed in eight ounces of water . If you are still having problems with your mouth or  sores in your mouth please call the clinic. If you need dental work, please let the doctor know before you go for your appointment so that we can coordinate the best possible time for you in regards to your chemo regimen. You need to also let your dentist know that you are actively taking chemo. We may need to do labs prior to your dental appointment.  Skin Care Always use sunscreen that has not expired and with SPF (Sun Protection Factor) of 50 or higher. Wear hats to protect your head from the sun. Remember to use sunscreen on your hands, ears, face, & feet.  Use good moisturizing lotions such as udder cream, eucerin, or even Vaseline. Some chemotherapies can cause dry skin, color changes in your skin and nails.     Avoid long, hot showers or baths. Use gentle, fragrance-free soaps and laundry detergent. Use moisturizers, preferably creams or ointments rather than lotions because the thicker consistency is better at preventing skin dehydration. Apply the cream or ointment within 15 minutes of showering. Reapply moisturizer at night, and moisturize your hands every time after you wash them.   Infection Prevention Please wash your hands for at least 30 seconds using warm soapy water . Handwashing is the #1 way to prevent the spread of germs. Stay away from sick people or people who are getting over a cold. If you develop respiratory systems such as green/yellow mucus production or productive cough or persistent cough let us  know and we will see if you need an antibiotic. It is a good idea to keep a pair of gloves on when going into grocery stores/[employer] to decrease your risk of coming into contact with germs on the carts, etc. Carry alcohol hand gel with you at all times and use it frequently if out in public. If your temperature reaches 100.5 or higher please call the clinic and let us  know.  If it is after hours or on the weekend please go to the ER if your temperature is over 100.4.  Please have your own personal thermometer at home to use.    Sex and bodily fluids If you are going to have sex, a condom must be used to protect the person that isn't taking immunotherapy. For a few days after treatment, immunotherapy can be excreted through your bodily fluids.  When using the toilet please close the lid and flush the toilet twice.  Do this for a few day after you have had immunotherapy.   Contraception It is not known for sure whether or not immunotherapy drugs can be passed on through semen or secretions from the vagina. Because of this some doctors advise people to use a barrier method if you have sex during treatment. This applies to vaginal, anal or oral sex.  Generally, doctors advise a barrier method only  for the time you are actually having the treatment and for about a week after your treatment.  Advice like this can be worrying, but this does not mean that you have to avoid being intimate with your partner. You can still have close contact with your partner and continue to enjoy sex.  Animals If you have cats or birds we just ask that you not change the litter or change the cage.  Please have someone else do this for you while you are on immunotherapy.   Food Safety During and After Cancer Treatment Food safety is important for people both during and after cancer treatment. Cancer and cancer treatments, such as chemotherapy, radiation  therapy, and stem cell/bone marrow transplantation, often weaken the immune system. This makes it harder for your body to protect itself from foodborne illness, also called food poisoning. Foodborne illness is caused by eating food that contains harmful bacteria, parasites, or viruses.  Foods to avoid Some foods have a higher risk of becoming tainted with bacteria. These include: Unwashed fresh fruit and vegetables, especially leafy vegetables that can hide dirt and other contaminants Raw sprouts, such as alfalfa sprouts Raw or undercooked beef, especially ground beef, or other raw or undercooked meat and poultry Fatty, fried, or spicy foods immediately before or after treatment.  These can sit heavy on your stomach and make you feel nauseous. Raw or undercooked shellfish, such as oysters. Sushi and sashimi, which often contain raw fish.  Unpasteurized beverages, such as unpasteurized fruit juices, raw milk, raw yogurt, or cider Undercooked eggs, such as soft boiled, over easy, and poached; raw, unpasteurized eggs; or foods made with raw egg, such as homemade raw cookie dough and homemade mayonnaise  Simple steps for food safety  Shop smart. Do not buy food stored or displayed in an unclean area. Do not buy bruised or damaged fruits or vegetables. Do not buy  cans that have cracks, dents, or bulges. Pick up foods that can spoil at the end of your shopping trip and store them in a cooler on the way home.  Prepare and clean up foods carefully. Rinse all fresh fruits and vegetables under running water , and dry them with a clean towel or paper towel. Clean the top of cans before opening them. After preparing food, wash your hands for 20 seconds with hot water  and soap. Pay special attention to areas between fingers and under nails. Clean your utensils and dishes with hot water  and soap. Disinfect your kitchen and cutting boards using 1 teaspoon of liquid, unscented bleach mixed into 1 quart of water .    Dispose of old food. Eat canned and packaged food before its expiration date (the "use by" or "best before" date). Consume refrigerated leftovers within 3 to 4 days. After that time, throw out the food. Even if the food does not smell or look spoiled, it still may be unsafe. Some bacteria, such as Listeria, can grow even on foods stored in the refrigerator if they are kept for too long.  Take precautions when eating out. At restaurants, avoid buffets and salad bars where food sits out for a long time and comes in contact with many people. Food can become contaminated when someone with a virus, often a norovirus, or another "bug" handles it. Put any leftover food in a "to-go" container yourself, rather than having the server do it. And, refrigerate leftovers as soon as you get home. Choose restaurants that are clean and that are willing to prepare your food as you order it cooked.    SYMPTOMS TO REPORT AS SOON AS POSSIBLE AFTER TREATMENT:  FEVER GREATER THAN 100.4 F CHILLS WITH OR WITHOUT FEVER NAUSEA AND VOMITING THAT IS NOT CONTROLLED WITH YOUR NAUSEA MEDICATION UNUSUAL SHORTNESS OF BREATH UNUSUAL BRUISING OR BLEEDING TENDERNESS IN MOUTH AND THROAT WITH OR WITHOUT PRESENCE OF ULCERS URINARY PROBLEMS BOWEL PROBLEMS UNUSUAL RASH     Wear  comfortable clothing and clothing appropriate for easy access to any Portacath or PICC line. Let us  know if there is anything that we can do to make your therapy better!   What to do if you need assistance after hours or on the weekends: CALL [phone].  HOLD on the line, do not hang up.  You will hear multiple messages but at the end you will be connected with a nurse triage line.  They will contact the doctor if necessary.  Most of the time they will be able to assist you.  Do not call the hospital operator.    I have been informed and understand all of the instructions given to me and have received a copy. I have been instructed to call the clinic [phone] or my family physician as soon as possible for continued medical care, if indicated. I do not have any more questions at this time but understand that I may call the Cancer Center or the Patient Navigator at [phone] during office hours should I have questions or need assistance in obtaining follow-up care.

## 2023-11-25 ENCOUNTER — Inpatient Hospital Stay: Attending: Hematology

## 2023-11-25 ENCOUNTER — Telehealth: Payer: Self-pay | Admitting: *Deleted

## 2023-11-25 DIAGNOSIS — Z79633 Long term (current) use of mitotic inhibitor: Secondary | ICD-10-CM | POA: Insufficient documentation

## 2023-11-25 DIAGNOSIS — Z79630 Long term (current) use of alkylating agent: Secondary | ICD-10-CM | POA: Insufficient documentation

## 2023-11-25 DIAGNOSIS — C50311 Malignant neoplasm of lower-inner quadrant of right female breast: Secondary | ICD-10-CM | POA: Insufficient documentation

## 2023-11-25 DIAGNOSIS — Z5111 Encounter for antineoplastic chemotherapy: Secondary | ICD-10-CM | POA: Insufficient documentation

## 2023-11-25 DIAGNOSIS — Z171 Estrogen receptor negative status [ER-]: Secondary | ICD-10-CM | POA: Insufficient documentation

## 2023-11-25 DIAGNOSIS — Z5189 Encounter for other specified aftercare: Secondary | ICD-10-CM | POA: Insufficient documentation

## 2023-11-25 NOTE — Telephone Encounter (Signed)
 Surgical Date: 10/22/2023 Procedure: Partial Mastectomy (Lumpectomy) W/ Radiofrequency Localizer, Right/ Axillary Sentinel Lymph Node Biopsy, Right  Received call from patient son, Jayson Michael [phone] telephone.  Patient reports hard knot like area in right breast above excision site. Denies pain, warmth to touch, drainage, etc.   Advised that hard knot is most likely scar tissue formation, but she may have RN's at oncology to assess area for concern.   Verbalized understanding.

## 2023-11-25 NOTE — Progress Notes (Signed)

## 2023-11-25 NOTE — Telephone Encounter (Signed)
 RN with oncology assessed area and reports the following: I rubbed/checked it out. I told her with the piece of breast tissue removed, scar tissue, and lymph nodes removed that things don't drain as well for fluid BUT if she was really concerned to call and have it checked out for her peace of mind. no pain where the knot is but the area is on top of her breast that concerns her- Mallory Fearing, RN  Call placed to patient son Jayson Michael. Advised that patient can be worked in to see Dr. Cherilyn Corn today (11/25/2023) or Thursday (11/27/2023).   Patient declined. States that if area remains, she will schedule appointment in June 2025.

## 2023-12-03 ENCOUNTER — Inpatient Hospital Stay: Admitting: Licensed Clinical Social Worker

## 2023-12-03 ENCOUNTER — Encounter: Payer: Self-pay | Admitting: Hematology

## 2023-12-03 DIAGNOSIS — C50311 Malignant neoplasm of lower-inner quadrant of right female breast: Secondary | ICD-10-CM

## 2023-12-04 ENCOUNTER — Telehealth: Payer: Self-pay | Admitting: Dietician

## 2023-12-04 ENCOUNTER — Inpatient Hospital Stay

## 2023-12-04 ENCOUNTER — Other Ambulatory Visit: Payer: Self-pay | Admitting: *Deleted

## 2023-12-04 ENCOUNTER — Inpatient Hospital Stay: Admitting: Dietician

## 2023-12-04 ENCOUNTER — Encounter: Payer: Self-pay | Admitting: *Deleted

## 2023-12-04 VITALS — BP 183/71 | HR 62 | Temp 96.2°F | Resp 19 | Wt 187.1 lb

## 2023-12-04 DIAGNOSIS — C50311 Malignant neoplasm of lower-inner quadrant of right female breast: Secondary | ICD-10-CM

## 2023-12-04 DIAGNOSIS — Z5189 Encounter for other specified aftercare: Secondary | ICD-10-CM | POA: Diagnosis not present

## 2023-12-04 DIAGNOSIS — Z79633 Long term (current) use of mitotic inhibitor: Secondary | ICD-10-CM | POA: Diagnosis not present

## 2023-12-04 DIAGNOSIS — Z5111 Encounter for antineoplastic chemotherapy: Secondary | ICD-10-CM | POA: Diagnosis not present

## 2023-12-04 DIAGNOSIS — Z171 Estrogen receptor negative status [ER-]: Secondary | ICD-10-CM | POA: Diagnosis not present

## 2023-12-04 DIAGNOSIS — Z79630 Long term (current) use of alkylating agent: Secondary | ICD-10-CM | POA: Diagnosis not present

## 2023-12-04 LAB — CBC WITH DIFFERENTIAL/PLATELET
Abs Immature Granulocytes: 0.03 10*3/uL (ref 0.00–0.07)
Basophils Absolute: 0.1 10*3/uL (ref 0.0–0.1)
Basophils Relative: 1 %
Eosinophils Absolute: 0.3 10*3/uL (ref 0.0–0.5)
Eosinophils Relative: 4 %
HCT: 37 % (ref 36.0–46.0)
Hemoglobin: 12 g/dL (ref 12.0–15.0)
Immature Granulocytes: 0 %
Lymphocytes Relative: 40 %
Lymphs Abs: 3.1 10*3/uL (ref 0.7–4.0)
MCH: 29.1 pg (ref 26.0–34.0)
MCHC: 32.4 g/dL (ref 30.0–36.0)
MCV: 89.8 fL (ref 80.0–100.0)
Monocytes Absolute: 0.6 10*3/uL (ref 0.1–1.0)
Monocytes Relative: 7 %
Neutro Abs: 3.6 10*3/uL (ref 1.7–7.7)
Neutrophils Relative %: 48 %
Platelets: 196 10*3/uL (ref 150–400)
RBC: 4.12 MIL/uL (ref 3.87–5.11)
RDW: 14.1 % (ref 11.5–15.5)
WBC: 7.6 10*3/uL (ref 4.0–10.5)
nRBC: 0 % (ref 0.0–0.2)

## 2023-12-04 LAB — COMPREHENSIVE METABOLIC PANEL WITH GFR
ALT: 12 U/L (ref 0–44)
AST: 21 U/L (ref 15–41)
Albumin: 3.3 g/dL — ABNORMAL LOW (ref 3.5–5.0)
Alkaline Phosphatase: 69 U/L (ref 38–126)
Anion gap: 10 (ref 5–15)
BUN: 12 mg/dL (ref 8–23)
CO2: 24 mmol/L (ref 22–32)
Calcium: 9.1 mg/dL (ref 8.9–10.3)
Chloride: 105 mmol/L (ref 98–111)
Creatinine, Ser: 0.82 mg/dL (ref 0.44–1.00)
GFR, Estimated: >60 mL/min (ref >60)
Glucose, Bld: 133 mg/dL — ABNORMAL HIGH (ref 70–99)
Potassium: 3.8 mmol/L (ref 3.5–5.1)
Sodium: 139 mmol/L (ref 135–145)
Total Bilirubin: 0.6 mg/dL (ref 0.0–1.2)
Total Protein: 7.2 g/dL (ref 6.5–8.1)

## 2023-12-04 LAB — MAGNESIUM: Magnesium: 1.8 mg/dL (ref 1.7–2.4)

## 2023-12-04 MED ORDER — DEXAMETHASONE SODIUM PHOSPHATE 10 MG/ML IJ SOLN
10.0000 mg | Freq: Once | INTRAMUSCULAR | Status: AC
  Administered 2023-12-04: 10 mg via INTRAVENOUS
  Filled 2023-12-04: qty 1

## 2023-12-04 MED ORDER — SODIUM CHLORIDE 0.9% FLUSH
10.0000 mL | INTRAVENOUS | Status: DC | PRN
  Administered 2023-12-04: 10 mL via INTRAVENOUS

## 2023-12-04 MED ORDER — SODIUM CHLORIDE 0.9 % IV SOLN
480.0000 mg/m2 | Freq: Once | INTRAVENOUS | Status: AC
  Administered 2023-12-04: 1000 mg via INTRAVENOUS
  Filled 2023-12-04: qty 50

## 2023-12-04 MED ORDER — SODIUM CHLORIDE 0.9% FLUSH
10.0000 mL | INTRAVENOUS | Status: DC | PRN
  Administered 2023-12-04: 10 mL

## 2023-12-04 MED ORDER — PEGFILGRASTIM-CBQV (INF DEV) 6 MG/0.6ML ~~LOC~~ SOSY
6.0000 mg | PREFILLED_SYRINGE | Freq: Once | SUBCUTANEOUS | Status: AC
Start: 2023-12-04 — End: 2023-12-04
  Administered 2023-12-04: 6 mg via SUBCUTANEOUS
  Filled 2023-12-04: qty 0.6

## 2023-12-04 MED ORDER — SODIUM CHLORIDE 0.9 % IV SOLN
60.0000 mg/m2 | Freq: Once | INTRAVENOUS | Status: AC
  Administered 2023-12-04: 115 mg via INTRAVENOUS
  Filled 2023-12-04: qty 11.5

## 2023-12-04 MED ORDER — SODIUM CHLORIDE 0.9 % IV SOLN: INTRAVENOUS | Status: DC

## 2023-12-04 MED ORDER — PALONOSETRON HCL INJECTION 0.25 MG/5ML
0.2500 mg | Freq: Once | INTRAVENOUS | Status: AC
  Administered 2023-12-04: 0.25 mg via INTRAVENOUS
  Filled 2023-12-04: qty 5

## 2023-12-04 MED ORDER — HEPARIN SOD (PORK) LOCK FLUSH 100 UNIT/ML IV SOLN
500.0000 [IU] | Freq: Once | INTRAVENOUS | Status: AC | PRN
  Administered 2023-12-04: 500 [IU]

## 2023-12-04 NOTE — Progress Notes (Signed)
 Patient presents today for chemotherapy infusion.  Patient is in satisfactory condition with no new complaints voiced.  Vital signs are stable.  Labs reviewed and all labs are within treatment parameters.  We will proceed with treatment per MD orders.    Patient tolerated treatment well with no complaints voiced.  BP elevated at completion of treatment.  MD made aware.  Patient is asymptomatic.  Ok to discharge per Dr. Cheree Cords.  Udenyca Onbody placed on L arm.   Patient left via wheelchair with son and daughter in stable condition.  Vital signs stable at discharge.  Follow up as scheduled.

## 2023-12-04 NOTE — Progress Notes (Signed)
 Per patient request, referral made to [facility] for nursing and home health aide services.

## 2023-12-04 NOTE — Patient Instructions (Addendum)
 [facility] [place] - A DEPT OF [place]  Discharge Instructions: Thank you for choosing [facility] to provide your oncology and hematology care.  If you have a lab appointment with the Cancer Center - please note that after April 8th, 2024, all labs will be drawn in the cancer center.  You do not have to check in or register with the main entrance as you have in the past but will complete your check-in in the cancer center.  Wear comfortable clothing and clothing appropriate for easy access to any Portacath or PICC line.   We strive to give you quality time with your provider. You may need to reschedule your appointment if you arrive late (15 or more minutes).  Arriving late affects you and other patients whose appointments are after yours.  Also, if you miss three or more appointments without notifying the office, you may be dismissed from the clinic at the provider's discretion.      For prescription refill requests, have your pharmacy contact our office and allow 72 hours for refills to be completed.    Today you received the following chemotherapy and/or immunotherapy agents Docetaxol/Cytoxan.  Docetaxel Injection What is this medication? DOCETAXEL (doe se TAX el) treats some types of cancer. It works by slowing down the growth of cancer cells. This medicine may be used for other purposes; ask your health care provider or pharmacist if you have questions. COMMON BRAND NAME(S): BEIZRAY, Docefrez, Docivyx, Taxotere What should I tell my care team before I take this medication? They need to know if you have any of these conditions: Kidney disease Liver disease Low white blood cell levels Tingling of the fingers or toes or other nerve disorder An unusual or allergic reaction to docetaxel, polysorbate 80, other medications, foods, dyes, or preservatives Pregnant or trying to get pregnant Breast-feeding How should I use this medication? This  medication is injected into a vein. It is given by your care team in a hospital or clinic setting. Talk to your care team about the use of this medication in children. Special care may be needed. Overdosage: If you think you have taken too much of this medicine contact a poison control center or emergency room at once. NOTE: This medicine is only for you. Do not share this medicine with others. What if I miss a dose? Keep appointments for follow-up doses. It is important not to miss your dose. Call your care team if you are unable to keep an appointment. What may interact with this medication? Do not take this medication with any of the following: Live virus vaccines This medication may also interact with the following: Certain antibiotics, such as clarithromycin, telithromycin Certain antivirals for HIV or hepatitis Certain medications for fungal infections, such as itraconazole, ketoconazole, voriconazole Grapefruit juice Nefazodone Supplements, such as St. John's wort This list may not describe all possible interactions. Give your health care provider a list of all the medicines, herbs, non-prescription drugs, or dietary supplements you use. Also tell them if you smoke, drink alcohol, or use illegal drugs. Some items may interact with your medicine. What should I watch for while using this medication? This medication may make you feel generally unwell. This is not uncommon as chemotherapy can affect healthy cells as well as cancer cells. Report any side effects. Continue your course of treatment even though you feel ill unless your care team tells you to stop. You may need blood work done while  you are taking this medication. This medication can cause serious side effects and infusion reactions. To reduce the risk, your care team may give you other medications to take before receiving this one. Be sure to follow the directions from your care team. This medication may increase your risk of  getting an infection. Call your care team for advice if you get a fever, chills, sore throat, or other symptoms of a cold or flu. Do not treat yourself. Try to avoid being around people who are sick. Avoid taking medications that contain aspirin, acetaminophen , ibuprofen , naproxen, or ketoprofen unless instructed by your care team. These medications may hide a fever. Be careful brushing or flossing your teeth or using a toothpick because you may get an infection or bleed more easily. If you have any dental work done, tell your dentist you are receiving this medication. Some products may contain alcohol. Ask your care team if this medication contains alcohol. Be sure to tell all care teams you are taking this medicine. Certain medications, like metronidazole and disulfiram, can cause an unpleasant reaction when taken with alcohol. The reaction includes flushing, headache, nausea, vomiting, sweating, and increased thirst. The reaction can last from 30 minutes to several hours. This medication may affect your coordination, reaction time, or judgement. Do not drive or operate machinery until you know how this medication affects you. Sit up or stand slowly to reduce the risk of dizzy or fainting spells. Drinking alcohol with this medication can increase the risk of these side effects. Talk to your care team about your risk of cancer. You may be more at risk for certain types of cancer if you take this medication. Talk to your care team if you wish to become pregnant or think you might be pregnant. This medication can cause serious birth defects if taken during pregnancy or if you get pregnant within 2 months after stopping therapy. A negative pregnancy test is required before starting this medication. A reliable form of contraception is recommended while taking this medication and for 2 months after stopping it. Talk to your care team about reliable forms of contraception. Do not breast-feed while taking this  medication and for 1 week after stopping therapy. Use a condom during sex and for 4 months after stopping therapy. Tell your care team right away if you think your partner might be pregnant. This medication can cause serious birth defects. This medication may cause infertility. Talk to your care team if you are concerned about your fertility. What side effects may I notice from receiving this medication? Side effects that you should report to your care team as soon as possible: Allergic reactions--skin rash, itching, hives, swelling of the face, lips, tongue, or throat Change in vision such as blurry vision, seeing halos around lights, vision loss Infection--fever, chills, cough, or sore throat Infusion reactions--chest pain, shortness of breath or trouble breathing, feeling faint or lightheaded Low red blood cell level--unusual weakness or fatigue, dizziness, headache, trouble breathing Pain, tingling, or numbness in the hands or feet Painful swelling, warmth, or redness of the skin, blisters or sores at the infusion site Redness, blistering, peeling, or loosening of the skin, including inside the mouth Sudden or severe stomach pain, bloody diarrhea, fever, nausea, vomiting Swelling of the ankles, hands, or feet Tumor lysis syndrome (TLS)--nausea, vomiting, diarrhea, decrease in the amount of urine, dark urine, unusual weakness or fatigue, confusion, muscle pain or cramps, fast or irregular heartbeat, joint pain Unusual bruising or bleeding Side effects that usually  do not require medical attention (report to your care team if they continue or are bothersome): Change in nail shape, thickness, or color Change in taste Hair loss Increased tears This list may not describe all possible side effects. Call your doctor for medical advice about side effects. You may report side effects to FDA at 1-800-FDA-1088. Where should I keep my medication? This medication is given in a hospital or clinic. It  will not be stored at home. NOTE: This sheet is a summary. It may not cover all possible information. If you have questions about this medicine, talk to your doctor, pharmacist, or health care provider.  2024 Elsevier/Gold Standard (2021-09-13 00:00:00)     Cyclophosphamide Injection What is this medication? CYCLOPHOSPHAMIDE (sye kloe FOSS fa mide) treats some types of cancer. It works by slowing down the growth of cancer cells. This medicine may be used for other purposes; ask your health care provider or pharmacist if you have questions. COMMON BRAND NAME(S): Cyclophosphamide, Cytoxan, Neosar What should I tell my care team before I take this medication? They need to know if you have any of these conditions: Heart disease Irregular heartbeat or rhythm Infection Kidney problems Liver disease Low blood cell levels (white cells, platelets, or red blood cells) Lung disease Previous radiation Trouble passing urine An unusual or allergic reaction to cyclophosphamide, other medications, foods, dyes, or preservatives Pregnant or trying to get pregnant Breast-feeding How should I use this medication? This medication is injected into a vein. It is given by your care team in a hospital or clinic setting. Talk to your care team about the use of this medication in children. Special care may be needed. Overdosage: If you think you have taken too much of this medicine contact a poison control center or emergency room at once. NOTE: This medicine is only for you. Do not share this medicine with others. What if I miss a dose? Keep appointments for follow-up doses. It is important not to miss your dose. Call your care team if you are unable to keep an appointment. What may interact with this medication? Amphotericin B Amiodarone Azathioprine Certain antivirals for HIV or hepatitis Certain medications for blood pressure, such as enalapril, lisinopril,  quinapril Cyclosporine Diuretics Etanercept Indomethacin Medications that relax muscles Metronidazole Natalizumab Tamoxifen Warfarin This list may not describe all possible interactions. Give your health care provider a list of all the medicines, herbs, non-prescription drugs, or dietary supplements you use. Also tell them if you smoke, drink alcohol, or use illegal drugs. Some items may interact with your medicine. What should I watch for while using this medication? This medication may make you feel generally unwell. This is not uncommon as chemotherapy can affect healthy cells as well as cancer cells. Report any side effects. Continue your course of treatment even though you feel ill unless your care team tells you to stop. You may need blood work while you are taking this medication. This medication may increase your risk of getting an infection. Call your care team for advice if you get a fever, chills, sore throat, or other symptoms of a cold or flu. Do not treat yourself. Try to avoid being around people who are sick. Avoid taking medications that contain aspirin, acetaminophen , ibuprofen , naproxen, or ketoprofen unless instructed by your care team. These medications may hide a fever. Be careful brushing or flossing your teeth or using a toothpick because you may get an infection or bleed more easily. If you have any dental work done,  tell your dentist you are receiving this medication. Drink water  or other fluids as directed. Urinate often, even at night. Some products may contain alcohol. Ask your care team if this medication contains alcohol. Be sure to tell all care teams you are taking this medicine. Certain medicines, like metronidazole and disulfiram, can cause an unpleasant reaction when taken with alcohol. The reaction includes flushing, headache, nausea, vomiting, sweating, and increased thirst. The reaction can last from 30 minutes to several hours. Talk to your care team if you  wish to become pregnant or think you might be pregnant. This medication can cause serious birth defects if taken during pregnancy and for 1 year after the last dose. A negative pregnancy test is required before starting this medication. A reliable form of contraception is recommended while taking this medication and for 1 year after the last dose. Talk to your care team about reliable forms of contraception. Do not father a child while taking this medication and for 4 months after the last dose. Use a condom during this time period. Do not breast-feed while taking this medication or for 1 week after the last dose. This medication may cause infertility. Talk to your care team if you are concerned about your fertility. Talk to your care team about your risk of cancer. You may be more at risk for certain types of cancer if you take this medication. What side effects may I notice from receiving this medication? Side effects that you should report to your care team as soon as possible: Allergic reactions--skin rash, itching, hives, swelling of the face, lips, tongue, or throat Dry cough, shortness of breath or trouble breathing Heart failure--shortness of breath, swelling of the ankles, feet, or hands, sudden weight gain, unusual weakness or fatigue Heart muscle inflammation--unusual weakness or fatigue, shortness of breath, chest pain, fast or irregular heartbeat, dizziness, swelling of the ankles, feet, or hands Heart rhythm changes--fast or irregular heartbeat, dizziness, feeling faint or lightheaded, chest pain, trouble breathing Infection--fever, chills, cough, sore throat, wounds that don't heal, pain or trouble when passing urine, general feeling of discomfort or being unwell Kidney injury--decrease in the amount of urine, swelling of the ankles, hands, or feet Liver injury--right upper belly pain, loss of appetite, nausea, light-colored stool, dark yellow or Amijah Timothy urine, yellowing skin or eyes,  unusual weakness or fatigue Low red blood cell level--unusual weakness or fatigue, dizziness, headache, trouble breathing Low sodium level--muscle weakness, fatigue, dizziness, headache, confusion Red or dark Brendaly Townsel urine Unusual bruising or bleeding Side effects that usually do not require medical attention (report to your care team if they continue or are bothersome): Hair loss Irregular menstrual cycles or spotting Loss of appetite Nausea Pain, redness, or swelling with sores inside the mouth or throat Vomiting This list may not describe all possible side effects. Call your doctor for medical advice about side effects. You may report side effects to FDA at 1-800-FDA-1088. Where should I keep my medication? This medication is given in a hospital or clinic. It will not be stored at home. NOTE: This sheet is a summary. It may not cover all possible information. If you have questions about this medicine, talk to your doctor, pharmacist, or health care provider.  2024 Elsevier/Gold Standard (2021-11-23 00:00:00)   Pegfilgrastim Injection What is this medication? PEGFILGRASTIM (PEG fil gra stim) lowers the risk of infection in people who are receiving chemotherapy. It works by Systems analyst make more white blood cells, which protects your body from infection. It  may also be used to help people who have been exposed to high doses of radiation. This medicine may be used for other purposes; ask your health care provider or pharmacist if you have questions. COMMON BRAND NAME(S): Fulphila, Fylnetra, Neulasta, Nyvepria, Stimufend, UDENYCA, UDENYCA ONBODY, Ziextenzo What should I tell my care team before I take this medication? They need to know if you have any of these conditions: Kidney disease Latex allergy Ongoing radiation therapy Sickle cell disease Skin reactions to acrylic adhesives (On-Body Injector only) An unusual or allergic reaction to pegfilgrastim, filgrastim, other  medications, foods, dyes, or preservatives Pregnant or trying to get pregnant Breast-feeding How should I use this medication? This medication is for injection under the skin. If you get this medication at home, you will be taught how to prepare and give the pre-filled syringe or how to use the On-body Injector. Refer to the patient Instructions for Use for detailed instructions. Use exactly as directed. Tell your care team immediately if you suspect that the On-body Injector may not have performed as intended or if you suspect the use of the On-body Injector resulted in a missed or partial dose. It is important that you put your used needles and syringes in a special sharps container. Do not put them in a trash can. If you do not have a sharps container, call your pharmacist or care team to get one. Talk to your care team about the use of this medication in children. While this medication may be prescribed for selected conditions, precautions do apply. Overdosage: If you think you have taken too much of this medicine contact a poison control center or emergency room at once. NOTE: This medicine is only for you. Do not share this medicine with others. What if I miss a dose? It is important not to miss your dose. Call your care team if you miss your dose. If you miss a dose due to an On-body Injector failure or leakage, a new dose should be administered as soon as possible using a single prefilled syringe for manual use. What may interact with this medication? Interactions have not been studied. This list may not describe all possible interactions. Give your health care provider a list of all the medicines, herbs, non-prescription drugs, or dietary supplements you use. Also tell them if you smoke, drink alcohol, or use illegal drugs. Some items may interact with your medicine. What should I watch for while using this medication? Your condition will be monitored carefully while you are receiving this  medication. You may need blood work done while you are taking this medication. Talk to your care team about your risk of cancer. You may be more at risk for certain types of cancer if you take this medication. If you are going to need a MRI, CT scan, or other procedure, tell your care team that you are using this medication (On-Body Injector only). What side effects may I notice from receiving this medication? Side effects that you should report to your care team as soon as possible: Allergic reactions--skin rash, itching, hives, swelling of the face, lips, tongue, or throat Capillary leak syndrome--stomach or muscle pain, unusual weakness or fatigue, feeling faint or lightheaded, decrease in the amount of urine, swelling of the ankles, hands, or feet, trouble breathing High white blood cell level--fever, fatigue, trouble breathing, night sweats, change in vision, weight loss Inflammation of the aorta--fever, fatigue, back, chest, or stomach pain, severe headache Kidney injury (glomerulonephritis)--decrease in the amount of urine, red  or dark Christopherjohn Schiele urine, foamy or bubbly urine, swelling of the ankles, hands, or feet Shortness of breath or trouble breathing Spleen injury--pain in upper left stomach or shoulder Unusual bruising or bleeding Side effects that usually do not require medical attention (report to your care team if they continue or are bothersome): Bone pain Pain in the hands or feet This list may not describe all possible side effects. Call your doctor for medical advice about side effects. You may report side effects to FDA at 1-800-FDA-1088. Where should I keep my medication? Keep out of the reach of children. If you are using this medication at home, you will be instructed on how to store it. Throw away any unused medication after the expiration date on the label. NOTE: This sheet is a summary. It may not cover all possible information. If you have questions about this medicine,  talk to your doctor, pharmacist, or health care provider.  2024 Elsevier/Gold Standard (2021-06-08 00:00:00)       To help prevent nausea and vomiting after your treatment, we encourage you to take your nausea medication as directed.  BELOW ARE SYMPTOMS THAT SHOULD BE REPORTED IMMEDIATELY: *FEVER GREATER THAN 100.4 F (38 C) OR HIGHER *CHILLS OR SWEATING *NAUSEA AND VOMITING THAT IS NOT CONTROLLED WITH YOUR NAUSEA MEDICATION *UNUSUAL SHORTNESS OF BREATH *UNUSUAL BRUISING OR BLEEDING *URINARY PROBLEMS (pain or burning when urinating, or frequent urination) *BOWEL PROBLEMS (unusual diarrhea, constipation, pain near the anus) TENDERNESS IN MOUTH AND THROAT WITH OR WITHOUT PRESENCE OF ULCERS (sore throat, sores in mouth, or a toothache) UNUSUAL RASH, SWELLING OR PAIN  UNUSUAL VAGINAL DISCHARGE OR ITCHING   Items with * indicate a potential emergency and should be followed up as soon as possible or go to the Emergency Department if any problems should occur.  Please show the CHEMOTHERAPY ALERT CARD or IMMUNOTHERAPY ALERT CARD at check-in to the Emergency Department and triage nurse.  Should you have questions after your visit or need to cancel or reschedule your appointment, please contact [facility] [place] - A DEPT OF [facility] [phone]  and follow the prompts.  Office hours are 8:00 a.m. to 4:30 p.m. Monday - Friday. Please note that voicemails left after 4:00 p.m. may not be returned until the following business day.  We are closed weekends and major holidays. You have access to a nurse at all times for urgent questions. Please call the main number to the clinic [phone] and follow the prompts.  For any non-urgent questions, you may also contact your provider using MyChart. We now offer e-Visits for anyone 31 and older to request care online for non-urgent symptoms. For details visit [email or web address].   Also download the MyChart app! Go to the app  store, search "MyChart", open the app, select [place], and log in with your MyChart username and password.

## 2023-12-04 NOTE — Progress Notes (Signed)
 Pharmacist Chemotherapy Monitoring - Initial Assessment    Anticipated start date: 12/04/23   The following has been reviewed per standard work regarding the patient's treatment regimen: The patient's diagnosis, treatment plan and drug doses, and organ/hematologic function Lab orders and baseline tests specific to treatment regimen  The treatment plan start date, drug sequencing, and pre-medications Prior authorization status  Patient's documented medication list, including drug-drug interaction screen and prescriptions for anti-emetics and supportive care specific to the treatment regimen The drug concentrations, fluid compatibility, administration routes, and timing of the medications to be used The patient's access for treatment and lifetime cumulative dose history, if applicable  The patient's medication allergies and previous infusion related reactions, if applicable   Changes made to treatment plan:  N/A  Follow up needed:  N/A  Udenyca OnBody added.   Artie Laster, [facility], 12/04/2023  11:26 AM 12/04/23

## 2023-12-04 NOTE — Progress Notes (Signed)
 Nutrition Assessment   Reason for Assessment: Referral (new pt)   ASSESSMENT: 78 year old female with stage II right breast cancer, estrogen receptor negative. Patient receiving TC q21d (start 5/15). Pt is under the care of Dr. Cheree Cords.   Past medical history includes pulmonary HTN, GERD, HLD, EBV, insomnia, anxiety, depression  Met with patient and daughter in infusion. Patient reports fatigue secondary to chronic insomnia. Her appetite is fairly good. Eats 2 meals most days. Pt endorses occasional poor appetite. These days she will snack on lunch meat and crackers. Yesterday patient had sausage/egg/cheese biscuit for breakfast, salisbury steak, corn, stewed apples for dinner. She is drinking ~80 ounces of water . She denies nausea, vomiting, diarrhea, constipation.   Nutrition Focused Physical Exam: deferred    Medications: amlodipine , D3, clonidine , colace, pepcid, lasix , gabapentin , lasix , gabapentin , losartan , magnesium , toprol , melatonin, prilosec, roxicodone , B6, zoloft , restoril , compazine , zofran    Labs: glucose 133, albumin 3.3   Anthropometrics:   Height: 5'2" Weight: 187 lb 1.6 oz  UBW: 180-185 lb BMI: 34.22   NUTRITION DIAGNOSIS: Food and nutrition related knowledge deficit related to cancer as evidenced by no prior need for associated nutrition information    INTERVENTION:  Educated on importance of adequate calorie/protein energy intake to maintain weights    MONITORING, EVALUATION, GOAL: Pt will tolerate adequate calories and protein to minimize wt loss during treatment    Next Visit: To be scheduled as needed

## 2023-12-05 ENCOUNTER — Other Ambulatory Visit: Payer: Self-pay

## 2023-12-05 ENCOUNTER — Inpatient Hospital Stay

## 2023-12-05 NOTE — Progress Notes (Signed)
 24 HOUR CHEMOTHERAPY CALL BACK:  Patient is feeling well.  No side effects or symptoms.  Patient will call back with any questions or concerns.

## 2023-12-08 ENCOUNTER — Other Ambulatory Visit: Payer: Self-pay

## 2023-12-09 ENCOUNTER — Encounter: Payer: Self-pay | Admitting: Hematology

## 2023-12-09 NOTE — Progress Notes (Signed)
 [facility] Clinical Social Work  Initial Assessment   [name] is a 78 y.o. year old female contacted by phone. Clinical Social Work was referred by medical provider for assessment of psychosocial needs.   SDOH (Social Determinants of Health) assessments performed: Yes SDOH Interventions    Flowsheet Row Care Coordination from 08/12/2023 in [facility] Care Coordination Care Coordination from 05/22/2023 in [facility] Community Care Coordination Care Coordination from 01/07/2023 in [facility] Community Care Coordination Clinical Support from 05/02/2021 in [facility] Video Visit from 05/01/2020 in [facility]  SDOH Interventions       Food Insecurity Interventions Intervention Not Indicated Intervention Not Indicated Intervention Not Indicated Intervention Not Indicated Intervention Not Indicated  Housing Interventions Intervention Not Indicated Intervention Not Indicated Intervention Not Indicated Intervention Not Indicated Intervention Not Indicated  Transportation Interventions Intervention Not Indicated, Patient Resources (Friends/Family) Intervention Not Indicated, Patient Resources (Friends/Family), Payor Benefit --  [Patient recently has been restricted from driving, hardship on son/daughter-in-law to take patient to md apt in the future] Intervention Not Indicated Intervention Not Indicated  Utilities Interventions Intervention Not Indicated Intervention Not Indicated Intervention Not Indicated -- --  Alcohol Usage Interventions Intervention Not Indicated (Score <7) Intervention Not Indicated (Score <7) -- -- --  Depression Interventions/Treatment  Medication, Counseling, Currently on Treatment, Community Resources Provided Referral to Psychiatry, Medication, Counseling -- -- --  Financial Strain Interventions Intervention Not Indicated Intervention Not Indicated -- Intervention Not Indicated  Intervention Not Indicated  Physical Activity Interventions Intervention Not Indicated, Community Resources Provided Intervention Not Indicated -- Intervention Not Indicated Intervention Not Indicated  Stress Interventions Intervention Not Indicated, Offered [employer] Resources, [employer] Provided, Provide Counseling Intervention Not Indicated -- Intervention Not Indicated Intervention Not Indicated  Social Connections Interventions Intervention Not Indicated, Community Resources Provided Intervention Not Indicated -- Intervention Not Indicated Intervention Not Indicated  Health Literacy Interventions Intervention Not Indicated Intervention Not Indicated -- -- --       SDOH Screenings   Food Insecurity: No Food Insecurity (08/12/2023)  Housing: Unknown (08/12/2023)  Transportation Needs: No Transportation Needs (08/12/2023)  Utilities: Not At Risk (08/12/2023)  Alcohol Screen: Low Risk  (08/12/2023)  Depression (PHQ2-9): High Risk (08/20/2023)  Financial Resource Strain: Low Risk  (08/12/2023)  Physical Activity: Sufficiently Active (08/12/2023)  Social Connections: Moderately Integrated (08/12/2023)  Stress: No Stress Concern Present (08/12/2023)  Tobacco Use: Low Risk  (11/18/2023)  Health Literacy: Adequate Health Literacy (08/12/2023)     Distress Screen completed: Yes    11/25/2023   11:35 AM  ONCBCN DISTRESS SCREENING  Screening Type Initial Screening  How much distress have you been experiencing in the past week? (0-10) 2  Emotional concerns type Depression  Information Concerns Type Lack of info about diagnosis  Physical Concerns Type  Sleep/insomnia  Physician notified of physical symptoms No  Referral to clinical psychology Yes  Referral to clinical social work No  Referral to dietition No  Referral to financial advocate No  Referral to support programs No  Referral to palliative care No      Family/Social Information:  Housing Arrangement: patient lives  with her son.  Pt was residing independently, but since surgery has been staying w/ her son and plans to remain there until she completes treatment.  At baseline pt is independent in ADLs, but does use a rollator for ambulation at this time and has a bedside commode.   Family members/support persons in your life?  Pt's son, daughter and brother are all involved in pt's care and provide support.   Transportation concerns: no  Employment: Retired .  Income source: Actor concerns: No Type of concern: None Food access concerns: No Religious or spiritual practice: Yes-[employer] Advanced directives: Not known Services Currently in place:  none  Coping/ Adjustment to diagnosis: Patient understands treatment plan and what happens next? yes Concerns about diagnosis and/or treatment: Overwhelmed by information and Quality of life Patient reported stressors: Adjusting to my illness and Physical issues Hopes and/or priorities: pt's priority is to continue treatment w/ the hope of positive results Patient enjoys time with family/ friends Current coping skills/ strengths: Motivation for treatment/growth  and Supportive family/friends     SUMMARY: Current SDOH Barriers:  No barriers identified at this time.  Clinical Social Work Clinical Goal(s):  No clinical social work goals at this time  Interventions: Discussed common feeling and emotions when being diagnosed with cancer, and the importance of support during treatment Informed patient of the support team roles and support services at [facility] Provided CSW contact information and encouraged patient to call with any questions or concerns    Follow Up Plan: Patient will contact CSW with any support or resource needs Patient verbalizes understanding of plan: Yes    Quenton Bruns, LCSW Clinical Social Worker [facility]

## 2023-12-16 ENCOUNTER — Telehealth: Payer: Self-pay | Admitting: Pulmonary Disease

## 2023-12-16 NOTE — Telephone Encounter (Signed)
 Rc'd fax from [facility] for O2. Will send to Dr. Gaynell Keeler for signature.

## 2023-12-21 ENCOUNTER — Encounter (HOSPITAL_BASED_OUTPATIENT_CLINIC_OR_DEPARTMENT_OTHER): Admitting: Pulmonary Disease

## 2023-12-23 NOTE — Telephone Encounter (Signed)
Duplicate received.

## 2023-12-25 ENCOUNTER — Encounter: Payer: Self-pay | Admitting: Hematology

## 2023-12-25 ENCOUNTER — Inpatient Hospital Stay

## 2023-12-25 ENCOUNTER — Inpatient Hospital Stay: Admitting: Hematology

## 2023-12-25 ENCOUNTER — Other Ambulatory Visit: Payer: Self-pay

## 2023-12-25 ENCOUNTER — Inpatient Hospital Stay: Attending: Hematology

## 2023-12-25 VITALS — BP 126/43 | HR 66 | Temp 97.0°F | Resp 16

## 2023-12-25 DIAGNOSIS — Z1501 Genetic susceptibility to malignant neoplasm of breast: Secondary | ICD-10-CM | POA: Diagnosis not present

## 2023-12-25 DIAGNOSIS — Z171 Estrogen receptor negative status [ER-]: Secondary | ICD-10-CM

## 2023-12-25 DIAGNOSIS — C50311 Malignant neoplasm of lower-inner quadrant of right female breast: Secondary | ICD-10-CM

## 2023-12-25 DIAGNOSIS — Z5189 Encounter for other specified aftercare: Secondary | ICD-10-CM | POA: Insufficient documentation

## 2023-12-25 DIAGNOSIS — Z1509 Genetic susceptibility to other malignant neoplasm: Secondary | ICD-10-CM

## 2023-12-25 DIAGNOSIS — Z5111 Encounter for antineoplastic chemotherapy: Secondary | ICD-10-CM | POA: Insufficient documentation

## 2023-12-25 DIAGNOSIS — Z17421 Hormone receptor negative with human epidermal growth factor receptor 2 negative status: Secondary | ICD-10-CM | POA: Diagnosis not present

## 2023-12-25 LAB — COMPREHENSIVE METABOLIC PANEL WITH GFR
ALT: 14 U/L (ref 0–44)
AST: 21 U/L (ref 15–41)
Albumin: 3 g/dL — ABNORMAL LOW (ref 3.5–5.0)
Alkaline Phosphatase: 70 U/L (ref 38–126)
Anion gap: 9 (ref 5–15)
BUN: 17 mg/dL (ref 8–23)
CO2: 24 mmol/L (ref 22–32)
Calcium: 9 mg/dL (ref 8.9–10.3)
Chloride: 104 mmol/L (ref 98–111)
Creatinine, Ser: 0.75 mg/dL (ref 0.44–1.00)
GFR, Estimated: >60 mL/min (ref >60)
Glucose, Bld: 135 mg/dL — ABNORMAL HIGH (ref 70–99)
Potassium: 3.8 mmol/L (ref 3.5–5.1)
Sodium: 137 mmol/L (ref 135–145)
Total Bilirubin: 0.5 mg/dL (ref 0.0–1.2)
Total Protein: 6.6 g/dL (ref 6.5–8.1)

## 2023-12-25 LAB — CBC WITH DIFFERENTIAL/PLATELET
Abs Immature Granulocytes: 0.06 10*3/uL (ref 0.00–0.07)
Basophils Absolute: 0.2 10*3/uL — ABNORMAL HIGH (ref 0.0–0.1)
Basophils Relative: 2 %
Eosinophils Absolute: 0.1 10*3/uL (ref 0.0–0.5)
Eosinophils Relative: 1 %
HCT: 33.9 % — ABNORMAL LOW (ref 36.0–46.0)
Hemoglobin: 10.5 g/dL — ABNORMAL LOW (ref 12.0–15.0)
Immature Granulocytes: 1 %
Lymphocytes Relative: 23 %
Lymphs Abs: 2.4 10*3/uL (ref 0.7–4.0)
MCH: 28.2 pg (ref 26.0–34.0)
MCHC: 31 g/dL (ref 30.0–36.0)
MCV: 90.9 fL (ref 80.0–100.0)
Monocytes Absolute: 0.8 10*3/uL (ref 0.1–1.0)
Monocytes Relative: 7 %
Neutro Abs: 7.3 10*3/uL (ref 1.7–7.7)
Neutrophils Relative %: 66 %
Platelets: 242 10*3/uL (ref 150–400)
RBC: 3.73 MIL/uL — ABNORMAL LOW (ref 3.87–5.11)
RDW: 14.6 % (ref 11.5–15.5)
WBC: 10.8 10*3/uL — ABNORMAL HIGH (ref 4.0–10.5)
nRBC: 0 % (ref 0.0–0.2)

## 2023-12-25 LAB — IRON AND TIBC
Iron: 77 ug/dL (ref 28–170)
Saturation Ratios: 27 % (ref 10.4–31.8)
TIBC: 287 ug/dL (ref 250–450)
UIBC: 210 ug/dL

## 2023-12-25 LAB — MAGNESIUM: Magnesium: 1.6 mg/dL — ABNORMAL LOW (ref 1.7–2.4)

## 2023-12-25 LAB — FERRITIN: Ferritin: 166 ng/mL (ref 11–307)

## 2023-12-25 MED ORDER — SODIUM CHLORIDE 0.9% FLUSH: 10.0000 mL | INTRAVENOUS | Status: DC | PRN

## 2023-12-25 MED ORDER — SODIUM CHLORIDE 0.9 % IV SOLN
480.0000 mg/m2 | Freq: Once | INTRAVENOUS | Status: AC
  Administered 2023-12-25: 1000 mg via INTRAVENOUS
  Filled 2023-12-25: qty 50

## 2023-12-25 MED ORDER — SODIUM CHLORIDE 0.9 % IV SOLN
60.0000 mg/m2 | Freq: Once | INTRAVENOUS | Status: AC
  Administered 2023-12-25: 115 mg via INTRAVENOUS
  Filled 2023-12-25: qty 11.5

## 2023-12-25 MED ORDER — SODIUM CHLORIDE 0.9% FLUSH
10.0000 mL | Freq: Once | INTRAVENOUS | Status: AC
  Administered 2023-12-25: 10 mL via INTRAVENOUS

## 2023-12-25 MED ORDER — DEXAMETHASONE SODIUM PHOSPHATE 10 MG/ML IJ SOLN
10.0000 mg | Freq: Once | INTRAMUSCULAR | Status: AC
  Administered 2023-12-25: 10 mg via INTRAVENOUS
  Filled 2023-12-25: qty 1

## 2023-12-25 MED ORDER — MAGNESIUM SULFATE 2 GM/50ML IV SOLN
2.0000 g | Freq: Once | INTRAVENOUS | Status: AC
  Administered 2023-12-25: 2 g via INTRAVENOUS

## 2023-12-25 MED ORDER — PALONOSETRON HCL INJECTION 0.25 MG/5ML
0.2500 mg | Freq: Once | INTRAVENOUS | Status: AC
  Administered 2023-12-25: 0.25 mg via INTRAVENOUS
  Filled 2023-12-25: qty 5

## 2023-12-25 MED ORDER — HEPARIN SOD (PORK) LOCK FLUSH 100 UNIT/ML IV SOLN
500.0000 [IU] | Freq: Once | INTRAVENOUS | Status: DC | PRN
Start: 2023-12-25 — End: 2023-12-25

## 2023-12-25 MED ORDER — APREPITANT 130 MG/18ML IV EMUL
130.0000 mg | Freq: Once | INTRAVENOUS | Status: DC
  Filled 2023-12-25: qty 18

## 2023-12-25 MED ORDER — SODIUM CHLORIDE 0.9 % IV SOLN: INTRAVENOUS | Status: DC

## 2023-12-25 MED ORDER — PEGFILGRASTIM-CBQV (INF DEV) 6 MG/0.6ML ~~LOC~~ SOSY
6.0000 mg | PREFILLED_SYRINGE | Freq: Once | SUBCUTANEOUS | Status: AC
  Administered 2023-12-25: 6 mg via SUBCUTANEOUS
  Filled 2023-12-25: qty 0.6

## 2023-12-25 NOTE — Progress Notes (Signed)
 Patient presents today for follow up visit with Dr. Katragadda and treatment. (Docetaxel /Cytoxan /Onpro). Vital signs and labs within parameters for treatment. Magnesium  1.6. Standing orders to be followed. Patient will receive 2 grams Magnesium  Sulfate today.   Verbal message received patient is ready for treatment. 20% dose reduction.Ferritin and iron panel added to labs per Gustav Lehmann LPN.

## 2023-12-25 NOTE — Progress Notes (Signed)
 Patient has been assessed, vital signs and labs have been reviewed by Dr. Cheree Cords. ANC, Creatinine, LFTs, and Platelets are within treatment parameters per Dr. Cheree Cords. The patient is good to proceed with treatment 20% dose reduction at this time.  Primary RN and pharmacy aware.

## 2023-12-25 NOTE — Patient Instructions (Addendum)
 [facility]  Discharge Instructions  You were seen and examined today by Dr. Cheree Cords.  Dr. Cheree Cords discussed your most recent lab work which revealed that your hemoglobin is low. Dr. Katragadda is going to add on labs to check your iron levels.   You will receive your treatment today.  Follow-up as scheduled.    Thank you for choosing [facility] to provide your oncology and hematology care.   To afford each patient quality time with our provider, please arrive at least 15 minutes before your scheduled appointment time. You may need to reschedule your appointment if you arrive late (10 or more minutes). Arriving late affects you and other patients whose appointments are after yours.  Also, if you miss three or more appointments without notifying the office, you may be dismissed from the clinic at the provider's discretion.    Again, thank you for choosing [facility].  Our hope is that these requests will decrease the amount of time that you wait before being seen by our physicians.   If you have a lab appointment with the Cancer Center - please note that after April 8th, all labs will be drawn in the cancer center.  You do not have to check in or register with the main entrance as you have in the past but will complete your check-in at the cancer center.            _____________________________________________________________  Should you have questions after your visit to [facility], please contact our office at [phone] and follow the prompts.  Our office hours are 8:00 a.m. to 4:30 p.m. Monday - Thursday and 8:00 a.m. to 2:30 p.m. Friday.  Please note that voicemails left after 4:00 p.m. may not be returned until the following business day.  We are closed weekends and all major holidays.  You do have access to a nurse 24-7, just call the main number to the clinic [phone] and do not press any  options, hold on the line and a nurse will answer the phone.    For prescription refill requests, have your pharmacy contact our office and allow 72 hours.    Masks are no longer required in the cancer centers. If you would like for your care team to wear a mask while they are taking care of you, please let them know. You may have one support person who is at least 76 years old accompany you for your appointments.

## 2023-12-25 NOTE — Patient Instructions (Addendum)
 [facility] [place] - A DEPT OF [place] [facility]  Discharge Instructions: Thank you for choosing [facility] to provide your oncology and hematology care.  If you have a lab appointment with the Cancer Center - please note that after April 8th, 2024, all labs will be drawn in the cancer center.  You do not have to check in or register with the main entrance as you have in the past but will complete your check-in in the cancer center.  Wear comfortable clothing and clothing appropriate for easy access to any Portacath or PICC line.   We strive to give you quality time with your provider. You may need to reschedule your appointment if you arrive late (15 or more minutes).  Arriving late affects you and other patients whose appointments are after yours.  Also, if you miss three or more appointments without notifying the office, you may be dismissed from the clinic at the provider's discretion.      For prescription refill requests, have your pharmacy contact our office and allow 72 hours for refills to be completed.    Today you received the following chemotherapy and/or immunotherapy agents: Taxotere , Cytoxan    To help prevent nausea and vomiting after your treatment, we encourage you to take your nausea medication as directed.  BELOW ARE SYMPTOMS THAT SHOULD BE REPORTED IMMEDIATELY: *FEVER GREATER THAN 100.4 F (38 C) OR HIGHER *CHILLS OR SWEATING *NAUSEA AND VOMITING THAT IS NOT CONTROLLED WITH YOUR NAUSEA MEDICATION *UNUSUAL SHORTNESS OF BREATH *UNUSUAL BRUISING OR BLEEDING *URINARY PROBLEMS (pain or burning when urinating, or frequent urination) *BOWEL PROBLEMS (unusual diarrhea, constipation, pain near the anus) TENDERNESS IN MOUTH AND THROAT WITH OR WITHOUT PRESENCE OF ULCERS (sore throat, sores in mouth, or a toothache) UNUSUAL RASH, SWELLING OR PAIN  UNUSUAL VAGINAL DISCHARGE OR ITCHING   Items with * indicate a potential emergency and should be  followed up as soon as possible or go to the Emergency Department if any problems should occur.  Please show the CHEMOTHERAPY ALERT CARD or IMMUNOTHERAPY ALERT CARD at check-in to the Emergency Department and triage nurse.  Should you have questions after your visit or need to cancel or reschedule your appointment, please contact [facility] [place] - A DEPT OF [facility] [phone]  and follow the prompts.  Office hours are 8:00 a.m. to 4:30 p.m. Monday - Friday. Please note that voicemails left after 4:00 p.m. may not be returned until the following business day.  We are closed weekends and major holidays. You have access to a nurse at all times for urgent questions. Please call the main number to the clinic [phone] and follow the prompts.  For any non-urgent questions, you may also contact your provider using MyChart. We now offer e-Visits for anyone 6 and older to request care online for non-urgent symptoms. For details visit [email or web address].   Also download the MyChart app! Go to the app store, search "MyChart", open the app, select [place], and log in with your MyChart username and password.

## 2023-12-25 NOTE — Progress Notes (Signed)
 [facility] [address], [place] [address]   Clinic Day:  12/25/2023  Referring physician: Towanda Fret, MD  Patient Care Team: Towanda Fret, MD as PCP - General [name], MD as PCP - Cardiology (Cardiology) Alyce Jubilee, MD (Inactive) as Consulting Physician (Gastroenterology) [name], MD as Medical Oncologist (Medical Oncology) Gerhard Knuckles, RN as Oncology Nurse Navigator (Medical Oncology) Pappayliou, Arie Begin, DO as Consulting Physician (General Surgery)   ASSESSMENT & PLAN:   Assessment:  1.  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  0.5 cm right axillary lymph node which appears to have lost its fatty hilum and another right axillary lymph node with upper limits of normal cortical thickness 3.7 mm. - Right breast mass biopsy (09/09/2023): Invasive poorly differentiated ductal adenocarcinoma, grade 3, ER/PR negative, HER2 (0+), Ki 67: 30%.  Lymph node could not be biopsied as it was not visible on ultrasound. - PET scan (09/25/2023): Mild metabolic activity in the medial right breast with no evidence of metastatic disease. - Right breast lumpectomy and SLNB on 10/22/2023 - Pathology: 2.1 cm IDC, grade 3, with extensive necrosis, associated DCIS, intermediate grade, margins negative.  LVI negative.  0/7 sentinel lymph nodes involved. - Adjuvant chemotherapy with dose attenuated TC cycle 1 on 12/04/2023  2. Social/Family History: -Lives at home with her granddaughter. Retired from [employer]. No tobacco use. No ETOH consumption. No chemical exposures.  -2 Maternal aunts had breast cancer. Maternal uncle had prostate cancer. Maternal niece had breast cancer.   Plan:  1.  Stage IIa (T2  N0 M0 G3 ER/PR/HER2-) TNBC of the right breast: - She received cycle 1 on 12/04/2023. - She had diarrhea for 1 day and took 1 Imodium.  She was constipated after that.  She also felt bad for 1 day.  Denied any nausea or vomiting. - Reviewed labs from 12/25/2023: Normal LFTs.  Magnesium  is slightly low at 1.6.  CBC grossly normal for treatment.  Will request ferritin and iron panel. - She may proceed with cycle 2 today with 20% dose reduction.  She will receive a udenyca  on pro device.  RTC 3 weeks for follow-up.   2.  Peripheral neuropathy: - Tingling in the fingers of both hands from carpal tunnel is stable.  Denies any tingling in the lower extremities.   Orders Placed This Encounter  Procedures   Ferritin    Standing Status:   Future    Number of Occurrences:   1    Expected Date:   12/25/2023    Expiration Date:   12/24/2024    Release to patient:   Immediate   Iron and TIBC    Standing Status:   Future    Number of Occurrences:   1    Expected Date:   12/25/2023    Expiration Date:   12/24/2024    Release to patient:   Immediate      I,Helena R Teague,acting as a scribe for [name], MD.,have documented all relevant documentation on the behalf of [name], MD,as directed by  [name], MD while in the presence of [name], MD.  I, [name] MD, have reviewed the above documentation for accuracy and completeness, and I agree with  the above.     [name], MD   6/5/202511:08 AM  CHIEF COMPLAINT/PURPOSE OF CONSULT:   Diagnosis: Right breast triple negative breast cancer.   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  VUS in POLE at  p.T41M (c.122C>T).  Report date is 10/09/2023.   The CancerNext-Expanded gene panel offered by [facility] and includes sequencing, rearrangement, and RNA analysis for the following 76 genes: AIP, ALK, APC, ATM, AXIN2, BAP1, BARD1, BMPR1A, BRCA1, BRCA2, BRIP1, CDC73, CDH1, CDK4, CDKN1B, CDKN2A, CEBPA, CHEK2, CTNNA1, DDX41, DICER1, ETV6, FH, FLCN, GATA2, LZTR1, MAX, MBD4, MEN1, MET, MLH1, MSH2, MSH3, MSH6, MUTYH, NF1, NF2, NTHL1, PALB2, PHOX2B, PMS2, POT1, PRKAR1A, PTCH1, PTEN, RAD51C, RAD51D, RB1, RET, RUNX1, SDHA, SDHAF2, SDHB, SDHC, SDHD, SMAD4, SMARCA4, SMARCB1, SMARCE1, STK11, SUFU, TMEM127, TP53, TSC1, TSC2, VHL, and WT1 (sequencing and deletion/duplication); EGFR, HOXB13, KIT, MITF, PDGFRA, POLD1, and POLE (sequencing only); EPCAM and GREM1 (deletion/duplication only).    11/19/2023 Cancer Staging   Staging form: Breast, AJCC 8th Edition - Pathologic stage from 11/19/2023: Stage IIA (pT2, pN0(sn), cM0, G3, ER-, PR-, HER2-) - Signed by [name], MD on 11/19/2023 Histopathologic type: Infiltrating duct carcinoma, NOS Stage prefix: Initial diagnosis Method of lymph node assessment: Sentinel lymph node biopsy Nuclear grade: G3 Multigene prognostic tests performed: None Histologic grading system: 3 grade system Residual tumor (R): R0   12/04/2023 -  Chemotherapy   Patient is on Treatment Plan : BREAST TC q21d         Neville is a 78 y.o. female presenting to clinic today for evaluation of triple negative right breast at the request of Towanda Fret, MD.  Patient underwent bilateral breast MM on 07/07/23 that found: In the right breast, a mass warrants further evaluation. In the left  breast, no findings suspicious for malignancy. Right breast diagnostic MM and US  was done on 08/26/23 that showed: 1.8 cm suspicious mass within the lower inner RIGHT breast. Two slightly abnormal appearing RIGHT axillary lymph nodes.  Lamyia then had a biopsy of the 4 o'clock mass in the right breast on 09/09/23 that revealed: Invasive poorly differentiated ductal adenocarcinoma with extensive necrosis, grade 3. The mass was ER-/PR-/Her2- (0+).  Today, she states that she is doing well overall. Her appetite level is at 100%. Her energy level is at 50%. She is accompanied by her brother and her son and daughter on the phone.   Chele had menarche around age 89 and menopause in her early 54's. She was given some sort of medication for a year to help with hot flashes. She is unaware if she has taken hormone replacements. Kyia took birth control for 2-3 years. She has 2 children with her first childbirth at age 46.   Luevenia denies any unintentional weight loss, new onset pains, or peripheral neuropathy. She has no prior history of breast biopsy. She has heart flutters, but denies any MI's, TIA's, or CVA's. Back pain is chronic.  Laure reports intermittent ankle swellings, she attributes to 2 total knee arthroplasties in 2005.   INTERVAL HISTORY:   ADELENE POLIVKA is a 78 y.o. female presenting to the clinic today for follow-up of triple negative right breast cancer. She was last seen by me on 11/19/23.  Today, she states that she is doing well overall. Her appetite level is at 50%. Her energy level is at 50%. Demira is accompanied by a family member.   She notes stable numbness and tingling in the hands attributable to carpal tunnel. She denies any neuropathy in the feet or ankle swellings. Lashan denies any nausea or vomiting after treatment on 12/04/23. She states she had diarrhea and felt heated for 1 day that occurred 2 days after treatment. Diarrhea was treated with Imodium, though she states she will  not take it again as it caused constipation. She has lost some of her hair. She denies any aches or pains. She notes a decreased appetite though her weight remains stable.    Marieann still resides with her granddaughter. She reports nausea during her pregnancies.   PAST MEDICAL HISTORY:   Past Medical History: 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   Depression    Diverticulosis of colon    GERD (gastroesophageal reflux disease)    Hyperlipidemia    Hypertension    Obesity    Osteoarthritis of knees, bilateral    Sleep apnea     Surgical History: Past Surgical History:  Procedure Laterality Date   ABDOMINAL HYSTERECTOMY     AXILLARY SENTINEL NODE BIOPSY Right 10/22/2023   Procedure: BIOPSY, LYMPH NODE, SENTINEL, AXILLARY;  Surgeon: Marijo Shove, DO;  Location: [facility];  Service: General;  Laterality: Right;   BREAST BIOPSY Right 09/09/2023   US  RT BREAST BX W LOC DEV 1ST LESION IMG BX SPEC US  GUIDE 09/09/2023 Alger Infield, MD [facility]-ULTRASOUND   BREAST LUMPECTOMY WITH RADIO FREQUENCY LOCALIZER Right 10/22/2023   Procedure: BREAST LUMPECTOMY WITH RADIO FREQUENCY LOCALIZER;  Surgeon: Marijo Shove, DO;  Location: [facility];  Service: General;  Laterality: Right;   COLONOSCOPY  07/23/2003   Dr. Fredericka James Smith:numerous large scattered diverticula   COLONOSCOPY N/A 11/15/2013   Dr. Nolene Baumgarten: moderate diverticula, small internal hemorrhoids, redudant colon. Next TCS in 2025 with overtube.    ESOPHAGOGASTRODUODENOSCOPY (EGD) WITH ESOPHAGEAL DILATION N/A 11/15/2013   Dr. Nolene Baumgarten: stricture at GE junction s/p dilation. moderate erosive gastritis, negative H.pylori   MULTIPLE TOOTH EXTRACTIONS  2024   PORTACATH PLACEMENT Left 10/22/2023   Procedure: INSERTION, TUNNELED CENTRAL VENOUS DEVICE, WITH PORT;  Surgeon: Marijo Shove, DO;  Location: [facility];  Service: General;  Laterality: Left;   TOTAL KNEE ARTHROPLASTY   06/01/2004   left / Dr. Phyllis Breeze   TOTAL KNEE ARTHROPLASTY  11/29/2003   right / Dr. Phyllis Breeze   TUBAL LIGATION      Social History: Social History   Socioeconomic History   Marital status: Widowed    Spouse name: Griselda Bramblett   Number of children: 2   Years of education: 12   Highest education level: 12th grade  Occupational History   Occupation: retired     Associate Professor: UNEMPLOYED  Tobacco Use   Smoking status: Never    Passive exposure: Never   Smokeless tobacco: Never  Vaping Use   Vaping status: Never Used  Substance and Sexual Activity   Alcohol use: No   Drug  use: No   Sexual activity: Not Currently    Partners: Male    Birth control/protection: Surgical  Other Topics Concern   Not on file  Social History Narrative   Not on file   Social Drivers of Health   Financial Resource Strain: Low Risk  (08/12/2023)   Overall Financial Resource Strain (CARDIA)    Difficulty of Paying Living Expenses: Not hard at all  Food Insecurity: No Food Insecurity (08/12/2023)   Hunger Vital Sign    Worried About Running Out of Food in the Last Year: Never true    Ran Out of Food in the Last Year: Never true  Transportation Needs: No Transportation Needs (08/12/2023)   PRAPARE - Administrator, Civil Service (Medical): No    Lack of Transportation (Non-Medical): No  Physical Activity: Sufficiently Active (08/12/2023)   Exercise Vital Sign    Days of Exercise per Week: 5 days    Minutes of Exercise per Session: 50 min  Stress: No Stress Concern Present (08/12/2023)   [employer] of Occupational Health - Occupational Stress Questionnaire    Feeling of Stress : Only a little  Social Connections: Moderately Integrated (08/12/2023)   Social Connection and Isolation Panel [NHANES]    Frequency of Communication with Friends and Family: More than three times a week    Frequency of Social Gatherings with Friends and Family: More than three times a week    Attends  Religious Services: More than 4 times per year    Active Member of [employer] or Organizations: Yes    Attends Banker Meetings: More than 4 times per year    Marital Status: Widowed  Intimate Partner Violence: Not At Risk (08/12/2023)   Humiliation, Afraid, Rape, and Kick questionnaire    Fear of Current or Ex-Partner: No    Emotionally Abused: No    Physically Abused: No    Sexually Abused: No    Family History: Family History  Problem Relation Age of Onset   Diabetes Mother    Hypertension Mother    Heart disease Mother    Aneurysm Father    Diabetes Father    Hypertension Brother    Diabetes Brother    Diabetes Brother    Hypertension Brother    Breast cancer Maternal Aunt        dx >50   Cancer Maternal Aunt        unknown type; ? breast; dx >50?   Prostate cancer Maternal Uncle    Hypertension Son     Current Medications:  Current Outpatient Medications:    albuterol  (VENTOLIN  HFA) 108 (90 Base) MCG/ACT inhaler, Inhale 2 puffs into the lungs every 6 (six) hours as needed for wheezing or shortness of breath., Disp: 8.5 each, Rfl: 1   amLODipine  (NORVASC ) 10 MG tablet, TAKE 1 TABLET BY MOUTH DAILY, Disp: 100 tablet, Rfl: 2   aspirin EC 81 MG tablet, Take 81 mg by mouth daily. Swallow whole., Disp: , Rfl:    atorvastatin  (LIPITOR) 20 MG tablet, Take 1 tablet (20 mg total) by mouth daily., Disp: 90 tablet, Rfl: 3   BIOTIN PO, Take 1 tablet by mouth daily., Disp: , Rfl:    Cholecalciferol (VITAMIN D3) 125 MCG (5000 UT) CAPS, Take 1 capsule (5,000 Units total) by mouth daily., Disp: 30 capsule, Rfl: 11   cloNIDine  (CATAPRES ) 0.2 MG tablet, Take 1 tablet (0.2 mg total) by mouth daily., Disp: 90 tablet, Rfl: 0   clotrimazole -betamethasone  (LOTRISONE ) cream,  Apply cream twice daily to rash in groin for 10 days, then as needed, Disp: 45 g, Rfl: 1   cycloPHOSphamide  (CYTOXAN  IJ), Inject as directed., Disp: , Rfl:    dexamethasone  10 mg in sodium chloride  0.9 % 50 mL,  Inject 10 mg into the vein., Disp: , Rfl:    DOCEtaxel  (TAXOTERE  IV), Inject into the vein., Disp: , Rfl:    docusate sodium  (COLACE) 100 MG capsule, Take 1 capsule (100 mg total) by mouth 2 (two) times daily., Disp: 60 capsule, Rfl: 2   famotidine (PEPCID) 10 MG tablet, Take 10 mg by mouth 2 (two) times daily., Disp: , Rfl:    furosemide  (LASIX ) 40 MG tablet, TAKE 1 TABLET BY MOUTH DAILY AS NEEDED FOR EDEMA OR FLUID., Disp: 30 tablet, Rfl: 2   gabapentin  (NEURONTIN ) 100 MG capsule, Take 1 capsule (100 mg total) by mouth at bedtime., Disp: 30 capsule, Rfl: 3   gabapentin  (NEURONTIN ) 300 MG capsule, TAKE 1 CAPSULE BY MOUTH AT  BEDTIME, Disp: 100 capsule, Rfl: 2   Hydrocortisone (GERHARDT'S BUTT CREAM) CREA, Apply 1 application topically 3 (three) times daily., Disp: 1 each, Rfl: 1   lidocaine -prilocaine  (EMLA ) cream, Apply to affected area once, Disp: 30 g, Rfl: 3   losartan -hydrochlorothiazide  (HYZAAR) 100-25 MG tablet, Take 1 tablet by mouth daily., Disp: 30 tablet, Rfl: 5   magnesium  30 MG tablet, Take one tablet by mouth once daily, Disp: 30 tablet, Rfl: 2   Melatonin 3 MG CAPS, Take 1 capsule (3 mg total) by mouth at bedtime., Disp: 30 capsule, Rfl: 3   metoprolol  succinate (TOPROL -XL) 50 MG 24 hr tablet, TAKE 1 TABLET BY MOUTH  DAILY WITH OR IMMEDIATELY  FOLLOWING A MEAL, Disp: 100 tablet, Rfl: 2   olopatadine  (PATANOL) 0.1 % ophthalmic solution, Place 1 drop into both eyes 2 (two) times daily., Disp: 5 mL, Rfl: 6   omeprazole  (PRILOSEC) 20 MG capsule, Take 1 capsule (20 mg total) by mouth daily., Disp: 100 capsule, Rfl: 2   ondansetron  (ZOFRAN ) 4 MG tablet, Take 1 tablet (4 mg total) by mouth daily as needed for nausea or vomiting., Disp: 30 tablet, Rfl: 1   oxyCODONE  (ROXICODONE ) 5 MG immediate release tablet, Take 1 tablet (5 mg total) by mouth every 6 (six) hours as needed., Disp: 8 tablet, Rfl: 0   Palonosetron  HCl (ALOXI  IV), Inject into the vein., Disp: , Rfl:    potassium chloride  SA  (KLOR-CON  M) 20 MEQ tablet, Take 1 tablet (20 mEq total) by mouth daily., Disp: 100 tablet, Rfl: 2   prochlorperazine  (COMPAZINE ) 10 MG tablet, Take 1 tablet (10 mg total) by mouth every 6 (six) hours as needed for nausea or vomiting., Disp: 60 tablet, Rfl: 3   pyridOXINE  (VITAMIN B-6) 100 MG tablet, Take 200 mg by mouth daily., Disp: , Rfl:    sertraline  (ZOLOFT ) 50 MG tablet, Take 1 tablet (50 mg total) by mouth daily., Disp: 30 tablet, Rfl: 3   temazepam  (RESTORIL ) 15 MG capsule, Take 1 capsule (15 mg total) by mouth at bedtime as needed for sleep., Disp: 30 capsule, Rfl: 5   Allergies: No Known Allergies  REVIEW OF SYSTEMS:   Review of Systems  Constitutional:  Negative for chills, fatigue and fever.  HENT:   Positive for trouble swallowing. Negative for lump/mass, mouth sores, nosebleeds and sore throat.   Eyes:  Negative for eye problems.  Respiratory:  Positive for shortness of breath. Negative for cough.   Cardiovascular:  Negative for chest pain,  leg swelling and palpitations.  Gastrointestinal:  Negative for abdominal pain, constipation, diarrhea, nausea and vomiting.  Genitourinary:  Negative for bladder incontinence, difficulty urinating, dysuria, frequency, hematuria and nocturia.   Musculoskeletal:  Negative for arthralgias, back pain, flank pain, myalgias and neck pain.       +right breast pain, 5/10 severity  Skin:  Negative for itching and rash.  Neurological:  Negative for dizziness, headaches and numbness.  Hematological:  Does not bruise/bleed easily.  Psychiatric/Behavioral:  Positive for sleep disturbance. Negative for depression and suicidal ideas. The patient is not nervous/anxious.   All other systems reviewed and are negative.    VITALS:   There were no vitals taken for this visit.  Wt Readings from Last 3 Encounters:  12/25/23 185 lb 6.4 oz (84.1 kg)  12/04/23 187 lb 1.6 oz (84.9 kg)  11/19/23 183 lb 1.6 oz (83.1 kg)    There is no height or weight on  file to calculate BMI.  Performance status (ECOG): 1 - Symptomatic but completely ambulatory  PHYSICAL EXAM:   Physical Exam Vitals and nursing note reviewed. Exam conducted with a chaperone present.  Constitutional:      Appearance: Normal appearance.  Cardiovascular:     Rate and Rhythm: Normal rate and regular rhythm.     Pulses: Normal pulses.     Heart sounds: Normal heart sounds.  Pulmonary:     Effort: Pulmonary effort is normal.     Breath sounds: Normal breath sounds.  Abdominal:     Palpations: Abdomen is soft. There is no hepatomegaly, splenomegaly or mass.     Tenderness: There is no abdominal tenderness.  Musculoskeletal:     Right lower leg: No edema.     Left lower leg: No edema.  Lymphadenopathy:     Cervical: No cervical adenopathy.     Right cervical: No superficial, deep or posterior cervical adenopathy.    Left cervical: No superficial, deep or posterior cervical adenopathy.     Upper Body:     Right upper body: No supraclavicular or axillary adenopathy.     Left upper body: No supraclavicular or axillary adenopathy.  Neurological:     General: No focal deficit present.     Mental Status: She is alert and oriented to person, place, and time.  Psychiatric:        Mood and Affect: Mood normal.        Behavior: Behavior normal.   Breast Exam Chaperone: Franco Isaac, RN   LABS:   CBC    Component Value Date/Time   WBC 10.8 (H) 12/25/2023 1001   RBC 3.73 (L) 12/25/2023 1001   HGB 10.5 (L) 12/25/2023 1001   HGB 12.0 01/31/2023 1145   HCT 33.9 (L) 12/25/2023 1001   HCT 37.0 01/31/2023 1145   PLT 242 12/25/2023 1001   PLT 204 01/31/2023 1145   MCV 90.9 12/25/2023 1001   MCV 92 01/31/2023 1145   MCH 28.2 12/25/2023 1001   MCHC 31.0 12/25/2023 1001   RDW 14.6 12/25/2023 1001   RDW 12.6 01/31/2023 1145   LYMPHSABS 2.4 12/25/2023 1001   LYMPHSABS 5.4 (H) 12/12/2022 1033   MONOABS 0.8 12/25/2023 1001   EOSABS 0.1 12/25/2023 1001   EOSABS 0.2  12/12/2022 1033   BASOSABS 0.2 (H) 12/25/2023 1001   BASOSABS 0.1 12/12/2022 1033    CMP    Component Value Date/Time   NA 137 12/25/2023 1001   NA 143 06/27/2023 1155   K 3.8 12/25/2023 1001  CL 104 12/25/2023 1001   CO2 24 12/25/2023 1001   GLUCOSE 135 (H) 12/25/2023 1001   BUN 17 12/25/2023 1001   BUN 17 06/27/2023 1155   CREATININE 0.75 12/25/2023 1001   CREATININE 0.78 02/15/2020 0848   CALCIUM  9.0 12/25/2023 1001   PROT 6.6 12/25/2023 1001   PROT 7.6 06/27/2023 1155   ALBUMIN 3.0 (L) 12/25/2023 1001   ALBUMIN 4.1 06/27/2023 1155   AST 21 12/25/2023 1001   ALT 14 12/25/2023 1001   ALKPHOS 70 12/25/2023 1001   BILITOT 0.5 12/25/2023 1001   BILITOT 0.6 06/27/2023 1155   GFRNONAA >60 12/25/2023 1001   GFRNONAA 76 02/15/2020 0848   GFRAA 89 08/15/2020 0931   GFRAA 88 02/15/2020 0848     No results found for: "CEA1", "CEA" / No results found for: "CEA1", "CEA" No results found for: "PSA1" No results found for: "ZOX096" No results found for: "CAN125"  No results found for: "TOTALPROTELP", "ALBUMINELP", "A1GS", "A2GS", "BETS", "BETA2SER", "GAMS", "MSPIKE", "SPEI" No results found for: "TIBC", "FERRITIN", "IRONPCTSAT" No results found for: "LDH"   STUDIES:   No results found.

## 2023-12-25 NOTE — Progress Notes (Signed)
..  [name] arrived today for [facility] neulasta  on body injector. See MAR for administration details. Injector in place and engaged with green light indicator on flashing. Tolerated application with out problems. On at 1430  Treatment given per orders. Patient tolerated it well without problems. Vitals stable and discharged home from clinic via wheelchair. Follow up as scheduled.

## 2023-12-26 ENCOUNTER — Inpatient Hospital Stay

## 2023-12-29 ENCOUNTER — Telehealth: Payer: Self-pay

## 2023-12-29 NOTE — Telephone Encounter (Signed)
 Notified the pt family member regarding their FMLA  form being completed ,faxed, and confirmation received. Pt will pick up their cop at [facility] 01/15/2024. No questions or concerns at this time.

## 2023-12-30 ENCOUNTER — Telehealth: Payer: Self-pay | Admitting: Pulmonary Disease

## 2023-12-30 ENCOUNTER — Other Ambulatory Visit: Payer: Self-pay

## 2023-12-30 NOTE — Telephone Encounter (Signed)
 CMN signed, faxed, fax conformation attached and sent to scan.

## 2024-01-13 ENCOUNTER — Other Ambulatory Visit: Payer: Self-pay

## 2024-01-14 ENCOUNTER — Encounter: Payer: Self-pay | Admitting: Hematology

## 2024-01-14 ENCOUNTER — Telehealth: Payer: Self-pay

## 2024-01-14 NOTE — Telephone Encounter (Signed)
 Copied from CRM [id number]. Topic: Appointments - Appointment Info/Confirmation >> Jan 14, 2024 11:57 AM Vena DEL wrote: Pt's son, [name] is calling in with a question about his mom's appointment tomorrow. He would like a call back . He can be reached at [phone]   ----------------------------------------------------------------------- From previous Reason for Contact - Medical Advice: Reason for CRM: Pt isn

## 2024-01-15 ENCOUNTER — Inpatient Hospital Stay

## 2024-01-15 ENCOUNTER — Encounter: Payer: Self-pay | Admitting: Family Medicine

## 2024-01-15 ENCOUNTER — Telehealth: Admitting: Family Medicine

## 2024-01-15 ENCOUNTER — Inpatient Hospital Stay: Admitting: Hematology

## 2024-01-15 ENCOUNTER — Telehealth: Payer: Self-pay

## 2024-01-15 VITALS — BP 102/53 | HR 66 | Temp 97.6°F | Resp 18

## 2024-01-15 VITALS — BP 96/45 | HR 62 | Temp 97.7°F | Resp 20 | Wt 178.8 lb

## 2024-01-15 DIAGNOSIS — K5900 Constipation, unspecified: Secondary | ICD-10-CM | POA: Insufficient documentation

## 2024-01-15 DIAGNOSIS — F32A Depression, unspecified: Secondary | ICD-10-CM | POA: Diagnosis not present

## 2024-01-15 DIAGNOSIS — C50911 Malignant neoplasm of unspecified site of right female breast: Secondary | ICD-10-CM

## 2024-01-15 DIAGNOSIS — C50311 Malignant neoplasm of lower-inner quadrant of right female breast: Secondary | ICD-10-CM

## 2024-01-15 DIAGNOSIS — I10 Essential (primary) hypertension: Secondary | ICD-10-CM

## 2024-01-15 DIAGNOSIS — R600 Localized edema: Secondary | ICD-10-CM | POA: Diagnosis not present

## 2024-01-15 DIAGNOSIS — F5105 Insomnia due to other mental disorder: Secondary | ICD-10-CM

## 2024-01-15 DIAGNOSIS — F409 Phobic anxiety disorder, unspecified: Secondary | ICD-10-CM

## 2024-01-15 DIAGNOSIS — Z171 Estrogen receptor negative status [ER-]: Secondary | ICD-10-CM | POA: Diagnosis not present

## 2024-01-15 DIAGNOSIS — E785 Hyperlipidemia, unspecified: Secondary | ICD-10-CM

## 2024-01-15 DIAGNOSIS — Z17421 Hormone receptor negative with human epidermal growth factor receptor 2 negative status: Secondary | ICD-10-CM | POA: Diagnosis not present

## 2024-01-15 DIAGNOSIS — Z5189 Encounter for other specified aftercare: Secondary | ICD-10-CM | POA: Diagnosis not present

## 2024-01-15 DIAGNOSIS — F419 Anxiety disorder, unspecified: Secondary | ICD-10-CM | POA: Diagnosis not present

## 2024-01-15 DIAGNOSIS — Z5111 Encounter for antineoplastic chemotherapy: Secondary | ICD-10-CM | POA: Diagnosis not present

## 2024-01-15 DIAGNOSIS — Z95828 Presence of other vascular implants and grafts: Secondary | ICD-10-CM

## 2024-01-15 LAB — CBC WITH DIFFERENTIAL/PLATELET
Abs Immature Granulocytes: 0.04 10*3/uL (ref 0.00–0.07)
Basophils Absolute: 0.1 10*3/uL (ref 0.0–0.1)
Basophils Relative: 1 %
Eosinophils Absolute: 0.1 10*3/uL (ref 0.0–0.5)
Eosinophils Relative: 1 %
HCT: 30.3 % — ABNORMAL LOW (ref 36.0–46.0)
Hemoglobin: 9.7 g/dL — ABNORMAL LOW (ref 12.0–15.0)
Immature Granulocytes: 0 %
Lymphocytes Relative: 21 %
Lymphs Abs: 2.1 10*3/uL (ref 0.7–4.0)
MCH: 28.6 pg (ref 26.0–34.0)
MCHC: 32 g/dL (ref 30.0–36.0)
MCV: 89.4 fL (ref 80.0–100.0)
Monocytes Absolute: 0.8 10*3/uL (ref 0.1–1.0)
Monocytes Relative: 8 %
Neutro Abs: 6.7 10*3/uL (ref 1.7–7.7)
Neutrophils Relative %: 69 %
Platelets: 297 10*3/uL (ref 150–400)
RBC: 3.39 MIL/uL — ABNORMAL LOW (ref 3.87–5.11)
RDW: 16.6 % — ABNORMAL HIGH (ref 11.5–15.5)
WBC: 9.9 10*3/uL (ref 4.0–10.5)
nRBC: 0 % (ref 0.0–0.2)

## 2024-01-15 LAB — COMPREHENSIVE METABOLIC PANEL WITH GFR
ALT: 13 U/L (ref 0–44)
AST: 23 U/L (ref 15–41)
Albumin: 3.2 g/dL — ABNORMAL LOW (ref 3.5–5.0)
Alkaline Phosphatase: 63 U/L (ref 38–126)
Anion gap: 13 (ref 5–15)
BUN: 18 mg/dL (ref 8–23)
CO2: 23 mmol/L (ref 22–32)
Calcium: 9.6 mg/dL (ref 8.9–10.3)
Chloride: 100 mmol/L (ref 98–111)
Creatinine, Ser: 1.14 mg/dL — ABNORMAL HIGH (ref 0.44–1.00)
GFR, Estimated: 50 mL/min — ABNORMAL LOW (ref >60)
Glucose, Bld: 138 mg/dL — ABNORMAL HIGH (ref 70–99)
Potassium: 4.1 mmol/L (ref 3.5–5.1)
Sodium: 136 mmol/L (ref 135–145)
Total Bilirubin: 0.5 mg/dL (ref 0.0–1.2)
Total Protein: 6.5 g/dL (ref 6.5–8.1)

## 2024-01-15 LAB — MAGNESIUM: Magnesium: 1.7 mg/dL (ref 1.7–2.4)

## 2024-01-15 MED ORDER — SODIUM CHLORIDE 0.9 % IV SOLN
INTRAVENOUS | Status: DC
Start: 2024-01-15 — End: 2024-01-15

## 2024-01-15 MED ORDER — POLYETHYLENE GLYCOL 3350 17 GM/SCOOP PO POWD: 17.0000 g | Freq: Two times a day (BID) | ORAL | 3 refills | Status: AC | PRN

## 2024-01-15 MED ORDER — PALONOSETRON HCL INJECTION 0.25 MG/5ML
0.2500 mg | Freq: Once | INTRAVENOUS | Status: AC
  Administered 2024-01-15: 0.25 mg via INTRAVENOUS
  Filled 2024-01-15: qty 5

## 2024-01-15 MED ORDER — CLONIDINE HCL 0.2 MG PO TABS: 0.2000 mg | ORAL_TABLET | Freq: Every day | ORAL | 1 refills | Status: DC

## 2024-01-15 MED ORDER — FUROSEMIDE 40 MG PO TABS
ORAL_TABLET | ORAL | 3 refills | Status: DC
Start: 2024-01-15 — End: 2024-02-03

## 2024-01-15 MED ORDER — SODIUM CHLORIDE 0.9 % IV SOLN
400.0000 mg/m2 | Freq: Once | INTRAVENOUS | Status: AC
  Administered 2024-01-15: 760 mg via INTRAVENOUS
  Filled 2024-01-15: qty 38

## 2024-01-15 MED ORDER — DEXAMETHASONE SODIUM PHOSPHATE 10 MG/ML IJ SOLN
10.0000 mg | Freq: Once | INTRAMUSCULAR | Status: AC
  Administered 2024-01-15: 10 mg via INTRAVENOUS
  Filled 2024-01-15: qty 1

## 2024-01-15 MED ORDER — SODIUM CHLORIDE 0.9 % IV SOLN
50.0000 mg/m2 | Freq: Once | INTRAVENOUS | Status: AC
  Administered 2024-01-15: 96 mg via INTRAVENOUS
  Filled 2024-01-15: qty 9.6

## 2024-01-15 MED ORDER — SODIUM CHLORIDE 0.9% FLUSH
10.0000 mL | INTRAVENOUS | Status: DC | PRN
  Administered 2024-01-15: 10 mL

## 2024-01-15 MED ORDER — PEGFILGRASTIM-CBQV (INF DEV) 6 MG/0.6ML ~~LOC~~ SOSY
6.0000 mg | PREFILLED_SYRINGE | Freq: Once | SUBCUTANEOUS | Status: AC
  Administered 2024-01-15: 6 mg via SUBCUTANEOUS
  Filled 2024-01-15: qty 0.6

## 2024-01-15 MED ORDER — MEGESTROL ACETATE 400 MG/10ML PO SUSP: 400.0000 mg | Freq: Two times a day (BID) | ORAL | 0 refills | Status: DC

## 2024-01-15 MED ORDER — HEPARIN SOD (PORK) LOCK FLUSH 100 UNIT/ML IV SOLN
500.0000 [IU] | Freq: Once | INTRAVENOUS | Status: AC | PRN
  Administered 2024-01-15: 500 [IU]

## 2024-01-15 MED ORDER — SODIUM CHLORIDE 0.9% FLUSH
10.0000 mL | INTRAVENOUS | Status: DC | PRN
  Administered 2024-01-15: 10 mL via INTRAVENOUS

## 2024-01-15 MED ORDER — BISACODYL EC 5 MG PO TBEC: DELAYED_RELEASE_TABLET | ORAL | 0 refills | Status: AC

## 2024-01-15 NOTE — Assessment & Plan Note (Signed)
 Depression controlled son sertraline  25 m daily Anxietu uncontrolle d,will refer to Therapist Bynum

## 2024-01-15 NOTE — Progress Notes (Signed)
 Patient presents today for chemotherapy infusion of Docetaxul and Cytoxan . Patient also to get ONPRO today. Patient is in satisfactory condition with no new complaints voiced.  Vital signs are stable.  Labs reviewed by Dr. Rogers during the office visit and all labs are within treatment parameters.  We will proceed with treatment per MD orders.   Patient tolerated treatment well with no complaints voiced.  Udenyca  Onpro placed on abd, patient tolerated well and verbalized understanding of use. Patient left via wheelchair in stable condition.  Vital signs stable at discharge.  Follow up as scheduled.

## 2024-01-15 NOTE — Patient Instructions (Addendum)
 Follow-up in 4 months, call if you need me sooner.  Dose of medication for sleep is increased to 15 mg , one  capsule at bedtime.  As far as anxiety and stress is concerned tried to stop worrying and being anxious about things you have no control over so that you can put all of your energy into positively healing from the cancer that you are being treated for.   I understand this is difficult.   I do recommend you have a frank discussion with your granddaughter and explain what she will need to do to be able to continue to live in your home .[name]  I will reach out to Ms. Bynum for an appointment for  you .  Her office will call with that appointment information.  For constipation Commit to daily stool softeners .   New is daily MiraLAX 1 serving every day.    I have sent in a prescription for Dulcolax tablets to take 1 every 3 days if you do not have a bowel movement and repeated after 6 hours if you still do not have a bowel movement.  Good that you are near the end of the chemotherapy   Continue to do at least 2 Boost or Ensure drinks every day as well as eating as best able.  Thanks for choosing [facility], we consider it a privelige to serve you.

## 2024-01-15 NOTE — Assessment & Plan Note (Signed)
Hyperlipidemia:Low fat diet discussed and encouraged.   Lipid Panel  Lab Results  Component Value Date   CHOL 150 11/28/2022   HDL 57 11/28/2022   LDLCALC 77 11/28/2022   TRIG 83 11/28/2022   CHOLHDL 2.6 11/28/2022     Controlled, no change in medication

## 2024-01-15 NOTE — Progress Notes (Signed)
 Virtual Visit via Video Note  I connected with [name] on 01/15/24 at  3:20 PM EDT by a video enabled telemedicine application and verified that I am speaking with the correct person using two identifiers.  Location: Patient: car Provider: office   I discussed the limitations of evaluation and management by telemedicine and the availability of in person appointments. The patient expressed understanding and agreed to proceed.  History of Present Illness: Currentlyu receiving chemothrapy which will be followed by radiation treatment for breast cancer diagnosed this year. States shemo nmakes her weak and with poor apppetie, got half dose today and has one more Supplementing with Boost and ensure C/o poor sleep C/o anxiety  mainly because of family issue, awaiting transfer back to her former therapist C/O constipation    Observations/Objective: There were no vitals taken for this visit. Good communication with no confusion and intact memory. Alert and oriented x 3 No signs of respiratory distress during speech   Assessment and Plan: Invasive ductal carcinoma of breast, female, right (HCC) Currently near completion of chemotherapy, and is to stsrt radiation treatment Feels weak and not doing well with chemo but battling through this. Son, [name] is her caregiver   Anxiety and depression Depression controlled son sertraline  25 m daily Anxietu uncontrolle d,will refer to Therapist [facility]  Essential hypertension Appears over corrected , wiill message son to verify meds with Nurse in am , prior to any med chamge DASH diet and commitment to daily physical activity for a minimum of 30 minutes discussed and encouraged, as a part of hypertension management. The importance of attaining a healthy weight is also discussed.     01/15/2024    2:29 PM 01/15/2024    9:56 AM 12/25/2023    2:16 PM 12/25/2023    9:54 AM 12/04/2023    3:31 PM 12/04/2023    2:17 PM 12/04/2023    2:00 PM   BP/Weight  Systolic BP 102 96 126 123 183 159 129  Diastolic BP 53 45 43 52 71 77 62  Wt. (Lbs)  178.79  185.4     BMI  32.7 kg/m2  33.91 kg/m2       '  Hyperlipidemia LDL goal <100 Hyperlipidemia:Low fat diet discussed and encouraged.   Lipid Panel  Lab Results  Component Value Date   CHOL 150 11/28/2022   HDL 57 11/28/2022   LDLCALC 77 11/28/2022   TRIG 83 11/28/2022   CHOLHDL 2.6 11/28/2022     Controlled, no change in medication   Insomnia due to anxiety and fear Sleep hygiene reviewed and written information offered also. Prescription sent for  medication needed. Uncontrolled inc dose of restoril   Constipation Add daily miralax and dulcolax every 3 days if needed,colntinue daily colace   Follow Up Instructions:    I discussed the assessment and treatment plan with the patient. The patient was provided an opportunity to ask questions and all were answered. The patient agreed with the plan and demonstrated an understanding of the instructions.   The patient was advised to call back or seek an in-person evaluation if the symptoms worsen or if the condition fails to improve as anticipated.  I provided 24 minutes of non-face-to-face time during this encounter.   Rollene Pesa, MD

## 2024-01-15 NOTE — Progress Notes (Signed)
 Patients port flushed without difficulty.  Good blood return noted with no bruising or swelling noted at site.  Patient remains accessed for treatment.

## 2024-01-15 NOTE — Assessment & Plan Note (Signed)
 Sleep hygiene reviewed and written information offered also. Prescription sent for  medication needed. Uncontrolled inc dose of restoril 

## 2024-01-15 NOTE — Progress Notes (Signed)
 Patient has been examined by Dr. Ellin Saba. Vital signs and labs have been reviewed by MD - ANC, Creatinine, LFTs, hemoglobin, and platelets are within treatment parameters per M.D. - pt may proceed with treatment.  Primary RN and pharmacy notified.

## 2024-01-15 NOTE — Assessment & Plan Note (Signed)
 Appears over corrected , wiill message son to verify meds with Nurse in am , prior to any med chamge DASH diet and commitment to daily physical activity for a minimum of 30 minutes discussed and encouraged, as a part of hypertension management. The importance of attaining a healthy weight is also discussed.     01/15/2024    2:29 PM 01/15/2024    9:56 AM 12/25/2023    2:16 PM 12/25/2023    9:54 AM 12/04/2023    3:31 PM 12/04/2023    2:17 PM 12/04/2023    2:00 PM  BP/Weight  Systolic BP 102 96 126 123 183 159 129  Diastolic BP 53 45 43 52 71 77 62  Wt. (Lbs)  178.79  185.4     BMI  32.7 kg/m2  33.91 kg/m2       '

## 2024-01-15 NOTE — Patient Instructions (Signed)
 [facility] - A DEPT OF [facility]  Discharge Instructions: Thank you for choosing [facility] to provide your oncology and hematology care.  If you have a lab appointment with the Cancer Center - please note that after April 8th, 2024, all labs will be drawn in the cancer center.  You do not have to check in or register with the main entrance as you have in the past but will complete your check-in in the cancer center.  Wear comfortable clothing and clothing appropriate for easy access to any Portacath or PICC line.   We strive to give you quality time with your provider. You may need to reschedule your appointment if you arrive late (15 or more minutes).  Arriving late affects you and other patients whose appointments are after yours.  Also, if you miss three or more appointments without notifying the office, you may be dismissed from the clinic at the provider's discretion.      For prescription refill requests, have your pharmacy contact our office and allow 72 hours for refills to be completed.    Today you received the following chemotherapy and/or immunotherapy agents Docetaxel , Cytoxan , and Udenyca  Onpro.  Pegfilgrastim  Injection What is this medication? PEGFILGRASTIM  (PEG fil gra stim) lowers the risk of infection in people who are receiving chemotherapy. It works by Systems analyst make more white blood cells, which protects your body from infection. It may also be used to help people who have been exposed to high doses of radiation. This medicine may be used for other purposes; ask your health care provider or pharmacist if you have questions. COMMON BRAND NAME(S): Fulphila , Fylnetra , Neulasta , Nyvepria , Stimufend , UDENYCA , UDENYCA  ONBODY, Ziextenzo  What should I tell my care team before I take this medication? They need to know if you have any of these conditions: Kidney disease Latex allergy Ongoing radiation therapy Sickle cell  disease Skin reactions to acrylic adhesives (On-Body Injector only) An unusual or allergic reaction to pegfilgrastim , filgrastim, other medications, foods, dyes, or preservatives Pregnant or trying to get pregnant Breast-feeding How should I use this medication? This medication is for injection under the skin. If you get this medication at home, you will be taught how to prepare and give the pre-filled syringe or how to use the On-body Injector. Refer to the patient Instructions for Use for detailed instructions. Use exactly as directed. Tell your care team immediately if you suspect that the On-body Injector may not have performed as intended or if you suspect the use of the On-body Injector resulted in a missed or partial dose. It is important that you put your used needles and syringes in a special sharps container. Do not put them in a trash can. If you do not have a sharps container, call your pharmacist or care team to get one. Talk to your care team about the use of this medication in children. While this medication may be prescribed for selected conditions, precautions do apply. Overdosage: If you think you have taken too much of this medicine contact a poison control center or emergency room at once. NOTE: This medicine is only for you. Do not share this medicine with others. What if I miss a dose? It is important not to miss your dose. Call your care team if you miss your dose. If you miss a dose due to an On-body Injector failure or leakage, a new dose should be administered as soon as possible using a  single prefilled syringe for manual use. What may interact with this medication? Interactions have not been studied. This list may not describe all possible interactions. Give your health care provider a list of all the medicines, herbs, non-prescription drugs, or dietary supplements you use. Also tell them if you smoke, drink alcohol, or use illegal drugs. Some items may interact with your  medicine. What should I watch for while using this medication? Your condition will be monitored carefully while you are receiving this medication. You may need blood work done while you are taking this medication. Talk to your care team about your risk of cancer. You may be more at risk for certain types of cancer if you take this medication. If you are going to need a MRI, CT scan, or other procedure, tell your care team that you are using this medication (On-Body Injector only). What side effects may I notice from receiving this medication? Side effects that you should report to your care team as soon as possible: Allergic reactions--skin rash, itching, hives, swelling of the face, lips, tongue, or throat Capillary leak syndrome--stomach or muscle pain, unusual weakness or fatigue, feeling faint or lightheaded, decrease in the amount of urine, swelling of the ankles, hands, or feet, trouble breathing High white blood cell level--fever, fatigue, trouble breathing, night sweats, change in vision, weight loss Inflammation of the aorta--fever, fatigue, back, chest, or stomach pain, severe headache Kidney injury (glomerulonephritis)--decrease in the amount of urine, red or dark brown urine, foamy or bubbly urine, swelling of the ankles, hands, or feet Shortness of breath or trouble breathing Spleen injury--pain in upper left stomach or shoulder Unusual bruising or bleeding Side effects that usually do not require medical attention (report to your care team if they continue or are bothersome): Bone pain Pain in the hands or feet This list may not describe all possible side effects. Call your doctor for medical advice about side effects. You may report side effects to FDA at 1-800-FDA-1088. Where should I keep my medication? Keep out of the reach of children. If you are using this medication at home, you will be instructed on how to store it. Throw away any unused medication after the expiration  date on the label. NOTE: This sheet is a summary. It may not cover all possible information. If you have questions about this medicine, talk to your doctor, pharmacist, or health care provider.  2024 Elsevier/Gold Standard (2021-06-08 00:00:00)  Cyclophosphamide  Injection What is this medication? CYCLOPHOSPHAMIDE  (sye kloe FOSS fa mide) treats some types of cancer. It works by slowing down the growth of cancer cells. This medicine may be used for other purposes; ask your health care provider or pharmacist if you have questions. COMMON BRAND NAME(S): Cyclophosphamide , Cytoxan , Neosar  What should I tell my care team before I take this medication? They need to know if you have any of these conditions: Heart disease Irregular heartbeat or rhythm Infection Kidney problems Liver disease Low blood cell levels (white cells, platelets, or red blood cells) Lung disease Previous radiation Trouble passing urine An unusual or allergic reaction to cyclophosphamide , other medications, foods, dyes, or preservatives Pregnant or trying to get pregnant Breast-feeding How should I use this medication? This medication is injected into a vein. It is given by your care team in a hospital or clinic setting. Talk to your care team about the use of this medication in children. Special care may be needed. Overdosage: If you think you have taken too much of this medicine contact a  poison control center or emergency room at once. NOTE: This medicine is only for you. Do not share this medicine with others. What if I miss a dose? Keep appointments for follow-up doses. It is important not to miss your dose. Call your care team if you are unable to keep an appointment. What may interact with this medication? Amphotericin B Amiodarone Azathioprine Certain antivirals for HIV or hepatitis Certain medications for blood pressure, such as enalapril, lisinopril,  quinapril Cyclosporine Diuretics Etanercept Indomethacin Medications that relax muscles Metronidazole Natalizumab Tamoxifen Warfarin This list may not describe all possible interactions. Give your health care provider a list of all the medicines, herbs, non-prescription drugs, or dietary supplements you use. Also tell them if you smoke, drink alcohol, or use illegal drugs. Some items may interact with your medicine. What should I watch for while using this medication? This medication may make you feel generally unwell. This is not uncommon as chemotherapy can affect healthy cells as well as cancer cells. Report any side effects. Continue your course of treatment even though you feel ill unless your care team tells you to stop. You may need blood work while you are taking this medication. This medication may increase your risk of getting an infection. Call your care team for advice if you get a fever, chills, sore throat, or other symptoms of a cold or flu. Do not treat yourself. Try to avoid being around people who are sick. Avoid taking medications that contain aspirin, acetaminophen , ibuprofen , naproxen, or ketoprofen unless instructed by your care team. These medications may hide a fever. Be careful brushing or flossing your teeth or using a toothpick because you may get an infection or bleed more easily. If you have any dental work done, tell your dentist you are receiving this medication. Drink water  or other fluids as directed. Urinate often, even at night. Some products may contain alcohol. Ask your care team if this medication contains alcohol. Be sure to tell all care teams you are taking this medicine. Certain medicines, like metronidazole and disulfiram, can cause an unpleasant reaction when taken with alcohol. The reaction includes flushing, headache, nausea, vomiting, sweating, and increased thirst. The reaction can last from 30 minutes to several hours. Talk to your care team if you  wish to become pregnant or think you might be pregnant. This medication can cause serious birth defects if taken during pregnancy and for 1 year after the last dose. A negative pregnancy test is required before starting this medication. A reliable form of contraception is recommended while taking this medication and for 1 year after the last dose. Talk to your care team about reliable forms of contraception. Do not father a child while taking this medication and for 4 months after the last dose. Use a condom during this time period. Do not breast-feed while taking this medication or for 1 week after the last dose. This medication may cause infertility. Talk to your care team if you are concerned about your fertility. Talk to your care team about your risk of cancer. You may be more at risk for certain types of cancer if you take this medication. What side effects may I notice from receiving this medication? Side effects that you should report to your care team as soon as possible: Allergic reactions--skin rash, itching, hives, swelling of the face, lips, tongue, or throat Dry cough, shortness of breath or trouble breathing Heart failure--shortness of breath, swelling of the ankles, feet, or hands, sudden weight gain, unusual weakness  or fatigue Heart muscle inflammation--unusual weakness or fatigue, shortness of breath, chest pain, fast or irregular heartbeat, dizziness, swelling of the ankles, feet, or hands Heart rhythm changes--fast or irregular heartbeat, dizziness, feeling faint or lightheaded, chest pain, trouble breathing Infection--fever, chills, cough, sore throat, wounds that don't heal, pain or trouble when passing urine, general feeling of discomfort or being unwell Kidney injury--decrease in the amount of urine, swelling of the ankles, hands, or feet Liver injury--right upper belly pain, loss of appetite, nausea, light-colored stool, dark yellow or brown urine, yellowing skin or eyes,  unusual weakness or fatigue Low red blood cell level--unusual weakness or fatigue, dizziness, headache, trouble breathing Low sodium level--muscle weakness, fatigue, dizziness, headache, confusion Red or dark brown urine Unusual bruising or bleeding Side effects that usually do not require medical attention (report to your care team if they continue or are bothersome): Hair loss Irregular menstrual cycles or spotting Loss of appetite Nausea Pain, redness, or swelling with sores inside the mouth or throat Vomiting This list may not describe all possible side effects. Call your doctor for medical advice about side effects. You may report side effects to FDA at 1-800-FDA-1088. Where should I keep my medication? This medication is given in a hospital or clinic. It will not be stored at home. NOTE: This sheet is a summary. It may not cover all possible information. If you have questions about this medicine, talk to your doctor, pharmacist, or health care provider.  2024 Elsevier/Gold Standard (2021-11-23 00:00:00)  Docetaxel  Injection What is this medication? DOCETAXEL  (doe se TAX el) treats some types of cancer. It works by slowing down the growth of cancer cells. This medicine may be used for other purposes; ask your health care provider or pharmacist if you have questions. COMMON BRAND NAME(S): BEIZRAY , Docefrez , Docivyx , Taxotere  What should I tell my care team before I take this medication? They need to know if you have any of these conditions: Kidney disease Liver disease Low white blood cell levels Tingling of the fingers or toes or other nerve disorder An unusual or allergic reaction to docetaxel , polysorbate 80, other medications, foods, dyes, or preservatives Pregnant or trying to get pregnant Breast-feeding How should I use this medication? This medication is injected into a vein. It is given by your care team in a hospital or clinic setting. Talk to your care team about the  use of this medication in children. Special care may be needed. Overdosage: If you think you have taken too much of this medicine contact a poison control center or emergency room at once. NOTE: This medicine is only for you. Do not share this medicine with others. What if I miss a dose? Keep appointments for follow-up doses. It is important not to miss your dose. Call your care team if you are unable to keep an appointment. What may interact with this medication? Do not take this medication with any of the following: Live virus vaccines This medication may also interact with the following: Certain antibiotics, such as clarithromycin, telithromycin Certain antivirals for HIV or hepatitis Certain medications for fungal infections, such as itraconazole, ketoconazole, voriconazole Grapefruit juice Nefazodone Supplements, such as St. John's wort This list may not describe all possible interactions. Give your health care provider a list of all the medicines, herbs, non-prescription drugs, or dietary supplements you use. Also tell them if you smoke, drink alcohol, or use illegal drugs. Some items may interact with your medicine. What should I watch for while using this medication? This  medication may make you feel generally unwell. This is not uncommon as chemotherapy can affect healthy cells as well as cancer cells. Report any side effects. Continue your course of treatment even though you feel ill unless your care team tells you to stop. You may need blood work done while you are taking this medication. This medication can cause serious side effects and infusion reactions. To reduce the risk, your care team may give you other medications to take before receiving this one. Be sure to follow the directions from your care team. This medication may increase your risk of getting an infection. Call your care team for advice if you get a fever, chills, sore throat, or other symptoms of a cold or flu. Do not  treat yourself. Try to avoid being around people who are sick. Avoid taking medications that contain aspirin, acetaminophen , ibuprofen , naproxen, or ketoprofen unless instructed by your care team. These medications may hide a fever. Be careful brushing or flossing your teeth or using a toothpick because you may get an infection or bleed more easily. If you have any dental work done, tell your dentist you are receiving this medication. Some products may contain alcohol. Ask your care team if this medication contains alcohol. Be sure to tell all care teams you are taking this medicine. Certain medications, like metronidazole and disulfiram, can cause an unpleasant reaction when taken with alcohol. The reaction includes flushing, headache, nausea, vomiting, sweating, and increased thirst. The reaction can last from 30 minutes to several hours. This medication may affect your coordination, reaction time, or judgement. Do not drive or operate machinery until you know how this medication affects you. Sit up or stand slowly to reduce the risk of dizzy or fainting spells. Drinking alcohol with this medication can increase the risk of these side effects. Talk to your care team about your risk of cancer. You may be more at risk for certain types of cancer if you take this medication. Talk to your care team if you wish to become pregnant or think you might be pregnant. This medication can cause serious birth defects if taken during pregnancy or if you get pregnant within 2 months after stopping therapy. A negative pregnancy test is required before starting this medication. A reliable form of contraception is recommended while taking this medication and for 2 months after stopping it. Talk to your care team about reliable forms of contraception. Do not breast-feed while taking this medication and for 1 week after stopping therapy. Use a condom during sex and for 4 months after stopping therapy. Tell your care team right  away if you think your partner might be pregnant. This medication can cause serious birth defects. This medication may cause infertility. Talk to your care team if you are concerned about your fertility. What side effects may I notice from receiving this medication? Side effects that you should report to your care team as soon as possible: Allergic reactions--skin rash, itching, hives, swelling of the face, lips, tongue, or throat Change in vision such as blurry vision, seeing halos around lights, vision loss Infection--fever, chills, cough, or sore throat Infusion reactions--chest pain, shortness of breath or trouble breathing, feeling faint or lightheaded Low red blood cell level--unusual weakness or fatigue, dizziness, headache, trouble breathing Pain, tingling, or numbness in the hands or feet Painful swelling, warmth, or redness of the skin, blisters or sores at the infusion site Redness, blistering, peeling, or loosening of the skin, including inside the mouth Sudden or severe  stomach pain, bloody diarrhea, fever, nausea, vomiting Swelling of the ankles, hands, or feet Tumor lysis syndrome (TLS)--nausea, vomiting, diarrhea, decrease in the amount of urine, dark urine, unusual weakness or fatigue, confusion, muscle pain or cramps, fast or irregular heartbeat, joint pain Unusual bruising or bleeding Side effects that usually do not require medical attention (report to your care team if they continue or are bothersome): Change in nail shape, thickness, or color Change in taste Hair loss Increased tears This list may not describe all possible side effects. Call your doctor for medical advice about side effects. You may report side effects to FDA at 1-800-FDA-1088. Where should I keep my medication? This medication is given in a hospital or clinic. It will not be stored at home. NOTE: This sheet is a summary. It may not cover all possible information. If you have questions about this  medicine, talk to your doctor, pharmacist, or health care provider.  2024 Elsevier/Gold Standard (2021-09-13 00:00:00)   To help prevent nausea and vomiting after your treatment, we encourage you to take your nausea medication as directed.  BELOW ARE SYMPTOMS THAT SHOULD BE REPORTED IMMEDIATELY: *FEVER GREATER THAN 100.4 F (38 C) OR HIGHER *CHILLS OR SWEATING *NAUSEA AND VOMITING THAT IS NOT CONTROLLED WITH YOUR NAUSEA MEDICATION *UNUSUAL SHORTNESS OF BREATH *UNUSUAL BRUISING OR BLEEDING *URINARY PROBLEMS (pain or burning when urinating, or frequent urination) *BOWEL PROBLEMS (unusual diarrhea, constipation, pain near the anus) TENDERNESS IN MOUTH AND THROAT WITH OR WITHOUT PRESENCE OF ULCERS (sore throat, sores in mouth, or a toothache) UNUSUAL RASH, SWELLING OR PAIN  UNUSUAL VAGINAL DISCHARGE OR ITCHING   Items with * indicate a potential emergency and should be followed up as soon as possible or go to the Emergency Department if any problems should occur.  Please show the CHEMOTHERAPY ALERT CARD or IMMUNOTHERAPY ALERT CARD at check-in to the Emergency Department and triage nurse.  Should you have questions after your visit or need to cancel or reschedule your appointment, please contact [facility] [place] - A DEPT OF [facility] [phone]  and follow the prompts.  Office hours are 8:00 a.m. to 4:30 p.m. Monday - Friday. Please note that voicemails left after 4:00 p.m. may not be returned until the following business day.  We are closed weekends and major holidays. You have access to a nurse at all times for urgent questions. Please call the main number to the clinic [phone] and follow the prompts.  For any non-urgent questions, you may also contact your provider using MyChart. We now offer e-Visits for anyone 23 and older to request care online for non-urgent symptoms. For details visit [email or web address].   Also download the MyChart app! Go to the  app store, search MyChart, open the app, select [facility], and log in with your MyChart username and password.

## 2024-01-15 NOTE — Telephone Encounter (Signed)
 Copied from CRM [id number]. Topic: General - Call Back - No Documentation >> Jan 15, 2024 11:41 AM Silvana PARAS wrote: Reason for CRM: Pt's son [name] returning phone call of Loreli Miriam SAUNDERS, CMA. Called clinic and advised to send CRM. Pt's callback number is [phone].

## 2024-01-15 NOTE — Assessment & Plan Note (Signed)
 Currently near completion of chemotherapy, and is to stsrt radiation treatment Feels weak and not doing well with chemo but battling through this. Son, [name] is her caregiver

## 2024-01-15 NOTE — Assessment & Plan Note (Signed)
 Add daily miralax and dulcolax every 3 days if needed,colntinue daily colace

## 2024-01-15 NOTE — Progress Notes (Signed)
 [facility] [address], [place] [address]   Clinic Day:  01/15/2024  Referring physician: Antonetta Rollene BRAVO, MD  Patient Care Team: Antonetta Rollene BRAVO, MD as PCP - General Debera Jayson MATSU, MD as PCP - Cardiology (Cardiology) Harvey Margo CROME, MD (Inactive) as Consulting Physician (Gastroenterology) Rogers Hai, MD as Medical Oncologist (Medical Oncology) Celestia Joesph SQUIBB, RN as Oncology Nurse Navigator (Medical Oncology) Pappayliou, Dorothyann LABOR, DO as Consulting Physician (General Surgery)   ASSESSMENT & PLAN:   Assessment:  1.  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  0.5 cm right axillary lymph node which appears to have lost its fatty hilum and another right axillary lymph node with upper limits of normal cortical thickness 3.7 mm. - Right breast mass biopsy (09/09/2023): Invasive poorly differentiated ductal adenocarcinoma, grade 3, ER/PR negative, HER2 (0+), Ki 67: 30%.  Lymph node could not be biopsied as it was not visible on ultrasound. - PET scan (09/25/2023): Mild metabolic activity in the medial right breast with no evidence of metastatic disease. - Right breast lumpectomy and SLNB on 10/22/2023 - Pathology: 2.1 cm IDC, grade 3, with extensive necrosis, associated DCIS, intermediate grade, margins negative.  LVI negative.  0/7 sentinel lymph nodes involved. - Adjuvant chemotherapy with dose attenuated TC cycle 1 on 12/04/2023  2. Social/Family History: -Lives at home with her granddaughter. Retired from [employer]. No tobacco use. No ETOH consumption. No chemical exposures.  -2 Maternal aunts had breast cancer. Maternal uncle had prostate cancer. Maternal niece had breast cancer.   Plan:  1.  Stage IIa  (T2 N0 M0 G3 ER/PR/HER2-) TNBC of the right breast: - She received cycle 2 on 12/25/2023. - She felt severely fatigued.  She also reported dry mouth.  She has some vague abdominal pain on and off without any relation to food.  She is drinking about 80 ounces of water  per day. - Reviewed labs today: Creatinine 1.14.  LFTs are normal.  CBC grossly normal with hemoglobin 9.7.  Ferritin is 166 and saturation 27.  Likely from myelosuppression. - Recommend decreasing cycle 3 dose today to 66.7% due to severe fatigue.  RTC 3 weeks for follow-up.   2.  Peripheral neuropathy: - Tingling in the fingers of both hands from carpal tunnel syndrome is stable.  Denies any tingling in the lower extremities.  3.  Decreased appetite: - She reports severe decrease in appetite.  No significant weight loss.  Will start her on Megace 400 mg once daily, may increase to twice daily.   No orders of the defined types were placed in this encounter.     LILLETTE Verneta SAUNDERS Teague,acting as a Neurosurgeon for Hai Rogers, MD.,have documented all relevant documentation on the behalf of Hai Rogers, MD,as directed by  Hai Rogers, MD while in the presence of Hai Rogers, MD.  I, Hai Rogers MD, have reviewed the above documentation for accuracy and completeness, and I agree with the above.      Hai Rogers, MD   6/26/20252:54 PM  CHIEF COMPLAINT/PURPOSE OF CONSULT:   Diagnosis: Right breast triple negative breast cancer.   Cancer Staging  Breast cancer of lower-inner quadrant of right female breast [facility]) Staging form: Breast, AJCC 8th Edition - Pathologic stage from 11/19/2023: Stage IIA (  pT2, pN0(sn), cM0, G3, ER-, PR-, HER2-) - Signed by Rogers Hai, MD on 11/19/2023    Prior Therapy: Right lumpectomy and SLNB  Current Therapy: Adjuvant chemotherapy (TC)   HISTORY OF PRESENT ILLNESS:   Oncology History  Breast cancer of lower-inner quadrant of right female  breast (HCC)  09/16/2023 Initial Diagnosis   Breast cancer of lower-inner quadrant of right female breast (HCC)   10/09/2023 Genetic Testing   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  VUS in POLE at  p.T41M (c.122C>T).  Report date is 10/09/2023.   The CancerNext-Expanded gene panel offered by [facility] and includes sequencing, rearrangement, and RNA analysis for the following 76 genes: AIP, ALK, APC, ATM, AXIN2, BAP1, BARD1, BMPR1A, BRCA1, BRCA2, BRIP1, CDC73, CDH1, CDK4, CDKN1B, CDKN2A, CEBPA, CHEK2, CTNNA1, DDX41, DICER1, ETV6, FH, FLCN, GATA2, LZTR1, MAX, MBD4, MEN1, MET, MLH1, MSH2, MSH3, MSH6, MUTYH, NF1, NF2, NTHL1, PALB2, PHOX2B, PMS2, POT1, PRKAR1A, PTCH1, PTEN, RAD51C, RAD51D, RB1, RET, RUNX1, SDHA, SDHAF2, SDHB, SDHC, SDHD, SMAD4, SMARCA4, SMARCB1, SMARCE1, STK11, SUFU, TMEM127, TP53, TSC1, TSC2, VHL, and WT1 (sequencing and deletion/duplication); EGFR, HOXB13, KIT, MITF, PDGFRA, POLD1, and POLE (sequencing only); EPCAM and GREM1 (deletion/duplication only).    11/19/2023 Cancer Staging   Staging form: Breast, AJCC 8th Edition - Pathologic stage from 11/19/2023: Stage IIA (pT2, pN0(sn), cM0, G3, ER-, PR-, HER2-) - Signed by Rogers Hai, MD on 11/19/2023 Histopathologic type: Infiltrating duct carcinoma, NOS Stage prefix: Initial diagnosis Method of lymph node assessment: Sentinel lymph node biopsy Nuclear grade: G3 Multigene prognostic tests performed: None Histologic grading system: 3 grade system Residual tumor (R): R0   12/04/2023 -  Chemotherapy   Patient is on Treatment Plan : BREAST TC q21d         Shaletta is a 78 y.o. female presenting to clinic today for evaluation of triple negative right breast at the request of Antonetta Rollene BRAVO, MD.  Patient underwent bilateral breast MM on 07/07/23 that found: In the right breast, a mass warrants further evaluation. In the left breast, no findings suspicious for malignancy. Right breast diagnostic MM and  US  was done on 08/26/23 that showed: 1.8 cm suspicious mass within the lower inner RIGHT breast. Two slightly abnormal appearing RIGHT axillary lymph nodes.  Kendalyn then had a biopsy of the 4 o'clock mass in the right breast on 09/09/23 that revealed: Invasive poorly differentiated ductal adenocarcinoma with extensive necrosis, grade 3. The mass was ER-/PR-/Her2- (0+).  Today, she states that she is doing well overall. Her appetite level is at 100%. Her energy level is at 50%. She is accompanied by her brother and her son and daughter on the phone.   Lucky had menarche around age 60 and menopause in her early 57's. She was given some sort of medication for a year to help with hot flashes. She is unaware if she has taken hormone replacements. Arleny took birth control for 2-3 years. She has 2 children with her first childbirth at age 70.   Dimple denies any unintentional weight loss, new onset pains, or peripheral neuropathy. She has no prior history of breast biopsy. She has heart flutters, but denies any MI's, TIA's, or CVA's. Back pain is chronic.   Eilidh reports intermittent ankle swellings, she attributes to 2 total knee arthroplasties in 2005.   INTERVAL HISTORY:   EMBERLEY KRAL is a 78 y.o. female presenting to the clinic today for follow-up of triple negative right breast cancer. She was last seen by me on 12/25/23.  Today, she states that she is doing well overall. Her appetite level is at 0%. Her energy level is at 0%. Monalisa reports severe weakness and fatigue after her last treatment. These symptoms are still present today, though they have somewhat improved. She denies any nausea, vomiting, or diarrhea.   She notes generalized on and off abdominal pain during the day, present before eating, and states she may have constipation. Pain is not improved with eating. She denies any pain on palpation. Jadaya has not taken Maalox or Pepto bismol, though she is willing to try Maalox.   She states she  has decreased appetite and does not eat an appropriate amount. She reports cravings for pinto beans and red meat, though she does not eat a large amount of these when she gets the food.   She notes constant dry mouth and denies any dehydration, as she drinks 80 oz of water  a day.   PAST MEDICAL HISTORY:   Past Medical History: 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   Depression    Diverticulosis of colon    GERD (gastroesophageal reflux disease)    Hyperlipidemia    Hypertension    Obesity    Osteoarthritis of knees, bilateral    Sleep apnea     Surgical History: Past Surgical History:  Procedure Laterality Date   ABDOMINAL HYSTERECTOMY     AXILLARY SENTINEL NODE BIOPSY Right 10/22/2023   Procedure: BIOPSY, LYMPH NODE, SENTINEL, AXILLARY;  Surgeon: Evonnie Dorothyann LABOR, DO;  Location: [facility];  Service: General;  Laterality: Right;   BREAST BIOPSY Right 09/09/2023   US  RT BREAST BX W LOC DEV 1ST LESION IMG BX SPEC US  GUIDE 09/09/2023 Lennon Nest, MD [facility]-ULTRASOUND   BREAST LUMPECTOMY WITH RADIO FREQUENCY LOCALIZER Right 10/22/2023   Procedure: BREAST LUMPECTOMY WITH RADIO FREQUENCY LOCALIZER;  Surgeon: Evonnie Dorothyann LABOR, DO;  Location: [facility];  Service: General;  Laterality: Right;   COLONOSCOPY  07/23/2003   Dr. Keven Smith:numerous large scattered diverticula   COLONOSCOPY N/A 11/15/2013   Dr. Harvey: moderate diverticula, small internal hemorrhoids, redudant colon. Next TCS in 2025 with overtube.    ESOPHAGOGASTRODUODENOSCOPY (EGD) WITH ESOPHAGEAL DILATION N/A 11/15/2013   Dr. Harvey: stricture at GE junction s/p dilation. moderate erosive gastritis, negative H.pylori   MULTIPLE TOOTH EXTRACTIONS  2024   PORTACATH PLACEMENT Left 10/22/2023   Procedure: INSERTION, TUNNELED CENTRAL VENOUS DEVICE, WITH PORT;  Surgeon: Evonnie Dorothyann LABOR, DO;  Location: [facility];  Service: General;  Laterality: Left;    TOTAL KNEE ARTHROPLASTY  06/01/2004   left / Dr. Margrette   TOTAL KNEE ARTHROPLASTY  11/29/2003   right / Dr. Margrette   TUBAL LIGATION      Social History: Social History   Socioeconomic History   Marital status: Widowed    Spouse name: Sausha Raymond   Number of children: 2   Years of education: 12   Highest education level: 12th grade  Occupational History   Occupation: retired     Associate Professor: UNEMPLOYED  Tobacco Use   Smoking status: Never    Passive exposure: Never   Smokeless tobacco: Never  Vaping Use   Vaping status: Never Used  Substance and Sexual Activity   Alcohol use: No   Drug use: No   Sexual activity: Not Currently    Partners: Male    Birth control/protection: Surgical  Other Topics Concern   Not on file  Social History Narrative   Not on file   Social Drivers of Health   Financial Resource Strain: Low Risk  (08/12/2023)   Overall Financial Resource Strain (CARDIA)    Difficulty of Paying Living Expenses: Not hard at all  Food Insecurity: No Food Insecurity (08/12/2023)   Hunger Vital Sign    Worried About Running Out of Food in the Last Year: Never true    Ran Out of Food in the Last Year: Never true  Transportation Needs: No Transportation Needs (08/12/2023)   PRAPARE - Administrator, Civil Service (Medical): No    Lack of Transportation (Non-Medical): No  Physical Activity: Sufficiently Active (08/12/2023)   Exercise Vital Sign    Days of Exercise per Week: 5 days    Minutes of Exercise per Session: 50 min  Stress: No Stress Concern Present (08/12/2023)   [employer] of Occupational Health - Occupational Stress Questionnaire    Feeling of Stress : Only a little  Social Connections: Moderately Integrated (08/12/2023)   Social Connection and Isolation Panel    Frequency of Communication with Friends and Family: More than three times a week    Frequency of Social Gatherings with Friends and Family: More than three times a week     Attends Religious Services: More than 4 times per year    Active Member of [employer] or Organizations: Yes    Attends Banker Meetings: More than 4 times per year    Marital Status: Widowed  Intimate Partner Violence: Not At Risk (08/12/2023)   Humiliation, Afraid, Rape, and Kick questionnaire    Fear of Current or Ex-Partner: No    Emotionally Abused: No    Physically Abused: No    Sexually Abused: No    Family History: Family History  Problem Relation Age of Onset   Diabetes Mother    Hypertension Mother    Heart disease Mother    Aneurysm Father    Diabetes Father    Hypertension Brother    Diabetes Brother    Diabetes Brother    Hypertension Brother    Breast cancer Maternal Aunt        dx >50   Cancer Maternal Aunt        unknown type; ? breast; dx >50?   Prostate cancer Maternal Uncle    Hypertension Son     Current Medications:  Current Outpatient Medications:    albuterol  (VENTOLIN  HFA) 108 (90 Base) MCG/ACT inhaler, Inhale 2 puffs into the lungs every 6 (six) hours as needed for wheezing or shortness of breath., Disp: 8.5 each, Rfl: 1   amLODipine  (NORVASC ) 10 MG tablet, TAKE 1 TABLET BY MOUTH DAILY, Disp: 100 tablet, Rfl: 2   aspirin EC 81 MG tablet, Take 81 mg by mouth daily. Swallow whole., Disp: , Rfl:    atorvastatin  (LIPITOR) 20 MG tablet, Take 1 tablet (20 mg total) by mouth daily., Disp: 90 tablet, Rfl: 3   BIOTIN PO, Take 1 tablet by mouth daily., Disp: , Rfl:    Cholecalciferol (VITAMIN D3) 125 MCG (5000 UT) CAPS, Take 1 capsule (5,000 Units total) by mouth daily., Disp: 30 capsule, Rfl: 11   cloNIDine  (CATAPRES ) 0.2 MG tablet, Take 1 tablet (0.2 mg total) by mouth daily., Disp: 90 tablet, Rfl: 0   clotrimazole -betamethasone  (LOTRISONE ) cream, Apply cream twice daily to rash in groin for 10 days, then as needed, Disp: 45 g, Rfl: 1   cycloPHOSphamide  (CYTOXAN  IJ), Inject as directed., Disp: , Rfl:  dexamethasone  10 mg in sodium chloride  0.9 % 50  mL, Inject 10 mg into the vein., Disp: , Rfl:    DOCEtaxel  (TAXOTERE  IV), Inject into the vein., Disp: , Rfl:    docusate sodium  (COLACE) 100 MG capsule, Take 1 capsule (100 mg total) by mouth 2 (two) times daily., Disp: 60 capsule, Rfl: 2   famotidine (PEPCID) 10 MG tablet, Take 10 mg by mouth 2 (two) times daily., Disp: , Rfl:    furosemide  (LASIX ) 40 MG tablet, TAKE 1 TABLET BY MOUTH DAILY AS NEEDED FOR EDEMA OR FLUID., Disp: 30 tablet, Rfl: 2   gabapentin  (NEURONTIN ) 100 MG capsule, Take 1 capsule (100 mg total) by mouth at bedtime., Disp: 30 capsule, Rfl: 3   gabapentin  (NEURONTIN ) 300 MG capsule, TAKE 1 CAPSULE BY MOUTH AT  BEDTIME, Disp: 100 capsule, Rfl: 2   Hydrocortisone (GERHARDT'S BUTT CREAM) CREA, Apply 1 application topically 3 (three) times daily., Disp: 1 each, Rfl: 1   lidocaine -prilocaine  (EMLA ) cream, Apply to affected area once, Disp: 30 g, Rfl: 3   losartan -hydrochlorothiazide  (HYZAAR) 100-25 MG tablet, Take 1 tablet by mouth daily., Disp: 30 tablet, Rfl: 5   magnesium  30 MG tablet, Take one tablet by mouth once daily, Disp: 30 tablet, Rfl: 2   megestrol (MEGACE) 400 MG/10ML suspension, Take 10 mLs (400 mg total) by mouth 2 (two) times daily., Disp: 480 mL, Rfl: 0   Melatonin 3 MG CAPS, Take 1 capsule (3 mg total) by mouth at bedtime., Disp: 30 capsule, Rfl: 3   metoprolol  succinate (TOPROL -XL) 50 MG 24 hr tablet, TAKE 1 TABLET BY MOUTH  DAILY WITH OR IMMEDIATELY  FOLLOWING A MEAL, Disp: 100 tablet, Rfl: 2   olopatadine  (PATANOL) 0.1 % ophthalmic solution, Place 1 drop into both eyes 2 (two) times daily., Disp: 5 mL, Rfl: 6   omeprazole  (PRILOSEC) 20 MG capsule, Take 1 capsule (20 mg total) by mouth daily., Disp: 100 capsule, Rfl: 2   ondansetron  (ZOFRAN ) 4 MG tablet, Take 1 tablet (4 mg total) by mouth daily as needed for nausea or vomiting., Disp: 30 tablet, Rfl: 1   oxyCODONE  (ROXICODONE ) 5 MG immediate release tablet, Take 1 tablet (5 mg total) by mouth every 6 (six) hours  as needed., Disp: 8 tablet, Rfl: 0   Palonosetron  HCl (ALOXI  IV), Inject into the vein., Disp: , Rfl:    potassium chloride  SA (KLOR-CON  M) 20 MEQ tablet, Take 1 tablet (20 mEq total) by mouth daily., Disp: 100 tablet, Rfl: 2   prochlorperazine  (COMPAZINE ) 10 MG tablet, Take 1 tablet (10 mg total) by mouth every 6 (six) hours as needed for nausea or vomiting., Disp: 60 tablet, Rfl: 3   pyridOXINE  (VITAMIN B-6) 100 MG tablet, Take 200 mg by mouth daily., Disp: , Rfl:    sertraline  (ZOLOFT ) 50 MG tablet, Take 1 tablet (50 mg total) by mouth daily., Disp: 30 tablet, Rfl: 3   temazepam  (RESTORIL ) 15 MG capsule, Take 1 capsule (15 mg total) by mouth at bedtime as needed for sleep., Disp: 30 capsule, Rfl: 5 No current facility-administered medications for this visit.  Facility-Administered Medications Ordered in Other Visits:    0.9 %  sodium chloride  infusion, , Intravenous, Continuous, Rogers Hai, MD, Stopped at 01/15/24 1430   sodium chloride  flush (NS) 0.9 % injection 10 mL, 10 mL, Intracatheter, PRN, Dezyrae Kensinger, MD, 10 mL at 01/15/24 1430   Allergies: No Known Allergies  REVIEW OF SYSTEMS:   Review of Systems  Constitutional:  Positive for fatigue.  Negative for chills and fever.  HENT:   Negative for lump/mass, mouth sores, nosebleeds, sore throat and trouble swallowing.        +dry mouth  Eyes:  Positive for eye problems.  Respiratory:  Positive for shortness of breath. Negative for cough.   Cardiovascular:  Negative for chest pain, leg swelling and palpitations.  Gastrointestinal:  Positive for abdominal pain (occasional) and constipation. Negative for diarrhea, nausea and vomiting.  Genitourinary:  Negative for bladder incontinence, difficulty urinating, dysuria, frequency, hematuria and nocturia.   Musculoskeletal:  Negative for arthralgias, back pain, flank pain, myalgias and neck pain.  Skin:  Negative for itching and rash.  Neurological:  Negative for dizziness,  headaches and numbness.  Hematological:  Does not bruise/bleed easily.  Psychiatric/Behavioral:  Positive for sleep disturbance. Negative for depression and suicidal ideas. The patient is not nervous/anxious.   All other systems reviewed and are negative.    VITALS:   There were no vitals taken for this visit.  Wt Readings from Last 3 Encounters:  01/15/24 178 lb 12.7 oz (81.1 kg)  12/25/23 185 lb 6.4 oz (84.1 kg)  12/04/23 187 lb 1.6 oz (84.9 kg)    There is no height or weight on file to calculate BMI.  Performance status (ECOG): 1 - Symptomatic but completely ambulatory  PHYSICAL EXAM:   Physical Exam Vitals and nursing note reviewed. Exam conducted with a chaperone present.  Constitutional:      Appearance: Normal appearance.   Cardiovascular:     Rate and Rhythm: Normal rate and regular rhythm.     Pulses: Normal pulses.     Heart sounds: Normal heart sounds.  Pulmonary:     Effort: Pulmonary effort is normal.     Breath sounds: Normal breath sounds.  Abdominal:     Palpations: Abdomen is soft. There is no hepatomegaly, splenomegaly or mass.     Tenderness: There is no abdominal tenderness.   Musculoskeletal:     Right lower leg: No edema.     Left lower leg: No edema.  Lymphadenopathy:     Cervical: No cervical adenopathy.     Right cervical: No superficial, deep or posterior cervical adenopathy.    Left cervical: No superficial, deep or posterior cervical adenopathy.     Upper Body:     Right upper body: No supraclavicular or axillary adenopathy.     Left upper body: No supraclavicular or axillary adenopathy.   Neurological:     General: No focal deficit present.     Mental Status: She is alert and oriented to person, place, and time.   Psychiatric:        Mood and Affect: Mood normal.        Behavior: Behavior normal.   Breast Exam Chaperone: Joesph Bohr, RN   LABS:   CBC    Component Value Date/Time   WBC 9.9 01/15/2024 0958   RBC 3.39 (L)  01/15/2024 0958   HGB 9.7 (L) 01/15/2024 0958   HGB 12.0 01/31/2023 1145   HCT 30.3 (L) 01/15/2024 0958   HCT 37.0 01/31/2023 1145   PLT 297 01/15/2024 0958   PLT 204 01/31/2023 1145   MCV 89.4 01/15/2024 0958   MCV 92 01/31/2023 1145   MCH 28.6 01/15/2024 0958   MCHC 32.0 01/15/2024 0958   RDW 16.6 (H) 01/15/2024 0958   RDW 12.6 01/31/2023 1145   LYMPHSABS 2.1 01/15/2024 0958   LYMPHSABS 5.4 (H) 12/12/2022 1033   MONOABS 0.8 01/15/2024 9041  EOSABS 0.1 01/15/2024 0958   EOSABS 0.2 12/12/2022 1033   BASOSABS 0.1 01/15/2024 0958   BASOSABS 0.1 12/12/2022 1033    CMP    Component Value Date/Time   NA 136 01/15/2024 0958   NA 143 06/27/2023 1155   K 4.1 01/15/2024 0958   CL 100 01/15/2024 0958   CO2 23 01/15/2024 0958   GLUCOSE 138 (H) 01/15/2024 0958   BUN 18 01/15/2024 0958   BUN 17 06/27/2023 1155   CREATININE 1.14 (H) 01/15/2024 0958   CREATININE 0.78 02/15/2020 0848   CALCIUM  9.6 01/15/2024 0958   PROT 6.5 01/15/2024 0958   PROT 7.6 06/27/2023 1155   ALBUMIN 3.2 (L) 01/15/2024 0958   ALBUMIN 4.1 06/27/2023 1155   AST 23 01/15/2024 0958   ALT 13 01/15/2024 0958   ALKPHOS 63 01/15/2024 0958   BILITOT 0.5 01/15/2024 0958   BILITOT 0.6 06/27/2023 1155   GFRNONAA 50 (L) 01/15/2024 0958   GFRNONAA 76 02/15/2020 0848   GFRAA 89 08/15/2020 0931   GFRAA 88 02/15/2020 0848     No results found for: CEA1, CEA / No results found for: CEA1, CEA No results found for: PSA1 No results found for: CAN199 No results found for: CAN125  No results found for: TOTALPROTELP, ALBUMINELP, A1GS, A2GS, BETS, BETA2SER, GAMS, MSPIKE, SPEI Lab Results  Component Value Date   TIBC 287 12/25/2023   FERRITIN 166 12/25/2023   IRONPCTSAT 27 12/25/2023   No results found for: LDH   STUDIES:   No results found.

## 2024-01-15 NOTE — Patient Instructions (Signed)

## 2024-01-16 ENCOUNTER — Other Ambulatory Visit: Payer: Self-pay

## 2024-01-20 DIAGNOSIS — Z9221 Personal history of antineoplastic chemotherapy: Secondary | ICD-10-CM

## 2024-01-20 HISTORY — DX: Personal history of antineoplastic chemotherapy: Z92.21

## 2024-01-22 ENCOUNTER — Telehealth: Payer: Self-pay

## 2024-01-22 NOTE — Telephone Encounter (Signed)
 Copied from CRM [id number]. Topic: General - Other >> Jan 22, 2024 11:40 AM Sophia H wrote: Reason for CRM: Patients son Mr. Ventola is calling in on behalf of the patient, he had some concerns regarding the patients recent glucose level from lab work and he is wondering if there was still blood left to check the patients A1C. Please advise # [phone]

## 2024-01-25 ENCOUNTER — Inpatient Hospital Stay
Admission: EM | Admit: 2024-01-25 | Discharge: 2024-02-03 | DRG: 872 | Disposition: A | Discharge status: Home Health | Attending: Family Medicine | Admitting: Family Medicine

## 2024-01-25 ENCOUNTER — Other Ambulatory Visit: Payer: Self-pay

## 2024-01-25 ENCOUNTER — Emergency Department

## 2024-01-25 DIAGNOSIS — Z90710 Acquired absence of both cervix and uterus: Secondary | ICD-10-CM

## 2024-01-25 DIAGNOSIS — Z853 Personal history of malignant neoplasm of breast: Secondary | ICD-10-CM

## 2024-01-25 DIAGNOSIS — R000 Tachycardia, unspecified: Secondary | ICD-10-CM

## 2024-01-25 DIAGNOSIS — R600 Localized edema: Secondary | ICD-10-CM

## 2024-01-25 DIAGNOSIS — K5792 Diverticulitis of intestine, part unspecified, without perforation or abscess without bleeding: Secondary | ICD-10-CM | POA: Diagnosis not present

## 2024-01-25 DIAGNOSIS — N179 Acute kidney failure, unspecified: Secondary | ICD-10-CM | POA: Diagnosis not present

## 2024-01-25 DIAGNOSIS — Z1501 Genetic susceptibility to malignant neoplasm of breast: Secondary | ICD-10-CM

## 2024-01-25 DIAGNOSIS — A419 Sepsis, unspecified organism: Principal | ICD-10-CM | POA: Diagnosis present

## 2024-01-25 DIAGNOSIS — K219 Gastro-esophageal reflux disease without esophagitis: Secondary | ICD-10-CM | POA: Diagnosis not present

## 2024-01-25 DIAGNOSIS — Z79899 Other long term (current) drug therapy: Secondary | ICD-10-CM | POA: Diagnosis not present

## 2024-01-25 DIAGNOSIS — J811 Chronic pulmonary edema: Secondary | ICD-10-CM | POA: Diagnosis not present

## 2024-01-25 DIAGNOSIS — K5730 Diverticulosis of large intestine without perforation or abscess without bleeding: Secondary | ICD-10-CM | POA: Diagnosis not present

## 2024-01-25 DIAGNOSIS — E785 Hyperlipidemia, unspecified: Secondary | ICD-10-CM | POA: Diagnosis not present

## 2024-01-25 DIAGNOSIS — E669 Obesity, unspecified: Secondary | ICD-10-CM | POA: Diagnosis present

## 2024-01-25 DIAGNOSIS — I10 Essential (primary) hypertension: Secondary | ICD-10-CM | POA: Diagnosis not present

## 2024-01-25 DIAGNOSIS — Z8249 Family history of ischemic heart disease and other diseases of the circulatory system: Secondary | ICD-10-CM

## 2024-01-25 DIAGNOSIS — Z833 Family history of diabetes mellitus: Secondary | ICD-10-CM

## 2024-01-25 DIAGNOSIS — C50911 Malignant neoplasm of unspecified site of right female breast: Secondary | ICD-10-CM | POA: Diagnosis not present

## 2024-01-25 DIAGNOSIS — Z803 Family history of malignant neoplasm of breast: Secondary | ICD-10-CM | POA: Diagnosis not present

## 2024-01-25 DIAGNOSIS — R109 Unspecified abdominal pain: Secondary | ICD-10-CM | POA: Diagnosis not present

## 2024-01-25 DIAGNOSIS — E872 Acidosis, unspecified: Secondary | ICD-10-CM | POA: Diagnosis not present

## 2024-01-25 DIAGNOSIS — Z9221 Personal history of antineoplastic chemotherapy: Secondary | ICD-10-CM

## 2024-01-25 DIAGNOSIS — J45909 Unspecified asthma, uncomplicated: Secondary | ICD-10-CM | POA: Diagnosis present

## 2024-01-25 DIAGNOSIS — R140 Abdominal distension (gaseous): Secondary | ICD-10-CM | POA: Diagnosis not present

## 2024-01-25 DIAGNOSIS — Z96653 Presence of artificial knee joint, bilateral: Secondary | ICD-10-CM | POA: Diagnosis not present

## 2024-01-25 DIAGNOSIS — Z7982 Long term (current) use of aspirin: Secondary | ICD-10-CM | POA: Diagnosis not present

## 2024-01-25 DIAGNOSIS — Z9851 Tubal ligation status: Secondary | ICD-10-CM

## 2024-01-25 DIAGNOSIS — D72829 Elevated white blood cell count, unspecified: Secondary | ICD-10-CM | POA: Diagnosis not present

## 2024-01-25 DIAGNOSIS — I959 Hypotension, unspecified: Secondary | ICD-10-CM | POA: Diagnosis not present

## 2024-01-25 DIAGNOSIS — K5732 Diverticulitis of large intestine without perforation or abscess without bleeding: Principal | ICD-10-CM | POA: Diagnosis present

## 2024-01-25 DIAGNOSIS — Z6829 Body mass index (BMI) 29.0-29.9, adult: Secondary | ICD-10-CM

## 2024-01-25 DIAGNOSIS — R0600 Dyspnea, unspecified: Secondary | ICD-10-CM | POA: Diagnosis not present

## 2024-01-25 DIAGNOSIS — K449 Diaphragmatic hernia without obstruction or gangrene: Secondary | ICD-10-CM | POA: Diagnosis not present

## 2024-01-25 LAB — COMPREHENSIVE METABOLIC PANEL WITH GFR
ALT: 48 U/L — ABNORMAL HIGH (ref 0–44)
AST: 65 U/L — ABNORMAL HIGH (ref 15–41)
Albumin: 3.1 g/dL — ABNORMAL LOW (ref 3.5–5.0)
Alkaline Phosphatase: 160 U/L — ABNORMAL HIGH (ref 38–126)
Anion gap: 13 (ref 5–15)
BUN: 19 mg/dL (ref 8–23)
CO2: 24 mmol/L (ref 22–32)
Calcium: 9.7 mg/dL (ref 8.9–10.3)
Chloride: 100 mmol/L (ref 98–111)
Creatinine, Ser: 1.36 mg/dL — ABNORMAL HIGH (ref 0.44–1.00)
GFR, Estimated: 41 mL/min — ABNORMAL LOW (ref >60)
Glucose, Bld: 132 mg/dL — ABNORMAL HIGH (ref 70–99)
Potassium: 4.7 mmol/L (ref 3.5–5.1)
Sodium: 137 mmol/L (ref 135–145)
Total Bilirubin: 0.5 mg/dL (ref 0.0–1.2)
Total Protein: 7.7 g/dL (ref 6.5–8.1)

## 2024-01-25 LAB — CBC
HCT: 27 % — ABNORMAL LOW (ref 36.0–46.0)
Hemoglobin: 8.8 g/dL — ABNORMAL LOW (ref 12.0–15.0)
MCH: 29.5 pg (ref 26.0–34.0)
MCHC: 32.6 g/dL (ref 30.0–36.0)
MCV: 90.6 fL (ref 80.0–100.0)
Platelets: 268 K/uL (ref 150–400)
RBC: 2.98 MIL/uL — ABNORMAL LOW (ref 3.87–5.11)
RDW: 17.2 % — ABNORMAL HIGH (ref 11.5–15.5)
WBC: 15.3 K/uL — ABNORMAL HIGH (ref 4.0–10.5)
nRBC: 0 % (ref 0.0–0.2)

## 2024-01-25 LAB — URINALYSIS, ROUTINE W REFLEX MICROSCOPIC
Bilirubin Urine: NEGATIVE
Glucose, UA: NEGATIVE mg/dL
Hgb urine dipstick: NEGATIVE
Ketones, ur: NEGATIVE mg/dL
Leukocytes,Ua: NEGATIVE
Nitrite: NEGATIVE
Protein, ur: NEGATIVE mg/dL
Specific Gravity, Urine: 1.015 (ref 1.005–1.030)
pH: 6 (ref 5.0–8.0)

## 2024-01-25 LAB — LIPASE, BLOOD: Lipase: 29 U/L (ref 11–51)

## 2024-01-25 LAB — TROPONIN I (HIGH SENSITIVITY)
Troponin I (High Sensitivity): 10 ng/L (ref <18)
Troponin I (High Sensitivity): 8 ng/L (ref <18)

## 2024-01-25 LAB — LACTIC ACID, PLASMA
Lactic Acid, Venous: 2.5 mmol/L (ref 0.5–1.9)
Lactic Acid, Venous: 1.8 mmol/L (ref 0.5–1.9)

## 2024-01-25 MED ORDER — HYDRALAZINE HCL 20 MG/ML IJ SOLN
10.0000 mg | Freq: Four times a day (QID) | INTRAMUSCULAR | Status: DC | PRN
  Administered 2024-01-26: 10 mg via INTRAVENOUS
  Filled 2024-01-25: qty 1

## 2024-01-25 MED ORDER — DEXTROSE-SODIUM CHLORIDE 5-0.45 % IV SOLN: INTRAVENOUS | Status: AC

## 2024-01-25 MED ORDER — CLONIDINE HCL 0.1 MG PO TABS: 0.2000 mg | ORAL_TABLET | Freq: Every day | ORAL | Status: DC

## 2024-01-25 MED ORDER — IOHEXOL 300 MG/ML  SOLN
100.0000 mL | Freq: Once | INTRAMUSCULAR | Status: AC | PRN
  Administered 2024-01-25: 100 mL via INTRAVENOUS

## 2024-01-25 MED ORDER — ONDANSETRON HCL 4 MG/2ML IJ SOLN
4.0000 mg | Freq: Four times a day (QID) | INTRAMUSCULAR | Status: DC | PRN
  Administered 2024-01-27 – 2024-01-30 (×2): 4 mg via INTRAVENOUS
  Filled 2024-01-25 (×3): qty 2

## 2024-01-25 MED ORDER — METOPROLOL SUCCINATE ER 50 MG PO TB24
50.0000 mg | ORAL_TABLET | Freq: Every day | ORAL | Status: DC
  Administered 2024-01-27 – 2024-02-03 (×8): 50 mg via ORAL
  Filled 2024-01-25 (×8): qty 1

## 2024-01-25 MED ORDER — HYDROMORPHONE HCL 1 MG/ML IJ SOLN
0.5000 mg | INTRAMUSCULAR | Status: DC | PRN
  Administered 2024-01-26: 1 mg via INTRAVENOUS
  Filled 2024-01-25: qty 1

## 2024-01-25 MED ORDER — GABAPENTIN 100 MG PO CAPS
100.0000 mg | ORAL_CAPSULE | Freq: Every day | ORAL | Status: DC
  Administered 2024-01-25 – 2024-02-02 (×9): 100 mg via ORAL
  Filled 2024-01-25 (×9): qty 1

## 2024-01-25 MED ORDER — PIPERACILLIN-TAZOBACTAM 3.375 G IVPB 30 MIN
3.3750 g | Freq: Once | INTRAVENOUS | Status: AC
  Administered 2024-01-25: 3.375 g via INTRAVENOUS
  Filled 2024-01-25: qty 50

## 2024-01-25 MED ORDER — ONDANSETRON HCL 4 MG PO TABS
4.0000 mg | ORAL_TABLET | Freq: Four times a day (QID) | ORAL | Status: DC | PRN
  Administered 2024-02-01: 4 mg via ORAL
  Filled 2024-01-25: qty 1

## 2024-01-25 MED ORDER — ACETAMINOPHEN 325 MG PO TABS
650.0000 mg | ORAL_TABLET | Freq: Four times a day (QID) | ORAL | Status: DC | PRN
Start: 2024-01-25 — End: 2024-02-03
  Filled 2024-01-25: qty 2

## 2024-01-25 MED ORDER — ACETAMINOPHEN 650 MG RE SUPP: 650.0000 mg | Freq: Four times a day (QID) | RECTAL | Status: DC | PRN

## 2024-01-25 MED ORDER — SODIUM CHLORIDE 0.9 % IV BOLUS
1000.0000 mL | Freq: Once | INTRAVENOUS | Status: AC
  Administered 2024-01-25: 1000 mL via INTRAVENOUS

## 2024-01-25 MED ORDER — PIPERACILLIN-TAZOBACTAM 3.375 G IVPB 30 MIN
3.3750 g | Freq: Three times a day (TID) | INTRAVENOUS | Status: DC
  Administered 2024-01-26 – 2024-01-29 (×10): 3.375 g via INTRAVENOUS
  Filled 2024-01-25 (×23): qty 50

## 2024-01-25 MED ORDER — AMLODIPINE BESYLATE 5 MG PO TABS
10.0000 mg | ORAL_TABLET | Freq: Every day | ORAL | Status: DC
Start: 2024-01-26 — End: 2024-01-26

## 2024-01-25 MED ORDER — POLYETHYLENE GLYCOL 3350 17 G PO PACK: 17.0000 g | PACK | Freq: Every day | ORAL | Status: DC | PRN

## 2024-01-25 MED ORDER — ALBUTEROL SULFATE (2.5 MG/3ML) 0.083% IN NEBU: 2.5000 mg | INHALATION_SOLUTION | Freq: Four times a day (QID) | RESPIRATORY_TRACT | Status: DC | PRN

## 2024-01-25 MED ORDER — ENOXAPARIN SODIUM 40 MG/0.4ML IJ SOSY
40.0000 mg | PREFILLED_SYRINGE | INTRAMUSCULAR | Status: DC
  Administered 2024-01-25 – 2024-02-03 (×10): 40 mg via SUBCUTANEOUS
  Filled 2024-01-25 (×11): qty 0.4

## 2024-01-25 NOTE — ED Triage Notes (Signed)
 Pt comes with weakness and belly pain. Pt is cancer pt. Pt state last chemo recently on June 26 the patient states dec intake.

## 2024-01-25 NOTE — H&P (Signed)
 History and Physical    [name] FMW:[id number] DOB: 1946-03-20 DOA: 01/25/2024  DOS: the patient was seen and examined on 01/25/2024  PCP: Antonetta Rollene BRAVO, MD   Patient coming from: Home  I have personally briefly reviewed patient's old medical records in [facility]  Chief Complaint: Abdominal pain and diarrhea  HPI: [name] is a pleasant 78 y.o. female with medical history significant for breast cancer diagnosed in April 2025 s/p chemo third round on 01/15/2024, HTN, HLD, sleep apnea, diverticulosis of colon, chronic back pain who was brought in to the emergency room with abdominal pain, loose stool and generalized weakness.  Patient is stated that she has been having the symptoms for the last 5 days.  She denies any hematemesis or melena.  Denies any fever or chills.  She has a decreased appetite and some nausea but no vomiting.  This morning patient's daughter-in-law noticed in her loose stool that there was mucus but no blood and they were concerned and they brought her to the hospital.  ED Course: Upon arrival to the ED, patient is found to have a hypotension at 89/37, tachycardic around 91, afebrile, exam showed no signs of peritonitis, mild AKI with creatinine of 1.36, last one was 1.14 on 01/15/2024, lactic acid 2.5, leukocytosis at 15.3 and CT of the abdomen showing sigmoid diverticulitis with out perforation.  Hospitalist service was consulted for evaluation for admission for acute diverticulitis.  Review of Systems:  ROS  All other systems negative except as noted in the HPI.  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   Depression    Diverticulosis of colon    GERD (gastroesophageal reflux disease)    Hyperlipidemia    Hypertension    Obesity    Osteoarthritis of knees, bilateral    Sleep apnea     Past Surgical History:  Procedure Laterality Date   ABDOMINAL HYSTERECTOMY     AXILLARY  SENTINEL NODE BIOPSY Right 10/22/2023   Procedure: BIOPSY, LYMPH NODE, SENTINEL, AXILLARY;  Surgeon: Evonnie Dorothyann LABOR, DO;  Location: [facility];  Service: General;  Laterality: Right;   BREAST BIOPSY Right 09/09/2023   US  RT BREAST BX W LOC DEV 1ST LESION IMG BX SPEC US  GUIDE 09/09/2023 Lennon Nest, MD [facility]   BREAST LUMPECTOMY WITH RADIO FREQUENCY LOCALIZER Right 10/22/2023   Procedure: BREAST LUMPECTOMY WITH RADIO FREQUENCY LOCALIZER;  Surgeon: Evonnie Dorothyann LABOR, DO;  Location: [facility];  Service: General;  Laterality: Right;   COLONOSCOPY  07/23/2003   Dr. Keven Smith:numerous large scattered diverticula   COLONOSCOPY N/A 11/15/2013   Dr. Harvey: moderate diverticula, small internal hemorrhoids, redudant colon. Next TCS in 2025 with overtube.    ESOPHAGOGASTRODUODENOSCOPY (EGD) WITH ESOPHAGEAL DILATION N/A 11/15/2013   Dr. Harvey: stricture at GE junction s/p dilation. moderate erosive gastritis, negative H.pylori   MULTIPLE TOOTH EXTRACTIONS  2024   PORTACATH PLACEMENT Left 10/22/2023   Procedure: INSERTION, TUNNELED CENTRAL VENOUS DEVICE, WITH PORT;  Surgeon: Evonnie Dorothyann LABOR, DO;  Location: [facility];  Service: General;  Laterality: Left;   TOTAL KNEE ARTHROPLASTY  06/01/2004   left / Dr. Margrette   TOTAL KNEE ARTHROPLASTY  11/29/2003   right / Dr. Margrette   TUBAL LIGATION       reports that she has never smoked. She has never been exposed to tobacco smoke. She has never used smokeless tobacco. She reports that she does  not drink alcohol and does not use drugs.  No Known Allergies  Family History  Problem Relation Age of Onset   Diabetes Mother    Hypertension Mother    Heart disease Mother    Aneurysm Father    Diabetes Father    Hypertension Brother    Diabetes Brother    Diabetes Brother    Hypertension Brother    Breast cancer Maternal Aunt        dx >50   Cancer Maternal Aunt        unknown type; ? breast; dx >50?   Prostate cancer Maternal Uncle     Hypertension Son     Prior to Admission medications   Medication Sig Start Date End Date Taking? Authorizing Provider  albuterol  (VENTOLIN  HFA) 108 (90 Base) MCG/ACT inhaler Inhale 2 puffs into the lungs every 6 (six) hours as needed for wheezing or shortness of breath. 10/14/23   Antonetta Rollene BRAVO, MD  amLODipine  (NORVASC ) 10 MG tablet TAKE 1 TABLET BY MOUTH DAILY 11/04/23   Antonetta Rollene BRAVO, MD  aspirin EC 81 MG tablet Take 81 mg by mouth daily. Swallow whole.    [provider]  atorvastatin  (LIPITOR) 20 MG tablet Take 1 tablet (20 mg total) by mouth daily. 05/29/23   Antonetta Rollene BRAVO, MD  BIOTIN PO Take 1 tablet by mouth daily.    [provider]  bisacodyl  5 MG EC tablet Take one tablet by mouth every 3 days as needed, for constipation. Repeat once after 6 hours if still no bowel movement 01/15/24   Antonetta Rollene BRAVO, MD  Cholecalciferol (VITAMIN D3) 125 MCG (5000 UT) CAPS Take 1 capsule (5,000 Units total) by mouth daily. 05/29/23   Antonetta Rollene BRAVO, MD  cloNIDine  (CATAPRES ) 0.2 MG tablet Take 1 tablet (0.2 mg total) by mouth daily. 01/15/24   Antonetta Rollene BRAVO, MD  clotrimazole -betamethasone  (LOTRISONE ) cream Apply cream twice daily to rash in groin for 10 days, then as needed 05/29/23   Antonetta Rollene BRAVO, MD  cycloPHOSphamide  (CYTOXAN  IJ) Inject as directed.    [provider]  dexamethasone  10 mg in sodium chloride  0.9 % 50 mL Inject 10 mg into the vein.    [provider]  DOCEtaxel  (TAXOTERE  IV) Inject into the vein.    [provider]  docusate sodium  (COLACE) 100 MG capsule Take 1 capsule (100 mg total) by mouth 2 (two) times daily. 10/22/23 10/21/24  Pappayliou, Dorothyann A, DO  famotidine (PEPCID) 10 MG tablet Take 10 mg by mouth 2 (two) times daily.    [provider]  furosemide  (LASIX ) 40 MG tablet Take one tablet by mouth every day as needed,  for leg swelling 01/15/24   Antonetta Rollene BRAVO, MD  gabapentin   (NEURONTIN ) 100 MG capsule Take 1 capsule (100 mg total) by mouth at bedtime. 07/01/23   Antonetta Rollene BRAVO, MD  gabapentin  (NEURONTIN ) 300 MG capsule TAKE 1 CAPSULE BY MOUTH AT  BEDTIME 09/08/23   Antonetta Rollene BRAVO, MD  Hydrocortisone (GERHARDT'S BUTT CREAM) CREA Apply 1 application topically 3 (three) times daily. 03/30/19   Moishe Chiquita HERO, NP  lidocaine -prilocaine  (EMLA ) cream Apply to affected area once 11/24/23   Rogers Hai, MD  losartan -hydrochlorothiazide  (HYZAAR) 100-25 MG tablet Take 1 tablet by mouth daily. 05/29/23   Antonetta Rollene BRAVO, MD  magnesium  30 MG tablet Take one tablet by mouth once daily 05/29/23   Antonetta Rollene BRAVO, MD  megestrol  (MEGACE ) 400 MG/10ML suspension Take 10 mLs (400  mg total) by mouth 2 (two) times daily. 01/15/24   Rogers Hai, MD  Melatonin 3 MG CAPS Take 1 capsule (3 mg total) by mouth at bedtime. 05/29/23   Antonetta Rollene BRAVO, MD  metoprolol  succinate (TOPROL -XL) 50 MG 24 hr tablet TAKE 1 TABLET BY MOUTH  DAILY WITH OR IMMEDIATELY  FOLLOWING A MEAL 05/29/23   Antonetta Rollene BRAVO, MD  olopatadine  (PATANOL) 0.1 % ophthalmic solution Place 1 drop into both eyes 2 (two) times daily. 05/29/23   Antonetta Rollene BRAVO, MD  omeprazole  (PRILOSEC) 20 MG capsule Take 1 capsule (20 mg total) by mouth daily. 05/29/23   Antonetta Rollene BRAVO, MD  ondansetron  (ZOFRAN ) 4 MG tablet Take 1 tablet (4 mg total) by mouth daily as needed for nausea or vomiting. 10/22/23 10/21/24  Pappayliou, Dorothyann A, DO  oxyCODONE  (ROXICODONE ) 5 MG immediate release tablet Take 1 tablet (5 mg total) by mouth every 6 (six) hours as needed. 10/22/23   Pappayliou, Dorothyann A, DO  Palonosetron  HCl (ALOXI  IV) Inject into the vein.    [provider]  polyethylene glycol powder (GLYCOLAX /MIRALAX ) 17 GM/SCOOP powder Take 17 g by mouth 2 (two) times daily as needed. 01/15/24   Antonetta Rollene BRAVO, MD  potassium chloride  SA (KLOR-CON  M) 20 MEQ tablet Take 1 tablet (20 mEq total) by mouth  daily. 05/29/23   Antonetta Rollene BRAVO, MD  prochlorperazine  (COMPAZINE ) 10 MG tablet Take 1 tablet (10 mg total) by mouth every 6 (six) hours as needed for nausea or vomiting. 11/24/23   Rogers Hai, MD  pyridOXINE  (VITAMIN B-6) 100 MG tablet Take 200 mg by mouth daily.    [provider]  sertraline  (ZOLOFT ) 50 MG tablet Take 1 tablet (50 mg total) by mouth daily. 08/20/23   Antonetta Rollene BRAVO, MD  temazepam  (RESTORIL ) 15 MG capsule Take 1 capsule (15 mg total) by mouth at bedtime as needed for sleep. 10/14/23   Antonetta Rollene BRAVO, MD    Physical Exam: Vitals:   01/25/24 1520 01/25/24 1600 01/25/24 1645 01/25/24 1830  BP:  (!) 101/54 126/64 (!) 147/67  Pulse:  71 80 73  Resp:   17 17  Temp:      SpO2:  99% 100% 100%  Weight: 68 kg     Height: 5' (1.524 m)       Physical Exam   Constitutional: Alert, awake, calm, comfortable HEENT: Neck supple Respiratory: Clear to auscultation B/L, no wheezing, no rales.  Cardiovascular: Regular rate and rhythm, no murmurs / rubs / gallops. No extremity edema. 2+ pedal pulses. No carotid bruits.  Abdomen: Soft, mild tenderness, Bowel sounds positive.  Musculoskeletal: no clubbing / cyanosis. Good ROM, no contractures. Normal muscle tone.  Skin: no rashes, lesions, ulcers. Neurologic: CN 2-12 grossly intact. Sensation intact, No focal deficit identified Psychiatric: Alert and oriented x 3. Normal mood.    Labs on Admission: I have personally reviewed following labs and imaging studies  CBC: Recent Labs  Lab 01/25/24 1518  WBC 15.3*  HGB 8.8*  HCT 27.0*  MCV 90.6  PLT 268   Basic Metabolic Panel: Recent Labs  Lab 01/25/24 1518  NA 137  K 4.7  CL 100  CO2 24  GLUCOSE 132*  BUN 19  CREATININE 1.36*  CALCIUM  9.7   GFR: Estimated Creatinine Clearance: 30.8 mL/min (A) (by C-G formula based on SCr of 1.36 mg/dL (H)). Liver Function Tests: Recent Labs  Lab 01/25/24 1518  AST 65*  ALT 48*  ALKPHOS 160*  BILITOT  0.5  PROT 7.7  ALBUMIN 3.1*   Recent Labs  Lab 01/25/24 1518  LIPASE 29   No results for input(s): AMMONIA in the last 168 hours. Coagulation Profile: No results for input(s): INR, PROTIME in the last 168 hours. Cardiac Enzymes: Recent Labs  Lab 01/25/24 1518 01/25/24 1804  TROPONINIHS 10 8   Urine analysis:    Component Value Date/Time   COLORURINE YELLOW (A) 01/25/2024 1744   APPEARANCEUR CLEAR (A) 01/25/2024 1744   APPEARANCEUR Clear 10/14/2023 1705   LABSPEC 1.015 01/25/2024 1744   PHURINE 6.0 01/25/2024 1744   GLUCOSEU NEGATIVE 01/25/2024 1744   HGBUR NEGATIVE 01/25/2024 1744   HGBUR trace-intact 11/07/2009 1057   BILIRUBINUR NEGATIVE 01/25/2024 1744   BILIRUBINUR Negative 10/14/2023 1705   KETONESUR NEGATIVE 01/25/2024 1744   PROTEINUR NEGATIVE 01/25/2024 1744   UROBILINOGEN 2.0 10/25/2014 1142   UROBILINOGEN 1.0 11/07/2009 1057   NITRITE NEGATIVE 01/25/2024 1744   LEUKOCYTESUR NEGATIVE 01/25/2024 1744    Radiological Exams on Admission: I have personally reviewed images CT ABDOMEN PELVIS W CONTRAST Result Date: 01/25/2024 CLINICAL DATA:  Abdominal pain, breast cancer on chemotherapy * Tracking Code: BO * EXAM: CT ABDOMEN AND PELVIS WITH CONTRAST TECHNIQUE: Multidetector CT imaging of the abdomen and pelvis was performed using the standard protocol following bolus administration of intravenous contrast. RADIATION DOSE REDUCTION: This exam was performed according to the departmental dose-optimization program which includes automated exposure control, adjustment of the mA and/or kV according to patient size and/or use of iterative reconstruction technique. CONTRAST:  OMNIPAQUE  IOHEXOL  300 MG/ML  SOLN COMPARISON:  PET-CT, 09/25/2022 FINDINGS: Lower chest: No acute abnormality. Mild bibasilar pulmonary fibrosis. Small hiatal hernia. Hepatobiliary: No solid liver abnormality is seen. No gallstones, gallbladder wall thickening, or biliary dilatation. Pancreas:  Unremarkable. No pancreatic ductal dilatation or surrounding inflammatory changes. Spleen: Normal in size without significant abnormality. Adrenals/Urinary Tract: Adrenal glands are unremarkable. Kidneys are normal, without renal calculi, solid lesion, or hydronephrosis. Bladder is unremarkable. Stomach/Bowel: Stomach is within normal limits. Appendix appears normal. Sigmoid diverticulosis with wall thickening, mucosal hyperenhancement, and adjacent fat stranding Vascular/Lymphatic: Aortic atherosclerosis. No enlarged abdominal or pelvic lymph nodes. Reproductive: No mass or other significant abnormality. Other: Small broad-base fat containing midline ventral hernia. No ascites. Musculoskeletal: No acute or significant osseous findings. IMPRESSION: 1. Sigmoid diverticulosis with wall thickening, mucosal hyperenhancement, and adjacent fat stranding, consistent with acute uncomplicated diverticulitis. 2. No evidence of lymphadenopathy or metastatic disease in the abdomen or pelvis. 3. Mild bibasilar pulmonary fibrosis. Aortic Atherosclerosis (ICD10-I70.0). Electronically Signed   By: Marolyn JONETTA Jaksch M.D.   On: 01/25/2024 16:48    EKG: My personal interpretation of EKG shows: NSR    Assessment/Plan Principal Problem:   Acute diverticulitis Active Problems:   Hyperlipidemia LDL goal <100   Essential hypertension   GERD    Assessment and Plan: 78 year old old female W/PMH of breast cancer on ongoing chemotherapy, HTN, HLD, GERD, diverticulosis now presented with abdominal pain nausea and diarrhea.  She has a diagnosis of acute diverticulitis.  1.  Acute diverticulitis without perforation or abscess - She received Zosyn  in the emergency room. - She has leukocytosis, tachycardia tachypnea and lactic acidosis, meets criteria for sepsis. - She will be continued on Zosyn , IV fluid, n.p.o., pain medications. - She should feel better after bowel rest and antibiotic. - If she were to get worse then we may  need to call surgical consult.  2.  HTN - Blood pressure is fairly controlled - Continue to monitor  blood pressure -Hydralazine  PRN for SBP>160 - Will place her on home medications for blood pressure  3.  Hyperlipidemia - Will hold off her home medications and she can resume home medication at discharge  4.  GERD - We can give her Protonix IV  5. Mild AKI: IVF -Monitor renal function    DVT prophylaxis: Lovenox  Code Status: Full Code Family Communication: son and daughter at beside Disposition Plan: Home  Consults called: None  Admission status: Inpatient, Med-Surg   Nena Rebel, MD [facility] 01/25/2024, 6:44 PM

## 2024-01-25 NOTE — ED Notes (Signed)
 Critical Result: Lactic Acid of 2.5  Siadecki, MD aware

## 2024-01-25 NOTE — ED Provider Notes (Signed)
 [facility] Provider Note    Event Date/Time   First MD Initiated Contact with Patient 01/25/24 1529     (approximate)   History   Abdominal Pain and Weakness   HPI  [name] is a 78 y.o. female  with a history of breast cancer who presents with abdominal pain, abnormal stools, and generalized weakness.  The patient states that since her last chemotherapy treatment on 6/26 she has had progressive weakness and decreased appetite.  She reports nausea but no vomiting.  Over the last several days she has had worsening diffuse bilateral abdominal pain associated with loose stools.  The patient has seen mucus in the stools over the last few days.  She denies any blood.  I reviewed the past medical records.  The patient's most recent outpatient encounter was on 6/26 for chemotherapy.   Physical Exam   Triage Vital Signs: ED Triage Vitals  Encounter Vitals Group     BP 01/25/24 1517 (!) 89/37     Girls Systolic BP Percentile --      Girls Diastolic BP Percentile --      Boys Systolic BP Percentile --      Boys Diastolic BP Percentile --      Pulse Rate 01/25/24 1517 91     Resp 01/25/24 1517 18     Temp 01/25/24 1517 98.1 F (36.7 C)     Temp src --      SpO2 01/25/24 1517 100 %     Weight 01/25/24 1520 150 lb (68 kg)     Height 01/25/24 1520 5' (1.524 m)     Head Circumference --      Peak Flow --      Pain Score --      Pain Loc --      Pain Education --      Exclude from Growth Chart --     Most recent vital signs: Vitals:   01/25/24 1645 01/25/24 1830  BP: 126/64 (!) 147/67  Pulse: 80 73  Resp: 17 17  Temp:    SpO2: 100% 100%     General: Alert, weak appearing, no distress.  CV:  Good peripheral perfusion.  Resp:  Normal effort.  Abd:  No distention.  Soft with mild bilateral mid abdominal tenderness.  No peritoneal signs. Other:  No jaundice or scleral icterus.  Dry mucous membranes.   ED Results / Procedures / Treatments    Labs (all labs ordered are listed, but only abnormal results are displayed) Labs Reviewed  COMPREHENSIVE METABOLIC PANEL WITH GFR - Abnormal; Notable for the following components:      Result Value   Glucose, Bld 132 (*)    Creatinine, Ser 1.36 (*)    Albumin 3.1 (*)    AST 65 (*)    ALT 48 (*)    Alkaline Phosphatase 160 (*)    GFR, Estimated 41 (*)    All other components within normal limits  CBC - Abnormal; Notable for the following components:   WBC 15.3 (*)    RBC 2.98 (*)    Hemoglobin 8.8 (*)    HCT 27.0 (*)    RDW 17.2 (*)    All other components within normal limits  URINALYSIS, ROUTINE W REFLEX MICROSCOPIC - Abnormal; Notable for the following components:   Color, Urine YELLOW (*)    APPearance CLEAR (*)    All other components within normal limits  LACTIC ACID, PLASMA - Abnormal; Notable  for the following components:   Lactic Acid, Venous 2.5 (*)    All other components within normal limits  LIPASE, BLOOD  LACTIC ACID, PLASMA  COMPREHENSIVE METABOLIC PANEL WITH GFR  CBC  PROTIME-INR  TROPONIN I (HIGH SENSITIVITY)  TROPONIN I (HIGH SENSITIVITY)     EKG  ED ECG REPORT I, Waylon Cassis, the attending physician, personally viewed and interpreted this ECG.  Date: 01/25/2024 EKG Time: 1518 Rate: 92 Rhythm: normal sinus rhythm QRS Axis: normal Intervals: normal ST/T Wave abnormalities: normal Narrative Interpretation: no evidence of acute ischemia    RADIOLOGY  CT abdomen/pelvis: I independently viewed and interpreted the images; there are no dilated bowel loops or any free air or free fluid.  Radiology report indicates the following:  IMPRESSION:  1. Sigmoid diverticulosis with wall thickening, mucosal  hyperenhancement, and adjacent fat stranding, consistent with acute  uncomplicated diverticulitis.  2. No evidence of lymphadenopathy or metastatic disease in the  abdomen or pelvis.  3. Mild bibasilar pulmonary fibrosis.     PROCEDURES:  Critical Care performed: No  Procedures   MEDICATIONS ORDERED IN ED: Medications  enoxaparin  (LOVENOX ) injection 40 mg (40 mg Subcutaneous Given 01/25/24 1756)  dextrose  5 % and 0.45 % NaCl infusion ( Intravenous New Bag/Given 01/25/24 1827)  acetaminophen  (TYLENOL ) tablet 650 mg (has no administration in time range)    Or  acetaminophen  (TYLENOL ) suppository 650 mg (has no administration in time range)  HYDROmorphone  (DILAUDID ) injection 0.5-1 mg (has no administration in time range)  polyethylene glycol (MIRALAX  / GLYCOLAX ) packet 17 g (has no administration in time range)  ondansetron  (ZOFRAN ) tablet 4 mg (has no administration in time range)    Or  ondansetron  (ZOFRAN ) injection 4 mg (has no administration in time range)  piperacillin -tazobactam (ZOSYN ) IVPB 3.375 g (has no administration in time range)  hydrALAZINE  (APRESOLINE ) injection 10 mg (has no administration in time range)  sodium chloride  0.9 % bolus 1,000 mL (1,000 mLs Intravenous New Bag/Given 01/25/24 1537)  iohexol  (OMNIPAQUE ) 300 MG/ML solution 100 mL (100 mLs Intravenous Contrast Given 01/25/24 1615)  piperacillin -tazobactam (ZOSYN ) IVPB 3.375 g (0 g Intravenous Stopped 01/25/24 1825)     IMPRESSION / MDM / ASSESSMENT AND PLAN / ED COURSE  I reviewed the triage vital signs and the nursing notes.  78 year old female with PMH as noted above presents with worsening generalized weakness, decreased appetite since her most recent chemotherapy treatment as well as increased abdominal pain and loose stools with mucus.  On exam the patient is hypotensive.  Other vital signs are normal.  The abdomen is soft with no focal tenderness .  Differential diagnosis includes, but is not limited to, dehydration, electrolyte abnormality, AKI, other metabolic disturbance, colitis, diverticulitis, other acute intra-abdominal infection, sepsis.  We will fluids, obtain lab workup and CT and reassess.  Patient's presentation is  most consistent with acute presentation with potential threat to life or bodily function.  The patient is on the cardiac monitor to evaluate for evidence of arrhythmia and/or significant heart rate changes.  ----------------------------------------- 6:49 PM on 01/25/2024 -----------------------------------------  Lab workup is significant for leukocytosis.  The patient worsened anemia from recent baseline.  Lactate and troponin are both negative.  Vital signs have improved.  The blood pressure is now normal.  CT shows evidence of diverticulitis which is consistent with the patient's symptoms.  She will need inpatient admission for further management especially given her hypotension earlier.  I gust case with Dr. Roann from the hospitalist service; based on our  discussion he agreed to evaluate the patient for admission.   FINAL CLINICAL IMPRESSION(S) / ED DIAGNOSES   Final diagnoses:  Diverticulitis     Rx / DC Orders   ED Discharge Orders     None        Note:  This document was prepared using Dragon voice recognition software and may include unintentional dictation errors.    Jacolyn Pae, MD 01/25/24 1850

## 2024-01-25 NOTE — ED Notes (Signed)
 Call son when the patient is moved upstairs

## 2024-01-26 ENCOUNTER — Encounter: Payer: Self-pay | Admitting: Hospitalist

## 2024-01-26 DIAGNOSIS — K5792 Diverticulitis of intestine, part unspecified, without perforation or abscess without bleeding: Secondary | ICD-10-CM | POA: Diagnosis not present

## 2024-01-26 LAB — PROTIME-INR
INR: 1.2 (ref 0.8–1.2)
Prothrombin Time: 15.6 s — ABNORMAL HIGH (ref 11.4–15.2)

## 2024-01-26 LAB — COMPREHENSIVE METABOLIC PANEL WITH GFR
ALT: 36 U/L (ref 0–44)
AST: 39 U/L (ref 15–41)
Albumin: 2.6 g/dL — ABNORMAL LOW (ref 3.5–5.0)
Alkaline Phosphatase: 123 U/L (ref 38–126)
Anion gap: 10 (ref 5–15)
BUN: 14 mg/dL (ref 8–23)
CO2: 22 mmol/L (ref 22–32)
Calcium: 8.7 mg/dL — ABNORMAL LOW (ref 8.9–10.3)
Chloride: 104 mmol/L (ref 98–111)
Creatinine, Ser: 0.97 mg/dL (ref 0.44–1.00)
GFR, Estimated: >60 mL/min (ref >60)
Glucose, Bld: 125 mg/dL — ABNORMAL HIGH (ref 70–99)
Potassium: 3.9 mmol/L (ref 3.5–5.1)
Sodium: 136 mmol/L (ref 135–145)
Total Bilirubin: 0.6 mg/dL (ref 0.0–1.2)
Total Protein: 6.1 g/dL — ABNORMAL LOW (ref 6.5–8.1)

## 2024-01-26 LAB — CBC
HCT: 24 % — ABNORMAL LOW (ref 36.0–46.0)
Hemoglobin: 7.8 g/dL — ABNORMAL LOW (ref 12.0–15.0)
MCH: 29.1 pg (ref 26.0–34.0)
MCHC: 32.5 g/dL (ref 30.0–36.0)
MCV: 89.6 fL (ref 80.0–100.0)
Platelets: 252 K/uL (ref 150–400)
RBC: 2.68 MIL/uL — ABNORMAL LOW (ref 3.87–5.11)
RDW: 17.2 % — ABNORMAL HIGH (ref 11.5–15.5)
WBC: 13.3 K/uL — ABNORMAL HIGH (ref 4.0–10.5)
nRBC: 0 % (ref 0.0–0.2)

## 2024-01-26 MED ORDER — BOOST / RESOURCE BREEZE PO LIQD CUSTOM
1.0000 | Freq: Three times a day (TID) | ORAL | Status: DC
  Administered 2024-01-26 – 2024-01-27 (×3): 1 via ORAL
  Administered 2024-01-28: 237 mL via ORAL
  Administered 2024-01-28 – 2024-01-29 (×3): 1 via ORAL

## 2024-01-26 MED ORDER — LOSARTAN POTASSIUM 50 MG PO TABS
100.0000 mg | ORAL_TABLET | Freq: Every day | ORAL | Status: DC
  Administered 2024-01-26 – 2024-02-03 (×9): 100 mg via ORAL
  Filled 2024-01-26 (×9): qty 2

## 2024-01-26 MED ORDER — LOSARTAN POTASSIUM-HCTZ 100-25 MG PO TABS: 1.0000 | ORAL_TABLET | Freq: Every day | ORAL | Status: DC

## 2024-01-26 MED ORDER — HYDROCHLOROTHIAZIDE 25 MG PO TABS
25.0000 mg | ORAL_TABLET | Freq: Every day | ORAL | Status: DC
  Administered 2024-01-26 – 2024-01-28 (×3): 25 mg via ORAL
  Filled 2024-01-26 (×3): qty 1

## 2024-01-26 MED ORDER — SODIUM CHLORIDE 0.9 % IV SOLN: INTRAVENOUS | Status: DC

## 2024-01-26 MED ORDER — SODIUM CHLORIDE 0.9% FLUSH
500.0000 mL | Freq: Once | INTRAVENOUS | Status: AC
  Administered 2024-01-26: 500 mL via INTRAVENOUS

## 2024-01-26 NOTE — Progress Notes (Signed)
 Progress Note   Patient: [name] FMW:[id number] DOB: [birth year] DOA: 01/25/2024     1 DOS: the patient was seen and examined on 01/26/2024   Brief hospital course: 78 year old old female W/PMH of breast cancer on ongoing chemotherapy, HTN, HLD, GERD, diverticulosis now presented with abdominal pain, nausea, and diarrhea.  Per admission HPI FATHIMA BARTL is a pleasant 78 y.o. female with medical history significant for breast cancer diagnosed in April 2025 s/p chemo third round on 01/15/2024, HTN, HLD, sleep apnea, diverticulosis of colon, chronic back pain who was brought in to the emergency room with abdominal pain, loose stool and generalized weakness.  Patient is stated that she has been having the symptoms for the last 5 days.  She denies any hematemesis or melena.  Denies any fever or chills.  She has a decreased appetite and some nausea but no vomiting.  This morning patient's daughter-in-law noticed in her loose stool that there was mucus but no blood and they were concerned and they brought her to the hospital.   7/7 advanced to clear liquid diet  Assessment and Plan: Acute diverticulitis without perforation or abscess Leukocytosis is improving.  Remains afebrile.   - Continue IV Zosyn  -Advance diet to clear liquid diet  Hypertension Patient with some low blood pressures, requiring a bolus.  Blood pressures this a.m. mostly normotensive. -Hold home antihypertensives  Hyperlipidemia Hold home lipid-lowering medications  GERD Continue IV Protonix  AKI (resolved)    Latest Ref Rng & Units 01/26/2024    4:20 AM 01/25/2024    3:18 PM 01/15/2024    9:58 AM  BMP  Glucose 70 - 99 mg/dL 874  867  861   BUN 8 - 23 mg/dL 14  19  18    Creatinine 0.44 - 1.00 mg/dL 9.02  8.63  8.85   Sodium 135 - 145 mmol/L 136  137  136   Potassium 3.5 - 5.1 mmol/L 3.9  4.7  4.1   Chloride 98 - 111 mmol/L 104  100  100   CO2 22 - 32 mmol/L 22  24  23    Calcium  8.9 - 10.3 mg/dL 8.7  9.7  9.6     Normocytic anemia  Iron/TIBC/Ferritin/ %Sat    Component Value Date/Time   IRON 77 12/25/2023 1022   TIBC 287 12/25/2023 1022   FERRITIN 166 12/25/2023 1022   IRONPCTSAT 27 12/25/2023 1022  Possible delusional component.  No obvious bleeding noted.   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  Currently on chemotherapy.     Subjective: Feels abdominal pain is improved. Not eating for days. Now with an appetite. No nausea. No diarrhea but mucus in pull up.  Physical Exam: Vitals:   01/26/24 0700 01/26/24 0730 01/26/24 0830 01/26/24 0952  BP: (!) 113/50 (!) 108/51 (!) 111/47 (!) 120/42  Pulse: 85 84 81 86  Resp: (!) 26 (!) 27 (!) 26 20  Temp:   98.6 F (37 C)   TempSrc:   Oral   SpO2: 96% 100% 100% 100%  Weight:      Height:       Physical Exam  Constitutional: In no distress.  Cardiovascular: Normal rate, regular rhythm. No lower extremity edema  Pulmonary: Non labored breathing on room air, no wheezing or rales.   Abdominal: Soft. Non distended, TTP in LLQ Musculoskeletal: Normal range of motion.     Neurological: Alert and oriented to person, place, and time. Non focal  Skin: Skin is warm and dry.  Data Reviewed:     Latest Ref Rng & Units 01/26/2024    4:20 AM 01/25/2024    3:18 PM 01/15/2024    9:58 AM  BMP  Glucose 70 - 99 mg/dL 874  867  861   BUN 8 - 23 mg/dL 14  19  18    Creatinine 0.44 - 1.00 mg/dL 9.02  8.63  8.85   Sodium 135 - 145 mmol/L 136  137  136   Potassium 3.5 - 5.1 mmol/L 3.9  4.7  4.1   Chloride 98 - 111 mmol/L 104  100  100   CO2 22 - 32 mmol/L 22  24  23    Calcium  8.9 - 10.3 mg/dL 8.7  9.7  9.6      Family Communication: Discussed with patient and she endorsed understanding of the plan.   Disposition: Status is: Inpatient Remains inpatient appropriate because: IV antibiotics  Planned Discharge Destination: Pending further evaluation.     Time spent: 35 minutes  Author: Alban Pepper, MD 01/26/2024 10:25 AM  For on call  review [email or web address].

## 2024-01-26 NOTE — Progress Notes (Signed)
 Initially hypotensive, but currently blood pressure significantly elevated, most recently 184/88, so I will resume her home medications, and decrease IV fluid to 75 cc/h as she is on clear liquid diet.  Brayton Lye MD

## 2024-01-26 NOTE — ED Notes (Signed)
Informed RN bed assigned 

## 2024-01-26 NOTE — ED Notes (Signed)
 MD aware BP 89/47 (60). Per Mansy MD, give 500ml NS bolus and then start NS at 150ml/hr.

## 2024-01-27 DIAGNOSIS — K5792 Diverticulitis of intestine, part unspecified, without perforation or abscess without bleeding: Secondary | ICD-10-CM | POA: Diagnosis not present

## 2024-01-27 LAB — CBC WITH DIFFERENTIAL/PLATELET
Abs Immature Granulocytes: 0.51 K/uL — ABNORMAL HIGH (ref 0.00–0.07)
Basophils Absolute: 0.1 K/uL (ref 0.0–0.1)
Basophils Relative: 1 %
Eosinophils Absolute: 0 K/uL (ref 0.0–0.5)
Eosinophils Relative: 0 %
HCT: 25.7 % — ABNORMAL LOW (ref 36.0–46.0)
Hemoglobin: 8.4 g/dL — ABNORMAL LOW (ref 12.0–15.0)
Immature Granulocytes: 3 %
Lymphocytes Relative: 14 %
Lymphs Abs: 2.1 K/uL (ref 0.7–4.0)
MCH: 28.9 pg (ref 26.0–34.0)
MCHC: 32.7 g/dL (ref 30.0–36.0)
MCV: 88.3 fL (ref 80.0–100.0)
Monocytes Absolute: 1.3 K/uL — ABNORMAL HIGH (ref 0.1–1.0)
Monocytes Relative: 8 %
Neutro Abs: 11.4 K/uL — ABNORMAL HIGH (ref 1.7–7.7)
Neutrophils Relative %: 74 %
Platelets: 311 K/uL (ref 150–400)
RBC: 2.91 MIL/uL — ABNORMAL LOW (ref 3.87–5.11)
RDW: 17.3 % — ABNORMAL HIGH (ref 11.5–15.5)
WBC: 15.4 K/uL — ABNORMAL HIGH (ref 4.0–10.5)
nRBC: 0.1 % (ref 0.0–0.2)

## 2024-01-27 LAB — COMPREHENSIVE METABOLIC PANEL WITH GFR
ALT: 29 U/L (ref 0–44)
AST: 35 U/L (ref 15–41)
Albumin: 2.5 g/dL — ABNORMAL LOW (ref 3.5–5.0)
Alkaline Phosphatase: 128 U/L — ABNORMAL HIGH (ref 38–126)
Anion gap: 10 (ref 5–15)
BUN: 8 mg/dL (ref 8–23)
CO2: 21 mmol/L — ABNORMAL LOW (ref 22–32)
Calcium: 8.9 mg/dL (ref 8.9–10.3)
Chloride: 111 mmol/L (ref 98–111)
Creatinine, Ser: 0.9 mg/dL (ref 0.44–1.00)
GFR, Estimated: >60 mL/min (ref >60)
Glucose, Bld: 137 mg/dL — ABNORMAL HIGH (ref 70–99)
Potassium: 4 mmol/L (ref 3.5–5.1)
Sodium: 142 mmol/L (ref 135–145)
Total Bilirubin: 1.1 mg/dL (ref 0.0–1.2)
Total Protein: 6 g/dL — ABNORMAL LOW (ref 6.5–8.1)

## 2024-01-27 MED ORDER — TRAZODONE HCL 50 MG PO TABS
50.0000 mg | ORAL_TABLET | Freq: Every evening | ORAL | Status: DC | PRN
  Administered 2024-01-27 – 2024-02-02 (×7): 50 mg via ORAL
  Filled 2024-01-27 (×7): qty 1

## 2024-01-27 NOTE — Evaluation (Signed)
 Occupational Therapy Evaluation Patient Details Name: [name] MRN: [id number] DOB: 03/09/1946 Today's Date: 01/27/2024   History of Present Illness   [name] is a 77yoF who comes to [facility] 01/25/24 c belly pain- reports decreased PO, annorexia since last chemo on 6/26. PMH: BrCA on chemotherapy, HTN, HLD, sleep apnea, diverticulosis of colon, chronic back pain. In ED BP 89/97mmHg. Pt admitted with diagnosis of acute diverticulitis.     Clinical Impressions Patient presenting with decreased Ind in self care,balance, functional mobility/transfers, endurance, and safety awareness.  Patient reports being Mod I at baseline with use of SPC and living with son since getting chemo treatments. He does work outside of the home. Pt endorses self care tasks have become very difficult to complete. Pt ambulates with min guard- min A with use of RW for toileting needs and standing hand hygiene before returning back to recliner chair. Pt fatigues quickly. Patient will benefit from acute OT to increase overall independence in the areas of ADLs, functional mobility, and safety awareness in order to safely discharge.     If plan is discharge home, recommend the following:   A little help with walking and/or transfers;A little help with bathing/dressing/bathroom;Assistance with cooking/housework;Assist for transportation     Functional Status Assessment   Patient has had a recent decline in their functional status and demonstrates the ability to make significant improvements in function in a reasonable and predictable amount of time.     Equipment Recommendations   Other (comment) (defer to next venue of care)      Precautions/Restrictions   Precautions Precautions: Fall     Mobility Bed Mobility               General bed mobility comments: seated in recliner chair at end of session    Transfers Overall transfer level: Needs assistance Equipment used: Straight  cane Transfers: Sit to/from Stand Sit to Stand: Contact guard assist, Min assist                  Balance Overall balance assessment: Needs assistance Sitting-balance support: Feet supported Sitting balance-Leahy Scale: Good     Standing balance support: During functional activity, Single extremity supported Standing balance-Leahy Scale: Fair                             ADL either performed or assessed with clinical judgement   ADL Overall ADL's : Needs assistance/impaired     Grooming: Contact guard assist;Standing                   Toilet Transfer: Minimal assistance;Rolling walker (2 wheels);Regular Toilet;Grab bars   Toileting- Clothing Manipulation and Hygiene: Minimal assistance;Sit to/from stand       Functional mobility during ADLs: Minimal assistance       Vision Patient Visual Report: No change from baseline              Pertinent Vitals/Pain Pain Assessment Pain Assessment: No/denies pain     Extremity/Trunk Assessment Upper Extremity Assessment Upper Extremity Assessment: Generalized weakness   Lower Extremity Assessment Lower Extremity Assessment: Generalized weakness       Communication Communication Communication: No apparent difficulties   Cognition Arousal: Alert Behavior During Therapy: WFL for tasks assessed/performed Cognition: No apparent impairments  Following commands: Intact       Cueing  General Comments   Cueing Techniques: Verbal cues              Home Living Family/patient expects to be discharged to:: Private residence Living Arrangements: Children (living with son since starting chemo) Available Help at Discharge: Family;Available PRN/intermittently Type of Home: House       Home Layout: One level     Bathroom Shower/Tub: Tub/shower unit         Home Equipment: Shower seat;Cane - single point          Prior  Functioning/Environment Prior Level of Function : Independent/Modified Independent             Mobility Comments: recently household AMB only with SPC; no recent falls. ADLs Comments: modI but struggles with preparing meals for self, bathing self safely and takes increased time to complete tasks    OT Problem List: Decreased strength;Decreased activity tolerance;Impaired balance (sitting and/or standing);Decreased safety awareness;Decreased knowledge of use of DME or AE   OT Treatment/Interventions: Self-care/ADL training;Therapeutic exercise;Energy conservation;Therapeutic activities;DME and/or AE instruction;Patient/family education;Balance training      OT Goals(Current goals can be found in the care plan section)   Acute Rehab OT Goals Patient Stated Goal: to get stronger OT Goal Formulation: With patient Time For Goal Achievement: 02/10/24 Potential to Achieve Goals: Fair ADL Goals Pt Will Perform Grooming: with modified independence;standing Pt Will Perform Lower Body Dressing: with modified independence;sit to/from stand Pt Will Transfer to Toilet: with modified independence;ambulating Pt Will Perform Toileting - Clothing Manipulation and hygiene: with modified independence;sit to/from stand   OT Frequency:  Min 2X/week       AM-PAC OT 6 Clicks Daily Activity     Outcome Measure Help from another person eating meals?: None Help from another person taking care of personal grooming?: None Help from another person toileting, which includes using toliet, bedpan, or urinal?: A Little Help from another person bathing (including washing, rinsing, drying)?: A Little Help from another person to put on and taking off regular upper body clothing?: None Help from another person to put on and taking off regular lower body clothing?: A Little 6 Click Score: 21   End of Session Equipment Utilized During Treatment: Other (comment) [facility]) Nurse Communication: Mobility  status  Activity Tolerance: Patient tolerated treatment well Patient left: in chair;with call bell/phone within reach;with chair alarm set  OT Visit Diagnosis: Unsteadiness on feet (R26.81);Muscle weakness (generalized) (M62.81)                Time: 8864-8850 OT Time Calculation (min): 14 min Charges:  OT General Charges $OT Visit: 1 Visit OT Evaluation $OT Eval Low Complexity: 1 Low OT Treatments $Self Care/Home Management : 8-22 mins  Izetta Claude, MS, OTR/L , CBIS ascom [phone]  01/27/24, 4:05 PM

## 2024-01-27 NOTE — Evaluation (Signed)
 Physical Therapy Evaluation Patient Details Name: [name] MRN: [id number] DOB: 1946/05/10 Today's Date: 01/27/2024  History of Present Illness  [name] is a 77yoF who comes to [facility] 01/25/24 Wagner belly pain- reports decreased PO, annorexia since last chemo on 6/26. PMH: BrCA on chemotherapy, HTN, HLD, sleep apnea, diverticulosis of colon, chronic back pain. In ED BP 89/49mmHg. Pt admitted with diagnosis of acute diverticulitis.  Clinical Impression  Pt agreeable to PT assessment, does fairly well in general with bed mobility, transfers, and short distance gait, but does endorse feeling though her knees would buckle while AMB. Pt is able to AMB to BR after short break in room. ABD pain much improved. Pt demonstrate proficient SPC use in gait. Pt reports recent difficulty with feeling safe/confidence with safe care activities, also feeling unsafe to prepare her own meals when alone during the day. Pt agreeable that a STR stay could be instrumental in optimizing her strength and function for eventual return to home.       If plan is discharge home, recommend the following: Assistance with cooking/housework;Help with stairs or ramp for entrance;Assist for transportation;Direct supervision/assist for medications management   Can travel by private vehicle   Yes    Equipment Recommendations None recommended by PT  Recommendations for Other Services       Functional Status Assessment Patient has had a recent decline in their functional status and demonstrates the ability to make significant improvements in function in a reasonable and predictable amount of time.     Precautions / Restrictions Precautions Precautions: Fall Restrictions Weight Bearing Restrictions Per Provider Order: No      Mobility  Bed Mobility Overal bed mobility: Modified Independent             General bed mobility comments: heavy railing use to EOB right, mod effort, no pain, no dizzies     Transfers Overall transfer level: Needs assistance Equipment used: Straight cane Transfers: Sit to/from Stand Sit to Stand: Supervision                Ambulation/Gait Ambulation/Gait assistance: Contact guard assist, Supervision Gait Distance (Feet): 100 Feet Assistive device: Straight cane Gait Pattern/deviations: Step-to pattern       General Gait Details: No LOB, slow and steady, appropriate sequencing with RUE SPC paired with LLE.  Stairs            Wheelchair Mobility     Tilt Bed    Modified Rankin (Stroke Patients Only)       Balance                                             Pertinent Vitals/Pain Pain Assessment Pain Assessment: No/denies pain    Home Living Family/patient expects to be discharged to:: Private residence Living Arrangements: Children (living with son right now since starting chemo; he works 1st shift in [place].) Available Help at Discharge: Family           Home Layout: One level Home Equipment: Production assistant, radio - single point      Prior Function Prior Level of Function : Independent/Modified Independent             Mobility Comments: recently household AMB only with SPC; no recent falls. ADLs Comments: modI but struggles with preparing meals for self, bathing self safely.     Extremity/Trunk Assessment  Communication        Cognition                                         Cueing       General Comments      Exercises     Assessment/Plan    PT Assessment Patient needs continued PT services  PT Problem List Decreased strength;Decreased range of motion;Decreased activity tolerance;Decreased balance;Decreased mobility;Decreased knowledge of use of DME;Decreased safety awareness;Decreased knowledge of precautions       PT Treatment Interventions DME instruction;Stair training;Functional mobility training;Therapeutic activities;Therapeutic  exercise;Patient/family education    PT Goals (Current goals can be found in the Care Plan section)  Acute Rehab PT Goals Patient Stated Goal: be more independent with IADL, self care PT Goal Formulation: With patient Time For Goal Achievement: 02/10/24 Potential to Achieve Goals: Good    Frequency Min 2X/week     Co-evaluation               AM-PAC PT 6 Clicks Mobility  Outcome Measure Help needed turning from your back to your side while in a flat bed without using bedrails?: A Little Help needed moving from lying on your back to sitting on the side of a flat bed without using bedrails?: A Little Help needed moving to and from a bed to a chair (including a wheelchair)?: A Little Help needed standing up from a chair using your arms (e.g., wheelchair or bedside chair)?: A Little Help needed to walk in hospital room?: A Little Help needed climbing 3-5 steps with a railing? : A Little 6 Click Score: 18    End of Session   Activity Tolerance: Patient tolerated treatment well;No increased pain Patient left: in chair;with call bell/phone within reach Nurse Communication: Mobility status PT Visit Diagnosis: Other abnormalities of gait and mobility (R26.89);Unsteadiness on feet (R26.81);Difficulty in walking, not elsewhere classified (R26.2)    Time: 8941-8875 PT Time Calculation (min) (ACUTE ONLY): 26 min   Charges:   PT Evaluation $PT Eval Moderate Complexity: 1 Mod PT Treatments $Therapeutic Activity: 8-22 mins PT General Charges $$ ACUTE PT VISIT: 1 Visit    12:19 PM, 01/27/24 [name], PT, DPT Physical Therapist - [facility]  [phone] (ASCOM)    [name] 01/27/2024, 12:15 PM

## 2024-01-27 NOTE — Hospital Course (Signed)
 Feeling better. Had a bowel movement this AM with no pain.

## 2024-01-27 NOTE — TOC Initial Note (Signed)
 Transition of Care [facility]) - Initial/Assessment Note    Patient Details  Name: [name] MRN: [id number] Date of Birth: 09-Feb-1946  Transition of Care [facility]) CM/SW Contact:    Elouise LULLA Capri, RN 01/27/2024, 1:38 PM  Clinical Narrative:                  CM to patient's room regarding case management assessment. Patient lives at home with son, [name]. Per patient uses a quad cane. Patient's daughter, Bascom at patient's bedside. Per patient, no previous home health or SNF experience. CM and patient,  patient's daughter and patient's son, [name] discussed SNF recommendations. Per patient and patient's family declined SNF and prefers home health. CM alert to Dr. Franchot regarding patient preference to discharge to home with home health. CM call to [facility], phone: [phone] regarding home heath PT/OT referral. Per [facility], agency will accept patient for home health services.  Current Facility-Administered Medications  Medication Dose Route Frequency Provider Last Rate Last Admin   acetaminophen  (TYLENOL ) tablet 650 mg  650 mg Oral Q6H PRN Roann Gouty, MD       Or   acetaminophen  (TYLENOL ) suppository 650 mg  650 mg Rectal Q6H PRN Paudel, Gouty, MD       albuterol  (PROVENTIL ) (2.5 MG/3ML) 0.083% nebulizer solution 2.5 mg  2.5 mg Inhalation Q6H PRN Paudel, Keshab, MD       enoxaparin  (LOVENOX ) injection 40 mg  40 mg Subcutaneous Q24H Paudel, Keshab, MD   40 mg at 01/26/24 1654   feeding supplement (BOOST / RESOURCE BREEZE) liquid 1 Container  1 Container Oral TID BM Franchot Novel, MD   1 Container at 01/26/24 2135   gabapentin  (NEURONTIN ) capsule 100 mg  100 mg Oral QHS Paudel, Keshab, MD   100 mg at 01/26/24 2036   hydrALAZINE  (APRESOLINE ) injection 10 mg  10 mg Intravenous Q6H PRN Paudel, Keshab, MD   10 mg at 01/26/24 2034   losartan  (COZAAR ) tablet 100 mg  100 mg Oral Daily Franchot Novel, MD   100 mg at 01/27/24 1010   And   hydrochlorothiazide   (HYDRODIURIL ) tablet 25 mg  25 mg Oral Daily Franchot Novel, MD   25 mg at 01/27/24 1010   HYDROmorphone  (DILAUDID ) injection 0.5-1 mg  0.5-1 mg Intravenous Q2H PRN Paudel, Keshab, MD   1 mg at 01/26/24 8147   metoprolol  succinate (TOPROL -XL) 24 hr tablet 50 mg  50 mg Oral Daily Paudel, Gouty, MD   50 mg at 01/27/24 1010   ondansetron  (ZOFRAN ) tablet 4 mg  4 mg Oral Q6H PRN Paudel, Keshab, MD       Or   ondansetron  (ZOFRAN ) injection 4 mg  4 mg Intravenous Q6H PRN Paudel, Keshab, MD   4 mg at 01/27/24 1142   piperacillin -tazobactam (ZOSYN ) IVPB 3.375 g  3.375 g Intravenous Q8H Paudel, Gouty, MD 12.5 mL/hr at 01/27/24 0559 3.375 g at 01/27/24 0559   D    Barriers to Discharge: Continued Medical Work up   Patient Goals and CMS Choice            Expected Discharge Plan and Services       Living arrangements for the past 2 months: Single Family Home                                      Prior Living Arrangements/Services Living arrangements for the past 2 months:  Single Family Home Lives with:: Adult Children Patient language and need for interpreter reviewed:: No        Need for Family Participation in Patient Care: Yes (Comment) Care giver support system in place?: Yes (comment) Current home services: DME (4 Point cane) Criminal Activity/Legal Involvement Pertinent to Current Situation/Hospitalization: No - Comment as needed  Activities of Daily Living   ADL Screening (condition at time of admission) Independently performs ADLs?: No Does the patient have a NEW difficulty with bathing/dressing/toileting/self-feeding that is expected to last >3 days?: Yes (Initiates electronic notice to provider for possible OT consult) Does the patient have a NEW difficulty with getting in/out of bed, walking, or climbing stairs that is expected to last >3 days?: Yes (Initiates electronic notice to provider for possible PT consult) Does the patient have a NEW difficulty with  communication that is expected to last >3 days?: No Is the patient deaf or have difficulty hearing?: No Does the patient have difficulty seeing, even when wearing glasses/contacts?: No Does the patient have difficulty concentrating, remembering, or making decisions?: No  Permission Sought/Granted Permission sought to share information with : Case Manager, Family Supports Permission granted to share information with : Yes, Verbal Permission Granted  Share Information with NAME: Charissa Knowles     Permission granted to share info w Relationship: Son/Daughter     Emotional Assessment Appearance:: Appears older than stated age Attitude/Demeanor/Rapport: Engaged Affect (typically observed): Calm Orientation: : Oriented to Self, Oriented to Place, Oriented to  Time Alcohol / Substance Use: Not Applicable    Admission diagnosis:  Diverticulitis [K57.92] Acute diverticulitis [K57.92] Patient Active Problem List   Diagnosis Date Noted   Acute diverticulitis 01/25/2024   Constipation 01/15/2024   Invasive ductal carcinoma of breast, female, right (HCC) 10/22/2023   Dysuria 10/14/2023   BRCA2 gene mutation positive 10/13/2023   Genetic testing 10/10/2023   Breast cancer of lower-inner quadrant of right female breast (HCC) 09/16/2023   Depression, major, single episode, severe (HCC) 08/25/2023   Encounter for Medicare annual examination with abnormal findings 07/21/2023   Complicated grief 05/12/2023   Insomnia due to anxiety and fear 02/01/2023   Decreased hearing 12/15/2022   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   Thrombocytopenia (HCC) 12/03/2022   Hypokalemia 12/01/2022   Hypomagnesemia 12/01/2022   Transient alteration of awareness 12/01/2022   Bilateral lower extremity edema 10/18/2022   Fatigue due to excessive exertion 06/25/2022   Reduced vision 06/18/2021   Osteopenia 06/17/2021   Upper airway cough syndrome 06/13/2021   Chronic respiratory failure  with hypoxia (HCC) 01/29/2021   DOE (dyspnea on exertion) 01/29/2021   Bilateral shoulder pain 08/14/2020   Trigger finger, left little finger 06/09/2019   Alteration in skin integrity due to moisture 03/30/2019   Chronic right shoulder pain 07/05/2018   Intermittent knee pain 05/28/2018   Vitamin D  deficiency 11/22/2015   Back pain 09/15/2013   Dermatomycosis 12/22/2012   Carpal tunnel syndrome on left 07/31/2011   Prediabetes 10/16/2007   Hyperlipidemia LDL goal <100 10/16/2007   Morbid obesity due to excess calories (HCC) complicated by hbp/hyperlipidemia 10/16/2007   Anxiety and depression 10/16/2007   Essential hypertension 10/16/2007   Pulmonary hypertension (HCC) 10/16/2007   GERD 10/16/2007   DIVERTICULOSIS OF COLON 10/16/2007   OSTEOARTHRITIS, KNEES, 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 [place] [address] Phone: [phone] Fax: [phone]  [id number] - [place], [place] - [address] AT [facility] [address] [place] [address] Phone: [phone] Fax: [phone]  [id number] - [place], [place] - [address] [place] [address] Phone: [phone] Fax: [phone]     Social Drivers of Health (SDOH) Social History: SDOH Screenings   Food Insecurity: No Food Insecurity (01/26/2024)  Housing: Low Risk  (01/26/2024)  Transportation Needs: No Transportation Needs (01/26/2024)  Utilities: Not At Risk (01/26/2024)  Alcohol Screen: Low Risk  (08/12/2023)  Depression (PHQ2-9): Low Risk   (01/15/2024)  Financial Resource Strain: Low Risk  (08/12/2023)  Physical Activity: Sufficiently Active (08/12/2023)  Social Connections: Unknown (01/26/2024)  Stress: No Stress Concern Present (08/12/2023)  Tobacco Use: Low Risk  (01/26/2024)  Health Literacy: Adequate Health Literacy (08/12/2023)   SDOH Interventions:     Readmission Risk Interventions     No data to display

## 2024-01-27 NOTE — Progress Notes (Signed)
 Progress Note   Patient: [name] FMW:[id number] DOB: 29-Mar-1946 DOA: 01/25/2024     2 DOS: the patient was seen and examined on 01/27/2024   Brief hospital course: 78 year old old female W/PMH of breast cancer on ongoing chemotherapy, HTN, HLD, GERD, diverticulosis now presented with abdominal pain, nausea, and diarrhea.  Per admission HPI [name] is a pleasant 78 y.o. female with medical history significant for breast cancer diagnosed in April 2025 s/p chemo third round on 01/15/2024, HTN, HLD, sleep apnea, diverticulosis of colon, chronic back pain who was brought in to the emergency room with abdominal pain, loose stool and generalized weakness.  Patient is stated that she has been having the symptoms for the last 5 days.  She denies any hematemesis or melena.  Denies any fever or chills.  She has a decreased appetite and some nausea but no vomiting.  This morning patient's daughter-in-law noticed in her loose stool that there was mucus but no blood and they were concerned and they brought her to the hospital.   7/7 advanced to clear liquid diet  Assessment and Plan: Acute diverticulitis without perforation or abscess Leukocytosis worse this AM.  Remains afebrile.   -Continue IV Zosyn , If leukocytosis improves and continues to tolerate PO will transition to oral antibiotics.  -Advance diet to full liquid diet  Hypertension Patient with some low blood pressures, requiring a bolus.  Blood pressures now elevated. -Home antihypertensives were resumed and IV fluids stopped.    Hyperlipidemia Hold home lipid-lowering medications. Can resume on discharge.   GERD Continue IV Protonix  AKI (resolved)    Latest Ref Rng & Units 01/27/2024   10:47 AM 01/26/2024    4:20 AM 01/25/2024    3:18 PM  BMP  Glucose 70 - 99 mg/dL 862  874  867   BUN 8 - 23 mg/dL 8  14  19    Creatinine 0.44 - 1.00 mg/dL 9.09  9.02  8.63   Sodium 135 - 145 mmol/L 142  136  137   Potassium 3.5 - 5.1 mmol/L  4.0  3.9  4.7   Chloride 98 - 111 mmol/L 111  104  100   CO2 22 - 32 mmol/L 21  22  24    Calcium  8.9 - 10.3 mg/dL 8.9  8.7  9.7    Normocytic anemia  Iron/TIBC/Ferritin/ %Sat    Component Value Date/Time   IRON 77 12/25/2023 1022   TIBC 287 12/25/2023 1022   FERRITIN 166 12/25/2023 1022   IRONPCTSAT 27 12/25/2023 1022  Possible dilutional component.  No obvious bleeding noted. No need for transfusion at this time. Continue to monitor.   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  Currently on chemotherapy.     Subjective: No abdominal pain. Had a bowel movement early this AM. Tolerated her clear liquid diet with no nausea or vomiting.    Physical Exam: Vitals:   01/26/24 2236 01/27/24 0404 01/27/24 0806 01/27/24 1209  BP: (!) 161/77 (!) 148/69 (!) 162/81 138/78  Pulse: (!) 108 98 97 (!) 101  Resp:  18 16 15   Temp:  98.8 F (37.1 C) 98.5 F (36.9 C) 99.1 F (37.3 C)  TempSrc:      SpO2: 99% 98% 99% 98%  Weight:      Height:       Physical Exam  Constitutional: In no distress.  Cardiovascular: Normal rate, regular rhythm. No lower extremity edema  Chest: L chest port  Pulmonary: Non labored breathing on  room air, no wheezing or rales.   Abdominal: Soft. Non distended and non tender Musculoskeletal: Normal range of motion.     Neurological: Alert and oriented to person, place, and time. Non focal  Skin: Skin is warm and dry.   Data Reviewed:     Latest Ref Rng & Units 01/27/2024   10:47 AM 01/26/2024    4:20 AM 01/25/2024    3:18 PM  BMP  Glucose 70 - 99 mg/dL 862  874  867   BUN 8 - 23 mg/dL 8  14  19    Creatinine 0.44 - 1.00 mg/dL 9.09  9.02  8.63   Sodium 135 - 145 mmol/L 142  136  137   Potassium 3.5 - 5.1 mmol/L 4.0  3.9  4.7   Chloride 98 - 111 mmol/L 111  104  100   CO2 22 - 32 mmol/L 21  22  24    Calcium  8.9 - 10.3 mg/dL 8.9  8.7  9.7      Family Communication: Discussed with patient and she endorsed understanding of the plan.   Disposition: Status  is: Inpatient Remains inpatient appropriate because: IV antibiotics  Planned Discharge Destination: Pending further evaluation.     Time spent: 35 minutes  Author: Alban Pepper, MD 01/27/2024 4:00 PM  For on call review [email or web address].

## 2024-01-28 ENCOUNTER — Inpatient Hospital Stay

## 2024-01-28 DIAGNOSIS — K219 Gastro-esophageal reflux disease without esophagitis: Secondary | ICD-10-CM | POA: Diagnosis not present

## 2024-01-28 DIAGNOSIS — I10 Essential (primary) hypertension: Secondary | ICD-10-CM | POA: Diagnosis not present

## 2024-01-28 DIAGNOSIS — K5792 Diverticulitis of intestine, part unspecified, without perforation or abscess without bleeding: Secondary | ICD-10-CM | POA: Diagnosis not present

## 2024-01-28 DIAGNOSIS — E785 Hyperlipidemia, unspecified: Secondary | ICD-10-CM | POA: Diagnosis not present

## 2024-01-28 LAB — COMPREHENSIVE METABOLIC PANEL WITH GFR
ALT: 28 U/L (ref 0–44)
AST: 34 U/L (ref 15–41)
Albumin: 2.8 g/dL — ABNORMAL LOW (ref 3.5–5.0)
Alkaline Phosphatase: 119 U/L (ref 38–126)
Anion gap: 14 (ref 5–15)
BUN: 8 mg/dL (ref 8–23)
CO2: 22 mmol/L (ref 22–32)
Calcium: 9.3 mg/dL (ref 8.9–10.3)
Chloride: 108 mmol/L (ref 98–111)
Creatinine, Ser: 1.15 mg/dL — ABNORMAL HIGH (ref 0.44–1.00)
GFR, Estimated: 50 mL/min — ABNORMAL LOW (ref >60)
Glucose, Bld: 102 mg/dL — ABNORMAL HIGH (ref 70–99)
Potassium: 3.8 mmol/L (ref 3.5–5.1)
Sodium: 144 mmol/L (ref 135–145)
Total Bilirubin: 0.8 mg/dL (ref 0.0–1.2)
Total Protein: 6.3 g/dL — ABNORMAL LOW (ref 6.5–8.1)

## 2024-01-28 LAB — CBC WITH DIFFERENTIAL/PLATELET
Abs Immature Granulocytes: 0.74 K/uL — ABNORMAL HIGH (ref 0.00–0.07)
Basophils Absolute: 0.2 K/uL — ABNORMAL HIGH (ref 0.0–0.1)
Basophils Relative: 1 %
Eosinophils Absolute: 0 K/uL (ref 0.0–0.5)
Eosinophils Relative: 0 %
HCT: 26.5 % — ABNORMAL LOW (ref 36.0–46.0)
Hemoglobin: 8.8 g/dL — ABNORMAL LOW (ref 12.0–15.0)
Immature Granulocytes: 4 %
Lymphocytes Relative: 16 %
Lymphs Abs: 3 K/uL (ref 0.7–4.0)
MCH: 29.7 pg (ref 26.0–34.0)
MCHC: 33.2 g/dL (ref 30.0–36.0)
MCV: 89.5 fL (ref 80.0–100.0)
Monocytes Absolute: 1.4 K/uL — ABNORMAL HIGH (ref 0.1–1.0)
Monocytes Relative: 8 %
Neutro Abs: 13.3 K/uL — ABNORMAL HIGH (ref 1.7–7.7)
Neutrophils Relative %: 71 %
Platelets: 322 K/uL (ref 150–400)
RBC: 2.96 MIL/uL — ABNORMAL LOW (ref 3.87–5.11)
RDW: 17.5 % — ABNORMAL HIGH (ref 11.5–15.5)
WBC: 18.6 K/uL — ABNORMAL HIGH (ref 4.0–10.5)
nRBC: 0 % (ref 0.0–0.2)

## 2024-01-28 MED ORDER — CHLORHEXIDINE GLUCONATE CLOTH 2 % EX PADS
6.0000 | MEDICATED_PAD | Freq: Every day | CUTANEOUS | Status: DC
  Administered 2024-01-28 – 2024-02-03 (×7): 6 via TOPICAL

## 2024-01-28 MED ORDER — SODIUM CHLORIDE 0.9% FLUSH: 10.0000 mL | INTRAVENOUS | Status: DC | PRN

## 2024-01-28 MED ORDER — SODIUM CHLORIDE 0.9% FLUSH
10.0000 mL | Freq: Two times a day (BID) | INTRAVENOUS | Status: DC
  Administered 2024-01-28 – 2024-02-03 (×13): 10 mL

## 2024-01-28 NOTE — Progress Notes (Signed)
 PROGRESS NOTE    [name]  FMW:[id number] DOB: [birth year] DOA: 01/25/2024 PCP: Antonetta Rollene BRAVO, MD  Chief Complaint  Patient presents with   Abdominal Pain   Weakness    Hospital Course:  [name] is a 78 year old female with breast cancer on chemotherapy, hypertension, hyperlipidemia, GERD, diverticulosis, who presents with abdominal pain, nausea, diarrhea.  Patient's most recent chemotherapy was 01/15/2024.  She denies blood or mucus in her stool.  CT revealed sigmoid diverticulosis with wall thickening, mucosal hyperenhancement, adjacent fat stranding.  No lymphadenopathy or metastatic disease seen in abdomen pelvis.  Patient was started on Zosyn  and admitted.  Stay has been complicated by persistent leukocytosis and AKI.  Subjective: Patient is tolerating liquid diet without issue.  She is requesting to advance her diet.  She denies any abdominal pain currently.  She reports that it comes in waves and typically is associated with hot sensation to her abdomen but no true pain.  Denies any vomiting or Bms today   Objective: Vitals:   01/27/24 1914 01/27/24 2048 01/28/24 0444 01/28/24 0759  BP: (!) 150/70 (!) 148/75 131/82 131/65  Pulse:  (!) 106 (!) 103 (!) 107  Resp:  18 18 18   Temp:  99.2 F (37.3 C) 99.4 F (37.4 C) 98.3 F (36.8 C)  TempSrc:    Oral  SpO2:  100% 98% 100%  Weight:      Height:        Intake/Output Summary (Last 24 hours) at 01/28/2024 1338 Last data filed at 01/28/2024 1041 Gross per 24 hour  Intake 120 ml  Output --  Net 120 ml   Filed Weights   01/25/24 1520  Weight: 68 kg    Examination: General exam: Appears calm and comfortable, NAD  Respiratory system: No work of breathing, symmetric chest wall expansion Cardiovascular system: S1 & S2 heard, RRR.  Gastrointestinal system: Abdomen is nondistended, soft and nontender.  Neuro: Alert and oriented. No focal neurological deficits. Extremities: Symmetric, expected ROM Skin: No  rashes, lesions Psychiatry: Demonstrates appropriate judgement and insight. Mood & affect appropriate for situation.   Assessment & Plan:  Principal Problem:   Acute diverticulitis Active Problems:   Hyperlipidemia LDL goal <100   Essential hypertension   GERD    Acute diverticulitis without perforation or abscess - Leukocytosis is persistent and rising.  Remains afebrile without pain.  No diarrhea - Continue Zosyn  for now.  Consider repeat imaging tomorrow if leukocytosis persists - Have advanced diet  Rising leukocytosis Tachycardia - Patient is on home dose metoprolol .  Tachycardia has been persistent.  Rising leukocytosis despite no abdominal signs or symptoms.  Will obtain lower extremity Dopplers.  Patient is high risk for coagulopathy.  Hypertension - Struggled with hypotension earlier this admission.  Since resolved. - Continue with losartan  and metoprolol  for now.  Discontinue HCTZ in light of AKI - Follow closely and titrate daily  Hyperlipidemia - Resume statin at discharge  GERD - Continue PPI  AKI - Creatinine rising today.  Status post fluid bolus earlier this admission.  BUN 8.  Do not suspect dehydration currently -- Hold HCTZ - Continue to monitor creatinine closely, encourage p.o. intake - Creatinine clearance 36.4  Normocytic anemia - Iron studies ordered - Hemoglobin remained stable, continue to trend CBC - No acute bleeding  Breast cancer, IDC - Currently on chemotherapy which is complicating the above presentation  DVT prophylaxis: lovenox    Code Status: Full Code Disposition:  Pending resolution in WBC and AKI  Consultants:    Procedures:    Antimicrobials:  Anti-infectives (From admission, onward)    Start     Dose/Rate Route Frequency Ordered Stop   01/25/24 2200  piperacillin -tazobactam (ZOSYN ) IVPB 3.375 g        3.375 g 12.5 mL/hr over 4 Hours Intravenous Every 8 hours 01/25/24 1835     01/25/24 1745  piperacillin -tazobactam  (ZOSYN ) IVPB 3.375 g        3.375 g 100 mL/hr over 30 Minutes Intravenous  Once 01/25/24 1720 01/25/24 1825       Data Reviewed: I have personally reviewed following labs and imaging studies CBC: Recent Labs  Lab 01/25/24 1518 01/26/24 0420 01/27/24 1047 01/28/24 0825  WBC 15.3* 13.3* 15.4* 18.6*  NEUTROABS  --   --  11.4* 13.3*  HGB 8.8* 7.8* 8.4* 8.8*  HCT 27.0* 24.0* 25.7* 26.5*  MCV 90.6 89.6 88.3 89.5  PLT 268 252 311 322   Basic Metabolic Panel: Recent Labs  Lab 01/25/24 1518 01/26/24 0420 01/27/24 1047 01/28/24 0825  NA 137 136 142 144  K 4.7 3.9 4.0 3.8  CL 100 104 111 108  CO2 24 22 21* 22  GLUCOSE 132* 125* 137* 102*  BUN 19 14 8 8   CREATININE 1.36* 0.97 0.90 1.15*  CALCIUM  9.7 8.7* 8.9 9.3   GFR: Estimated Creatinine Clearance: 36.4 mL/min (A) (by C-G formula based on SCr of 1.15 mg/dL (H)). Liver Function Tests: Recent Labs  Lab 01/25/24 1518 01/26/24 0420 01/27/24 1047 01/28/24 0825  AST 65* 39 35 34  ALT 48* 36 29 28  ALKPHOS 160* 123 128* 119  BILITOT 0.5 0.6 1.1 0.8  PROT 7.7 6.1* 6.0* 6.3*  ALBUMIN 3.1* 2.6* 2.5* 2.8*   CBG: No results for input(s): GLUCAP in the last 168 hours.  No results found for this or any previous visit (from the past 240 hours).   Radiology Studies: No results found.  Scheduled Meds:  enoxaparin  (LOVENOX ) injection  40 mg Subcutaneous Q24H   feeding supplement  1 Container Oral TID BM   gabapentin   100 mg Oral QHS   losartan   100 mg Oral Daily   And   hydrochlorothiazide   25 mg Oral Daily   metoprolol  succinate  50 mg Oral Daily   Continuous Infusions:  piperacillin -tazobactam 3.375 g (01/28/24 0605)     LOS: 3 days  MDM: Patient is high risk for one or more organ failure.  They necessitate ongoing hospitalization for continued IV therapies and subsequent lab monitoring. Total time spent interpreting labs and vitals, reviewing the medical record, coordinating care amongst consultants and care team  members, directly assessing and discussing care with the patient and/or family: 55 min  Mallory Swopes, DO [facility]  To contact the attending physician between 7A-7P please use Epic Chat. To contact the covering physician during after hours 7P-7A, please review Amion.  01/28/2024, 1:38 PM   *This document has been created with the assistance of dictation software. Please excuse typographical errors. *

## 2024-01-28 NOTE — Progress Notes (Signed)
 Occupational Therapy Treatment Patient Details Name: [name] MRN: [id number] DOB: 09-Nov-1945 Today's Date: 01/28/2024   History of present illness Jeffery Gammell is a 77yoF who [facility] c belly pain- reports decreased PO, annorexia since last chemo on 6/26. PMH: BrCA on chemotherapy, HTN, HLD, sleep apnea, diverticulosis of colon, chronic back pain. In ED BP 89/37mmHg. Pt admitted with diagnosis of acute diverticulitis.   OT comments  Pt seen for OT treatment on this date. Upon arrival to room pt seated in recliner chair, agreeable to tx. Pt requires CGA with cuing for hand/foot placement for sit to stand transfers and stood for +6 minutes to complete grooming/oral care at RW level with SBA overall. Therapist directed patient through BUEs strengthening and balance tasks at RW level with light reaching outside of BOS in order to build functional strength required for standing components of ADLs and reducing risk of falls.  Pt making progress toward goals, will continue to follow POC.  At end of session, patient seated in recliner chair with BLEs elevated and call button within reach.        If plan is discharge home, recommend the following:  A little help with walking and/or transfers;A little help with bathing/dressing/bathroom;Assistance with cooking/housework;Assist for transportation   Equipment Recommendations       Recommendations for Other Services      Precautions / Restrictions Precautions Precautions: Fall Recall of Precautions/Restrictions: Intact Restrictions Weight Bearing Restrictions Per Provider Order: No       Mobility Bed Mobility                    Transfers Overall transfer level: Needs assistance Equipment used: Rolling walker (2 wheels) Transfers: Sit to/from Stand Sit to Stand: Contact guard assist           General transfer comment: education on safety awareness     Balance Overall balance assessment: Needs assistance    Sitting balance-Leahy Scale: Good     Standing balance support: During functional activity, Single extremity supported Standing balance-Leahy Scale: Fair                             ADL either performed or assessed with clinical judgement   ADL Overall ADL's : Needs assistance/impaired     Grooming: Wash/dry face;Oral care;Supervision/safety Grooming Details (indicate cue type and reason): standing at RW for +6 minutes                                    Extremity/Trunk Assessment Upper Extremity Assessment Upper Extremity Assessment: Generalized weakness            Vision       Perception     Praxis     Communication     Cognition Arousal: Alert Behavior During Therapy: WFL for tasks assessed/performed                                          Cueing      Exercises General Exercises - Upper Extremity Shoulder Flexion: AROM, 20 reps, Standing Elbow Flexion: AROM, 20 reps, Standing Other Exercises Other Exercises: Education on safety with functional transfers during standing components of ADLs    Shoulder Instructions       General  Comments      Pertinent Vitals/ Pain       Pain Assessment Pain Assessment: No/denies pain  Home Living                                          Prior Functioning/Environment              Frequency  Min 2X/week        Progress Toward Goals  OT Goals(current goals can now be found in the care plan section)  Progress towards OT goals: Progressing toward goals     Plan      Co-evaluation                 AM-PAC OT 6 Clicks Daily Activity     Outcome Measure   Help from another person eating meals?: None Help from another person taking care of personal grooming?: None Help from another person toileting, which includes using toliet, bedpan, or urinal?: A Little Help from another person bathing (including washing, rinsing, drying)?: A  Little Help from another person to put on and taking off regular upper body clothing?: None Help from another person to put on and taking off regular lower body clothing?: A Little 6 Click Score: 21    End of Session Equipment Utilized During Treatment: Gait belt;Rolling walker (2 wheels)  OT Visit Diagnosis: Unsteadiness on feet (R26.81);Muscle weakness (generalized) (M62.81)   Activity Tolerance Patient tolerated treatment well   Patient Left in chair;with call bell/phone within reach   Nurse Communication          Time: 8857-8798 OT Time Calculation (min): 19 min  Charges: OT General Charges $OT Visit: 1 Visit  Harlene Sharps OTR/L   Harlene LITTIE Sharps 01/28/2024, 12:12 PM

## 2024-01-29 DIAGNOSIS — K5792 Diverticulitis of intestine, part unspecified, without perforation or abscess without bleeding: Secondary | ICD-10-CM | POA: Diagnosis not present

## 2024-01-29 DIAGNOSIS — K219 Gastro-esophageal reflux disease without esophagitis: Secondary | ICD-10-CM | POA: Diagnosis not present

## 2024-01-29 DIAGNOSIS — I10 Essential (primary) hypertension: Secondary | ICD-10-CM | POA: Diagnosis not present

## 2024-01-29 DIAGNOSIS — E785 Hyperlipidemia, unspecified: Secondary | ICD-10-CM | POA: Diagnosis not present

## 2024-01-29 LAB — COMPREHENSIVE METABOLIC PANEL WITH GFR
ALT: 24 U/L (ref 0–44)
AST: 30 U/L (ref 15–41)
Albumin: 2.7 g/dL — ABNORMAL LOW (ref 3.5–5.0)
Alkaline Phosphatase: 108 U/L (ref 38–126)
Anion gap: 13 (ref 5–15)
BUN: 8 mg/dL (ref 8–23)
CO2: 22 mmol/L (ref 22–32)
Calcium: 8.9 mg/dL (ref 8.9–10.3)
Chloride: 106 mmol/L (ref 98–111)
Creatinine, Ser: 1.07 mg/dL — ABNORMAL HIGH (ref 0.44–1.00)
GFR, Estimated: 54 mL/min — ABNORMAL LOW (ref >60)
Glucose, Bld: 109 mg/dL — ABNORMAL HIGH (ref 70–99)
Potassium: 3.4 mmol/L — ABNORMAL LOW (ref 3.5–5.1)
Sodium: 141 mmol/L (ref 135–145)
Total Bilirubin: 0.6 mg/dL (ref 0.0–1.2)
Total Protein: 6.4 g/dL — ABNORMAL LOW (ref 6.5–8.1)

## 2024-01-29 LAB — CBC WITH DIFFERENTIAL/PLATELET
Abs Immature Granulocytes: 0.76 K/uL — ABNORMAL HIGH (ref 0.00–0.07)
Basophils Absolute: 0.1 K/uL (ref 0.0–0.1)
Basophils Relative: 1 %
Eosinophils Absolute: 0 K/uL (ref 0.0–0.5)
Eosinophils Relative: 0 %
HCT: 24.6 % — ABNORMAL LOW (ref 36.0–46.0)
Hemoglobin: 8 g/dL — ABNORMAL LOW (ref 12.0–15.0)
Immature Granulocytes: 5 %
Lymphocytes Relative: 18 %
Lymphs Abs: 2.8 K/uL (ref 0.7–4.0)
MCH: 28.7 pg (ref 26.0–34.0)
MCHC: 32.5 g/dL (ref 30.0–36.0)
MCV: 88.2 fL (ref 80.0–100.0)
Monocytes Absolute: 1.2 K/uL — ABNORMAL HIGH (ref 0.1–1.0)
Monocytes Relative: 8 %
Neutro Abs: 10.6 K/uL — ABNORMAL HIGH (ref 1.7–7.7)
Neutrophils Relative %: 68 %
Platelets: 354 K/uL (ref 150–400)
RBC: 2.79 MIL/uL — ABNORMAL LOW (ref 3.87–5.11)
RDW: 17.3 % — ABNORMAL HIGH (ref 11.5–15.5)
WBC: 15.5 K/uL — ABNORMAL HIGH (ref 4.0–10.5)
nRBC: 0.1 % (ref 0.0–0.2)

## 2024-01-29 LAB — PHOSPHORUS: Phosphorus: 4.1 mg/dL (ref 2.5–4.6)

## 2024-01-29 LAB — MAGNESIUM: Magnesium: 1.5 mg/dL — ABNORMAL LOW (ref 1.7–2.4)

## 2024-01-29 LAB — RETICULOCYTES
Immature Retic Fract: 15.3 % (ref 2.3–15.9)
RBC.: 2.81 MIL/uL — ABNORMAL LOW (ref 3.87–5.11)
Retic Count, Absolute: 23.9 K/uL (ref 19.0–186.0)
Retic Ct Pct: 0.9 % (ref 0.4–3.1)

## 2024-01-29 LAB — FERRITIN: Ferritin: 582 ng/mL — ABNORMAL HIGH (ref 11–307)

## 2024-01-29 LAB — IRON AND TIBC
Iron: 80 ug/dL (ref 28–170)
Saturation Ratios: 43 % — ABNORMAL HIGH (ref 10.4–31.8)
TIBC: 185 ug/dL — ABNORMAL LOW (ref 250–450)
UIBC: 105 ug/dL

## 2024-01-29 LAB — FOLATE: Folate: 18.8 ng/mL (ref >5.9)

## 2024-01-29 MED ORDER — ENSURE PLUS HIGH PROTEIN PO LIQD
237.0000 mL | Freq: Two times a day (BID) | ORAL | Status: DC
  Administered 2024-01-29 – 2024-02-03 (×10): 237 mL via ORAL

## 2024-01-29 MED ORDER — AMOXICILLIN-POT CLAVULANATE 875-125 MG PO TABS
1.0000 | ORAL_TABLET | Freq: Two times a day (BID) | ORAL | Status: DC
  Administered 2024-01-29 – 2024-01-30 (×3): 1 via ORAL
  Filled 2024-01-29 (×3): qty 1

## 2024-01-29 MED ORDER — POTASSIUM CHLORIDE CRYS ER 20 MEQ PO TBCR
40.0000 meq | EXTENDED_RELEASE_TABLET | Freq: Once | ORAL | Status: AC
  Administered 2024-01-29: 40 meq via ORAL
  Filled 2024-01-29: qty 2

## 2024-01-29 NOTE — Progress Notes (Signed)
 Mobility Specialist - Progress Note   01/29/24 1524  Mobility  Activity Ambulated with assistance to bathroom  Level of Assistance Standby assist, set-up cues, supervision of patient - no hands on  Assistive Device Cane  Distance Ambulated (ft) 24 ft  Activity Response Tolerated well  Mobility visit 1 Mobility  Mobility Specialist Start Time (ACUTE ONLY) 1452  Mobility Specialist Stop Time (ACUTE ONLY) 1459  Mobility Specialist Time Calculation (min) (ACUTE ONLY) 7 min   Pt sitting in the recliner upon entry, utilizing RA. Pt STS from recliner MinG and amb to the bathroom sup--- furniture cruising noted during amb. Pt STS from commode w/ sup and returned to the recliner. Pt expressed SOB upon return to the recliner, O2 WNL. Pt left seated with alarm set and needs within reach.   America Silvan Mobility Specialist 01/29/24 3:29 PM

## 2024-01-29 NOTE — Progress Notes (Signed)
 Occupational Therapy Treatment Patient Details Name: [name] MRN: [id number] DOB: February 01, 1946 Today's Date: 01/29/2024   History of present illness [name] is a 77yoF who comes to [facility] 01/25/24 c belly pain- reports decreased PO, annorexia since last chemo on 6/26. PMH: BrCA on chemotherapy, HTN, HLD, sleep apnea, diverticulosis of colon, chronic back pain. In ED BP 89/89mmHg. Pt admitted with diagnosis of acute diverticulitis.   OT comments  Pt seen for OT treatment on this date. Upon arrival to room pt supine in bed requesting use bathroom, agreeable to tx. Pt requires Mod I for bed mobility, CGA/SBA for sit to stand at RW and CGA/SBA for mobility at RW level to bathroom.  Therapist set-up 3:1 over regular toilet and patient transferred with SBA.  Completed toileting hygiene and basic clothing management with SBA overall with F+ standing balance.  Patient fatigued following toileting tasks and returned to recliner with BLEs elevated, call button and phone within close reach.  RN notified via secure chat of patient wiping and red smears on toilet paper. Pt making good progress toward goals, will continue to follow POC. Discharge recommendation remains appropriate.        If plan is discharge home, recommend the following:      Equipment Recommendations       Recommendations for Other Services      Precautions / Restrictions Precautions Precautions: Fall       Mobility Bed Mobility Overal bed mobility: Modified Independent                  Transfers Overall transfer level: Needs assistance Equipment used: Rolling walker (2 wheels) Transfers: Sit to/from Stand Sit to Stand: Contact guard assist           General transfer comment: education on safety awareness     Balance Overall balance assessment: Needs assistance   Sitting balance-Leahy Scale: Good     Standing balance support: During functional activity, Single extremity supported Standing  balance-Leahy Scale: Fair                             ADL either performed or assessed with clinical judgement   ADL Overall ADL's : Needs assistance/impaired                         Toilet Transfer: Supervision/safety;Rolling walker (2 wheels);Grab bars;Comfort height toilet;BSC/3in1 Toilet Transfer Details (indicate cue type and reason): 3:1 commode seat over regular toilet, used toilet rail and grab bar for transfer Toileting- Clothing Manipulation and Hygiene: Contact guard assist       Functional mobility during ADLs: Contact guard assist General ADL Comments: CGA/SBA with RW    Extremity/Trunk Assessment Upper Extremity Assessment Upper Extremity Assessment: Generalized weakness            Vision Baseline Vision/History: 0 No visual deficits     Perception     Praxis     Communication     Cognition Arousal: Alert Behavior During Therapy: WFL for tasks assessed/performed                                          Cueing      Exercises Other Exercises Other Exercises: Education on safety with functional transfers during standing components of ADLs    Shoulder Instructions  General Comments      Pertinent Vitals/ Pain       Pain Assessment Pain Assessment: No/denies pain  Home Living                                          Prior Functioning/Environment              Frequency  Min 2X/week        Progress Toward Goals  OT Goals(current goals can now be found in the care plan section)  Progress towards OT goals: Progressing toward goals     Plan      Co-evaluation                 AM-PAC OT 6 Clicks Daily Activity     Outcome Measure                    End of Session Equipment Utilized During Treatment: Gait belt;Rolling walker (2 wheels)  OT Visit Diagnosis: Unsteadiness on feet (R26.81);Muscle weakness (generalized) (M62.81)   Activity Tolerance  Patient tolerated treatment well   Patient Left in chair;with call bell/phone within reach   Nurse Communication          Time: 8879-8850 OT Time Calculation (min): 29 min  Charges: OT General Charges $OT Visit: 1 Visit  Harlene Sharps OTR/L   Harlene LITTIE Sharps 01/29/2024, 11:58 AM

## 2024-01-29 NOTE — Plan of Care (Signed)
  Problem: Education: Goal: Knowledge of General Education information will improve Description: Including pain rating scale, medication(s)/side effects and non-pharmacologic comfort measures Outcome: Progressing   Problem: Health Behavior/Discharge Planning: Goal: Ability to manage health-related needs will improve Outcome: Progressing   Problem: Clinical Measurements: Goal: Ability to maintain clinical measurements within normal limits will improve Outcome: Progressing   Problem: Activity: Goal: Risk for activity intolerance will decrease Outcome: Progressing   Problem: Nutrition: Goal: Adequate nutrition will be maintained Outcome: Progressing   Problem: Elimination: Goal: Will not experience complications related to bowel motility Outcome: Progressing   Problem: Pain Managment: Goal: General experience of comfort will improve and/or be controlled Outcome: Progressing   Problem: Safety: Goal: Ability to remain free from injury will improve Outcome: Progressing   Problem: Skin Integrity: Goal: Risk for impaired skin integrity will decrease Outcome: Progressing

## 2024-01-29 NOTE — Plan of Care (Signed)
  Problem: Education: Goal: Knowledge of General Education information will improve Description: Including pain rating scale, medication(s)/side effects and non-pharmacologic comfort measures 01/29/2024 1155 by Kateri Caprice HERO, RN Outcome: Progressing 01/29/2024 0810 by Kateri Caprice HERO, RN Outcome: Progressing   Problem: Health Behavior/Discharge Planning: Goal: Ability to manage health-related needs will improve 01/29/2024 1155 by Kateri Caprice HERO, RN Outcome: Progressing 01/29/2024 0810 by Kateri Caprice HERO, RN Outcome: Progressing   Problem: Clinical Measurements: Goal: Ability to maintain clinical measurements within normal limits will improve 01/29/2024 1155 by Kateri Caprice HERO, RN Outcome: Progressing 01/29/2024 0810 by Kateri Caprice HERO, RN Outcome: Progressing   Problem: Activity: Goal: Risk for activity intolerance will decrease 01/29/2024 1155 by Kateri Caprice HERO, RN Outcome: Progressing 01/29/2024 0810 by Kateri Caprice HERO, RN Outcome: Progressing   Problem: Nutrition: Goal: Adequate nutrition will be maintained 01/29/2024 1155 by Kateri Caprice HERO, RN Outcome: Progressing 01/29/2024 0810 by Kateri Caprice HERO, RN Outcome: Progressing   Problem: Coping: Goal: Level of anxiety will decrease Outcome: Progressing   Problem: Elimination: Goal: Will not experience complications related to bowel motility 01/29/2024 1155 by Kateri Caprice HERO, RN Outcome: Progressing 01/29/2024 0810 by Kateri Caprice HERO, RN Outcome: Progressing   Problem: Pain Managment: Goal: General experience of comfort will improve and/or be controlled 01/29/2024 1155 by Kateri Caprice HERO, RN Outcome: Progressing 01/29/2024 0810 by Kateri Caprice HERO, RN Outcome: Progressing   Problem: Safety: Goal: Ability to remain free from injury will improve 01/29/2024 1155 by Kateri Caprice HERO, RN Outcome: Progressing 01/29/2024 0810 by Kateri Caprice HERO, RN Outcome: Progressing   Problem: Skin Integrity: Goal: Risk for impaired skin integrity will  decrease 01/29/2024 1155 by Kateri Caprice HERO, RN Outcome: Progressing 01/29/2024 0810 by Kateri Caprice HERO, RN Outcome: Progressing

## 2024-01-29 NOTE — Progress Notes (Signed)
 PROGRESS NOTE    [name]  FMW:[id number] DOB: Jul 04, 1946 DOA: 01/25/2024 PCP: Antonetta Rollene BRAVO, MD  Chief Complaint  Patient presents with   Abdominal Pain   Weakness    Hospital Course:  [name] is a 78 year old female with breast cancer on chemotherapy, hypertension, hyperlipidemia, GERD, diverticulosis, who presents with abdominal pain, nausea, diarrhea.  Patient's most recent chemotherapy was 01/15/2024.  She denies blood or mucus in her stool.  CT revealed sigmoid diverticulosis with wall thickening, mucosal hyperenhancement, adjacent fat stranding.  No lymphadenopathy or metastatic disease seen in abdomen pelvis.  Patient was started on Zosyn  and admitted.  Stay has been complicated by persistent leukocytosis and AKI.  Subjective: Advanced patient to a regular diet this morning.  She reports that she is tolerating this well.  No abdominal pain.  No nausea, no vomiting.   Objective: Vitals:   01/29/24 0400 01/29/24 0557 01/29/24 0558 01/29/24 0808  BP: (!) 141/72 (!) 159/71 (!) 159/71 137/73  Pulse: 76 95 95 (!) 104  Resp: 16   18  Temp: 98 F (36.7 C) 97.8 F (36.6 C) 97.8 F (36.6 C) 98.3 F (36.8 C)  TempSrc: Oral     SpO2: 100% 98% 100% 97%  Weight:      Height:        Intake/Output Summary (Last 24 hours) at 01/29/2024 1336 Last data filed at 01/29/2024 1043 Gross per 24 hour  Intake 240 ml  Output --  Net 240 ml   Filed Weights   01/25/24 1520  Weight: 68 kg    Examination: General exam: Appears calm and comfortable, NAD  Respiratory system: No work of breathing, symmetric chest wall expansion Cardiovascular system: S1 & S2 heard, RRR.  Gastrointestinal system: Abdomen is nondistended, soft and nontender.  Neuro: Alert and oriented. No focal neurological deficits. Extremities: Symmetric, expected ROM Skin: No rashes, lesions Psychiatry: Demonstrates appropriate judgement and insight. Mood & affect appropriate for situation.    Assessment & Plan:  Principal Problem:   Acute diverticulitis Active Problems:   Hyperlipidemia LDL goal <100   Essential hypertension   GERD    Acute diverticulitis without perforation or abscess - Remains afebrile without pain.  No diarrhea.  Leukocytosis downtrending now - Discontinue Zosyn , and transition to Augmentin . - Repeat imaging if leukocytosis recurs. - Have advance to regular diet, patient is currently tolerating well.  Rising leukocytosis Tachycardia - Tachycardia and leukocytosis improving now.  On home dose metoprolol  - Lower extremity Dopplers 7/9 negative for DVT   Hypertension - Struggled with hypotension earlier this admission.  Resolved now. - Resume losartan  and metoprolol .  Hold HCTZ in setting of AKI - Continue to follow blood pressures closely, titrate daily as needed  Hyperlipidemia - Resume statin at discharge  GERD - Continue PPI  AKI - Baseline creatinine 0.90, has risen to 1.15.  Downtrending now - Continue to hold HCTZ - Monitor creatinine closely, encourage p.o. 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  Referred for SNF but family has declined.  Home health orders have been placed.  TOC aware.  Consultants:    Procedures:    Antimicrobials:  Anti-infectives (From admission, onward)    Start     Dose/Rate Route Frequency Ordered Stop   01/29/24 1300  amoxicillin -clavulanate (AUGMENTIN ) 875-125 MG per tablet 1 tablet        1 tablet Oral Every 12 hours 01/29/24 1208 02/02/24 0959    01/25/24 2200  piperacillin -tazobactam (ZOSYN ) IVPB 3.375 g  Status:  Discontinued        3.375 g 12.5 mL/hr over 4 Hours Intravenous Every 8 hours 01/25/24 1835 01/29/24 1208   01/25/24 1745  piperacillin -tazobactam (ZOSYN ) IVPB 3.375 g        3.375 g 100 mL/hr over 30 Minutes Intravenous  Once 01/25/24 1720 01/25/24 1825       Data Reviewed: I have personally reviewed following labs and imaging studies CBC: Recent Labs  Lab 01/25/24 1518 01/26/24 0420 01/27/24 1047 01/28/24 0825 01/29/24 0452  WBC 15.3* 13.3* 15.4* 18.6* 15.5*  NEUTROABS  --   --  11.4* 13.3* 10.6*  HGB 8.8* 7.8* 8.4* 8.8* 8.0*  HCT 27.0* 24.0* 25.7* 26.5* 24.6*  MCV 90.6 89.6 88.3 89.5 88.2  PLT 268 252 311 322 354   Basic Metabolic Panel: Recent Labs  Lab 01/25/24 1518 01/26/24 0420 01/27/24 1047 01/28/24 0825 01/29/24 0452  NA 137 136 142 144 141  K 4.7 3.9 4.0 3.8 3.4*  CL 100 104 111 108 106  CO2 24 22 21* 22 22  GLUCOSE 132* 125* 137* 102* 109*  BUN 19 14 8 8 8   CREATININE 1.36* 0.97 0.90 1.15* 1.07*  CALCIUM  9.7 8.7* 8.9 9.3 8.9  MG  --   --   --   --  1.5*  PHOS  --   --   --   --  4.1   GFR: Estimated Creatinine Clearance: 39.1 mL/min (A) (by C-G formula based on SCr of 1.07 mg/dL (H)). Liver Function Tests: Recent Labs  Lab 01/25/24 1518 01/26/24 0420 01/27/24 1047 01/28/24 0825 01/29/24 0452  AST 65* 39 35 34 30  ALT 48* 36 29 28 24   ALKPHOS 160* 123 128* 119 108  BILITOT 0.5 0.6 1.1 0.8 0.6  PROT 7.7 6.1* 6.0* 6.3* 6.4*  ALBUMIN 3.1* 2.6* 2.5* 2.8* 2.7*   CBG: No results for input(s): GLUCAP in the last 168 hours.  No results found for this or any previous visit (from the past 240 hours).   Radiology Studies: US  Venous Img Lower Bilateral (DVT) Result Date: 01/28/2024 CLINICAL DATA:  Tachycardia.  BILATERAL lower extremity edema. EXAM: Bilateral Lower Extremity Venous Doppler Ultrasound TECHNIQUE: Gray-scale sonography with compression, as well as color and duplex  ultrasound, were performed to evaluate the deep venous system(s) from the level of the common femoral vein through the popliteal and proximal calf veins. COMPARISON:  None available FINDINGS: VENOUS Normal compressibility of the common femoral, superficial femoral, and popliteal veins, as well as the visualized calf veins. Visualized portions of profunda femoral vein and great saphenous vein unremarkable. No filling defects to suggest DVT on grayscale or color Doppler imaging. Doppler waveforms show normal direction of venous flow, normal respiratory plasticity and response to augmentation. OTHER None. Limitations: Limited visualization of the RIGHT peroneal vein. IMPRESSION: No lower extremity DVT. Electronically Signed   By: Aliene Lloyd M.D.   On: 01/28/2024 16:23    Scheduled Meds:  amoxicillin -clavulanate  1 tablet Oral Q12H   Chlorhexidine  Gluconate Cloth  6 each Topical Daily   enoxaparin  (LOVENOX ) injection  40 mg Subcutaneous Q24H   feeding supplement  237 mL Oral BID BM   gabapentin   100 mg Oral QHS   losartan   100 mg Oral Daily   metoprolol  succinate  50 mg Oral Daily   sodium chloride  flush  10-40 mL Intracatheter Q12H   Continuous Infusions:     LOS: 4 days  MDM: Patient is high risk for one or more organ failure.  They necessitate ongoing hospitalization for continued IV therapies and subsequent lab monitoring. Total time spent interpreting labs and vitals, reviewing the medical record, coordinating care amongst consultants and care team members, directly assessing and discussing care with the patient and/or family: 55 min  Mallory Behlke, DO [facility]  To contact the attending physician between 7A-7P please use Epic Chat. To contact the covering physician during after hours 7P-7A, please review Amion.  01/29/2024, 1:36 PM   *This document has been created with the assistance of dictation software. Please excuse typographical errors. *

## 2024-01-29 NOTE — Plan of Care (Signed)
 The patient continues on IV piperacillin -tazobactam 3.375 g for diverticulitis w/o s/s of adverse effects noted. Port a cath to mid chest is intact and patent and flushes w/o difficulty, dressing is dry and intact. No s/s of acute distress noted.

## 2024-01-29 NOTE — Progress Notes (Signed)
 Physical Therapy Treatment Patient Details Name: [name] MRN: [id number] DOB: [birth year] Today's Date: 01/29/2024   History of Present Illness [name] is a 77yoF who comes [facility] 01/25/24 c belly pain- reports decreased PO, annorexia since last chemo on 6/26. PMH: BrCA on chemotherapy, HTN, HLD, sleep apnea, diverticulosis of colon, chronic back pain. In ED BP 89/69mmHg. Pt admitted with diagnosis of acute diverticulitis.    PT Comments  Pt seated in recliner upon entry, very happy to receive services and motivated to get better. PT focused today's session on increasing ambulation distance and BLE strengthening . During session pt was able to performed 3x trials STS with SPC and CGA/SBA (1x trials STS from recliner prior to ambulation, 1x trial STS from recliner following short seated break, and 1x trial from EOB to recliner to finish treatment). Pt was also able to ambulate 90 ft from recliner to hallway with SPC and CGA/SBA and 100 ft with SPC to EOB, prior to end of session. Pt required a short seated break between bout of activity to check vitals and recover, but has made improvements from initial evaluation in both duration and distance (of ambulation). Pt would benefit from skilled PT to continue to work towards PT goals and return to PLOF.     If plan is discharge home, recommend the following: Assistance with cooking/housework;Help with stairs or ramp for entrance;Assist for transportation;Direct supervision/assist for medications management   Can travel by private vehicle     Yes  Equipment Recommendations  None recommended by PT    Recommendations for Other Services       Precautions / Restrictions Precautions Precautions: Fall Recall of Precautions/Restrictions: Intact Restrictions Weight Bearing Restrictions Per Provider Order: No     Mobility  Bed Mobility              General bed mobility comments: seated in recliner chair at start/end of session     Transfers Overall transfer level: Needs assistance Equipment used: Rolling walker (2 wheels) Transfers: Sit to/from Stand Sit to Stand: Contact guard assist, Supervision   Step pivot transfers: Supervision       General transfer comment: Pt performed 3x trials STS (1x trials STS from recliner prior to ambulation, 1x trial STS from recliner following short seated break, and 1x trial from EOB to recliner to finish treatment). Ambulation/Gait Ambulation/Gait assistance: Contact guard assist, Supervision   Assistive device: Straight cane Gait Pattern/deviations: Step-to pattern Gait velocity: decreased     General Gait Details: Slow and steady pace with no overt LOB. Pt was able to ambulate 70ft from recliner to hallway with SPC and CGA/SBA and required a short seated rest break before continued ambulation back to room. ( 100 ft with SPCto EOB, prior to end of session.)   Stairs             Wheelchair Mobility     Tilt Bed    Modified Rankin (Stroke Patients Only)       Balance Overall balance assessment: Needs assistance Sitting-balance support: Feet supported Sitting balance-Leahy Scale: Good Sitting balance - Comments: Steady static seated balance reaching within BOS   Standing balance support: During functional activity, Single extremity supported Standing balance-Leahy Scale: Good Standing balance comment: Steady dynamic balance                            Communication Communication Communication: No apparent difficulties  Cognition Arousal: Alert Behavior During Therapy: [facility]  for tasks assessed/performed                             Following commands: Intact      Cueing Cueing Techniques: Verbal cues  Exercises General Exercises - Lower Extremity Long Arc Quad: AROM, Both, 10 reps, Seated (min resistance at distal tibia)    General Comments General comments (skin integrity, edema, etc.): Pre session: HR: 100 bpm, SpO2: 99%  on room air, With 90 ft of ambulation: HR: 117 bpm, SpO2: 98% on room, With 100 ft of ambulation: HR: 121 bpm, SpO2: 98% on room air, air,  Post session: HR: 108 bpm, SpO2: 99% on room air.      Pertinent Vitals/Pain Pain Assessment Pain Assessment: No/denies pain    Home Living                          Prior Function            PT Goals (current goals can now be found in the care plan section) Acute Rehab PT Goals Patient Stated Goal: be more independent with IADL, self care PT Goal Formulation: With patient Time For Goal Achievement: 02/10/24 Potential to Achieve Goals: Good Progress towards PT goals: Progressing toward goals    Frequency    Min 2X/week      PT Plan      Co-evaluation              AM-PAC PT 6 Clicks Mobility   Outcome Measure  Help needed turning from your back to your side while in a flat bed without using bedrails?: A Little Help needed moving from lying on your back to sitting on the side of a flat bed without using bedrails?: A Little Help needed moving to and from a bed to a chair (including a wheelchair)?: A Little Help needed standing up from a chair using your arms (e.g., wheelchair or bedside chair)?: A Little Help needed to walk in hospital room?: A Little Help needed climbing 3-5 steps with a railing? : A Little 6 Click Score: 18    End of Session Equipment Utilized During Treatment: Gait belt Activity Tolerance: Patient tolerated treatment well;No increased pain Patient left: in chair;with call bell/phone within reach;with chair alarm set Nurse Communication: Mobility status;Other (comment) (BP during session) PT Visit Diagnosis: Other abnormalities of gait and mobility (R26.89);Unsteadiness on feet (R26.81);Difficulty in walking, not elsewhere classified (R26.2)     Time: 8574-8554 PT Time Calculation (min) (ACUTE ONLY): 20 min  Charges:                           Gesselle Fitzsimons, SPT 01/29/24, 4:12  PM

## 2024-01-30 ENCOUNTER — Inpatient Hospital Stay

## 2024-01-30 DIAGNOSIS — K219 Gastro-esophageal reflux disease without esophagitis: Secondary | ICD-10-CM | POA: Diagnosis not present

## 2024-01-30 DIAGNOSIS — K5792 Diverticulitis of intestine, part unspecified, without perforation or abscess without bleeding: Secondary | ICD-10-CM | POA: Diagnosis not present

## 2024-01-30 DIAGNOSIS — I10 Essential (primary) hypertension: Secondary | ICD-10-CM | POA: Diagnosis not present

## 2024-01-30 DIAGNOSIS — E785 Hyperlipidemia, unspecified: Secondary | ICD-10-CM | POA: Diagnosis not present

## 2024-01-30 LAB — COMPREHENSIVE METABOLIC PANEL WITH GFR
ALT: 24 U/L (ref 0–44)
AST: 29 U/L (ref 15–41)
Albumin: 2.9 g/dL — ABNORMAL LOW (ref 3.5–5.0)
Alkaline Phosphatase: 105 U/L (ref 38–126)
Anion gap: 12 (ref 5–15)
BUN: 9 mg/dL (ref 8–23)
CO2: 23 mmol/L (ref 22–32)
Calcium: 9 mg/dL (ref 8.9–10.3)
Chloride: 108 mmol/L (ref 98–111)
Creatinine, Ser: 0.63 mg/dL (ref 0.44–1.00)
GFR, Estimated: >60 mL/min (ref >60)
Glucose, Bld: 114 mg/dL — ABNORMAL HIGH (ref 70–99)
Potassium: 3.6 mmol/L (ref 3.5–5.1)
Sodium: 143 mmol/L (ref 135–145)
Total Bilirubin: 0.5 mg/dL (ref 0.0–1.2)
Total Protein: 6.6 g/dL (ref 6.5–8.1)

## 2024-01-30 LAB — CBC WITH DIFFERENTIAL/PLATELET
Abs Immature Granulocytes: 0.94 K/uL — ABNORMAL HIGH (ref 0.00–0.07)
Basophils Absolute: 0.1 K/uL (ref 0.0–0.1)
Basophils Relative: 1 %
Eosinophils Absolute: 0 K/uL (ref 0.0–0.5)
Eosinophils Relative: 0 %
HCT: 25.1 % — ABNORMAL LOW (ref 36.0–46.0)
Hemoglobin: 8.3 g/dL — ABNORMAL LOW (ref 12.0–15.0)
Immature Granulocytes: 4 %
Lymphocytes Relative: 13 %
Lymphs Abs: 2.9 K/uL (ref 0.7–4.0)
MCH: 29.4 pg (ref 26.0–34.0)
MCHC: 33.1 g/dL (ref 30.0–36.0)
MCV: 89 fL (ref 80.0–100.0)
Monocytes Absolute: 1.2 K/uL — ABNORMAL HIGH (ref 0.1–1.0)
Monocytes Relative: 6 %
Neutro Abs: 16.6 K/uL — ABNORMAL HIGH (ref 1.7–7.7)
Neutrophils Relative %: 76 %
Platelets: 409 K/uL — ABNORMAL HIGH (ref 150–400)
RBC: 2.82 MIL/uL — ABNORMAL LOW (ref 3.87–5.11)
RDW: 17.3 % — ABNORMAL HIGH (ref 11.5–15.5)
WBC: 21.8 K/uL — ABNORMAL HIGH (ref 4.0–10.5)
nRBC: 0.1 % (ref 0.0–0.2)

## 2024-01-30 LAB — VITAMIN B12: Vitamin B-12: 3068 pg/mL — ABNORMAL HIGH (ref 180–914)

## 2024-01-30 LAB — MAGNESIUM: Magnesium: 1.7 mg/dL (ref 1.7–2.4)

## 2024-01-30 LAB — PHOSPHORUS: Phosphorus: 2.9 mg/dL (ref 2.5–4.6)

## 2024-01-30 LAB — TROPONIN I (HIGH SENSITIVITY)
Troponin I (High Sensitivity): 13 ng/L (ref <18)
Troponin I (High Sensitivity): 11 ng/L (ref <18)

## 2024-01-30 MED ORDER — PANTOPRAZOLE SODIUM 40 MG IV SOLR
40.0000 mg | Freq: Two times a day (BID) | INTRAVENOUS | Status: DC
  Administered 2024-01-30: 40 mg via INTRAVENOUS
  Filled 2024-01-30: qty 10

## 2024-01-30 MED ORDER — IOHEXOL 300 MG/ML  SOLN
100.0000 mL | Freq: Once | INTRAMUSCULAR | Status: AC | PRN
  Administered 2024-01-30: 100 mL via INTRAVENOUS

## 2024-01-30 MED ORDER — PANTOPRAZOLE SODIUM 40 MG PO TBEC
40.0000 mg | DELAYED_RELEASE_TABLET | Freq: Two times a day (BID) | ORAL | Status: DC
  Administered 2024-01-30 – 2024-02-03 (×8): 40 mg via ORAL
  Filled 2024-01-30 (×8): qty 1

## 2024-01-30 MED ORDER — IOHEXOL 300 MG/ML  SOLN
30.0000 mL | Freq: Once | INTRAMUSCULAR | Status: AC | PRN
  Administered 2024-01-30: 30 mL via ORAL

## 2024-01-30 MED ORDER — PIPERACILLIN-TAZOBACTAM 3.375 G IVPB
3.3750 g | Freq: Three times a day (TID) | INTRAVENOUS | Status: DC
  Administered 2024-01-30 – 2024-02-02 (×8): 3.375 g via INTRAVENOUS
  Filled 2024-01-30 (×9): qty 50

## 2024-01-30 MED ORDER — NITROGLYCERIN 0.4 MG SL SUBL
0.4000 mg | SUBLINGUAL_TABLET | SUBLINGUAL | Status: DC | PRN
  Administered 2024-01-30: 0.4 mg via SUBLINGUAL
  Filled 2024-01-30: qty 1

## 2024-01-30 MED ORDER — MORPHINE SULFATE (PF) 2 MG/ML IV SOLN: 2.0000 mg | INTRAVENOUS | Status: DC | PRN

## 2024-01-30 NOTE — Progress Notes (Signed)
 PHARMACIST - PHYSICIAN COMMUNICATION   CONCERNING: IV to Oral Route Change Policy  RECOMMENDATION: This patient is receiving PROTONIX  by the intravenous route.  Based on criteria approved by the Pharmacy and Therapeutics Committee, the intravenous medication(s) is/are being converted to the equivalent oral dose form(s).   DESCRIPTION: These criteria include: The patient is eating (either orally or via tube) and/or has been taking other orally administered medications for a least 24 hours The patient has no evidence of active gastrointestinal bleeding or impaired GI absorption (gastrectomy, short bowel, patient on TNA or NPO).  If you have questions about this conversion, please contact the Pharmacy Department  []   [phone] )  [facility] [x]   [phone] )  [facility] []   [phone] )  [facility] []   [phone] )  [facility] []   [phone] )  [facility]   Allean Haas PharmD Clinical Pharmacist 01/30/2024

## 2024-01-30 NOTE — TOC Progression Note (Signed)
 Transition of Care [facility]) - Progression Note    Patient Details  Name: [name] MRN: [id number] Date of Birth: 1945/12/11  [facility]) CM/SW Contact  Racheal LITTIE Schimke, RN Phone Number: 01/30/2024, 1:20 PM  Clinical Narrative: Patient receptive to HHPT/OT services, PT/OT evaluation pending.       Barriers to Discharge: Continued Medical Work up  Expected Discharge Plan and Services       Living arrangements for the past 2 months: Single Family Home                                       Social Determinants of Health (SDOH) Interventions SDOH Screenings   Food Insecurity: No Food Insecurity (01/26/2024)  Housing: Low Risk  (01/26/2024)  Transportation Needs: No Transportation Needs (01/26/2024)  Utilities: Not At Risk (01/26/2024)  Alcohol Screen: Low Risk  (08/12/2023)  Depression (PHQ2-9): Low Risk  (01/15/2024)  Financial Resource Strain: Low Risk  (08/12/2023)  Physical Activity: Sufficiently Active (08/12/2023)  Social Connections: Unknown (01/26/2024)  Stress: No Stress Concern Present (08/12/2023)  Tobacco Use: Low Risk  (01/26/2024)  Health Literacy: Adequate Health Literacy (08/12/2023)    Readmission Risk Interventions     No data to display

## 2024-01-30 NOTE — Progress Notes (Signed)
 Physical Therapy Treatment Patient Details Name: [name] MRN: [id number] DOB: Jan 30, 1946 Today's Date: 01/30/2024   History of Present Illness [name] is a 77yoF who comes to [facility] 01/25/24 c belly pain- reports decreased PO, annorexia since last chemo on 6/26. PMH: BrCA on chemotherapy, HTN, HLD, sleep apnea, diverticulosis of colon, chronic back pain. In ED BP 89/32mmHg. Pt admitted with diagnosis of acute diverticulitis.    PT Comments  Pt is supine in bed with HOB elevated, extremely motivated and agreeable to therapy today. PT focused today's session on stair training and increasing ambulation distance traversed without a required seated break. During session pt was able to perform bed mobility (supine to sit) with mod independence, performed 2x trials STS with CGA/SBA (1x trials STS from EOB to [facility] prior to ambulation, 1x trial from recliner following seated recovery secondary to stairs), ambulated 171ft from EOB to stairwell with CGA, ascended/descended 6 steps before requiring a short seated rest break to bring HR to baseline (HR: 100 bpm at rest, rose to 126 bpm with activity), and ambulated 12ft back to room with SPC and CGA (for safety). Pt continues to make progress towards goals and increased activity tolerance compared to last session. Pt would benefit from skilled PT to continue to work towards PT goals and return to PLOF.     If plan is discharge home, recommend the following: Assistance with cooking/housework;Help with stairs or ramp for entrance;Assist for transportation;Direct supervision/assist for medications management   Can travel by private vehicle     Yes  Equipment Recommendations  None recommended by PT    Recommendations for Other Services       Precautions / Restrictions Precautions Precautions: Fall Recall of Precautions/Restrictions: Intact Restrictions Weight Bearing Restrictions Per Provider Order: No     Mobility  Bed Mobility Overal bed  mobility: Needs Assistance Bed Mobility: Supine to Sit     Supine to sit: Modified independent (Device/Increase time), Used rails          Transfers Overall transfer level: Needs assistance Equipment used: Rolling walker (2 wheels) Transfers: Sit to/from Stand Sit to Stand: Contact guard assist, Supervision   Step pivot transfers: Supervision       General transfer comment: Pt performed 2x trials STS (1x trials STS from EOB to [facility] prior to ambulation, 1x trial from recliner following seated recovery secondary to stairs).    Ambulation/Gait Ambulation/Gait assistance: Contact guard assist, Supervision   Assistive device: Straight cane Gait Pattern/deviations: Step-to pattern Gait velocity: decreased     General Gait Details: Steady pace with SPC with no overt LOB. Pt was able to ambulate 120 ft from EOB to stairwell, following stairs, pt required a short seated rest break before ambulating 151ft back to room.   Stairs Stairs: Yes Stairs assistance: Contact guard assist Stair Management: One rail Right, One rail Left, Step to pattern Number of Stairs: 6 General stair comments: Steady stair navigation with SPC, no LOB noted   Wheelchair Mobility     Tilt Bed    Modified Rankin (Stroke Patients Only)       Balance Overall balance assessment: Needs assistance Sitting-balance support: Feet supported Sitting balance-Leahy Scale: Good Sitting balance - Comments: Steady static seated balance reaching within BOS   Standing balance support: During functional activity, Single extremity supported Standing balance-Leahy Scale: Good Standing balance comment: Steady dynamic balance  Communication Communication Communication: No apparent difficulties  Cognition Arousal: Alert Behavior During Therapy: WFL for tasks assessed/performed                             Following commands: Intact      Cueing Cueing  Techniques: Verbal cues  Exercises      General Comments General comments (skin integrity, edema, etc.): Pre session: HR: 100 bpm, SpO2: 99% on room air, Following ambulation and stairs: HR: 126 bpm, SpO2: 98% on room, With 120 ft of ambulation: HR: 122 bpm, SpO2: 96% on room air, air, Post session: HR: 103 bpm, SpO2: 99% on room air.      Pertinent Vitals/Pain Pain Assessment Pain Assessment: No/denies pain    Home Living                          Prior Function            PT Goals (current goals can now be found in the care plan section) Acute Rehab PT Goals Patient Stated Goal: be more independent with IADL, self care PT Goal Formulation: With patient Time For Goal Achievement: 02/10/24 Potential to Achieve Goals: Good Progress towards PT goals: Progressing toward goals    Frequency    Min 2X/week      PT Plan      Co-evaluation              AM-PAC PT 6 Clicks Mobility   Outcome Measure  Help needed turning from your back to your side while in a flat bed without using bedrails?: None Help needed moving from lying on your back to sitting on the side of a flat bed without using bedrails?: None Help needed moving to and from a bed to a chair (including a wheelchair)?: A Little Help needed standing up from a chair using your arms (e.g., wheelchair or bedside chair)?: A Little Help needed to walk in hospital room?: A Little Help needed climbing 3-5 steps with a railing? : A Little 6 Click Score: 20    End of Session Equipment Utilized During Treatment: Gait belt Activity Tolerance: Patient tolerated treatment well Patient left: in chair;with call bell/phone within reach;with chair alarm set Nurse Communication: Mobility status PT Visit Diagnosis: Other abnormalities of gait and mobility (R26.89);Unsteadiness on feet (R26.81);Difficulty in walking, not elsewhere classified (R26.2)     Time: 8474-8454 PT Time Calculation (min) (ACUTE ONLY): 20  min  Charges:                            Lorrinda Ramstad, SPT 01/30/24, 5:07 PM

## 2024-01-30 NOTE — Progress Notes (Signed)
   01/30/24 0300  Assess: MEWS Score  Temp 98.9 F (37.2 C)  BP (!) 146/63  MAP (mmHg) 87  Pulse Rate (!) 109  Resp (!) 22  Level of Consciousness Alert  SpO2 100 %  O2 Device Room Air  Assess: MEWS Score  MEWS Temp 0  MEWS Systolic 0  MEWS Pulse 1  MEWS RR 1  MEWS LOC 0  MEWS Score 2  MEWS Score Color Yellow  Assess: if the MEWS score is Yellow or Red  Were vital signs accurate and taken at a resting state? Yes  Does the patient meet 2 or more of the SIRS criteria? No  MEWS guidelines implemented  Yes, yellow  Treat  MEWS Interventions Considered administering scheduled or prn medications/treatments as ordered  Take Vital Signs  Increase Vital Sign Frequency  Yellow: Q2hr x1, continue Q4hrs until patient remains green for 12hrs  Escalate  MEWS: Escalate Yellow: Discuss with charge nurse and consider notifying provider and/or RRT  Notify: Charge Nurse/RN  Name of Charge Nurse/RN Notified Ritta Quale, RN  Provider Notification  Provider Name/Title Madison Brew, Hospitalist  Date Provider Notified 01/30/24  Time Provider Notified 0310  Method of Notification Page  Notification Reason Change in status (c/o chest pain)  Provider response See new orders  Date of Provider Response 01/30/24  Time of Provider Response 0350  Assess: SIRS CRITERIA  SIRS Temperature  0  SIRS Respirations  1  SIRS Pulse 1  SIRS WBC 0  SIRS Score Sum  2

## 2024-01-30 NOTE — Progress Notes (Signed)
 PROGRESS NOTE    [name]  FMW:[id number] DOB: [birth year] DOA: 01/25/2024 PCP: Antonetta Rollene BRAVO, MD  Chief Complaint  Patient presents with   Abdominal Pain   Weakness    Hospital Course:  [name] is a 78 year old female with breast cancer on chemotherapy, hypertension, hyperlipidemia, GERD, diverticulosis, who presents with abdominal pain, nausea, diarrhea.  Patient's most recent chemotherapy was 01/15/2024.  She denies blood or mucus in her stool.  CT revealed sigmoid diverticulosis with wall thickening, mucosal hyperenhancement, adjacent fat stranding.  No lymphadenopathy or metastatic disease seen in abdomen pelvis.  Patient was started on Zosyn  and admitted.  Stay has been complicated by persistent leukocytosis and AKI.  Subjective: Patient reports overnight she began vomiting.  No issues this morning but she reports she felt very unwell overnight.  She is starting to try food again this morning.  Objective: Vitals:   01/30/24 0300 01/30/24 0505 01/30/24 0751 01/30/24 1614  BP: (!) 146/63 131/65 (!) 160/83 119/70  Pulse: (!) 109 (!) 111 (!) 104 (!) 104  Resp: (!) 22 16 18 20   Temp: 98.9 F (37.2 C) 98.9 F (37.2 C)  (!) 97 F (36.1 C)  TempSrc: Oral Oral    SpO2: 100% 99% 98% 100%  Weight:      Height:        Intake/Output Summary (Last 24 hours) at 01/30/2024 1639 Last data filed at 01/30/2024 1451 Gross per 24 hour  Intake 320 ml  Output 400 ml  Net -80 ml   Filed Weights   01/25/24 1520  Weight: 68 kg    Examination: General exam: Appears calm and comfortable, NAD  Respiratory system: No work of breathing, symmetric chest wall expansion Cardiovascular system: S1 & S2 heard, RRR.  Gastrointestinal system: Abdomen is nondistended, soft and nontender.  Neuro: Alert and oriented. No focal neurological deficits. Extremities: Symmetric, expected ROM Skin: No rashes, lesions Psychiatry: Demonstrates appropriate judgement and insight. Mood &  affect appropriate for situation.   Assessment & Plan:  Principal Problem:   Acute diverticulitis Active Problems:   Hyperlipidemia LDL goal <100   Essential hypertension   GERD    Acute diverticulitis without perforation or abscess - Remains afebrile without pain.  Did vomit overnight.  Leukocytosis rising again - Zosyn  has been discontinued and transitioned to Augmentin . (Last dose Zosyn  0600 on 7/10.  Augmentin  initiated 7/10.  Status post 3 doses now)  - Repeat CT scan without abscess formation.  Redemonstrates known diverticulitis - Will consult infectious disease, appreciate recommendations.  In the interim we will escalate to Zosyn  again. - Will obtain blood cultures - Continue to monitor CBC and fever curve  Rising leukocytosis Tachycardia - Tachycardia and leukocytosis were improving.  Zosyn  was discontinued and changed to Augmentin .  Cytosis and tachycardia now rising again - On home dose metoprolol  - Lower extremity Dopplers 7/9 negative for DVT   Hypertension - Struggled with hypotension earlier this admission.  Resolved now. - Continue with losartan  and metoprolol .  Hold HCTZ in setting of AKI - Continue to monitor blood pressures closely, titrate as needed.  Hyperlipidemia - Resume statin at discharge  GERD - Continue PPI  AKI - Baseline creatinine 0.90, has risen to 1.15. - Creatinine has now resolved down baseline - Continue to monitor creatinine closely.  Resuming Zosyn  now.  Renally dosed with a creatinine clearance of 51 when needed.  Normocytic anemia - Hemoglobin remained stable - Elevated ferritin and TSAT, decreased TIBC.  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  Workup ongoing.  Have broadened  back to Zosyn .  Consultants:    Procedures:    Antimicrobials:  Anti-infectives (From admission, onward)    Start     Dose/Rate Route Frequency Ordered Stop   01/29/24 1300  amoxicillin -clavulanate (AUGMENTIN ) 875-125 MG per tablet 1 tablet        1 tablet Oral Every 12 hours 01/29/24 1208 02/02/24 0959   01/25/24 2200  piperacillin -tazobactam (ZOSYN ) IVPB 3.375 g  Status:  Discontinued        3.375 g 12.5 mL/hr over 4 Hours Intravenous Every 8 hours 01/25/24 1835 01/29/24 1208   01/25/24 1745  piperacillin -tazobactam (ZOSYN ) IVPB 3.375 g        3.375 g 100 mL/hr over 30 Minutes Intravenous  Once 01/25/24 1720 01/25/24 1825       Data Reviewed: I have personally reviewed following labs and imaging studies CBC: Recent Labs  Lab 01/26/24 0420 01/27/24 1047 01/28/24 0825 01/29/24 0452 01/30/24 0400  WBC 13.3* 15.4* 18.6* 15.5* 21.8*  NEUTROABS  --  11.4* 13.3* 10.6* 16.6*  HGB 7.8* 8.4* 8.8* 8.0* 8.3*  HCT 24.0* 25.7* 26.5* 24.6* 25.1*  MCV 89.6 88.3 89.5 88.2 89.0  PLT 252 311 322 354 409*   Basic Metabolic Panel: Recent Labs  Lab 01/26/24 0420 01/27/24 1047 01/28/24 0825 01/29/24 0452 01/30/24 0400  NA 136 142 144 141 143  K 3.9 4.0 3.8 3.4* 3.6  CL 104 111 108 106 108  CO2 22 21* 22 22 23   GLUCOSE 125* 137* 102* 109* 114*  BUN 14 8 8 8 9   CREATININE 0.97 0.90 1.15* 1.07* 0.63  CALCIUM  8.7* 8.9 9.3 8.9 9.0  MG  --   --   --  1.5* 1.7  PHOS  --   --   --  4.1 2.9   GFR: Estimated Creatinine Clearance: 52.3 mL/min (by C-G formula based on SCr of 0.63 mg/dL). Liver Function Tests: Recent Labs  Lab 01/26/24 0420 01/27/24 1047 01/28/24 0825 01/29/24 0452 01/30/24 0400  AST 39 35 34 30 29  ALT 36 29 28 24 24   ALKPHOS 123 128* 119 108 105  BILITOT 0.6 1.1 0.8 0.6 0.5  PROT 6.1* 6.0* 6.3* 6.4* 6.6  ALBUMIN 2.6* 2.5* 2.8* 2.7* 2.9*   CBG: No results for input(s): GLUCAP in the last 168 hours.  No results found for this or any previous visit (from  the past 240 hours).   Radiology Studies: CT ABDOMEN PELVIS W CONTRAST Result Date: 01/30/2024 CLINICAL DATA:  Elevated white count.  Possible diverticulitis. EXAM: CT ABDOMEN AND PELVIS WITH CONTRAST TECHNIQUE: Multidetector CT imaging of the abdomen and pelvis was performed using the standard protocol following bolus administration of intravenous contrast. RADIATION DOSE REDUCTION: This exam was performed according to the departmental dose-optimization program which includes automated exposure control, adjustment of the mA and/or kV according to patient size and/or use of iterative reconstruction technique. CONTRAST:  OMNIPAQUE  IOHEXOL  300 MG/ML SOLN, 30mL OMNIPAQUE  IOHEXOL  300 MG/ML SOLN COMPARISON:  January 25, 2024. FINDINGS: Lower chest: No acute abnormality. Hepatobiliary: No focal liver abnormality is seen. No gallstones, gallbladder wall thickening, or biliary dilatation. Pancreas: Unremarkable. No pancreatic ductal dilatation or surrounding inflammatory changes. Spleen: Normal in size without focal abnormality. Adrenals/Urinary Tract:  Adrenal glands are unremarkable. Kidneys are normal, without renal calculi, focal lesion, or hydronephrosis. Bladder is unremarkable. Stomach/Bowel: Small sliding-type hiatal hernia. Appendix is unremarkable. No evidence of bowel obstruction. Sigmoid diverticulosis is noted with moderate wall thickening and mild inflammatory changes suggesting possible sigmoid diverticulitis. No definite abscess formation is noted. Vascular/Lymphatic: Aortic atherosclerosis. No enlarged abdominal or pelvic lymph nodes. Reproductive: Status post hysterectomy. No adnexal masses. Other: No ascites or hernia is noted. Musculoskeletal: No acute or significant osseous findings. IMPRESSION: Probable mild sigmoid diverticulitis. No definite abscess formation is noted. Small sliding-type hiatal hernia. Aortic Atherosclerosis (ICD10-I70.0). Electronically Signed   By: Lynwood Landy Raddle M.D.   On:  01/30/2024 15:25    Scheduled Meds:  amoxicillin -clavulanate  1 tablet Oral Q12H   Chlorhexidine  Gluconate Cloth  6 each Topical Daily   enoxaparin  (LOVENOX ) injection  40 mg Subcutaneous Q24H   feeding supplement  237 mL Oral BID BM   gabapentin   100 mg Oral QHS   losartan   100 mg Oral Daily   metoprolol  succinate  50 mg Oral Daily   pantoprazole   40 mg Oral BID AC   sodium chloride  flush  10-40 mL Intracatheter Q12H   Continuous Infusions:     LOS: 5 days  MDM: Patient is high risk for one or more organ failure.  They necessitate ongoing hospitalization for continued IV therapies and subsequent lab monitoring. Total time spent interpreting labs and vitals, reviewing the medical record, coordinating care amongst consultants and care team members, directly assessing and discussing care with the patient and/or family: 55 min  Mackinsey Pelland, DO [facility]  To contact the attending physician between 7A-7P please use Epic Chat. To contact the covering physician during after hours 7P-7A, please review Amion.  01/30/2024, 4:39 PM   *This document has been created with the assistance of dictation software. Please excuse typographical errors. *

## 2024-01-30 NOTE — Plan of Care (Signed)
 The patient had a one time episode of nausea and vomiting at 0100am with PRN zofran  4mg  IV given. The patient c/o having chest pain 9/10 at 0300am. This writer notified the hospitalist [facility] and received orders for STAT Troponin, PRN nitogylcerin SL, PRN Morphine . This Clinical research associate gave the patient PRN Nitrogylcerin for 9/10 chest pain. This writer rechecked the patient's chest pain five minutes later and the patient said that her chest pain is 0. No further c/o chest pain or N/V at present.

## 2024-01-30 NOTE — Plan of Care (Signed)

## 2024-01-31 ENCOUNTER — Inpatient Hospital Stay

## 2024-01-31 DIAGNOSIS — K5792 Diverticulitis of intestine, part unspecified, without perforation or abscess without bleeding: Secondary | ICD-10-CM | POA: Diagnosis not present

## 2024-01-31 DIAGNOSIS — K219 Gastro-esophageal reflux disease without esophagitis: Secondary | ICD-10-CM | POA: Diagnosis not present

## 2024-01-31 DIAGNOSIS — I10 Essential (primary) hypertension: Secondary | ICD-10-CM | POA: Diagnosis not present

## 2024-01-31 DIAGNOSIS — E785 Hyperlipidemia, unspecified: Secondary | ICD-10-CM | POA: Diagnosis not present

## 2024-01-31 LAB — COMPREHENSIVE METABOLIC PANEL WITH GFR
ALT: 19 U/L (ref 0–44)
AST: 23 U/L (ref 15–41)
Albumin: 2.7 g/dL — ABNORMAL LOW (ref 3.5–5.0)
Alkaline Phosphatase: 85 U/L (ref 38–126)
Anion gap: 5 (ref 5–15)
BUN: 11 mg/dL (ref 8–23)
CO2: 26 mmol/L (ref 22–32)
Calcium: 9 mg/dL (ref 8.9–10.3)
Chloride: 111 mmol/L (ref 98–111)
Creatinine, Ser: 1.21 mg/dL — ABNORMAL HIGH (ref 0.44–1.00)
GFR, Estimated: 46 mL/min — ABNORMAL LOW (ref >60)
Glucose, Bld: 101 mg/dL — ABNORMAL HIGH (ref 70–99)
Potassium: 3.8 mmol/L (ref 3.5–5.1)
Sodium: 142 mmol/L (ref 135–145)
Total Bilirubin: 0.8 mg/dL (ref 0.0–1.2)
Total Protein: 5.9 g/dL — ABNORMAL LOW (ref 6.5–8.1)

## 2024-01-31 LAB — CBC WITH DIFFERENTIAL/PLATELET
Abs Immature Granulocytes: 0.54 K/uL — ABNORMAL HIGH (ref 0.00–0.07)
Basophils Absolute: 0.2 K/uL — ABNORMAL HIGH (ref 0.0–0.1)
Basophils Relative: 1 %
Eosinophils Absolute: 0 K/uL (ref 0.0–0.5)
Eosinophils Relative: 0 %
HCT: 23.9 % — ABNORMAL LOW (ref 36.0–46.0)
Hemoglobin: 7.7 g/dL — ABNORMAL LOW (ref 12.0–15.0)
Immature Granulocytes: 3 %
Lymphocytes Relative: 14 %
Lymphs Abs: 2.6 K/uL (ref 0.7–4.0)
MCH: 28.8 pg (ref 26.0–34.0)
MCHC: 32.2 g/dL (ref 30.0–36.0)
MCV: 89.5 fL (ref 80.0–100.0)
Monocytes Absolute: 1.2 K/uL — ABNORMAL HIGH (ref 0.1–1.0)
Monocytes Relative: 6 %
Neutro Abs: 13.8 K/uL — ABNORMAL HIGH (ref 1.7–7.7)
Neutrophils Relative %: 76 %
Platelets: 394 K/uL (ref 150–400)
RBC: 2.67 MIL/uL — ABNORMAL LOW (ref 3.87–5.11)
RDW: 17.6 % — ABNORMAL HIGH (ref 11.5–15.5)
WBC: 18.3 K/uL — ABNORMAL HIGH (ref 4.0–10.5)
nRBC: 0.2 % (ref 0.0–0.2)

## 2024-01-31 LAB — MAGNESIUM: Magnesium: 1.6 mg/dL — ABNORMAL LOW (ref 1.7–2.4)

## 2024-01-31 LAB — PHOSPHORUS: Phosphorus: 3.9 mg/dL (ref 2.5–4.6)

## 2024-01-31 MED ORDER — LACTATED RINGERS IV SOLN: INTRAVENOUS | Status: DC

## 2024-01-31 NOTE — Progress Notes (Signed)
 Mobility Specialist - Progress Note  01/31/24 1137  Mobility  Activity Ambulated with assistance in hallway  Level of Assistance Standby assist, set-up cues, supervision of patient - no hands on  Assistive Device Cane  Distance Ambulated (ft) 160 ft  Range of Motion/Exercises Active  Activity Response Tolerated well  Mobility Referral Yes  Mobility visit 1 Mobility  Mobility Specialist Start Time (ACUTE ONLY) 1115  Mobility Specialist Stop Time (ACUTE ONLY) 1129  Mobility Specialist Time Calculation (min) (ACUTE ONLY) 14 min   Pt resting in bed on RA upon entry. Pt STS and ambulates to hallway around NS SBA with cane. Pt endorses SOB after ambulation. Pt maintains steady walking gait with cane no complaints. Pt returned to recliner and left with needs in reach. Chair alarm activated.   Guido Rumble Mobility Specialist 01/31/24, 11:43 AM

## 2024-01-31 NOTE — Plan of Care (Signed)

## 2024-01-31 NOTE — Progress Notes (Signed)
 PROGRESS NOTE    [name]  FMW:[id number] DOB: Oct 07, 1945 DOA: 01/25/2024 PCP: Antonetta Rollene BRAVO, MD  Chief Complaint  Patient presents with   Abdominal Pain   Weakness    Hospital Course:  [name] is a 78 year old female with breast cancer on chemotherapy, hypertension, hyperlipidemia, GERD, diverticulosis, who presents with abdominal pain, nausea, diarrhea.  Patient's most recent chemotherapy was 01/15/2024.  She denies blood or mucus in her stool.  CT revealed sigmoid diverticulosis with wall thickening, mucosal hyperenhancement, adjacent fat stranding.  No lymphadenopathy or metastatic disease seen in abdomen pelvis.  Patient was started on Zosyn  and admitted.  Stay has been complicated by persistent leukocytosis and AKI.  Subjective: Patient reports she is feeling much better today than yesterday.  She does endorse some dyspnea on exertion with occasional cough.  Objective: Vitals:   01/30/24 1614 01/30/24 2038 01/31/24 0506 01/31/24 0756  BP: 119/70 (!) 148/76 (!) 116/51 (!) 117/54  Pulse: (!) 104  99 95  Resp: 20 16 16 16   Temp: (!) 97 F (36.1 C) 98 F (36.7 C) 98.6 F (37 C) 98.6 F (37 C)  TempSrc:      SpO2: 100% 100% 97% 97%  Weight:      Height:        Intake/Output Summary (Last 24 hours) at 01/31/2024 0829 Last data filed at 01/30/2024 1451 Gross per 24 hour  Intake 320 ml  Output --  Net 320 ml   Filed Weights   01/25/24 1520  Weight: 68 kg    Examination: General exam: Appears calm and comfortable, NAD  Respiratory system: No work of breathing, symmetric chest wall expansion Cardiovascular system: S1 & S2 heard, RRR.  Gastrointestinal system: Abdomen is nondistended, soft and nontender.  Neuro: Alert and oriented. No focal neurological deficits. Extremities: Symmetric, expected ROM Skin: No rashes, lesions Psychiatry: Demonstrates appropriate judgement and insight. Mood & affect appropriate for situation.   Assessment & Plan:   Principal Problem:   Acute diverticulitis Active Problems:   Hyperlipidemia LDL goal <100   Essential hypertension   GERD    Acute diverticulitis without perforation or abscess - Remains afebrile without pain. - Zosyn  discontinued and transition to Augmentin , patient then had acute worsening with vomiting, rising leukocytosis, worsening tachycardia.  She was transition back to Zosyn  on 7/11. (Last dose Zosyn  0600 on 7/10.  Augmentin  initiated 7/10.  Status post 3 doses--> back to zosyn  7/11)  - Repeat CT scan without abscess formation.  Redemonstrates known diverticulitis - Obtain new blood cultures, pending - Port site appears appropriate. - Surgical site well-healing.  No surrounding erythema - CXR with mild pulmonary edema, no evidence of infiltrate -Patient did receive pegfilgrastim  on 6/29.  Which has possible side effect of leukocytosis.  This may be contributing.  Though patient symptoms do correspond with diverticulitis - Will consult infectious disease, appreciate recommendations. - Continue to monitor CBC and fever curve  Rising leukocytosis Tachycardia - Infectious workup as above.   - On home dose metoprolol  - Lower extremity Dopplers 7/9 negative for DVT   Hypertension - Struggled with hypotension earlier this admission.  Resolved now. - Continue with losartan  and metoprolol .  Hold HCTZ in setting of AKI - Continue to monitor blood pressures closely, titrate as needed.  Hyperlipidemia - Resume statin at discharge  GERD - Continue PPI  AKI - baseline creatinine close to 0.9, up to 1.36 down trended to 0.63.  Now uptrending again. - Initiate LR again today - Renally  dosed with a creatinine clearance of 34 when needed  Normocytic anemia - Hemoglobin remained stable - Elevated ferritin and TSAT, decreased TIBC.  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  Have broadened back to Zosyn .  Consultants:  Treatment Team:  Consulting Physician: Fayette Bodily, MD  Procedures:    Antimicrobials:  Anti-infectives (From admission, onward)    Start     Dose/Rate Route Frequency Ordered Stop   01/30/24 1800  piperacillin -tazobactam (ZOSYN ) IVPB 3.375 g        3.375 g 12.5 mL/hr over 240 Minutes Intravenous Every 8 hours 01/30/24 1658     01/29/24 1300  amoxicillin -clavulanate (AUGMENTIN ) 875-125 MG per tablet 1 tablet  Status:  Discontinued        1 tablet Oral Every 12 hours 01/29/24 1208 01/30/24 1649   01/25/24 2200  piperacillin -tazobactam (ZOSYN ) IVPB 3.375 g  Status:  Discontinued        3.375 g 12.5 mL/hr over 4 Hours Intravenous Every 8 hours 01/25/24 1835 01/29/24 1208   01/25/24 1745  piperacillin -tazobactam (ZOSYN ) IVPB 3.375 g        3.375 g 100 mL/hr over 30 Minutes Intravenous  Once 01/25/24 1720 01/25/24 1825       Data Reviewed: I have personally reviewed following labs and imaging studies CBC: Recent Labs  Lab 01/27/24 1047 01/28/24 0825 01/29/24 0452 01/30/24 0400 01/31/24 0654  WBC 15.4* 18.6* 15.5* 21.8* 18.3*  NEUTROABS 11.4* 13.3* 10.6* 16.6* 13.8*  HGB 8.4* 8.8* 8.0* 8.3* 7.7*  HCT 25.7* 26.5* 24.6* 25.1* 23.9*  MCV 88.3 89.5 88.2 89.0 89.5  PLT 311 322 354 409* 394   Basic Metabolic Panel: Recent Labs  Lab 01/27/24 1047 01/28/24 0825 01/29/24 0452 01/30/24 0400 01/31/24 0654  NA 142 144 141 143 142  K 4.0 3.8 3.4* 3.6 3.8  CL 111 108 106 108 111  CO2 21* 22 22 23 26   GLUCOSE 137* 102* 109* 114* 101*  BUN 8 8 8 9 11   CREATININE 0.90 1.15* 1.07* 0.63 1.21*  CALCIUM  8.9 9.3 8.9 9.0 9.0  MG  --   --  1.5* 1.7 1.6*  PHOS  --   --  4.1 2.9 3.9   GFR: Estimated Creatinine Clearance: 34 mL/min (A) (by C-G formula based on SCr of 1.21  mg/dL (H)). Liver Function Tests: Recent Labs  Lab 01/27/24 1047 01/28/24 0825 01/29/24 0452 01/30/24 0400 01/31/24 0654  AST 35 34 30 29 23   ALT 29 28 24 24 19   ALKPHOS 128* 119 108 105 85  BILITOT 1.1 0.8 0.6 0.5 0.8  PROT 6.0* 6.3* 6.4* 6.6 5.9*  ALBUMIN 2.5* 2.8* 2.7* 2.9* 2.7*   CBG: No results for input(s): GLUCAP in the last 168 hours.  Recent Results (from the past 240 hours)  Culture, blood (Routine X 2) w Reflex to ID Panel     Status: None (Preliminary result)   Collection Time: 01/30/24  6:03 PM   Specimen: BLOOD LEFT ARM  Result Value Ref Range Status   Specimen Description BLOOD LEFT ARM  Final   Special Requests   Final    BOTTLES DRAWN AEROBIC AND ANAEROBIC Blood Culture adequate volume   Culture   Final    NO GROWTH < 12 HOURS Performed at [facility], [address], [place], [place] [address]    Report Status PENDING  Incomplete  Culture, blood (Routine X 2) w Reflex to ID Panel     Status: None (Preliminary result)   Collection Time: 01/30/24  6:12 PM   Specimen: BLOOD  Result Value Ref Range Status   Specimen Description BLOOD BLOOD LEFT HAND  Final   Special Requests   Final    BOTTLES DRAWN AEROBIC AND ANAEROBIC Blood Culture adequate volume   Culture   Final    NO GROWTH < 12 HOURS Performed at [facility], [address], [place], [place] [address]    Report Status PENDING  Incomplete     Radiology Studies: CT ABDOMEN PELVIS W CONTRAST Result Date: 01/30/2024 CLINICAL DATA:  Elevated white count.  Possible diverticulitis. EXAM: CT ABDOMEN AND PELVIS WITH CONTRAST TECHNIQUE: Multidetector CT imaging of the abdomen and pelvis was performed using the standard protocol following bolus administration of intravenous contrast. RADIATION DOSE REDUCTION: This exam was performed according to the departmental dose-optimization program which includes automated exposure control, adjustment of the mA and/or kV according to patient  size and/or use of iterative reconstruction technique. CONTRAST:  OMNIPAQUE  IOHEXOL  300 MG/ML SOLN, 30mL OMNIPAQUE  IOHEXOL  300 MG/ML SOLN COMPARISON:  January 25, 2024. FINDINGS: Lower chest: No acute abnormality. Hepatobiliary: No focal liver abnormality is seen. No gallstones, gallbladder wall thickening, or biliary dilatation. Pancreas: Unremarkable. No pancreatic ductal dilatation or surrounding inflammatory changes. Spleen: Normal in size without focal abnormality. Adrenals/Urinary Tract: Adrenal glands are unremarkable. Kidneys are normal, without renal calculi, focal lesion, or hydronephrosis. Bladder is unremarkable. Stomach/Bowel: Small sliding-type hiatal hernia. Appendix is unremarkable. No evidence of bowel obstruction. Sigmoid diverticulosis is noted with moderate wall thickening and mild inflammatory changes suggesting possible sigmoid diverticulitis. No definite abscess formation is noted. Vascular/Lymphatic: Aortic atherosclerosis. No enlarged abdominal or pelvic lymph nodes. Reproductive: Status post hysterectomy. No adnexal masses. Other: No ascites or hernia is noted. Musculoskeletal: No acute or significant osseous findings. IMPRESSION: Probable mild sigmoid diverticulitis. No definite abscess formation is noted. Small sliding-type hiatal hernia. Aortic Atherosclerosis (ICD10-I70.0). Electronically Signed   By: Lynwood Landy Raddle M.D.   On: 01/30/2024 15:25    Scheduled Meds:  Chlorhexidine  Gluconate Cloth  6 each Topical Daily   enoxaparin  (LOVENOX ) injection  40 mg Subcutaneous Q24H   feeding supplement  237 mL Oral BID BM   gabapentin   100 mg Oral QHS   losartan   100 mg Oral Daily   metoprolol  succinate  50 mg Oral Daily   pantoprazole   40 mg Oral BID AC   sodium chloride  flush  10-40 mL Intracatheter Q12H   Continuous Infusions:  piperacillin -tazobactam (ZOSYN )  IV 3.375 g (01/31/24 0255)      LOS: 6 days  MDM: Patient is high risk for one or more organ failure.  They  necessitate ongoing hospitalization for continued IV therapies and subsequent lab monitoring. Total time spent interpreting labs and vitals, reviewing the medical record, coordinating care amongst consultants and care team members, directly assessing and discussing care with the patient and/or family: 55 min  Adena Sima, DO [facility]  To contact the attending physician between 7A-7P please use Epic Chat. To contact the covering physician during after hours 7P-7A, please review Amion.  01/31/2024, 8:29 AM   *This document has been created with the assistance of dictation software. Please excuse typographical errors. *

## 2024-01-31 NOTE — Plan of Care (Signed)
  Problem: Health Behavior/Discharge Planning: Goal: Ability to manage health-related needs will improve Outcome: Progressing   Problem: Clinical Measurements: Goal: Diagnostic test results will improve Outcome: Progressing   Problem: Activity: Goal: Risk for activity intolerance will decrease Outcome: Progressing   Problem: Pain Managment: Goal: General experience of comfort will improve and/or be controlled Outcome: Progressing

## 2024-02-01 DIAGNOSIS — E785 Hyperlipidemia, unspecified: Secondary | ICD-10-CM | POA: Diagnosis not present

## 2024-02-01 DIAGNOSIS — K5792 Diverticulitis of intestine, part unspecified, without perforation or abscess without bleeding: Secondary | ICD-10-CM | POA: Diagnosis not present

## 2024-02-01 DIAGNOSIS — K219 Gastro-esophageal reflux disease without esophagitis: Secondary | ICD-10-CM | POA: Diagnosis not present

## 2024-02-01 DIAGNOSIS — I10 Essential (primary) hypertension: Secondary | ICD-10-CM | POA: Diagnosis not present

## 2024-02-01 LAB — COMPREHENSIVE METABOLIC PANEL WITH GFR
ALT: 17 U/L (ref 0–44)
AST: 22 U/L (ref 15–41)
Albumin: 2.6 g/dL — ABNORMAL LOW (ref 3.5–5.0)
Alkaline Phosphatase: 76 U/L (ref 38–126)
Anion gap: 9 (ref 5–15)
BUN: 15 mg/dL (ref 8–23)
CO2: 25 mmol/L (ref 22–32)
Calcium: 8.7 mg/dL — ABNORMAL LOW (ref 8.9–10.3)
Chloride: 110 mmol/L (ref 98–111)
Creatinine, Ser: 1.01 mg/dL — ABNORMAL HIGH (ref 0.44–1.00)
GFR, Estimated: 58 mL/min — ABNORMAL LOW (ref >60)
Glucose, Bld: 161 mg/dL — ABNORMAL HIGH (ref 70–99)
Potassium: 3.7 mmol/L (ref 3.5–5.1)
Sodium: 144 mmol/L (ref 135–145)
Total Bilirubin: 0.2 mg/dL (ref 0.0–1.2)
Total Protein: 5.9 g/dL — ABNORMAL LOW (ref 6.5–8.1)

## 2024-02-01 LAB — CBC WITH DIFFERENTIAL/PLATELET
Abs Immature Granulocytes: 0.32 K/uL — ABNORMAL HIGH (ref 0.00–0.07)
Basophils Absolute: 0.1 K/uL (ref 0.0–0.1)
Basophils Relative: 1 %
Eosinophils Absolute: 0 K/uL (ref 0.0–0.5)
Eosinophils Relative: 0 %
HCT: 22.8 % — ABNORMAL LOW (ref 36.0–46.0)
Hemoglobin: 7.4 g/dL — ABNORMAL LOW (ref 12.0–15.0)
Immature Granulocytes: 2 %
Lymphocytes Relative: 15 %
Lymphs Abs: 2.4 K/uL (ref 0.7–4.0)
MCH: 29.6 pg (ref 26.0–34.0)
MCHC: 32.5 g/dL (ref 30.0–36.0)
MCV: 91.2 fL (ref 80.0–100.0)
Monocytes Absolute: 1 K/uL (ref 0.1–1.0)
Monocytes Relative: 7 %
Neutro Abs: 11.7 K/uL — ABNORMAL HIGH (ref 1.7–7.7)
Neutrophils Relative %: 75 %
Platelets: 362 K/uL (ref 150–400)
RBC: 2.5 MIL/uL — ABNORMAL LOW (ref 3.87–5.11)
RDW: 18 % — ABNORMAL HIGH (ref 11.5–15.5)
WBC: 15.6 K/uL — ABNORMAL HIGH (ref 4.0–10.5)
nRBC: 0.3 % — ABNORMAL HIGH (ref 0.0–0.2)

## 2024-02-01 LAB — PHOSPHORUS: Phosphorus: 3.7 mg/dL (ref 2.5–4.6)

## 2024-02-01 LAB — MAGNESIUM: Magnesium: 1.6 mg/dL — ABNORMAL LOW (ref 1.7–2.4)

## 2024-02-01 NOTE — Progress Notes (Signed)
 PROGRESS NOTE    [name]  FMW:[id number] DOB: 1946-04-03 DOA: 01/25/2024 PCP: Antonetta Rollene BRAVO, MD  Chief Complaint  Patient presents with   Abdominal Pain   Weakness    Hospital Course:  [name] is a 78 year old female with breast cancer on chemotherapy, hypertension, hyperlipidemia, GERD, diverticulosis, who presents with abdominal pain, nausea, diarrhea.  Patient's most recent chemotherapy was 01/15/2024.  She denies blood or mucus in her stool.  CT revealed sigmoid diverticulosis with wall thickening, mucosal hyperenhancement, adjacent fat stranding.  No lymphadenopathy or metastatic disease seen in abdomen pelvis.  Patient was started on Zosyn  and admitted.  Stay has been complicated by persistent leukocytosis and AKI.  Subjective: No acute events overnight. On evaluation today patient reports she is feeling better.  She is having bowel movements.  No melena.   Objective: Vitals:   01/31/24 1716 01/31/24 2109 02/01/24 0430 02/01/24 0816  BP: 126/70 (!) 151/64 (!) 159/83 (!) 148/53  Pulse: 97 (!) 104  (!) 103  Resp: 20 20 18    Temp: 98.5 F (36.9 C) 97.7 F (36.5 C) 97.8 F (36.6 C) 99 F (37.2 C)  TempSrc: Oral     SpO2: 97% 100% 100% 100%  Weight:      Height:        Intake/Output Summary (Last 24 hours) at 02/01/2024 0849 Last data filed at 01/31/2024 1929 Gross per 24 hour  Intake 360 ml  Output --  Net 360 ml   Filed Weights   01/25/24 1520  Weight: 68 kg    Examination: General exam: Appears calm and comfortable, NAD  Respiratory system: No work of breathing, symmetric chest wall expansion Cardiovascular system: S1 & S2 heard, RRR.  Gastrointestinal system: Abdomen is nondistended, soft and nontender.  Neuro: Alert and oriented. No focal neurological deficits. Extremities: Symmetric, expected ROM Skin: No rashes, lesions Psychiatry: Demonstrates appropriate judgement and insight. Mood & affect appropriate for situation.    Assessment & Plan:  Principal Problem:   Acute diverticulitis Active Problems:   Hyperlipidemia LDL goal <100   Essential hypertension   GERD    Acute diverticulitis without perforation or abscess - Remains afebrile without pain. - Zosyn  discontinued and transition to Augmentin , patient then had acute worsening with vomiting, rising leukocytosis, worsening tachycardia.  She was transition back to Zosyn  on 7/11. (Last dose Zosyn  0600 on 7/10.  Augmentin  initiated 7/10.  Status post 3 doses--> back to zosyn  7/11)  -- WBC declining now, tachycardia improving.  Will continue with Zosyn  - Blood cultures remain negative, will follow to completion - No other source or site of infection.  Port site, surgical site without erythema.  Healing well.  CXR without infiltrate. - Rpt CT scan without abscess formation.  Redemonstrates known diverticulitis -Patient did receive pegfilgrastim  on 6/29.  Which has possible side effect of leukocytosis.  This may be contributing.  Though patient symptoms do correspond with diverticulitis - Will consult infectious disease, appreciate recommendations. - Continue to monitor fever curve and CBC  Rising leukocytosis Tachycardia - Infectious workup as above. - On home dose metoprolol  - Dopplers negative for DVT  Hypertension - Struggled with hypotension earlier this admission.  Resolved now. - Continue with losartan  and metoprolol .  Hold HCTZ in setting of AKI - Continue to monitor blood pressures closely, titrate as needed.  Hyperlipidemia - Resume statin at discharge  GERD - Continue PPI  AKI - baseline creatinine close to 0.9, up to 1.36 down trended to 0.63. - Status  post IV fluids again 7/12.  Creatinine improved - Encourage oral rehydration - Renally dosed for creatinine clearance of 40 when needed  Normocytic anemia - Hemoglobin remained stable - Elevated ferritin and TSAT, decreased TIBC.  Most consistent with anemia of chronic disease,  complicated by acute infection  Breast cancer, IDC - Currently on chemotherapy which is complicating the above presentation - Will need close outpatient follow-up with oncology  DVT prophylaxis: lovenox    Code Status: Full Code Disposition: Workup ongoing.  Have broadened back to Zosyn .  Consultants:  Treatment Team:  Consulting Physician: Fayette Bodily, MD  Procedures:    Antimicrobials:  Anti-infectives (From admission, onward)    Start     Dose/Rate Route Frequency Ordered Stop   01/30/24 1800  piperacillin -tazobactam (ZOSYN ) IVPB 3.375 g        3.375 g 12.5 mL/hr over 240 Minutes Intravenous Every 8 hours 01/30/24 1658     01/29/24 1300  amoxicillin -clavulanate (AUGMENTIN ) 875-125 MG per tablet 1 tablet  Status:  Discontinued        1 tablet Oral Every 12 hours 01/29/24 1208 01/30/24 1649   01/25/24 2200  piperacillin -tazobactam (ZOSYN ) IVPB 3.375 g  Status:  Discontinued        3.375 g 12.5 mL/hr over 4 Hours Intravenous Every 8 hours 01/25/24 1835 01/29/24 1208   01/25/24 1745  piperacillin -tazobactam (ZOSYN ) IVPB 3.375 g        3.375 g 100 mL/hr over 30 Minutes Intravenous  Once 01/25/24 1720 01/25/24 1825       Data Reviewed: I have personally reviewed following labs and imaging studies CBC: Recent Labs  Lab 01/28/24 0825 01/29/24 0452 01/30/24 0400 01/31/24 0654 02/01/24 0513  WBC 18.6* 15.5* 21.8* 18.3* 15.6*  NEUTROABS 13.3* 10.6* 16.6* 13.8* 11.7*  HGB 8.8* 8.0* 8.3* 7.7* 7.4*  HCT 26.5* 24.6* 25.1* 23.9* 22.8*  MCV 89.5 88.2 89.0 89.5 91.2  PLT 322 354 409* 394 362   Basic Metabolic Panel: Recent Labs  Lab 01/28/24 0825 01/29/24 0452 01/30/24 0400 01/31/24 0654 02/01/24 0513  NA 144 141 143 142 144  K 3.8 3.4* 3.6 3.8 3.7  CL 108 106 108 111 110  CO2 22 22 23 26 25   GLUCOSE 102* 109* 114* 101* 161*  BUN 8 8 9 11 15   CREATININE 1.15* 1.07* 0.63 1.21* 1.01*  CALCIUM  9.3 8.9 9.0 9.0 8.7*  MG  --  1.5* 1.7 1.6* 1.6*  PHOS  --  4.1  2.9 3.9 3.7   GFR: Estimated Creatinine Clearance: 40.8 mL/min (A) (by C-G formula based on SCr of 1.01 mg/dL (H)). Liver Function Tests: Recent Labs  Lab 01/28/24 0825 01/29/24 0452 01/30/24 0400 01/31/24 0654 02/01/24 0513  AST 34 30 29 23 22   ALT 28 24 24 19 17   ALKPHOS 119 108 105 85 76  BILITOT 0.8 0.6 0.5 0.8 0.2  PROT 6.3* 6.4* 6.6 5.9* 5.9*  ALBUMIN 2.8* 2.7* 2.9* 2.7* 2.6*   CBG: No results for input(s): GLUCAP in the last 168 hours.  Recent Results (from the past 240 hours)  Culture, blood (Routine X 2) w Reflex to ID Panel     Status: None (Preliminary result)   Collection Time: 01/30/24  6:03 PM   Specimen: BLOOD LEFT ARM  Result Value Ref Range Status   Specimen Description BLOOD LEFT ARM  Final   Special Requests   Final    BOTTLES DRAWN AEROBIC AND ANAEROBIC Blood Culture adequate volume   Culture   Final  NO GROWTH 2 DAYS Performed at [facility], [address] [place], [place] [address]    Report Status PENDING  Incomplete  Culture, blood (Routine X 2) w Reflex to ID Panel     Status: None (Preliminary result)   Collection Time: 01/30/24  6:12 PM   Specimen: BLOOD  Result Value Ref Range Status   Specimen Description BLOOD BLOOD LEFT HAND  Final   Special Requests   Final    BOTTLES DRAWN AEROBIC AND ANAEROBIC Blood Culture adequate volume   Culture   Final    NO GROWTH 2 DAYS Performed at [facility], [address] [place], [place] [address]    Report Status PENDING  Incomplete     Radiology Studies: DG Chest Port 1 View Result Date: 01/31/2024 CLINICAL DATA:  [id number] Dyspnea [id number] EXAM: PORTABLE CHEST 1 VIEW COMPARISON:  October 22, 2023 FINDINGS: The cardiomediastinal silhouette is unchanged in contour.Chest port with tip terminating over the confluence of the LEFT brachiocephalic vein and SVC. Atherosclerotic calcifications. No pleural effusion. No pneumothorax. Similar but favored mildly improved perihilar vascular  congestion compared to most recent prior. Mild background of interstitial prominence, improved since prior. Gaseous distension of a loop of bowel in the LEFT abdomen. Unchanged elevation of the RIGHT hemidiaphragm. IMPRESSION: Persistent but favored mildly improved pulmonary edema. Electronically Signed   By: Corean Salter M.D.   On: 01/31/2024 12:06   CT ABDOMEN PELVIS W CONTRAST Result Date: 01/30/2024 CLINICAL DATA:  Elevated white count.  Possible diverticulitis. EXAM: CT ABDOMEN AND PELVIS WITH CONTRAST TECHNIQUE: Multidetector CT imaging of the abdomen and pelvis was performed using the standard protocol following bolus administration of intravenous contrast. RADIATION DOSE REDUCTION: This exam was performed according to the departmental dose-optimization program which includes automated exposure control, adjustment of the mA and/or kV according to patient size and/or use of iterative reconstruction technique. CONTRAST:  OMNIPAQUE  IOHEXOL  300 MG/ML SOLN, 30mL OMNIPAQUE  IOHEXOL  300 MG/ML SOLN COMPARISON:  January 25, 2024. FINDINGS: Lower chest: No acute abnormality. Hepatobiliary: No focal liver abnormality is seen. No gallstones, gallbladder wall thickening, or biliary dilatation. Pancreas: Unremarkable. No pancreatic ductal dilatation or surrounding inflammatory changes. Spleen: Normal in size without focal abnormality. Adrenals/Urinary Tract: Adrenal glands are unremarkable. Kidneys are normal, without renal calculi, focal lesion, or hydronephrosis. Bladder is unremarkable. Stomach/Bowel: Small sliding-type hiatal hernia. Appendix is unremarkable. No evidence of bowel obstruction. Sigmoid diverticulosis is noted with moderate wall thickening and mild inflammatory changes suggesting possible sigmoid diverticulitis. No definite abscess formation is noted. Vascular/Lymphatic: Aortic atherosclerosis. No enlarged abdominal or pelvic lymph nodes. Reproductive: Status post hysterectomy. No adnexal  masses. Other: No ascites or hernia is noted. Musculoskeletal: No acute or significant osseous findings. IMPRESSION: Probable mild sigmoid diverticulitis. No definite abscess formation is noted. Small sliding-type hiatal hernia. Aortic Atherosclerosis (ICD10-I70.0). Electronically Signed   By: Lynwood Landy Raddle M.D.   On: 01/30/2024 15:25    Scheduled Meds:  Chlorhexidine  Gluconate Cloth  6 each Topical Daily   enoxaparin  (LOVENOX ) injection  40 mg Subcutaneous Q24H   feeding supplement  237 mL Oral BID BM   gabapentin   100 mg Oral QHS   losartan   100 mg Oral Daily   metoprolol  succinate  50 mg Oral Daily   pantoprazole   40 mg Oral BID AC   sodium chloride  flush  10-40 mL Intracatheter Q12H   Continuous Infusions:  piperacillin -tazobactam (ZOSYN )  IV 3.375 g (02/01/24 0204)      LOS: 7 days  MDM: Patient is high risk for one or more organ failure.  They necessitate ongoing hospitalization for continued IV therapies and subsequent lab monitoring. Total time spent interpreting labs and vitals, reviewing the medical record, coordinating care amongst consultants and care team members, directly assessing and discussing care with the patient and/or family: 55 min  Golden Emile, DO [facility]  To contact the attending physician between 7A-7P please use Epic Chat. To contact the covering physician during after hours 7P-7A, please review Amion.  02/01/2024, 8:49 AM   *This document has been created with the assistance of dictation software. Please excuse typographical errors. *

## 2024-02-01 NOTE — Plan of Care (Signed)
  Problem: Education: Goal: Knowledge of General Education information will improve Description: Including pain rating scale, medication(s)/side effects and non-pharmacologic comfort measures Outcome: Progressing   Problem: Health Behavior/Discharge Planning: Goal: Ability to manage health-related needs will improve Outcome: Progressing   Problem: Clinical Measurements: Goal: Ability to maintain clinical measurements within normal limits will improve Outcome: Progressing Goal: Will remain free from infection Outcome: Progressing Goal: Diagnostic test results will improve Outcome: Progressing   Problem: Activity: Goal: Risk for activity intolerance will decrease Outcome: Progressing   Problem: Nutrition: Goal: Adequate nutrition will be maintained Outcome: Progressing   Problem: Elimination: Goal: Will not experience complications related to bowel motility Outcome: Progressing Goal: Will not experience complications related to urinary retention Outcome: Progressing   Problem: Pain Managment: Goal: General experience of comfort will improve and/or be controlled Outcome: Progressing   Problem: Safety: Goal: Ability to remain free from injury will improve Outcome: Progressing   Problem: Skin Integrity: Goal: Risk for impaired skin integrity will decrease Outcome: Progressing

## 2024-02-01 NOTE — Plan of Care (Signed)
  Problem: Health Behavior/Discharge Planning: Goal: Ability to manage health-related needs will improve Outcome: Progressing   Problem: Clinical Measurements: Goal: Will remain free from infection Outcome: Progressing   Problem: Activity: Goal: Risk for activity intolerance will decrease Outcome: Progressing   Problem: Elimination: Goal: Will not experience complications related to urinary retention Outcome: Progressing

## 2024-02-02 DIAGNOSIS — I10 Essential (primary) hypertension: Secondary | ICD-10-CM | POA: Diagnosis not present

## 2024-02-02 DIAGNOSIS — D72829 Elevated white blood cell count, unspecified: Secondary | ICD-10-CM

## 2024-02-02 DIAGNOSIS — K219 Gastro-esophageal reflux disease without esophagitis: Secondary | ICD-10-CM | POA: Diagnosis not present

## 2024-02-02 DIAGNOSIS — E785 Hyperlipidemia, unspecified: Secondary | ICD-10-CM | POA: Diagnosis not present

## 2024-02-02 DIAGNOSIS — K5792 Diverticulitis of intestine, part unspecified, without perforation or abscess without bleeding: Secondary | ICD-10-CM | POA: Diagnosis not present

## 2024-02-02 LAB — CBC WITH DIFFERENTIAL/PLATELET
Abs Immature Granulocytes: 0.25 K/uL — ABNORMAL HIGH (ref 0.00–0.07)
Basophils Absolute: 0.1 K/uL (ref 0.0–0.1)
Basophils Relative: 1 %
Eosinophils Absolute: 0 K/uL (ref 0.0–0.5)
Eosinophils Relative: 0 %
HCT: 22.7 % — ABNORMAL LOW (ref 36.0–46.0)
Hemoglobin: 7.2 g/dL — ABNORMAL LOW (ref 12.0–15.0)
Immature Granulocytes: 2 %
Lymphocytes Relative: 20 %
Lymphs Abs: 2.3 K/uL (ref 0.7–4.0)
MCH: 29 pg (ref 26.0–34.0)
MCHC: 31.7 g/dL (ref 30.0–36.0)
MCV: 91.5 fL (ref 80.0–100.0)
Monocytes Absolute: 0.9 K/uL (ref 0.1–1.0)
Monocytes Relative: 8 %
Neutro Abs: 7.9 K/uL — ABNORMAL HIGH (ref 1.7–7.7)
Neutrophils Relative %: 69 %
Platelets: 373 K/uL (ref 150–400)
RBC: 2.48 MIL/uL — ABNORMAL LOW (ref 3.87–5.11)
RDW: 19 % — ABNORMAL HIGH (ref 11.5–15.5)
WBC: 11.5 K/uL — ABNORMAL HIGH (ref 4.0–10.5)
nRBC: 0.3 % — ABNORMAL HIGH (ref 0.0–0.2)

## 2024-02-02 LAB — COMPREHENSIVE METABOLIC PANEL WITH GFR
ALT: 15 U/L (ref 0–44)
AST: 19 U/L (ref 15–41)
Albumin: 2.4 g/dL — ABNORMAL LOW (ref 3.5–5.0)
Alkaline Phosphatase: 64 U/L (ref 38–126)
Anion gap: 10 (ref 5–15)
BUN: 15 mg/dL (ref 8–23)
CO2: 25 mmol/L (ref 22–32)
Calcium: 8.9 mg/dL (ref 8.9–10.3)
Chloride: 111 mmol/L (ref 98–111)
Creatinine, Ser: 1.14 mg/dL — ABNORMAL HIGH (ref 0.44–1.00)
GFR, Estimated: 50 mL/min — ABNORMAL LOW (ref >60)
Glucose, Bld: 87 mg/dL (ref 70–99)
Potassium: 3.7 mmol/L (ref 3.5–5.1)
Sodium: 146 mmol/L — ABNORMAL HIGH (ref 135–145)
Total Bilirubin: 0.6 mg/dL (ref 0.0–1.2)
Total Protein: 5.5 g/dL — ABNORMAL LOW (ref 6.5–8.1)

## 2024-02-02 LAB — PHOSPHORUS: Phosphorus: 4.3 mg/dL (ref 2.5–4.6)

## 2024-02-02 LAB — MAGNESIUM: Magnesium: 1.7 mg/dL (ref 1.7–2.4)

## 2024-02-02 MED ORDER — CIPROFLOXACIN HCL 500 MG PO TABS
750.0000 mg | ORAL_TABLET | Freq: Two times a day (BID) | ORAL | Status: DC
  Administered 2024-02-02 – 2024-02-03 (×3): 750 mg via ORAL
  Filled 2024-02-02 (×3): qty 2

## 2024-02-02 MED ORDER — ADULT MULTIVITAMIN W/MINERALS CH
1.0000 | ORAL_TABLET | Freq: Every day | ORAL | Status: DC
  Administered 2024-02-02 – 2024-02-03 (×2): 1 via ORAL
  Filled 2024-02-02 (×2): qty 1

## 2024-02-02 MED ORDER — METRONIDAZOLE 500 MG PO TABS
500.0000 mg | ORAL_TABLET | Freq: Three times a day (TID) | ORAL | Status: DC
  Administered 2024-02-02 – 2024-02-03 (×5): 500 mg via ORAL
  Filled 2024-02-02 (×5): qty 1

## 2024-02-02 NOTE — Progress Notes (Signed)
 Initial Nutrition Assessment  DOCUMENTATION CODES:   Not applicable  INTERVENTION:   -MVI with minerals daily -Ensure Plus High Protein po BID, each supplement provides 350 kcal and 20 grams of protein  -Continue regular diet -Educated pt on low fiber diet for diverticulitis; RD provided Low Fiber Nutrition Therapy handout from [facility] Nutrition Care Manual   NUTRITION DIAGNOSIS:   Increased nutrient needs related to acute illness, cancer and cancer related treatments as evidenced by estimated needs.  GOAL:   Patient will meet greater than or equal to 90% of their needs  MONITOR:   PO intake, Supplement acceptance  REASON FOR ASSESSMENT:   Malnutrition Screening Tool    ASSESSMENT:   Pt with medical history significant for breast cancer diagnosed in April 2025 s/p chemo third round on 01/15/2024, HTN, HLD, sleep apnea, diverticulosis of colon, chronic back pain who presented with abdominal pain, loose stool and generalized weakness.  Pt admitted with acute diverticulitis without perforation or abscess.   7/7- advanced to clear liquid diet 7/10- advanced to regular diet  Reviewed I/O's: -165 ml x 24 hours and +3.3 L since admission  UOP: 825 ml x 24 hours  Per H&P, pt reports she has had abdominal pain, loose stools, and weakness for 5 PTA, along with decreased appetite.   Pt awaiting ID consult secondary to increased WBC and tachycardia. She has been changed to zosyn .  WBC and tachcardia- braodedn with zosyn   Spoke with pt, who was sitting in recliner chair at time of visit. Pt shares that she is feeling a lot better; she reports that her son is bringing in some fish for her dinner tonight and she is excited about that. Pt shares that her appetite is improving day by days. She consumed 100% of mac and cheese and an Ensure for lunch. Per pt, she was told not to eat popcorn, beans, seeds, or nots; she states she is slowly learning what to eat so I don't hurt. She denies  any abdominal pain with her lunch or Ensure, but states she has an episode of emesis overnight with spaghetti. Noted pt was advanced to a regular diet from dysphagia 1 this morning. Pt shares that she did not understand what a puree diet was and thought that is what she needed. Pt has multiple missing teeth, but denies any difficulty swallowing or chewing. Discussed level of dysphagia diets offered for ease of intake, however, pt feels most comfortable with a regular diet so she can pick and choose foods that she can tolerate. Noted meal completions 50-100%. Pt enjoys the Ensure and plans to continue drinking them.   Pt reports thye say I'm losing weight, however, she is unsure of UBW or how much weight she lost. Pt thinks she has lot the most weight here due to decreased oral intake, but states she was told that she lost weight at her last chemo appointment, hence why she was given a reduced dose. Noted pt has experienced a 19% wt loss over the past 3 months, which is significant for time frame.   Discussed importance of good meal and supplement intake to promote healing. Pt amenable to Ensure.    Reviewed patient's dietary recall and discussed ways for pt to meet nutrition goals over the next several weeks. Explained reasons for pt to follow a low fiber diet over the next 6-8 weeks. Reviewed low fiber foods and high fiber foods. Discussed best practice for long term management of diverticulosis is a high fiber diet and  discussed ways to gradually increase fiber in the diet. Teach back method used. Pt verbalizes understanding of information provided. Expect fair compliance.  Pt with multiple questions regarding discharge disposition; deferred to TOC.   Medications reviewed and include neurontin  and protonix .   Labs reviewed: Na: 146.    NUTRITION - FOCUSED PHYSICAL EXAM:  Flowsheet Row Most Recent Value  Orbital Region No depletion  Upper Arm Region No depletion  Thoracic and Lumbar Region No  depletion  Buccal Region No depletion  Temple Region No depletion  Clavicle Bone Region Mild depletion  Clavicle and Acromion Bone Region Mild depletion  Scapular Bone Region Mild depletion  Dorsal Hand Mild depletion  Patellar Region Mild depletion  Anterior Thigh Region Mild depletion  Posterior Calf Region Mild depletion  Edema (RD Assessment) Mild  Hair Reviewed  Eyes Reviewed  Mouth Reviewed  Skin Reviewed  Nails Reviewed    Diet Order:   Diet Order             Diet regular Room service appropriate? Yes; Fluid consistency: Thin  Diet effective now                   EDUCATION NEEDS:   Education needs have been addressed  Skin:  Skin Assessment: Reviewed RN Assessment  Last BM:  02/02/24 (type 4)  Height:   Ht Readings from Last 1 Encounters:  01/25/24 5' (1.524 m)    Weight:   Wt Readings from Last 1 Encounters:  01/25/24 68 kg    Ideal Body Weight:  45.5 kg  BMI:  Body mass index is 29.29 kg/m.  Estimated Nutritional Needs:   Kcal:  1600-1800  Protein:  85-100 grams  Fluid:  1.6-1.8 L    Margery ORN, RD, LDN, CDCES Registered Dietitian III Certified Diabetes Care and Education Specialist If unable to reach this RD, please use RD Inpatient group chat on secure chat between hours of 8am-4 pm daily

## 2024-02-02 NOTE — Consult Note (Signed)
 NAME: [name]  DOB: 1946-05-12  MRN: [id number]  Date/Time: 02/02/2024 3:15 PM  REQUESTING PROVIDER: Dr.Dezii Subjective:  REASON FOR CONSULT: Leucocytosis ? [name] is a 78 y.o. 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   Depression    Diverticulosis of colon    GERD (gastroesophageal reflux disease)    Hyperlipidemia    Hypertension    Obesity    Osteoarthritis of knees, bilateral    Sleep apnea     Past Surgical History:  Procedure Laterality Date   ABDOMINAL HYSTERECTOMY     AXILLARY SENTINEL NODE BIOPSY Right 10/22/2023   Procedure: BIOPSY, LYMPH NODE, SENTINEL, AXILLARY;  Surgeon: Evonnie Dorothyann LABOR, DO;  Location: [facility];  Service: General;  Laterality: Right;   BREAST BIOPSY Right 09/09/2023   US  RT BREAST BX W LOC DEV 1ST LESION IMG BX SPEC US  GUIDE 09/09/2023 Lennon Nest, MD [facility]   BREAST LUMPECTOMY WITH RADIO FREQUENCY LOCALIZER Right 10/22/2023   Procedure: BREAST LUMPECTOMY WITH RADIO FREQUENCY LOCALIZER;  Surgeon: Evonnie Dorothyann LABOR, DO;  Location: [facility];  Service: General;  Laterality: Right;   COLONOSCOPY  07/23/2003   Dr. Keven Smith:numerous large scattered diverticula   COLONOSCOPY N/A 11/15/2013   Dr. Harvey: moderate diverticula, small internal hemorrhoids, redudant colon. Next TCS in 2025 with overtube.    ESOPHAGOGASTRODUODENOSCOPY (EGD) WITH ESOPHAGEAL DILATION N/A 11/15/2013   Dr. Harvey: stricture at GE junction s/p dilation. moderate erosive gastritis, negative H.pylori   MULTIPLE TOOTH EXTRACTIONS  2024   PORTACATH PLACEMENT Left 10/22/2023   Procedure: INSERTION, TUNNELED CENTRAL VENOUS DEVICE, WITH PORT;  Surgeon: Evonnie Dorothyann LABOR, DO;  Location: [facility];  Service: General;  Laterality: Left;   TOTAL KNEE ARTHROPLASTY  06/01/2004   left / Dr. Margrette   TOTAL KNEE ARTHROPLASTY  11/29/2003   right / Dr. Margrette   TUBAL LIGATION      Social History   Socioeconomic History   Marital status: Widowed    Spouse name: [name]   Number of children: 2   Years of education: 12   Highest education level: 12th grade  Occupational History   Occupation: retired     Associate Professor: UNEMPLOYED  Tobacco Use   Smoking status: Never    Passive exposure: Never  Smokeless tobacco: Never  Vaping Use   Vaping status: Never Used  Substance and Sexual Activity   Alcohol use: No   Drug use: No   Sexual activity: Not Currently    Partners: Male    Birth control/protection: Surgical  Other Topics Concern   Not on file  Social History Narrative   Not on file   Social Drivers of Health   Financial Resource Strain: Low Risk  (08/12/2023)   Overall Financial Resource Strain (CARDIA)    Difficulty of Paying Living Expenses: Not hard at all  Food Insecurity: No Food Insecurity (01/26/2024)   Hunger Vital Sign     Worried About Running Out of Food in the Last Year: Never true    Ran Out of Food in the Last Year: Never true  Transportation Needs: No Transportation Needs (01/26/2024)   PRAPARE - Administrator, Civil Service (Medical): No    Lack of Transportation (Non-Medical): No  Physical Activity: Sufficiently Active (08/12/2023)   Exercise Vital Sign    Days of Exercise per Week: 5 days    Minutes of Exercise per Session: 50 min  Stress: No Stress Concern Present (08/12/2023)   [employer] of Occupational Health - Occupational Stress Questionnaire    Feeling of Stress : Only a little  Social Connections: Unknown (01/26/2024)   Social Connection and Isolation Panel    Frequency of Communication with Friends and Family: Patient declined    Frequency of Social Gatherings with Friends and Family: Patient declined    Attends Religious Services: Patient declined    Database administrator or Organizations: Patient declined    Attends Banker Meetings: Patient declined    Marital Status: Widowed  Intimate Partner Violence: Not At Risk (01/26/2024)   Humiliation, Afraid, Rape, and Kick questionnaire    Fear of Current or Ex-Partner: No    Emotionally Abused: No    Physically Abused: No    Sexually Abused: No    Family History  Problem Relation Age of Onset   Diabetes Mother    Hypertension Mother    Heart disease Mother    Aneurysm Father    Diabetes Father    Hypertension Brother    Diabetes Brother    Diabetes Brother    Hypertension Brother    Breast cancer Maternal Aunt        dx >50   Cancer Maternal Aunt        unknown type; ? breast; dx >50?   Prostate cancer Maternal Uncle    Hypertension Son    No Known Allergies I? Current Facility-Administered Medications  Medication Dose Route Frequency Provider Last Rate Last Admin   acetaminophen  (TYLENOL ) tablet 650 mg  650 mg Oral Q6H PRN Roann Gouty, MD       Or   acetaminophen  (TYLENOL ) suppository 650 mg   650 mg Rectal Q6H PRN Paudel, Gouty, MD       albuterol  (PROVENTIL ) (2.5 MG/3ML) 0.083% nebulizer solution 2.5 mg  2.5 mg Inhalation Q6H PRN Paudel, Keshab, MD       Chlorhexidine  Gluconate Cloth 2 % PADS 6 each  6 each Topical Daily Dezii, Alexandra, DO   6 each at 02/01/24 1134   ciprofloxacin  (CIPRO ) tablet 750 mg  750 mg Oral BID Dezii, Alexandra, DO   750 mg at 02/02/24 1031   enoxaparin  (LOVENOX ) injection 40 mg  40 mg Subcutaneous Q24H Paudel, Gouty, MD   40 mg at 02/01/24 1651   feeding  supplement (ENSURE PLUS HIGH PROTEIN) liquid 237 mL  237 mL Oral BID BM Dezii, Alexandra, DO   237 mL at 02/02/24 1347   gabapentin  (NEURONTIN ) capsule 100 mg  100 mg Oral QHS Paudel, Nena, MD   100 mg at 02/01/24 2214   hydrALAZINE  (APRESOLINE ) injection 10 mg  10 mg Intravenous Q6H PRN Paudel, Keshab, MD   10 mg at 01/26/24 2034   HYDROmorphone  (DILAUDID ) injection 0.5-1 mg  0.5-1 mg Intravenous Q2H PRN Paudel, Keshab, MD   1 mg at 01/26/24 1852   losartan  (COZAAR ) tablet 100 mg  100 mg Oral Daily Franchot Novel, MD   100 mg at 02/02/24 1031   metoprolol  succinate (TOPROL -XL) 24 hr tablet 50 mg  50 mg Oral Daily Paudel, Nena, MD   50 mg at 02/02/24 1031   metroNIDAZOLE  (FLAGYL ) tablet 500 mg  500 mg Oral Q8H Dezii, Alexandra, DO   500 mg at 02/02/24 1353   morphine  (PF) 2 MG/ML injection 2 mg  2 mg Intravenous Q2H PRN Mansy, Jan A, MD       nitroGLYCERIN  (NITROSTAT ) SL tablet 0.4 mg  0.4 mg Sublingual Q5 min PRN Mansy, Jan A, MD   0.4 mg at 01/30/24 0413   ondansetron  (ZOFRAN ) tablet 4 mg  4 mg Oral Q6H PRN Paudel, Keshab, MD   4 mg at 02/01/24 9090   Or   ondansetron  (ZOFRAN ) injection 4 mg  4 mg Intravenous Q6H PRN Paudel, Keshab, MD   4 mg at 01/30/24 0022   pantoprazole  (PROTONIX ) EC tablet 40 mg  40 mg Oral BID AC Merrill, Kristin A, RPH   40 mg at 02/02/24 1031   sodium chloride  flush (NS) 0.9 % injection 10-40 mL  10-40 mL Intracatheter Q12H Dezii, Alexandra, DO   10 mL at 02/02/24 1036    sodium chloride  flush (NS) 0.9 % injection 10-40 mL  10-40 mL Intracatheter PRN Dezii, Alexandra, DO       traZODone  (DESYREL ) tablet 50 mg  50 mg Oral QHS PRN Duncan, Hazel V, MD   50 mg at 02/01/24 2214     Abtx:  Anti-infectives (From admission, onward)    Start     Dose/Rate Route Frequency Ordered Stop   02/02/24 0915  ciprofloxacin  (CIPRO ) tablet 750 mg        750 mg Oral 2 times daily 02/02/24 0826 02/09/24 0759   02/02/24 0915  metroNIDAZOLE  (FLAGYL ) tablet 500 mg        500 mg Oral Every 8 hours 02/02/24 0826 02/09/24 0559   01/30/24 1800  piperacillin -tazobactam (ZOSYN ) IVPB 3.375 g  Status:  Discontinued        3.375 g 12.5 mL/hr over 240 Minutes Intravenous Every 8 hours 01/30/24 1658 02/02/24 0826   01/29/24 1300  amoxicillin -clavulanate (AUGMENTIN ) 875-125 MG per tablet 1 tablet  Status:  Discontinued        1 tablet Oral Every 12 hours 01/29/24 1208 01/30/24 1649   01/25/24 2200  piperacillin -tazobactam (ZOSYN ) IVPB 3.375 g  Status:  Discontinued        3.375 g 12.5 mL/hr over 4 Hours Intravenous Every 8 hours 01/25/24 1835 01/29/24 1208   01/25/24 1745  piperacillin -tazobactam (ZOSYN ) IVPB 3.375 g        3.375 g 100 mL/hr over 30 Minutes Intravenous  Once 01/25/24 1720 01/25/24 1825       REVIEW OF SYSTEMS:  Const: negative fever, negative chills, negative weight loss Eyes: negative diplopia or visual changes, negative eye pain ENT:  negative coryza, negative sore throat Resp: negative cough, hemoptysis, dyspnea Cards: negative for chest pain, palpitations, lower extremity edema GU: negative for frequency, dysuria and hematuria GI:  abdominal pain, diarrhea,vomiting Skin: negative for rash and pruritus Heme: negative for easy bruising and gum/nose bleeding MS:  weakness Neurolo:negative for headaches, dizziness, vertigo, memory problems  Psych: negative for feelings of anxiety, depression  Endocrine: negative for thyroid , diabetes Allergy/Immunology-  nkda: Objective:  VITALS:  BP 139/71 (BP Location: Left Arm)   Pulse 93   Temp 98.2 F (36.8 C) (Oral)   Resp 16   Ht 5' (1.524 m)   Wt 68 kg   SpO2 100%   BMI 29.29 kg/m  LDA port PHYSICAL EXAM:  General: Alert, cooperative, no distress, appears stated age.  Head: Normocephalic, without obvious abnormality, atraumatic. Eyes: Conjunctivae clear, anicteric sclerae. Pupils are equal ENT Nares normal. No drainage or sinus tenderness. Lips, mucosa, and tongue normal. No Thrush Neck: Supple, symmetrical, no adenopathy, thyroid : non tender no carotid bruit and no JVD.  Prt in place clean Scar over rt breast/rt axiallry area healed well  Back: No CVA tenderness. Lungs: Clear to auscultation bilaterally. No Wheezing or Rhonchi. No rales. Heart: Regular rate and rhythm, no murmur, rub or gallop. Abdomen: Soft, not distended- some tenderness  Bowel sounds normal. No masses Lap scar  Extremities: b/l knee scars  no cyanosis. No edema. No clubbing Skin: No rashes or lesions. Or bruising Lymph: Cervical, supraclavicular normal. Neurologic: Grossly non-focal Pertinent Labs Lab Results CBC    Component Value Date/Time   WBC 11.5 (H) 02/02/2024 0517   RBC 2.48 (L) 02/02/2024 0517   HGB 7.2 (L) 02/02/2024 0517   HGB 12.0 01/31/2023 1145   HCT 22.7 (L) 02/02/2024 0517   HCT 37.0 01/31/2023 1145   PLT 373 02/02/2024 0517   PLT 204 01/31/2023 1145   MCV 91.5 02/02/2024 0517   MCV 92 01/31/2023 1145   MCH 29.0 02/02/2024 0517   MCHC 31.7 02/02/2024 0517   RDW 19.0 (H) 02/02/2024 0517   RDW 12.6 01/31/2023 1145   LYMPHSABS 2.3 02/02/2024 0517   LYMPHSABS 5.4 (H) 12/12/2022 1033   MONOABS 0.9 02/02/2024 0517   EOSABS 0.0 02/02/2024 0517   EOSABS 0.2 12/12/2022 1033   BASOSABS 0.1 02/02/2024 0517   BASOSABS 0.1 12/12/2022 1033       Latest Ref Rng & Units 02/02/2024    5:17 AM 02/01/2024    5:13 AM 01/31/2024    6:54 AM  CMP  Glucose 70 - 99 mg/dL 87  838  898   BUN 8 -  23 mg/dL 15  15  11    Creatinine 0.44 - 1.00 mg/dL 8.85  8.98  8.78   Sodium 135 - 145 mmol/L 146  144  142   Potassium 3.5 - 5.1 mmol/L 3.7  3.7  3.8   Chloride 98 - 111 mmol/L 111  110  111   CO2 22 - 32 mmol/L 25  25  26    Calcium  8.9 - 10.3 mg/dL 8.9  8.7  9.0   Total Protein 6.5 - 8.1 g/dL 5.5  5.9  5.9   Total Bilirubin 0.0 - 1.2 mg/dL 0.6  0.2  0.8   Alkaline Phos 38 - 126 U/L 64  76  85   AST 15 - 41 U/L 19  22  23    ALT 0 - 44 U/L 15  17  19        Microbiology: Recent Results (from the past  240 hours)  Culture, blood (Routine X 2) w Reflex to ID Panel     Status: None (Preliminary result)   Collection Time: 01/30/24  6:03 PM   Specimen: BLOOD LEFT ARM  Result Value Ref Range Status   Specimen Description   Final    BLOOD LEFT ARM Performed at [facility], [address] [place], [place] [address]    Special Requests   Final    BOTTLES DRAWN AEROBIC AND ANAEROBIC Blood Culture adequate volume Performed at [facility], [address] [place], [place] [address]    Culture   Final    NO GROWTH 3 DAYS Performed at [facility], [address], [place], [place] [address]    Report Status PENDING  Incomplete  Culture, blood (Routine X 2) w Reflex to ID Panel     Status: None (Preliminary result)   Collection Time: 01/30/24  6:12 PM   Specimen: BLOOD  Result Value Ref Range Status   Specimen Description   Final    BLOOD BLOOD LEFT HAND Performed at [facility], [address], [place], [place] [address]    Special Requests   Final    BOTTLES DRAWN AEROBIC AND ANAEROBIC Blood Culture adequate volume Performed at [facility], [address] [place], [place] [address]    Culture   Final    NO GROWTH 3 DAYS Performed at [facility], [address], [place], [place] [address]    Report Status PENDING  Incomplete    IMAGING RESULTS: Sigmoid diverticulitis  01/25/24 01/30/24-sigmoid diverticulits I have  personally reviewed the films ? Impression/Recommendation Sigmoid diverticulitis : on antibiotic Fluctuating leucocytosis- repeat Ct not showing any abscess or perforation- dc ing zosyn  and changing to augmentin  caused an increase in leucocytosis- now patient is on cipro  and flagyl - monitor leucocytosis and also side effects of cipro - metronidazole  can be decreased to BID dose Also got udenyca  which can increase leucocytes but not sure whether it would last  2 weeks  Ca breast s/p lumpectomy and receiving chemo- last dose on 6/26  Anemia  AkI    Discussed the management with the patient and hospitalist ? This consult involved complex antimicrobial management    ________________________________________________

## 2024-02-02 NOTE — Progress Notes (Signed)
 Occupational Therapy Treatment Patient Details Name: [name] MRN: [id number] DOB: 1946/04/07 Today's Date: 02/02/2024   History of present illness [name] is a 77yoF who comes to [facility] 01/25/24 c belly pain- reports decreased PO, annorexia since last chemo on 6/26. PMH: BrCA on chemotherapy, HTN, HLD, sleep apnea, diverticulosis of colon, chronic back pain. In ED BP 89/68mmHg. Pt admitted with diagnosis of acute diverticulitis.   OT comments  Pt seen for OT tx this date. Pt denies pain, requires supervision for functional STS transfers from various surfaces, and is able to complete functional mobility t/f bathroom using SPC with supervision for step pivot transfer, clothing mgmt and hygiene following continent BM. Pt does experience mild dyspnea with exertion, on RA for mobility with SpO2 96%. Pt donned 2L via [place] for comfort end of session. Discharge recommendation updated, pt will benefit from [facility] OT services as well as HH aide to assist with ADL independence. OT will continue to follow.       If plan is discharge home, recommend the following:  A little help with walking and/or transfers;A little help with bathing/dressing/bathroom;Assistance with cooking/housework;Assist for transportation   Equipment Recommendations  Other (comment)       Precautions / Restrictions Precautions Precautions: Fall Recall of Precautions/Restrictions: Intact Restrictions Weight Bearing Restrictions Per Provider Order: No       Mobility Bed Mobility Overal bed mobility: Needs Assistance             General bed mobility comments: seated in recliner chair at start/end of session    Transfers Overall transfer level: Needs assistance Equipment used: Straight cane Transfers: Sit to/from Stand Sit to Stand: Supervision     Step pivot transfers: Supervision           Balance Overall balance assessment: Needs assistance Sitting-balance support: Feet supported Sitting balance-Leahy  Scale: Good Sitting balance - Comments: Steady static seated balance reaching within BOS   Standing balance support: During functional activity, Single extremity supported Standing balance-Leahy Scale: Good Standing balance comment: Steady dynamic balance                           ADL either performed or assessed with clinical judgement   ADL Overall ADL's : Needs assistance/impaired     Grooming: Wash/dry hands;Standing Grooming Details (indicate cue type and reason): sink level                 Toilet Transfer: Supervision/safety;Rolling walker (2 wheels);Grab bars;Comfort height toilet;BSC/3in1 Statistician Details (indicate cue type and reason): 3:1 commode seat over regular toilet, used toilet rail and grab bar for transfer Toileting- Architect and Hygiene: Supervision/safety       Functional mobility during ADLs: Supervision/safety General ADL Comments: t/f bathroom     Communication Communication Communication: No apparent difficulties   Cognition Arousal: Alert Behavior During Therapy: WFL for tasks assessed/performed Cognition: No apparent impairments                               Following commands: Intact        Cueing   Cueing Techniques: Verbal cues        General Comments mild dyspnea on exertion. on 2L/min via [place] upon arrival, SpO2 99%. Doffed [place] for mobility as pt states she wears only at night,SpO2 96% on RA after walking t/f bathroom    Pertinent Vitals/ Pain  Pain Assessment Pain Assessment: No/denies pain   Frequency  Min 2X/week        Progress Toward Goals  OT Goals(current goals can now be found in the care plan section)  Progress towards OT goals: Progressing toward goals  Acute Rehab OT Goals OT Goal Formulation: With patient Time For Goal Achievement: 02/10/24 Potential to Achieve Goals: Fair  Plan         AM-PAC OT 6 Clicks Daily Activity     Outcome Measure   Help  from another person eating meals?: None Help from another person taking care of personal grooming?: None Help from another person toileting, which includes using toliet, bedpan, or urinal?: A Little Help from another person bathing (including washing, rinsing, drying)?: A Little Help from another person to put on and taking off regular upper body clothing?: None Help from another person to put on and taking off regular lower body clothing?: A Little 6 Click Score: 21    End of Session Equipment Utilized During Treatment: Gait belt (SPC)  OT Visit Diagnosis: Unsteadiness on feet (R26.81);Muscle weakness (generalized) (M62.81)   Activity Tolerance Patient tolerated treatment well   Patient Left in chair;with call bell/phone within reach;with chair alarm set   Nurse Communication Mobility status        Time: 9061-8992 OT Time Calculation (min): 29 min  Charges: OT General Charges $OT Visit: 1 Visit OT Treatments $Self Care/Home Management : 23-37 mins  [name], OTR/L  02/02/24, 12:44 PM

## 2024-02-02 NOTE — Care Management Important Message (Signed)
 Important Message  Patient Details  Name: [name] MRN: [id number] Date of Birth: 1945/12/07   Important Message Given:  Yes - Medicare IM     Aina Rossbach W, CMA 02/02/2024, 12:25 PM

## 2024-02-02 NOTE — Discharge Instructions (Signed)

## 2024-02-02 NOTE — TOC Progression Note (Signed)
 Transition of Care [facility]) - Progression Note    Patient Details  Name: [name] MRN: [id number] Date of Birth: 1946/04/07  [facility]) CM/SW Contact  Elouise LULLA Capri, RN 02/02/2024, 4:11 PM  Clinical Narrative:     CM to patient's room regarding [facility] accepting home health PT/OT/HHA referral. Patient in chair beside bed. Per patient, request for CM to call patient's son, [name]. CM call to patient's son, [name], phone: [phone] regarding [facility] and discharge plans. Per patient's son, will provide transportation at discharge and requests a call when patient is medically ready.     Barriers to Discharge: Continued Medical Work up  Expected Discharge Plan and Services    Home health PT/OT   Living arrangements for the past 2 months: Single Family Home      Social Determinants of Health (SDOH) Interventions SDOH Screenings   Food Insecurity: No Food Insecurity (01/26/2024)  Housing: Low Risk  (01/26/2024)  Transportation Needs: No Transportation Needs (01/26/2024)  Utilities: Not At Risk (01/26/2024)  Alcohol Screen: Low Risk  (08/12/2023)  Depression (PHQ2-9): Low Risk  (01/15/2024)  Financial Resource Strain: Low Risk  (08/12/2023)  Physical Activity: Sufficiently Active (08/12/2023)  Social Connections: Unknown (01/26/2024)  Stress: No Stress Concern Present (08/12/2023)  Tobacco Use: Low Risk  (01/26/2024)  Health Literacy: Adequate Health Literacy (08/12/2023)    Readmission Risk Interventions     No data to display

## 2024-02-02 NOTE — Plan of Care (Signed)
 Patient was understanding and calm and corporative during shift

## 2024-02-02 NOTE — Progress Notes (Signed)
 PROGRESS NOTE    [name]  FMW:[id number] DOB: 01/10/1946 DOA: 01/25/2024 PCP: Antonetta Rollene BRAVO, MD  Chief Complaint  Patient presents with   Abdominal Pain   Weakness    Hospital Course:  [name] is a 78 year old female with breast cancer on chemotherapy, hypertension, hyperlipidemia, GERD, diverticulosis, who presents with abdominal pain, nausea, diarrhea.  Patient's most recent chemotherapy was 01/15/2024.  She denies blood or mucus in her stool.  CT revealed sigmoid diverticulosis with wall thickening, mucosal hyperenhancement, adjacent fat stranding.  No lymphadenopathy or metastatic disease seen in abdomen pelvis.  Patient was started on Zosyn  and admitted.  Stay has been complicated by persistent leukocytosis and AKI.  Subjective: Patient endorses 2 loose bowel movements today but denies any abdominal pain.  Denies any bright red blood or melena.  No vomiting.  Tolerating her diet.  Objective: Vitals:   02/01/24 0816 02/01/24 1711 02/01/24 2100 02/02/24 0523  BP: (!) 148/53 135/65 (!) 140/59 (!) 121/58  Pulse: (!) 103 96 91 95  Resp:  16    Temp: 99 F (37.2 C) 98.2 F (36.8 C) 98.6 F (37 C) 98.4 F (36.9 C)  TempSrc:  Oral    SpO2: 100% 100% 100% 98%  Weight:      Height:        Intake/Output Summary (Last 24 hours) at 02/02/2024 0831 Last data filed at 02/01/2024 2100 Gross per 24 hour  Intake 660 ml  Output 825 ml  Net -165 ml   Filed Weights   01/25/24 1520  Weight: 68 kg    Examination: General exam: Appears calm and comfortable, NAD  Respiratory system: No work of breathing, symmetric chest wall expansion Cardiovascular system: S1 & S2 heard, RRR.  Gastrointestinal system: Abdomen is nondistended, soft and nontender.  Neuro: Alert and oriented. No focal neurological deficits. Extremities: Symmetric, expected ROM Skin: No rashes, lesions Psychiatry: Demonstrates appropriate judgement and insight. Mood & affect appropriate for  situation.   Assessment & Plan:  Principal Problem:   Acute diverticulitis Active Problems:   Hyperlipidemia LDL goal <100   Essential hypertension   GERD    Acute diverticulitis without perforation or abscess - Remains afebrile without pain. - Zosyn  discontinued and transition to Augmentin , patient then had acute worsening with vomiting, rising leukocytosis, worsening tachycardia.  She was transition back to Zosyn  on 7/11. (Last dose Zosyn  0600 on 7/10.  Augmentin  initiated 7/10.  Status post 3 doses--> back to zosyn  7/11) - WBC declining, tachycardia improving.  Will plan to discontinue Zosyn  today and transition to Cipro  Flagyl  for an additional 7 days. - Blood cultures remain negative. - No other source of infection.  Surgical site, port site looks clean and healing well.  CXR without infiltrate. - Rpt CT scan without abscess formation.  Redemonstrates known diverticulitis -Patient did receive pegfilgrastim  on 6/29.  Which has possible side effect of leukocytosis.  This may be contributing.  Though patient symptoms do correspond with diverticulitis - Will consult infectious disease, appreciate recommendations. - Continue to monitor fever curve and CBC  Rising leukocytosis Tachycardia - Infectious workup as above. - On home dose metoprolol .  Tachycardia improving now - Continue to monitor closely - Dopplers negative for DVT  Hypertension - Struggled with hypotension earlier this admission.  Resolved now. - Continue with losartan  and metoprolol .  Hold HCTZ in setting of AKI - Continue to monitor blood pressures closely, titrate as needed.  Hyperlipidemia - Resume statin at discharge  GERD - Continue PPI  AKI - baseline creatinine close to 0.9, up to 1.36 down trended to 0.63. - Status post IV fluids again 7/12.  Cr is improving. - Encourage oral rehydration - Renally dosed for creatinine clearance of 36 when needed  Normocytic anemia - Hemoglobin remained stable -  Elevated ferritin and TSAT, decreased TIBC.  Most consistent with anemia of chronic disease, complicated by acute infection  Breast cancer, IDC - Currently on chemotherapy which is complicating the above presentation - Will need close outpatient follow-up with oncology  DVT prophylaxis: lovenox    Code Status: Full Code Disposition: Monitoring on p.o. antibiotics now.  If remains afebrile with no elevation in WBC may be able to discharge next 48 hours  Consultants:  Treatment Team:  Consulting Physician: Fayette Bodily, MD  Procedures:    Antimicrobials:  Anti-infectives (From admission, onward)    Start     Dose/Rate Route Frequency Ordered Stop   02/02/24 0915  ciprofloxacin  (CIPRO ) tablet 750 mg        750 mg Oral 2 times daily 02/02/24 0826 02/09/24 0759   02/02/24 0915  metroNIDAZOLE  (FLAGYL ) tablet 500 mg        500 mg Oral Every 8 hours 02/02/24 0826 02/09/24 0559   01/30/24 1800  piperacillin -tazobactam (ZOSYN ) IVPB 3.375 g  Status:  Discontinued        3.375 g 12.5 mL/hr over 240 Minutes Intravenous Every 8 hours 01/30/24 1658 02/02/24 0826   01/29/24 1300  amoxicillin -clavulanate (AUGMENTIN ) 875-125 MG per tablet 1 tablet  Status:  Discontinued        1 tablet Oral Every 12 hours 01/29/24 1208 01/30/24 1649   01/25/24 2200  piperacillin -tazobactam (ZOSYN ) IVPB 3.375 g  Status:  Discontinued        3.375 g 12.5 mL/hr over 4 Hours Intravenous Every 8 hours 01/25/24 1835 01/29/24 1208   01/25/24 1745  piperacillin -tazobactam (ZOSYN ) IVPB 3.375 g        3.375 g 100 mL/hr over 30 Minutes Intravenous  Once 01/25/24 1720 01/25/24 1825       Data Reviewed: I have personally reviewed following labs and imaging studies CBC: Recent Labs  Lab 01/29/24 0452 01/30/24 0400 01/31/24 0654 02/01/24 0513 02/02/24 0517  WBC 15.5* 21.8* 18.3* 15.6* 11.5*  NEUTROABS 10.6* 16.6* 13.8* 11.7* 7.9*  HGB 8.0* 8.3* 7.7* 7.4* 7.2*  HCT 24.6* 25.1* 23.9* 22.8* 22.7*  MCV 88.2  89.0 89.5 91.2 91.5  PLT 354 409* 394 362 373   Basic Metabolic Panel: Recent Labs  Lab 01/29/24 0452 01/30/24 0400 01/31/24 0654 02/01/24 0513 02/02/24 0517  NA 141 143 142 144 146*  K 3.4* 3.6 3.8 3.7 3.7  CL 106 108 111 110 111  CO2 22 23 26 25 25   GLUCOSE 109* 114* 101* 161* 87  BUN 8 9 11 15 15   CREATININE 1.07* 0.63 1.21* 1.01* 1.14*  CALCIUM  8.9 9.0 9.0 8.7* 8.9  MG 1.5* 1.7 1.6* 1.6* 1.7  PHOS 4.1 2.9 3.9 3.7 4.3   GFR: Estimated Creatinine Clearance: 36.1 mL/min (A) (by C-G formula based on SCr of 1.14 mg/dL (H)). Liver Function Tests: Recent Labs  Lab 01/29/24 0452 01/30/24 0400 01/31/24 0654 02/01/24 0513 02/02/24 0517  AST 30 29 23 22 19   ALT 24 24 19 17 15   ALKPHOS 108 105 85 76 64  BILITOT 0.6 0.5 0.8 0.2 0.6  PROT 6.4* 6.6 5.9* 5.9* 5.5*  ALBUMIN 2.7* 2.9* 2.7* 2.6* 2.4*   CBG: No results for input(s): GLUCAP in the last  168 hours.  Recent Results (from the past 240 hours)  Culture, blood (Routine X 2) w Reflex to ID Panel     Status: None (Preliminary result)   Collection Time: 01/30/24  6:03 PM   Specimen: BLOOD LEFT ARM  Result Value Ref Range Status   Specimen Description BLOOD LEFT ARM  Final   Special Requests   Final    BOTTLES DRAWN AEROBIC AND ANAEROBIC Blood Culture adequate volume   Culture   Final    NO GROWTH 2 DAYS Performed at [facility], [address] [place], [place] [address]    Report Status PENDING  Incomplete  Culture, blood (Routine X 2) w Reflex to ID Panel     Status: None (Preliminary result)   Collection Time: 01/30/24  6:12 PM   Specimen: BLOOD  Result Value Ref Range Status   Specimen Description BLOOD BLOOD LEFT HAND  Final   Special Requests   Final    BOTTLES DRAWN AEROBIC AND ANAEROBIC Blood Culture adequate volume   Culture   Final    NO GROWTH 2 DAYS Performed at [facility], [address], [place], [place] [address]    Report Status PENDING  Incomplete     Radiology  Studies: DG Chest Port 1 View Result Date: 01/31/2024 CLINICAL DATA:  [id number] Dyspnea [id number] EXAM: PORTABLE CHEST 1 VIEW COMPARISON:  October 22, 2023 FINDINGS: The cardiomediastinal silhouette is unchanged in contour.Chest port with tip terminating over the confluence of the LEFT brachiocephalic vein and SVC. Atherosclerotic calcifications. No pleural effusion. No pneumothorax. Similar but favored mildly improved perihilar vascular congestion compared to most recent prior. Mild background of interstitial prominence, improved since prior. Gaseous distension of a loop of bowel in the LEFT abdomen. Unchanged elevation of the RIGHT hemidiaphragm. IMPRESSION: Persistent but favored mildly improved pulmonary edema. Electronically Signed   By: Corean Salter M.D.   On: 01/31/2024 12:06    Scheduled Meds:  Chlorhexidine  Gluconate Cloth  6 each Topical Daily   ciprofloxacin   750 mg Oral BID   enoxaparin  (LOVENOX ) injection  40 mg Subcutaneous Q24H   feeding supplement  237 mL Oral BID BM   gabapentin   100 mg Oral QHS   losartan   100 mg Oral Daily   metoprolol  succinate  50 mg Oral Daily   metroNIDAZOLE   500 mg Oral Q8H   pantoprazole   40 mg Oral BID AC   sodium chloride  flush  10-40 mL Intracatheter Q12H   Continuous Infusions:      LOS: 8 days  MDM: Patient is high risk for one or more organ failure.  They necessitate ongoing hospitalization for continued IV therapies and subsequent lab monitoring. Total time spent interpreting labs and vitals, reviewing the medical record, coordinating care amongst consultants and care team members, directly assessing and discussing care with the patient and/or family: 55 min  Whittley Carandang, DO [facility]  To contact the attending physician between 7A-7P please use Epic Chat. To contact the covering physician during after hours 7P-7A, please review Amion.  02/02/2024, 8:31 AM   *This document has been created with the assistance of dictation  software. Please excuse typographical errors. *

## 2024-02-03 ENCOUNTER — Other Ambulatory Visit: Payer: Self-pay

## 2024-02-03 DIAGNOSIS — K5792 Diverticulitis of intestine, part unspecified, without perforation or abscess without bleeding: Secondary | ICD-10-CM | POA: Diagnosis not present

## 2024-02-03 DIAGNOSIS — I10 Essential (primary) hypertension: Secondary | ICD-10-CM | POA: Diagnosis not present

## 2024-02-03 DIAGNOSIS — D72829 Elevated white blood cell count, unspecified: Secondary | ICD-10-CM | POA: Diagnosis not present

## 2024-02-03 DIAGNOSIS — K219 Gastro-esophageal reflux disease without esophagitis: Secondary | ICD-10-CM | POA: Diagnosis not present

## 2024-02-03 LAB — CBC WITH DIFFERENTIAL/PLATELET
Abs Immature Granulocytes: 0.13 K/uL — ABNORMAL HIGH (ref 0.00–0.07)
Basophils Absolute: 0.1 K/uL (ref 0.0–0.1)
Basophils Relative: 1 %
Eosinophils Absolute: 0 K/uL (ref 0.0–0.5)
Eosinophils Relative: 0 %
HCT: 25.8 % — ABNORMAL LOW (ref 36.0–46.0)
Hemoglobin: 8.1 g/dL — ABNORMAL LOW (ref 12.0–15.0)
Immature Granulocytes: 1 %
Lymphocytes Relative: 20 %
Lymphs Abs: 2.3 K/uL (ref 0.7–4.0)
MCH: 29.1 pg (ref 26.0–34.0)
MCHC: 31.4 g/dL (ref 30.0–36.0)
MCV: 92.8 fL (ref 80.0–100.0)
Monocytes Absolute: 0.9 K/uL (ref 0.1–1.0)
Monocytes Relative: 8 %
Neutro Abs: 8.2 K/uL — ABNORMAL HIGH (ref 1.7–7.7)
Neutrophils Relative %: 70 %
Platelets: 406 K/uL — ABNORMAL HIGH (ref 150–400)
RBC: 2.78 MIL/uL — ABNORMAL LOW (ref 3.87–5.11)
RDW: 19.9 % — ABNORMAL HIGH (ref 11.5–15.5)
WBC: 11.6 K/uL — ABNORMAL HIGH (ref 4.0–10.5)
nRBC: 0 % (ref 0.0–0.2)

## 2024-02-03 LAB — COMPREHENSIVE METABOLIC PANEL WITH GFR
ALT: 13 U/L (ref 0–44)
AST: 24 U/L (ref 15–41)
Albumin: 3.1 g/dL — ABNORMAL LOW (ref 3.5–5.0)
Alkaline Phosphatase: 74 U/L (ref 38–126)
Anion gap: 10 (ref 5–15)
BUN: 15 mg/dL (ref 8–23)
CO2: 24 mmol/L (ref 22–32)
Calcium: 9.5 mg/dL (ref 8.9–10.3)
Chloride: 110 mmol/L (ref 98–111)
Creatinine, Ser: 0.8 mg/dL (ref 0.44–1.00)
GFR, Estimated: >60 mL/min (ref >60)
Glucose, Bld: 115 mg/dL — ABNORMAL HIGH (ref 70–99)
Potassium: 4 mmol/L (ref 3.5–5.1)
Sodium: 144 mmol/L (ref 135–145)
Total Bilirubin: 0.6 mg/dL (ref 0.0–1.2)
Total Protein: 6.7 g/dL (ref 6.5–8.1)

## 2024-02-03 MED ORDER — METRONIDAZOLE 500 MG PO TABS
500.0000 mg | ORAL_TABLET | Freq: Three times a day (TID) | ORAL | 0 refills | Status: AC
Start: 2024-02-03 — End: 2024-02-10
  Filled 2024-02-03: qty 21, 7d supply, fill #0

## 2024-02-03 MED ORDER — HEPARIN SOD (PORK) LOCK FLUSH 100 UNIT/ML IV SOLN
500.0000 [IU] | Freq: Once | INTRAVENOUS | Status: AC
  Administered 2024-02-03: 500 [IU] via INTRAVENOUS
  Filled 2024-02-03: qty 5

## 2024-02-03 MED ORDER — CIPROFLOXACIN HCL 500 MG PO TABS
500.0000 mg | ORAL_TABLET | Freq: Two times a day (BID) | ORAL | 0 refills | Status: AC
  Filled 2024-02-03: qty 14, 7d supply, fill #0

## 2024-02-03 MED ORDER — LOSARTAN POTASSIUM 100 MG PO TABS
100.0000 mg | ORAL_TABLET | Freq: Every day | ORAL | 0 refills | Status: DC
  Filled 2024-02-03: qty 30, 30d supply, fill #0

## 2024-02-03 MED ORDER — CIPROFLOXACIN HCL 750 MG PO TABS
750.0000 mg | ORAL_TABLET | Freq: Two times a day (BID) | ORAL | 0 refills | Status: DC
  Filled 2024-02-03: qty 14, 7d supply, fill #0

## 2024-02-03 NOTE — Plan of Care (Signed)
 The patient continues on Cipro  750mg  and Flagyl  500mg  PO for Diverticulitis w/o s/s of adverse effects noted. The patient has been up to the [facility] with assist x 1. Trazadone 50mg  PRN given at bedtime for sleep per the patient's request.

## 2024-02-03 NOTE — Progress Notes (Signed)
 Occupational Therapy Treatment Patient Details Name: [name] MRN: [id number] DOB: February 14, 1946 Today's Date: 02/03/2024   History of present illness [name] is a 77yoF who comes [facility] 01/25[facility]24 c belly pain- reports decreased PO, annorexia since last chemo on 6/26. PMH: BrCA on chemotherapy, HTN, HLD, sleep apnea, diverticulosis of colon, chronic back pain. In ED BP 89/90mmHg. Pt admitted with diagnosis of acute diverticulitis.   OT comments  Chart reviewed to date, pt greeted in chair, agreeable to OT tx session targeting improving functional activity tolerance in prep for ADL tasks. Improvements noted on this date with pt performing STS with SPC with supervision-CGA, amb to bathroom with supervision-CGA, toileting with MIN A for thoroughness, amb approx 200' with SPC with supervision-CGA. HR 127 bpm after pt returns to chair. Educated pt re: safer ADL completion with AE/DME. Pt is making progress towards, discharge recommendation appropriate.       If plan is discharge home, recommend the following:  A little help with walking and/or transfers;A little help with bathing/dressing/bathroom;Assistance with cooking/housework;Assist for transportation   Equipment Recommendations  BSC/3in1    Recommendations for Other Services      Precautions / Restrictions Precautions Precautions: Fall Recall of Precautions/Restrictions: Intact Restrictions Weight Bearing Restrictions Per Provider Order: No       Mobility Bed Mobility               General bed mobility comments: NT in recliner pre/post session    Transfers Overall transfer level: Needs assistance Equipment used: Straight cane Transfers: Sit to/from Stand Sit to Stand: Supervision                 Balance Overall balance assessment: Needs assistance Sitting-balance support: Feet supported Sitting balance-Leahy Scale: Good     Standing balance support: During functional activity, Single extremity  supported Standing balance-Leahy Scale: Good                             ADL either performed or assessed with clinical judgement   ADL Overall ADL's : Needs assistance/impaired     Grooming: Wash/dry hands;Standing;Supervision/safety Grooming Details (indicate cue type and reason): at sink level                 Toilet Transfer: Supervision/safety;Contact guard Optician, dispensing (with SPC)   Toileting- Clothing Manipulation and Hygiene: Minimal assistance Toileting - Clothing Manipulation Details (indicate cue type and reason): for thoroughess     Functional mobility during ADLs: Supervision/safety;Contact guard assist (approx 200' with [facility])      Extremity/Trunk Assessment              Vision       Haematologist Communication Communication: No apparent difficulties   Cognition Arousal: Alert Behavior During Therapy: WFL for tasks assessed/performed Cognition: No apparent impairments                               Following commands: Intact        Cueing   Cueing Techniques: Verbal cues  Exercises Other Exercises Other Exercises: edu re: role of OT, role of rehab, safe ADL completion with AD/DME, discharge recommendations    Shoulder Instructions       General Comments spo2 >90% on RA throughout, HR max 127 bpm after amb    Pertinent Vitals/ Pain  Pain Assessment Pain Assessment: No/denies pain  Home Living                                          Prior Functioning/Environment              Frequency  Min 2X/week        Progress Toward Goals  OT Goals(current goals can now be found in the care plan section)  Progress towards OT goals: Progressing toward goals  Acute Rehab OT Goals Time For Goal Achievement: 02/10/24  Plan      Co-evaluation                 AM-PAC OT 6 Clicks Daily Activity     Outcome Measure   Help from  another person eating meals?: None Help from another person taking care of personal grooming?: None Help from another person toileting, which includes using toliet, bedpan, or urinal?: A Little Help from another person bathing (including washing, rinsing, drying)?: A Little Help from another person to put on and taking off regular upper body clothing?: None Help from another person to put on and taking off regular lower body clothing?: A Little 6 Click Score: 21    End of Session Equipment Utilized During Treatment: Other (comment) (SPC)  OT Visit Diagnosis: Unsteadiness on feet (R26.81);Muscle weakness (generalized) (M62.81)   Activity Tolerance Patient tolerated treatment well   Patient Left in chair;with call bell/phone within reach;with chair alarm set;with nursing/sitter in room   Nurse Communication          Time: 8972-8956 OT Time Calculation (min): 16 min  Charges: OT General Charges $OT Visit: 1 Visit OT Treatments $Self Care/Home Management : 8-22 mins  Therisa Sheffield, OTD OTR/L  02/03/24, 11:05 AM

## 2024-02-03 NOTE — Progress Notes (Signed)
 Date of Admission:  01/25/2024      ID: [name] is a 78 y.o. female  Principal Problem:   Acute diverticulitis Active Problems:   Hyperlipidemia LDL goal <100   Essential hypertension   GERD    Subjective: Says she is feeling better No pain abdomen Had breakfast No nausea or vomiting  Medications:   Chlorhexidine  Gluconate Cloth  6 each Topical Daily   ciprofloxacin   750 mg Oral BID   enoxaparin  (LOVENOX ) injection  40 mg Subcutaneous Q24H   feeding supplement  237 mL Oral BID BM   gabapentin   100 mg Oral QHS   losartan   100 mg Oral Daily   metoprolol  succinate  50 mg Oral Daily   metroNIDAZOLE   500 mg Oral Q8H   multivitamin with minerals  1 tablet Oral Daily   pantoprazole   40 mg Oral BID AC   sodium chloride  flush  10-40 mL Intracatheter Q12H    Objective: Vital signs in last 24 hours: Patient Vitals for the past 24 hrs:  BP Temp Pulse Resp SpO2  02/03/24 0748 (!) 148/71 98.7 F (37.1 C) 91 16 100 %  02/03/24 0512 (!) 116/57 98.2 F (36.8 C) 95 18 100 %  02/02/24 2004 (!) 175/83 (!) 97.3 F (36.3 C) 95 19 100 %       PHYSICAL EXAM:  General: Alert, cooperative, no distress, appears stated age.  Lungs: Clear to auscultation bilaterally. No Wheezing or Rhonchi. No rales. Heart: Regular rate and rhythm, no murmur, rub or gallop. Abdomen: Soft, non-tender,not distended. Bowel sounds normal. No masses Extremities: atraumatic, no cyanosis. No edema. No clubbing Skin: No rashes or lesions. Or bruising Lymph: Cervical, supraclavicular normal. Neurologic: Grossly non-focal  Lab Results    Latest Ref Rng & Units 02/03/2024   11:03 AM 02/02/2024    5:17 AM 02/01/2024    5:13 AM  CBC  WBC 4.0 - 10.5 K/uL 11.6  11.5  15.6   Hemoglobin 12.0 - 15.0 g/dL 8.1  7.2  7.4   Hematocrit 36.0 - 46.0 % 25.8  22.7  22.8   Platelets 150 - 400 K/uL 406  373  362        Latest Ref Rng & Units 02/02/2024    5:17 AM 02/01/2024    5:13 AM 01/31/2024    6:54 AM  CMP   Glucose 70 - 99 mg/dL 87  838  898   BUN 8 - 23 mg/dL 15  15  11    Creatinine 0.44 - 1.00 mg/dL 8.85  8.98  8.78   Sodium 135 - 145 mmol/L 146  144  142   Potassium 3.5 - 5.1 mmol/L 3.7  3.7  3.8   Chloride 98 - 111 mmol/L 111  110  111   CO2 22 - 32 mmol/L 25  25  26    Calcium  8.9 - 10.3 mg/dL 8.9  8.7  9.0   Total Protein 6.5 - 8.1 g/dL 5.5  5.9  5.9   Total Bilirubin 0.0 - 1.2 mg/dL 0.6  0.2  0.8   Alkaline Phos 38 - 126 U/L 64  76  85   AST 15 - 41 U/L 19  22  23    ALT 0 - 44 U/L 15  17  19        Microbiology: 7/11 BC NG    Assessment/Plan: Sigmoid diverticulitis :  Fluctuating leucocytosis- repeat Ct not showing any abscess or perforation- dc ing zosyn  and changing to augmentin  caused an  increase in leucocytosis- so changed back to zosyn  with improvement over the weekend.. now patient is on cipro  and flagyl -  leucocytosis declining /stable  Is going home on cipro - the dose should be 500mg  BID and flagyl  500mg  BID X 7 days Discussed the side effects of both meds with patient and asked her to call me if she experiences any muscle ligament/tendon pain, confusion diarrhea.    Ca breast s/p lumpectomy and receiving chemo- last dose on 6/26 Also got long acting pegfilgrastim    Anemia   AkI       Discussed the management with the patient and hospitalist ?

## 2024-02-03 NOTE — Discharge Summary (Signed)
 DISCHARGE SUMMARY    [name] FMW:[id number] DOB: 1946/04/13 DOA: 01/25/2024  PCP: Antonetta Rollene BRAVO, MD  Admit date: 01/25/2024 Discharge date: 02/03/2024   Recommendations for Outpatient Follow-up:  Follow up with PCP in 1-2 weeks to recheck kidney function and blood pressure  Hospital Course: [name] is a 78 year old female with breast cancer on chemotherapy, hypertension, hyperlipidemia, GERD, diverticulosis, who presents with abdominal pain, nausea, diarrhea.  Patient's most recent chemotherapy was 01/15/2024.  She denies blood or mucus in her stool.  CT revealed sigmoid diverticulosis with wall thickening, mucosal hyperenhancement, adjacent fat stranding.  No lymphadenopathy or metastatic disease seen in abdomen pelvis.  Patient was started on Zosyn  and admitted.  Stay was complicated by persistent leukocytosis and AKI.    Acute diverticulitis without perforation or abscess - Remains afebrile without pain. - Zosyn  discontinued and transition to Augmentin , patient then had acute worsening with vomiting, rising leukocytosis, worsening tachycardia.  She was transition back to Zosyn  on 7/11. (Last dose Zosyn  0600 on 7/10.  Augmentin  initiated 7/10.  Status post 3 doses--> back to zosyn  7/11) - WBC declining, tachycardia improving.  Zosyn  was discontinued after 7 days of therapy and sensation to Cipro  Flagyl .  Patient is doing better on this regimen - Blood cultures remain negative - Continue Cipro  Flagyl  for 7 days at discharge - No other source of infection.  Surgical site, port site looks clean and healing well.  CXR without infiltrate. - Rpt CT scan without abscess formation.  Redemonstrates known diverticulitis -Patient did receive pegfilgrastim  on 6/29.  Which has possible side effect of leukocytosis.  This may be contributing.  Though patient symptoms do correspond with diverticulitis - Infectious disease consulted, agrees with above recommendations.   Rising  leukocytosis Tachycardia - Infectious workup as above. - On home dose metoprolol .  Tachycardia improving now - Continue to monitor closely - Dopplers negative for DVT   Hypertension - Pressure has been very labile during this admission.  Have de-prescribed antihypertensives at discharge.  Recommend close outpatient follow-up with PCP and blood pressure monitoring    Hyperlipidemia - Resume statin at discharge   GERD - Continue PPI   AKI - baseline creatinine close to 0.9, up to 1.36 down trended to 0.63. - Status post IV fluids again 7/12.  Cr is improving. - Encourage oral rehydration -- PCP follow up for monitoring   Normocytic anemia - Hemoglobin remained stable - Elevated ferritin and TSAT, decreased TIBC.  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  Care plan discussed directly with the patient as well as with her son.  Medications were sent directly to the patient's hospital bed prior to discharge.  Discharge Instructions  Discharge Instructions     Call MD for:  difficulty breathing, headache or visual disturbances   Complete by: As directed    Call MD for:  persistant dizziness or light-headedness   Complete by: As directed    Call MD for:  persistant nausea and vomiting   Complete by: As directed    Call MD for:  severe uncontrolled pain   Complete by: As directed    Call MD for:  temperature >100.4   Complete by: As directed    Diet general   Complete by: As directed  Discharge instructions   Complete by: As directed    Your blood pressure was both very good and Low throughout this admission. We have made some changes in your blood pressure medications moving forward.  Please review your home medication list  closely.  We are sending you home on two antibiotics which you will need to complete over the next 1 week.  Follow up with your primary care physician to discuss the medication changes during this admission and recheck your kidney function.   Increase activity slowly   Complete by: As directed       Allergies as of 02/03/2024   No Known Allergies      Medication List     STOP taking these medications    amLODipine  10 MG tablet Commonly known as: NORVASC    cloNIDine  0.2 MG tablet Commonly known as: CATAPRES    dexamethasone  10 mg in sodium chloride  0.9 % 50 mL   furosemide  40 MG tablet Commonly known as: LASIX    losartan -hydrochlorothiazide  100-25 MG tablet Commonly known as: HYZAAR   oxyCODONE  5 MG immediate release tablet Commonly known as: Roxicodone    potassium chloride  SA 20 MEQ tablet Commonly known as: KLOR-CON  M       TAKE these medications    albuterol  108 (90 Base) MCG/ACT inhaler Commonly known as: VENTOLIN  HFA Inhale 2 puffs into the lungs every 6 (six) hours as needed for wheezing or shortness of breath.   ALOXI  IV Inject into the vein.   aspirin EC 81 MG tablet Take 81 mg by mouth daily. Swallow whole.   atorvastatin  20 MG tablet Commonly known as: LIPITOR Take 1 tablet (20 mg total) by mouth daily.   BIOTIN PO Take 1 tablet by mouth daily.   bisacodyl  5 MG EC tablet Generic drug: bisacodyl  Take one tablet by mouth every 3 days as needed, for constipation. Repeat once after 6 hours if still no bowel movement   ciprofloxacin  750 MG tablet Commonly known as: CIPRO  Take 1 tablet (750 mg total) by mouth 2 (two) times daily for 7 days.   clotrimazole -betamethasone  cream Commonly known as: LOTRISONE  Apply cream twice daily to rash in groin for 10 days, then as needed   CYTOXAN  IJ Inject as directed.   docusate sodium  100 MG capsule Commonly known as: Colace Take 1 capsule (100 mg total) by mouth 2 (two) times daily.   famotidine 10  MG tablet Commonly known as: PEPCID Take 10 mg by mouth 2 (two) times daily.   gabapentin  100 MG capsule Commonly known as: NEURONTIN  Take 1 capsule (100 mg total) by mouth at bedtime. What changed: Another medication with the same name was removed. Continue taking this medication, and follow the directions you see here.   Gerhardt's butt cream Crea Apply 1 application topically 3 (three) times daily.   lidocaine -prilocaine  cream Commonly known as: EMLA  Apply to affected area once   losartan  100 MG tablet Commonly known as: COZAAR  Take 1 tablet (100 mg total) by mouth daily. Start taking on: February 04, 2024   magnesium  30 MG tablet Take one tablet by mouth once daily   megestrol  400 MG/10ML suspension Commonly known as: MEGACE  Take 10 mLs (400 mg total) by mouth 2 (two) times daily.   Melatonin 3 MG Caps Take 1 capsule (3 mg total) by mouth at bedtime.   metoprolol  succinate 50 MG 24 hr tablet Commonly known as: TOPROL -XL TAKE 1 TABLET BY MOUTH  DAILY WITH OR IMMEDIATELY  FOLLOWING A MEAL   metroNIDAZOLE   500 MG tablet Commonly known as: FLAGYL  Take 1 tablet (500 mg total) by mouth every 8 (eight) hours for 7 days.   olopatadine  0.1 % ophthalmic solution Commonly known as: PATANOL Place 1 drop into both eyes 2 (two) times daily.   omeprazole  20 MG capsule Commonly known as: PRILOSEC Take 1 capsule (20 mg total) by mouth daily.   ondansetron  4 MG tablet Commonly known as: Zofran  Take 1 tablet (4 mg total) by mouth daily as needed for nausea or vomiting.   polyethylene glycol powder 17 GM/SCOOP powder Commonly known as: GLYCOLAX /MIRALAX  Take 17 g by mouth 2 (two) times daily as needed.   prochlorperazine  10 MG tablet Commonly known as: COMPAZINE  Take 1 tablet (10 mg total) by mouth every 6 (six) hours as needed for nausea or vomiting.   pyridOXINE  100 MG tablet Commonly known as: VITAMIN B6 Take 200 mg by mouth daily.   sertraline  50 MG tablet Commonly known  as: ZOLOFT  Take 1 tablet (50 mg total) by mouth daily.   TAXOTERE  IV Inject into the vein.   temazepam  15 MG capsule Commonly known as: RESTORIL  Take 1 capsule (15 mg total) by mouth at bedtime as needed for sleep.   Vitamin D3 125 MCG (5000 UT) Caps Take 1 capsule (5,000 Units total) by mouth daily.        Follow-up Information     Antonetta Rollene BRAVO, MD Follow up.   Specialty: Family Medicine Why: Hospital follow up Contact information: [address] [place] [address] [phone]         Care, [facility] Follow up.   Why: 07/08---Per [facility] has accepted patient for home health PT/OT. Contact information: [address] [place] [address] [phone]                No Known Allergies  Consultations: Treatment Team:  Fayette Bodily, MD   Procedures/Studies: DG Chest Port 1 View Result Date: 01/31/2024 CLINICAL DATA:  [id number] Dyspnea [id number] EXAM: PORTABLE CHEST 1 VIEW COMPARISON:  October 22, 2023 FINDINGS: The cardiomediastinal silhouette is unchanged in contour.Chest port with tip terminating over the confluence of the LEFT brachiocephalic vein and SVC. Atherosclerotic calcifications. No pleural effusion. No pneumothorax. Similar but favored mildly improved perihilar vascular congestion compared to most recent prior. Mild background of interstitial prominence, improved since prior. Gaseous distension of a loop of bowel in the LEFT abdomen. Unchanged elevation of the RIGHT hemidiaphragm. IMPRESSION: Persistent but favored mildly improved pulmonary edema. Electronically Signed   By: Corean Salter M.D.   On: 01/31/2024 12:06   CT ABDOMEN PELVIS W CONTRAST Result Date: 01/30/2024 CLINICAL DATA:  Elevated white count.  Possible diverticulitis. EXAM: CT ABDOMEN AND PELVIS WITH CONTRAST TECHNIQUE: Multidetector CT imaging of the abdomen and pelvis was performed using the standard protocol following  bolus administration of intravenous contrast. RADIATION DOSE REDUCTION: This exam was performed according to the departmental dose-optimization program which includes automated exposure control, adjustment of the mA and/or kV according to patient size and/or use of iterative reconstruction technique. CONTRAST:  100mL OMNIPAQUE  IOHEXOL  300 MG/ML SOLN, 30mL OMNIPAQUE  IOHEXOL  300 MG/ML SOLN COMPARISON:  January 25, 2024. FINDINGS: Lower chest: No acute abnormality. Hepatobiliary: No focal liver abnormality is seen. No gallstones, gallbladder wall thickening, or biliary dilatation. Pancreas: Unremarkable. No pancreatic ductal dilatation or surrounding inflammatory changes. Spleen: Normal in size without focal abnormality. Adrenals/Urinary Tract: Adrenal glands are unremarkable. Kidneys are normal, without renal calculi, focal lesion, or  hydronephrosis. Bladder is unremarkable. Stomach/Bowel: Small sliding-type hiatal hernia. Appendix is unremarkable. No evidence of bowel obstruction. Sigmoid diverticulosis is noted with moderate wall thickening and mild inflammatory changes suggesting possible sigmoid diverticulitis. No definite abscess formation is noted. Vascular/Lymphatic: Aortic atherosclerosis. No enlarged abdominal or pelvic lymph nodes. Reproductive: Status post hysterectomy. No adnexal masses. Other: No ascites or hernia is noted. Musculoskeletal: No acute or significant osseous findings. IMPRESSION: Probable mild sigmoid diverticulitis. No definite abscess formation is noted. Small sliding-type hiatal hernia. Aortic Atherosclerosis (ICD10-I70.0). Electronically Signed   By: Lynwood Landy Raddle M.D.   On: 01/30/2024 15:25   US  Venous Img Lower Bilateral (DVT) Result Date: 01/28/2024 CLINICAL DATA:  Tachycardia.  BILATERAL lower extremity edema. EXAM: Bilateral Lower Extremity Venous Doppler Ultrasound TECHNIQUE: Gray-scale sonography with compression, as well as color and duplex ultrasound, were performed to evaluate  the deep venous system(s) from the level of the common femoral vein through the popliteal and proximal calf veins. COMPARISON:  None available FINDINGS: VENOUS Normal compressibility of the common femoral, superficial femoral, and popliteal veins, as well as the visualized calf veins. Visualized portions of profunda femoral vein and great saphenous vein unremarkable. No filling defects to suggest DVT on grayscale or color Doppler imaging. Doppler waveforms show normal direction of venous flow, normal respiratory plasticity and response to augmentation. OTHER None. Limitations: Limited visualization of the RIGHT peroneal vein. IMPRESSION: No lower extremity DVT. Electronically Signed   By: Aliene Lloyd M.D.   On: 01/28/2024 16:23   CT ABDOMEN PELVIS W CONTRAST Result Date: 01/25/2024 CLINICAL DATA:  Abdominal pain, breast cancer on chemotherapy * Tracking Code: BO * EXAM: CT ABDOMEN AND PELVIS WITH CONTRAST TECHNIQUE: Multidetector CT imaging of the abdomen and pelvis was performed using the standard protocol following bolus administration of intravenous contrast. RADIATION DOSE REDUCTION: This exam was performed according to the departmental dose-optimization program which includes automated exposure control, adjustment of the mA and/or kV according to patient size and/or use of iterative reconstruction technique. CONTRAST:  OMNIPAQUE  IOHEXOL  300 MG/ML  SOLN COMPARISON:  PET-CT, 09/25/2022 FINDINGS: Lower chest: No acute abnormality. Mild bibasilar pulmonary fibrosis. Small hiatal hernia. Hepatobiliary: No solid liver abnormality is seen. No gallstones, gallbladder wall thickening, or biliary dilatation. Pancreas: Unremarkable. No pancreatic ductal dilatation or surrounding inflammatory changes. Spleen: Normal in size without significant abnormality. Adrenals/Urinary Tract: Adrenal glands are unremarkable. Kidneys are normal, without renal calculi, solid lesion, or hydronephrosis. Bladder is unremarkable.  Stomach/Bowel: Stomach is within normal limits. Appendix appears normal. Sigmoid diverticulosis with wall thickening, mucosal hyperenhancement, and adjacent fat stranding Vascular/Lymphatic: Aortic atherosclerosis. No enlarged abdominal or pelvic lymph nodes. Reproductive: No mass or other significant abnormality. Other: Small broad-base fat containing midline ventral hernia. No ascites. Musculoskeletal: No acute or significant osseous findings. IMPRESSION: 1. Sigmoid diverticulosis with wall thickening, mucosal hyperenhancement, and adjacent fat stranding, consistent with acute uncomplicated diverticulitis. 2. No evidence of lymphadenopathy or metastatic disease in the abdomen or pelvis. 3. Mild bibasilar pulmonary fibrosis. Aortic Atherosclerosis (ICD10-I70.0). Electronically Signed   By: Marolyn JONETTA Jaksch M.D.   On: 01/25/2024 16:48      Discharge Exam: Vitals:   02/03/24 0512 02/03/24 0748  BP: (!) 116/57 (!) 148/71  Pulse: 95 91  Resp: 18 16  Temp: 98.2 F (36.8 C) 98.7 F (37.1 C)  SpO2: 100% 100%   Vitals:   02/02/24 0837 02/02/24 2004 02/03/24 0512 02/03/24 0748  BP: 139/71 (!) 175/83 (!) 116/57 (!) 148/71  Pulse: 93 95 95 91  Resp:  16 19 18 16   Temp: 98.2 F (36.8 C) (!) 97.3 F (36.3 C) 98.2 F (36.8 C) 98.7 F (37.1 C)  TempSrc: Oral     SpO2: 100% 100% 100% 100%  Weight:      Height:        Constitutional:  Normal appearance. Non toxic-appearing.  HENT: Head Normocephalic and atraumatic.  Mucous membranes are moist.  Eyes:  Extraocular intact. Conjunctivae normal.  Cardiovascular: Rate and Rhythm: Normal rate and regular rhythm.  Pulmonary: Non labored, symmetric rise of chest wall.  Skin: warm and dry. not jaundiced.  Neurological: No focal deficit present. alert. Oriented.  Psychiatric: Mood and Affect congruent.    The results of significant diagnostics from this hospitalization (including imaging, microbiology, ancillary and laboratory) are listed below for  reference.     Microbiology: Recent Results (from the past 240 hours)  Culture, blood (Routine X 2) w Reflex to ID Panel     Status: None (Preliminary result)   Collection Time: 01/30/24  6:03 PM   Specimen: BLOOD LEFT ARM  Result Value Ref Range Status   Specimen Description BLOOD LEFT ARM  Final   Special Requests   Final    BOTTLES DRAWN AEROBIC AND ANAEROBIC Blood Culture adequate volume   Culture   Final    NO GROWTH 4 DAYS Performed at [facility], [address] [place], [place] [address]    Report Status PENDING  Incomplete  Culture, blood (Routine X 2) w Reflex to ID Panel     Status: None (Preliminary result)   Collection Time: 01/30/24  6:12 PM   Specimen: BLOOD  Result Value Ref Range Status   Specimen Description BLOOD BLOOD LEFT HAND  Final   Special Requests   Final    BOTTLES DRAWN AEROBIC AND ANAEROBIC Blood Culture adequate volume   Culture   Final    NO GROWTH 4 DAYS Performed at [facility], [address] [place], [place] [address]    Report Status PENDING  Incomplete     Labs: BNP (last 3 results) No results for input(s): BNP in the last 8760 hours. Basic Metabolic Panel: Recent Labs  Lab 01/29/24 0452 01/30/24 0400 01/31/24 0654 02/01/24 0513 02/02/24 0517  NA 141 143 142 144 146*  K 3.4* 3.6 3.8 3.7 3.7  CL 106 108 111 110 111  CO2 22 23 26 25 25   GLUCOSE 109* 114* 101* 161* 87  BUN 8 9 11 15 15   CREATININE 1.07* 0.63 1.21* 1.01* 1.14*  CALCIUM  8.9 9.0 9.0 8.7* 8.9  MG 1.5* 1.7 1.6* 1.6* 1.7  PHOS 4.1 2.9 3.9 3.7 4.3   Liver Function Tests: Recent Labs  Lab 01/29/24 0452 01/30/24 0400 01/31/24 0654 02/01/24 0513 02/02/24 0517  AST 30 29 23 22 19   ALT 24 24 19 17 15   ALKPHOS 108 105 85 76 64  BILITOT 0.6 0.5 0.8 0.2 0.6  PROT 6.4* 6.6 5.9* 5.9* 5.5*  ALBUMIN 2.7* 2.9* 2.7* 2.6* 2.4*   No results for input(s): LIPASE, AMYLASE in the last 168 hours. No results for input(s): AMMONIA in the last  168 hours. CBC: Recent Labs  Lab 01/30/24 0400 01/31/24 0654 02/01/24 0513 02/02/24 0517 02/03/24 1103  WBC 21.8* 18.3* 15.6* 11.5* 11.6*  NEUTROABS 16.6* 13.8* 11.7* 7.9* 8.2*  HGB 8.3* 7.7* 7.4* 7.2* 8.1*  HCT 25.1* 23.9* 22.8* 22.7* 25.8*  MCV 89.0 89.5 91.2 91.5 92.8  PLT 409* 394 362 373 406*   Cardiac Enzymes:  No results for input(s): CKTOTAL, CKMB, CKMBINDEX, TROPONINI in the last 168 hours. BNP: Invalid input(s): POCBNP CBG: No results for input(s): GLUCAP in the last 168 hours. D-Dimer No results for input(s): DDIMER in the last 72 hours. Hgb A1c No results for input(s): HGBA1C in the last 72 hours. Lipid Profile No results for input(s): CHOL, HDL, LDLCALC, TRIG, CHOLHDL, LDLDIRECT in the last 72 hours. Thyroid  function studies No results for input(s): TSH, T4TOTAL, T3FREE, THYROIDAB in the last 72 hours.  Invalid input(s): FREET3 Anemia work up No results for input(s): VITAMINB12, FOLATE, FERRITIN, TIBC, IRON, RETICCTPCT in the last 72 hours. Urinalysis    Component Value Date/Time   COLORURINE YELLOW (A) 01/25/2024 1744   APPEARANCEUR CLEAR (A) 01/25/2024 1744   APPEARANCEUR Clear 10/14/2023 1705   LABSPEC 1.015 01/25/2024 1744   PHURINE 6.0 01/25/2024 1744   GLUCOSEU NEGATIVE 01/25/2024 1744   HGBUR NEGATIVE 01/25/2024 1744   HGBUR trace-intact 11/07/2009 1057   BILIRUBINUR NEGATIVE 01/25/2024 1744   BILIRUBINUR Negative 10/14/2023 1705   KETONESUR NEGATIVE 01/25/2024 1744   PROTEINUR NEGATIVE 01/25/2024 1744   UROBILINOGEN 2.0 10/25/2014 1142   UROBILINOGEN 1.0 11/07/2009 1057   NITRITE NEGATIVE 01/25/2024 1744   LEUKOCYTESUR NEGATIVE 01/25/2024 1744   Sepsis Labs Recent Labs  Lab 01/31/24 0654 02/01/24 0513 02/02/24 0517 02/03/24 1103  WBC 18.3* 15.6* 11.5* 11.6*   Microbiology Recent Results (from the past 240 hours)  Culture, blood (Routine X 2) w Reflex to ID Panel     Status: None  (Preliminary result)   Collection Time: 01/30/24  6:03 PM   Specimen: BLOOD LEFT ARM  Result Value Ref Range Status   Specimen Description BLOOD LEFT ARM  Final   Special Requests   Final    BOTTLES DRAWN AEROBIC AND ANAEROBIC Blood Culture adequate volume   Culture   Final    NO GROWTH 4 DAYS Performed at [facility], [address] [place], [place] [address]    Report Status PENDING  Incomplete  Culture, blood (Routine X 2) w Reflex to ID Panel     Status: None (Preliminary result)   Collection Time: 01/30/24  6:12 PM   Specimen: BLOOD  Result Value Ref Range Status   Specimen Description BLOOD BLOOD LEFT HAND  Final   Special Requests   Final    BOTTLES DRAWN AEROBIC AND ANAEROBIC Blood Culture adequate volume   Culture   Final    NO GROWTH 4 DAYS Performed at [facility], [address], [place], [place] [address]    Report Status PENDING  Incomplete     Time coordinating discharge: 32 min   SIGNED: Lorane Poland, MD  [facility] 02/03/2024, 1:35 PM Pager   If 7PM-7AM, please contact night-coverage

## 2024-02-04 ENCOUNTER — Telehealth: Payer: Self-pay

## 2024-02-04 LAB — CULTURE, BLOOD (ROUTINE X 2)
Culture: NO GROWTH
Culture: NO GROWTH
Special Requests: ADEQUATE
Special Requests: ADEQUATE

## 2024-02-04 NOTE — Transitions of Care (Post Inpatient/ED Visit) (Unsigned)
   02/04/2024  Name: [name] MRN: [id number] DOB: 05/11/1946  Today's TOC FU Call Status: Today's TOC FU Call Status:: Unsuccessful Call (1st Attempt) Unsuccessful Call (1st Attempt) Date: 02/04/24  Attempted to reach the patient regarding the most recent Inpatient/ED visit.  Follow Up Plan: Additional outreach attempts will be made to reach the patient to complete the Transitions of Care (Post Inpatient/ED visit) call.   Signature Julian Lemmings, LPN [facility] Nurse Health Advisor Direct Dial [phone]

## 2024-02-05 ENCOUNTER — Inpatient Hospital Stay: Attending: Hematology

## 2024-02-05 ENCOUNTER — Inpatient Hospital Stay

## 2024-02-05 ENCOUNTER — Inpatient Hospital Stay: Admitting: Hematology

## 2024-02-05 DIAGNOSIS — C50311 Malignant neoplasm of lower-inner quadrant of right female breast: Secondary | ICD-10-CM

## 2024-02-05 DIAGNOSIS — Z5189 Encounter for other specified aftercare: Secondary | ICD-10-CM | POA: Diagnosis not present

## 2024-02-05 DIAGNOSIS — Z5111 Encounter for antineoplastic chemotherapy: Secondary | ICD-10-CM | POA: Diagnosis not present

## 2024-02-05 DIAGNOSIS — Z17421 Hormone receptor negative with human epidermal growth factor receptor 2 negative status: Secondary | ICD-10-CM | POA: Insufficient documentation

## 2024-02-05 DIAGNOSIS — Z171 Estrogen receptor negative status [ER-]: Secondary | ICD-10-CM

## 2024-02-05 LAB — MAGNESIUM: Magnesium: 1.9 mg/dL (ref 1.7–2.4)

## 2024-02-05 LAB — COMPREHENSIVE METABOLIC PANEL WITH GFR
ALT: 19 U/L (ref 0–44)
AST: 36 U/L (ref 15–41)
Albumin: 3 g/dL — ABNORMAL LOW (ref 3.5–5.0)
Alkaline Phosphatase: 66 U/L (ref 38–126)
Anion gap: 9 (ref 5–15)
BUN: 11 mg/dL (ref 8–23)
CO2: 23 mmol/L (ref 22–32)
Calcium: 9 mg/dL (ref 8.9–10.3)
Chloride: 112 mmol/L — ABNORMAL HIGH (ref 98–111)
Creatinine, Ser: 0.8 mg/dL (ref 0.44–1.00)
GFR, Estimated: >60 mL/min (ref >60)
Glucose, Bld: 109 mg/dL — ABNORMAL HIGH (ref 70–99)
Potassium: 3.5 mmol/L (ref 3.5–5.1)
Sodium: 144 mmol/L (ref 135–145)
Total Bilirubin: 0.7 mg/dL (ref 0.0–1.2)
Total Protein: 6.7 g/dL (ref 6.5–8.1)

## 2024-02-05 LAB — CBC WITH DIFFERENTIAL/PLATELET
Abs Immature Granulocytes: 0.06 K/uL (ref 0.00–0.07)
Basophils Absolute: 0.1 K/uL (ref 0.0–0.1)
Basophils Relative: 1 %
Eosinophils Absolute: 0.1 K/uL (ref 0.0–0.5)
Eosinophils Relative: 1 %
HCT: 28.1 % — ABNORMAL LOW (ref 36.0–46.0)
Hemoglobin: 8.7 g/dL — ABNORMAL LOW (ref 12.0–15.0)
Immature Granulocytes: 1 %
Lymphocytes Relative: 22 %
Lymphs Abs: 2.2 K/uL (ref 0.7–4.0)
MCH: 30.1 pg (ref 26.0–34.0)
MCHC: 31 g/dL (ref 30.0–36.0)
MCV: 97.2 fL (ref 80.0–100.0)
Monocytes Absolute: 0.9 K/uL (ref 0.1–1.0)
Monocytes Relative: 8 %
Neutro Abs: 6.8 K/uL (ref 1.7–7.7)
Neutrophils Relative %: 67 %
Platelets: 404 K/uL — ABNORMAL HIGH (ref 150–400)
RBC: 2.89 MIL/uL — ABNORMAL LOW (ref 3.87–5.11)
RDW: 21.5 % — ABNORMAL HIGH (ref 11.5–15.5)
Smear Review: NORMAL
WBC: 10.1 K/uL (ref 4.0–10.5)
nRBC: 0.2 % (ref 0.0–0.2)

## 2024-02-05 NOTE — Transitions of Care (Post Inpatient/ED Visit) (Unsigned)
   02/05/2024  Name: [name] MRN: [id number] DOB: 09/18/1945  Today's TOC FU Call Status: Today's TOC FU Call Status:: Unsuccessful Call (2nd Attempt) Unsuccessful Call (1st Attempt) Date: 02/04/24 Unsuccessful Call (2nd Attempt) Date: 02/05/24  Attempted to reach the patient regarding the most recent Inpatient/ED visit.  Follow Up Plan: Additional outreach attempts will be made to reach the patient to complete the Transitions of Care (Post Inpatient/ED visit) call.   Signature Julian Lemmings, LPN [facility] Nurse Health Advisor Direct Dial [phone]

## 2024-02-05 NOTE — Progress Notes (Signed)
 Patient presents today for Docetaxol/Cytoxan  and onpro per providers order.  Vital signs and labs reviewed by MD.  Message received from Isaiah Piety RN/Dr. Rogers patients treatment being held today.

## 2024-02-05 NOTE — Patient Instructions (Signed)
  [facility] Discharge Instructions   You were seen and examined today by Dr. Rogers.  He reviewed the results of your lab work which are normal/stable.   We will hold your treatment today since you are still on antibiotics.   Return as scheduled.    Thank you for choosing [facility] at [facility] to provide your oncology and hematology care.  To afford each patient quality time with our provider, please arrive at least 15 minutes before your scheduled appointment time.   If you have a lab appointment with the Cancer Center please come in thru the Main Entrance and check in at the main information desk.  You need to re-schedule your appointment should you arrive 10 or more minutes late.  We strive to give you quality time with our providers, and arriving late affects you and other patients whose appointments are after yours.  Also, if you no show three or more times for appointments you may be dismissed from the clinic at the providers discretion.     Again, thank you for choosing [facility]  Our hope is that these requests will decrease the amount of time that you wait before being seen by our physicians.       _____________________________________________________________  Should you have questions after your visit to [facility], please contact our office at [phone] and follow the prompts.  Our office hours are 8:00 a.m. and 4:30 p.m. Monday - Friday.  Please note that voicemails left after 4:00 p.m. may not be returned until the following business day.  We are closed weekends and major holidays.  You do have access to a nurse 24-7, just call the main number to the clinic [phone] and do not press any options, hold on the line and a nurse will answer the phone.    For prescription refill requests, have your pharmacy contact our office and allow 72 hours.    Due to Covid, you will need to  wear a mask upon entering the hospital. If you do not have a mask, a mask will be given to you at the Main Entrance upon arrival. For doctor visits, patients may have 1 support person age 12 or older with them. For treatment visits, patients can not have anyone with them due to social distancing guidelines and our immunocompromised population.

## 2024-02-05 NOTE — Progress Notes (Signed)
 [facility] [address] [place] [address]   Clinic Day:  02/05/2024  Referring physician: Antonetta Rollene BRAVO, MD  Patient Care Team: Antonetta Rollene BRAVO, MD as PCP - General Debera Jayson MATSU, MD as PCP - Cardiology (Cardiology) Harvey Margo CROME, MD (Inactive) as Consulting Physician (Gastroenterology) Rogers Hai, MD as Medical Oncologist (Medical Oncology) Celestia Joesph SQUIBB, RN as Oncology Nurse Navigator (Medical Oncology) Pappayliou, Dorothyann LABOR, DO as Consulting Physician (General Surgery)   ASSESSMENT & PLAN:   Assessment:  1.  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  0.5 cm right axillary lymph node which appears to have lost its fatty hilum and another right axillary lymph node with upper limits of normal cortical thickness 3.7 mm. - Right breast mass biopsy (09/09/2023): Invasive poorly differentiated ductal adenocarcinoma, grade 3, ER/PR negative, HER2 (0+), Ki 67: 30%.  Lymph node could not be biopsied as it was not visible on ultrasound. - PET scan (09/25/2023): Mild metabolic activity in the medial right breast with no evidence of metastatic disease. - Right breast lumpectomy and SLNB on 10/22/2023 - Pathology: 2.1 cm IDC, grade 3, with extensive necrosis, associated DCIS, intermediate grade, margins negative.  LVI negative.  0/7 sentinel lymph nodes involved. - Adjuvant chemotherapy with dose attenuated TC cycle 1 on 12/04/2023  2. Social/Family History: -Lives at home with her granddaughter. Retired from [employer]. No tobacco use. No ETOH consumption. No chemical exposures.  -2 Maternal aunts had breast cancer. Maternal uncle had prostate cancer. Maternal niece had breast cancer.   Plan:  1.  Stage IIa  (T2 N0 M0 G3 ER/PR/HER2-) TNBC of the right breast: - She received cycle 3 of dose attenuated TC on 01/15/2024. - Hospitalized from 01/25/2024 through 02/03/2019 for diverticulitis. - She is taking Cipro  and Flagyl .  She is still feeling tired. - Labs today: Normal LFTs and creatinine.  CBC shows normal white count and and platelet count with hemoglobin 8.7. - As she is recovering from recent hospitalization, will hold her chemotherapy today.  She is out of town next week.  Hence we will see her back on 02/16/2024 and completed her last cycle.   2.  Peripheral neuropathy: - Tingling in the fingers of both hands from carpal tunnel is stable.  No tingling in the legs.  3.  Decreased appetite: - Continue Megace  400 mg twice daily.   No orders of the defined types were placed in this encounter.     LILLETTE Verneta SAUNDERS Teague,acting as a Neurosurgeon for Hai Rogers, MD.,have documented all relevant documentation on the behalf of Hai Rogers, MD,as directed by  Hai Rogers, MD while in the presence of Hai Rogers, MD.  I, Hai Rogers MD, have reviewed the above documentation for accuracy and completeness, and I agree with the above.       Hai Rogers, MD   7/17/202512:58 PM  CHIEF COMPLAINT/PURPOSE OF CONSULT:   Diagnosis: Right breast triple negative breast cancer.   Cancer Staging  Breast cancer of lower-inner quadrant of right female breast [facility]) Staging form: Breast, AJCC 8th Edition - Pathologic stage from 11/19/2023: Stage IIA (pT2, pN0(sn), cM0, G3, ER-, PR-, HER2-) - Signed by Rogers Hai, MD on 11/19/2023    Prior Therapy: Right lumpectomy and SLNB  Current  Therapy: Adjuvant chemotherapy (TC)   HISTORY OF PRESENT ILLNESS:   Oncology History  Breast cancer of lower-inner quadrant of right female breast (HCC)  09/16/2023 Initial Diagnosis   Breast cancer of lower-inner quadrant of right female breast (HCC)   10/09/2023 Genetic  Testing   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  VUS in POLE at  p.T41M (c.122C>T).  Report date is 10/09/2023.   The CancerNext-Expanded gene panel offered by [facility] and includes sequencing, rearrangement, and RNA analysis for the following 76 genes: AIP, ALK, APC, ATM, AXIN2, BAP1, BARD1, BMPR1A, BRCA1, BRCA2, BRIP1, CDC73, CDH1, CDK4, CDKN1B, CDKN2A, CEBPA, CHEK2, CTNNA1, DDX41, DICER1, ETV6, FH, FLCN, GATA2, LZTR1, MAX, MBD4, MEN1, MET, MLH1, MSH2, MSH3, MSH6, MUTYH, NF1, NF2, NTHL1, PALB2, PHOX2B, PMS2, POT1, PRKAR1A, PTCH1, PTEN, RAD51C, RAD51D, RB1, RET, RUNX1, SDHA, SDHAF2, SDHB, SDHC, SDHD, SMAD4, SMARCA4, SMARCB1, SMARCE1, STK11, SUFU, TMEM127, TP53, TSC1, TSC2, VHL, and WT1 (sequencing and deletion/duplication); EGFR, HOXB13, KIT, MITF, PDGFRA, POLD1, and POLE (sequencing only); EPCAM and GREM1 (deletion/duplication only).    11/19/2023 Cancer Staging   Staging form: Breast, AJCC 8th Edition - Pathologic stage from 11/19/2023: Stage IIA (pT2, pN0(sn), cM0, G3, ER-, PR-, HER2-) - Signed by Rogers Hai, MD on 11/19/2023 Histopathologic type: Infiltrating duct carcinoma, NOS Stage prefix: Initial diagnosis Method of lymph node assessment: Sentinel lymph node biopsy Nuclear grade: G3 Multigene prognostic tests performed: None Histologic grading system: 3 grade system Residual tumor (R): R0   12/04/2023 -  Chemotherapy   Patient is on Treatment Plan : BREAST TC q21d         Ray is a 78 y.o. female presenting to clinic today for evaluation of triple negative right breast at the request of Antonetta Rollene BRAVO, MD.  Patient underwent bilateral breast MM on 07/07/23 that found: In the right breast, a mass warrants further evaluation. In the left breast, no findings suspicious for malignancy. Right breast diagnostic MM and US  was done on 08/26/23 that showed: 1.8 cm suspicious mass within the lower inner RIGHT breast. Two slightly abnormal appearing RIGHT  axillary lymph nodes.  Capitola then had a biopsy of the 4 o'clock mass in the right breast on 09/09/23 that revealed: Invasive poorly differentiated ductal adenocarcinoma with extensive necrosis, grade 3. The mass was ER-/PR-/Her2- (0+).  Today, she states that she is doing well overall. Her appetite level is at 100%. Her energy level is at 50%. She is accompanied by her brother and her son and daughter on the phone.   Abbigael had menarche around age 75 and menopause in her early 50's. She was given some sort of medication for a year to help with hot flashes. She is unaware if she has taken hormone replacements. Kayana took birth control for 2-3 years. She has 2 children with her first childbirth at age 34.   Camari denies any unintentional weight loss, new onset pains, or peripheral neuropathy. She has no prior history of breast biopsy. She has heart flutters, but denies any MI's, TIA's, or CVA's. Back pain is chronic.   Tykeisha reports intermittent ankle swellings, she attributes to 2 total knee arthroplasties in 2005.   INTERVAL HISTORY:   TEARSA KOWALEWSKI is a 78 y.o. female presenting to the clinic today for follow-up of triple negative right breast cancer. She was last seen by me on 01/15/2024.  Since her last visit, she was admitted to the hospital from 01/26/2024 to 02/03/2024 for acute diverticulitis. Lakie was treated with Zosyn  and Cipro /Flagyl .  Today, she states that she is doing well overall. Her appetite level is at 50%. Her energy level is at 25%. She is accompanied on the phone with her son. Verble did not tolerate her last treatment of chemotherapy well. She is still taking antibiotics and will continue for a week after her discharge date. She notes residual weakness. Her son states Laronica will receive at home therapy and a nursing aide that her insurance will cover for 3 weeks, however he is not sure her strength will be fully improved within that time and would like a prescription for longer  home health therapy.  She is taking Megace  BID and has an improvement in appetite. She had 2 BM's yesterday and denies any watery stools. Lamonica will be out of town next week and cannot undergo treatment at that time.   PAST MEDICAL HISTORY:   Past Medical History: 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   Depression    Diverticulosis of colon    GERD (gastroesophageal reflux disease)    Hyperlipidemia    Hypertension    Obesity    Osteoarthritis of knees, bilateral    Sleep apnea     Surgical History: Past Surgical History:  Procedure Laterality Date   ABDOMINAL HYSTERECTOMY     AXILLARY SENTINEL NODE BIOPSY Right 10/22/2023   Procedure: BIOPSY, LYMPH NODE, SENTINEL, AXILLARY;  Surgeon: Evonnie Dorothyann LABOR, DO;  Location: [facility];  Service: General;  Laterality: Right;   BREAST BIOPSY Right 09/09/2023   US  RT BREAST BX W LOC DEV 1ST LESION IMG BX SPEC US  GUIDE 09/09/2023 Lennon Nest, MD [facility]-ULTRASOUND   BREAST LUMPECTOMY WITH RADIO FREQUENCY LOCALIZER Right 10/22/2023   Procedure: BREAST LUMPECTOMY WITH RADIO FREQUENCY LOCALIZER;  Surgeon: Evonnie Dorothyann LABOR, DO;  Location: [facility];  Service: General;  Laterality: Right;   COLONOSCOPY  07/23/2003   Dr. Keven Smith:numerous large scattered diverticula   COLONOSCOPY N/A 11/15/2013   Dr. Harvey: moderate diverticula, small internal hemorrhoids, redudant colon. Next TCS in 2025 with overtube.    ESOPHAGOGASTRODUODENOSCOPY (EGD) WITH ESOPHAGEAL DILATION N/A 11/15/2013   Dr. Harvey: stricture at GE junction s/p dilation. moderate erosive gastritis, negative H.pylori   MULTIPLE TOOTH EXTRACTIONS  2024   PORTACATH PLACEMENT Left 10/22/2023   Procedure: INSERTION, TUNNELED CENTRAL VENOUS DEVICE, WITH PORT;  Surgeon: Evonnie Dorothyann LABOR, DO;  Location: [facility];  Service: General;  Laterality: Left;   TOTAL KNEE ARTHROPLASTY  06/01/2004   left / Dr. Margrette   TOTAL  KNEE ARTHROPLASTY  11/29/2003   right / Dr. Margrette   TUBAL LIGATION      Social History: Social History   Socioeconomic History   Marital status: Widowed    Spouse name: Shayleigh Bouldin   Number of children: 2   Years of education: 12   Highest education level: 12th grade  Occupational History   Occupation: retired     Associate Professor: UNEMPLOYED  Tobacco Use   Smoking status: Never    Passive exposure: Never   Smokeless tobacco: Never  Vaping Use   Vaping status: Never Used  Substance and Sexual Activity   Alcohol use: No   Drug use: No   Sexual activity: Not Currently    Partners: Male    Birth control/protection: Surgical  Other Topics Concern   Not on file  Social History Narrative   Not on file   Social Drivers of Health  Financial Resource Strain: Low Risk  (08/12/2023)   Overall Financial Resource Strain (CARDIA)    Difficulty of Paying Living Expenses: Not hard at all  Food Insecurity: No Food Insecurity (01/26/2024)   Hunger Vital Sign    Worried About Running Out of Food in the Last Year: Never true    Ran Out of Food in the Last Year: Never true  Transportation Needs: No Transportation Needs (01/26/2024)   PRAPARE - Administrator, Civil Service (Medical): No    Lack of Transportation (Non-Medical): No  Physical Activity: Sufficiently Active (08/12/2023)   Exercise Vital Sign    Days of Exercise per Week: 5 days    Minutes of Exercise per Session: 50 min  Stress: No Stress Concern Present (08/12/2023)   [employer] of Occupational Health - Occupational Stress Questionnaire    Feeling of Stress : Only a little  Social Connections: Unknown (01/26/2024)   Social Connection and Isolation Panel    Frequency of Communication with Friends and Family: Patient declined    Frequency of Social Gatherings with Friends and Family: Patient declined    Attends Religious Services: Patient declined    Database administrator or Organizations: Patient declined     Attends Banker Meetings: Patient declined    Marital Status: Widowed  Intimate Partner Violence: Not At Risk (01/26/2024)   Humiliation, Afraid, Rape, and Kick questionnaire    Fear of Current or Ex-Partner: No    Emotionally Abused: No    Physically Abused: No    Sexually Abused: No    Family History: Family History  Problem Relation Age of Onset   Diabetes Mother    Hypertension Mother    Heart disease Mother    Aneurysm Father    Diabetes Father    Hypertension Brother    Diabetes Brother    Diabetes Brother    Hypertension Brother    Breast cancer Maternal Aunt        dx >50   Cancer Maternal Aunt        unknown type; ? breast; dx >50?   Prostate cancer Maternal Uncle    Hypertension Son     Current Medications:  Current Outpatient Medications:    albuterol  (VENTOLIN  HFA) 108 (90 Base) MCG/ACT inhaler, Inhale 2 puffs into the lungs every 6 (six) hours as needed for wheezing or shortness of breath., Disp: 8.5 each, Rfl: 1   aspirin EC 81 MG tablet, Take 81 mg by mouth daily. Swallow whole., Disp: , Rfl:    atorvastatin  (LIPITOR) 20 MG tablet, Take 1 tablet (20 mg total) by mouth daily., Disp: 90 tablet, Rfl: 3   BIOTIN PO, Take 1 tablet by mouth daily., Disp: , Rfl:    bisacodyl  5 MG EC tablet, Take one tablet by mouth every 3 days as needed, for constipation. Repeat once after 6 hours if still no bowel movement, Disp: 30 tablet, Rfl: 0   Cholecalciferol (VITAMIN D3) 125 MCG (5000 UT) CAPS, Take 1 capsule (5,000 Units total) by mouth daily., Disp: 30 capsule, Rfl: 11   ciprofloxacin  (CIPRO ) 500 MG tablet, Take 1 tablet (500 mg total) by mouth 2 (two) times daily for 7 days., Disp: 14 tablet, Rfl: 0   clotrimazole -betamethasone  (LOTRISONE ) cream, Apply cream twice daily to rash in groin for 10 days, then as needed, Disp: 45 g, Rfl: 1   cycloPHOSphamide  (CYTOXAN  IJ), Inject as directed., Disp: , Rfl:    DOCEtaxel  (TAXOTERE  IV), Inject into the vein.,  Disp: ,  Rfl:    docusate sodium  (COLACE) 100 MG capsule, Take 1 capsule (100 mg total) by mouth 2 (two) times daily., Disp: 60 capsule, Rfl: 2   famotidine (PEPCID) 10 MG tablet, Take 10 mg by mouth 2 (two) times daily., Disp: , Rfl:    gabapentin  (NEURONTIN ) 100 MG capsule, Take 1 capsule (100 mg total) by mouth at bedtime. (Patient not taking: Reported on 01/25/2024), Disp: 30 capsule, Rfl: 3   Hydrocortisone (GERHARDT'S BUTT CREAM) CREA, Apply 1 application topically 3 (three) times daily., Disp: 1 each, Rfl: 1   lidocaine -prilocaine  (EMLA ) cream, Apply to affected area once, Disp: 30 g, Rfl: 3   losartan  (COZAAR ) 100 MG tablet, Take 1 tablet (100 mg total) by mouth daily., Disp: 30 tablet, Rfl: 0   magnesium  30 MG tablet, Take one tablet by mouth once daily, Disp: 30 tablet, Rfl: 2   megestrol  (MEGACE ) 400 MG/10ML suspension, Take 10 mLs (400 mg total) by mouth 2 (two) times daily., Disp: 480 mL, Rfl: 0   Melatonin 3 MG CAPS, Take 1 capsule (3 mg total) by mouth at bedtime., Disp: 30 capsule, Rfl: 3   metoprolol  succinate (TOPROL -XL) 50 MG 24 hr tablet, TAKE 1 TABLET BY MOUTH  DAILY WITH OR IMMEDIATELY  FOLLOWING A MEAL, Disp: 100 tablet, Rfl: 2   metroNIDAZOLE  (FLAGYL ) 500 MG tablet, Take 1 tablet (500 mg total) by mouth every 8 (eight) hours for 7 days., Disp: 21 tablet, Rfl: 0   olopatadine  (PATANOL) 0.1 % ophthalmic solution, Place 1 drop into both eyes 2 (two) times daily., Disp: 5 mL, Rfl: 6   omeprazole  (PRILOSEC) 20 MG capsule, Take 1 capsule (20 mg total) by mouth daily., Disp: 100 capsule, Rfl: 2   ondansetron  (ZOFRAN ) 4 MG tablet, Take 1 tablet (4 mg total) by mouth daily as needed for nausea or vomiting., Disp: 30 tablet, Rfl: 1   Palonosetron  HCl (ALOXI  IV), Inject into the vein., Disp: , Rfl:    polyethylene glycol powder (GLYCOLAX /MIRALAX ) 17 GM/SCOOP powder, Take 17 g by mouth 2 (two) times daily as needed., Disp: 3350 g, Rfl: 3   prochlorperazine  (COMPAZINE ) 10 MG tablet, Take 1 tablet  (10 mg total) by mouth every 6 (six) hours as needed for nausea or vomiting., Disp: 60 tablet, Rfl: 3   pyridOXINE  (VITAMIN B-6) 100 MG tablet, Take 200 mg by mouth daily., Disp: , Rfl:    sertraline  (ZOLOFT ) 50 MG tablet, Take 1 tablet (50 mg total) by mouth daily., Disp: 30 tablet, Rfl: 3   temazepam  (RESTORIL ) 15 MG capsule, Take 1 capsule (15 mg total) by mouth at bedtime as needed for sleep., Disp: 30 capsule, Rfl: 5   Allergies: No Known Allergies  REVIEW OF SYSTEMS:   Review of Systems  Constitutional:  Positive for fatigue. Negative for chills and fever.  HENT:   Negative for lump/mass, mouth sores, nosebleeds, sore throat and trouble swallowing.   Eyes:  Positive for eye problems (blurred vision).  Respiratory:  Positive for cough and shortness of breath.   Cardiovascular:  Negative for chest pain, leg swelling and palpitations.  Gastrointestinal:  Positive for diarrhea. Negative for abdominal pain, constipation, nausea and vomiting.  Genitourinary:  Negative for bladder incontinence, difficulty urinating, dysuria, frequency, hematuria and nocturia.   Musculoskeletal:  Negative for arthralgias, back pain, flank pain, myalgias and neck pain.  Skin:  Negative for itching and rash.  Neurological:  Negative for dizziness, headaches and numbness.  Hematological:  Does not bruise/bleed easily.  Psychiatric/Behavioral:  Positive for sleep disturbance. Negative for depression and suicidal ideas. The patient is not nervous/anxious.   All other systems reviewed and are negative.    VITALS:   There were no vitals taken for this visit.  Wt Readings from Last 3 Encounters:  02/05/24 176 lb 1.6 oz (79.9 kg)  01/25/24 150 lb (68 kg)  01/15/24 178 lb 12.7 oz (81.1 kg)    There is no height or weight on file to calculate BMI.  Performance status (ECOG): 1 - Symptomatic but completely ambulatory  PHYSICAL EXAM:   Physical Exam Vitals and nursing note reviewed. Exam conducted with a  chaperone present.  Constitutional:      Appearance: Normal appearance.  Cardiovascular:     Rate and Rhythm: Normal rate and regular rhythm.     Pulses: Normal pulses.     Heart sounds: Normal heart sounds.  Pulmonary:     Effort: Pulmonary effort is normal.     Breath sounds: Normal breath sounds.  Abdominal:     Palpations: Abdomen is soft. There is no hepatomegaly, splenomegaly or mass.     Tenderness: There is no abdominal tenderness.  Musculoskeletal:     Right lower leg: No edema.     Left lower leg: No edema.  Lymphadenopathy:     Cervical: No cervical adenopathy.     Right cervical: No superficial, deep or posterior cervical adenopathy.    Left cervical: No superficial, deep or posterior cervical adenopathy.     Upper Body:     Right upper body: No supraclavicular or axillary adenopathy.     Left upper body: No supraclavicular or axillary adenopathy.  Neurological:     General: No focal deficit present.     Mental Status: She is alert and oriented to person, place, and time.  Psychiatric:        Mood and Affect: Mood normal.        Behavior: Behavior normal.   Breast Exam Chaperone: Joesph Bohr, RN   LABS:   CBC    Component Value Date/Time   WBC 10.1 02/05/2024 1033   RBC 2.89 (L) 02/05/2024 1033   HGB 8.7 (L) 02/05/2024 1033   HGB 12.0 01/31/2023 1145   HCT 28.1 (L) 02/05/2024 1033   HCT 37.0 01/31/2023 1145   PLT 404 (H) 02/05/2024 1033   PLT 204 01/31/2023 1145   MCV 97.2 02/05/2024 1033   MCV 92 01/31/2023 1145   MCH 30.1 02/05/2024 1033   MCHC 31.0 02/05/2024 1033   RDW 21.5 (H) 02/05/2024 1033   RDW 12.6 01/31/2023 1145   LYMPHSABS 2.2 02/05/2024 1033   LYMPHSABS 5.4 (H) 12/12/2022 1033   MONOABS 0.9 02/05/2024 1033   EOSABS 0.1 02/05/2024 1033   EOSABS 0.2 12/12/2022 1033   BASOSABS 0.1 02/05/2024 1033   BASOSABS 0.1 12/12/2022 1033    CMP    Component Value Date/Time   NA 144 02/05/2024 1033   NA 143 06/27/2023 1155   K 3.5  02/05/2024 1033   CL 112 (H) 02/05/2024 1033   CO2 23 02/05/2024 1033   GLUCOSE 109 (H) 02/05/2024 1033   BUN 11 02/05/2024 1033   BUN 17 06/27/2023 1155   CREATININE 0.80 02/05/2024 1033   CREATININE 0.78 02/15/2020 0848   CALCIUM  9.0 02/05/2024 1033   PROT 6.7 02/05/2024 1033   PROT 7.6 06/27/2023 1155   ALBUMIN 3.0 (L) 02/05/2024 1033   ALBUMIN 4.1 06/27/2023 1155   AST 36 02/05/2024 1033   ALT  19 02/05/2024 1033   ALKPHOS 66 02/05/2024 1033   BILITOT 0.7 02/05/2024 1033   BILITOT 0.6 06/27/2023 1155   GFRNONAA >60 02/05/2024 1033   GFRNONAA 76 02/15/2020 0848   GFRAA 89 08/15/2020 0931   GFRAA 88 02/15/2020 0848     No results found for: CEA1, CEA / No results found for: CEA1, CEA No results found for: PSA1 No results found for: CAN199 No results found for: CAN125  No results found for: TOTALPROTELP, ALBUMINELP, A1GS, A2GS, BETS, BETA2SER, GAMS, MSPIKE, SPEI Lab Results  Component Value Date   TIBC 185 (L) 01/29/2024   TIBC 287 12/25/2023   FERRITIN 582 (H) 01/29/2024   FERRITIN 166 12/25/2023   IRONPCTSAT 43 (H) 01/29/2024   IRONPCTSAT 27 12/25/2023   No results found for: LDH   STUDIES:   DG Chest Port 1 View Result Date: 01/31/2024 CLINICAL DATA:  [id number] Dyspnea [id number] EXAM: PORTABLE CHEST 1 VIEW COMPARISON:  October 22, 2023 FINDINGS: The cardiomediastinal silhouette is unchanged in contour.Chest port with tip terminating over the confluence of the LEFT brachiocephalic vein and SVC. Atherosclerotic calcifications. No pleural effusion. No pneumothorax. Similar but favored mildly improved perihilar vascular congestion compared to most recent prior. Mild background of interstitial prominence, improved since prior. Gaseous distension of a loop of bowel in the LEFT abdomen. Unchanged elevation of the RIGHT hemidiaphragm. IMPRESSION: Persistent but favored mildly improved pulmonary edema. Electronically Signed   By: Corean Salter M.D.    On: 01/31/2024 12:06   CT ABDOMEN PELVIS W CONTRAST Result Date: 01/30/2024 CLINICAL DATA:  Elevated white count.  Possible diverticulitis. EXAM: CT ABDOMEN AND PELVIS WITH CONTRAST TECHNIQUE: Multidetector CT imaging of the abdomen and pelvis was performed using the standard protocol following bolus administration of intravenous contrast. RADIATION DOSE REDUCTION: This exam was performed according to the departmental dose-optimization program which includes automated exposure control, adjustment of the mA and/or kV according to patient size and/or use of iterative reconstruction technique. CONTRAST:  OMNIPAQUE  IOHEXOL  300 MG/ML SOLN, 30mL OMNIPAQUE  IOHEXOL  300 MG/ML SOLN COMPARISON:  January 25, 2024. FINDINGS: Lower chest: No acute abnormality. Hepatobiliary: No focal liver abnormality is seen. No gallstones, gallbladder wall thickening, or biliary dilatation. Pancreas: Unremarkable. No pancreatic ductal dilatation or surrounding inflammatory changes. Spleen: Normal in size without focal abnormality. Adrenals/Urinary Tract: Adrenal glands are unremarkable. Kidneys are normal, without renal calculi, focal lesion, or hydronephrosis. Bladder is unremarkable. Stomach/Bowel: Small sliding-type hiatal hernia. Appendix is unremarkable. No evidence of bowel obstruction. Sigmoid diverticulosis is noted with moderate wall thickening and mild inflammatory changes suggesting possible sigmoid diverticulitis. No definite abscess formation is noted. Vascular/Lymphatic: Aortic atherosclerosis. No enlarged abdominal or pelvic lymph nodes. Reproductive: Status post hysterectomy. No adnexal masses. Other: No ascites or hernia is noted. Musculoskeletal: No acute or significant osseous findings. IMPRESSION: Probable mild sigmoid diverticulitis. No definite abscess formation is noted. Small sliding-type hiatal hernia. Aortic Atherosclerosis (ICD10-I70.0). Electronically Signed   By: Lynwood Landy Raddle M.D.   On: 01/30/2024 15:25    US  Venous Img Lower Bilateral (DVT) Result Date: 01/28/2024 CLINICAL DATA:  Tachycardia.  BILATERAL lower extremity edema. EXAM: Bilateral Lower Extremity Venous Doppler Ultrasound TECHNIQUE: Gray-scale sonography with compression, as well as color and duplex ultrasound, were performed to evaluate the deep venous system(s) from the level of the common femoral vein through the popliteal and proximal calf veins. COMPARISON:  None available FINDINGS: VENOUS Normal compressibility of the common femoral, superficial femoral, and popliteal veins, as well as the visualized calf veins. Visualized  portions of profunda femoral vein and great saphenous vein unremarkable. No filling defects to suggest DVT on grayscale or color Doppler imaging. Doppler waveforms show normal direction of venous flow, normal respiratory plasticity and response to augmentation. OTHER None. Limitations: Limited visualization of the RIGHT peroneal vein. IMPRESSION: No lower extremity DVT. Electronically Signed   By: Aliene Lloyd M.D.   On: 01/28/2024 16:23   CT ABDOMEN PELVIS W CONTRAST Result Date: 01/25/2024 CLINICAL DATA:  Abdominal pain, breast cancer on chemotherapy * Tracking Code: BO * EXAM: CT ABDOMEN AND PELVIS WITH CONTRAST TECHNIQUE: Multidetector CT imaging of the abdomen and pelvis was performed using the standard protocol following bolus administration of intravenous contrast. RADIATION DOSE REDUCTION: This exam was performed according to the departmental dose-optimization program which includes automated exposure control, adjustment of the mA and/or kV according to patient size and/or use of iterative reconstruction technique. CONTRAST:  OMNIPAQUE  IOHEXOL  300 MG/ML  SOLN COMPARISON:  PET-CT, 09/25/2022 FINDINGS: Lower chest: No acute abnormality. Mild bibasilar pulmonary fibrosis. Small hiatal hernia. Hepatobiliary: No solid liver abnormality is seen. No gallstones, gallbladder wall thickening, or biliary dilatation.  Pancreas: Unremarkable. No pancreatic ductal dilatation or surrounding inflammatory changes. Spleen: Normal in size without significant abnormality. Adrenals/Urinary Tract: Adrenal glands are unremarkable. Kidneys are normal, without renal calculi, solid lesion, or hydronephrosis. Bladder is unremarkable. Stomach/Bowel: Stomach is within normal limits. Appendix appears normal. Sigmoid diverticulosis with wall thickening, mucosal hyperenhancement, and adjacent fat stranding Vascular/Lymphatic: Aortic atherosclerosis. No enlarged abdominal or pelvic lymph nodes. Reproductive: No mass or other significant abnormality. Other: Small broad-base fat containing midline ventral hernia. No ascites. Musculoskeletal: No acute or significant osseous findings. IMPRESSION: 1. Sigmoid diverticulosis with wall thickening, mucosal hyperenhancement, and adjacent fat stranding, consistent with acute uncomplicated diverticulitis. 2. No evidence of lymphadenopathy or metastatic disease in the abdomen or pelvis. 3. Mild bibasilar pulmonary fibrosis. Aortic Atherosclerosis (ICD10-I70.0). Electronically Signed   By: Marolyn JONETTA Jaksch M.D.   On: 01/25/2024 16:48

## 2024-02-05 NOTE — Progress Notes (Signed)
 Port removed during last admission.  Labs drawn peripherally.

## 2024-02-06 DIAGNOSIS — K5792 Diverticulitis of intestine, part unspecified, without perforation or abscess without bleeding: Secondary | ICD-10-CM | POA: Diagnosis not present

## 2024-02-06 DIAGNOSIS — G4700 Insomnia, unspecified: Secondary | ICD-10-CM | POA: Diagnosis not present

## 2024-02-06 DIAGNOSIS — D72829 Elevated white blood cell count, unspecified: Secondary | ICD-10-CM | POA: Diagnosis not present

## 2024-02-06 DIAGNOSIS — D630 Anemia in neoplastic disease: Secondary | ICD-10-CM | POA: Diagnosis not present

## 2024-02-06 DIAGNOSIS — E785 Hyperlipidemia, unspecified: Secondary | ICD-10-CM | POA: Diagnosis not present

## 2024-02-06 DIAGNOSIS — N179 Acute kidney failure, unspecified: Secondary | ICD-10-CM | POA: Diagnosis not present

## 2024-02-06 DIAGNOSIS — I10 Essential (primary) hypertension: Secondary | ICD-10-CM | POA: Diagnosis not present

## 2024-02-06 DIAGNOSIS — K219 Gastro-esophageal reflux disease without esophagitis: Secondary | ICD-10-CM | POA: Diagnosis not present

## 2024-02-06 DIAGNOSIS — C50912 Malignant neoplasm of unspecified site of left female breast: Secondary | ICD-10-CM | POA: Diagnosis not present

## 2024-02-06 NOTE — Transitions of Care (Post Inpatient/ED Visit) (Signed)
   02/06/2024  Name: [name] MRN: [id number] DOB: 09-02-1945  Today's TOC FU Call Status: Today's TOC FU Call Status:: Unsuccessful Call (3rd Attempt) Unsuccessful Call (1st Attempt) Date: 02/04/24 Unsuccessful Call (2nd Attempt) Date: 02/05/24 Unsuccessful Call (3rd Attempt) Date: 02/06/24  Attempted to reach the patient regarding the most recent Inpatient/ED visit.  Follow Up Plan: No further outreach attempts will be made at this time. We have been unable to contact the patient.  Signature Julian Lemmings, LPN [facility] Nurse Health Advisor Direct Dial [phone]

## 2024-02-09 ENCOUNTER — Inpatient Hospital Stay: Admitting: Nurse Practitioner

## 2024-02-10 DIAGNOSIS — K5792 Diverticulitis of intestine, part unspecified, without perforation or abscess without bleeding: Secondary | ICD-10-CM | POA: Diagnosis not present

## 2024-02-10 DIAGNOSIS — I10 Essential (primary) hypertension: Secondary | ICD-10-CM | POA: Diagnosis not present

## 2024-02-10 DIAGNOSIS — E785 Hyperlipidemia, unspecified: Secondary | ICD-10-CM | POA: Diagnosis not present

## 2024-02-10 DIAGNOSIS — D630 Anemia in neoplastic disease: Secondary | ICD-10-CM | POA: Diagnosis not present

## 2024-02-10 DIAGNOSIS — N179 Acute kidney failure, unspecified: Secondary | ICD-10-CM | POA: Diagnosis not present

## 2024-02-10 DIAGNOSIS — K219 Gastro-esophageal reflux disease without esophagitis: Secondary | ICD-10-CM | POA: Diagnosis not present

## 2024-02-10 DIAGNOSIS — G4700 Insomnia, unspecified: Secondary | ICD-10-CM | POA: Diagnosis not present

## 2024-02-10 DIAGNOSIS — C50912 Malignant neoplasm of unspecified site of left female breast: Secondary | ICD-10-CM | POA: Diagnosis not present

## 2024-02-10 DIAGNOSIS — D72829 Elevated white blood cell count, unspecified: Secondary | ICD-10-CM | POA: Diagnosis not present

## 2024-02-11 ENCOUNTER — Other Ambulatory Visit: Payer: Self-pay | Admitting: Hematology

## 2024-02-11 DIAGNOSIS — K5792 Diverticulitis of intestine, part unspecified, without perforation or abscess without bleeding: Secondary | ICD-10-CM | POA: Diagnosis not present

## 2024-02-11 DIAGNOSIS — E785 Hyperlipidemia, unspecified: Secondary | ICD-10-CM | POA: Diagnosis not present

## 2024-02-11 DIAGNOSIS — K219 Gastro-esophageal reflux disease without esophagitis: Secondary | ICD-10-CM | POA: Diagnosis not present

## 2024-02-11 DIAGNOSIS — D630 Anemia in neoplastic disease: Secondary | ICD-10-CM | POA: Diagnosis not present

## 2024-02-11 DIAGNOSIS — C50912 Malignant neoplasm of unspecified site of left female breast: Secondary | ICD-10-CM | POA: Diagnosis not present

## 2024-02-11 DIAGNOSIS — N179 Acute kidney failure, unspecified: Secondary | ICD-10-CM | POA: Diagnosis not present

## 2024-02-11 DIAGNOSIS — I10 Essential (primary) hypertension: Secondary | ICD-10-CM | POA: Diagnosis not present

## 2024-02-11 DIAGNOSIS — D72829 Elevated white blood cell count, unspecified: Secondary | ICD-10-CM | POA: Diagnosis not present

## 2024-02-11 DIAGNOSIS — G4700 Insomnia, unspecified: Secondary | ICD-10-CM | POA: Diagnosis not present

## 2024-02-13 ENCOUNTER — Inpatient Hospital Stay: Admitting: Nurse Practitioner

## 2024-02-13 DIAGNOSIS — K219 Gastro-esophageal reflux disease without esophagitis: Secondary | ICD-10-CM | POA: Diagnosis not present

## 2024-02-13 DIAGNOSIS — G4700 Insomnia, unspecified: Secondary | ICD-10-CM | POA: Diagnosis not present

## 2024-02-13 DIAGNOSIS — K5792 Diverticulitis of intestine, part unspecified, without perforation or abscess without bleeding: Secondary | ICD-10-CM | POA: Diagnosis not present

## 2024-02-13 DIAGNOSIS — C50912 Malignant neoplasm of unspecified site of left female breast: Secondary | ICD-10-CM | POA: Diagnosis not present

## 2024-02-13 DIAGNOSIS — I10 Essential (primary) hypertension: Secondary | ICD-10-CM | POA: Diagnosis not present

## 2024-02-13 DIAGNOSIS — N179 Acute kidney failure, unspecified: Secondary | ICD-10-CM | POA: Diagnosis not present

## 2024-02-13 DIAGNOSIS — D630 Anemia in neoplastic disease: Secondary | ICD-10-CM | POA: Diagnosis not present

## 2024-02-13 DIAGNOSIS — D72829 Elevated white blood cell count, unspecified: Secondary | ICD-10-CM | POA: Diagnosis not present

## 2024-02-13 DIAGNOSIS — E785 Hyperlipidemia, unspecified: Secondary | ICD-10-CM | POA: Diagnosis not present

## 2024-02-17 ENCOUNTER — Other Ambulatory Visit: Payer: Self-pay

## 2024-02-17 ENCOUNTER — Encounter: Payer: Self-pay | Admitting: Family Medicine

## 2024-02-17 ENCOUNTER — Inpatient Hospital Stay

## 2024-02-17 ENCOUNTER — Telehealth: Admitting: Family Medicine

## 2024-02-17 ENCOUNTER — Inpatient Hospital Stay (HOSPITAL_BASED_OUTPATIENT_CLINIC_OR_DEPARTMENT_OTHER): Admitting: Hematology

## 2024-02-17 VITALS — Ht 60.0 in | Wt 176.0 lb

## 2024-02-17 VITALS — BP 178/82 | HR 83 | Temp 98.0°F | Resp 19

## 2024-02-17 DIAGNOSIS — K5792 Diverticulitis of intestine, part unspecified, without perforation or abscess without bleeding: Secondary | ICD-10-CM | POA: Diagnosis not present

## 2024-02-17 DIAGNOSIS — Z5189 Encounter for other specified aftercare: Secondary | ICD-10-CM | POA: Diagnosis not present

## 2024-02-17 DIAGNOSIS — Z17421 Hormone receptor negative with human epidermal growth factor receptor 2 negative status: Secondary | ICD-10-CM | POA: Diagnosis not present

## 2024-02-17 DIAGNOSIS — Z741 Need for assistance with personal care: Secondary | ICD-10-CM

## 2024-02-17 DIAGNOSIS — Z7689 Persons encountering health services in other specified circumstances: Secondary | ICD-10-CM

## 2024-02-17 DIAGNOSIS — I10 Essential (primary) hypertension: Secondary | ICD-10-CM | POA: Diagnosis not present

## 2024-02-17 DIAGNOSIS — C50911 Malignant neoplasm of unspecified site of right female breast: Secondary | ICD-10-CM

## 2024-02-17 DIAGNOSIS — C50311 Malignant neoplasm of lower-inner quadrant of right female breast: Secondary | ICD-10-CM | POA: Diagnosis not present

## 2024-02-17 DIAGNOSIS — Z171 Estrogen receptor negative status [ER-]: Secondary | ICD-10-CM

## 2024-02-17 DIAGNOSIS — Z5111 Encounter for antineoplastic chemotherapy: Secondary | ICD-10-CM | POA: Diagnosis not present

## 2024-02-17 LAB — CBC WITH DIFFERENTIAL/PLATELET
Abs Immature Granulocytes: 0.02 K/uL (ref 0.00–0.07)
Basophils Absolute: 0.1 K/uL (ref 0.0–0.1)
Basophils Relative: 1 %
Eosinophils Absolute: 0.6 K/uL — ABNORMAL HIGH (ref 0.0–0.5)
Eosinophils Relative: 6 %
HCT: 31.3 % — ABNORMAL LOW (ref 36.0–46.0)
Hemoglobin: 9.9 g/dL — ABNORMAL LOW (ref 12.0–15.0)
Immature Granulocytes: 0 %
Lymphocytes Relative: 24 %
Lymphs Abs: 2.2 K/uL (ref 0.7–4.0)
MCH: 31.9 pg (ref 26.0–34.0)
MCHC: 31.6 g/dL (ref 30.0–36.0)
MCV: 101 fL — ABNORMAL HIGH (ref 80.0–100.0)
Monocytes Absolute: 0.7 K/uL (ref 0.1–1.0)
Monocytes Relative: 8 %
Neutro Abs: 5.6 K/uL (ref 1.7–7.7)
Neutrophils Relative %: 61 %
Platelets: 210 K/uL (ref 150–400)
RBC: 3.1 MIL/uL — ABNORMAL LOW (ref 3.87–5.11)
RDW: 19.9 % — ABNORMAL HIGH (ref 11.5–15.5)
WBC: 9 K/uL (ref 4.0–10.5)
nRBC: 0 % (ref 0.0–0.2)

## 2024-02-17 LAB — COMPREHENSIVE METABOLIC PANEL WITH GFR
ALT: 10 U/L (ref 0–44)
AST: 20 U/L (ref 15–41)
Albumin: 3 g/dL — ABNORMAL LOW (ref 3.5–5.0)
Alkaline Phosphatase: 51 U/L (ref 38–126)
Anion gap: 10 (ref 5–15)
BUN: 8 mg/dL (ref 8–23)
CO2: 22 mmol/L (ref 22–32)
Calcium: 8.7 mg/dL — ABNORMAL LOW (ref 8.9–10.3)
Chloride: 108 mmol/L (ref 98–111)
Creatinine, Ser: 0.62 mg/dL (ref 0.44–1.00)
GFR, Estimated: >60 mL/min (ref >60)
Glucose, Bld: 90 mg/dL (ref 70–99)
Potassium: 3.2 mmol/L — ABNORMAL LOW (ref 3.5–5.1)
Sodium: 140 mmol/L (ref 135–145)
Total Bilirubin: 1.1 mg/dL (ref 0.0–1.2)
Total Protein: 6.3 g/dL — ABNORMAL LOW (ref 6.5–8.1)

## 2024-02-17 LAB — MAGNESIUM: Magnesium: 1.7 mg/dL (ref 1.7–2.4)

## 2024-02-17 MED ORDER — DEXAMETHASONE SODIUM PHOSPHATE 10 MG/ML IJ SOLN
10.0000 mg | Freq: Once | INTRAMUSCULAR | Status: AC
  Administered 2024-02-17: 10 mg via INTRAVENOUS
  Filled 2024-02-17: qty 1

## 2024-02-17 MED ORDER — POTASSIUM CHLORIDE CRYS ER 20 MEQ PO TBCR
40.0000 meq | EXTENDED_RELEASE_TABLET | Freq: Once | ORAL | Status: AC
  Administered 2024-02-17: 40 meq via ORAL
  Filled 2024-02-17: qty 2

## 2024-02-17 MED ORDER — SODIUM CHLORIDE 0.9 % IV SOLN
880.0000 mg | Freq: Once | INTRAVENOUS | Status: AC
  Administered 2024-02-17: 880 mg via INTRAVENOUS
  Filled 2024-02-17: qty 44

## 2024-02-17 MED ORDER — HEPARIN SOD (PORK) LOCK FLUSH 100 UNIT/ML IV SOLN
500.0000 [IU] | Freq: Once | INTRAVENOUS | Status: AC | PRN
  Administered 2024-02-17: 500 [IU]

## 2024-02-17 MED ORDER — PEGFILGRASTIM-CBQV (INF DEV) 6 MG/0.6ML ~~LOC~~ SOSY
6.0000 mg | PREFILLED_SYRINGE | Freq: Once | SUBCUTANEOUS | Status: AC
  Administered 2024-02-17: 6 mg via SUBCUTANEOUS
  Filled 2024-02-17: qty 0.6

## 2024-02-17 MED ORDER — PALONOSETRON HCL INJECTION 0.25 MG/5ML
0.2500 mg | Freq: Once | INTRAVENOUS | Status: AC
  Administered 2024-02-17: 0.25 mg via INTRAVENOUS
  Filled 2024-02-17: qty 5

## 2024-02-17 MED ORDER — SODIUM CHLORIDE 0.9 % IV SOLN
INTRAVENOUS | Status: DC
Start: 2024-02-17 — End: 2024-02-17

## 2024-02-17 MED ORDER — SODIUM CHLORIDE 0.9% FLUSH
10.0000 mL | INTRAVENOUS | Status: DC | PRN
  Administered 2024-02-17: 10 mL

## 2024-02-17 MED ORDER — SODIUM CHLORIDE 0.9 % IV SOLN
60.0000 mg/m2 | Freq: Once | INTRAVENOUS | Status: AC
  Administered 2024-02-17: 115 mg via INTRAVENOUS
  Filled 2024-02-17: qty 11.5

## 2024-02-17 NOTE — Progress Notes (Signed)
 Virtual Visit via Video Note  I connected with [name] on 02/17/24 at  8:20 AM EDT by a video enabled telemedicine application and verified that I am speaking with the correct person using two identifiers.  Location: Patient: CAR Provider: OFFICE   I discussed the limitations of evaluation and management by telemedicine and the availability of in person appointments. The patient expressed understanding and agreed to proceed.  History of Present Illness: Mr. Here for a follow-up transitional care visit following hospitalization from July 6 to July 15 with a primary diagnosis of acute diverticulitis.  She states that she feels much better overall.  She denies any abdominal pain nausea vomiting vomiting loose stool bloody stool or problems with bowel movements.  She reports being weak and unable to dress and bathe herself without help as well as prepare food.  She is currently in a 21-day window following her hospitalization where she is getting home health assistance however both the patient and her son who she is currently living with are very concerned as to what will happen once this 21-day.  Is over.  She continues to be treated for breast cancer with chemotherapy and at her advanced age of 59 with the comorbidity she has she really is not capable of independently performing activities of daily living certainly during that time she is receiving treatment.   Observations/Objective: ,Ht 5' (1.524 m)   Wt 176 lb (79.8 kg)   BMI 34.37 kg/m  Fairly Good communication with no confusion aNo signs of respiratory distress during speech     Assessment and Plan: Acute diverticulitis Resolved, apetite is fair, npo lower abdominal pain, bleeding or abnormality with BM Rept CBC and diff and bmp and egfr today  Essential hypertension Will f/u blood pressure when she is at cancer center today, meds are reviewed  Invasive ductal carcinoma of breast, female, right (HCC) Currently en route  to treatment , has a lot of fatigue , needs ongoing assistance with ADLs  Requires daily assistance for activities of daily living (ADL) and comfort needs Elderly pt with chronic respiratory failure, hypertension, osteoarthritis currently undergoing chemo for breast cancer Incapable of ADL's without assistance, refer for  case management  to see how ongoing need can be met once current h/h situation ends ( 21 days post discharge)  Encounter for support and coordination of transition of care Patient in for follow up of recent hospitalization. And TOC visit Discharge summary, and laboratory and radiology data are reviewed, and any questions or concerns  are discussed. Specific issues requiring follow up are specifically addressed.    Follow Up Instructions:    I discussed the assessment and treatment plan with the patient. The patient was provided an opportunity to ask questions and all were answered. The patient agreed with the plan and demonstrated an understanding of the instructions.   The patient was advised to call back or seek an in-person evaluation if the symptoms worsen or if the condition fails to improve as anticipated.  I provided 25 minutes of non-face-to-face time during this encounter.   Rollene Pesa, MD

## 2024-02-17 NOTE — Progress Notes (Signed)
 Reviewed port tip placement with oncologist for port lab draw.

## 2024-02-17 NOTE — Progress Notes (Addendum)
 KCl 40mEq PO x 1 entered per protocol (K=3.2). Confirmed dose increase today, Taxotere =60mg /m2 and CTX= 480mg /m2. CTX dose decreased to 880mg  (480mg /m2) based on today's BSA=1.84 per Dr. Rogers.  Errica Dutil, PharmD, MBA

## 2024-02-17 NOTE — Patient Instructions (Signed)

## 2024-02-17 NOTE — Progress Notes (Signed)
 Patient presents today for chemotherapy infusion. Patient is in satisfactory condition with no new complaints voiced.  Vital signs are stable.  Labs reviewed by Dr. Rogers during the office visit and all labs are within treatment parameters.  Potassium today is 3.2.  We will give Klor Con 40 mEq PO x one dose today per standing orders by Dr. Rogers.  We will proceed with treatment per MD orders.   Patient tolerated treatment well with no complaints voiced. BP elevated at completion of treatment.  Patient denied any symptoms and stated that she had not had BP medications today.  Patient stated that she was going home to take her medications.   Udenyca  OnPro injection applied to R arm.  Patient educated on OnPro.  Patient left via wheelchair with family member in stable condition.  Vital signs stable at discharge.  Follow up as scheduled.

## 2024-02-17 NOTE — Progress Notes (Signed)
 [facility] [address] [place] [address]   Clinic Day:  02/17/2024  Referring physician: Antonetta Rollene BRAVO, MD  Patient Care Team: Mallory Rollene BRAVO, MD as PCP - General Mallory Jayson MATSU, MD as PCP - Cardiology (Cardiology) Mallory Margo CROME, MD (Inactive) as Consulting Physician (Gastroenterology) Mallory Hai, MD as Medical Oncologist (Medical Oncology) Mallory Joesph SQUIBB, RN as Oncology Nurse Navigator (Medical Oncology) [name], Mallory LABOR, DO as Consulting Physician (General Surgery)   ASSESSMENT & PLAN:   Assessment:  1.  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  0.5 cm right axillary lymph node which appears to have lost its fatty hilum and another right axillary lymph node with upper limits of normal cortical thickness 3.7 mm. - Right breast mass biopsy (09/09/2023): Invasive poorly differentiated ductal adenocarcinoma, grade 3, ER/PR negative, HER2 (0+), Ki 67: 30%.  Lymph node could not be biopsied as it was not visible on ultrasound. - PET scan (09/25/2023): Mild metabolic activity in the medial right breast with no evidence of metastatic disease. - Right breast lumpectomy and SLNB on 10/22/2023 - Pathology: 2.1 cm IDC, grade 3, with extensive necrosis, associated DCIS, intermediate grade, margins negative.  LVI negative.  0/7 sentinel lymph nodes involved. - Adjuvant chemotherapy with dose attenuated TC cycle 1 on 12/04/2023  2. Social/Family History: -Lives at home with her granddaughter. Retired from [employer]. No tobacco use. No ETOH consumption. No chemical exposures.  -2 Maternal aunts had breast cancer. Maternal uncle had prostate cancer. Maternal niece had breast cancer.   Plan:  1.  Stage IIa  (T2 N0 M0 G3 ER/PR/HER2-) TNBC of the right breast: - She has completed 3 cycles of dose attenuated TC on 01/15/2024 and was hospitalized from 01/25/2024 through 02/03/2024 for diverticulitis. - She completed Flagyl  and Cipro .  Overall energy levels have improved. - Labs today: Normal LFTs and creatinine.  CBC grossly normal with hemoglobin improved to 9.9. - She may proceed with cycle 4 today.  This will conclude her chemotherapy regimen. - I will refer her to radiation therapy close to where she lives in [place]. - Recommend follow-up in 4 weeks with repeat labs. - After next visit, she will follow-up in the clinic every 4 to 6 months for monitoring.  She will continue yearly mammograms.   2.  Peripheral neuropathy: - Tingling in the fingers of both hands from carpal tunnel is stable.  No tingling in the legs reported.  3.  Decreased appetite: - She will continue Megace  400 mg twice daily.  She is eating better.   No orders of the defined types were placed in this encounter.     [name],acting as a Neurosurgeon for Wagner Rogers, MD.,have documented all relevant documentation on the behalf of Wagner Rogers, MD,as directed by  Wagner Rogers, MD while in the presence of Wagner Rogers, MD.  I, Wagner Rogers MD, have reviewed the above documentation for accuracy and completeness, and I agree with the above.      Wagner Rogers, MD   7/29/20253:33 PM  CHIEF COMPLAINT/PURPOSE OF CONSULT:   Diagnosis: Right breast triple negative breast cancer.   Cancer Staging  Breast cancer of lower-inner quadrant of right female breast 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  VUS in POLE at  p.T41M (c.122C>T).  Report date is 10/09/2023.   The CancerNext-Expanded gene panel offered by [facility] and includes sequencing, rearrangement, and RNA analysis for the following 76 genes: AIP, ALK, APC, ATM, AXIN2, BAP1, BARD1, BMPR1A, BRCA1, BRCA2, BRIP1, CDC73, CDH1, CDK4, CDKN1B, CDKN2A, CEBPA, CHEK2, CTNNA1, DDX41, DICER1, ETV6, FH, FLCN, GATA2, LZTR1, MAX, MBD4, MEN1, MET, MLH1, MSH2, MSH3, MSH6, MUTYH, NF1, NF2, NTHL1, PALB2, PHOX2B, PMS2, POT1, PRKAR1A, PTCH1, PTEN, RAD51C, RAD51D, RB1, RET, RUNX1, SDHA, SDHAF2, SDHB, SDHC, SDHD, SMAD4, SMARCA4, SMARCB1, SMARCE1, STK11, SUFU, TMEM127, TP53, TSC1, TSC2, VHL, and WT1 (sequencing and deletion/duplication); EGFR, HOXB13, KIT, MITF, PDGFRA, POLD1, and POLE (sequencing only); EPCAM and GREM1 (deletion/duplication only).    11/19/2023 Cancer Staging   Staging form: Breast, AJCC 8th Edition - Pathologic stage from 11/19/2023: Stage IIA (pT2, pN0(sn), cM0, G3, ER-, PR-, HER2-) - Signed by Mallory Hai, MD on 11/19/2023 Histopathologic type: Infiltrating duct carcinoma, NOS Stage prefix: Initial diagnosis Method of lymph node assessment: Sentinel lymph node biopsy Nuclear grade: G3 Multigene prognostic tests performed: None Histologic grading system: 3 grade system Residual tumor (R): R0   12/04/2023 -  Chemotherapy   Patient is on Treatment Plan : BREAST TC q21d         [name] is a 78 y.o. female presenting to clinic today for evaluation of triple negative right breast at the request of Mallory Rollene BRAVO, MD.  Patient underwent bilateral breast MM on 07/07/23 that found: In the right breast, a mass warrants further evaluation. In the left breast, no findings  suspicious for malignancy. Right breast diagnostic MM and US  was done on 08/26/23 that showed: 1.8 cm suspicious mass within the lower inner RIGHT breast. Two slightly abnormal appearing RIGHT axillary lymph nodes.  [name] then had a biopsy of the 4 o'clock mass in the right breast on 09/09/23 that revealed: Invasive poorly differentiated ductal adenocarcinoma with extensive necrosis, grade 3. The mass was ER-/PR-/Her2- (0+).  Today, she states that she is doing well overall. Her appetite level is at 100%. Her energy level is at 50%. She is accompanied by her brother and her son and daughter on the phone.   [name] had menarche around age 52 and menopause in her early 40's. She was given some sort of medication for a year to help with hot flashes. She is unaware if she has taken hormone replacements. [name] took birth control for 2-3 years. She has 2 children with her first childbirth at age 79.   [name] denies any unintentional weight loss, new onset pains, or peripheral neuropathy. She has no prior history of breast biopsy. She has heart flutters, but denies any MI's, TIA's, or CVA's. Back pain is chronic.   [name] reports intermittent ankle swellings, she attributes to 2 total knee arthroplasties in 2005.   INTERVAL HISTORY:   [name] is a 78 y.o. female presenting to the clinic today for follow-up of triple negative right breast cancer. She was last  seen by me on 02/05/2024.  Today, she states that she is doing well overall. Her appetite level is at 70%. Her energy level is at 0%.   [name] states she tolerated her most recent treatment better than the previous one and would like to proceed with chemotherapy today. She denies any abdominal pain, diarrhea, or worsened peripheral neuropathy. Neuropathy presents as tingling in the fingers and she denies any neuropathy in the lower extremities.   Megace  is effectively improving appetite, though she has not taken it today.   [name] is currently  living with her son in [place], [place].    PAST MEDICAL HISTORY:   Past Medical History: 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   Depression    Diverticulosis of colon    GERD (gastroesophageal reflux disease)    Hyperlipidemia    Hypertension    Obesity    Osteoarthritis of knees, bilateral    Sleep apnea     Surgical History: Past Surgical History:  Procedure Laterality Date   ABDOMINAL HYSTERECTOMY     AXILLARY SENTINEL NODE BIOPSY Right 10/22/2023   Procedure: BIOPSY, LYMPH NODE, SENTINEL, AXILLARY;  Surgeon: Evonnie Mallory LABOR, DO;  Location: [facility];  Service: General;  Laterality: Right;   BREAST BIOPSY Right 09/09/2023   US  RT BREAST BX W LOC DEV 1ST LESION IMG BX SPEC US  GUIDE 09/09/2023 Lennon Nest, MD [facility]-ULTRASOUND   BREAST LUMPECTOMY WITH RADIO FREQUENCY LOCALIZER Right 10/22/2023   Procedure: BREAST LUMPECTOMY WITH RADIO FREQUENCY LOCALIZER;  Surgeon: Evonnie Mallory LABOR, DO;  Location: [facility];  Service: General;  Laterality: Right;   COLONOSCOPY  07/23/2003   Dr. Keven Smith:numerous large scattered diverticula   COLONOSCOPY N/A 11/15/2013   Dr. Harvey: moderate diverticula, small internal hemorrhoids, redudant colon. Next TCS in 2025 with overtube.    ESOPHAGOGASTRODUODENOSCOPY (EGD) WITH ESOPHAGEAL DILATION N/A 11/15/2013   Dr. Harvey: stricture at GE junction s/p dilation. moderate erosive gastritis, negative H.pylori   MULTIPLE TOOTH EXTRACTIONS  2024   PORTACATH PLACEMENT Left 10/22/2023   Procedure: INSERTION, TUNNELED CENTRAL VENOUS DEVICE, WITH PORT;  Surgeon: Evonnie Mallory LABOR, DO;  Location: [facility];  Service: General;  Laterality: Left;   TOTAL KNEE ARTHROPLASTY  06/01/2004   left / Dr. Margrette   TOTAL KNEE ARTHROPLASTY  11/29/2003   right / Dr. Margrette   TUBAL LIGATION      Social History: Social History   Socioeconomic History   Marital status: Widowed    Spouse  name: Shaelin Lalley   Number of children: 2   Years of education: 12   Highest education level: 12th grade  Occupational History   Occupation: retired     Associate Professor: UNEMPLOYED  Tobacco Use   Smoking status: Never    Passive exposure: Never   Smokeless tobacco: Never  Vaping Use   Vaping status: Never Used  Substance and Sexual Activity   Alcohol use: No   Drug use: No   Sexual activity: Not Currently    Partners: Male    Birth control/protection: Surgical  Other Topics Concern   Not on file  Social History Narrative   Not on file   Social Drivers of Health   Financial Resource Strain: Low Risk  (08/12/2023)   Overall Financial Resource Strain (CARDIA)    Difficulty of Paying Living Expenses: Not hard at all  Food Insecurity: No Food Insecurity (01/26/2024)   Hunger Vital  Sign    Worried About Programme researcher, broadcasting/film/video in the Last Year: Never true    Ran Out of Food in the Last Year: Never true  Transportation Needs: No Transportation Needs (01/26/2024)   PRAPARE - Administrator, Civil Service (Medical): No    Lack of Transportation (Non-Medical): No  Physical Activity: Sufficiently Active (08/12/2023)   Exercise Vital Sign    Days of Exercise per Week: 5 days    Minutes of Exercise per Session: 50 min  Stress: No Stress Concern Present (08/12/2023)   [employer] of Occupational Health - Occupational Stress Questionnaire    Feeling of Stress : Only a little  Social Connections: Unknown (01/26/2024)   Social Connection and Isolation Panel    Frequency of Communication with Friends and Family: Patient declined    Frequency of Social Gatherings with Friends and Family: Patient declined    Attends Religious Services: Patient declined    Database administrator or Organizations: Patient declined    Attends Banker Meetings: Patient declined    Marital Status: Widowed  Intimate Partner Violence: Not At Risk (01/26/2024)   Humiliation, Afraid, Rape, and  Kick questionnaire    Fear of Current or Ex-Partner: No    Emotionally Abused: No    Physically Abused: No    Sexually Abused: No    Family History: Family History  Problem Relation Age of Onset   Diabetes Mother    Hypertension Mother    Heart disease Mother    Aneurysm Father    Diabetes Father    Hypertension Brother    Diabetes Brother    Diabetes Brother    Diabetes Brother    Hypertension Brother    Breast cancer Maternal Aunt        dx >50   Cancer Maternal Aunt        unknown type; ? breast; dx >50?   Prostate cancer Maternal Uncle    Hypertension Son     Current Medications:  Current Outpatient Medications:    albuterol  (VENTOLIN  HFA) 108 (90 Base) MCG/ACT inhaler, Inhale 2 puffs into the lungs every 6 (six) hours as needed for wheezing or shortness of breath., Disp: 8.5 each, Rfl: 1   aspirin EC 81 MG tablet, Take 81 mg by mouth daily. Swallow whole., Disp: , Rfl:    atorvastatin  (LIPITOR) 20 MG tablet, Take 1 tablet (20 mg total) by mouth daily., Disp: 90 tablet, Rfl: 3   BIOTIN PO, Take 1 tablet by mouth daily., Disp: , Rfl:    bisacodyl  5 MG EC tablet, Take one tablet by mouth every 3 days as needed, for constipation. Repeat once after 6 hours if still no bowel movement, Disp: 30 tablet, Rfl: 0   Cholecalciferol (VITAMIN D3) 125 MCG (5000 UT) CAPS, Take 1 capsule (5,000 Units total) by mouth daily., Disp: 30 capsule, Rfl: 11   clotrimazole -betamethasone  (LOTRISONE ) cream, Apply cream twice daily to rash in groin for 10 days, then as needed, Disp: 45 g, Rfl: 1   cycloPHOSphamide  (CYTOXAN  IJ), Inject as directed., Disp: , Rfl:    DOCEtaxel  (TAXOTERE  IV), Inject into the vein., Disp: , Rfl:    docusate sodium  (COLACE) 100 MG capsule, Take 1 capsule (100 mg total) by mouth 2 (two) times daily., Disp: 60 capsule, Rfl: 2   famotidine (PEPCID) 10 MG tablet, Take 10 mg by mouth 2 (two) times daily., Disp: , Rfl:    furosemide  (LASIX ) 40 MG tablet, Take 40 mg  by mouth  daily as needed., Disp: , Rfl:    gabapentin  (NEURONTIN ) 100 MG capsule, Take 1 capsule (100 mg total) by mouth at bedtime., Disp: 30 capsule, Rfl: 3   Hydrocortisone (GERHARDT'S BUTT CREAM) CREA, Apply 1 application topically 3 (three) times daily., Disp: 1 each, Rfl: 1   lidocaine -prilocaine  (EMLA ) cream, Apply to affected area once, Disp: 30 g, Rfl: 3   losartan  (COZAAR ) 100 MG tablet, Take 1 tablet (100 mg total) by mouth daily., Disp: 30 tablet, Rfl: 0   losartan -hydrochlorothiazide  (HYZAAR) 100-25 MG tablet, Take 1 tablet by mouth daily., Disp: , Rfl:    magnesium  30 MG tablet, Take one tablet by mouth once daily, Disp: 30 tablet, Rfl: 2   megestrol  (MEGACE ) 40 MG/ML suspension, Take 10 mLs (400 mg total) by mouth 2 (two) times daily., Disp: 480 mL, Rfl: 0   Melatonin 3 MG CAPS, Take 1 capsule (3 mg total) by mouth at bedtime., Disp: 30 capsule, Rfl: 3   metoprolol  succinate (TOPROL -XL) 50 MG 24 hr tablet, TAKE 1 TABLET BY MOUTH  DAILY WITH OR IMMEDIATELY  FOLLOWING A MEAL, Disp: 100 tablet, Rfl: 2   olopatadine  (PATANOL) 0.1 % ophthalmic solution, Place 1 drop into both eyes 2 (two) times daily., Disp: 5 mL, Rfl: 6   omeprazole  (PRILOSEC) 20 MG capsule, Take 1 capsule (20 mg total) by mouth daily., Disp: 100 capsule, Rfl: 2   ondansetron  (ZOFRAN ) 4 MG tablet, Take 1 tablet (4 mg total) by mouth daily as needed for nausea or vomiting., Disp: 30 tablet, Rfl: 1   Palonosetron  HCl (ALOXI  IV), Inject into the vein., Disp: , Rfl:    polyethylene glycol powder (GLYCOLAX /MIRALAX ) 17 GM/SCOOP powder, Take 17 g by mouth 2 (two) times daily as needed., Disp: 3350 g, Rfl: 3   potassium chloride  SA (KLOR-CON  M) 20 MEQ tablet, Take 20 mEq by mouth daily., Disp: , Rfl:    prochlorperazine  (COMPAZINE ) 10 MG tablet, Take 1 tablet (10 mg total) by mouth every 6 (six) hours as needed for nausea or vomiting., Disp: 60 tablet, Rfl: 3   pyridOXINE  (VITAMIN B-6) 100 MG tablet, Take 200 mg by mouth daily., Disp: ,  Rfl:    sertraline  (ZOLOFT ) 50 MG tablet, Take 1 tablet (50 mg total) by mouth daily., Disp: 30 tablet, Rfl: 3   temazepam  (RESTORIL ) 15 MG capsule, Take 1 capsule (15 mg total) by mouth at bedtime as needed for sleep., Disp: 30 capsule, Rfl: 5 No current facility-administered medications for this visit.  Facility-Administered Medications Ordered in Other Visits:    0.9 %  sodium chloride  infusion, , Intravenous, Continuous, Mallory Hai, MD, Stopped at 02/17/24 1435   sodium chloride  flush (NS) 0.9 % injection 10 mL, 10 mL, Intracatheter, PRN, Jamelle Noy, MD, 10 mL at 02/17/24 1437   Allergies: No Known Allergies  REVIEW OF SYSTEMS:   Review of Systems  Constitutional:  Negative for chills, fatigue and fever.  HENT:   Negative for lump/mass, mouth sores, nosebleeds, sore throat and trouble swallowing.   Eyes:  Negative for eye problems.  Respiratory:  Positive for cough and shortness of breath.   Cardiovascular:  Negative for chest pain, leg swelling and palpitations.  Gastrointestinal:  Negative for abdominal pain, constipation, diarrhea, nausea and vomiting.  Genitourinary:  Negative for bladder incontinence, difficulty urinating, dysuria, frequency, hematuria and nocturia.   Musculoskeletal:  Negative for arthralgias, back pain, flank pain, myalgias and neck pain.       +right axillary pain, 5-6/10 severity  Skin:  Negative for itching and rash.  Neurological:  Negative for dizziness, headaches and numbness.  Hematological:  Does not bruise/bleed easily.  Psychiatric/Behavioral:  Positive for sleep disturbance. Negative for depression and suicidal ideas. The patient is not nervous/anxious.   All other systems reviewed and are negative.    VITALS:   Height 5' (1.524 m), weight 176 lb (79.8 kg).  Wt Readings from Last 3 Encounters:  02/17/24 176 lb (79.8 kg)  02/17/24 176 lb (79.8 kg)  02/05/24 176 lb 1.6 oz (79.9 kg)    Body mass index is 34.37  kg/m.  Performance status (ECOG): 1 - Symptomatic but completely ambulatory  PHYSICAL EXAM:   Physical Exam Vitals and nursing note reviewed. Exam conducted with a chaperone present.  Constitutional:      Appearance: Normal appearance.  Cardiovascular:     Rate and Rhythm: Normal rate and regular rhythm.     Pulses: Normal pulses.     Heart sounds: Normal heart sounds.  Pulmonary:     Effort: Pulmonary effort is normal.     Breath sounds: Normal breath sounds.  Abdominal:     Palpations: Abdomen is soft. There is no hepatomegaly, splenomegaly or mass.     Tenderness: There is no abdominal tenderness.  Musculoskeletal:     Right lower leg: No edema.     Left lower leg: No edema.  Lymphadenopathy:     Cervical: No cervical adenopathy.     Right cervical: No superficial, deep or posterior cervical adenopathy.    Left cervical: No superficial, deep or posterior cervical adenopathy.     Upper Body:     Right upper body: No supraclavicular or axillary adenopathy.     Left upper body: No supraclavicular or axillary adenopathy.  Neurological:     General: No focal deficit present.     Mental Status: She is alert and oriented to person, place, and time.  Psychiatric:        Mood and Affect: Mood normal.        Behavior: Behavior normal.   Breast Exam Chaperone: Joesph Bohr, RN   LABS:   CBC    Component Value Date/Time   WBC 9.0 02/17/2024 0938   RBC 3.10 (L) 02/17/2024 0938   HGB 9.9 (L) 02/17/2024 0938   HGB 12.0 01/31/2023 1145   HCT 31.3 (L) 02/17/2024 0938   HCT 37.0 01/31/2023 1145   PLT 210 02/17/2024 0938   PLT 204 01/31/2023 1145   MCV 101.0 (H) 02/17/2024 0938   MCV 92 01/31/2023 1145   MCH 31.9 02/17/2024 0938   MCHC 31.6 02/17/2024 0938   RDW 19.9 (H) 02/17/2024 0938   RDW 12.6 01/31/2023 1145   LYMPHSABS 2.2 02/17/2024 0938   LYMPHSABS 5.4 (H) 12/12/2022 1033   MONOABS 0.7 02/17/2024 0938   EOSABS 0.6 (H) 02/17/2024 0938   EOSABS 0.2 12/12/2022  1033   BASOSABS 0.1 02/17/2024 0938   BASOSABS 0.1 12/12/2022 1033    CMP    Component Value Date/Time   NA 140 02/17/2024 0938   NA 143 06/27/2023 1155   K 3.2 (L) 02/17/2024 0938   CL 108 02/17/2024 0938   CO2 22 02/17/2024 0938   GLUCOSE 90 02/17/2024 0938   BUN 8 02/17/2024 0938   BUN 17 06/27/2023 1155   CREATININE 0.62 02/17/2024 0938   CREATININE 0.78 02/15/2020 0848   CALCIUM  8.7 (L) 02/17/2024 0938   PROT 6.3 (L) 02/17/2024 0938   PROT 7.6 06/27/2023 1155  ALBUMIN 3.0 (L) 02/17/2024 0938   ALBUMIN 4.1 06/27/2023 1155   AST 20 02/17/2024 0938   ALT 10 02/17/2024 0938   ALKPHOS 51 02/17/2024 0938   BILITOT 1.1 02/17/2024 0938   BILITOT 0.6 06/27/2023 1155   GFRNONAA >60 02/17/2024 0938   GFRNONAA 76 02/15/2020 0848   GFRAA 89 08/15/2020 0931   GFRAA 88 02/15/2020 0848     No results found for: CEA1, CEA / No results found for: CEA1, CEA No results found for: PSA1 No results found for: CAN199 No results found for: CAN125  No results found for: TOTALPROTELP, ALBUMINELP, A1GS, A2GS, BETS, BETA2SER, GAMS, MSPIKE, SPEI Lab Results  Component Value Date   TIBC 185 (L) 01/29/2024   TIBC 287 12/25/2023   FERRITIN 582 (H) 01/29/2024   FERRITIN 166 12/25/2023   IRONPCTSAT 43 (H) 01/29/2024   IRONPCTSAT 27 12/25/2023   No results found for: LDH   STUDIES:   DG Chest Port 1 View Result Date: 01/31/2024 CLINICAL DATA:  [id number] Dyspnea [id number] EXAM: PORTABLE CHEST 1 VIEW COMPARISON:  October 22, 2023 FINDINGS: The cardiomediastinal silhouette is unchanged in contour.Chest port with tip terminating over the confluence of the LEFT brachiocephalic vein and SVC. Atherosclerotic calcifications. No pleural effusion. No pneumothorax. Similar but favored mildly improved perihilar vascular congestion compared to most recent prior. Mild background of interstitial prominence, improved since prior. Gaseous distension of a loop of bowel in the LEFT  abdomen. Unchanged elevation of the RIGHT hemidiaphragm. IMPRESSION: Persistent but favored mildly improved pulmonary edema. Electronically Signed   By: Corean Salter M.D.   On: 01/31/2024 12:06   CT ABDOMEN PELVIS W CONTRAST Result Date: 01/30/2024 CLINICAL DATA:  Elevated white count.  Possible diverticulitis. EXAM: CT ABDOMEN AND PELVIS WITH CONTRAST TECHNIQUE: Multidetector CT imaging of the abdomen and pelvis was performed using the standard protocol following bolus administration of intravenous contrast. RADIATION DOSE REDUCTION: This exam was performed according to the departmental dose-optimization program which includes automated exposure control, adjustment of the mA and/or kV according to patient size and/or use of iterative reconstruction technique. CONTRAST:  OMNIPAQUE  IOHEXOL  300 MG/ML SOLN, 30mL OMNIPAQUE  IOHEXOL  300 MG/ML SOLN COMPARISON:  January 25, 2024. FINDINGS: Lower chest: No acute abnormality. Hepatobiliary: No focal liver abnormality is seen. No gallstones, gallbladder wall thickening, or biliary dilatation. Pancreas: Unremarkable. No pancreatic ductal dilatation or surrounding inflammatory changes. Spleen: Normal in size without focal abnormality. Adrenals/Urinary Tract: Adrenal glands are unremarkable. Kidneys are normal, without renal calculi, focal lesion, or hydronephrosis. Bladder is unremarkable. Stomach/Bowel: Small sliding-type hiatal hernia. Appendix is unremarkable. No evidence of bowel obstruction. Sigmoid diverticulosis is noted with moderate wall thickening and mild inflammatory changes suggesting possible sigmoid diverticulitis. No definite abscess formation is noted. Vascular/Lymphatic: Aortic atherosclerosis. No enlarged abdominal or pelvic lymph nodes. Reproductive: Status post hysterectomy. No adnexal masses. Other: No ascites or hernia is noted. Musculoskeletal: No acute or significant osseous findings. IMPRESSION: Probable mild sigmoid diverticulitis. No  definite abscess formation is noted. Small sliding-type hiatal hernia. Aortic Atherosclerosis (ICD10-I70.0). Electronically Signed   By: Lynwood Landy Raddle M.D.   On: 01/30/2024 15:25   US  Venous Img Lower Bilateral (DVT) Result Date: 01/28/2024 CLINICAL DATA:  Tachycardia.  BILATERAL lower extremity edema. EXAM: Bilateral Lower Extremity Venous Doppler Ultrasound TECHNIQUE: Gray-scale sonography with compression, as well as color and duplex ultrasound, were performed to evaluate the deep venous system(s) from the level of the common femoral vein through the popliteal and proximal calf veins. COMPARISON:  None available FINDINGS:  VENOUS Normal compressibility of the common femoral, superficial femoral, and popliteal veins, as well as the visualized calf veins. Visualized portions of profunda femoral vein and great saphenous vein unremarkable. No filling defects to suggest DVT on grayscale or color Doppler imaging. Doppler waveforms show normal direction of venous flow, normal respiratory plasticity and response to augmentation. OTHER None. Limitations: Limited visualization of the RIGHT peroneal vein. IMPRESSION: No lower extremity DVT. Electronically Signed   By: Aliene Lloyd M.D.   On: 01/28/2024 16:23   CT ABDOMEN PELVIS W CONTRAST Result Date: 01/25/2024 CLINICAL DATA:  Abdominal pain, breast cancer on chemotherapy * Tracking Code: BO * EXAM: CT ABDOMEN AND PELVIS WITH CONTRAST TECHNIQUE: Multidetector CT imaging of the abdomen and pelvis was performed using the standard protocol following bolus administration of intravenous contrast. RADIATION DOSE REDUCTION: This exam was performed according to the departmental dose-optimization program which includes automated exposure control, adjustment of the mA and/or kV according to patient size and/or use of iterative reconstruction technique. CONTRAST:  OMNIPAQUE  IOHEXOL  300 MG/ML  SOLN COMPARISON:  PET-CT, 09/25/2022 FINDINGS: Lower chest: No acute abnormality.  Mild bibasilar pulmonary fibrosis. Small hiatal hernia. Hepatobiliary: No solid liver abnormality is seen. No gallstones, gallbladder wall thickening, or biliary dilatation. Pancreas: Unremarkable. No pancreatic ductal dilatation or surrounding inflammatory changes. Spleen: Normal in size without significant abnormality. Adrenals/Urinary Tract: Adrenal glands are unremarkable. Kidneys are normal, without renal calculi, solid lesion, or hydronephrosis. Bladder is unremarkable. Stomach/Bowel: Stomach is within normal limits. Appendix appears normal. Sigmoid diverticulosis with wall thickening, mucosal hyperenhancement, and adjacent fat stranding Vascular/Lymphatic: Aortic atherosclerosis. No enlarged abdominal or pelvic lymph nodes. Reproductive: No mass or other significant abnormality. Other: Small broad-base fat containing midline ventral hernia. No ascites. Musculoskeletal: No acute or significant osseous findings. IMPRESSION: 1. Sigmoid diverticulosis with wall thickening, mucosal hyperenhancement, and adjacent fat stranding, consistent with acute uncomplicated diverticulitis. 2. No evidence of lymphadenopathy or metastatic disease in the abdomen or pelvis. 3. Mild bibasilar pulmonary fibrosis. Aortic Atherosclerosis (ICD10-I70.0). Electronically Signed   By: Marolyn JONETTA Jaksch M.D.   On: 01/25/2024 16:48

## 2024-02-17 NOTE — Progress Notes (Signed)
 Patient has been examined by Dr. Ellin Saba. Vital signs and labs have been reviewed by MD - ANC, Creatinine, LFTs, hemoglobin, and platelets are within treatment parameters per M.D. - pt may proceed with treatment.  Primary RN and pharmacy notified.

## 2024-02-17 NOTE — Assessment & Plan Note (Signed)
 Currently en route to treatment , has a lot of fatigue , needs ongoing assistance with ADLs

## 2024-02-17 NOTE — Assessment & Plan Note (Signed)
 Elderly pt with chronic respiratory failure, hypertension, osteoarthritis currently undergoing chemo for breast cancer Incapable of ADL's without assistance, refer for  case management  to see how ongoing need can be met once current h/h situation ends ( 21 days post discharge)

## 2024-02-17 NOTE — Patient Instructions (Signed)
 [facility] - A DEPT OF [facility]  Discharge Instructions: Thank you for choosing [facility] to provide your oncology and hematology care.  If you have a lab appointment with the Cancer Center - please note that after April 8th, 2024, all labs will be drawn in the cancer center.  You do not have to check in or register with the main entrance as you have in the past but will complete your check-in in the cancer center.  Wear comfortable clothing and clothing appropriate for easy access to any Portacath or PICC line.   We strive to give you quality time with your provider. You may need to reschedule your appointment if you arrive late (15 or more minutes).  Arriving late affects you and other patients whose appointments are after yours.  Also, if you miss three or more appointments without notifying the office, you may be dismissed from the clinic at the provider's discretion.      For prescription refill requests, have your pharmacy contact our office and allow 72 hours for refills to be completed.    Today you received the following chemotherapy and/or immunotherapy agents Taxotere /Cytoxan .  Docetaxel  Injection What is this medication? DOCETAXEL  (doe se TAX el) treats some types of cancer. It works by slowing down the growth of cancer cells. This medicine may be used for other purposes; ask your health care provider or pharmacist if you have questions. COMMON BRAND NAME(S): BEIZRAY , Docefrez , Docivyx , Taxotere  What should I tell my care team before I take this medication? They need to know if you have any of these conditions: Kidney disease Liver disease Low white blood cell levels Tingling of the fingers or toes or other nerve disorder An unusual or allergic reaction to docetaxel , polysorbate 80, other medications, foods, dyes, or preservatives Pregnant or trying to get pregnant Breast-feeding How should I use this medication? This  medication is injected into a vein. It is given by your care team in a hospital or clinic setting. Talk to your care team about the use of this medication in children. Special care may be needed. Overdosage: If you think you have taken too much of this medicine contact a poison control center or emergency room at once. NOTE: This medicine is only for you. Do not share this medicine with others. What if I miss a dose? Keep appointments for follow-up doses. It is important not to miss your dose. Call your care team if you are unable to keep an appointment. What may interact with this medication? Do not take this medication with any of the following: Live virus vaccines This medication may also interact with the following: Certain antibiotics, such as clarithromycin, telithromycin Certain antivirals for HIV or hepatitis Certain medications for fungal infections, such as itraconazole, ketoconazole, voriconazole Grapefruit juice Nefazodone Supplements, such as St. John's wort This list may not describe all possible interactions. Give your health care provider a list of all the medicines, herbs, non-prescription drugs, or dietary supplements you use. Also tell them if you smoke, drink alcohol, or use illegal drugs. Some items may interact with your medicine. What should I watch for while using this medication? This medication may make you feel generally unwell. This is not uncommon as chemotherapy can affect healthy cells as well as cancer cells. Report any side effects. Continue your course of treatment even though you feel ill unless your care team tells you to stop. You may need blood work done while  you are taking this medication. This medication can cause serious side effects and infusion reactions. To reduce the risk, your care team may give you other medications to take before receiving this one. Be sure to follow the directions from your care team. This medication may increase your risk of  getting an infection. Call your care team for advice if you get a fever, chills, sore throat, or other symptoms of a cold or flu. Do not treat yourself. Try to avoid being around people who are sick. Avoid taking medications that contain aspirin, acetaminophen , ibuprofen , naproxen, or ketoprofen unless instructed by your care team. These medications may hide a fever. Be careful brushing or flossing your teeth or using a toothpick because you may get an infection or bleed more easily. If you have any dental work done, tell your dentist you are receiving this medication. Some products may contain alcohol. Ask your care team if this medication contains alcohol. Be sure to tell all care teams you are taking this medicine. Certain medications, like metronidazole  and disulfiram, can cause an unpleasant reaction when taken with alcohol. The reaction includes flushing, headache, nausea, vomiting, sweating, and increased thirst. The reaction can last from 30 minutes to several hours. This medication may affect your coordination, reaction time, or judgement. Do not drive or operate machinery until you know how this medication affects you. Sit up or stand slowly to reduce the risk of dizzy or fainting spells. Drinking alcohol with this medication can increase the risk of these side effects. Talk to your care team about your risk of cancer. You may be more at risk for certain types of cancer if you take this medication. Talk to your care team if you wish to become pregnant or think you might be pregnant. This medication can cause serious birth defects if taken during pregnancy or if you get pregnant within 2 months after stopping therapy. A negative pregnancy test is required before starting this medication. A reliable form of contraception is recommended while taking this medication and for 2 months after stopping it. Talk to your care team about reliable forms of contraception. Do not breast-feed while taking this  medication and for 1 week after stopping therapy. Use a condom during sex and for 4 months after stopping therapy. Tell your care team right away if you think your partner might be pregnant. This medication can cause serious birth defects. This medication may cause infertility. Talk to your care team if you are concerned about your fertility. What side effects may I notice from receiving this medication? Side effects that you should report to your care team as soon as possible: Allergic reactions--skin rash, itching, hives, swelling of the face, lips, tongue, or throat Change in vision such as blurry vision, seeing halos around lights, vision loss Infection--fever, chills, cough, or sore throat Infusion reactions--chest pain, shortness of breath or trouble breathing, feeling faint or lightheaded Low red blood cell level--unusual weakness or fatigue, dizziness, headache, trouble breathing Pain, tingling, or numbness in the hands or feet Painful swelling, warmth, or redness of the skin, blisters or sores at the infusion site Redness, blistering, peeling, or loosening of the skin, including inside the mouth Sudden or severe stomach pain, bloody diarrhea, fever, nausea, vomiting Swelling of the ankles, hands, or feet Tumor lysis syndrome (TLS)--nausea, vomiting, diarrhea, decrease in the amount of urine, dark urine, unusual weakness or fatigue, confusion, muscle pain or cramps, fast or irregular heartbeat, joint pain Unusual bruising or bleeding Side effects that usually  do not require medical attention (report to your care team if they continue or are bothersome): Change in nail shape, thickness, or color Change in taste Hair loss Increased tears This list may not describe all possible side effects. Call your doctor for medical advice about side effects. You may report side effects to FDA at 1-800-FDA-1088. Where should I keep my medication? This medication is given in a hospital or clinic. It  will not be stored at home. NOTE: This sheet is a summary. It may not cover all possible information. If you have questions about this medicine, talk to your doctor, pharmacist, or health care provider.  2024 Elsevier/Gold Standard (2021-09-13 00:00:00)   Cyclophosphamide  Injection What is this medication? CYCLOPHOSPHAMIDE  (sye kloe FOSS fa mide) treats some types of cancer. It works by slowing down the growth of cancer cells. This medicine may be used for other purposes; ask your health care provider or pharmacist if you have questions. COMMON BRAND NAME(S): Cyclophosphamide , Cytoxan , Neosar  What should I tell my care team before I take this medication? They need to know if you have any of these conditions: Heart disease Irregular heartbeat or rhythm Infection Kidney problems Liver disease Low blood cell levels (white cells, platelets, or red blood cells) Lung disease Previous radiation Trouble passing urine An unusual or allergic reaction to cyclophosphamide , other medications, foods, dyes, or preservatives Pregnant or trying to get pregnant Breast-feeding How should I use this medication? This medication is injected into a vein. It is given by your care team in a hospital or clinic setting. Talk to your care team about the use of this medication in children. Special care may be needed. Overdosage: If you think you have taken too much of this medicine contact a poison control center or emergency room at once. NOTE: This medicine is only for you. Do not share this medicine with others. What if I miss a dose? Keep appointments for follow-up doses. It is important not to miss your dose. Call your care team if you are unable to keep an appointment. What may interact with this medication? Amphotericin B Amiodarone Azathioprine Certain antivirals for HIV or hepatitis Certain medications for blood pressure, such as enalapril, lisinopril,  quinapril Cyclosporine Diuretics Etanercept Indomethacin Medications that relax muscles Metronidazole  Natalizumab Tamoxifen Warfarin This list may not describe all possible interactions. Give your health care provider a list of all the medicines, herbs, non-prescription drugs, or dietary supplements you use. Also tell them if you smoke, drink alcohol, or use illegal drugs. Some items may interact with your medicine. What should I watch for while using this medication? This medication may make you feel generally unwell. This is not uncommon as chemotherapy can affect healthy cells as well as cancer cells. Report any side effects. Continue your course of treatment even though you feel ill unless your care team tells you to stop. You may need blood work while you are taking this medication. This medication may increase your risk of getting an infection. Call your care team for advice if you get a fever, chills, sore throat, or other symptoms of a cold or flu. Do not treat yourself. Try to avoid being around people who are sick. Avoid taking medications that contain aspirin, acetaminophen , ibuprofen , naproxen, or ketoprofen unless instructed by your care team. These medications may hide a fever. Be careful brushing or flossing your teeth or using a toothpick because you may get an infection or bleed more easily. If you have any dental work done, tell your  dentist you are receiving this medication. Drink water  or other fluids as directed. Urinate often, even at night. Some products may contain alcohol. Ask your care team if this medication contains alcohol. Be sure to tell all care teams you are taking this medicine. Certain medicines, like metronidazole  and disulfiram, can cause an unpleasant reaction when taken with alcohol. The reaction includes flushing, headache, nausea, vomiting, sweating, and increased thirst. The reaction can last from 30 minutes to several hours. Talk to your care team if you  wish to become pregnant or think you might be pregnant. This medication can cause serious birth defects if taken during pregnancy and for 1 year after the last dose. A negative pregnancy test is required before starting this medication. A reliable form of contraception is recommended while taking this medication and for 1 year after the last dose. Talk to your care team about reliable forms of contraception. Do not father a child while taking this medication and for 4 months after the last dose. Use a condom during this time period. Do not breast-feed while taking this medication or for 1 week after the last dose. This medication may cause infertility. Talk to your care team if you are concerned about your fertility. Talk to your care team about your risk of cancer. You may be more at risk for certain types of cancer if you take this medication. What side effects may I notice from receiving this medication? Side effects that you should report to your care team as soon as possible: Allergic reactions--skin rash, itching, hives, swelling of the face, lips, tongue, or throat Dry cough, shortness of breath or trouble breathing Heart failure--shortness of breath, swelling of the ankles, feet, or hands, sudden weight gain, unusual weakness or fatigue Heart muscle inflammation--unusual weakness or fatigue, shortness of breath, chest pain, fast or irregular heartbeat, dizziness, swelling of the ankles, feet, or hands Heart rhythm changes--fast or irregular heartbeat, dizziness, feeling faint or lightheaded, chest pain, trouble breathing Infection--fever, chills, cough, sore throat, wounds that don't heal, pain or trouble when passing urine, general feeling of discomfort or being unwell Kidney injury--decrease in the amount of urine, swelling of the ankles, hands, or feet Liver injury--right upper belly pain, loss of appetite, nausea, light-colored stool, dark yellow or [place] Willadsen urine, yellowing skin or eyes,  unusual weakness or fatigue Low red blood cell level--unusual weakness or fatigue, dizziness, headache, trouble breathing Low sodium level--muscle weakness, fatigue, dizziness, headache, confusion Red or dark Amethyst Gainer urine Unusual bruising or bleeding Side effects that usually do not require medical attention (report to your care team if they continue or are bothersome): Hair loss Irregular menstrual cycles or spotting Loss of appetite Nausea Pain, redness, or swelling with sores inside the mouth or throat Vomiting This list may not describe all possible side effects. Call your doctor for medical advice about side effects. You may report side effects to FDA at 1-800-FDA-1088. Where should I keep my medication? This medication is given in a hospital or clinic. It will not be stored at home. NOTE: This sheet is a summary. It may not cover all possible information. If you have questions about this medicine, talk to your doctor, pharmacist, or health care provider.  2024 Elsevier/Gold Standard (2021-11-23 00:00:00)       To help prevent nausea and vomiting after your treatment, we encourage you to take your nausea medication as directed.  BELOW ARE SYMPTOMS THAT SHOULD BE REPORTED IMMEDIATELY: *FEVER GREATER THAN 100.4 F (38 C) OR HIGHER *CHILLS  OR SWEATING *NAUSEA AND VOMITING THAT IS NOT CONTROLLED WITH YOUR NAUSEA MEDICATION *UNUSUAL SHORTNESS OF BREATH *UNUSUAL BRUISING OR BLEEDING *URINARY PROBLEMS (pain or burning when urinating, or frequent urination) *BOWEL PROBLEMS (unusual diarrhea, constipation, pain near the anus) TENDERNESS IN MOUTH AND THROAT WITH OR WITHOUT PRESENCE OF ULCERS (sore throat, sores in mouth, or a toothache) UNUSUAL RASH, SWELLING OR PAIN  UNUSUAL VAGINAL DISCHARGE OR ITCHING   Items with * indicate a potential emergency and should be followed up as soon as possible or go to the Emergency Department if any problems should occur.  Please show the CHEMOTHERAPY  ALERT CARD or IMMUNOTHERAPY ALERT CARD at check-in to the Emergency Department and triage nurse.  Should you have questions after your visit or need to cancel or reschedule your appointment, please contact [facility] [place] - A DEPT OF [facility] [phone]  and follow the prompts.  Office hours are 8:00 a.m. to 4:30 p.m. Monday - Friday. Please note that voicemails left after 4:00 p.m. may not be returned until the following business day.  We are closed weekends and major holidays. You have access to a nurse at all times for urgent questions. Please call the main number to the clinic [phone] and follow the prompts.  For any non-urgent questions, you may also contact your provider using MyChart. We now offer e-Visits for anyone 69 and older to request care online for non-urgent symptoms. For details visit [email or web address].   Also download the MyChart app! Go to the app store, search MyChart, open the app, select [place], and log in with your MyChart username and password.

## 2024-02-17 NOTE — Assessment & Plan Note (Addendum)
 Resolved, apetite is fair, npo lower abdominal pain, bleeding or abnormality with BM Rept CBC and diff and bmp and egfr today

## 2024-02-17 NOTE — Assessment & Plan Note (Signed)
 Patient in for follow up of recent hospitalization. And TOC visit Discharge summary, and laboratory and radiology data are reviewed, and any questions or concerns  are discussed. Specific issues requiring follow up are specifically addressed.

## 2024-02-17 NOTE — Assessment & Plan Note (Signed)
 Will f/u blood pressure when she is at cancer center today, meds are reviewed

## 2024-02-17 NOTE — Patient Instructions (Addendum)
 F/U in 3 months  Nurse pls order CBc and diff and bmp and egfr to be drawn today at cancer ctr  I am referring you to case management to see how you will be able to get the assistance you will continue to need help  at home with activities of daily living, as in cooking, bathing, drsssing   ( nurse pls enter referral , I will sign)

## 2024-02-18 ENCOUNTER — Other Ambulatory Visit: Payer: Self-pay

## 2024-02-18 DIAGNOSIS — K5792 Diverticulitis of intestine, part unspecified, without perforation or abscess without bleeding: Secondary | ICD-10-CM | POA: Diagnosis not present

## 2024-02-18 DIAGNOSIS — D630 Anemia in neoplastic disease: Secondary | ICD-10-CM | POA: Diagnosis not present

## 2024-02-18 DIAGNOSIS — G4700 Insomnia, unspecified: Secondary | ICD-10-CM | POA: Diagnosis not present

## 2024-02-18 DIAGNOSIS — E785 Hyperlipidemia, unspecified: Secondary | ICD-10-CM | POA: Diagnosis not present

## 2024-02-18 DIAGNOSIS — I10 Essential (primary) hypertension: Secondary | ICD-10-CM | POA: Diagnosis not present

## 2024-02-18 DIAGNOSIS — K219 Gastro-esophageal reflux disease without esophagitis: Secondary | ICD-10-CM | POA: Diagnosis not present

## 2024-02-18 DIAGNOSIS — N179 Acute kidney failure, unspecified: Secondary | ICD-10-CM | POA: Diagnosis not present

## 2024-02-18 DIAGNOSIS — D72829 Elevated white blood cell count, unspecified: Secondary | ICD-10-CM | POA: Diagnosis not present

## 2024-02-18 DIAGNOSIS — C50912 Malignant neoplasm of unspecified site of left female breast: Secondary | ICD-10-CM | POA: Diagnosis not present

## 2024-02-19 ENCOUNTER — Institutional Professional Consult (permissible substitution): Admitting: Radiation Oncology

## 2024-02-19 DIAGNOSIS — E785 Hyperlipidemia, unspecified: Secondary | ICD-10-CM | POA: Diagnosis not present

## 2024-02-19 DIAGNOSIS — D72829 Elevated white blood cell count, unspecified: Secondary | ICD-10-CM | POA: Diagnosis not present

## 2024-02-19 DIAGNOSIS — G4700 Insomnia, unspecified: Secondary | ICD-10-CM | POA: Diagnosis not present

## 2024-02-19 DIAGNOSIS — N179 Acute kidney failure, unspecified: Secondary | ICD-10-CM | POA: Diagnosis not present

## 2024-02-19 DIAGNOSIS — K219 Gastro-esophageal reflux disease without esophagitis: Secondary | ICD-10-CM | POA: Diagnosis not present

## 2024-02-19 DIAGNOSIS — K5792 Diverticulitis of intestine, part unspecified, without perforation or abscess without bleeding: Secondary | ICD-10-CM | POA: Diagnosis not present

## 2024-02-19 DIAGNOSIS — D630 Anemia in neoplastic disease: Secondary | ICD-10-CM | POA: Diagnosis not present

## 2024-02-19 DIAGNOSIS — I10 Essential (primary) hypertension: Secondary | ICD-10-CM | POA: Diagnosis not present

## 2024-02-19 DIAGNOSIS — C50912 Malignant neoplasm of unspecified site of left female breast: Secondary | ICD-10-CM | POA: Diagnosis not present

## 2024-02-20 ENCOUNTER — Telehealth: Payer: Self-pay

## 2024-02-20 ENCOUNTER — Telehealth: Payer: Self-pay | Admitting: Family Medicine

## 2024-02-20 DIAGNOSIS — D72829 Elevated white blood cell count, unspecified: Secondary | ICD-10-CM | POA: Diagnosis not present

## 2024-02-20 DIAGNOSIS — K5792 Diverticulitis of intestine, part unspecified, without perforation or abscess without bleeding: Secondary | ICD-10-CM | POA: Diagnosis not present

## 2024-02-20 DIAGNOSIS — N179 Acute kidney failure, unspecified: Secondary | ICD-10-CM | POA: Diagnosis not present

## 2024-02-20 DIAGNOSIS — D630 Anemia in neoplastic disease: Secondary | ICD-10-CM | POA: Diagnosis not present

## 2024-02-20 DIAGNOSIS — G4700 Insomnia, unspecified: Secondary | ICD-10-CM | POA: Diagnosis not present

## 2024-02-20 DIAGNOSIS — K219 Gastro-esophageal reflux disease without esophagitis: Secondary | ICD-10-CM | POA: Diagnosis not present

## 2024-02-20 DIAGNOSIS — C50912 Malignant neoplasm of unspecified site of left female breast: Secondary | ICD-10-CM | POA: Diagnosis not present

## 2024-02-20 DIAGNOSIS — I10 Essential (primary) hypertension: Secondary | ICD-10-CM | POA: Diagnosis not present

## 2024-02-20 DIAGNOSIS — E785 Hyperlipidemia, unspecified: Secondary | ICD-10-CM | POA: Diagnosis not present

## 2024-02-20 NOTE — Progress Notes (Signed)
 Complex Care Management Note Care Guide Note  02/20/2024 Name: [name] MRN: [id number] DOB: 1945/08/30   Complex Care Management Outreach Attempts: An unsuccessful telephone outreach was attempted today to offer the patient information about available complex care management services.  Follow Up Plan:  Additional outreach attempts will be made to offer the patient complex care management information and services.   Encounter Outcome:  No Answer  [name] , RMA     [place]  [facility], [facility] Guide  Direct Dial: [phone]  Website: [email or web address]

## 2024-02-20 NOTE — Progress Notes (Signed)
 Complex Care Management Note  Care Guide Note 02/20/2024 Name: [name] MRN: [id number] DOB: 1946-03-19  [name] is a 78 y.o. year old female who sees Antonetta Rollene BRAVO, MD for primary care. I reached out to Doyal CHRISTELLA Louder by phone today to offer complex care management services.  [name] was given information about Complex Care Management services today including:   The Complex Care Management services include support from the care team which includes your Nurse Care Manager, Clinical Social Worker, or Pharmacist.  The Complex Care Management team is here to help remove barriers to the health concerns and goals most important to you. Complex Care Management services are voluntary, and the patient may decline or stop services at any time by request to their care team member.   Complex Care Management Consent Status: Patient agreed to services and verbal consent obtained.   Follow up plan:  Telephone appointment with complex care management team member scheduled for:  BSW 02/27/2024 RNCM 03/05/2024  Encounter Outcome:  Patient Scheduled  [name] , RMA     [place]  [facility], [place] [facility] Guide  Direct Dial: [phone]  Website: [email or web address]

## 2024-02-20 NOTE — Telephone Encounter (Signed)
 Copied from CRM [id number]. Topic: Clinical - Order For Equipment >> Feb 20, 2024 12:11 PM [name] wrote: Reason for CRM: Received call from [facility] per [place] ph: [phone] fax: [phone] requesting a Rotator for pt.

## 2024-02-23 ENCOUNTER — Encounter: Payer: Self-pay | Admitting: Emergency Medicine

## 2024-02-23 ENCOUNTER — Inpatient Hospital Stay
Admission: EM | Admit: 2024-02-23 | Discharge: 2024-03-02 | DRG: 853 | Disposition: A | Discharge status: Skilled Nursing Facility | Attending: Internal Medicine | Admitting: Internal Medicine

## 2024-02-23 ENCOUNTER — Emergency Department

## 2024-02-23 ENCOUNTER — Inpatient Hospital Stay: Attending: Hematology | Admitting: Dietician

## 2024-02-23 ENCOUNTER — Telehealth: Payer: Self-pay | Admitting: Dietician

## 2024-02-23 ENCOUNTER — Other Ambulatory Visit: Payer: Self-pay

## 2024-02-23 DIAGNOSIS — E872 Acidosis, unspecified: Secondary | ICD-10-CM | POA: Diagnosis present

## 2024-02-23 DIAGNOSIS — K5720 Diverticulitis of large intestine with perforation and abscess without bleeding: Secondary | ICD-10-CM | POA: Diagnosis present

## 2024-02-23 DIAGNOSIS — G4730 Sleep apnea, unspecified: Secondary | ICD-10-CM | POA: Diagnosis present

## 2024-02-23 DIAGNOSIS — A4102 Sepsis due to Methicillin resistant Staphylococcus aureus: Secondary | ICD-10-CM | POA: Diagnosis not present

## 2024-02-23 DIAGNOSIS — K660 Peritoneal adhesions (postprocedural) (postinfection): Secondary | ICD-10-CM | POA: Diagnosis not present

## 2024-02-23 DIAGNOSIS — K5900 Constipation, unspecified: Secondary | ICD-10-CM | POA: Diagnosis not present

## 2024-02-23 DIAGNOSIS — Z6841 Body Mass Index (BMI) 40.0 and over, adult: Secondary | ICD-10-CM | POA: Diagnosis not present

## 2024-02-23 DIAGNOSIS — K567 Ileus, unspecified: Secondary | ICD-10-CM | POA: Diagnosis not present

## 2024-02-23 DIAGNOSIS — E785 Hyperlipidemia, unspecified: Secondary | ICD-10-CM | POA: Diagnosis not present

## 2024-02-23 DIAGNOSIS — R918 Other nonspecific abnormal finding of lung field: Secondary | ICD-10-CM | POA: Diagnosis not present

## 2024-02-23 DIAGNOSIS — R6521 Severe sepsis with septic shock: Secondary | ICD-10-CM | POA: Diagnosis not present

## 2024-02-23 DIAGNOSIS — R110 Nausea: Secondary | ICD-10-CM | POA: Diagnosis not present

## 2024-02-23 DIAGNOSIS — D61810 Antineoplastic chemotherapy induced pancytopenia: Secondary | ICD-10-CM | POA: Diagnosis present

## 2024-02-23 DIAGNOSIS — R0989 Other specified symptoms and signs involving the circulatory and respiratory systems: Secondary | ICD-10-CM | POA: Diagnosis not present

## 2024-02-23 DIAGNOSIS — R54 Age-related physical debility: Secondary | ICD-10-CM | POA: Diagnosis present

## 2024-02-23 DIAGNOSIS — R109 Unspecified abdominal pain: Secondary | ICD-10-CM | POA: Diagnosis not present

## 2024-02-23 DIAGNOSIS — K219 Gastro-esophageal reflux disease without esophagitis: Secondary | ICD-10-CM | POA: Diagnosis present

## 2024-02-23 DIAGNOSIS — Z79899 Other long term (current) drug therapy: Secondary | ICD-10-CM

## 2024-02-23 DIAGNOSIS — Z7982 Long term (current) use of aspirin: Secondary | ICD-10-CM

## 2024-02-23 DIAGNOSIS — Z90710 Acquired absence of both cervix and uterus: Secondary | ICD-10-CM

## 2024-02-23 DIAGNOSIS — Z8249 Family history of ischemic heart disease and other diseases of the circulatory system: Secondary | ICD-10-CM

## 2024-02-23 DIAGNOSIS — M6259 Muscle wasting and atrophy, not elsewhere classified, multiple sites: Secondary | ICD-10-CM | POA: Diagnosis not present

## 2024-02-23 DIAGNOSIS — G4700 Insomnia, unspecified: Secondary | ICD-10-CM | POA: Diagnosis present

## 2024-02-23 DIAGNOSIS — I10 Essential (primary) hypertension: Secondary | ICD-10-CM | POA: Diagnosis present

## 2024-02-23 DIAGNOSIS — A4181 Sepsis due to Enterococcus: Secondary | ICD-10-CM | POA: Diagnosis not present

## 2024-02-23 DIAGNOSIS — R6520 Severe sepsis without septic shock: Secondary | ICD-10-CM | POA: Diagnosis not present

## 2024-02-23 DIAGNOSIS — E569 Vitamin deficiency, unspecified: Secondary | ICD-10-CM | POA: Diagnosis not present

## 2024-02-23 DIAGNOSIS — F32A Depression, unspecified: Secondary | ICD-10-CM | POA: Diagnosis present

## 2024-02-23 DIAGNOSIS — E876 Hypokalemia: Secondary | ICD-10-CM | POA: Diagnosis not present

## 2024-02-23 DIAGNOSIS — K5792 Diverticulitis of intestine, part unspecified, without perforation or abscess without bleeding: Secondary | ICD-10-CM | POA: Diagnosis not present

## 2024-02-23 DIAGNOSIS — R0602 Shortness of breath: Secondary | ICD-10-CM | POA: Diagnosis not present

## 2024-02-23 DIAGNOSIS — A419 Sepsis, unspecified organism: Principal | ICD-10-CM | POA: Diagnosis present

## 2024-02-23 DIAGNOSIS — K5790 Diverticulosis of intestine, part unspecified, without perforation or abscess without bleeding: Secondary | ICD-10-CM | POA: Diagnosis not present

## 2024-02-23 DIAGNOSIS — K439 Ventral hernia without obstruction or gangrene: Secondary | ICD-10-CM | POA: Diagnosis present

## 2024-02-23 DIAGNOSIS — Z17421 Hormone receptor negative with human epidermal growth factor receptor 2 negative status: Secondary | ICD-10-CM | POA: Insufficient documentation

## 2024-02-23 DIAGNOSIS — Z1501 Genetic susceptibility to malignant neoplasm of breast: Secondary | ICD-10-CM

## 2024-02-23 DIAGNOSIS — B379 Candidiasis, unspecified: Secondary | ICD-10-CM | POA: Diagnosis not present

## 2024-02-23 DIAGNOSIS — I25119 Atherosclerotic heart disease of native coronary artery with unspecified angina pectoris: Secondary | ICD-10-CM | POA: Diagnosis not present

## 2024-02-23 DIAGNOSIS — J45909 Unspecified asthma, uncomplicated: Secondary | ICD-10-CM | POA: Diagnosis present

## 2024-02-23 DIAGNOSIS — Z833 Family history of diabetes mellitus: Secondary | ICD-10-CM

## 2024-02-23 DIAGNOSIS — R000 Tachycardia, unspecified: Secondary | ICD-10-CM | POA: Diagnosis not present

## 2024-02-23 DIAGNOSIS — D709 Neutropenia, unspecified: Secondary | ICD-10-CM | POA: Diagnosis not present

## 2024-02-23 DIAGNOSIS — C50311 Malignant neoplasm of lower-inner quadrant of right female breast: Secondary | ICD-10-CM | POA: Insufficient documentation

## 2024-02-23 DIAGNOSIS — K5732 Diverticulitis of large intestine without perforation or abscess without bleeding: Secondary | ICD-10-CM | POA: Diagnosis not present

## 2024-02-23 DIAGNOSIS — K651 Peritoneal abscess: Secondary | ICD-10-CM | POA: Diagnosis present

## 2024-02-23 DIAGNOSIS — R2681 Unsteadiness on feet: Secondary | ICD-10-CM | POA: Diagnosis not present

## 2024-02-23 DIAGNOSIS — Z923 Personal history of irradiation: Secondary | ICD-10-CM

## 2024-02-23 DIAGNOSIS — D849 Immunodeficiency, unspecified: Secondary | ICD-10-CM | POA: Diagnosis present

## 2024-02-23 DIAGNOSIS — Z803 Family history of malignant neoplasm of breast: Secondary | ICD-10-CM

## 2024-02-23 DIAGNOSIS — K388 Other specified diseases of appendix: Secondary | ICD-10-CM | POA: Diagnosis not present

## 2024-02-23 DIAGNOSIS — R609 Edema, unspecified: Secondary | ICD-10-CM | POA: Diagnosis not present

## 2024-02-23 DIAGNOSIS — N179 Acute kidney failure, unspecified: Secondary | ICD-10-CM | POA: Diagnosis present

## 2024-02-23 DIAGNOSIS — K5780 Diverticulitis of intestine, part unspecified, with perforation and abscess without bleeding: Secondary | ICD-10-CM | POA: Diagnosis not present

## 2024-02-23 DIAGNOSIS — Z743 Need for continuous supervision: Secondary | ICD-10-CM | POA: Diagnosis not present

## 2024-02-23 DIAGNOSIS — K3580 Unspecified acute appendicitis: Secondary | ICD-10-CM | POA: Diagnosis present

## 2024-02-23 DIAGNOSIS — Z853 Personal history of malignant neoplasm of breast: Secondary | ICD-10-CM

## 2024-02-23 DIAGNOSIS — R69 Illness, unspecified: Secondary | ICD-10-CM | POA: Diagnosis not present

## 2024-02-23 DIAGNOSIS — R52 Pain, unspecified: Secondary | ICD-10-CM | POA: Diagnosis not present

## 2024-02-23 DIAGNOSIS — R9389 Abnormal findings on diagnostic imaging of other specified body structures: Secondary | ICD-10-CM | POA: Diagnosis not present

## 2024-02-23 DIAGNOSIS — Z96653 Presence of artificial knee joint, bilateral: Secondary | ICD-10-CM | POA: Diagnosis present

## 2024-02-23 DIAGNOSIS — T451X5A Adverse effect of antineoplastic and immunosuppressive drugs, initial encounter: Secondary | ICD-10-CM | POA: Diagnosis present

## 2024-02-23 DIAGNOSIS — R7989 Other specified abnormal findings of blood chemistry: Secondary | ICD-10-CM | POA: Diagnosis present

## 2024-02-23 DIAGNOSIS — Z741 Need for assistance with personal care: Secondary | ICD-10-CM | POA: Diagnosis not present

## 2024-02-23 DIAGNOSIS — I517 Cardiomegaly: Secondary | ICD-10-CM | POA: Diagnosis not present

## 2024-02-23 DIAGNOSIS — R0600 Dyspnea, unspecified: Secondary | ICD-10-CM | POA: Diagnosis not present

## 2024-02-23 DIAGNOSIS — A4189 Other specified sepsis: Secondary | ICD-10-CM | POA: Diagnosis not present

## 2024-02-23 DIAGNOSIS — C50919 Malignant neoplasm of unspecified site of unspecified female breast: Secondary | ICD-10-CM | POA: Diagnosis not present

## 2024-02-23 DIAGNOSIS — Z9221 Personal history of antineoplastic chemotherapy: Secondary | ICD-10-CM

## 2024-02-23 DIAGNOSIS — E559 Vitamin D deficiency, unspecified: Secondary | ICD-10-CM | POA: Diagnosis not present

## 2024-02-23 DIAGNOSIS — J309 Allergic rhinitis, unspecified: Secondary | ICD-10-CM | POA: Diagnosis not present

## 2024-02-23 LAB — BLOOD GAS, VENOUS
Acid-base deficit: 6 mmol/L — ABNORMAL HIGH (ref 0.0–2.0)
Bicarbonate: 17.6 mmol/L — ABNORMAL LOW (ref 20.0–28.0)
O2 Saturation: 68.2 %
Patient temperature: 37
pCO2, Ven: 29 mmHg — ABNORMAL LOW (ref 44–60)
pH, Ven: 7.39 (ref 7.25–7.43)
pO2, Ven: 44 mmHg (ref 32–45)

## 2024-02-23 LAB — CBC WITH DIFFERENTIAL/PLATELET
Abs Immature Granulocytes: 0.02 K/uL (ref 0.00–0.07)
Basophils Absolute: 0 K/uL (ref 0.0–0.1)
Basophils Relative: 2 %
Eosinophils Absolute: 0 K/uL (ref 0.0–0.5)
Eosinophils Relative: 0 %
HCT: 27.3 % — ABNORMAL LOW (ref 36.0–46.0)
Hemoglobin: 8.7 g/dL — ABNORMAL LOW (ref 12.0–15.0)
Immature Granulocytes: 4 %
Lymphocytes Relative: 56 %
Lymphs Abs: 0.3 K/uL — ABNORMAL LOW (ref 0.7–4.0)
MCH: 32 pg (ref 26.0–34.0)
MCHC: 31.9 g/dL (ref 30.0–36.0)
MCV: 100.4 fL — ABNORMAL HIGH (ref 80.0–100.0)
Monocytes Absolute: 0.1 K/uL (ref 0.1–1.0)
Monocytes Relative: 28 %
Neutro Abs: 0.1 K/uL — CL (ref 1.7–7.7)
Neutrophils Relative %: 10 %
Platelets: 76 K/uL — ABNORMAL LOW (ref 150–400)
RBC: 2.72 MIL/uL — ABNORMAL LOW (ref 3.87–5.11)
RDW: 16.1 % — ABNORMAL HIGH (ref 11.5–15.5)
WBC: 0.5 K/uL — CL (ref 4.0–10.5)
nRBC: 0 % (ref 0.0–0.2)

## 2024-02-23 LAB — URINALYSIS, W/ REFLEX TO CULTURE (INFECTION SUSPECTED)
Bacteria, UA: NONE SEEN
Bilirubin Urine: NEGATIVE
Glucose, UA: NEGATIVE mg/dL
Ketones, ur: NEGATIVE mg/dL
Nitrite: NEGATIVE
Protein, ur: NEGATIVE mg/dL
Specific Gravity, Urine: >1.046 — ABNORMAL HIGH (ref 1.005–1.030)
pH: 5 (ref 5.0–8.0)

## 2024-02-23 LAB — COMPREHENSIVE METABOLIC PANEL WITH GFR
ALT: 8 U/L (ref 0–44)
AST: 25 U/L (ref 15–41)
Albumin: 2.6 g/dL — ABNORMAL LOW (ref 3.5–5.0)
Alkaline Phosphatase: 45 U/L (ref 38–126)
Anion gap: 15 (ref 5–15)
BUN: 15 mg/dL (ref 8–23)
CO2: 20 mmol/L — ABNORMAL LOW (ref 22–32)
Calcium: 8.7 mg/dL — ABNORMAL LOW (ref 8.9–10.3)
Chloride: 106 mmol/L (ref 98–111)
Creatinine, Ser: 1.26 mg/dL — ABNORMAL HIGH (ref 0.44–1.00)
GFR, Estimated: 44 mL/min — ABNORMAL LOW (ref >60)
Glucose, Bld: 187 mg/dL — ABNORMAL HIGH (ref 70–99)
Potassium: 3.2 mmol/L — ABNORMAL LOW (ref 3.5–5.1)
Sodium: 141 mmol/L (ref 135–145)
Total Bilirubin: 1.4 mg/dL — ABNORMAL HIGH (ref 0.0–1.2)
Total Protein: 6 g/dL — ABNORMAL LOW (ref 6.5–8.1)

## 2024-02-23 LAB — PROTIME-INR
INR: 1.3 — ABNORMAL HIGH (ref 0.8–1.2)
Prothrombin Time: 17.4 s — ABNORMAL HIGH (ref 11.4–15.2)

## 2024-02-23 LAB — PATHOLOGIST SMEAR REVIEW

## 2024-02-23 LAB — LACTIC ACID, PLASMA
Lactic Acid, Venous: 4 mmol/L (ref 0.5–1.9)
Lactic Acid, Venous: 2.8 mmol/L (ref 0.5–1.9)
Lactic Acid, Venous: 3.3 mmol/L (ref 0.5–1.9)

## 2024-02-23 LAB — GLUCOSE, CAPILLARY
Glucose-Capillary: 112 mg/dL — ABNORMAL HIGH (ref 70–99)
Glucose-Capillary: 120 mg/dL — ABNORMAL HIGH (ref 70–99)

## 2024-02-23 LAB — MRSA NEXT GEN BY PCR, NASAL: MRSA by PCR Next Gen: DETECTED — AB

## 2024-02-23 MED ORDER — LACTATED RINGERS IV BOLUS: 2000.0000 mL | Freq: Once | INTRAVENOUS | Status: DC

## 2024-02-23 MED ORDER — ORAL CARE MOUTH RINSE: 15.0000 mL | OROMUCOSAL | Status: DC | PRN

## 2024-02-23 MED ORDER — MORPHINE SULFATE (PF) 2 MG/ML IV SOLN: 2.0000 mg | INTRAVENOUS | Status: DC | PRN

## 2024-02-23 MED ORDER — SODIUM CHLORIDE 0.9 % IV BOLUS
1000.0000 mL | Freq: Once | INTRAVENOUS | Status: AC
  Administered 2024-02-23: 1000 mL via INTRAVENOUS

## 2024-02-23 MED ORDER — PIPERACILLIN-TAZOBACTAM 3.375 G IVPB
3.3750 g | Freq: Once | INTRAVENOUS | Status: AC
  Administered 2024-02-23: 3.375 g via INTRAVENOUS
  Filled 2024-02-23: qty 50

## 2024-02-23 MED ORDER — SODIUM CHLORIDE 0.9 % IV BOLUS
2000.0000 mL | Freq: Once | INTRAVENOUS | Status: AC
  Administered 2024-02-23: 2000 mL via INTRAVENOUS

## 2024-02-23 MED ORDER — POTASSIUM CHLORIDE CRYS ER 20 MEQ PO TBCR: 40.0000 meq | EXTENDED_RELEASE_TABLET | Freq: Once | ORAL | Status: DC

## 2024-02-23 MED ORDER — IOHEXOL 300 MG/ML  SOLN
100.0000 mL | Freq: Once | INTRAMUSCULAR | Status: AC | PRN
  Administered 2024-02-23: 100 mL via INTRAVENOUS

## 2024-02-23 MED ORDER — SODIUM CHLORIDE 0.9% FLUSH
10.0000 mL | Freq: Two times a day (BID) | INTRAVENOUS | Status: DC
  Administered 2024-02-23 – 2024-02-26 (×7): 10 mL
  Administered 2024-02-27: 20 mL
  Administered 2024-02-27: 10 mL
  Administered 2024-02-28: 20 mL
  Administered 2024-02-28 – 2024-02-29 (×3): 10 mL
  Administered 2024-03-01: 20 mL
  Administered 2024-03-01 (×2): 10 mL
  Administered 2024-03-01: 20 mL
  Administered 2024-03-02 (×2): 10 mL

## 2024-02-23 MED ORDER — ONDANSETRON HCL 4 MG/2ML IJ SOLN
4.0000 mg | Freq: Four times a day (QID) | INTRAMUSCULAR | Status: DC | PRN
  Administered 2024-02-28 – 2024-03-01 (×6): 4 mg via INTRAVENOUS
  Filled 2024-02-23 (×4): qty 2

## 2024-02-23 MED ORDER — POTASSIUM CHLORIDE 10 MEQ/100ML IV SOLN
10.0000 meq | INTRAVENOUS | Status: AC
  Administered 2024-02-23 (×3): 10 meq via INTRAVENOUS
  Filled 2024-02-23: qty 100

## 2024-02-23 MED ORDER — SODIUM CHLORIDE 0.9 % IV SOLN: INTRAVENOUS | Status: DC

## 2024-02-23 MED ORDER — HEPARIN SODIUM (PORCINE) 5000 UNIT/ML IJ SOLN
5000.0000 [IU] | Freq: Two times a day (BID) | INTRAMUSCULAR | Status: DC
  Administered 2024-02-23 – 2024-03-02 (×18): 5000 [IU] via SUBCUTANEOUS
  Filled 2024-02-23 (×15): qty 1

## 2024-02-23 MED ORDER — INSULIN ASPART 100 UNIT/ML IJ SOLN
1.0000 [IU] | INTRAMUSCULAR | Status: DC
  Administered 2024-02-24 (×2): 2 [IU] via SUBCUTANEOUS
  Administered 2024-02-25 (×3): 1 [IU] via SUBCUTANEOUS
  Administered 2024-02-25 – 2024-02-26 (×3): 2 [IU] via SUBCUTANEOUS
  Administered 2024-02-26 – 2024-02-29 (×5): 1 [IU] via SUBCUTANEOUS
  Administered 2024-02-29: 2 [IU] via SUBCUTANEOUS
  Administered 2024-02-29 – 2024-03-01 (×7): 1 [IU] via SUBCUTANEOUS
  Filled 2024-02-23 (×16): qty 1

## 2024-02-23 MED ORDER — HEPARIN SODIUM (PORCINE) 5000 UNIT/ML IJ SOLN: 5000.0000 [IU] | Freq: Three times a day (TID) | INTRAMUSCULAR | Status: DC

## 2024-02-23 MED ORDER — PIPERACILLIN-TAZOBACTAM 3.375 G IVPB
3.3750 g | Freq: Three times a day (TID) | INTRAVENOUS | Status: AC
  Administered 2024-02-23 – 2024-03-02 (×29): 3.375 g via INTRAVENOUS
  Filled 2024-02-23 (×24): qty 50

## 2024-02-23 MED ORDER — ONDANSETRON HCL 4 MG PO TABS: 4.0000 mg | ORAL_TABLET | Freq: Four times a day (QID) | ORAL | Status: DC | PRN

## 2024-02-23 MED ORDER — MORPHINE SULFATE (PF) 4 MG/ML IV SOLN
4.0000 mg | Freq: Once | INTRAVENOUS | Status: AC
  Administered 2024-02-23: 4 mg via INTRAVENOUS
  Filled 2024-02-23: qty 1

## 2024-02-23 MED ORDER — HYDROMORPHONE HCL 1 MG/ML IJ SOLN
0.5000 mg | INTRAMUSCULAR | Status: DC | PRN
  Administered 2024-02-23: 1 mg via INTRAVENOUS
  Filled 2024-02-23: qty 1

## 2024-02-23 MED ORDER — VANCOMYCIN HCL 750 MG/150ML IV SOLN
750.0000 mg | INTRAVENOUS | Status: DC
  Administered 2024-02-24 – 2024-02-28 (×5): 750 mg via INTRAVENOUS
  Filled 2024-02-23 (×6): qty 150

## 2024-02-23 MED ORDER — FILGRASTIM-AAFI 300 MCG/0.5ML IJ SOSY
300.0000 ug | PREFILLED_SYRINGE | Freq: Once | INTRAMUSCULAR | Status: AC
  Administered 2024-02-24: 300 ug via SUBCUTANEOUS
  Filled 2024-02-23 (×2): qty 0.5

## 2024-02-23 MED ORDER — VANCOMYCIN HCL 1750 MG/350ML IV SOLN
1750.0000 mg | Freq: Once | INTRAVENOUS | Status: AC
  Administered 2024-02-23: 1750 mg via INTRAVENOUS
  Filled 2024-02-23: qty 350

## 2024-02-23 MED ORDER — FILGRASTIM-AAFI 480 MCG/0.8ML IJ SOSY
480.0000 ug | PREFILLED_SYRINGE | Freq: Once | INTRAMUSCULAR | Status: AC
  Administered 2024-02-23: 480 ug via SUBCUTANEOUS
  Filled 2024-02-23: qty 0.8

## 2024-02-23 MED ORDER — MORPHINE SULFATE (PF) 2 MG/ML IV SOLN
2.0000 mg | INTRAVENOUS | Status: AC | PRN
  Administered 2024-02-24: 2 mg via INTRAVENOUS
  Filled 2024-02-23: qty 1

## 2024-02-23 MED ORDER — HYDROCODONE-ACETAMINOPHEN 5-325 MG PO TABS
1.0000 | ORAL_TABLET | ORAL | Status: DC | PRN
  Administered 2024-02-23 – 2024-02-24 (×2): 1 via ORAL
  Filled 2024-02-23: qty 2
  Filled 2024-02-23: qty 1

## 2024-02-23 MED ORDER — MUPIROCIN 2 % EX OINT
1.0000 | TOPICAL_OINTMENT | Freq: Two times a day (BID) | CUTANEOUS | Status: AC
  Administered 2024-02-23 – 2024-02-28 (×10): 1 via NASAL
  Filled 2024-02-23: qty 22

## 2024-02-23 MED ORDER — ONDANSETRON HCL 4 MG/2ML IJ SOLN
4.0000 mg | Freq: Once | INTRAMUSCULAR | Status: AC
  Administered 2024-02-23: 4 mg via INTRAVENOUS
  Filled 2024-02-23: qty 2

## 2024-02-23 MED ORDER — CHLORHEXIDINE GLUCONATE CLOTH 2 % EX PADS
6.0000 | MEDICATED_PAD | Freq: Every day | CUTANEOUS | Status: DC
  Administered 2024-02-23 – 2024-03-02 (×12): 6 via TOPICAL

## 2024-02-23 MED ORDER — SODIUM CHLORIDE 0.9% FLUSH: 10.0000 mL | INTRAVENOUS | Status: DC | PRN

## 2024-02-23 NOTE — Consult Note (Signed)
 NAME:  [name], MRN:  [id number], DOB:  [birth year], LOS: 0 ADMISSION DATE:  02/23/2024, CONSULTATION DATE:  02/23/2024 REFERRING MD:  Ivonne Mustache, MD, CHIEF COMPLAINT:  Hypotension  History of Present Illness:   78 year old female with history of breast cancer (last chemotherapy a week ago) recently admitted and treated with diverticulitis who presents to the hospital with worsening abdominal pain and subsequently developed hypotension.  Patient reported abdominal pain on presentation. Given history of diverticulitis, she underwent a CT scan of the abdomen pelvis confirming diverticulitis with some findings of gas concerning for developing collections/abscess. Blood work was notable for elevated lactic acid as well as neutropenia. Blood cultures were drawn, she was resuscitated with IV fluids, received antibiotics, and admitted to [facility] for further care. General surgery was consulted and recommended conservative management for the time being. With persistent tachycardia and developing hypotension, PCCM is consulted to help guide management of sepsis.  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 She is currently on adjuvant chemotherapy. Last chemotherapy was last week 7/29. She was admitted 01/25/2024 with acute diverticulitis that was treated with broad spectrum antibiotics and with improvement. CT at that point did not show abscess formation.  Pertinent  Medical History  -Stage IIa breast cancer -History of Diverticulitis  Significant Hospital Events: Including procedures, antibiotic start and stop dates in addition to other pertinent events   02/23/2024: admitted with diverticulitis and hypotension. Transfer to ICU.  Objective    Blood pressure (!) 155/71, pulse (!) 135, temperature 99.7 F (37.6 C), temperature source Oral, resp. rate 20, height 5' (1.524 m), weight 79 kg, SpO2 97%.        Intake/Output  Summary (Last 24 hours) at 02/23/2024 1820 Last data filed at 02/23/2024 1457 Gross per 24 hour  Intake 2400 ml  Output --  Net 2400 ml   Filed Weights   02/23/24 1030  Weight: 79 kg    Examination: Physical Exam Constitutional:      General: She is not in acute distress.    Appearance: She is ill-appearing.  Cardiovascular:     Pulses: Normal pulses.     Heart sounds: Normal heart sounds.  Pulmonary:     Effort: Pulmonary effort is normal.     Breath sounds: Normal breath sounds.  Abdominal:     General: There is no distension.     Palpations: Abdomen is soft.     Tenderness: There is abdominal tenderness.  Neurological:     General: No focal deficit present.     Mental Status: She is alert and oriented to person, place, and time. Mental status is at baseline.      Assessment and Plan   37F with history of breast CA, on chemo, presenting with septic shock and febrile neutropenia secondary to diverticulitis.  #Septic Shock #Febrile Neutropenia #Breast Cancer on chemotherapy #Sigmoid Diverticulitis #AKI  Neuro - patient at baseline mental status, GCS 15. Avoiding psychotropic meds as able. CV - septic shock in the setting of neutropenia due to sigmoid diverticulitis. Will check lactic acid and VBG to assess for acidosis and perfusion. Received 30 cc/kg of IV fluids and antibiotics have been ordered/administered. While her MAP is currently 65, she is at high risk for further decompensation given neutropenia at which point we will initiate nor-epinephrine  for vasopressor support. Pulm - on room air without active issues. She does report some increased shortness of breath which could be  compensatory for acidosis. Renal - AKI in the setting of septic shock. She has received IV fluids. Will monitor and avoid nephrotoxins. Replete electrolytes as indicated. GI - sigmoid diverticulitis resulting in septic shock. General surgery consulted, recs appreciated. NPO for now. Hem/Onc -  Stage II breast ca on chemo, now with febrile neutropenia and infection. Will administer filgrastim . Heparin  SubQ bid for DVT prophylaxis. Closely monitor platelet count. Endo - ICU glycemic protocol. ID - on broad spectrum antibiotics with Zosyn /Vanc, blood cultures drawn.  Best Practice (right click and Reselect all SmartList Selections daily)   Diet/type: NPO DVT prophylaxis prophylactic heparin   Pressure ulcer(s): N/A GI prophylaxis: N/A Lines: Central line and yes and it is still needed Foley:  N/A Code Status:  full code Last date of multidisciplinary goals of care discussion [02/23/2024]  Labs   CBC: Recent Labs  Lab 02/17/24 0938 02/23/24 1058  WBC 9.0 0.5*  NEUTROABS 5.6 0.1*  HGB 9.9* 8.7*  HCT 31.3* 27.3*  MCV 101.0* 100.4*  PLT 210 76*    Basic Metabolic Panel: Recent Labs  Lab 02/17/24 0938 02/23/24 1058  NA 140 141  K 3.2* 3.2*  CL 108 106  CO2 22 20*  GLUCOSE 90 187*  BUN 8 15  CREATININE 0.62 1.26*  CALCIUM  8.7* 8.7*  MG 1.7  --    GFR: Estimated Creatinine Clearance: 35.3 mL/min (A) (by C-G formula based on SCr of 1.26 mg/dL (H)). Recent Labs  Lab 02/17/24 0938 02/23/24 1058 02/23/24 1222  WBC 9.0 0.5*  --   LATICACIDVEN  --  4.0* 2.8*    Liver Function Tests: Recent Labs  Lab 02/17/24 0938 02/23/24 1058  AST 20 25  ALT 10 8  ALKPHOS 51 45  BILITOT 1.1 1.4*  PROT 6.3* 6.0*  ALBUMIN  3.0* 2.6*   No results for input(s): LIPASE, AMYLASE in the last 168 hours. No results for input(s): AMMONIA in the last 168 hours.  ABG    Component Value Date/Time   PHART 7.429 06/10/2013 1249   PCO2ART 37.5 06/10/2013 1249   PO2ART 99.6 06/10/2013 1249   HCO3 24.4 (H) 06/10/2013 1249   TCO2 21.6 06/10/2013 1249   O2SAT 97.5 06/10/2013 1249     Coagulation Profile: Recent Labs  Lab 02/23/24 1058  INR 1.3*    Cardiac Enzymes: No results for input(s): CKTOTAL, CKMB, CKMBINDEX, TROPONINI in the last 168  hours.  HbA1C: Hgb A1c MFr Bld  Date/Time Value Ref Range Status  06/27/2023 11:55 AM 5.8 (H) 4.8 - 5.6 % Final    Comment:             Prediabetes: 5.7 - 6.4          Diabetes: >6.4          Glycemic control for adults with diabetes: <7.0   11/28/2022 10:23 AM 6.1 (H) 4.8 - 5.6 % Final    Comment:             Prediabetes: 5.7 - 6.4          Diabetes: >6.4          Glycemic control for adults with diabetes: <7.0     CBG: No results for input(s): GLUCAP in the last 168 hours.  Review of Systems:   Review of Systems  Constitutional:  Positive for chills. Negative for fever and weight loss.  Respiratory:  Positive for shortness of breath. Negative for cough.   Cardiovascular:  Negative for chest pain.  Gastrointestinal:  Positive for  abdominal pain.     Past Medical History:  She,  has a past medical history of Asthma, Back pain, BRCA2 gene mutation positive (10/13/2023), Depression, Diverticulosis of colon, GERD (gastroesophageal reflux disease), Hyperlipidemia, Hypertension, Obesity, Osteoarthritis of knees, bilateral, and Sleep apnea.   Surgical History:   Past Surgical History:  Procedure Laterality Date   ABDOMINAL HYSTERECTOMY     AXILLARY SENTINEL NODE BIOPSY Right 10/22/2023   Procedure: BIOPSY, LYMPH NODE, SENTINEL, AXILLARY;  Surgeon: Evonnie Dorothyann LABOR, DO;  Location: [facility];  Service: General;  Laterality: Right;   BREAST BIOPSY Right 09/09/2023   US  RT BREAST BX W LOC DEV 1ST LESION IMG BX SPEC US  GUIDE 09/09/2023 Lennon Nest, MD [facility]   BREAST LUMPECTOMY WITH RADIO FREQUENCY LOCALIZER Right 10/22/2023   Procedure: BREAST LUMPECTOMY WITH RADIO FREQUENCY LOCALIZER;  Surgeon: Evonnie Dorothyann LABOR, DO;  Location: [facility];  Service: General;  Laterality: Right;   COLONOSCOPY  07/23/2003   Dr. Keven Smith:numerous large scattered diverticula   COLONOSCOPY N/A 11/15/2013   Dr. Harvey: moderate diverticula, small internal hemorrhoids, redudant colon.  Next TCS in 2025 with overtube.    ESOPHAGOGASTRODUODENOSCOPY (EGD) WITH ESOPHAGEAL DILATION N/A 11/15/2013   Dr. Harvey: stricture at GE junction s/p dilation. moderate erosive gastritis, negative H.pylori   MULTIPLE TOOTH EXTRACTIONS  2024   PORTACATH PLACEMENT Left 10/22/2023   Procedure: INSERTION, TUNNELED CENTRAL VENOUS DEVICE, WITH PORT;  Surgeon: Evonnie Dorothyann LABOR, DO;  Location: [facility];  Service: General;  Laterality: Left;   TOTAL KNEE ARTHROPLASTY  06/01/2004   left / Dr. Margrette   TOTAL KNEE ARTHROPLASTY  11/29/2003   right / Dr. Margrette   TUBAL LIGATION       Social History:   reports that she has never smoked. She has never been exposed to tobacco smoke. She has never used smokeless tobacco. She reports that she does not drink alcohol and does not use drugs.   Family History:  Her family history includes Aneurysm in her father; Breast cancer in her maternal aunt; Cancer in her maternal aunt; Diabetes in her brother, brother, brother, father, and mother; Heart disease in her mother; Hypertension in her brother, brother, mother, and son; Prostate cancer in her maternal uncle.   Allergies No Known Allergies   Home Medications  Prior to Admission medications   Medication Sig Start Date End Date Taking? Authorizing Provider  albuterol  (VENTOLIN  HFA) 108 (90 Base) MCG/ACT inhaler Inhale 2 puffs into the lungs every 6 (six) hours as needed for wheezing or shortness of breath. 10/14/23  Yes Antonetta Rollene BRAVO, MD  aspirin EC 81 MG tablet Take 81 mg by mouth daily. Swallow whole.   Yes [provider]  atorvastatin  (LIPITOR) 20 MG tablet Take 1 tablet (20 mg total) by mouth daily. 05/29/23  Yes Antonetta Rollene BRAVO, MD  BIOTIN PO Take 1 tablet by mouth daily.   Yes [provider]  bisacodyl  5 MG EC tablet Take one tablet by mouth every 3 days as needed, for constipation. Repeat once after 6 hours if still no bowel movement 01/15/24  Yes Antonetta Rollene BRAVO, MD   Cholecalciferol (VITAMIN D3) 125 MCG (5000 UT) CAPS Take 1 capsule (5,000 Units total) by mouth daily. 05/29/23  Yes Antonetta Rollene BRAVO, MD  clotrimazole -betamethasone  (LOTRISONE ) cream Apply cream twice daily to rash in groin for 10 days, then as needed 05/29/23  Yes Antonetta Rollene BRAVO, MD  docusate sodium  (COLACE) 100 MG capsule Take 1 capsule (100 mg total) by mouth  2 (two) times daily. 10/22/23 10/21/24 Yes Pappayliou, Dorothyann A, DO  famotidine (PEPCID) 10 MG tablet Take 10 mg by mouth 2 (two) times daily.   Yes [provider]  furosemide  (LASIX ) 40 MG tablet Take 40 mg by mouth daily as needed. 02/11/24  Yes [provider]  gabapentin  (NEURONTIN ) 100 MG capsule Take 1 capsule (100 mg total) by mouth at bedtime. 07/01/23  Yes Antonetta Rollene BRAVO, MD  Hydrocortisone (GERHARDT'S BUTT CREAM) CREA Apply 1 application topically 3 (three) times daily. 03/30/19  Yes Moishe Chiquita HERO, NP  lidocaine -prilocaine  (EMLA ) cream Apply to affected area once 11/24/23  Yes Katragadda, Sreedhar, MD  losartan  (COZAAR ) 100 MG tablet Take 1 tablet (100 mg total) by mouth daily. 02/04/24 03/05/24 Yes Dezii, Alexandra, DO  losartan -hydrochlorothiazide  (HYZAAR) 100-25 MG tablet Take 1 tablet by mouth daily. 02/11/24  Yes [provider]  magnesium  30 MG tablet Take one tablet by mouth once daily 05/29/23  Yes Antonetta Rollene BRAVO, MD  megestrol  (MEGACE ) 40 MG/ML suspension Take 10 mLs (400 mg total) by mouth 2 (two) times daily. 02/11/24  Yes Katragadda, Sreedhar, MD  Melatonin 3 MG CAPS Take 1 capsule (3 mg total) by mouth at bedtime. 05/29/23  Yes Antonetta Rollene BRAVO, MD  metoprolol  succinate (TOPROL -XL) 50 MG 24 hr tablet TAKE 1 TABLET BY MOUTH  DAILY WITH OR IMMEDIATELY  FOLLOWING A MEAL 05/29/23  Yes Antonetta Rollene BRAVO, MD  olopatadine  (PATANOL) 0.1 % ophthalmic solution Place 1 drop into both eyes 2 (two) times daily. 05/29/23  Yes Antonetta Rollene BRAVO, MD  omeprazole  (PRILOSEC) 20 MG capsule Take 1  capsule (20 mg total) by mouth daily. 05/29/23  Yes Antonetta Rollene BRAVO, MD  ondansetron  (ZOFRAN ) 4 MG tablet Take 1 tablet (4 mg total) by mouth daily as needed for nausea or vomiting. 10/22/23 10/21/24 Yes Pappayliou, Dorothyann A, DO  polyethylene glycol powder (GLYCOLAX /MIRALAX ) 17 GM/SCOOP powder Take 17 g by mouth 2 (two) times daily as needed. 01/15/24  Yes Antonetta Rollene BRAVO, MD  potassium chloride  SA (KLOR-CON  M) 20 MEQ tablet Take 20 mEq by mouth daily. 02/11/24  Yes [provider]  prochlorperazine  (COMPAZINE ) 10 MG tablet Take 1 tablet (10 mg total) by mouth every 6 (six) hours as needed for nausea or vomiting. 11/24/23  Yes Rogers Hai, MD  pyridOXINE  (VITAMIN B-6) 100 MG tablet Take 200 mg by mouth daily.   Yes [provider]  sertraline  (ZOLOFT ) 50 MG tablet Take 1 tablet (50 mg total) by mouth daily. 08/20/23  Yes Antonetta Rollene BRAVO, MD  temazepam  (RESTORIL ) 15 MG capsule Take 1 capsule (15 mg total) by mouth at bedtime as needed for sleep. 10/14/23  Yes Antonetta Rollene BRAVO, MD  cycloPHOSphamide  (CYTOXAN  IJ) Inject as directed. Patient not taking: Reported on 02/23/2024    [provider]  DOCEtaxel  (TAXOTERE  IV) Inject into the vein. Patient not taking: Reported on 02/23/2024    [provider]  Palonosetron  HCl (ALOXI  IV) Inject into the vein. Patient not taking: Reported on 02/23/2024    [provider]     The patient is critically ill due to septic shock, febrile neutropenia.  Critical care was necessary to treat or prevent imminent or life-threatening deterioration. Critical care time was spent by me on the following activities: development of a treatment plan with the patient and/or surrogate as well as nursing, discussions with consultants, evaluation of the patient's response to treatment, examination of the patient, obtaining a history from the patient or surrogate,  ordering and performing treatments and interventions, ordering and  review of laboratory studies, ordering and review of radiographic studies, review of telemetry data including pulse oximetry, re-evaluation of patient's condition and participation in multidisciplinary rounds.   I personally spent 50 minutes providing critical care not including any separately billable procedures.   Belva November, MD [facility] 02/23/2024 6:57 PM

## 2024-02-23 NOTE — Consult Note (Signed)
 Patient ID: [name], female   DOB: 1946/07/07, 78 y.o.   MRN: [id number]  HPI [name] is a 78 y.o. female seen in consultation at the request of DR Jillian, she presents with acute onset of abdominal pain.  Of note she reports for the last 3 days her pain came back is intermittent moderate to severe intensity located in the lower abdomen.  It is sharp.  No specific aggravating or aggravating factors.  Of note she did have recent episode of diverticulitis and was discharged home on antibiotics 2 weeks ago.  She did have another CT scan that have personally reviewed showing evidence of diverticulitis with potentially developing phlegmon or abscess.  No overt free air. Her last chemo treatment was last week.  She was recently diagnosed with a right-sided breast cancer and already had lumpectomy and sentinel lymph node biopsy.  She did well from the surgery. 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 Son is by her bedside.  HPI  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   Depression    Diverticulosis of colon    GERD (gastroesophageal reflux disease)    Hyperlipidemia    Hypertension    Obesity    Osteoarthritis of knees, bilateral    Sleep apnea     Past Surgical History:  Procedure Laterality Date   ABDOMINAL HYSTERECTOMY     AXILLARY SENTINEL NODE BIOPSY Right 10/22/2023   Procedure: BIOPSY, LYMPH NODE, SENTINEL, AXILLARY;  Surgeon: Evonnie Dorothyann LABOR, DO;  Location: [facility];  Service: General;  Laterality: Right;   BREAST BIOPSY Right 09/09/2023   US  RT BREAST BX W LOC DEV 1ST LESION IMG BX SPEC US  GUIDE 09/09/2023 Lennon Nest, MD [facility]-UL[facility]   BREAST LUMPECTOMY WITH RADIO FREQUENCY LOCALIZER Right 10/22/2023   Procedure: BREAST LUMPECTOMY WITH RADIO FREQUENCY LOCALIZER;  Surgeon: Evonnie Dorothyann LABOR, DO;  Location: [facility];   Service: General;  Laterality: Right;   COLONOSCOPY  07/23/2003   Dr. Keven Smith:numerous large scattered diverticula   COLONOSCOPY N/A 11/15/2013   Dr. Harvey: moderate diverticula, small internal hemorrhoids, redudant colon. Next TCS in 2025 with overtube.    ESOPHAGOGASTRODUODENOSCOPY (EGD) WITH ESOPHAGEAL DILATION N/A 11/15/2013   Dr. Harvey: stricture at GE junction s/p dilation. moderate erosive gastritis, negative H.pylori   MULTIPLE TOOTH EXTRACTIONS  2024   PORTACATH PLACEMENT Left 10/22/2023   Procedure: INSERTION, TUNNELED CENTRAL VENOUS DEVICE, WITH PORT;  Surgeon: Evonnie Dorothyann LABOR, DO;  Location: [facility];  Service: General;  Laterality: Left;   TOTAL KNEE ARTHROPLASTY  06/01/2004   left / Dr. Margrette   TOTAL KNEE ARTHROPLASTY  11/29/2003   right / Dr. Margrette   TUBAL LIGATION      Family History  Problem Relation Age of Onset   Diabetes Mother    Hypertension Mother    Heart disease Mother    Aneurysm Father    Diabetes Father    Hypertension Brother    Diabetes Brother    Diabetes Brother    Diabetes Brother    Hypertension Brother    Breast cancer Maternal Aunt        dx >50   Cancer Maternal Aunt        unknown type; ? breast; dx >50?   Prostate cancer Maternal Uncle    Hypertension Son  Social History Social History   Tobacco Use   Smoking status: Never    Passive exposure: Never   Smokeless tobacco: Never  Vaping Use   Vaping status: Never Used  Substance Use Topics   Alcohol use: No   Drug use: No    No Known Allergies  Current Facility-Administered Medications  Medication Dose Route Frequency Provider Last Rate Last Admin   0.9 %  sodium chloride  infusion   Intravenous Continuous Jillian Buttery, MD 100 mL/hr at 02/23/24 1611 New Bag at 02/23/24 1611   Chlorhexidine  Gluconate Cloth 2 % PADS 6 each  6 each Topical Daily Adhikari, Buttery, MD       filgrastim -aafi (NIVESTYM ) injection 480 mcg  480 mcg Subcutaneous ONCE-1800 Adhikari,  Amrit, MD       HYDROcodone -acetaminophen  (NORCO/VICODIN) 5-325 MG per tablet 1-2 tablet  1-2 tablet Oral Q4H PRN Jillian Buttery, MD       HYDROmorphone  (DILAUDID ) injection 0.5-1 mg  0.5-1 mg Intravenous Q2H PRN Adhikari, Amrit, MD   1 mg at 02/23/24 1617   ondansetron  (ZOFRAN ) tablet 4 mg  4 mg Oral Q6H PRN Jillian Buttery, MD       Or   ondansetron  (ZOFRAN ) injection 4 mg  4 mg Intravenous Q6H PRN Adhikari, Amrit, MD       piperacillin -tazobactam (ZOSYN ) IVPB 3.375 g  3.375 g Intravenous Q8H Adhikari, Amrit, MD       potassium chloride  10 mEq in 100 mL IVPB  10 mEq Intravenous Q1 Hr x 3 Adhikari, Amrit, MD 100 mL/hr at 02/23/24 1623 10 mEq at 02/23/24 1623   sodium chloride  flush (NS) 0.9 % injection 10-40 mL  10-40 mL Intracatheter Q12H Adhikari, Amrit, MD       sodium chloride  flush (NS) 0.9 % injection 10-40 mL  10-40 mL Intracatheter PRN Jillian Buttery, MD       NOREEN ON 02/24/2024] vancomycin  (VANCOREADY) IVPB 750 mg/150 mL  750 mg Intravenous Q24H Adhikari, Amrit, MD         Review of Systems Full ROS  was asked and was negative except for the information on the HPI  Physical Exam Blood pressure (!) 155/71, pulse (!) 135, temperature 99.7 F (37.6 C), temperature source Oral, resp. rate 20, height 5' (1.524 m), weight 79 kg, SpO2 97%. CONSTITUTIONAL: Chronically ill. EYES: Pupils are equal, round, Sclera are non-icteric. EARS, NOSE, MOUTH AND THROAT: The oropharynx is clear. The oral mucosa is pink and moist. Hearing is intact to voice. LYMPH NODES:  Lymph nodes in the neck are normal. RESPIRATORY:  Lungs are clear. There is normal respiratory effort, with equal breath sounds bilaterally, and without pathologic use of accessory muscles. CARDIOVASCULAR: Heart is regular without murmurs, gallops, or rubs. GI: The abdomen is  soft, Tender to palpation LLQ w/o rebound or peritonitis, There are no palpable masses. There is no hepatosplenomegaly. There are normal bowel sounds in all  quadrants. Midline laparotomy scar GU: Rectal deferred.   MUSCULOSKELETAL: Normal muscle strength and tone. No cyanosis or edema.   SKIN: Turgor is good and there are no pathologic skin lesions or ulcers. NEUROLOGIC: Motor and sensation is grossly normal. Cranial nerves are grossly intact. PSYCH:  Oriented to person, place and time. Affect is normal.  Data Reviewed I have personally reviewed the patient's imaging, laboratory findings and medical records.    Assessment/Plan 78 year old female with recurrent diverticulitis in the setting of active chemotherapy for breast cancer.  Currently the patient is not peritonitic and does not need emergent  surgical intervention.  I am however a little bit worried about her condition given her immunosuppressive state.  We will continue to manage her with antibiotics fluid resuscitation.  They understand if she were not to improve she may need a Hartman's procedure with a colostomy. I do think that at some point in time you for able to control and manage her with antibiotic therapy she may benefit from elective sigmoid colectomy at some point in time. We will continue to follow her. All questions were answered I personally spent a total of 75 minutes in the care of the patient today including performing a medically appropriate exam/evaluation, counseling and educating, placing orders, referring and communicating with other health care professionals, documenting clinical information in the EHR, independently interpreting and reviewing images studies and coordinating care.    Laneta Luna, MD FACS General Surgeon 02/23/2024, 5:08 PM

## 2024-02-23 NOTE — Consult Note (Signed)
 Pharmacy Antibiotic Note  [name] is a 78 y.o. female admitted on 02/23/2024 with sepsis and intra-abdominal infection.  Pharmacy has been consulted for pip/tazo and vancomycin  dosing.  Plan: Patient received vancomycin  1750 mg x 1 in the ED. Will order vancomycin  750 mg q24H. Predicted AUC of 487. Vd 0.72. Scr 1.26. IBW. Goal AUC of 400-550.   Pip/tazo 3.375 mg q8H.   Height: 5' (152.4 cm) Weight: 79 kg (174 lb 2.6 oz) IBW/kg (Calculated) : 45.5  Temp (24hrs), Avg:98.4 F (36.9 C), Min:98.4 F (36.9 C), Max:98.4 F (36.9 C)  Recent Labs  Lab 02/17/24 0938 02/23/24 1058 02/23/24 1222  WBC 9.0 0.5*  --   CREATININE 0.62 1.26*  --   LATICACIDVEN  --  4.0* 2.8*    Estimated Creatinine Clearance: 35.3 mL/min (A) (by C-G formula based on SCr of 1.26 mg/dL (H)).    No Known Allergies   Thank you for allowing pharmacy to be a part of this patient's care.  Fumi Guadron S Marlow Berenguer 02/23/2024 2:54 PM

## 2024-02-23 NOTE — ED Provider Notes (Signed)
 [facility] Provider Note    Event Date/Time   First MD Initiated Contact with Patient 02/23/24 1033     (approximate)   History   Abdominal Pain   HPI  [name] is a 78 y.o. female who presents to the ED for evaluation of Abdominal Pain   I review a medical DC summary from 7/15.  Admitted for acute diverticulitis.  Managed medically.  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  Patient presents with her daughter-in-law for evaluation of a few days of recurrence and worsening bloody/mucousy stool   Physical Exam   Triage Vital Signs: ED Triage Vitals  Encounter Vitals Group     BP 02/23/24 1032 (!) 84/50     Girls Systolic BP Percentile --      Girls Diastolic BP Percentile --      Boys Systolic BP Percentile --      Boys Diastolic BP Percentile --      Pulse Rate 02/23/24 1031 (!) 111     Resp 02/23/24 1031 (!) 22     Temp 02/23/24 1031 98.4 F (36.9 C)     Temp Source 02/23/24 1031 Axillary     SpO2 02/23/24 1031 100 %     Weight 02/23/24 1030 174 lb 2.6 oz (79 kg)     Height 02/23/24 1030 5' (1.524 m)     Head Circumference --      Peak Flow --      Pain Score 02/23/24 1031 10     Pain Loc --      Pain Education --      Exclude from Growth Chart --     Most recent vital signs: Vitals:   02/23/24 1300 02/23/24 1330  BP: 126/65 135/66  Pulse: (!) 105 (!) 110  Resp: (!) 28 (!) 22  Temp:    SpO2: 100% 100%    General: Awake, no distress.  Appears uncomfortable CV:  Good peripheral perfusion.  Resp:  Normal effort.  Tachycardic and regular Abd:  No distention.  Lower abdominal tenderness MSK:  No deformity noted.  Neuro:  No focal deficits appreciated. Other:     ED Results / Procedures / Treatments   Labs (all labs ordered are listed, but only abnormal results are displayed) Labs Reviewed  COMPREHENSIVE METABOLIC PANEL WITH GFR - Abnormal; Notable for the following components:      Result Value    Potassium 3.2 (*)    CO2 20 (*)    Glucose, Bld 187 (*)    Creatinine, Ser 1.26 (*)    Calcium  8.7 (*)    Total Protein 6.0 (*)    Albumin  2.6 (*)    Total Bilirubin 1.4 (*)    GFR, Estimated 44 (*)    All other components within normal limits  LACTIC ACID, PLASMA - Abnormal; Notable for the following components:   Lactic Acid, Venous 4.0 (*)    All other components within normal limits  LACTIC ACID, PLASMA - Abnormal; Notable for the following components:   Lactic Acid, Venous 2.8 (*)    All other components within normal limits  CBC WITH DIFFERENTIAL/PLATELET - Abnormal; Notable for the following components:   WBC 0.5 (*)    RBC 2.72 (*)    Hemoglobin 8.7 (*)    HCT 27.3 (*)    MCV 100.4 (*)    RDW 16.1 (*)    Platelets 76 (*)    Neutro Abs 0.1 (*)  Lymphs Abs 0.3 (*)    All other components within normal limits  PROTIME-INR - Abnormal; Notable for the following components:   Prothrombin Time 17.4 (*)    INR 1.3 (*)    All other components within normal limits  CULTURE, BLOOD (ROUTINE X 2)  CULTURE, BLOOD (ROUTINE X 2)  URINE CULTURE  URINALYSIS, W/ REFLEX TO CULTURE (INFECTION SUSPECTED)  PATHOLOGIST SMEAR REVIEW    EKG   RADIOLOGY CXR interpreted by me without evidence of acute cardiopulmonary pathology. CT abdomen/pelvis interpreted me with pelvic stranding but no clear abscess  Official radiology report(s): CT ABDOMEN PELVIS W CONTRAST Result Date: 02/23/2024 EXAM: CT ABDOMEN AND PELVIS WITH CONTRAST 02/23/2024 01:57:47 PM TECHNIQUE: CT of the abdomen and pelvis was performed with the administration of intravenous contrast. Multiplanar reformatted images are provided for review. Automated exposure control, iterative reconstruction, and/or weight based adjustment of the mA/kV was utilized to reduce the radiation dose to as low as reasonably achievable. COMPARISON: 01/30/2024. CLINICAL HISTORY: Eval divertic and complications. Hx recent divertic, worsening  bloody/mucous stool, RLQ pain. Patient to ED via POV for abd pain. Having nausea and weakness for the past few days. Had chemo on Tuesday for breast cancer. Hx diverticulitis. FINDINGS: LOWER CHEST: Motion degradation at the lung bases. 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 GALLBLADDER AND BILE DUCTS: Gallbladder is unremarkable. No biliary ductal dilatation. SPLEEN: No acute abnormality. PANCREAS: No acute abnormality. ADRENAL GLANDS: No acute abnormality. KIDNEYS, URETERS AND BLADDER: No hydronephrosis. No perinephric or periureteral stranding. Urinary bladder is unremarkable. GI AND BOWEL: Tiny hiatal hernia. Moderate inflammation surrounding the sigmoid. pericolic fluid and gas, including at up to 3.5 cm on 66/2. Prominent pericolonic gas adjacent to more proximal sigmoid including on 63/2 . PERITONEUM AND RETROPERITONEUM: No ascites. No free air. VASCULATURE: Advanced abdominal aortic atherosclerosis. LYMPH NODES: No lymphadenopathy. REPRODUCTIVE ORGANS: Hysterectomy. BONES AND SOFT TISSUES: Lumbosacral spondylosis. Abdominal pelvic degradation secondary to patient body habitus and minimal motion. IMPRESSION: 1. Moderate inflammation surrounding the sigmoid, most consistent with diverticulitis. Foci of proximal and distal pericolonic fluid and gas may represent inflamed diverticula or early developing collections. Depending on clinical symptomatology, consider CT follow-up at 3 to 5 days. 2. Mild degradation as detailed above. Electronically signed by: Rockey Kilts MD 02/23/2024 02:23 PM EDT RP Workstation: [id number]   DG Chest Portable 1 View Result Date: 02/23/2024 CLINICAL DATA:  sepsis screen, breast CA patient, likely acute diverticulitis EXAM: PORTABLE CHEST - 1 VIEW COMPARISON:  January 31, 2024 FINDINGS: Left chest port in place terminating at the  brachiocephalic vein confluence, unchanged. Elevation of the right hemidiaphragm. No focal airspace consolidation, pleural effusion, or pneumothorax. Mild cardiomegaly. Tortuous aorta with aortic atherosclerosis. No acute fracture or destructive lesions. Multilevel thoracic osteophytosis. IMPRESSION: Mild cardiomegaly.  No acute cardiopulmonary abnormality. Electronically Signed   By: Rogelia Myers M.D.   On: 02/23/2024 11:47    PROCEDURES and INTERVENTIONS:  .Critical Care  Performed by: Claudene Rover, MD Authorized by: Claudene Rover, MD   Critical care provider statement:    Critical care time (minutes):  30   Critical care time was exclusive of:  Separately billable procedures and treating other patients   Critical care was necessary to treat or prevent imminent or life-threatening deterioration of the following conditions:  Sepsis   Critical care was time spent personally by me on  the following activities:  Development of treatment plan with patient or surrogate, discussions with consultants, evaluation of patient's response to treatment, examination of patient, ordering and review of laboratory studies, ordering and review of radiographic studies, ordering and performing treatments and interventions, pulse oximetry, re-evaluation of patient's condition and review of old charts .1-3 Lead EKG Interpretation  Performed by: Claudene Rover, MD Authorized by: Claudene Rover, MD     Interpretation: abnormal     ECG rate:  110   ECG rate assessment: tachycardic     Rhythm: sinus tachycardia     Ectopy: none     Conduction: normal     Medications  0.9 %  sodium chloride  infusion (has no administration in time range)  HYDROcodone -acetaminophen  (NORCO/VICODIN) 5-325 MG per tablet 1-2 tablet (has no administration in time range)  HYDROmorphone  (DILAUDID ) injection 0.5-1 mg (has no administration in time range)  ondansetron  (ZOFRAN ) tablet 4 mg (has no administration in time range)    Or   ondansetron  (ZOFRAN ) injection 4 mg (has no administration in time range)  potassium chloride  10 mEq in 100 mL IVPB (has no administration in time range)  sodium chloride  0.9 % bolus 2,000 mL (0 mLs Intravenous Stopped 02/23/24 1337)  piperacillin -tazobactam (ZOSYN ) IVPB 3.375 g (0 g Intravenous Stopped 02/23/24 1159)  morphine  (PF) 4 MG/ML injection 4 mg (4 mg Intravenous Given 02/23/24 1110)  ondansetron  (ZOFRAN ) injection 4 mg (4 mg Intravenous Given 02/23/24 1110)  vancomycin  (VANCOREADY) IVPB 1750 mg/350 mL (1,750 mg Intravenous New Bag/Given 02/23/24 1230)  iohexol  (OMNIPAQUE ) 300 MG/ML solution 100 mL (100 mLs Intravenous Contrast Given 02/23/24 1348)     IMPRESSION / MDM / ASSESSMENT AND PLAN / ED COURSE  I reviewed the triage vital signs and the nursing notes.  Differential diagnosis includes, but is not limited to, sepsis, abdominal abscess, diverticulitis, SBO, dehydration, AKI  {Patient presents with symptoms of an acute illness or injury that is potentially life-threatening.  Patient presents with signs of sepsis, diverticulitis, AKI requiring medical admission.  Initially soft pressures but responsive to fluids.  No indications for pressors.  Tachycardic and tachypneic.  Leukopenic with ANC of 100, lactic acidosis.  Cultures are drawn, started on broad-spectrum antibiotics and IV fluids.  AKI.  CT, as above.  Consult medicine for admission.      FINAL CLINICAL IMPRESSION(S) / ED DIAGNOSES   Final diagnoses:  Severe sepsis (HCC)  Diverticulitis     Rx / DC Orders   ED Discharge Orders     None        Note:  This document was prepared using Dragon voice recognition software and may include unintentional dictation errors.   Claudene Rover, MD 02/23/24 [phone]

## 2024-02-23 NOTE — Telephone Encounter (Signed)
 Brief Nutrition Note:  Attempted to reach patient for nutrition follow-up. Son of patient answered phone. He reports patient not doing so hot this morning. States patient is having cold chills and having bloody bowel movements. Son questioning if this is related to chemo received 7/29 or diverticulitis flare as she was recently hospitalized due to this (7/6-7/15).   Son unsure of onset of symptoms. Says he is only aware of bloody stool occurrence this morning. Wife of son is currently with patient. RD recommended patient present to ED for further evaluation. Son is agreeable to this.   Will continue to monitor. RD to reschedule nutrition follow-up as appropriate.

## 2024-02-23 NOTE — Progress Notes (Signed)
 Son, [name], updated on pt's status and transfer

## 2024-02-23 NOTE — ED Triage Notes (Signed)
 Patient to ED via POV for abd pain. Having nausea and weakness for the past few days.  Had chemo on Tuesday for breast cancer. Hx diverticulitis.

## 2024-02-23 NOTE — Progress Notes (Signed)
 Patient transported to room 12A for a  high level of care.Son,Maurice updated on patient room and status.

## 2024-02-23 NOTE — Telephone Encounter (Signed)
 Rollator ordered on [employer] through [employer].

## 2024-02-23 NOTE — ED Notes (Signed)
Assisted patient onto bedpan. 

## 2024-02-23 NOTE — H&P (Signed)
 History and Physical    [name] FMW:[id number] DOB: 04-06-1946 DOA: 02/23/2024  PCP: Antonetta Rollene BRAVO, MD   Patient coming from: Home    Chief Complaint: Abdominal pain  HPI: BRYTON WAIGHT is a 78 y.o. female with medical history significant of breast cancer ,HLD, HTN who presented from home with complaint of abdominal pain.  She was recently discharged from here on 7/15 after she was admitted for acute diverticulitis and was managed with antibiotics.  She is currently on chemotherapy for breast cancer.  As per report, she had worsening of abdominal pain at home with bloody/mucousy stool.Abdominal pain started last Tuesday. On presentation, she was afebrile, in sinus tachycardia.  Blood pressure was soft on presentation but she responded to IV fluid. Initial lab work showed potassium of 3.2, creatinine of 1.26, WC count of 0.5, hemoglobin of 8.7, platelet of 76, lactate of 4. Chest x-ray did not show any acute findings.  CT Abdomen/Pelvis showed moderate inflammation surrounding the sigmoid, most consistent with diverticulitis.  Patient started on broad-spectrum antibiotics.  Antibiotic coverage broadened with vancomycin  because of presence of concurrent neutropenia. Patient seen and examined at the bedside in the emergency department.  During my evaluation, her blood pressure had not improved, actually her blood pressure was on the higher side.  She was in sinus tachycardia with heart rate in the range of 120s to 130s.  She was seen during and was having chills.  Her son was at bedside.  She complains of pain on her bilateral lower quadrants of the abdomen. No report of chest pain, shortness of breath, cough, dysuria, hematochezia or melena.  ED Course: Started on broad-spectrum antibiotics.  Blood culture sent.General surgery consulted  Review of Systems: As per HPI otherwise 10 point review of systems negative.    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   Depression    Diverticulosis of colon    GERD (gastroesophageal reflux disease)    Hyperlipidemia    Hypertension    Obesity    Osteoarthritis of knees, bilateral    Sleep apnea     Past Surgical History:  Procedure Laterality Date   ABDOMINAL HYSTERECTOMY     AXILLARY SENTINEL NODE BIOPSY Right 10/22/2023   Procedure: BIOPSY, LYMPH NODE, SENTINEL, AXILLARY;  Surgeon: Evonnie Dorothyann LABOR, DO;  Location: [facility];  Service: General;  Laterality: Right;   BREAST BIOPSY Right 09/09/2023   US  RT BREAST BX W LOC DEV 1ST LESION IMG BX SPEC US  GUIDE 09/09/2023 Lennon Nest, MD [facility]   BREAST LUMPECTOMY WITH RADIO FREQUENCY LOCALIZER Right 10/22/2023   Procedure: BREAST LUMPECTOMY WITH RADIO FREQUENCY LOCALIZER;  Surgeon: Evonnie Dorothyann LABOR, DO;  Location: [facility];  Service: General;  Laterality: Right;   COLONOSCOPY  07/23/2003   Dr. Keven Smith:numerous large scattered diverticula   COLONOSCOPY N/A 11/15/2013   Dr. Harvey: moderate diverticula, small internal hemorrhoids, redudant colon. Next TCS in 2025 with overtube.    ESOPHAGOGASTRODUODENOSCOPY (EGD) WITH ESOPHAGEAL DILATION N/A 11/15/2013   Dr. Harvey: stricture at GE junction s/p dilation. moderate erosive gastritis, negative H.pylori   MULTIPLE TOOTH EXTRACTIONS  2024   PORTACATH PLACEMENT Left 10/22/2023   Procedure: INSERTION, TUNNELED CENTRAL VENOUS DEVICE, WITH PORT;  Surgeon: Evonnie Dorothyann LABOR, DO;  Location: [facility];  Service: General;  Laterality: Left;   TOTAL KNEE ARTHROPLASTY  06/01/2004   left / Dr. Margrette   TOTAL KNEE  ARTHROPLASTY  11/29/2003   right / Dr. Margrette   TUBAL LIGATION       reports that she has never smoked. She has never been exposed to tobacco smoke. She has never used smokeless tobacco. She reports that she does not drink alcohol and does not use drugs.  No Known Allergies  Family History  Problem Relation Age of Onset   Diabetes  Mother    Hypertension Mother    Heart disease Mother    Aneurysm Father    Diabetes Father    Hypertension Brother    Diabetes Brother    Diabetes Brother    Diabetes Brother    Hypertension Brother    Breast cancer Maternal Aunt        dx >50   Cancer Maternal Aunt        unknown type; ? breast; dx >50?   Prostate cancer Maternal Uncle    Hypertension Son      Prior to Admission medications   Medication Sig Start Date End Date Taking? Authorizing Provider  albuterol  (VENTOLIN  HFA) 108 (90 Base) MCG/ACT inhaler Inhale 2 puffs into the lungs every 6 (six) hours as needed for wheezing or shortness of breath. 10/14/23   Antonetta Rollene BRAVO, MD  aspirin EC 81 MG tablet Take 81 mg by mouth daily. Swallow whole.    [provider]  atorvastatin  (LIPITOR) 20 MG tablet Take 1 tablet (20 mg total) by mouth daily. 05/29/23   Antonetta Rollene BRAVO, MD  BIOTIN PO Take 1 tablet by mouth daily.    [provider]  bisacodyl  5 MG EC tablet Take one tablet by mouth every 3 days as needed, for constipation. Repeat once after 6 hours if still no bowel movement 01/15/24   Antonetta Rollene BRAVO, MD  Cholecalciferol (VITAMIN D3) 125 MCG (5000 UT) CAPS Take 1 capsule (5,000 Units total) by mouth daily. 05/29/23   Antonetta Rollene BRAVO, MD  clotrimazole -betamethasone  (LOTRISONE ) cream Apply cream twice daily to rash in groin for 10 days, then as needed 05/29/23   Antonetta Rollene BRAVO, MD  cycloPHOSphamide  (CYTOXAN  IJ) Inject as directed.    [provider]  DOCEtaxel  (TAXOTERE  IV) Inject into the vein.    [provider]  docusate sodium  (COLACE) 100 MG capsule Take 1 capsule (100 mg total) by mouth 2 (two) times daily. 10/22/23 10/21/24  Pappayliou, Dorothyann A, DO  famotidine (PEPCID) 10 MG tablet Take 10 mg by mouth 2 (two) times daily.    [provider]  furosemide  (LASIX ) 40 MG tablet Take 40 mg by mouth daily as needed. 02/11/24   [provider]  gabapentin   (NEURONTIN ) 100 MG capsule Take 1 capsule (100 mg total) by mouth at bedtime. 07/01/23   Antonetta Rollene BRAVO, MD  Hydrocortisone (GERHARDT'S BUTT CREAM) CREA Apply 1 application topically 3 (three) times daily. 03/30/19   Moishe Chiquita HERO, NP  lidocaine -prilocaine  (EMLA ) cream Apply to affected area once 11/24/23   Rogers Hai, MD  losartan  (COZAAR ) 100 MG tablet Take 1 tablet (100 mg total) by mouth daily. 02/04/24 03/05/24  Dezii, Alexandra, DO  losartan -hydrochlorothiazide  (HYZAAR) 100-25 MG tablet Take 1 tablet by mouth daily. 02/11/24   [provider]  magnesium  30 MG tablet Take one tablet by mouth once daily 05/29/23   Antonetta Rollene BRAVO, MD  megestrol  (MEGACE ) 40 MG/ML suspension Take 10 mLs (400 mg total) by mouth 2 (two) times daily. 02/11/24   Katragadda, Sreedhar, MD  Melatonin 3 MG CAPS Take 1  capsule (3 mg total) by mouth at bedtime. 05/29/23   Antonetta Rollene BRAVO, MD  metoprolol  succinate (TOPROL -XL) 50 MG 24 hr tablet TAKE 1 TABLET BY MOUTH  DAILY WITH OR IMMEDIATELY  FOLLOWING A MEAL 05/29/23   Antonetta Rollene BRAVO, MD  olopatadine  (PATANOL) 0.1 % ophthalmic solution Place 1 drop into both eyes 2 (two) times daily. 05/29/23   Antonetta Rollene BRAVO, MD  omeprazole  (PRILOSEC) 20 MG capsule Take 1 capsule (20 mg total) by mouth daily. 05/29/23   Antonetta Rollene BRAVO, MD  ondansetron  (ZOFRAN ) 4 MG tablet Take 1 tablet (4 mg total) by mouth daily as needed for nausea or vomiting. 10/22/23 10/21/24  Pappayliou, Dorothyann A, DO  Palonosetron  HCl (ALOXI  IV) Inject into the vein.    [provider]  polyethylene glycol powder (GLYCOLAX /MIRALAX ) 17 GM/SCOOP powder Take 17 g by mouth 2 (two) times daily as needed. 01/15/24   Antonetta Rollene BRAVO, MD  potassium chloride  SA (KLOR-CON  M) 20 MEQ tablet Take 20 mEq by mouth daily. 02/11/24   [provider]  prochlorperazine  (COMPAZINE ) 10 MG tablet Take 1 tablet (10 mg total) by mouth every 6 (six) hours as needed for nausea or  vomiting. 11/24/23   Rogers Hai, MD  pyridOXINE  (VITAMIN B-6) 100 MG tablet Take 200 mg by mouth daily.    [provider]  sertraline  (ZOLOFT ) 50 MG tablet Take 1 tablet (50 mg total) by mouth daily. 08/20/23   Antonetta Rollene BRAVO, MD  temazepam  (RESTORIL ) 15 MG capsule Take 1 capsule (15 mg total) by mouth at bedtime as needed for sleep. 10/14/23   Antonetta Rollene BRAVO, MD    Physical Exam: Vitals:   02/23/24 1130 02/23/24 1230 02/23/24 1300 02/23/24 1330  BP: (!) 94/48 (!) 119/58 126/65 135/66  Pulse: 99  (!) 105 (!) 110  Resp: (!) 22 (!) 26 (!) 28 (!) 22  Temp:      TempSrc:      SpO2: 100%  100% 100%  Weight:      Height:        Constitutional: Overall comfortable, lying in bed Vitals:   02/23/24 1130 02/23/24 1230 02/23/24 1300 02/23/24 1330  BP: (!) 94/48 (!) 119/58 126/65 135/66  Pulse: 99  (!) 105 (!) 110  Resp: (!) 22 (!) 26 (!) 28 (!) 22  Temp:      TempSrc:      SpO2: 100%  100% 100%  Weight:      Height:       Eyes: PERRL, lids and conjunctivae normal ENMT: Mucous membranes are moist.  Neck: normal, supple, no masses, no thyromegaly Respiratory: clear to auscultation bilaterally, no wheezing, no crackles. Normal respiratory effort. No accessory muscle use.  Cardiovascular: Sinus tachycardia, chemo port on the chest  abdomen: No flank tenderness, bowel sounds present, soft and nondistended Musculoskeletal: no clubbing / cyanosis. No joint deformity upper and lower extremities.  Skin: no rashes, lesions, ulcers. No induration Neurologic: CN 2-12 grossly intact.  Strength 5/5 in all 4.  Psychiatric: Normal judgment and insight. Alert and oriented x 3. Normal mood.   Foley Catheter:None  Labs on Admission: I have personally reviewed following labs and imaging studies  CBC: Recent Labs  Lab 02/17/24 0938 02/23/24 1058  WBC 9.0 0.5*  NEUTROABS 5.6 0.1*  HGB 9.9* 8.7*  HCT 31.3* 27.3*  MCV 101.0* 100.4*  PLT 210 76*   Basic Metabolic  Panel: Recent Labs  Lab 02/17/24 0938 02/23/24 1058  NA 140 141  K  3.2* 3.2*  CL 108 106  CO2 22 20*  GLUCOSE 90 187*  BUN 8 15  CREATININE 0.62 1.26*  CALCIUM  8.7* 8.7*  MG 1.7  --    GFR: Estimated Creatinine Clearance: 35.3 mL/min (A) (by C-G formula based on SCr of 1.26 mg/dL (H)). Liver Function Tests: Recent Labs  Lab 02/17/24 0938 02/23/24 1058  AST 20 25  ALT 10 8  ALKPHOS 51 45  BILITOT 1.1 1.4*  PROT 6.3* 6.0*  ALBUMIN  3.0* 2.6*   No results for input(s): LIPASE, AMYLASE in the last 168 hours. No results for input(s): AMMONIA in the last 168 hours. Coagulation Profile: Recent Labs  Lab 02/23/24 1058  INR 1.3*   Cardiac Enzymes: No results for input(s): CKTOTAL, CKMB, CKMBINDEX, TROPONINI in the last 168 hours. BNP (last 3 results) No results for input(s): PROBNP in the last 8760 hours. HbA1C: No results for input(s): HGBA1C in the last 72 hours. CBG: No results for input(s): GLUCAP in the last 168 hours. Lipid Profile: No results for input(s): CHOL, HDL, LDLCALC, TRIG, CHOLHDL, LDLDIRECT in the last 72 hours. Thyroid  Function Tests: No results for input(s): TSH, T4TOTAL, FREET4, T3FREE, THYROIDAB in the last 72 hours. Anemia Panel: No results for input(s): VITAMINB12, FOLATE, FERRITIN, TIBC, IRON , RETICCTPCT in the last 72 hours. Urine analysis:    Component Value Date/Time   COLORURINE YELLOW (A) 01/25/2024 1744   APPEARANCEUR CLEAR (A) 01/25/2024 1744   APPEARANCEUR Clear 10/14/2023 1705   LABSPEC 1.015 01/25/2024 1744   PHURINE 6.0 01/25/2024 1744   GLUCOSEU NEGATIVE 01/25/2024 1744   HGBUR NEGATIVE 01/25/2024 1744   HGBUR trace-intact 11/07/2009 1057   BILIRUBINUR NEGATIVE 01/25/2024 1744   BILIRUBINUR Negative 10/14/2023 1705   KETONESUR NEGATIVE 01/25/2024 1744   PROTEINUR NEGATIVE 01/25/2024 1744   UROBILINOGEN 2.0 10/25/2014 1142   UROBILINOGEN 1.0 11/07/2009 1057   NITRITE  NEGATIVE 01/25/2024 1744   LEUKOCYTESUR NEGATIVE 01/25/2024 1744    Radiological Exams on Admission: CT ABDOMEN PELVIS W CONTRAST Result Date: 02/23/2024 EXAM: CT ABDOMEN AND PELVIS WITH CONTRAST 02/23/2024 01:57:47 PM TECHNIQUE: CT of the abdomen and pelvis was performed with the administration of intravenous contrast. Multiplanar reformatted images are provided for review. Automated exposure control, iterative reconstruction, and/or weight based adjustment of the mA/kV was utilized to reduce the radiation dose to as low as reasonably achievable. COMPARISON: 01/30/2024. CLINICAL HISTORY: Eval divertic and complications. Hx recent divertic, worsening bloody/mucous stool, RLQ pain. Patient to ED via POV for abd pain. Having nausea and weakness for the past few days. Had chemo on Tuesday for breast cancer. Hx diverticulitis. FINDINGS: LOWER CHEST: Motion degradation at the lung bases. 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 GALLBLADDER AND BILE DUCTS: Gallbladder is unremarkable. No biliary ductal dilatation. SPLEEN: No acute abnormality. PANCREAS: No acute abnormality. ADRENAL GLANDS: No acute abnormality. KIDNEYS, URETERS AND BLADDER: No hydronephrosis. No perinephric or periureteral stranding. Urinary bladder is unremarkable. GI AND BOWEL: Tiny hiatal hernia. Moderate inflammation surrounding the sigmoid. pericolic fluid and gas, including at up to 3.5 cm on 66/2. Prominent pericolonic gas adjacent to more proximal sigmoid including on 63/2 . PERITONEUM AND RETROPERITONEUM: No ascites. No free air. VASCULATURE: Advanced abdominal aortic atherosclerosis. LYMPH NODES: No lymphadenopathy. REPRODUCTIVE ORGANS: Hysterectomy. BONES AND SOFT TISSUES: Lumbosacral spondylosis. Abdominal pelvic degradation secondary to patient body habitus and minimal motion. IMPRESSION:  1.  Moderate inflammation surrounding the sigmoid, most consistent with diverticulitis. Foci of proximal and distal pericolonic fluid and gas may represent inflamed diverticula or early developing collections. Depending on clinical symptomatology, consider CT follow-up at 3 to 5 days. 2. Mild degradation as detailed above. Electronically signed by: Rockey Kilts MD 02/23/2024 02:23 PM EDT RP Workstation: [id number]   DG Chest Portable 1 View Result Date: 02/23/2024 CLINICAL DATA:  sepsis screen, breast CA patient, likely acute diverticulitis EXAM: PORTABLE CHEST - 1 VIEW COMPARISON:  January 31, 2024 FINDINGS: Left chest port in place terminating at the brachiocephalic vein confluence, unchanged. Elevation of the right hemidiaphragm. No focal airspace consolidation, pleural effusion, or pneumothorax. Mild cardiomegaly. Tortuous aorta with aortic atherosclerosis. No acute fracture or destructive lesions. Multilevel thoracic osteophytosis. IMPRESSION: Mild cardiomegaly.  No acute cardiopulmonary abnormality. Electronically Signed   By: Rogelia Myers M.D.   On: 02/23/2024 11:47     Assessment/Plan Principal Problem:   Acute diverticulitis Active Problems:   Hyperlipidemia LDL goal <100   Essential hypertension   Severe sepsis (HCC)  Severe sepsis secondary to diverticulitis: Presented with tachycardia, hypotension, lactic acidosis, neutropenia.  Continue vancomycin  and Zosyn  for now on the background of severe neutropenia.  Follow-up blood cultures.  Continue gentle IV fluids.  Acute sigmoid diverticulitis: She was recently managed and was discharged on 7/15 for the same.  Patient was discharged on oral antibiotic. Presented with abdominal pain.  CT abdomen/pelvis as above.  Continue n.p.o. for now.  Continue current antibiotics.  Continue antiemetics, IV fluids and pain management.  Due to presentation with recurrent problem, will also consult general surgery.  History of breast cancer/severe  neutropenia: Currently on chemotherapy.  Follows with oncology.  Lab work shows severe neutropenia.  Will give her a dose of Granix   AKI: Baseline creatinine normal.  Continue fluids  History of hypertension: Hypotensive on presentation.  Antihypertensives on hold  Hyperlipidemia: Takes statin at home  Hypokalemia: Supplemented with potassium  Normocytic anemia: This is most likely secondary to her malignancy, chemotherapy.  Continue monitor hemoglobin.         Severity of Illness: The appropriate patient status for this patient is INPATIENT  DVT prophylaxis: SCD Code Status: Full code Family Communication: Son at bedside Consults called: General surgery     Ivonne Mustache MD [facility]  02/23/2024, 2:56 PM

## 2024-02-24 ENCOUNTER — Telehealth: Payer: Self-pay

## 2024-02-24 ENCOUNTER — Inpatient Hospital Stay

## 2024-02-24 ENCOUNTER — Inpatient Hospital Stay: Admitting: Anesthesiology

## 2024-02-24 ENCOUNTER — Other Ambulatory Visit: Payer: Self-pay

## 2024-02-24 ENCOUNTER — Encounter: Payer: Self-pay | Admitting: Internal Medicine

## 2024-02-24 ENCOUNTER — Telehealth: Payer: Self-pay | Admitting: *Deleted

## 2024-02-24 ENCOUNTER — Encounter
Admission: EM | Disposition: A | Discharge status: Skilled Nursing Facility | Payer: Self-pay | Source: Home / Self Care | Attending: Internal Medicine

## 2024-02-24 DIAGNOSIS — K5720 Diverticulitis of large intestine with perforation and abscess without bleeding: Secondary | ICD-10-CM | POA: Diagnosis not present

## 2024-02-24 DIAGNOSIS — A419 Sepsis, unspecified organism: Secondary | ICD-10-CM | POA: Diagnosis not present

## 2024-02-24 DIAGNOSIS — K388 Other specified diseases of appendix: Secondary | ICD-10-CM

## 2024-02-24 DIAGNOSIS — K5792 Diverticulitis of intestine, part unspecified, without perforation or abscess without bleeding: Secondary | ICD-10-CM | POA: Diagnosis not present

## 2024-02-24 DIAGNOSIS — R6520 Severe sepsis without septic shock: Secondary | ICD-10-CM | POA: Diagnosis not present

## 2024-02-24 HISTORY — PX: COLECTOMY WITH COLOSTOMY CREATION/HARTMANN PROCEDURE: SHX6598

## 2024-02-24 LAB — CBC
HCT: 24.2 % — ABNORMAL LOW (ref 36.0–46.0)
HCT: 27.4 % — ABNORMAL LOW (ref 36.0–46.0)
Hemoglobin: 7.7 g/dL — ABNORMAL LOW (ref 12.0–15.0)
Hemoglobin: 9 g/dL — ABNORMAL LOW (ref 12.0–15.0)
MCH: 32.2 pg (ref 26.0–34.0)
MCH: 31.8 pg (ref 26.0–34.0)
MCHC: 31.8 g/dL (ref 30.0–36.0)
MCHC: 32.8 g/dL (ref 30.0–36.0)
MCV: 101.3 fL — ABNORMAL HIGH (ref 80.0–100.0)
MCV: 96.8 fL (ref 80.0–100.0)
Platelets: 78 K/uL — ABNORMAL LOW (ref 150–400)
Platelets: 99 K/uL — ABNORMAL LOW (ref 150–400)
RBC: 2.39 MIL/uL — ABNORMAL LOW (ref 3.87–5.11)
RBC: 2.83 MIL/uL — ABNORMAL LOW (ref 3.87–5.11)
RDW: 16.2 % — ABNORMAL HIGH (ref 11.5–15.5)
RDW: 16.7 % — ABNORMAL HIGH (ref 11.5–15.5)
WBC: 1 K/uL — CL (ref 4.0–10.5)
WBC: 1.1 K/uL — CL (ref 4.0–10.5)
nRBC: 0 % (ref 0.0–0.2)
nRBC: 0 % (ref 0.0–0.2)

## 2024-02-24 LAB — BASIC METABOLIC PANEL WITH GFR
Anion gap: 8 (ref 5–15)
BUN: 14 mg/dL (ref 8–23)
CO2: 20 mmol/L — ABNORMAL LOW (ref 22–32)
Calcium: 7.5 mg/dL — ABNORMAL LOW (ref 8.9–10.3)
Chloride: 113 mmol/L — ABNORMAL HIGH (ref 98–111)
Creatinine, Ser: 0.81 mg/dL (ref 0.44–1.00)
GFR, Estimated: >60 mL/min (ref >60)
Glucose, Bld: 114 mg/dL — ABNORMAL HIGH (ref 70–99)
Potassium: 4.2 mmol/L (ref 3.5–5.1)
Sodium: 141 mmol/L (ref 135–145)

## 2024-02-24 LAB — GLUCOSE, CAPILLARY
Glucose-Capillary: 108 mg/dL — ABNORMAL HIGH (ref 70–99)
Glucose-Capillary: 88 mg/dL (ref 70–99)
Glucose-Capillary: 131 mg/dL — ABNORMAL HIGH (ref 70–99)
Glucose-Capillary: 106 mg/dL — ABNORMAL HIGH (ref 70–99)
Glucose-Capillary: 163 mg/dL — ABNORMAL HIGH (ref 70–99)
Glucose-Capillary: 155 mg/dL — ABNORMAL HIGH (ref 70–99)

## 2024-02-24 LAB — PREPARE RBC (CROSSMATCH)

## 2024-02-24 LAB — LACTIC ACID, PLASMA
Lactic Acid, Venous: 2 mmol/L (ref 0.5–1.9)
Lactic Acid, Venous: 2.4 mmol/L (ref 0.5–1.9)

## 2024-02-24 LAB — URINE CULTURE: Culture: NO GROWTH

## 2024-02-24 LAB — ABO/RH: ABO/RH(D): O POS

## 2024-02-24 SURGERY — COLECTOMY, WITH COLOSTOMY CREATION
Anesthesia: General

## 2024-02-24 MED ORDER — KCL IN DEXTROSE-NACL 20-5-0.9 MEQ/L-%-% IV SOLN: INTRAVENOUS | Status: DC

## 2024-02-24 MED ORDER — PROPOFOL 10 MG/ML IV BOLUS
INTRAVENOUS | Status: DC | PRN
  Administered 2024-02-24: 100 ug/kg/min via INTRAVENOUS
  Administered 2024-02-24: 60 ug/kg/min via INTRAVENOUS

## 2024-02-24 MED ORDER — DEXAMETHASONE SODIUM PHOSPHATE 10 MG/ML IJ SOLN
INTRAMUSCULAR | Status: AC
  Filled 2024-02-24: qty 1

## 2024-02-24 MED ORDER — PHENYLEPHRINE HCL-NACL 20-0.9 MG/250ML-% IV SOLN
INTRAVENOUS | Status: DC | PRN
  Administered 2024-02-24: 40 ug/min via INTRAVENOUS

## 2024-02-24 MED ORDER — BUPIVACAINE LIPOSOME 1.3 % IJ SUSP
INTRAMUSCULAR | Status: AC
Start: 2024-02-24 — End: 2024-02-24
  Filled 2024-02-24: qty 20

## 2024-02-24 MED ORDER — LIDOCAINE HCL (CARDIAC) PF 100 MG/5ML IV SOSY
PREFILLED_SYRINGE | INTRAVENOUS | Status: DC | PRN
  Administered 2024-02-24: 100 mg via INTRAVENOUS

## 2024-02-24 MED ORDER — PHENYLEPHRINE HCL-NACL 20-0.9 MG/250ML-% IV SOLN
INTRAVENOUS | Status: AC
  Filled 2024-02-24: qty 250

## 2024-02-24 MED ORDER — FENTANYL CITRATE (PF) 100 MCG/2ML IJ SOLN
INTRAMUSCULAR | Status: DC | PRN
  Administered 2024-02-24 (×2): 50 ug via INTRAVENOUS

## 2024-02-24 MED ORDER — MIDAZOLAM HCL 2 MG/2ML IJ SOLN
INTRAMUSCULAR | Status: DC | PRN
  Administered 2024-02-24: 2 mg via INTRAVENOUS

## 2024-02-24 MED ORDER — ROCURONIUM BROMIDE 100 MG/10ML IV SOLN
INTRAVENOUS | Status: DC | PRN
  Administered 2024-02-24 (×2): 50 mg via INTRAVENOUS

## 2024-02-24 MED ORDER — HYDROMORPHONE HCL 1 MG/ML IJ SOLN
INTRAMUSCULAR | Status: AC
  Filled 2024-02-24: qty 1

## 2024-02-24 MED ORDER — SODIUM CHLORIDE (PF) 0.9 % IJ SOLN
INTRAMUSCULAR | Status: AC
  Filled 2024-02-24: qty 50

## 2024-02-24 MED ORDER — PHENYLEPHRINE 80 MCG/ML (10ML) SYRINGE FOR IV PUSH (FOR BLOOD PRESSURE SUPPORT)
PREFILLED_SYRINGE | INTRAVENOUS | Status: DC | PRN
  Administered 2024-02-24: 80 ug via INTRAVENOUS
  Administered 2024-02-24 (×2): 160 ug via INTRAVENOUS
  Administered 2024-02-24: 80 ug via INTRAVENOUS
  Administered 2024-02-24 (×4): 160 ug via INTRAVENOUS

## 2024-02-24 MED ORDER — MIDAZOLAM HCL 2 MG/2ML IJ SOLN
INTRAMUSCULAR | Status: AC
  Filled 2024-02-24: qty 2

## 2024-02-24 MED ORDER — SODIUM CHLORIDE 0.9 % IV SOLN: INTRAVENOUS | Status: DC | PRN

## 2024-02-24 MED ORDER — BUPIVACAINE-EPINEPHRINE (PF) 0.25% -1:200000 IJ SOLN
INTRAMUSCULAR | Status: AC
  Filled 2024-02-24: qty 30

## 2024-02-24 MED ORDER — PHENYLEPHRINE 80 MCG/ML (10ML) SYRINGE FOR IV PUSH (FOR BLOOD PRESSURE SUPPORT)
PREFILLED_SYRINGE | INTRAVENOUS | Status: AC
Start: 2024-02-24 — End: 2024-02-24
  Filled 2024-02-24: qty 10

## 2024-02-24 MED ORDER — ACETAMINOPHEN 500 MG PO TABS
1000.0000 mg | ORAL_TABLET | Freq: Four times a day (QID) | ORAL | Status: DC
  Administered 2024-02-24 – 2024-03-01 (×27): 1000 mg via ORAL
  Filled 2024-02-24 (×23): qty 2

## 2024-02-24 MED ORDER — DROPERIDOL 2.5 MG/ML IJ SOLN: 0.6250 mg | Freq: Once | INTRAMUSCULAR | Status: DC | PRN

## 2024-02-24 MED ORDER — FENTANYL CITRATE (PF) 100 MCG/2ML IJ SOLN
INTRAMUSCULAR | Status: AC
Start: 2024-02-24 — End: 2024-02-24
  Filled 2024-02-24: qty 2

## 2024-02-24 MED ORDER — PROPOFOL 10 MG/ML IV BOLUS
INTRAVENOUS | Status: AC
  Filled 2024-02-24: qty 20

## 2024-02-24 MED ORDER — ALBUMIN HUMAN 5 % IV SOLN
INTRAVENOUS | Status: AC
Start: 2024-02-24 — End: 2024-02-24
  Filled 2024-02-24: qty 250

## 2024-02-24 MED ORDER — SODIUM CHLORIDE 0.9 % IV SOLN
INTRAVENOUS | Status: AC
  Filled 2024-02-24: qty 2

## 2024-02-24 MED ORDER — HYDROMORPHONE HCL 1 MG/ML IJ SOLN
INTRAMUSCULAR | Status: DC | PRN
  Administered 2024-02-24: .5 mg via INTRAVENOUS
  Administered 2024-02-24 (×2): .25 mg via INTRAVENOUS

## 2024-02-24 MED ORDER — MORPHINE SULFATE (PF) 2 MG/ML IV SOLN
1.0000 mg | Freq: Once | INTRAVENOUS | Status: AC
  Administered 2024-02-24: 1 mg via INTRAVENOUS
  Filled 2024-02-24: qty 1

## 2024-02-24 MED ORDER — KETOROLAC TROMETHAMINE 30 MG/ML IJ SOLN
INTRAMUSCULAR | Status: DC | PRN
  Administered 2024-02-24: 30 mg via INTRAVENOUS

## 2024-02-24 MED ORDER — OXYCODONE HCL 5 MG PO TABS
5.0000 mg | ORAL_TABLET | ORAL | Status: DC | PRN
  Administered 2024-02-25 – 2024-03-02 (×10): 5 mg via ORAL
  Filled 2024-02-24 (×8): qty 1

## 2024-02-24 MED ORDER — NOREPINEPHRINE 4 MG/250ML-% IV SOLN
INTRAVENOUS | Status: AC
Start: 2024-02-24 — End: 2024-02-24
  Filled 2024-02-24: qty 250

## 2024-02-24 MED ORDER — METOPROLOL TARTRATE 25 MG PO TABS: 12.5000 mg | ORAL_TABLET | Freq: Once | ORAL | Status: DC

## 2024-02-24 MED ORDER — LIDOCAINE HCL (PF) 2 % IJ SOLN
INTRAMUSCULAR | Status: AC
Start: 2024-02-24 — End: 2024-02-24
  Filled 2024-02-24: qty 5

## 2024-02-24 MED ORDER — SODIUM CHLORIDE (PF) 0.9 % IJ SOLN
INTRAMUSCULAR | Status: DC | PRN
  Administered 2024-02-24: 100 mL via INTRAMUSCULAR

## 2024-02-24 MED ORDER — DEXMEDETOMIDINE HCL IN NACL 80 MCG/20ML IV SOLN
INTRAVENOUS | Status: AC
  Filled 2024-02-24: qty 20

## 2024-02-24 MED ORDER — ROCURONIUM BROMIDE 10 MG/ML (PF) SYRINGE
PREFILLED_SYRINGE | INTRAVENOUS | Status: AC
  Filled 2024-02-24: qty 10

## 2024-02-24 MED ORDER — FENTANYL CITRATE (PF) 100 MCG/2ML IJ SOLN: 25.0000 ug | INTRAMUSCULAR | Status: DC | PRN

## 2024-02-24 MED ORDER — SODIUM CHLORIDE 0.9 % IV SOLN
2.0000 g | Freq: Once | INTRAVENOUS | Status: AC
  Administered 2024-02-24: 2 g via INTRAVENOUS
  Filled 2024-02-24: qty 2

## 2024-02-24 MED ORDER — SODIUM CHLORIDE 0.9% IV SOLUTION: Freq: Once | INTRAVENOUS | Status: DC

## 2024-02-24 MED ORDER — ACETAMINOPHEN 10 MG/ML IV SOLN
INTRAVENOUS | Status: DC | PRN
Start: 2024-02-24 — End: 2024-02-24
  Administered 2024-02-24: 1000 mg via INTRAVENOUS

## 2024-02-24 MED ORDER — DEXMEDETOMIDINE HCL IN NACL 80 MCG/20ML IV SOLN
INTRAVENOUS | Status: DC | PRN
  Administered 2024-02-24: 20 ug via INTRAVENOUS

## 2024-02-24 MED ORDER — ONDANSETRON HCL 4 MG/2ML IJ SOLN
INTRAMUSCULAR | Status: DC | PRN
  Administered 2024-02-24: 4 mg via INTRAVENOUS

## 2024-02-24 MED ORDER — SUGAMMADEX SODIUM 200 MG/2ML IV SOLN
INTRAVENOUS | Status: DC | PRN
  Administered 2024-02-24: 200 mg via INTRAVENOUS

## 2024-02-24 MED ORDER — DEXTROSE-SODIUM CHLORIDE 5-0.9 % IV SOLN: INTRAVENOUS | Status: AC

## 2024-02-24 MED ORDER — DEXAMETHASONE SODIUM PHOSPHATE 10 MG/ML IJ SOLN
INTRAMUSCULAR | Status: DC | PRN
  Administered 2024-02-24: 10 mg via INTRAVENOUS

## 2024-02-24 MED ORDER — IPRATROPIUM-ALBUTEROL 0.5-2.5 (3) MG/3ML IN SOLN
3.0000 mL | Freq: Once | RESPIRATORY_TRACT | Status: AC
  Administered 2024-02-24: 3 mL via RESPIRATORY_TRACT
  Filled 2024-02-24: qty 3

## 2024-02-24 MED ORDER — ACETAMINOPHEN 10 MG/ML IV SOLN
INTRAVENOUS | Status: AC
  Filled 2024-02-24: qty 100

## 2024-02-24 MED ORDER — IRRISEPT - 450ML BOTTLE WITH 0.05% CHG IN STERILE WATER, USP 99.95% OPTIME
TOPICAL | Status: DC | PRN
  Administered 2024-02-24: 450 mL

## 2024-02-24 MED ORDER — PROPOFOL 1000 MG/100ML IV EMUL
INTRAVENOUS | Status: AC
  Filled 2024-02-24: qty 100

## 2024-02-24 MED ORDER — ONDANSETRON HCL 4 MG/2ML IJ SOLN
INTRAMUSCULAR | Status: AC
  Filled 2024-02-24: qty 2

## 2024-02-24 MED ORDER — ALBUMIN HUMAN 5 % IV SOLN: INTRAVENOUS | Status: DC | PRN

## 2024-02-24 SURGICAL SUPPLY — 53 items
ADHESIVE MASTISOL STRL (MISCELLANEOUS) IMPLANT
BARRIER ADH SEPRAFILM 3INX5IN (MISCELLANEOUS) IMPLANT
CHLORAPREP W/TINT 26 (MISCELLANEOUS) ×2 IMPLANT
DRAIN CHANNEL JP 19F RND 3/16 (MISCELLANEOUS) IMPLANT
DRAIN PENROSE 12X.25 LTX STRL (MISCELLANEOUS) IMPLANT
DRAPE LAPAROTOMY 100X77 ABD (DRAPES) ×2 IMPLANT
DRAPE WARM FLUID 44X44 (DRAPES) IMPLANT
DRSG OPSITE POSTOP 4X12 (GAUZE/BANDAGES/DRESSINGS) IMPLANT
DRSG OPSITE POSTOP 4X14 (GAUZE/BANDAGES/DRESSINGS) IMPLANT
DRSG TEGADERM 4X4.75 (GAUZE/BANDAGES/DRESSINGS) IMPLANT
ELECT BLADE 6.5 EXT (BLADE) IMPLANT
ELECT CAUTERY BLADE 6.4 (BLADE) ×2 IMPLANT
ELECTRODE EZSTD 165MM 6.5IN (MISCELLANEOUS) ×2 IMPLANT
ELECTRODE REM PT RTRN 9FT ADLT (ELECTROSURGICAL) ×2 IMPLANT
EVACUATOR SILICONE 100CC (DRAIN) IMPLANT
GAUZE 4X4 16PLY ~~LOC~~+RFID DBL (SPONGE) ×2 IMPLANT
GAUZE SPONGE 4X4 12PLY STRL (GAUZE/BANDAGES/DRESSINGS) ×2 IMPLANT
GLOVE BIO SURGEON STRL SZ7 (GLOVE) ×4 IMPLANT
GOWN STRL REUS W/ TWL LRG LVL3 (GOWN DISPOSABLE) ×8 IMPLANT
KIT OSTOMY 2 PC DRNBL 2.25 STR (WOUND CARE) IMPLANT
KIT TURNOVER KIT A (KITS) ×2 IMPLANT
LABEL OR SOLS (LABEL) ×2 IMPLANT
LAVAGE JET IRRISEPT WOUND (IRRIGATION / IRRIGATOR) IMPLANT
LIGASURE IMPACT 36 18CM CVD LR (INSTRUMENTS) ×2 IMPLANT
MANIFOLD NEPTUNE II (INSTRUMENTS) ×2 IMPLANT
NDL HYPO 22X1.5 SAFETY MO (MISCELLANEOUS) ×2 IMPLANT
NEEDLE HYPO 22X1.5 SAFETY MO (MISCELLANEOUS) ×1 IMPLANT
NS IRRIG 1000ML POUR BTL (IV SOLUTION) ×2 IMPLANT
PACK BASIN MAJOR ARMC (MISCELLANEOUS) ×2 IMPLANT
PACK COLON CLEAN CLOSURE (MISCELLANEOUS) ×2 IMPLANT
PASTE STOMA BARRIER 2OZ (OSTOMY) IMPLANT
PENCIL SMOKE EVACUATOR (MISCELLANEOUS) IMPLANT
RELOAD STAPLE 40 BLU REG (ENDOMECHANICALS) IMPLANT
RELOAD STAPLE 40 GRN THCK (ENDOMECHANICALS) IMPLANT
RELOAD STAPLE 75 3.8 BLU REG (ENDOMECHANICALS) IMPLANT
SPIKE FLUID TRANSFER (MISCELLANEOUS) ×2 IMPLANT
SPONGE DRAIN TRACH 4X4 STRL 2S (GAUZE/BANDAGES/DRESSINGS) IMPLANT
SPONGE T-LAP 18X18 ~~LOC~~+RFID (SPONGE) ×10 IMPLANT
STAPLER CVD CUT BLU 40 RELOAD (ENDOMECHANICALS) ×2 IMPLANT
STAPLER CVD CUT GRN 40 RELOAD (ENDOMECHANICALS) IMPLANT
STAPLER PROXIMATE 75MM BLUE (STAPLE) IMPLANT
STAPLER SKIN PROX 35W (STAPLE) ×2 IMPLANT
SUT PDS AB 0 CT1 27 (SUTURE) ×6 IMPLANT
SUT SILK 2 0SH CR/8 30 (SUTURE) ×2 IMPLANT
SUT SILK 2-0 18XBRD TIE 12 (SUTURE) ×2 IMPLANT
SUT VIC AB 3-0 SH 27X BRD (SUTURE) ×2 IMPLANT
SUTURE EHLN 3-0 FS-10 30 BLK (SUTURE) IMPLANT
SYR 20ML LL LF (SYRINGE) ×2 IMPLANT
SYR 30ML LL (SYRINGE) IMPLANT
TRAP FLUID SMOKE EVACUATOR (MISCELLANEOUS) ×2 IMPLANT
TRAY FOLEY MTR SLVR 16FR STAT (SET/KITS/TRAYS/PACK) ×2 IMPLANT
WATER STERILE IRR 1000ML POUR (IV SOLUTION) ×2 IMPLANT
WATER STERILE IRR 500ML POUR (IV SOLUTION) ×2 IMPLANT

## 2024-02-24 NOTE — Progress Notes (Signed)
 Pt back from OR. VSS stable at this time.

## 2024-02-24 NOTE — Progress Notes (Signed)
 Patient complains of shortness of breath, tachypneic,RR 28's-30's, tachycardic 110's-120's, 9/10 abd pain. Dr. Lenon notified. Per MD will order EKG, morphine  and duoneb. Will administer and continue to monitor.

## 2024-02-24 NOTE — Anesthesia Postprocedure Evaluation (Signed)
 Anesthesia Post Note  Patient: [name]  Procedure(s) Performed: COLECTOMY, WITH COLOSTOMY CREATION  Patient location during evaluation: PACU Anesthesia Type: General Level of consciousness: awake and alert Pain management: pain level controlled Vital Signs Assessment: post-procedure vital signs reviewed and stable Respiratory status: spontaneous breathing, nonlabored ventilation, respiratory function stable and patient connected to nasal cannula oxygen  Cardiovascular status: blood pressure returned to baseline and stable Postop Assessment: no apparent nausea or vomiting Anesthetic complications: no   There were no known notable events for this encounter.   Last Vitals:  Vitals:   02/24/24 1830 02/24/24 1900  BP: 137/79 (!) 150/81  Pulse: 84 95  Resp: 16 (!) 22  Temp:    SpO2: 100% 100%    Last Pain:  Vitals:   02/24/24 1800  TempSrc:   PainSc: Asleep                 Lynwood KANDICE Clause

## 2024-02-24 NOTE — Telephone Encounter (Signed)
 Noted

## 2024-02-24 NOTE — Anesthesia Procedure Notes (Signed)
 Procedure Name: Intubation Date/Time: 02/24/2024 10:14 AM  Performed by: Blair Suzen BRAVO, RNPre-anesthesia Checklist: Patient identified, Emergency Drugs available, Suction available and Patient being monitored Patient Re-evaluated:Patient Re-evaluated prior to induction Oxygen  Delivery Method: Circle system utilized Preoxygenation: Pre-oxygenation with 100% oxygen  Induction Type: IV induction Ventilation: Mask ventilation without difficulty Laryngoscope Size: McGrath and 3 Grade View: Grade I Tube type: Oral Number of attempts: 1 Airway Equipment and Method: Stylet and Oral airway Placement Confirmation: ETT inserted through vocal cords under direct vision, positive ETCO2 and breath sounds checked- equal and bilateral Secured at: 20 cm Tube secured with: Tape Dental Injury: Teeth and Oropharynx as per pre-operative assessment

## 2024-02-24 NOTE — Transfer of Care (Signed)
 Immediate Anesthesia Transfer of Care Note  Patient: [name]  Procedure(s) Performed: COLECTOMY, WITH COLOSTOMY CREATION  Patient Location: PACU  Anesthesia Type:General  Level of Consciousness: drowsy and patient cooperative  Airway & Oxygen  Therapy: Patient Spontanous Breathing  Post-op Assessment: Report given to RN and Post -op Vital signs reviewed and stable  Post vital signs: Reviewed and stable  Last Vitals:  Vitals Value Taken Time  BP 121/64 02/24/24 13:10  Temp 35.9 C 02/24/24 13:02  Pulse 83 02/24/24 13:13  Resp 23 02/24/24 13:13  SpO2 98 % 02/24/24 13:13  Vitals shown include unfiled device data.  Last Pain:  Vitals:   02/24/24 0900  TempSrc:   PainSc: 8       Patients Stated Pain Goal: 0 (02/24/24 0900)  Complications: There were no known notable events for this encounter.

## 2024-02-24 NOTE — Progress Notes (Signed)
 Pt off the floor to OR for emergent Ex Lap. Son at bedside.

## 2024-02-24 NOTE — Op Note (Signed)
 PROCEDURES: Open Hartmann's procedure Takedown of splenic flexure Repair ventral hernia 2 cms Appendectomy  Pre-operative Diagnosis: diverticulitis w abscess and Phlegmon with septic physiology  Post-operative Diagnosis: Same   Surgeon: Laneta FALCON Shynice Sigel   Assistants: Shelba Rakers RNFA   Anesthesia: General endotracheal anesthesia  ASA Class: 2  Surgeon: Laneta Luna , MD FACS  Anesthesia: Gen. with endotracheal tube  Findings: Acute contained perforated diverticulitis with abscess and Phlegmon, active infection at the time of surgery   Estimated Blood Loss: 150cc         Drains: 19 Fr pelvis         Specimens: sigmoid colon          Complications: none         Condition: stable  Procedure Details  The patient was seen again in the Holding Room. The benefits, complications, treatment options, and expected outcomes were discussed with the patient. The risks of bleeding, infection, recurrence of symptoms, failure to resolve symptoms,  bowel injury, any of which could require further surgery were reviewed with the patient.   The patient was taken to Operating Room, identified and the procedure verified.  A Time Out was held and the above information confirmed.  Prior to the induction of general anesthesia, antibiotic prophylaxis was administered ( pt on antibiotics preop and appropriate redosing performed per usual treatment dose). VTE prophylaxis was in place. General endotracheal anesthesia was then administered and tolerated well. After the induction, A foley was placed by RN,  The abdomen was prepped with Chloraprep and draped in the sterile fashion. The patient was positioned in the supine position. Midline  incision was created with 10 blade knife.  Electrocautery was used to dissect through subcutaneous tissue and the fascia was elevated between Kocher clamps and incised.  The abdominal cavity was entered under direct visualization .  No injuries were seen.    Extensive  adhesions encountered due to prior laparotomy, multiple nylon sutures had to be removed . Small bowel densely adhered to the abdominal wall. Extensive and time consuming enterolysis was performed in order to restore the anatomy. I spent at least 90 minutes of operative time with the dissection and enterolysis.  Initially there was evidence of seropurulent fluid within the abdominal cavity. Patient was placed in a steep Trendelenburg position and the sigmoid colon was identified.  There was dense sigmoid colon plastered to the pelvis and to the wall of the bladder.  The mesentery of the sigmoid colon and the rectum was hard as a rock. I Started by mobilizing the sigmoid and descending colon from a lateral to medial fashion after incising the white line of taut with electrocautery.  This allowed us  to have some mobilization.  We were able to identify the left ureter and the iliac vessels and preserve it at all times.  Because there was evidence of significant inflammatory response in the pelvis I was unable to create a good window went down on the pelvis.  At this time I decided to resect the descending colon right at the junction of the pelvic rim.  We used a standard contour stapler.  I then was able to use the vessel sealer to initially divide the healthy sigmoid mesentery towards the pelvis.  Please note that the presacral space was  inflamed and hard.  We were able to dissect the sigmoid and the rectum with some finger fracturing.  There was evidence of an abscess and there was evidence of chronic phlegmonous changes within the sigmoid  and upper rectum.  The abscess was drained and we obtained cultures from this area.  We were able to irrigate the area.   By finger fracturing were able to dissect the sigmoid from the bladder.  I divided the mesorectum and the mesentery of the sigmoid was very challenging but were able to safely divided using the vessel sealer and electrocautery.  We stayed close to the wall  of the sigmoid colon to avoid any vessel injuries.  We were able to also mobilize the rectum by incising the peritoneal reflection.  This allowed us  to have decent rectal margin. The rectum was divided with a green contour stapler.  Specimen passed off for final pathology.   Abdominal cavity was profusely irrigated and we will wince when back and mobilize the splenic flexure with electrocautery in the standard fashion. Defect to the left of the abdominal wall was created using a knife and electrocautery was used to form a cruciate incision in the fascia.  We went right through the rectus abdominal muscle.  We were able to exteriorize the descending colon stump.  There inspection of the abdomen revealed chronic inflammation and adhesions from the appendix to the pelvis, given this findings I decided to perform an appendectomy and the mesoappendix divided with the ligasure, Appendix divided with blue load stapler..    Second look showed no evidence of bowel injuries and an intact ureter with no evidence of active bleeding.  A 19 Blake drain was placed in the pelvis with an exit site in the right lower quadrant and secured with a 3-0 nylon suture. Proximal Marcaine  was used to perform bilateral T AP blocks in the standard fashion under direct visualization.  Multiple sheets of Seprafilm was used to prevent adhesions.  I change gown and gloves and close the abdominal cavity with running 0 PDS using the small bite technique in the standard fashion.  I incorporated a small ventral hernia defect taht was found within my closure. I placed a Penrose and closed incision over a Penrose with multiple staplers.  The dressing was placed and we matured the end colostomy in a standard rosebud fashion with multiple 3-0 Vicryl sutures.  Ostomy appliance was placed appropriately. Please note that this was a complex case due to the extensive adhesion and acute disease that required additional time to perform a safe surgery.  The  work required to complete this procedure  was significantly greater than what is typically expected. It required time-consuming dissection and  a substantially increased level of effort to complete a safe surgery.    Needle and laparotomy count were correct and there were no immediate complications.  Laneta Luna, MD, FACS

## 2024-02-24 NOTE — Anesthesia Preprocedure Evaluation (Signed)
 Anesthesia Evaluation  Patient identified by MRN, date of birth, ID band Patient awake    Reviewed: Allergy & Precautions, H&P , NPO status , Patient's Chart, lab work & pertinent test results, reviewed documented beta blocker date and time   History of Anesthesia Complications Negative for: history of anesthetic complications  Airway Mallampati: II  TM Distance: >3 FB Neck ROM: full    Dental  (+) Dental Advidsory Given, Poor Dentition, Edentulous Upper, Partial Lower, Missing   Pulmonary shortness of breath, asthma , sleep apnea and Oxygen  sleep apnea , neg COPD, neg recent URI   Pulmonary exam normal breath sounds clear to auscultation       Cardiovascular Exercise Tolerance: Good hypertension, (-) angina (-) Past MI and (-) Cardiac Stents Normal cardiovascular exam(-) dysrhythmias (-) Valvular Problems/Murmurs Rhythm:regular Rate:Normal  ECHO 08/2022: 1. Left ventricular ejection fraction, by estimation, is 70 to 75%. The left ventricle has hyperdynamic function. The left ventricle has no regional wall motion abnormalities. There is mild concentric left ventricular hypertrophy. Left ventricular diastolic parameters are consistent with Grade I diastolic dysfunction (impaired relaxation).   2. Right ventricular systolic function is low normal. The right ventricular size is normal. There is mildly elevated pulmonary artery systolic pressure. The estimated right ventricular systolic pressure is 41.9 mmHg.   3. Left atrial size was mildly dilated.   4. The mitral valve is degenerative. Trivial mitral valve regurgitation. Moderate mitral annular calcification.   5. The aortic valve is tricuspid. Aortic valve regurgitation is not visualized.   6. The inferior vena cava is normal in size with greater than 50% respiratory variability, suggesting right atrial pressure of 3 mmHg.     Neuro/Psych  PSYCHIATRIC DISORDERS Anxiety Depression     negative neurological ROS     GI/Hepatic Neg liver ROS,GERD  ,,  Endo/Other  negative endocrine ROS    Renal/GU negative Renal ROS  negative genitourinary   Musculoskeletal   Abdominal   Peds  Hematology negative hematology ROS (+)   Anesthesia Other Findings Past Medical History: No date: Asthma No date: Back pain 10/13/2023: BRCA2 gene mutation positive No date: Depression No date: Diverticulosis of colon No date: GERD (gastroesophageal reflux disease) No date: Hyperlipidemia No date: Hypertension No date: Obesity No date: Osteoarthritis of knees, bilateral No date: Sleep apnea   Reproductive/Obstetrics negative OB ROS                              Anesthesia Physical Anesthesia Plan  ASA: 3 and emergent  Anesthesia Plan: General   Post-op Pain Management:    Induction: Intravenous  PONV Risk Score and Plan: 3 and Ondansetron , Dexamethasone  and Treatment may vary due to age or medical condition  Airway Management Planned: Oral ETT  Additional Equipment:   Intra-op Plan:   Post-operative Plan: Extubation in OR  Informed Consent: I have reviewed the patients History and Physical, chart, labs and discussed the procedure including the risks, benefits and alternatives for the proposed anesthesia with the patient or authorized representative who has indicated his/her understanding and acceptance.     Dental Advisory Given  Plan Discussed with: Anesthesiologist, CRNA and Surgeon  Anesthesia Plan Comments:          Anesthesia Quick Evaluation

## 2024-02-24 NOTE — Progress Notes (Addendum)
 Ostomy site leaking at lower part of the dressing, under the bag. Dr. Jordis to bedside, ostomy is located at a skin fold. New ostomy dressing and bag applied, ostomy paste applied by MD. 2 piece device applied. Honeycomb dressing has small amount of drainage noted (see flowsheet) Dr. Jordis says it is to be expected. Patient responds to voice, denies pain at this time.   1410- ostomy site dry clean intact.

## 2024-02-24 NOTE — Progress Notes (Signed)
 CC: diverticulitis Subjective: Xfer to ICU persistent tachycardia, and worsening pain. Requiring multiple doses of IV morphine  Creat improving  Objective: Vital signs in last 24 hours: Temp:  [97.6 F (36.4 C)-99.7 F (37.6 C)] 99.1 F (37.3 C) (08/05 0400) Pulse Rate:  [99-139] 119 (08/05 0700) Resp:  [20-38] 32 (08/05 0700) BP: (80-155)/(37-80) 147/73 (08/05 0700) SpO2:  [96 %-100 %] 98 % (08/05 0700) Weight:  [79 kg-81.6 kg] 81.6 kg (08/05 0420) Last BM Date : 02/23/24  Intake/Output from previous day: 08/04 0701 - 08/05 0700 In: 5847.3 [I.V.:2112.8; IV Piggyback:3734.4] Out: -  Intake/Output this shift: No intake/output data recorded.  Physical exam:  C/o pain alert and competent Abd: now w rebound and worsening abdominal exam concerning for peritonitis   Lab Results: CBC  Recent Labs    02/23/24 1058 02/24/24 0416  WBC 0.5* 1.0*  HGB 8.7* 7.7*  HCT 27.3* 24.2*  PLT 76* 78*   BMET Recent Labs    02/23/24 1058 02/24/24 0416  NA 141 141  K 3.2* 4.2  CL 106 113*  CO2 20* 20*  GLUCOSE 187* 114*  BUN 15 14  CREATININE 1.26* 0.81  CALCIUM  8.7* 7.5*   PT/INR Recent Labs    02/23/24 1058  LABPROT 17.4*  INR 1.3*   ABG Recent Labs    02/23/24 2022  HCO3 17.6*    Studies/Results: CT ABDOMEN PELVIS W CONTRAST Result Date: 02/23/2024 EXAM: CT ABDOMEN AND PELVIS WITH CONTRAST 02/23/2024 01:57:47 PM TECHNIQUE: CT of the abdomen and pelvis was performed with the administration of intravenous contrast. Multiplanar reformatted images are provided for review. Automated exposure control, iterative reconstruction, and/or weight based adjustment of the mA/kV was utilized to reduce the radiation dose to as low as reasonably achievable. COMPARISON: 01/30/2024. CLINICAL HISTORY: Eval divertic and complications. Hx recent divertic, worsening bloody/mucous stool, RLQ pain. Patient to ED via POV for abd pain. Having nausea and weakness for the past few days. Had chemo  on Tuesday for breast cancer. Hx diverticulitis. FINDINGS: LOWER CHEST: Motion degradation at the lung bases. 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 GALLBLADDER AND BILE DUCTS: Gallbladder is unremarkable. No biliary ductal dilatation. SPLEEN: No acute abnormality. PANCREAS: No acute abnormality. ADRENAL GLANDS: No acute abnormality. KIDNEYS, URETERS AND BLADDER: No hydronephrosis. No perinephric or periureteral stranding. Urinary bladder is unremarkable. GI AND BOWEL: Tiny hiatal hernia. Moderate inflammation surrounding the sigmoid. pericolic fluid and gas, including at up to 3.5 cm on 66/2. Prominent pericolonic gas adjacent to more proximal sigmoid including on 63/2 . PERITONEUM AND RETROPERITONEUM: No ascites. No free air. VASCULATURE: Advanced abdominal aortic atherosclerosis. LYMPH NODES: No lymphadenopathy. REPRODUCTIVE ORGANS: Hysterectomy. BONES AND SOFT TISSUES: Lumbosacral spondylosis. Abdominal pelvic degradation secondary to patient body habitus and minimal motion. IMPRESSION: 1. Moderate inflammation surrounding the sigmoid, most consistent with diverticulitis. Foci of proximal and distal pericolonic fluid and gas may represent inflamed diverticula or early developing collections. Depending on clinical symptomatology, consider CT follow-up at 3 to 5 days. 2. Mild degradation as detailed above. Electronically signed by: Rockey Kilts MD 02/23/2024 02:23 PM EDT RP Workstation: [id number]   DG Chest Portable 1 View Result Date: 02/23/2024 CLINICAL DATA:  sepsis screen, breast CA patient, likely acute diverticulitis EXAM: PORTABLE CHEST - 1 VIEW COMPARISON:  January 31, 2024 FINDINGS: Left chest port in place terminating at the brachiocephalic vein confluence, unchanged.  Elevation of the right hemidiaphragm. No focal airspace consolidation, pleural effusion,  or pneumothorax. Mild cardiomegaly. Tortuous aorta with aortic atherosclerosis. No acute fracture or destructive lesions. Multilevel thoracic osteophytosis. IMPRESSION: Mild cardiomegaly.  No acute cardiopulmonary abnormality. Electronically Signed   By: Rogelia Myers M.D.   On: 02/23/2024 11:47    Anti-infectives: Anti-infectives (From admission, onward)    Start     Dose/Rate Route Frequency Ordered Stop   02/24/24 1200  vancomycin  (VANCOREADY) IVPB 750 mg/150 mL        750 mg 150 mL/hr over 60 Minutes Intravenous Every 24 hours 02/23/24 1548     02/23/24 2200  piperacillin -tazobactam (ZOSYN ) IVPB 3.375 g        3.375 g 12.5 mL/hr over 240 Minutes Intravenous Every 8 hours 02/23/24 1548     02/23/24 1230  vancomycin  (VANCOREADY) IVPB 1750 mg/350 mL        1,750 mg 175 mL/hr over 120 Minutes Intravenous  Once 02/23/24 1215 02/23/24 1457   02/23/24 1100  piperacillin -tazobactam (ZOSYN ) IVPB 3.375 g        3.375 g 100 mL/hr over 30 Minutes Intravenous  Once 02/23/24 1055 02/23/24 1159       Assessment/Plan: 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  She clearly has not improved and her physiology has worsened. I do think given all these circumstances the safest thing to do is to do Urgent laparotomy with colostomy . We will go ahead and transfuse 1 unit of PL and 1 unit of RBC given her pancytopenia. Procedure d/w pt and son in detail. Risks, benefits and possible complications including but not limited to infection, prolonged hospitalization, bowel injuries and even death. I think the safest thing at this time is to proceed w emergent surgery I personally spent a total of 50 minutes in the care of the patient today including performing a medically appropriate exam/evaluation, counseling and educating, placing orders, referring and communicating with other health care professionals, documenting clinical information in the EHR, independently interpreting  and reviewing images studies and coordinating care.     Laneta Luna, MD, [facility]  02/24/2024

## 2024-02-24 NOTE — Plan of Care (Signed)
  Problem: Health Behavior/Discharge Planning: Goal: Ability to manage health-related needs will improve Outcome: Not Progressing   Problem: Clinical Measurements: Goal: Ability to maintain clinical measurements within normal limits will improve Outcome: Not Progressing Goal: Will remain free from infection Outcome: Progressing Goal: Respiratory complications will improve Outcome: Not Progressing Goal: Cardiovascular complication will be avoided Outcome: Not Progressing   Problem: Activity: Goal: Risk for activity intolerance will decrease Outcome: Progressing   Problem: Nutrition: Goal: Adequate nutrition will be maintained Outcome: Not Progressing   Problem: Coping: Goal: Level of anxiety will decrease Outcome: Progressing   Problem: Elimination: Goal: Will not experience complications related to bowel motility Outcome: Not Progressing Goal: Will not experience complications related to urinary retention Outcome: Progressing   Problem: Pain Managment: Goal: General experience of comfort will improve and/or be controlled Outcome: Progressing   Problem: Fluid Volume: Goal: Hemodynamic stability will improve Outcome: Progressing   Problem: Respiratory: Goal: Ability to maintain adequate ventilation will improve Outcome: Progressing

## 2024-02-24 NOTE — Telephone Encounter (Signed)
 Copied from CRM [id number]. Topic: Clinical - Medical Advice >> Feb 23, 2024  4:27 PM Nathanel BROCKS wrote: Reason for [id number]. Pt was at the hospital.

## 2024-02-24 NOTE — Progress Notes (Addendum)
 PROGRESS NOTE [name]    DOB: October 29, 1945, 78 y.o.  FMW:[id number]    Code Status: Full Code   DOA: 02/23/2024   LOS: 1  Brief hospital course  [name] is a 78 y.o. female with medical history significant of breast cancer (currently undergoing treatment), HLD, HTN who presented from home with complaint of abdominal pain.  She was recently discharged from here on 7/15 after she was admitted for acute diverticulitis and was managed with antibiotics.   ED course: afebrile, in sinus tachycardia.  Blood pressure was soft on presentation but she responded to IV fluid. potassium of 3.2, creatinine of 1.26, WC count of 0.5, hemoglobin of 8.7, platelet of 76, lactate of 4. Chest x-ray did not show any acute findings.  CT Abdomen/Pelvis showed moderate inflammation surrounding the sigmoid, most consistent with diverticulitis.  Patient started on broad-spectrum antibiotics. Blood culture sent.General surgery consulted  Patient was admitted to medicine service for further workup and management of sepsis as outlined in detail below.  02/24/24 -feels improved on my exam after receiving breathing treatment and pain medication. Had worsening abdominal pain and tachypnea this am. General surgery planning to bring to OR for ex-lap and colostomy placement.   Assessment & Plan  Principal Problem:   Severe sepsis (HCC) Active Problems:   Hyperlipidemia LDL goal <100   Essential hypertension   Acute diverticulitis  Severe sepsis secondary to diverticulitis: general surgery following, appreciate your care. Repeat abdominal imaging this am is pending read. 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 No acute bleeding Macrocytic anemia- Hgb 8.7>7.7.  This is most likely secondary to her malignancy, chemotherapy.  - 1u pRBCs ordered today pre-op.  -  Continue monitor CBC    AKI: currently baseline   History of hypertension: Hypotensive on presentation which resolved with IV fluids.  Antihypertensives were held which may have caused reflexive tachycardia. She has been in sinus rhythm around 110-120 overnight. Denies flutter, chest pain - restart metoprolol   - continue telemetry    Hyperlipidemia: Takes statin at home   Hypokalemia: Supplemented with potassium   Body mass index is 35.13 kg/m.  VTE ppx: heparin  injection 5,000 Units Start: 02/23/24 2200 SCDs Start: 02/23/24 1449  Diet:     Diet   Diet NPO time specified Except for: Sips with Meds   Consultants: General surgery   Subjective 02/24/24    Pt reports feeling better after her morphine  and breathing treatment were given prior to my encounter but endorses significant abdominal pain prior to this. Denies CP, flutter   Objective  Blood pressure (!) 146/80, pulse (!) 118, temperature 99.1 F (37.3 C), temperature source Oral, resp. rate (!) 23, height 5' (1.524 m), weight 81.6 kg, SpO2 99%.  Intake/Output Summary (Last 24 hours) at 02/24/2024 0728 Last data filed at 02/24/2024 0545 Gross per 24 hour  Intake 5847.26 ml  Output --  Net 5847.26 ml   Filed Weights   02/23/24 1030 02/23/24 2018 02/24/24 0420  Weight: 79 kg 81 kg 81.6 kg    Physical Exam:  General: awake, alert, NAD HEENT: atraumatic, clear conjunctiva, anicteric sclera, MMM, hearing grossly normal Respiratory: normal respiratory effort. Cardiovascular: quick capillary refill, regular rhythm, tachycardic  Gastrointestinal: soft, diffuse tenderness worse in lower quadrants without rebound or guarding.  Nervous: A&O x3. no gross focal neurologic deficits, normal speech Extremities: moves all equally, no edema, normal tone Skin: dry, intact, normal temperature, normal color. No rashes, lesions or  ulcers on exposed skin Psychiatry: normal mood, congruent affect  Labs   I have personally reviewed the following labs and imaging studies CBC    Component Value Date/Time   WBC 1.0 (LL) 02/24/2024 0416   RBC 2.39 (L) 02/24/2024 0416   HGB 7.7 (L) 02/24/2024 0416   HGB 12.0 01/31/2023 1145   HCT 24.2 (L) 02/24/2024 0416   HCT 37.0 01/31/2023 1145   PLT 78 (L) 02/24/2024 0416   PLT 204 01/31/2023 1145   MCV 101.3 (H) 02/24/2024 0416   MCV 92 01/31/2023 1145   MCH 32.2 02/24/2024 0416   MCHC 31.8 02/24/2024 0416   RDW 16.2 (H) 02/24/2024 0416   RDW 12.6 01/31/2023 1145   LYMPHSABS 0.3 (L) 02/23/2024 1058   LYMPHSABS 5.4 (H) 12/12/2022 1033   MONOABS 0.1 02/23/2024 1058   EOSABS 0.0 02/23/2024 1058   EOSABS 0.2 12/12/2022 1033   BASOSABS 0.0 02/23/2024 1058   BASOSABS 0.1 12/12/2022 1033      Latest Ref Rng & Units 02/24/2024    4:16 AM 02/23/2024   10:58 AM 02/17/2024    9:38 AM  BMP  Glucose 70 - 99 mg/dL 885  812  90   BUN 8 - 23 mg/dL 14  15  8    Creatinine 0.44 - 1.00 mg/dL 9.18  8.73  9.37   Sodium 135 - 145 mmol/L 141  141  140   Potassium 3.5 - 5.1 mmol/L 4.2  3.2  3.2   Chloride 98 - 111 mmol/L 113  106  108   CO2 22 - 32 mmol/L 20  20  22    Calcium  8.9 - 10.3 mg/dL 7.5  8.7  8.7     CT ABDOMEN PELVIS W CONTRAST Result Date: 02/23/2024 EXAM: CT ABDOMEN AND PELVIS WITH CONTRAST 02/23/2024 01:57:47 PM TECHNIQUE: CT of the abdomen and pelvis was performed with the administration of intravenous contrast. Multiplanar reformatted images are provided for review. Automated exposure control, iterative reconstruction, and/or weight based adjustment of the mA/kV was utilized to reduce the radiation dose to as low as reasonably achievable. COMPARISON: 01/30/2024. CLINICAL HISTORY: Eval divertic and complications. Hx recent divertic, worsening bloody/mucous stool, RLQ pain. Patient to ED via POV for abd pain. Having nausea and weakness for the past few days. Had chemo on Tuesday for  breast cancer. Hx diverticulitis. FINDINGS: LOWER CHEST: Motion degradation at the lung bases. 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 GALLBLADDER AND BILE DUCTS: Gallbladder is unremarkable. No biliary ductal dilatation. SPLEEN: No acute abnormality. PANCREAS: No acute abnormality. ADRENAL GLANDS: No acute abnormality. KIDNEYS, URETERS AND BLADDER: No hydronephrosis. No perinephric or periureteral stranding. Urinary bladder is unremarkable. GI AND BOWEL: Tiny hiatal hernia. Moderate inflammation surrounding the sigmoid. pericolic fluid and gas, including at up to 3.5 cm on 66/2. Prominent pericolonic gas adjacent to more proximal sigmoid including on 63/2 . PERITONEUM AND RETROPERITONEUM: No ascites. No free air. VASCULATURE: Advanced abdominal aortic atherosclerosis. LYMPH NODES: No lymphadenopathy. REPRODUCTIVE ORGANS: Hysterectomy. BONES AND SOFT TISSUES: Lumbosacral spondylosis. Abdominal pelvic degradation secondary to patient body habitus and minimal motion. IMPRESSION: 1. Moderate inflammation surrounding the sigmoid, most consistent with diverticulitis. Foci of proximal and distal pericolonic  fluid and gas may represent inflamed diverticula or early developing collections. Depending on clinical symptomatology, consider CT follow-up at 3 to 5 days. 2. Mild degradation as detailed above. Electronically signed by: Rockey Kilts MD 02/23/2024 02:23 PM EDT RP Workstation: [id number]   DG Chest Portable 1 View Result Date: 02/23/2024 CLINICAL DATA:  sepsis screen, breast CA patient, likely acute diverticulitis EXAM: PORTABLE CHEST - 1 VIEW COMPARISON:  January 31, 2024 FINDINGS: Left chest port in place terminating at the brachiocephalic vein confluence, unchanged. Elevation of the right hemidiaphragm. No focal airspace consolidation, pleural effusion, or pneumothorax.  Mild cardiomegaly. Tortuous aorta with aortic atherosclerosis. No acute fracture or destructive lesions. Multilevel thoracic osteophytosis. IMPRESSION: Mild cardiomegaly.  No acute cardiopulmonary abnormality. Electronically Signed   By: Rogelia Myers M.D.   On: 02/23/2024 11:47    Disposition Plan & Communication  Patient status: Inpatient  Admitted From: Home Planned disposition location: Skilled nursing facility Anticipated discharge date: TBD pending clinical course  Family Communication: none at bedside    Author: Marien LITTIE Piety, DO [facility] 02/24/2024, 7:28 AM   Available by Epic secure chat 7AM-7PM. If 7PM-7AM, please contact night-coverage.  [facility] contact information found on [email or web address].

## 2024-02-24 NOTE — Progress Notes (Signed)
 Initial Nutrition Assessment  DOCUMENTATION CODES:   Obesity unspecified  INTERVENTION:   RD will monitor for diet advancement for the need for nutrition support  Pt at high refeed risk  Daily weights   NUTRITION DIAGNOSIS:   Inadequate oral intake related to acute illness as evidenced by NPO status.  GOAL:   Patient will meet greater than or equal to 90% of their needs  MONITOR:   Diet advancement, Labs, Weight trends, Skin, I & O's  REASON FOR ASSESSMENT:   Rounds    ASSESSMENT:   78 y/o female with h/o breast cancer s/p lumpectomy on chemotherapy, anxiety, depression, pulmonary hypertension, GERD, OSA and recent admssion for diverticulitis who is admitted for recurrent complicated diverticulitis, sepsis and shock.  Pt is known to the nutrition department from a recent previous admission. RD unable to see patient today as pt in the OR at time of RD visit. Pt NPO with plans for colectomy with colostomy creation today. Per chart review, pt reported nausea and abdomen pain for several days pta. Pt has been having issues with diverticulitis since July. Pt also receiving chemotherapy. Pt has been drinking Ensure at home. RD will monitor for diet advancement vs the need for nutrition support. Pt is at high refeed risk. Per chart, pt appears fairly weight stable at baseline. RD will obtain exam at follow up.   Medications reviewed and include: heparin , insulin , NaCl @125ml /hr, zosyn , vancomycin    Labs reviewed: K 4.2 wnl Wbc- 1.0(L), Hgb 7.7(L), Hct 24.2(L) Cbgs- 88, 108 x 24 hrs  AIC 5.8(H)- 06/2023  NUTRITION - FOCUSED PHYSICAL EXAM: Unable to perform at this time   Diet Order:   Diet Order             Diet NPO time specified Except for: Sips with Meds  Diet effective now                  EDUCATION NEEDS:   Not appropriate for education at this time  Skin:  Skin Assessment: Reviewed RN Assessment (incision abdomen)  Last BM:  8/5- type 6  Height:   Ht  Readings from Last 1 Encounters:  02/24/24 5' (1.524 m)    Weight:   Wt Readings from Last 1 Encounters:  02/24/24 81.6 kg    Ideal Body Weight:  45.45 kg  BMI:  Body mass index is 35.13 kg/m.  Estimated Nutritional Needs:   Kcal:  1700-2000kcal/day  Protein:  85-100g/day  Fluid:  1.4-1.6L/day  Augustin Shams MS, RD, LDN If unable to be reached, please send secure chat to RD inpatient available from 8:00a-4:00p daily

## 2024-02-24 NOTE — Plan of Care (Deleted)
Vital signs reviewed, ICU needs resolved  Will sign off at this time. No further recommendations at this time.     Gusta Marksberry David Jakarius Flamenco, M.D.  [facility]  Medical Director ICU-[facility] [place] Medical Director [facility] Cardio[facility] Department   

## 2024-02-24 NOTE — Telephone Encounter (Signed)
 Son called saying that they need to get a new appointment for radiation because the patient is in the hospital and she is going to be having surgery for diverticulitis.

## 2024-02-24 NOTE — Plan of Care (Signed)
  Problem: Education: Goal: Knowledge of General Education information will improve Description Including pain rating scale, medication(s)/side effects and non-pharmacologic comfort measures Outcome: Progressing   Problem: Health Behavior/Discharge Planning: Goal: Ability to manage health-related needs will improve Outcome: Progressing   Problem: Clinical Measurements: Goal: Ability to maintain clinical measurements within normal limits will improve Outcome: Progressing Goal: Respiratory complications will improve Outcome: Progressing   Problem: Coping: Goal: Level of anxiety will decrease Outcome: Progressing   

## 2024-02-25 ENCOUNTER — Ambulatory Visit: Admitting: Radiation Oncology

## 2024-02-25 ENCOUNTER — Inpatient Hospital Stay

## 2024-02-25 ENCOUNTER — Encounter: Payer: Self-pay | Admitting: Surgery

## 2024-02-25 DIAGNOSIS — R6520 Severe sepsis without septic shock: Secondary | ICD-10-CM | POA: Diagnosis not present

## 2024-02-25 DIAGNOSIS — A419 Sepsis, unspecified organism: Secondary | ICD-10-CM | POA: Diagnosis not present

## 2024-02-25 LAB — PREPARE PLATELET PHERESIS: Unit division: 0

## 2024-02-25 LAB — TYPE AND SCREEN
ABO/RH(D): O POS
Antibody Screen: NEGATIVE
Unit division: 0

## 2024-02-25 LAB — GLUCOSE, CAPILLARY
Glucose-Capillary: 142 mg/dL — ABNORMAL HIGH (ref 70–99)
Glucose-Capillary: 133 mg/dL — ABNORMAL HIGH (ref 70–99)
Glucose-Capillary: 128 mg/dL — ABNORMAL HIGH (ref 70–99)
Glucose-Capillary: 170 mg/dL — ABNORMAL HIGH (ref 70–99)
Glucose-Capillary: 166 mg/dL — ABNORMAL HIGH (ref 70–99)

## 2024-02-25 LAB — CBC
HCT: 28 % — ABNORMAL LOW (ref 36.0–46.0)
Hemoglobin: 8.9 g/dL — ABNORMAL LOW (ref 12.0–15.0)
MCH: 31.2 pg (ref 26.0–34.0)
MCHC: 31.8 g/dL (ref 30.0–36.0)
MCV: 98.2 fL (ref 80.0–100.0)
Platelets: 117 K/uL — ABNORMAL LOW (ref 150–400)
RBC: 2.85 MIL/uL — ABNORMAL LOW (ref 3.87–5.11)
RDW: 16.7 % — ABNORMAL HIGH (ref 11.5–15.5)
WBC: 3.4 K/uL — ABNORMAL LOW (ref 4.0–10.5)
nRBC: 0 % (ref 0.0–0.2)

## 2024-02-25 LAB — BASIC METABOLIC PANEL WITH GFR
Anion gap: 12 (ref 5–15)
BUN: 19 mg/dL (ref 8–23)
CO2: 17 mmol/L — ABNORMAL LOW (ref 22–32)
Calcium: 7.2 mg/dL — ABNORMAL LOW (ref 8.9–10.3)
Chloride: 113 mmol/L — ABNORMAL HIGH (ref 98–111)
Creatinine, Ser: 1.06 mg/dL — ABNORMAL HIGH (ref 0.44–1.00)
GFR, Estimated: 54 mL/min — ABNORMAL LOW (ref >60)
Glucose, Bld: 153 mg/dL — ABNORMAL HIGH (ref 70–99)
Potassium: 3.6 mmol/L (ref 3.5–5.1)
Sodium: 142 mmol/L (ref 135–145)

## 2024-02-25 LAB — BPAM RBC
Blood Product Expiration Date: 202509062359
ISSUE DATE / TIME: 202508051036
Unit Type and Rh: 5100

## 2024-02-25 LAB — BPAM PLATELET PHERESIS
Blood Product Expiration Date: 202508072359
ISSUE DATE / TIME: 202508051015
Unit Type and Rh: 5100

## 2024-02-25 LAB — MAGNESIUM: Magnesium: 1.4 mg/dL — ABNORMAL LOW (ref 1.7–2.4)

## 2024-02-25 LAB — BRAIN NATRIURETIC PEPTIDE: B Natriuretic Peptide: 312.3 pg/mL — ABNORMAL HIGH (ref 0.0–100.0)

## 2024-02-25 MED ORDER — METOPROLOL SUCCINATE ER 50 MG PO TB24
50.0000 mg | ORAL_TABLET | Freq: Every day | ORAL | Status: DC
  Administered 2024-02-25 – 2024-03-02 (×9): 50 mg via ORAL
  Filled 2024-02-25 (×7): qty 1

## 2024-02-25 MED ORDER — NYSTATIN 100000 UNIT/ML MT SUSP
5.0000 mL | Freq: Four times a day (QID) | OROMUCOSAL | Status: DC
  Administered 2024-02-25 – 2024-03-02 (×28): 500000 [IU] via ORAL
  Filled 2024-02-25 (×24): qty 5

## 2024-02-25 MED ORDER — GABAPENTIN 100 MG PO CAPS
100.0000 mg | ORAL_CAPSULE | Freq: Every day | ORAL | Status: DC
  Administered 2024-02-25 – 2024-03-01 (×7): 100 mg via ORAL
  Filled 2024-02-25 (×6): qty 1

## 2024-02-25 MED ORDER — ADULT MULTIVITAMIN W/MINERALS CH
1.0000 | ORAL_TABLET | Freq: Every day | ORAL | Status: DC
  Administered 2024-02-26 – 2024-03-02 (×8): 1 via ORAL
  Filled 2024-02-25 (×6): qty 1

## 2024-02-25 MED ORDER — IRON SUCROSE 300 MG IVPB - SIMPLE MED
300.0000 mg | Freq: Once | Status: AC
  Administered 2024-02-25: 300 mg via INTRAVENOUS
  Filled 2024-02-25: qty 300

## 2024-02-25 MED ORDER — ALBUMIN HUMAN 25 % IV SOLN
25.0000 g | Freq: Once | INTRAVENOUS | Status: AC
  Administered 2024-02-25: 12.5 g via INTRAVENOUS
  Filled 2024-02-25: qty 100

## 2024-02-25 MED ORDER — MAGNESIUM SULFATE 4 GM/100ML IV SOLN
4.0000 g | Freq: Once | INTRAVENOUS | Status: AC
  Administered 2024-02-25: 4 g via INTRAVENOUS
  Filled 2024-02-25: qty 100

## 2024-02-25 MED ORDER — SERTRALINE HCL 50 MG PO TABS
50.0000 mg | ORAL_TABLET | Freq: Every day | ORAL | Status: DC
  Administered 2024-02-25 – 2024-03-02 (×9): 50 mg via ORAL
  Filled 2024-02-25 (×7): qty 1

## 2024-02-25 MED ORDER — FUROSEMIDE 10 MG/ML IJ SOLN
20.0000 mg | Freq: Once | INTRAMUSCULAR | Status: AC
  Administered 2024-02-25: 20 mg via INTRAVENOUS
  Filled 2024-02-25: qty 2

## 2024-02-25 MED ORDER — BOOST / RESOURCE BREEZE PO LIQD CUSTOM
1.0000 | Freq: Three times a day (TID) | ORAL | Status: DC
  Administered 2024-02-25 – 2024-02-29 (×14): 1 via ORAL

## 2024-02-25 NOTE — Plan of Care (Signed)
  Problem: Education: Goal: Knowledge of General Education information will improve Description: Including pain rating scale, medication(s)/side effects and non-pharmacologic comfort measures Outcome: Progressing   Problem: Health Behavior/Discharge Planning: Goal: Ability to manage health-related needs will improve Outcome: Progressing   Problem: Clinical Measurements: Goal: Ability to maintain clinical measurements within normal limits will improve Outcome: Progressing Goal: Will remain free from infection Outcome: Progressing Goal: Cardiovascular complication will be avoided Outcome: Progressing   Problem: Nutrition: Goal: Adequate nutrition will be maintained Outcome: Progressing   Problem: Coping: Goal: Level of anxiety will decrease Outcome: Progressing   Problem: Pain Managment: Goal: General experience of comfort will improve and/or be controlled Outcome: Progressing   Problem: Safety: Goal: Ability to remain free from injury will improve Outcome: Progressing

## 2024-02-25 NOTE — Care Management Important Message (Signed)
 Important Message  Patient Details  Name: [name] MRN: [id number] Date of Birth: 25-Dec-1945   Important Message Given:  Yes - Medicare IM     Rojelio [name] 02/25/2024, 12:25 PM

## 2024-02-25 NOTE — Progress Notes (Addendum)
 CC: POD # 1 Subjective: Pain is better and overall she looks better  Wbc and pl improving Good UO Acidotic and creat bumped  Objective: Vital signs in last 24 hours: Temp:  [96.6 F (35.9 C)-98.4 F (36.9 C)] 98.2 F (36.8 C) (08/06 1200) Pulse Rate:  [83-111] 111 (08/06 1215) Resp:  [11-36] 36 (08/06 1215) BP: (73-170)/(39-91) 170/88 (08/06 1200) SpO2:  [98 %-100 %] 100 % (08/06 1215) Weight:  [85.7 kg] 85.7 kg (08/06 0500) Last BM Date : 02/24/24  Intake/Output from previous day: 08/05 0701 - 08/06 0700 In: 3401.3 [I.V.:2067.1; Blood:596; IV Piggyback:738.2] Out: 1110 [Urine:800; Drains:160; Blood:150] Intake/Output this shift: Total I/O In: 615.6 [I.V.:499.8; IV Piggyback:115.8] Out: 150 [Urine:150]  Physical exam:  Chronically ill Jai:dnqu , ostomy w/o output. Midline wound w dressing and penrose in place, some serosanguinous sweat. JP serous  Lab Results: CBC  Recent Labs    02/24/24 1652 02/25/24 0341  WBC 1.1* 3.4*  HGB 9.0* 8.9*  HCT 27.4* 28.0*  PLT 99* 117*   BMET Recent Labs    02/24/24 0416 02/25/24 0341  NA 141 142  K 4.2 3.6  CL 113* 113*  CO2 20* 17*  GLUCOSE 114* 153*  BUN 14 19  CREATININE 0.81 1.06*  CALCIUM  7.5* 7.2*   PT/INR Recent Labs    02/23/24 1058  LABPROT 17.4*  INR 1.3*   ABG Recent Labs    02/23/24 2022  HCO3 17.6*    Studies/Results: DG ABD ACUTE 2+V W 1V CHEST Result Date: 02/24/2024 CLINICAL DATA:  [id number] Diverticulitis large intestine [id number] EXAM: DG ABDOMEN ACUTE WITH 1 VIEW CHEST COMPARISON:  February 23, 2024 FINDINGS: Nonobstructive bowel gas pattern. No pneumoperitoneum. No organomegaly or radiopaque calculi. Low lung volumes with bilateral perihilar interstitial opacities. Elevation of the right hemidiaphragm. No focal airspace consolidation, pleural effusion, or pneumothorax. Moderate cardiomegaly. Left chest port, catheter terminating in the region of the brachiocephalic vein confluence. Osteopenia. No  acute fracture or destructive lesion.Multilevel degenerative disc disease of the spine. IMPRESSION: 1. Nonobstructive bowel gas pattern. 2. Lower lung volumes. Bilateral perihilar interstitial opacities, which may be due to low lung volumes, interstitial edema, or atypical/viral infection. Electronically Signed   By: Rogelia Myers M.D.   On: 02/24/2024 11:00   CT ABDOMEN PELVIS W CONTRAST Result Date: 02/23/2024 EXAM: CT ABDOMEN AND PELVIS WITH CONTRAST 02/23/2024 01:57:47 PM TECHNIQUE: CT of the abdomen and pelvis was performed with the administration of intravenous contrast. Multiplanar reformatted images are provided for review. Automated exposure control, iterative reconstruction, and/or weight based adjustment of the mA/kV was utilized to reduce the radiation dose to as low as reasonably achievable. COMPARISON: 01/30/2024. CLINICAL HISTORY: Eval divertic and complications. Hx recent divertic, worsening bloody/mucous stool, RLQ pain. Patient to ED via POV for abd pain. Having nausea and weakness for the past few days. Had chemo on Tuesday for breast cancer. Hx diverticulitis. FINDINGS: LOWER CHEST: Motion degradation at the lung bases. 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 GALLBLADDER AND BILE DUCTS: Gallbladder is unremarkable. No biliary ductal dilatation. SPLEEN: No acute abnormality. PANCREAS: No acute abnormality. ADRENAL GLANDS: No acute abnormality. KIDNEYS, URETERS AND BLADDER: No hydronephrosis. No perinephric or periureteral stranding. Urinary bladder is unremarkable. GI AND BOWEL: Tiny hiatal hernia. Moderate inflammation surrounding the sigmoid. pericolic fluid and gas, including at up to 3.5 cm on 66/2. Prominent pericolonic  gas adjacent to more proximal sigmoid including on 63/2 . PERITONEUM AND RETROPERITONEUM: No ascites. No free air.  VASCULATURE: Advanced abdominal aortic atherosclerosis. LYMPH NODES: No lymphadenopathy. REPRODUCTIVE ORGANS: Hysterectomy. BONES AND SOFT TISSUES: Lumbosacral spondylosis. Abdominal pelvic degradation secondary to patient body habitus and minimal motion. IMPRESSION: 1. Moderate inflammation surrounding the sigmoid, most consistent with diverticulitis. Foci of proximal and distal pericolonic fluid and gas may represent inflamed diverticula or early developing collections. Depending on clinical symptomatology, consider CT follow-up at 3 to 5 days. 2. Mild degradation as detailed above. Electronically signed by: Rockey Kilts MD 02/23/2024 02:23 PM EDT RP Workstation: [id number]    Anti-infectives: Anti-infectives (From admission, onward)    Start     Dose/Rate Route Frequency Ordered Stop   02/24/24 1200  vancomycin  (VANCOREADY) IVPB 750 mg/150 mL        750 mg 150 mL/hr over 60 Minutes Intravenous Every 24 hours 02/23/24 1548     02/24/24 1015  cefoTEtan  (CEFOTAN ) 2 g in sodium chloride  0.9 % 100 mL IVPB        2 g 200 mL/hr over 30 Minutes Intravenous  Once 02/24/24 0926 02/24/24 1144   02/23/24 2200  piperacillin -tazobactam (ZOSYN ) IVPB 3.375 g        3.375 g 12.5 mL/hr over 240 Minutes Intravenous Every 8 hours 02/23/24 1548     02/23/24 1230  vancomycin  (VANCOREADY) IVPB 1750 mg/350 mL        1,750 mg 175 mL/hr over 120 Minutes Intravenous  Once 02/23/24 1215 02/23/24 1457   02/23/24 1100  piperacillin -tazobactam (ZOSYN ) IVPB 3.375 g        3.375 g 100 mL/hr over 30 Minutes Intravenous  Once 02/23/24 1055 02/23/24 1159       Assessment/Plan: Ileus following hartmann's Still a bit dry will do albumin  Mobilize May do CLD Continue a/bs  Ostomy nurse consult   Laneta Luna, MD, FACS  02/25/2024

## 2024-02-25 NOTE — Progress Notes (Addendum)
 Nutrition Follow Up Note   DOCUMENTATION CODES:   Obesity unspecified  INTERVENTION:   Boost Breeze po TID, each supplement provides 250 kcal and 9 grams of protein  MVI po daily   Pt at high refeed risk; recommend monitor potassium, magnesium  and phosphorus labs daily until stable  Daily weights   NUTRITION DIAGNOSIS:   Inadequate oral intake related to acute illness as evidenced by NPO status. -ongoing   GOAL:   Patient will meet greater than or equal to 90% of their needs -not met   MONITOR:   PO intake, Supplement acceptance, Labs, Weight trends, Skin, I & O's  ASSESSMENT:   78 y/o female with h/o breast cancer s/p lumpectomy on chemotherapy, anxiety, depression, pulmonary hypertension, GERD, OSA and recent admssion for diverticulitis who is admitted for recurrent complicated diverticulitis (now s/p Hartmann's procedure, takedown of splenic flexure, repair of ventral hernia & appendectomy 8/5), sepsis and shock.  Met with pt in room today; pt reports that she is feeling ok. Pt reports some abdominal soreness. Pt reports that at baseline, she is not a very big eater. Pt does report that she drinks strawberry Ensure at home. RD discussed with pt the importance of adequate nutrition needed to preserve lean muscle and support post op healing. Pt initiated on a clear liquid diet today. RD will add Boost Breeze to help pt meet her estimated needs. Pt is at high refeed risk. Pt denies any BM or flatus today. Per chart, pt is up ~10lbs from admission. Pt  is up ~15lbs from her UBW. Pt +8.7L on her I & Os.   Medications reviewed and include: heparin , insulin , NaCl w/ 5% dextrose @100ml /hr, zosyn , vancomycin    Labs reviewed: K 3.6 wnl, creat 1.06(H), Mg 1.4(L) Wbc- 3.4(L), Hgb 8.9(L), Hct 28.0(L) Cbgs- 128, 133, 142 x 24 hrs   Drains-   NUTRITION - FOCUSED PHYSICAL EXAM:  Flowsheet Row Most Recent Value  Orbital Region No depletion  Upper Arm Region No depletion   Thoracic and Lumbar Region No depletion  Buccal Region No depletion  Temple Region No depletion  Clavicle Bone Region No depletion  Clavicle and Acromion Bone Region No depletion  Scapular Bone Region No depletion  Dorsal Hand Mild depletion  Patellar Region No depletion  Anterior Thigh Region No depletion  Posterior Calf Region No depletion  Edema (RD Assessment) None  Hair Reviewed  Eyes Reviewed  Mouth Reviewed  Skin Reviewed  Nails Reviewed   Diet Order:   Diet Order             Diet clear liquid Room service appropriate? Yes; Fluid consistency: Thin  Diet effective now                  EDUCATION NEEDS:   Not appropriate for education at this time  Skin:  Skin Assessment: Reviewed RN Assessment (incision abdomen)  Last BM:  8/5- type 6  Height:   Ht Readings from Last 1 Encounters:  02/24/24 5' (1.524 m)    Weight:   Wt Readings from Last 1 Encounters:  02/25/24 85.7 kg    Ideal Body Weight:  45.45 kg  BMI:  Body mass index is 36.9 kg/m.  Estimated Nutritional Needs:   Kcal:  1700-2000kcal/day  Protein:  85-100g/day  Fluid:  1.4-1.6L/day  Augustin Shams MS, RD, LDN If unable to be reached, please send secure chat to RD inpatient available from 8:00a-4:00p daily

## 2024-02-25 NOTE — Consult Note (Signed)
 WOC consulted for new ostomy; patient is in the ICU; DOS 8/5 per Dr. Jordis Dr. Jordis replaced pouch and added ostomy paste to aid in seal, stoma in a crease.   WOC Becker to bedside nursing today; pouch intact. WOC will plan to see patient Friday this week to initiate teaching with patient and any available family.  [name] [facility], CNS, CWON-AP [phone]

## 2024-02-25 NOTE — Progress Notes (Signed)
 Progress Note   Patient: [name] FMW:[id number] DOB: [birth year] DOA: 02/23/2024     2 DOS: the patient was seen and examined on 02/25/2024   Brief hospital course:  JESSELLE LAFLAMME is a 78 y.o. female with medical history significant of breast cancer (currently undergoing treatment), HLD, HTN who presented from home with complaint of abdominal pain.  She was recently discharged from here on 7/15 after she was admitted for acute diverticulitis and was managed with antibiotics.    ED course: afebrile, in sinus tachycardia.  Blood pressure was soft on presentation but she responded to IV fluid. potassium of 3.2, creatinine of 1.26, WC count of 0.5, hemoglobin of 8.7, platelet of 76, lactate of 4. Chest x-ray did not show any acute findings.  CT Abdomen/Pelvis showed moderate inflammation surrounding the sigmoid, most consistent with diverticulitis.  Patient started on broad-spectrum antibiotics. Blood culture sent.General surgery consulted   Patient was admitted to medicine service for further workup and management of sepsis as outlined in detail below.   02/24/24 -feels improved on my exam after receiving breathing treatment and pain medication. Had worsening abdominal pain and tachypnea this am. General surgery planning to bring to OR for ex-lap and colostomy placement.    I assumed care on 02/25/2024, post-op Day 1. Patient having tachypnea and tachycardia, appears possibly volume overloaded. CXR and BNP to evaluate.  Trial IV Lasix  and stopped IV fluids.   Assessment and Plan:  Severe sepsis secondary to diverticulitis: general surgery following, appreciate your care. Repeat abdominal imaging this am is pending read. 02/24/2024: Underwent open Hartman's procedure, colostomy creation --Continue vancomycin  and Zosyn  --Follow-up blood cultures --On clear liquids, diet per surgery --Pain control as needed per orders, adjust as needed   In setting of R breast DCIS, currently on  chemotherapy- pancytopenia Leukopenia- WBC 0.5>1.0, S/p Granix  Thrombocytopenia- platelets 76>78... 99>>117.  Given 1 unit platelets preoperatively 8/5  Macrocytic anemia- Hgb 8.7>7.7. This is most likely secondary to her malignancy, chemotherapy. Status post 1u pRBCs transfused preoperatively on 8/5 --Continue monitor CBC  Tachycardia, tachypnea -chest x-ray and BNP to evaluate. BNP elevated 312.3 Clinically suspect patient getting volume overloaded.   While I/O's are likely inaccurate, weight is up and positive fluid balance over 8 L since admission. --Hold IV fluids --Lasix  20 mg IV x 1 dose for now, further diuresis as needed --Follow results of chest x-ray    AKI: Resolved  -- Monitor BMP -- Stop IV fluids as above  Hypokalemia: Supplemented with potassium -- Monitor BMP and replace K as needed --Check mag level  Hypomagnesemia -mag 1.4 this morning --4 g IV mag sulfate to replace   History of hypertension: Hypotensive on presentation which resolved with IV fluids.  Antihypertensives were held which may have caused reflexive tachycardia. She has been in sinus rhythm around 110-120 overnight. Denies flutter, chest pain --Resumed on home metoprolol   --Telemetry monitoring --Titrate regimen   Hyperlipidemia:  --Statin currently on hold    Obesity Body mass index is 35.13 kg/m. Complicates overall care and prognosis.  Recommend lifestyle modifications including physical activity and diet for weight loss and overall long-term health.       Subjective: Patient seen awake resting in bed in ICU this morning.  She reports a dry mouth, reports pain fairly well-controlled.  She denies shortness of breath or chest pain for me.  Later this afternoon, patient became tachypneic and tachycardic and feeling short of breath.  IV fluids were held.  Physical Exam: Vitals:  02/25/24 1130 02/25/24 1145 02/25/24 1200 02/25/24 1215  BP:   (!) 170/88   Pulse: 92 95 (!) 106 (!) 111   Resp: (!) 25 (!) 25 (!) 32 (!) 36  Temp:   98.2 F (36.8 C)   TempSrc:   Axillary   SpO2: 100% 100% 100% 100%  Weight:      Height:       General exam: awake, alert, no acute distress, chronically ill-appearing HEENT: moist mucus membranes, hearing grossly normal  Respiratory system: CTAB diminished bases bilaterally, no expiratory wheezes, no rhonchi, normal respiratory effort at rest on 2 L/min [place] O2 with no accessory muscle use. Cardiovascular system: normal S1/S2, RRR  Gastrointestinal system: soft, midline incision with occlusive honeycomb dressing intact, JP drain in place with serosanguineous fluid, colostomy noted Central nervous system: A&O x 3. no gross focal neurologic deficits, normal speech Skin: dry, intact, normal temperature, no rashes in visualized skin Psychiatry: normal mood, congruent affect, judgement and insight appear normal   Data Reviewed:  Notable labs --  Chloride 113 Bicarb 17 Glucose 153 Creatinine 1.06 Calcium  7.2 Mag 1.4 WBC 3.4 Hemoglobin 8.9 stable Platelets improved 117  BNP 312.3  Pending portable chest x-ray   Family Communication: None present on rounds, will attempt to call as time allows  Disposition: Status is: Inpatient Remains inpatient appropriate because: Severity of illness as outlined above, on IV therapies   Planned Discharge Destination: Skilled nursing facility    Time spent: 55 minutes including time spent at bedside and coronation of care with staff and consultants  Author: Burnard DELENA Cunning, DO 02/25/2024 2:55 PM  For on call review [email or web address].

## 2024-02-25 NOTE — Plan of Care (Signed)
  Problem: Clinical Measurements: Goal: Ability to maintain clinical measurements within normal limits will improve Outcome: Progressing Goal: Will remain free from infection Outcome: Progressing Goal: Diagnostic test results will improve Outcome: Progressing Goal: Respiratory complications will improve Outcome: Progressing   Problem: Pain Managment: Goal: General experience of comfort will improve and/or be controlled Outcome: Progressing

## 2024-02-26 ENCOUNTER — Inpatient Hospital Stay: Admitting: Hematology

## 2024-02-26 ENCOUNTER — Other Ambulatory Visit: Payer: Self-pay | Admitting: Family Medicine

## 2024-02-26 ENCOUNTER — Inpatient Hospital Stay

## 2024-02-26 DIAGNOSIS — A419 Sepsis, unspecified organism: Secondary | ICD-10-CM | POA: Diagnosis not present

## 2024-02-26 DIAGNOSIS — R6520 Severe sepsis without septic shock: Secondary | ICD-10-CM | POA: Diagnosis not present

## 2024-02-26 LAB — SURGICAL PATHOLOGY

## 2024-02-26 LAB — GLUCOSE, CAPILLARY
Glucose-Capillary: 110 mg/dL — ABNORMAL HIGH (ref 70–99)
Glucose-Capillary: 116 mg/dL — ABNORMAL HIGH (ref 70–99)
Glucose-Capillary: 149 mg/dL — ABNORMAL HIGH (ref 70–99)
Glucose-Capillary: 190 mg/dL — ABNORMAL HIGH (ref 70–99)
Glucose-Capillary: 82 mg/dL (ref 70–99)
Glucose-Capillary: 88 mg/dL (ref 70–99)
Glucose-Capillary: 92 mg/dL (ref 70–99)

## 2024-02-26 LAB — BASIC METABOLIC PANEL WITH GFR
Anion gap: 8 (ref 5–15)
BUN: 22 mg/dL (ref 8–23)
CO2: 22 mmol/L (ref 22–32)
Calcium: 8 mg/dL — ABNORMAL LOW (ref 8.9–10.3)
Chloride: 111 mmol/L (ref 98–111)
Creatinine, Ser: 0.95 mg/dL (ref 0.44–1.00)
GFR, Estimated: >60 mL/min (ref >60)
Glucose, Bld: 86 mg/dL (ref 70–99)
Potassium: 3.5 mmol/L (ref 3.5–5.1)
Sodium: 141 mmol/L (ref 135–145)

## 2024-02-26 LAB — CBC
HCT: 28.1 % — ABNORMAL LOW (ref 36.0–46.0)
Hemoglobin: 9.2 g/dL — ABNORMAL LOW (ref 12.0–15.0)
MCH: 31.8 pg (ref 26.0–34.0)
MCHC: 32.7 g/dL (ref 30.0–36.0)
MCV: 97.2 fL (ref 80.0–100.0)
Platelets: 107 K/uL — ABNORMAL LOW (ref 150–400)
RBC: 2.89 MIL/uL — ABNORMAL LOW (ref 3.87–5.11)
RDW: 16.8 % — ABNORMAL HIGH (ref 11.5–15.5)
WBC: 20.8 K/uL — ABNORMAL HIGH (ref 4.0–10.5)
nRBC: 0.4 % — ABNORMAL HIGH (ref 0.0–0.2)

## 2024-02-26 LAB — PHOSPHORUS: Phosphorus: 2.2 mg/dL — ABNORMAL LOW (ref 2.5–4.6)

## 2024-02-26 LAB — MAGNESIUM: Magnesium: 2.4 mg/dL (ref 1.7–2.4)

## 2024-02-26 MED ORDER — MELATONIN 5 MG PO TABS
2.5000 mg | ORAL_TABLET | Freq: Every day | ORAL | Status: DC
  Filled 2024-02-26: qty 1
  Filled 2024-02-26: qty 0.5

## 2024-02-26 MED ORDER — K PHOS MONO-SOD PHOS DI & MONO 155-852-130 MG PO TABS
250.0000 mg | ORAL_TABLET | Freq: Three times a day (TID) | ORAL | Status: AC
  Administered 2024-02-26 (×3): 250 mg via ORAL
  Filled 2024-02-26 (×4): qty 1

## 2024-02-26 MED ORDER — MELATONIN 5 MG PO TABS
2.5000 mg | ORAL_TABLET | Freq: Every day | ORAL | Status: DC
  Administered 2024-02-26 – 2024-03-01 (×7): 2.5 mg via ORAL
  Filled 2024-02-26 (×5): qty 1

## 2024-02-26 NOTE — Progress Notes (Signed)
 Progress Note   Patient: [name] FMW:[id number] DOB: [birth year] DOA: 02/23/2024     3 DOS: the patient was seen and examined on 02/26/2024   Brief hospital course:  [name] is a 78 y.o. female with medical history significant of breast cancer (currently undergoing treatment), HLD, HTN who presented from home with complaint of abdominal pain.  She was recently discharged from here on 7/15 after she was admitted for acute diverticulitis and was managed with antibiotics.    ED course: afebrile, in sinus tachycardia.  Blood pressure was soft on presentation but she responded to IV fluid. potassium of 3.2, creatinine of 1.26, WC count of 0.5, hemoglobin of 8.7, platelet of 76, lactate of 4. Chest x-ray did not show any acute findings.  CT Abdomen/Pelvis showed moderate inflammation surrounding the sigmoid, most consistent with diverticulitis.  Patient started on broad-spectrum antibiotics. Blood culture sent.General surgery consulted   Patient was admitted to medicine service for further workup and management of sepsis as outlined in detail below.   02/24/24 -feels improved on my exam after receiving breathing treatment and pain medication. Had worsening abdominal pain and tachypnea this am. General surgery planning to bring to OR for ex-lap and colostomy placement.    I assumed care on 02/25/2024, post-op Day 1. Patient having tachypnea and tachycardia, appears possibly volume overloaded. CXR and BNP to evaluate.  Trial IV Lasix  and stopped IV fluids.   Assessment and Plan:  Severe sepsis secondary to diverticulitis: general surgery following, appreciate your care. Repeat abdominal imaging this am is pending read. 02/24/2024: Underwent open Hartman's procedure, colostomy creation --Continue vancomycin  and Zosyn  --Follow-up blood cultures --On clear liquids, diet per surgery --Pain control as needed per orders, adjust as needed   In setting of R breast DCIS, currently on  chemotherapy- pancytopenia Leukopenia- WBC 0.5>1.0, S/p Granix  Thrombocytopenia- platelets 76>78... 99>>117.  Given 1 unit platelets preoperatively 8/5  Macrocytic anemia- Hgb 8.7>7.7. This is most likely secondary to her malignancy, chemotherapy. Status post 1u pRBCs transfused preoperatively on 8/5 --Continue monitor CBC  Tachycardia, tachypnea -chest x-ray and BNP to evaluate. 8/6 - BNP elevated 312.3 and CXR showing low lung volumes, similar pulmonary vascular congestion in perihilar region.  RR in 30's. 87 -- vitals improved today, RR only intermittently in low 20's Clinically suspect patient getting volume overloaded.   While I/O's are likely inaccurate, weight is up and positive fluid balance over 8 L since admission. --Hold IV fluids --Given Lasix  20 mg IV x 1 on 8/6 afternoon --Further diuresis if needed    AKI: Resolved  -- Monitor BMP -- Stop IV fluids as above  Hypokalemia: Resolved with replacement -- Monitor BMP and replace K as needed --Check mag level  Hypomagnesemia -mag 1.4 was replaced on 8/6. Resolved --Monitor Mg level & replace PRN   History of hypertension: Hypotensive on presentation which resolved with IV fluids.  Antihypertensives were held which may have caused reflexive tachycardia. She has been in sinus rhythm around 110-120 overnight. Denies flutter, chest pain --Resumed on home metoprolol   --Telemetry monitoring --Titrate regimen   Hyperlipidemia:  --Statin currently on hold    Obesity Body mass index is 35.13 kg/m. Complicates overall care and prognosis.  Recommend lifestyle modifications including physical activity and diet for weight loss and overall long-term health.       Subjective: Patient up in recliner seen after ambulating with therapy this AM.  She reports minimal abdominal pain.  Felt okay with walking, little bit slow.  Feeling  less short of breath today than yesterday.  Physical Exam: Vitals:   02/26/24 1100 02/26/24 1134  02/26/24 1200 02/26/24 1300  BP: (!) 157/80  (!) 146/66 (!) 118/53  Pulse: 85  79 76  Resp: (!) 22  17 19   Temp:  97.9 F (36.6 C)    TempSrc:  Axillary    SpO2: 100%  100% 99%  Weight:      Height:       General exam: awake, alert, no acute distress, chronically ill-appearing HEENT: moist mucus membranes, hearing grossly normal  Respiratory system: CTAB diminished bases bilaterally, no expiratory wheezes, no rhonchi, normal respiratory effort at rest on 2 L/min [place] O2 with no accessory muscle use. Cardiovascular system: normal S1/S2, RRR  Gastrointestinal system: soft, midline incision with occlusive honeycomb dressing intact, JP drain in place with serosanguineous fluid, colostomy noted Central nervous system: A&O x 3. no gross focal neurologic deficits, normal speech Psychiatry: normal mood, congruent affect, judgement and insight appear normal   Data Reviewed:  Notable labs --  BMP normal except Ca 8.0, phos 2.2  WBC 3.4 >> 20.8 Hemoglobin 8.9 >> 9.2 stable Platelets 117 >> 107    Family Communication: None present on rounds, will attempt to call as time allows  Disposition: Status is: Inpatient Remains inpatient appropriate because: Severity of illness as outlined above, on IV therapies   Planned Discharge Destination: Skilled nursing facility    Time spent: 42 minutes  Author: Burnard DELENA Cunning, DO 02/26/2024 1:24 PM  For on call review [email or web address].

## 2024-02-26 NOTE — Plan of Care (Signed)
  Problem: Education: Goal: Knowledge of General Education information will improve Description: Including pain rating scale, medication(s)/side effects and non-pharmacologic comfort measures Outcome: Progressing   Problem: Health Behavior/Discharge Planning: Goal: Ability to manage health-related needs will improve Outcome: Progressing   Problem: Clinical Measurements: Goal: Ability to maintain clinical measurements within normal limits will improve Outcome: Progressing Goal: Diagnostic test results will improve Outcome: Progressing Goal: Respiratory complications will improve Outcome: Progressing   Problem: Activity: Goal: Risk for activity intolerance will decrease Outcome: Progressing   Problem: Nutrition: Goal: Adequate nutrition will be maintained Outcome: Progressing   Problem: Coping: Goal: Level of anxiety will decrease Outcome: Progressing   Problem: Elimination: Goal: Will not experience complications related to urinary retention Outcome: Progressing   Problem: Pain Managment: Goal: General experience of comfort will improve and/or be controlled Outcome: Progressing   Problem: Safety: Goal: Ability to remain free from injury will improve Outcome: Progressing   Problem: Skin Integrity: Goal: Risk for impaired skin integrity will decrease Outcome: Progressing   Problem: Fluid Volume: Goal: Hemodynamic stability will improve Outcome: Progressing   Problem: Clinical Measurements: Goal: Signs and symptoms of infection will decrease Outcome: Progressing   Problem: Respiratory: Goal: Ability to maintain adequate ventilation will improve Outcome: Progressing

## 2024-02-26 NOTE — Evaluation (Signed)
 Physical Therapy Evaluation Patient Details Name: [name] MRN: [id number] DOB: 1946/01/12 Today's Date: 02/26/2024  History of Present Illness  Patient is a 78 year old female with diverticulitis with worsening abdominal pain. S/p ex-lap with colostomy. PMH: breast cancer currently undergoing treatment, HLD, HTN  Clinical Impression  Patient is agreeable to PT evaluation. She lives with her son and is home alone during the day. She was previously ambulatory using a cane. No recent falls reported.  Today the patient reports minimal pain. She is motivated to participate. Patient ambulated in hallway with Min A using rolling walker for support. She is fatigued with activity. Recommend to continue PT to maximize independence and facilitate return to prior level of function. Rehabilitation < 3 hours/day recommended after this hospital stay.       If plan is discharge home, recommend the following: A little help with walking and/or transfers;A little help with bathing/dressing/bathroom;Assist for transportation;Assistance with cooking/housework   Can travel by private vehicle   No    Equipment Recommendations Rolling walker (2 wheels)  Recommendations for Other Services       Functional Status Assessment Patient has had a recent decline in their functional status and demonstrates the ability to make significant improvements in function in a reasonable and predictable amount of time.     Precautions / Restrictions Precautions Precautions: Fall Recall of Precautions/Restrictions: Intact Restrictions Weight Bearing Restrictions Per Provider Order: No      Mobility  Bed Mobility Overal bed mobility: Needs Assistance Bed Mobility: Rolling, Sidelying to Sit Rolling: Contact guard assist, Used rails Sidelying to sit: Min assist       General bed mobility comments: cues for logroll technique for comfort with abdominal incision. assistance for trunk support to sit upright     Transfers Overall transfer level: Needs assistance Equipment used: Rolling walker (2 wheels) Transfers: Sit to/from Stand Sit to Stand: Min assist           General transfer comment: lifting assistance required for standing. cues for hand palcement for safety    Ambulation/Gait Ambulation/Gait assistance: Min assist, +2 safety/equipment Gait Distance (Feet): 60 Feet Assistive device: Rolling walker (2 wheels) Gait Pattern/deviations: Step-through pattern, Decreased stride length, Trunk flexed Gait velocity: decreased     General Gait Details: cues to stand closer to rolling walker for support. patient does report leg fatigue with hallway ambulation  Stairs            Wheelchair Mobility     Tilt Bed    Modified Rankin (Stroke Patients Only)       Balance Overall balance assessment: Needs assistance Sitting-balance support: Feet supported Sitting balance-Leahy Scale: Good     Standing balance support: Bilateral upper extremity supported Standing balance-Leahy Scale: Poor Standing balance comment: heavy reliance on rolling walker for support                             Pertinent Vitals/Pain Pain Assessment Pain Assessment: Faces Faces Pain Scale: Hurts a little bit Pain Location: abdomen Pain Descriptors / Indicators: Discomfort Pain Intervention(s): Monitored during session, Limited activity within patient's tolerance, Repositioned    Home Living Family/patient expects to be discharged to:: Private residence Living Arrangements: Children (son) Available Help at Discharge: Family;Available PRN/intermittently Type of Home: House Home Access: Stairs to enter   [employer] of Steps: 1 step to get inside with a rail; 1 step in the home   Home Layout: One level  Home Equipment: Shower seat;Cane - single point Additional Comments: patient is home alone during the day    Prior Function Prior Level of Function :  Independent/Modified Independent             Mobility Comments: household ambulation with cane ADLs Comments: Mod I with difficulty     Extremity/Trunk Assessment   Upper Extremity Assessment Upper Extremity Assessment: Generalized weakness    Lower Extremity Assessment Lower Extremity Assessment: Generalized weakness       Communication   Communication Communication: No apparent difficulties    Cognition Arousal: Alert Behavior During Therapy: WFL for tasks assessed/performed   PT - Cognitive impairments: No apparent impairments                         Following commands: Intact       Cueing Cueing Techniques: Verbal cues     General Comments      Exercises     Assessment/Plan    PT Assessment Patient needs continued PT services  PT Problem List Decreased strength;Decreased range of motion;Decreased activity tolerance;Decreased balance;Decreased mobility;Decreased safety awareness;Decreased knowledge of use of DME       PT Treatment Interventions DME instruction;Stair training;Gait training;Functional mobility training;Therapeutic activities;Therapeutic exercise;Balance training;Neuromuscular re-education;Patient/family education    PT Goals (Current goals can be found in the Care Plan section)  Acute Rehab PT Goals Patient Stated Goal: rehab then home with son PT Goal Formulation: With patient Time For Goal Achievement: 03/11/24 Potential to Achieve Goals: Good    Frequency Min 2X/week     Co-evaluation PT/OT/SLP Co-Evaluation/Treatment: Yes Reason for Co-Treatment: Complexity of the patient's impairments (multi-system involvement);To address functional/ADL transfers PT goals addressed during session: Mobility/safety with mobility         AM-PAC PT 6 Clicks Mobility  Outcome Measure Help needed turning from your back to your side while in a flat bed without using bedrails?: A Little Help needed moving from lying on your back to  sitting on the side of a flat bed without using bedrails?: A Little Help needed moving to and from a bed to a chair (including a wheelchair)?: A Little Help needed standing up from a chair using your arms (e.g., wheelchair or bedside chair)?: A Little Help needed to walk in hospital room?: A Little Help needed climbing 3-5 steps with a railing? : A Lot 6 Click Score: 17    End of Session         PT Visit Diagnosis: Muscle weakness (generalized) (M62.81);Unsteadiness on feet (R26.81)    Time: 9049-8987 PT Time Calculation (min) (ACUTE ONLY): 22 min   Charges:   PT Evaluation $PT Eval Moderate Complexity: 1 Mod   PT General Charges $$ ACUTE PT VISIT: 1 Visit         [name], PT, MPT   [name] 02/26/2024, 10:42 AM

## 2024-02-26 NOTE — Progress Notes (Signed)
 Patient transferred. AxOx4. VSS. All patient belongings kept at bedside transferred with patient.

## 2024-02-26 NOTE — Plan of Care (Signed)
  Problem: Clinical Measurements: Goal: Ability to maintain clinical measurements within normal limits will improve Outcome: Progressing Goal: Will remain free from infection Outcome: Progressing Goal: Diagnostic test results will improve Outcome: Progressing Goal: Respiratory complications will improve Outcome: Progressing Goal: Cardiovascular complication will be avoided Outcome: Progressing   Problem: Nutrition: Goal: Adequate nutrition will be maintained Outcome: Progressing   Problem: Pain Managment: Goal: General experience of comfort will improve and/or be controlled Outcome: Progressing

## 2024-02-26 NOTE — Evaluation (Addendum)
 Occupational Therapy Evaluation Patient Details Name: [name] MRN: [id number] DOB: 1945-09-22 Today's Date: 02/26/2024   History of Present Illness   Patient is a 78 year old female with diverticulitis with worsening abdominal pain. S/p ex-lap with colostomy. PMH: breast cancer currently undergoing treatment, HLD, HTN     Clinical Impressions Pt was seen for PT/OT co-evaluation this date to maximize pt/therapist safety and management of lines/leads. PTA, pt resides with her son in a one level home with 1 step outside the home and 1 step inside the home. She is typically MOD I with ADLs, although they are difficult. Ambulates MOD I with use of SPC typically.   Pt presents to acute OT demonstrating impaired ADL performance and functional mobility 2/2 pain, weakness, low activity tolerance and mild balance deficits. Pt currently requires Min A for bed mobility via log roll for trunkal assist. Pt required Min A for STS from EOB and ~30 ft of ambulation using RW with Min A for RW management, cues for proximity to RW. 2nd assist during session for lines/leads management, pt with JP drain, ostomy, and cathter. Anticipate Max A for LB ADL management at this time. She was left seated in recliner with all needs in place. Pain seems well managed and pt is highly motivated to improve.  Pt would benefit from skilled OT services to address noted impairments and functional limitations to maximize safety and independence while minimizing falls risk and caregiver burden. Do anticipate the need for follow up OT services upon acute hospital DC.      If plan is discharge home, recommend the following:   A little help with walking and/or transfers;Assistance with cooking/housework;Assist for transportation;A lot of help with bathing/dressing/bathroom     Functional Status Assessment   Patient has had a recent decline in their functional status and demonstrates the ability to make significant  improvements in function in a reasonable and predictable amount of time.     Equipment Recommendations   Other (comment) (defer)     Recommendations for Other Services         Precautions/Restrictions   Precautions Precautions: Fall Recall of Precautions/Restrictions: Intact Restrictions Weight Bearing Restrictions Per Provider Order: No     Mobility Bed Mobility Overal bed mobility: Needs Assistance Bed Mobility: Rolling, Sidelying to Sit Rolling: Contact guard assist, Used rails Sidelying to sit: Min assist       General bed mobility comments: cueing for logroll technique with abdominal incision; Min A for trunkal elevation    Transfers Overall transfer level: Needs assistance Equipment used: Rolling walker (2 wheels) Transfers: Sit to/from Stand Sit to Stand: Min assist           General transfer comment: Min A to stand from EOB, cueing for hand placement to push up from EOB; ambulated out into ICU hallway ~30+ ft using RW with MinA      Balance Overall balance assessment: Needs assistance Sitting-balance support: Feet supported Sitting balance-Leahy Scale: Good     Standing balance support: Bilateral upper extremity supported, Reliant on assistive device for balance Standing balance-Leahy Scale: Poor Standing balance comment: RW dependence in standing/during ambulation; cues for RW proximity                           ADL either performed or assessed with clinical judgement   ADL Overall ADL's : Needs assistance/impaired  General ADL Comments: bed mobility with Min A, STS and ambulation using RW with Min A; anticipate Max A for LB ADL management at this time, Min to setup for UB ADLs     Vision         Perception         Praxis         Pertinent Vitals/Pain Pain Assessment Pain Assessment: Faces Faces Pain Scale: Hurts a little bit Pain Location: abdomen Pain  Descriptors / Indicators: Discomfort Pain Intervention(s): Monitored during session, Repositioned, Limited activity within patient's tolerance     Extremity/Trunk Assessment Upper Extremity Assessment Upper Extremity Assessment: Generalized weakness   Lower Extremity Assessment Lower Extremity Assessment: Generalized weakness       Communication Communication Communication: No apparent difficulties   Cognition Arousal: Alert Behavior During Therapy: WFL for tasks assessed/performed                                 Following commands: Intact       Cueing  General Comments   Cueing Techniques: Verbal cues  VSS   Exercises Other Exercises Other Exercises: Edu on role of OT in acute setting, DC recommendations.   Shoulder Instructions      Home Living Family/patient expects to be discharged to:: Private residence Living Arrangements: Children (son) Available Help at Discharge: Family;Available PRN/intermittently Type of Home: House Home Access: Stairs to enter [employer] of Steps: 1 step to get inside with a rail; 1 step in the home   Home Layout: One level     Bathroom Shower/Tub: Chief Strategy Officer: Handicapped height     Home Equipment: Production assistant, radio - single point   Additional Comments: patient is home alone during the day      Prior Functioning/Environment Prior Level of Function : Independent/Modified Independent             Mobility Comments: household ambulation with cane ADLs Comments: Mod I with difficulty    OT Problem List: Decreased strength;Decreased activity tolerance;Impaired balance (sitting and/or standing);Decreased safety awareness;Decreased knowledge of use of DME or AE   OT Treatment/Interventions: Self-care/ADL training;Therapeutic exercise;Energy conservation;Therapeutic activities;DME and/or AE instruction;Patient/family education;Balance training      OT Goals(Current goals can  be found in the care plan section)   Acute Rehab OT Goals Patient Stated Goal: get stronger OT Goal Formulation: With patient Time For Goal Achievement: 03/11/24 Potential to Achieve Goals: Fair ADL Goals Pt Will Perform Lower Body Bathing: with min assist;sit to/from stand;sitting/lateral leans;with adaptive equipment;with contact guard assist Pt Will Perform Lower Body Dressing: with min assist;with contact guard assist;sitting/lateral leans;sit to/from stand;with adaptive equipment Pt Will Transfer to Toilet: with supervision;ambulating;regular height toilet   OT Frequency:  Min 2X/week    Co-evaluation PT/OT/SLP Co-Evaluation/Treatment: Yes Reason for Co-Treatment: Complexity of the patient's impairments (multi-system involvement);To address functional/ADL transfers PT goals addressed during session: Mobility/safety with mobility        AM-PAC OT 6 Clicks Daily Activity     Outcome Measure Help from another person eating meals?: None Help from another person taking care of personal grooming?: None Help from another person toileting, which includes using toliet, bedpan, or urinal?: A Lot Help from another person bathing (including washing, rinsing, drying)?: A Lot Help from another person to put on and taking off regular upper body clothing?: A Little Help from another person to put on and taking off regular lower body  clothing?: A Lot 6 Click Score: 17   End of Session Equipment Utilized During Treatment: Rolling walker (2 wheels) Nurse Communication: Mobility status  Activity Tolerance: Patient tolerated treatment well Patient left: in chair;with call bell/phone within reach;with chair alarm set  OT Visit Diagnosis: Unsteadiness on feet (R26.81);Muscle weakness (generalized) (M62.81)                Time: 9047-8987 OT Time Calculation (min): 20 min Charges:  OT General Charges $OT Visit: 1 Visit OT Evaluation $OT Eval Moderate Complexity: 1 Mod Alayah Knouff,  OTR/L 02/26/24, 12:13 PM  Dominigue Gellner E Quindell Shere 02/26/2024, 12:08 PM

## 2024-02-26 NOTE — Progress Notes (Addendum)
 CC: POD # 2 Subjective: Up in chair, just assisted to get more upright for her CLD tray.  Wbc UP as expected, and pl good, considering.  UO only 100 mL recorded, but Cr better Creat improved, bicarb normal.   Objective: Vital signs in last 24 hours: Temp:  [98 F (36.7 C)-98.6 F (37 C)] 98.3 F (36.8 C) (08/07 0758) Pulse Rate:  [71-111] 85 (08/07 1100) Resp:  [10-36] 22 (08/07 1100) BP: (112-175)/(52-100) 157/80 (08/07 1100) SpO2:  [96 %-100 %] 100 % (08/07 1100) Weight:  [83.3 kg] 83.3 kg (08/07 0500) Last BM Date : 02/24/24  Intake/Output from previous day: 08/06 0701 - 08/07 0700 In: 1422.6 [I.V.:594.1; IV Piggyback:828.6] Out: 1150 [Urine:1050; Drains:100] Intake/Output this shift: Total I/O In: 310 [P.O.:300; I.V.:10] Out: 100 [Urine:100]  Physical exam:  Chronically ill Jai:dnqu , ostomy w/o output. Midline wound w dressing and penrose in place, some serosanguinous staining of honeycomb, some brighter red, no evidence of active bleeding. JP serous. Alert, and well spoken, motivated to pursue mobility. Maurice in room, and engaged in care.   Lab Results: CBC  Recent Labs    02/25/24 0341 02/26/24 0438  WBC 3.4* 20.8*  HGB 8.9* 9.2*  HCT 28.0* 28.1*  PLT 117* 107*   BMET Recent Labs    02/25/24 0341 02/26/24 0438  NA 142 141  K 3.6 3.5  CL 113* 111  CO2 17* 22  GLUCOSE 153* 86  BUN 19 22  CREATININE 1.06* 0.95  CALCIUM  7.2* 8.0*   PT/INR No results for input(s): LABPROT, INR in the last 72 hours.  ABG Recent Labs    02/23/24 2022  HCO3 17.6*    Culture MODERATE STAPHYLOCOCCUS AUREUS ABUNDANT ENTEROCOCCUS FAECIUM SUSCEPTIBILITIES TO FOLLOW    Studies/Results: DG Chest Port 1 View Result Date: 02/25/2024 CLINICAL DATA:  Dyspnea. EXAM: PORTABLE CHEST 1 VIEW COMPARISON:  A 12/09/2023. FINDINGS: Low lung volumes. Stable cardiomediastinal contours. Unchanged left chest port tip overlies the brachiocephalic vein confluence. Similar  bilateral central perihilar interstitial prominence could reflect bronchovascular crowding secondary to hypoinflation or pulmonary vascular congestion. No focal consolidation, sizeable pleural effusion, or pneumothorax. Degenerative changes of the bilateral glenohumeral joints. No acute osseous abnormality. IMPRESSION: Similar bilateral central perihilar interstitial prominence could reflect bronchovascular crowding secondary to hypoinflation or pulmonary vascular congestion. Electronically Signed   By: Harrietta Sherry M.D.   On: 02/25/2024 17:03    Anti-infectives: Anti-infectives (From admission, onward)    Start     Dose/Rate Route Frequency Ordered Stop   02/24/24 1200  vancomycin  (VANCOREADY) IVPB 750 mg/150 mL        750 mg 150 mL/hr over 60 Minutes Intravenous Every 24 hours 02/23/24 1548     02/24/24 1015  cefoTEtan  (CEFOTAN ) 2 g in sodium chloride  0.9 % 100 mL IVPB        2 g 200 mL/hr over 30 Minutes Intravenous  Once 02/24/24 0926 02/24/24 1144   02/23/24 2200  piperacillin -tazobactam (ZOSYN ) IVPB 3.375 g        3.375 g 12.5 mL/hr over 240 Minutes Intravenous Every 8 hours 02/23/24 1548     02/23/24 1230  vancomycin  (VANCOREADY) IVPB 1750 mg/350 mL        1,750 mg 175 mL/hr over 120 Minutes Intravenous  Once 02/23/24 1215 02/23/24 1457   02/23/24 1100  piperacillin -tazobactam (ZOSYN ) IVPB 3.375 g        3.375 g 100 mL/hr over 30 Minutes Intravenous  Once 02/23/24 1055 02/23/24 1159  Assessment/Plan: Ileus following hartmann's, tolerating CLD w/o ostomy output so far...  Mobilize Continue a/bs, on Vancomycin  (nasal swab + for MRSA)  and Zosyn .  Ostomy nurse f/u appreciated.    Honor Leghorn, M.D., [facility]  02/26/2024 ; 11:11 AM

## 2024-02-27 ENCOUNTER — Other Ambulatory Visit

## 2024-02-27 DIAGNOSIS — A419 Sepsis, unspecified organism: Secondary | ICD-10-CM | POA: Diagnosis not present

## 2024-02-27 DIAGNOSIS — R6520 Severe sepsis without septic shock: Secondary | ICD-10-CM | POA: Diagnosis not present

## 2024-02-27 LAB — GLUCOSE, CAPILLARY
Glucose-Capillary: 96 mg/dL (ref 70–99)
Glucose-Capillary: 81 mg/dL (ref 70–99)
Glucose-Capillary: 108 mg/dL — ABNORMAL HIGH (ref 70–99)
Glucose-Capillary: 87 mg/dL (ref 70–99)
Glucose-Capillary: 84 mg/dL (ref 70–99)
Glucose-Capillary: 112 mg/dL — ABNORMAL HIGH (ref 70–99)
Glucose-Capillary: 143 mg/dL — ABNORMAL HIGH (ref 70–99)

## 2024-02-27 LAB — BASIC METABOLIC PANEL WITH GFR
Anion gap: 8 (ref 5–15)
BUN: 20 mg/dL (ref 8–23)
CO2: 21 mmol/L — ABNORMAL LOW (ref 22–32)
Calcium: 8.7 mg/dL — ABNORMAL LOW (ref 8.9–10.3)
Chloride: 113 mmol/L — ABNORMAL HIGH (ref 98–111)
Creatinine, Ser: 0.86 mg/dL (ref 0.44–1.00)
GFR, Estimated: >60 mL/min (ref >60)
Glucose, Bld: 96 mg/dL (ref 70–99)
Potassium: 3.3 mmol/L — ABNORMAL LOW (ref 3.5–5.1)
Sodium: 142 mmol/L (ref 135–145)

## 2024-02-27 LAB — CBC
HCT: 27.7 % — ABNORMAL LOW (ref 36.0–46.0)
Hemoglobin: 9.1 g/dL — ABNORMAL LOW (ref 12.0–15.0)
MCH: 31.4 pg (ref 26.0–34.0)
MCHC: 32.9 g/dL (ref 30.0–36.0)
MCV: 95.5 fL (ref 80.0–100.0)
Platelets: 121 K/uL — ABNORMAL LOW (ref 150–400)
RBC: 2.9 MIL/uL — ABNORMAL LOW (ref 3.87–5.11)
RDW: 16.9 % — ABNORMAL HIGH (ref 11.5–15.5)
WBC: 36.6 K/uL — ABNORMAL HIGH (ref 4.0–10.5)
nRBC: 0.7 % — ABNORMAL HIGH (ref 0.0–0.2)

## 2024-02-27 LAB — PHOSPHORUS: Phosphorus: 2.3 mg/dL — ABNORMAL LOW (ref 2.5–4.6)

## 2024-02-27 LAB — VANCOMYCIN, PEAK: Vancomycin Pk: 21 ug/mL — ABNORMAL LOW (ref 30–40)

## 2024-02-27 LAB — MAGNESIUM: Magnesium: 1.9 mg/dL (ref 1.7–2.4)

## 2024-02-27 MED ORDER — POTASSIUM CHLORIDE CRYS ER 20 MEQ PO TBCR
40.0000 meq | EXTENDED_RELEASE_TABLET | Freq: Once | ORAL | Status: AC
  Administered 2024-02-27: 40 meq via ORAL
  Filled 2024-02-27: qty 2

## 2024-02-27 MED ORDER — POTASSIUM & SODIUM PHOSPHATES 280-160-250 MG PO PACK
1.0000 | PACK | Freq: Three times a day (TID) | ORAL | Status: AC
  Administered 2024-02-27 – 2024-02-28 (×4): 1 via ORAL
  Filled 2024-02-27 (×2): qty 1
  Filled 2024-02-27: qty 2
  Filled 2024-02-27: qty 1

## 2024-02-27 NOTE — TOC Initial Note (Signed)
 Transition of Care [facility]) - Initial/Assessment Note    Patient Details  Name: [name] MRN: [id number] Date of Birth: 06/14/1946  [facility]) CM/SW Contact:    Mallory ONEIDA Haddock, RN Phone Number: 02/27/2024, 4:53 PM  Clinical Narrative:                    Admitted for: Severe sepsis secondary to diverticulitis  Admitted from: home with son PCP: Mallory  Current home health/prior home health/DME:o2 through [employer], shower seat and cane  Patient with new ostomy Therapy recommending SNF.  Patient states that she feels that she will need to go to SNF at discharge.  Patient states that she is currently receiving Chemo, with plans to have radiation.   Notified patient that in order to be able to find a SNF bed, chemo/radiation would have to be placed on hold   Message sent to MD and surgery to determine if patient will require chemo/radiation at discharge       Patient Goals and CMS Choice            Expected Discharge Plan and Services                                              Prior Living Arrangements/Services                       Activities of Daily Living   ADL Screening (condition at time of admission) Independently performs ADLs?: No Does the patient have a NEW difficulty with bathing/dressing/toileting/self-feeding that is expected to last >3 days?: No Does the patient have a NEW difficulty with getting in/out of bed, walking, or climbing stairs that is expected to last >3 days?: No Does the patient have a NEW difficulty with communication that is expected to last >3 days?: No Is the patient deaf or have difficulty hearing?: No Does the patient have difficulty seeing, even when wearing glasses/contacts?: No Does the patient have difficulty concentrating, remembering, or making decisions?: No  Permission Sought/Granted                  Emotional Assessment              Admission diagnosis:  Diverticulitis  [K57.92] Severe sepsis (HCC) [A41.9, R65.20] Patient Active Problem List   Diagnosis Date Noted   Severe sepsis (HCC) 02/23/2024   Requires daily assistance for activities of daily living (ADL) and comfort needs 02/17/2024   Encounter for support and coordination of transition of care 02/17/2024   Diverticulitis 02/03/2024   Acute diverticulitis 01/25/2024   Invasive ductal carcinoma of breast, female, right (HCC) 10/22/2023   BRCA2 gene mutation positive 10/13/2023   Genetic testing 10/10/2023   Breast cancer of lower-inner quadrant of right female breast (HCC) 09/16/2023   Depression, major, single episode, severe (HCC) 08/25/2023   Complicated grief 05/12/2023   Insomnia due to anxiety and fear 02/01/2023   Decreased hearing 12/15/2022   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   Thrombocytopenia (HCC) 12/03/2022   Hypokalemia 12/01/2022   Hypomagnesemia 12/01/2022   Transient alteration of awareness 12/01/2022   Bilateral lower extremity edema 10/18/2022   Fatigue due to excessive exertion 06/25/2022   Reduced vision 06/18/2021   Osteopenia 06/17/2021   Upper airway cough syndrome 06/13/2021   Chronic respiratory failure with hypoxia (HCC) 01/29/2021  DOE (dyspnea on exertion) 01/29/2021   Bilateral shoulder pain 08/14/2020   Trigger finger, left little finger 06/09/2019   Alteration in skin integrity due to moisture 03/30/2019   Chronic right shoulder pain 07/05/2018   Intermittent knee pain 05/28/2018   Vitamin D  deficiency 11/22/2015   Back pain 09/15/2013   Dermatomycosis 12/22/2012   Carpal tunnel syndrome on left 07/31/2011   Prediabetes 10/16/2007   Hyperlipidemia LDL goal <100 10/16/2007   Morbid obesity due to excess calories (HCC) complicated by hbp/hyperlipidemia 10/16/2007   Anxiety and depression 10/16/2007   Essential hypertension 10/16/2007   Pulmonary hypertension (HCC) 10/16/2007   GERD 10/16/2007   DIVERTICULOSIS OF COLON 10/16/2007    OSTEOARTHRITIS, KNEES, 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 [place] [address] Phone: [phone] Fax: [phone]  [facility]) - [place], [place] - [address] [facility] [address] [place] [address] [place] [address] Phone: [phone] Fax: [phone]  [facility] - [place], [place] - [address] [place] [address] Phone: [phone] Fax: [phone]  [id number] - [place], [place] - [address] [facility] [address] [place] [address] Phone: [phone] Fax: [phone]  [id number] - [place], [place] - [address] [place] [address] Phone: [phone] Fax: [phone]  [facility] [address] [place] [address] Phone: [phone] Fax: [phone]     Social Drivers of Health (SDOH) Social History: SDOH Screenings   Food Insecurity: No Food Insecurity (02/23/2024)  Housing: Low Risk  (02/23/2024)  Transportation Needs: No Transportation Needs (02/23/2024)  Utilities: Not At Risk (02/23/2024)  Alcohol Screen: Low Risk  (08/12/2023)  Depression (PHQ2-9): Low Risk  (02/17/2024)  Financial Resource Strain: Low Risk  (08/12/2023)  Physical Activity: Sufficiently Active (08/12/2023)  Social Connections: Unknown (02/23/2024)  Stress: No Stress Concern Present (08/12/2023)  Tobacco Use: Low Risk  (02/24/2024)  Health Literacy: Adequate Health Literacy (08/12/2023)   SDOH Interventions:     Readmission Risk Interventions     No data to display

## 2024-02-27 NOTE — Progress Notes (Signed)
 Physical Therapy Treatment Patient Details Name: BREON DISS MRN: [id number] DOB: [birth year] Today's Date: 02/27/2024   History of Present Illness Patient is a 78 year old female with diverticulitis with worsening abdominal pain. S/p ex-lap with colostomy. PMH: breast cancer currently undergoing treatment, HLD, HTN    PT Comments  Patient agreeable to PT session. She is more fatigued today compared to yesterday. Ambulation distance was shorter and patient needing seated rest break due to fatigue. Sp02 100% on 2 L02. Encouraged patient to sit up in chair for upright conditioning. Recommend to continue PT to maximize independence and decrease caregiver burden. Rehabilitation < 3 hours/day recommended after this hospital stay.    If plan is discharge home, recommend the following: A little help with walking and/or transfers;A little help with bathing/dressing/bathroom;Assist for transportation;Assistance with cooking/housework   Can travel by private vehicle     No  Equipment Recommendations  Rolling walker (2 wheels)    Recommendations for Other Services       Precautions / Restrictions Precautions Precautions: Fall Recall of Precautions/Restrictions: Intact Restrictions Weight Bearing Restrictions Per Provider Order: No     Mobility  Bed Mobility Overal bed mobility: Needs Assistance Bed Mobility: Rolling, Sidelying to Sit Rolling: Mod assist Sidelying to sit: Min assist       General bed mobility comments: cues for technique. increased time and effort required    Transfers Overall transfer level: Needs assistance Equipment used: Rolling walker (2 wheels) Transfers: Sit to/from Stand Sit to Stand: Min assist           General transfer comment: lifting assistance required for standing. cues for hand placement for safety    Ambulation/Gait Ambulation/Gait assistance: Contact guard assist Gait Distance (Feet): 5 Feet Assistive device: Rolling walker (2  wheels) Gait Pattern/deviations: Step-to pattern Gait velocity: decreased     General Gait Details: activity tolerance limited by fatigue today   Stairs             Wheelchair Mobility     Tilt Bed    Modified Rankin (Stroke Patients Only)       Balance Overall balance assessment: Needs assistance Sitting-balance support: Feet supported Sitting balance-Leahy Scale: Good     Standing balance support: Bilateral upper extremity supported, Reliant on assistive device for balance Standing balance-Leahy Scale: Poor Standing balance comment: heavy reliance on rolling walker for support                            Communication Communication Communication: No apparent difficulties  Cognition Arousal: Alert Behavior During Therapy: WFL for tasks assessed/performed   PT - Cognitive impairments: No apparent impairments                         Following commands: Intact      Cueing Cueing Techniques: Verbal cues  Exercises      General Comments General comments (skin integrity, edema, etc.): Sp02 100% on 2 L02. patient more fatigued with activity today compared to prior session      Pertinent Vitals/Pain Pain Assessment Pain Assessment: Faces Faces Pain Scale: Hurts a little bit Pain Location: abdomen Pain Descriptors / Indicators: Discomfort Pain Intervention(s): Limited activity within patient's tolerance, Monitored during session, Repositioned    Home Living                          Prior Function  PT Goals (current goals can now be found in the care plan section) Acute Rehab PT Goals Patient Stated Goal: rehab then home with son PT Goal Formulation: With patient Time For Goal Achievement: 03/11/24 Potential to Achieve Goals: Good Progress towards PT goals: Progressing toward goals    Frequency    Min 2X/week      PT Plan      Co-evaluation              AM-PAC PT 6 Clicks Mobility    Outcome Measure  Help needed turning from your back to your side while in a flat bed without using bedrails?: A Little Help needed moving from lying on your back to sitting on the side of a flat bed without using bedrails?: A Little Help needed moving to and from a bed to a chair (including a wheelchair)?: A Little Help needed standing up from a chair using your arms (e.g., wheelchair or bedside chair)?: A Little Help needed to walk in hospital room?: A Little Help needed climbing 3-5 steps with a railing? : A Lot 6 Click Score: 17    End of Session   Activity Tolerance: Patient limited by fatigue Patient left: in chair;with call bell/phone within reach;with chair alarm set;with family/visitor present (MD also in the room) Nurse Communication: Mobility status PT Visit Diagnosis: Muscle weakness (generalized) (M62.81);Unsteadiness on feet (R26.81)     Time: 9147-9090 PT Time Calculation (min) (ACUTE ONLY): 17 min  Charges:    $Therapeutic Activity: 8-22 mins PT General Charges $$ ACUTE PT VISIT: 1 Visit                     [name], PT, MPT   [name] 02/27/2024, 10:37 AM

## 2024-02-27 NOTE — Progress Notes (Signed)
 Nutrition Follow Up Note   DOCUMENTATION CODES:   Obesity unspecified  INTERVENTION:   Recommend TPN initiation if unable to advance pt's diet within the next 24 hrs  Boost Breeze po TID, each supplement provides 250 kcal and 9 grams of protein  MVI po daily   Pt at high refeed risk; recommend monitor potassium, magnesium  and phosphorus labs daily until stable  Daily weights   NUTRITION DIAGNOSIS:   Inadequate oral intake related to acute illness as evidenced by NPO status. -ongoing   GOAL:   Patient will meet greater than or equal to 90% of their needs -not met   MONITOR:   PO intake, Supplement acceptance, Labs, Weight trends, Skin, I & O's  ASSESSMENT:   78 y/o female with h/o breast cancer s/p lumpectomy on chemotherapy, anxiety, depression, pulmonary hypertension, GERD, OSA and recent admssion for diverticulitis who is admitted for recurrent complicated diverticulitis (now s/p Hartmann's procedure, takedown of splenic flexure, repair of ventral hernia & appendectomy 8/5), sepsis and shock.  Pt continues on the clear liquid diet. Pt with ongoing poor appetite and oral intake. Pt is drinking some Boost Breeze. Pt has remained on NPO/clear liquid diet since admission and is now without adequate nutrition for 5 days. Recommend TPN initiation if patient is unable to advance her diet within the next 24 hrs; this was discussed with medical team. Pt is refeeding; electrolytes being monitored and supplements. Per chart, pt is up ~9lbs since admission. Pt +8.9L on her I & Os. No bowel function yet.   Medications reviewed and include: heparin , insulin , melatonin, MVI, zosyn , vancomycin    Labs reviewed: K 3.3(L), P 2.3(L), Mg 1.9 wnl Wbc- 36.6(H), Hgb 9.1(L), Hct 27.7(L) Cbgs- 108, 81, 96 x 24 hrs   Drains- 50ml   UOP- 1,130ml   Diet Order:   Diet Order             Diet clear liquid Room service appropriate? Yes; Fluid consistency: Thin  Diet effective now                   EDUCATION NEEDS:   Not appropriate for education at this time  Skin:  Skin Assessment: Reviewed RN Assessment (incision abdomen)  Last BM:  8/5- type 6  Height:   Ht Readings from Last 1 Encounters:  02/24/24 5' (1.524 m)    Weight:   Wt Readings from Last 1 Encounters:  02/27/24 83.4 kg    Ideal Body Weight:  45.45 kg  BMI:  Body mass index is 35.91 kg/m.  Estimated Nutritional Needs:   Kcal:  1700-2000kcal/day  Protein:  85-100g/day  Fluid:  1.4-1.6L/day  Augustin Shams MS, RD, LDN If unable to be reached, please send secure chat to RD inpatient available from 8:00a-4:00p daily

## 2024-02-27 NOTE — Patient Instructions (Signed)
 Visit Information  Thank you for taking time to visit with me today. Please don't hesitate to contact me if I can be of assistance to you before our next scheduled appointment.  Our next appointment is by telephone on 03/09/24 at 1:30pm Please call the care guide team at [phone] if you need to cancel or reschedule your appointment.   Following is a copy of your care plan:   Goals Addressed             This Visit's Progress    BSW VBCI Social Work Care Plan       Problems:   Personal Care  CSW Clinical Goal(s):   Over the next 10 days the Patient will will follow up with Medicaid, Home Health Agencies, Cancer Center Social Worker as directed by Social Work.  Interventions:  Social Determinants of Health in Patient with Anxiety, Depression, HTN, and Cancer: SDOH assessments completed: Personal Care Evaluation of current treatment plan related to unmet needs SW referred patient son to connect with Sw at [facility] for other resources.  SW referred patient son to apply for Medicaid.  SW emailed a Educational psychologist agencies.  Patient Goals/Self-Care Activities:  Patient son agreed to contact Medicaid, Home Health agencies and Cancer Center SW for assistance.  Plan:   Telephone follow up appointment with care management team member scheduled for:  03/09/24 at 1:30pm        Please call 911 if you are experiencing a Mental Health or Behavioral Health Crisis or need someone to talk to.  Patient verbalizes understanding of instructions and care plan provided today and agrees to view in MyChart. Active MyChart status and patient understanding of how to access instructions and care plan via MyChart confirmed with patient.     Tillman Gardener, BSW [place]  [facility], [facility] Social Worker Direct Dial: [phone]  Fax: [phone] Website: [email or web address]

## 2024-02-27 NOTE — Plan of Care (Signed)

## 2024-02-27 NOTE — Consult Note (Signed)
 WOC Nurse ostomy consult note Stoma type/location:  LLQ, end colostomy  Stomal assessment/size: 1 3/4 round, budded, pink, moist  Peristomal assessment: in abdominal crease; dips in topography at 3 and 9 o'clock  Noted leakage at 3 o'clock Treatment options for stomal/peristomal skin: 1 whole barrier ring used around stoma (after lifting abdomen up to make taunt). 1/2 of barrier ring used from 12-6 o'clock again lifting to make as flat as possible.  Output liquid; brown stool  Ostomy pouching: 1pc.soft convex with 1.5 ostomy barrier ring  Education provided:   Explained role of ostomy nurse and creation of stoma  Explained stoma characteristics (budded, flush, color, texture, care) Demonstrated pouch change (cutting new skin barrier, measuring stoma, cleaning peristomal skin and stoma, use of barrier ring) Education on emptying when 1/3 to 1/2 full and how to empty Demonstrated use of wick to clean spout  Discussed need for support with ostomy care at home, with her abdominal topography the stoma will be hard for the patient to see until she can stand in a mirror to perform. Permission given to reach out to daughter in law about teaching session next week.  Rationale for adding belt discussed.  Discussed rationale for using soft  convexity and barrier rings.   Patient is talkative and engaged to learn  Enrolled patient in [employer] DC program: Yes; patient lives with son and daughter in Social worker. Her home is in [facility]  5 soft convex ordered; barrier rings in the room; pattern for soft convex in the room as well.   Left 2 1pc flat just in case for the weekend.   WOC Nurse will follow along with you for continued support with ostomy teaching and care Tashera Montalvo [facility] MSN, RN, [place], CNS, [employer] [phone]

## 2024-02-27 NOTE — Progress Notes (Signed)
 Progress Note   Patient: [name] FMW:[id number] DOB: 07-27-1945 DOA: 02/23/2024     4 DOS: the patient was seen and examined on 02/27/2024   Brief hospital course:  [name] is a 78 y.o. female with medical history significant of breast cancer (currently undergoing treatment), HLD, HTN who presented from home with complaint of abdominal pain.  She was recently discharged from here on 7/15 after she was admitted for acute diverticulitis and was managed with antibiotics.    ED course: afebrile, in sinus tachycardia.  Blood pressure was soft on presentation but she responded to IV fluid. potassium of 3.2, creatinine of 1.26, WC count of 0.5, hemoglobin of 8.7, platelet of 76, lactate of 4. Chest x-ray did not show any acute findings.  CT Abdomen/Pelvis showed moderate inflammation surrounding the sigmoid, most consistent with diverticulitis.  Patient started on broad-spectrum antibiotics. Blood culture sent.General surgery consulted   Patient was admitted to medicine service for further workup and management of sepsis as outlined in detail below.   02/24/24 -feels improved on my exam after receiving breathing treatment and pain medication. Had worsening abdominal pain and tachypnea this am. General surgery planning to bring to OR for ex-lap and colostomy placement.    I assumed care on 02/25/2024, post-op Day 1. Further hospital course and management as outlined below.   Assessment and Plan:  Severe sepsis secondary to diverticulitis: general surgery following, appreciate your care. Repeat abdominal imaging this am is pending read. 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  Pt afebrile. Thrombocytopenia- platelets 76>78... 99>>117.  Given 1 unit platelets preoperatively 8/5  Macrocytic anemia- Hgb 8.7>7.7. This is most likely secondary to her malignancy, chemotherapy. Status post 1u pRBCs transfused preoperatively on 8/5 --Continue monitor CBC  Tachycardia, tachypnea -chest x-ray and BNP to evaluate. 8/6 - BNP elevated 312.3 and CXR showing low lung volumes, similar pulmonary vascular congestion in perihilar region.  RR in 30's. 8/7 -- vitals improved today, RR only intermittently in low 20's Clinically suspect patient getting volume overloaded.   While I/O's are likely inaccurate, weight is up and positive fluid balance over 8 L since admission. --Hold IV fluids --Given Lasix  20 mg IV x 1 on 8/6 afternoon --Further diuresis if needed    AKI: Resolved  -- Monitor BMP -- Stop IV fluids as above  Hypokalemia: Recurrent with K 3.3 today --40 mEq PO K-Cl to replace -- Monitor BMP and replace K as needed  Hypomagnesemia -mag 1.4 was replaced on 8/6. Resolved --Monitor Mg level & replace PRN  Hypophosphatemia - phos 2.3 --Oral phos-NaK to replace --Monitor phos level   History of hypertension: Hypotensive on presentation which resolved with IV fluids.  Antihypertensives were held which may have caused reflexive tachycardia. She has been in sinus rhythm around 110-120 overnight. Denies flutter, chest pain --Resumed on home metoprolol   --Telemetry monitoring --Titrate regimen   Hyperlipidemia:  --Statin currently on hold    Obesity Body mass index is 35.13 kg/m. Complicates overall care and prognosis.  Recommend lifestyle modifications including physical activity and diet for weight loss and overall long-term health.       Subjective: Patient up in recliner on AM  rounds today. She reports abdominal pain and requesting the chair be pulled out from the wall so she can recline.  She  otherwise denies complaints including dyspnea or fever/chills.   Physical Exam: Vitals:   02/26/24 1945 02/27/24 0600 02/27/24 0737 02/27/24 0821  BP: 128/66 (!) 135/52  (!) 190/87  Pulse: 84   80  Resp: 17   (!) 22  Temp: 97.8 F (36.6 C) 98.6 F (37 C)  98.3 F (36.8 C)  TempSrc:  Oral    SpO2: 100% 100%  100%  Weight:   83.4 kg   Height:       General exam: awake, alert, no acute distress, chronically ill-appearing HEENT: moist mucus membranes, hearing grossly normal  Respiratory system: CTAB diminished bases bilaterally, no expiratory wheezes, no rhonchi, normal respiratory effort at rest on 2 L/min [place] O2 with no accessory muscle use. Cardiovascular system: normal S1/S2, RRR  Gastrointestinal system: soft, midline incision with occlusive honeycomb dressing intact, colostomy with liquid brown stool in bag Central nervous system: A&O x 3. no gross focal neurologic deficits, normal speech Psychiatry: normal mood, congruent affect, judgement and insight appear normal   Data Reviewed:  Notable labs --  K 3.3 Cl 113 Bicarb 21 Ca 8.7 Phos 2.3  WBC 3.4 >> 20.8 >> 36.6 Hemoglobin 8.9 >> 9.2 >> 9.1 stable Platelets 117 >> 107 >> 121    Family Communication: None present on rounds, will attempt to call as time allows  Disposition: Status is: Inpatient Remains inpatient appropriate because: Severity of illness as outlined above, on IV therapies   Planned Discharge Destination: Skilled nursing facility    Time spent: 36 minutes  Author: Burnard DELENA Cunning, DO 02/27/2024 11:29 AM  For on call review [email or web address].

## 2024-02-27 NOTE — Patient Outreach (Signed)
 Complex Care Management   Visit Note  02/27/2024  Name:  [name] MRN: [id number] DOB: April 16, 1946  Situation: Referral received for Complex Care Management related to SDOH Barriers:  Personal Care I obtained verbal consent from Caregiver.  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 Son wants to make a plan for care when patient returns home.  Son and his wife work and have children and need extra help with caregiving.  Patients insurance may provider OT/PT/Bath nurse after discharge, but provider would need to complete orders.  SW referred patient son to connect with Sw at [facility] for other resources. SW referred patient son to apply for Medicaid and will email the link to apply online. Son is willing to pay out of pocket for personal care but needs affordable options.  SW emailed a Educational psychologist agencies.   SDOH Interventions    Flowsheet Row Care Coordination from 08/12/2023 in [facility] Community Care Coordination Care Coordination from 05/22/2023 in [facility] Community Care Coordination Care Coordination from 01/07/2023 in [facility] Community Care Coordination Clinical Support from 05/02/2021 in [facility] Primary [facility] Video Visit from 05/01/2020 in [facility] Primary Care  SDOH Interventions       Food Insecurity Interventions Intervention Not Indicated Intervention Not Indicated Intervention Not Indicated Intervention Not Indicated Intervention Not Indicated   Housing Interventions Intervention Not Indicated Intervention Not Indicated Intervention Not Indicated Intervention Not Indicated Intervention Not Indicated  Transportation Interventions Intervention Not Indicated, Patient Resources (Friends/Family) Intervention Not Indicated, Patient Resources (Friends/Family), Payor Benefit --  [Patient recently has been restricted from driving, hardship on son/daughter-in-law to take patient to md apt in the future] Intervention Not Indicated Intervention Not Indicated  Utilities Interventions Intervention Not Indicated Intervention Not Indicated Intervention Not Indicated -- --  Alcohol Usage Interventions Intervention Not Indicated (Score <7) Intervention Not Indicated (Score <7) -- -- --  Depression Interventions/Treatment  Medication, Counseling, Currently on Treatment, Community Resources Provided Referral to Psychiatry, Medication, Counseling -- -- --  Financial Strain Interventions Intervention Not Indicated Intervention Not Indicated -- Intervention Not Indicated Intervention Not Indicated  Physical Activity Interventions Intervention Not Indicated, Community Resources Provided Intervention Not Indicated -- Intervention Not Indicated Intervention Not Indicated  Stress Interventions Intervention Not Indicated, Offered [employer] Resources, [employer] Provided, Provide Counseling Intervention Not Indicated -- Intervention Not Indicated Intervention Not Indicated  Social Connections Interventions Intervention Not Indicated, Community Resources Provided Intervention Not Indicated -- Intervention Not Indicated Intervention Not Indicated  Health Literacy Interventions Intervention Not Indicated Intervention Not Indicated -- -- --      Recommendation:   None  Follow Up Plan:   Telephone follow up appointment date/time:  03/09/24 at 1:30pm  Tillman Gardener, BSW [place]  [facility], [facility] Social  Worker Direct Dial: [phone]  Fax: [phone] Website: [email or web address]

## 2024-02-27 NOTE — Progress Notes (Signed)
 CC: POD # 3 Subjective: Up in chair, more upright.  Wbc UP to 36K as expected, s/p Granix , and pl good, considering.  Uo good, Cr better Denies n/v, reports little appetite thus far...   Objective: Vital signs in last 24 hours: Temp:  [97.8 F (36.6 C)-98.6 F (37 C)] 98.3 F (36.8 C) (08/08 0821) Pulse Rate:  [69-89] 80 (08/08 0821) Resp:  [17-26] 22 (08/08 0821) BP: (92-190)/(52-100) 190/87 (08/08 0821) SpO2:  [99 %-100 %] 100 % (08/08 0821) Last BM Date : 02/24/24  Intake/Output from previous day: 08/07 0701 - 08/08 0700 In: 1445 [P.O.:500; I.V.:10; IV Piggyback:150] Out: 1150 [Urine:1100; Drains:50] Intake/Output this shift: No intake/output data recorded.  Physical exam:  Chronically ill Jai:dnqu , ostomy w/ liquid stool output. Some flatus reported. Midline wound w dressing and penrose in place, some serosanguinous staining of honeycomb, now clearly old, no evidence of active bleeding. JP serous. Alert, and well spoken, motivated to pursue mobility. Family not present this AM.   Lab Results: CBC  Recent Labs    02/26/24 0438 02/27/24 0515  WBC 20.8* 36.6*  HGB 9.2* 9.1*  HCT 28.1* 27.7*  PLT 107* 121*   BMET Recent Labs    02/26/24 0438 02/27/24 0515  NA 141 142  K 3.5 3.3*  CL 111 113*  CO2 22 21*  GLUCOSE 86 96  BUN 22 20  CREATININE 0.95 0.86  CALCIUM  8.0* 8.7*   PT/INR No results for input(s): LABPROT, INR in the last 72 hours.  ABG No results for input(s): PHART, HCO3 in the last 72 hours.  Invalid input(s): PCO2, PO2   Culture MODERATE STAPHYLOCOCCUS AUREUS ABUNDANT ENTEROCOCCUS FAECIUM SUSCEPTIBILITIES TO FOLLOW    Studies/Results: DG Chest Port 1 View Result Date: 02/25/2024 CLINICAL DATA:  Dyspnea. EXAM: PORTABLE CHEST 1 VIEW COMPARISON:  A 12/09/2023. FINDINGS: Low lung volumes. Stable cardiomediastinal contours. Unchanged left chest port tip overlies the brachiocephalic vein confluence. Similar bilateral central  perihilar interstitial prominence could reflect bronchovascular crowding secondary to hypoinflation or pulmonary vascular congestion. No focal consolidation, sizeable pleural effusion, or pneumothorax. Degenerative changes of the bilateral glenohumeral joints. No acute osseous abnormality. IMPRESSION: Similar bilateral central perihilar interstitial prominence could reflect bronchovascular crowding secondary to hypoinflation or pulmonary vascular congestion. Electronically Signed   By: Harrietta Sherry M.D.   On: 02/25/2024 17:03    Anti-infectives: Anti-infectives (From admission, onward)    Start     Dose/Rate Route Frequency Ordered Stop   02/24/24 1200  vancomycin  (VANCOREADY) IVPB 750 mg/150 mL        750 mg 150 mL/hr over 60 Minutes Intravenous Every 24 hours 02/23/24 1548     02/24/24 1015  cefoTEtan  (CEFOTAN ) 2 g in sodium chloride  0.9 % 100 mL IVPB        2 g 200 mL/hr over 30 Minutes Intravenous  Once 02/24/24 0926 02/24/24 1144   02/23/24 2200  piperacillin -tazobactam (ZOSYN ) IVPB 3.375 g        3.375 g 12.5 mL/hr over 240 Minutes Intravenous Every 8 hours 02/23/24 1548     02/23/24 1230  vancomycin  (VANCOREADY) IVPB 1750 mg/350 mL        1,750 mg 175 mL/hr over 120 Minutes Intravenous  Once 02/23/24 1215 02/23/24 1457   02/23/24 1100  piperacillin -tazobactam (ZOSYN ) IVPB 3.375 g        3.375 g 100 mL/hr over 30 Minutes Intravenous  Once 02/23/24 1055 02/23/24 1159       Assessment/Plan: Ileus seems to be improving following  Hartmann's, will stay with CLD for today.  Boost supplements.  Mobilize K+ for hypokalemia.  Monitor lytes/CBC.  Continue a/bs, on Vancomycin  (nasal swab + for MRSA)  and Zosyn .  Ostomy nurse f/u appreciated.   Appreciate primary svc care.    Honor Leghorn, M.D., [facility]  02/27/2024 ; 9:02 AM

## 2024-02-28 DIAGNOSIS — R6520 Severe sepsis without septic shock: Secondary | ICD-10-CM | POA: Diagnosis not present

## 2024-02-28 DIAGNOSIS — A419 Sepsis, unspecified organism: Secondary | ICD-10-CM | POA: Diagnosis not present

## 2024-02-28 LAB — VANCOMYCIN, TROUGH: Vancomycin Tr: 8 ug/mL — ABNORMAL LOW (ref 15–20)

## 2024-02-28 LAB — CULTURE, BLOOD (ROUTINE X 2)
Culture: NO GROWTH
Culture: NO GROWTH

## 2024-02-28 LAB — GLUCOSE, CAPILLARY
Glucose-Capillary: 95 mg/dL (ref 70–99)
Glucose-Capillary: 75 mg/dL (ref 70–99)
Glucose-Capillary: 107 mg/dL — ABNORMAL HIGH (ref 70–99)
Glucose-Capillary: 100 mg/dL — ABNORMAL HIGH (ref 70–99)
Glucose-Capillary: 126 mg/dL — ABNORMAL HIGH (ref 70–99)
Glucose-Capillary: 148 mg/dL — ABNORMAL HIGH (ref 70–99)

## 2024-02-28 LAB — BASIC METABOLIC PANEL WITH GFR
Anion gap: 8 (ref 5–15)
BUN: 19 mg/dL (ref 8–23)
CO2: 21 mmol/L — ABNORMAL LOW (ref 22–32)
Calcium: 9.1 mg/dL (ref 8.9–10.3)
Chloride: 113 mmol/L — ABNORMAL HIGH (ref 98–111)
Creatinine, Ser: 0.68 mg/dL (ref 0.44–1.00)
GFR, Estimated: >60 mL/min (ref >60)
Glucose, Bld: 93 mg/dL (ref 70–99)
Potassium: 4 mmol/L (ref 3.5–5.1)
Sodium: 142 mmol/L (ref 135–145)

## 2024-02-28 LAB — CBC
HCT: 28.3 % — ABNORMAL LOW (ref 36.0–46.0)
Hemoglobin: 9.2 g/dL — ABNORMAL LOW (ref 12.0–15.0)
MCH: 31.5 pg (ref 26.0–34.0)
MCHC: 32.5 g/dL (ref 30.0–36.0)
MCV: 96.9 fL (ref 80.0–100.0)
Platelets: 145 K/uL — ABNORMAL LOW (ref 150–400)
RBC: 2.92 MIL/uL — ABNORMAL LOW (ref 3.87–5.11)
RDW: 17.2 % — ABNORMAL HIGH (ref 11.5–15.5)
WBC: 35.5 K/uL — ABNORMAL HIGH (ref 4.0–10.5)
nRBC: 1.5 % — ABNORMAL HIGH (ref 0.0–0.2)

## 2024-02-28 LAB — MAGNESIUM: Magnesium: 1.8 mg/dL (ref 1.7–2.4)

## 2024-02-28 LAB — PHOSPHORUS: Phosphorus: 3.3 mg/dL (ref 2.5–4.6)

## 2024-02-28 MED ORDER — TEMAZEPAM 7.5 MG PO CAPS
15.0000 mg | ORAL_CAPSULE | Freq: Every evening | ORAL | Status: DC | PRN
  Administered 2024-03-01 (×2): 15 mg via ORAL
  Filled 2024-02-28: qty 2

## 2024-02-28 MED ORDER — LOSARTAN POTASSIUM 50 MG PO TABS
50.0000 mg | ORAL_TABLET | Freq: Every day | ORAL | Status: DC
  Administered 2024-02-28 – 2024-03-01 (×4): 50 mg via ORAL
  Filled 2024-02-28 (×3): qty 1

## 2024-02-28 MED ORDER — ATORVASTATIN CALCIUM 20 MG PO TABS
20.0000 mg | ORAL_TABLET | Freq: Every day | ORAL | Status: DC
  Administered 2024-02-28 – 2024-03-02 (×6): 20 mg via ORAL
  Filled 2024-02-28 (×4): qty 1

## 2024-02-28 MED ORDER — PANTOPRAZOLE SODIUM 40 MG PO TBEC
40.0000 mg | DELAYED_RELEASE_TABLET | Freq: Every day | ORAL | Status: DC
  Administered 2024-02-28 – 2024-03-02 (×5): 40 mg via ORAL
  Filled 2024-02-28 (×4): qty 1

## 2024-02-28 MED ORDER — VANCOMYCIN HCL IN DEXTROSE 1-5 GM/200ML-% IV SOLN
1000.0000 mg | INTRAVENOUS | Status: DC
  Administered 2024-02-29 – 2024-03-01 (×3): 1000 mg via INTRAVENOUS
  Filled 2024-02-28 (×3): qty 200

## 2024-02-28 NOTE — Progress Notes (Signed)
 CC: POD # 3 Subjective:  Wbc stable at 35.5 K as expected, s/p Granix , and plts increased, considering.  Uo adequate, Foley removed.  Denies n/v, reports little appetite thus far, tolerating clear liquids well.  Will advance to full liquid diet. More stool in bag, minimal flatus.  Objective: Vital signs in last 24 hours: Temp:  [97.7 F (36.5 C)-99 F (37.2 C)] 99 F (37.2 C) (08/09 0318) Pulse Rate:  [75-85] 82 (08/09 0918) Resp:  [16-20] 20 (08/09 0318) BP: (143-152)/(68-78) 145/71 (08/09 0918) SpO2:  [100 %] 100 % (08/09 0318) Weight:  [91.1 kg] 91.1 kg (08/09 0426) Last BM Date : 02/27/24  Intake/Output from previous day: 08/08 0701 - 08/09 0700 In: 925 [P.O.:900] Out: 1400 [Urine:700; Stool:700] Intake/Output this shift: No intake/output data recorded.  Physical exam:  Chronically ill [name] , ostomy w/ increased stool output. Some flatus noted. Midline wound w dressing and penrose in place, some serosanguinous staining of honeycomb, now clearly old, no evidence of active bleeding. JP serous. Alert, and well spoken, motivated to do what is needed. Family not present this AM.   Lab Results: CBC  Recent Labs    02/27/24 0515 02/28/24 0511  WBC 36.6* 35.5*  HGB 9.1* 9.2*  HCT 27.7* 28.3*  PLT 121* 145*   BMET Recent Labs    02/27/24 0515 02/28/24 0511  NA 142 142  K 3.3* 4.0  CL 113* 113*  CO2 21* 21*  GLUCOSE 96 93  BUN 20 19  CREATININE 0.86 0.68  CALCIUM  8.7* 9.1   PT/INR No results for input(s): LABPROT, INR in the last 72 hours.  ABG No results for input(s): PHART, HCO3 in the last 72 hours.  Invalid input(s): PCO2, PO2   Culture MODERATE STAPHYLOCOCCUS AUREUS ABUNDANT ENTEROCOCCUS FAECIUM SUSCEPTIBILITIES TO FOLLOW    Studies/Results: No results found.   Anti-infectives: Anti-infectives (From admission, onward)    Start     Dose/Rate Route Frequency Ordered Stop   02/24/24 1200  vancomycin  (VANCOREADY) IVPB 750  mg/150 mL        750 mg 150 mL/hr over 60 Minutes Intravenous Every 24 hours 02/23/24 1548     02/24/24 1015  cefoTEtan  (CEFOTAN ) 2 g in sodium chloride  0.9 % 100 mL IVPB        2 g 200 mL/hr over 30 Minutes Intravenous  Once 02/24/24 0926 02/24/24 1144   02/23/24 2200  piperacillin -tazobactam (ZOSYN ) IVPB 3.375 g        3.375 g 12.5 mL/hr over 240 Minutes Intravenous Every 8 hours 02/23/24 1548     02/23/24 1230  vancomycin  (VANCOREADY) IVPB 1750 mg/350 mL        1,750 mg 175 mL/hr over 120 Minutes Intravenous  Once 02/23/24 1215 02/23/24 1457   02/23/24 1100  piperacillin -tazobactam (ZOSYN ) IVPB 3.375 g        3.375 g 100 mL/hr over 30 Minutes Intravenous  Once 02/23/24 1055 02/23/24 1159       Assessment/Plan: Ileus seems to be improving following Hartmann's, will advance to full liquids.  Nutritional supplements.  Mobilize Monitor lytes/CBC.  Continue a/bs, on Vancomycin  (nasal swab + for MRSA)  and Zosyn .  Ostomy nurse f/u appreciated.   Appreciate primary svc care.    Honor Leghorn, M.D., [facility]  02/28/2024 ; 9:42 AM

## 2024-02-28 NOTE — Consult Note (Signed)
 Pharmacy Antibiotic Note  [name] is a 78 y.o. female admitted on 02/23/2024 with sepsis secondary to diverticulitis.  PMH of breast cancer on chemo. Pharmacy has been consulted for Vancomycin  dosing. Afeb, WBC 20 > 35.5. Pt is growing MRSA, enterococcus faecium both sensitive to vancomycin . S/p colectomy on 8/5.   Plan: Day 6 of abx. Pt was on vancomycin  750 mg q24H. Vancomycin  peak 21 and trough of 8. Calculated AUC of 342. Will adjust vancomycin  to 1000 mg q24H for a calculated AUC of 456. Plan to reorder vancomycin  level in 5 days or if clinically needed.   Continue Zosyn  3.375g Q8h.       Height: 5' (152.4 cm) Weight: 91.1 kg (200 lb 13.4 oz) IBW/kg (Calculated) : 45.5  Temp (24hrs), Avg:98.5 F (36.9 C), Min:97.7 F (36.5 C), Max:99 F (37.2 C)  Recent Labs  Lab 02/23/24 1058 02/23/24 1222 02/23/24 2022 02/23/24 2331 02/24/24 0416 02/24/24 1652 02/25/24 0341 02/26/24 0438 02/27/24 0515 02/27/24 1607 02/28/24 0511 02/28/24 1131  WBC 0.5*  --   --   --  1.0* 1.1* 3.4* 20.8* 36.6*  --  35.5*  --   CREATININE 1.26*  --   --   --  0.81  --  1.06* 0.95 0.86  --  0.68  --   LATICACIDVEN 4.0* 2.8* 3.3* 2.4* 2.0*  --   --   --   --   --   --   --   VANCOTROUGH  --   --   --   --   --   --   --   --   --   --   --  8*  VANCOPEAK  --   --   --   --   --   --   --   --   --  21*  --   --     Estimated Creatinine Clearance: 60.2 mL/min (by C-G formula based on SCr of 0.68 mg/dL).    No Known Allergies  Antimicrobials this admission: 02/23/24 vancomycin  >>  02/23/24 zosyn  >>   Dose adjustments this admission: 8/9: Vancomycin  750 mg > 1000 mg q24H  Microbiology results: 02/23/24 BCx: NGTD 02/23/24 UCx: ngtd   02/23/24 MRSA PCR: Detected 8/5 body fluid culture:  MODERATE METHICILLIN RESISTANT STAPHYLOCOCCUS AUREUS ABUNDANT ENTEROCOCCUS FAECIUM  Thank you for allowing pharmacy to be a part of this patient's care.  [name]- Pharmacy Student 02/28/2024 2:09 PM

## 2024-02-28 NOTE — Progress Notes (Addendum)
 Progress Note   Patient: [name] FMW:[id number] DOB: 02/13/1946 DOA: 02/23/2024     5 DOS: the patient was seen and examined on 02/28/2024   Brief hospital course:  [name] is a 78 y.o. female with medical history significant of breast cancer (currently undergoing treatment), HLD, HTN who presented from home with complaint of abdominal pain.  She was recently discharged from here on 7/15 after she was admitted for acute diverticulitis and was managed with antibiotics.    ED course: afebrile, in sinus tachycardia.  Blood pressure was soft on presentation but she responded to IV fluid. potassium of 3.2, creatinine of 1.26, WC count of 0.5, hemoglobin of 8.7, platelet of 76, lactate of 4. Chest x-ray did not show any acute findings.  CT Abdomen/Pelvis showed moderate inflammation surrounding the sigmoid, most consistent with diverticulitis.  Patient started on broad-spectrum antibiotics. Blood culture sent.General surgery consulted   Patient was admitted to medicine service for further workup and management of sepsis as outlined in detail below.   02/24/24 -feels improved on my exam after receiving breathing treatment and pain medication. Had worsening abdominal pain and tachypnea this am. General surgery planning to bring to OR for ex-lap and colostomy placement.    I assumed care on 02/25/2024, post-op Day 1. Further hospital course and management as outlined below.   Assessment and Plan:  Severe sepsis secondary to diverticulitis: general surgery following, appreciate your care. Repeat abdominal imaging this am is pending read. 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  Pt afebrile. Thrombocytopenia- platelets 76>78... 99>>117>>145.   Given 1 unit platelets preoperatively 8/5  Macrocytic anemia- Hgb 8.7>7.7. This is most likely secondary to her malignancy, chemotherapy. Status post 1u pRBCs transfused preoperatively on 8/5 --Continue monitor CBC  Tachycardia, tachypnea -chest x-ray and BNP to evaluate.  Suspect related to pain, mild volume overload after fluids around surgery.  Vitals have improved. 8/6 - BNP elevated 312.3 and CXR showing low lung volumes, similar pulmonary vascular congestion in perihilar region.  RR in 30's. 8/7 -- vitals improved today, RR only intermittently in low 20's Clinically suspect patient getting volume overloaded.   While I/O's are likely inaccurate, weight is up and positive fluid balance over 8 L since admission. --Hold IV fluids --Given Lasix  20 mg IV x 1 on 8/6 afternoon --Further diuresis if needed --Resume home Lasix  when clinically appropriate    AKI: Resolved  -- Monitor BMP -- Monitoring off IV fluids   Hypokalemia: resolved with replacement -- Monitor BMP and replace K as needed  Hypomagnesemia -mag 1.4 was replaced on 8/6. Resolved --Monitor Mg level & replace PRN  Hypophosphatemia - resolved with replacement --Monitor phos level   History of hypertension: Hypotensive on presentation which resolved with IV fluids.  Antihypertensives were held which may have caused reflexive tachycardia. She has been in sinus rhythm around 110-120 overnight. Denies flutter, chest pain --Continue home metoprolol   --Will resume losartan  at 50 mg daily (home dose 100 mg) --Home lasix  still held --Telemetry monitoring --Titrate regimen   Hyperlipidemia:  --Resume statin  GERD --Resume PPI  Insomnia --Resume home Restoril     Obesity Body mass index is 35.13 kg/m. Complicates  overall care and prognosis.  Recommend lifestyle modifications including  physical activity and diet for weight loss and overall long-term health.       Subjective: Patient awake resting in bed on rounds today. She reports overall feeling fair.  She reports feeling she has to strain to void, still with Foley in place from surgery. She is agreeable to remove it today.  No fever/chills.  She is understanding and agreeable that her radiation will need to be held while in rehab.  Reports frequent belching, and occasional hiccups  No other acute complaints. Hopes to get diet advanced today, many things on clear liquid diet are too sweet for her.   Physical Exam: Vitals:   02/27/24 1932 02/28/24 0318 02/28/24 0426 02/28/24 0918  BP: (!) 152/77 (!) 143/68  (!) 145/71  Pulse: 81 85  82  Resp: 16 20    Temp: 98.9 F (37.2 C) 99 F (37.2 C)    TempSrc: Oral Oral    SpO2: 100% 100%    Weight:   91.1 kg   Height:       General exam: awake, alert, no acute distress, chronically ill-appearing HEENT: moist mucus membranes, hearing grossly normal  Respiratory system: CTAB diminished bases bilaterally, no expiratory wheezes, no rhonchi, normal respiratory effort at rest on 2 L/min [place] O2 with no accessory muscle use. Cardiovascular system: normal S1/S2, RRR  Gastrointestinal system: soft, midline incision with occlusive honeycomb dressing intact, colostomy with liquid brown stool in bag Central nervous system: A&O x 3. no gross focal neurologic deficits, normal speech Psychiatry: normal mood, congruent affect, judgement and insight appear normal   Data Reviewed:  Notable labs --  Cl 113 Bicarb 21   WBC 20.8 >> 36.6 >> 35.5 Hemoglobin 8.9 >> 9.2 >> 9.1 >> 9.2 stable Platelets 117 >> 107 >> 121 >> 145 improving    Family Communication: None present on rounds, will attempt to call as time allows  Disposition: Status is: Inpatient Remains inpatient appropriate because: Severity of illness as outlined above, on IV therapies   Planned Discharge Destination:  Skilled nursing facility    Time spent: 36 minutes  Author: Burnard DELENA Cunning, DO 02/28/2024 12:46 PM  For on call review [email or web address].

## 2024-02-28 NOTE — Plan of Care (Signed)

## 2024-02-29 DIAGNOSIS — A419 Sepsis, unspecified organism: Secondary | ICD-10-CM | POA: Diagnosis not present

## 2024-02-29 DIAGNOSIS — R6520 Severe sepsis without septic shock: Secondary | ICD-10-CM | POA: Diagnosis not present

## 2024-02-29 LAB — BASIC METABOLIC PANEL WITH GFR
Anion gap: 7 (ref 5–15)
BUN: 12 mg/dL (ref 8–23)
CO2: 21 mmol/L — ABNORMAL LOW (ref 22–32)
Calcium: 8.8 mg/dL — ABNORMAL LOW (ref 8.9–10.3)
Chloride: 111 mmol/L (ref 98–111)
Creatinine, Ser: 0.53 mg/dL (ref 0.44–1.00)
GFR, Estimated: >60 mL/min (ref >60)
Glucose, Bld: 100 mg/dL — ABNORMAL HIGH (ref 70–99)
Potassium: 4 mmol/L (ref 3.5–5.1)
Sodium: 139 mmol/L (ref 135–145)

## 2024-02-29 LAB — GLUCOSE, CAPILLARY
Glucose-Capillary: 99 mg/dL (ref 70–99)
Glucose-Capillary: 86 mg/dL (ref 70–99)
Glucose-Capillary: 122 mg/dL — ABNORMAL HIGH (ref 70–99)
Glucose-Capillary: 131 mg/dL — ABNORMAL HIGH (ref 70–99)
Glucose-Capillary: 157 mg/dL — ABNORMAL HIGH (ref 70–99)

## 2024-02-29 LAB — CBC
HCT: 26.9 % — ABNORMAL LOW (ref 36.0–46.0)
Hemoglobin: 8.6 g/dL — ABNORMAL LOW (ref 12.0–15.0)
MCH: 31.6 pg (ref 26.0–34.0)
MCHC: 32 g/dL (ref 30.0–36.0)
MCV: 98.9 fL (ref 80.0–100.0)
Platelets: 155 K/uL (ref 150–400)
RBC: 2.72 MIL/uL — ABNORMAL LOW (ref 3.87–5.11)
RDW: 16.7 % — ABNORMAL HIGH (ref 11.5–15.5)
WBC: 31.4 K/uL — ABNORMAL HIGH (ref 4.0–10.5)
nRBC: 0.8 % — ABNORMAL HIGH (ref 0.0–0.2)

## 2024-02-29 LAB — AEROBIC/ANAEROBIC CULTURE W GRAM STAIN (SURGICAL/DEEP WOUND): Gram Stain: NONE SEEN

## 2024-02-29 LAB — PHOSPHORUS: Phosphorus: 4 mg/dL (ref 2.5–4.6)

## 2024-02-29 LAB — MAGNESIUM: Magnesium: 1.5 mg/dL — ABNORMAL LOW (ref 1.7–2.4)

## 2024-02-29 MED ORDER — MAGNESIUM SULFATE 2 GM/50ML IV SOLN
2.0000 g | Freq: Once | INTRAVENOUS | Status: AC
  Administered 2024-02-29: 2 g via INTRAVENOUS
  Filled 2024-02-29: qty 50

## 2024-02-29 NOTE — Plan of Care (Signed)

## 2024-02-29 NOTE — Progress Notes (Signed)
 Progress Note    [name]  FMW:[id number] DOB: [birth year]  DOA: 02/23/2024 PCP: Antonetta Rollene BRAVO, MD      Brief Narrative:    Medical records reviewed and are as summarized below:  [name] is a 78 y.o. female with medical history significant for breast cancer on treatment, hyperlipidemia, hypertension, who presented to the hospital with abdominal pain.  Was recently discharged from the hospital on 02/03/2024 after hospitalization for acute diverticulitis is now managed with antibiotics.  She was admitted to the hospital for severe sepsis secondary to acute diverticulitis.  She was treated with empiric IV antibiotics.        Assessment/Plan:   Principal Problem:   Severe sepsis (HCC) Active Problems:   Hyperlipidemia LDL goal <100   Essential hypertension   Acute diverticulitis   Nutrition Problem: Inadequate oral intake Etiology: acute illness   Body mass index is 41.03 kg/m.  (Morbid obesity)   Severe sepsis secondary to acute diverticulitis: S/p open Hartman's procedure, colostomy creation on 02/24/2024 Continue IV vancomycin  and Zosyn . Analgesics as needed for pain. Diet has been advanced to full liquid diet.  Follow-up with general surgeon.    Tachycardia, tachypnea: improved She is off of IV fluids. 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 S/p transfusion of 1 unit of platelets and management of PRBCs on 02/24/2024    Hypomagnesemia: Replete with IV magnesium  sulfate Hypokalemia: Improved Hypophosphatemia: Improved    Comorbidities include hyperlipidemia, hypertension, GERD, insomnia    Diet Order             Diet full liquid Room service appropriate? Yes; Fluid consistency: Thin  Diet effective now                                  Consultants: General Surgeon  Procedures: Open Hartman's procedure,  takedown splenic flexure, repair of ventral hernia, appendectomy on 02/24/2024    Medications:    acetaminophen   1,000 mg Oral Q6H   atorvastatin   20 mg Oral Daily   Chlorhexidine  Gluconate Cloth  6 each Topical Daily   feeding supplement  1 Container Oral TID BM   gabapentin   100 mg Oral QHS   heparin  injection (subcutaneous)  5,000 Units Subcutaneous Q12H   insulin  aspart  1-3 Units Subcutaneous Q4H   losartan   50 mg Oral Daily   melatonin  2.5 mg Oral QHS   metoprolol  succinate  50 mg Oral Daily   multivitamin with minerals  1 tablet Oral Daily   nystatin   5 mL Oral QID   pantoprazole   40 mg Oral Daily   sertraline   50 mg Oral Daily   sodium chloride  flush  10-40 mL Intracatheter Q12H   Continuous Infusions:  piperacillin -tazobactam (ZOSYN )  IV 12.5 mL/hr at 02/29/24 1541   vancomycin  200 mL/hr at 02/29/24 1541     Anti-infectives (From admission, onward)    Start     Dose/Rate Route Frequency Ordered Stop   02/29/24 1000  vancomycin  (VANCOCIN ) IVPB 1000 mg/200 mL premix        1,000 mg 200 mL/hr over 60 Minutes Intravenous Every 24 hours 02/28/24 1207     02/24/24 1200  vancomycin  (VANCOREADY) IVPB 750 mg/150 mL  Status:  Discontinued        750 mg 150 mL/hr over 60 Minutes Intravenous  Every 24 hours 02/23/24 1548 02/28/24 1207   02/24/24 1015  cefoTEtan  (CEFOTAN ) 2 g in sodium chloride  0.9 % 100 mL IVPB        2 g 200 mL/hr over 30 Minutes Intravenous  Once 02/24/24 0926 02/24/24 1144   02/23/24 2200  piperacillin -tazobactam (ZOSYN ) IVPB 3.375 g        3.375 g 12.5 mL/hr over 240 Minutes Intravenous Every 8 hours 02/23/24 1548     02/23/24 1230  vancomycin  (VANCOREADY) IVPB 1750 mg/350 mL        1,750 mg 175 mL/hr over 120 Minutes Intravenous  Once 02/23/24 1215 02/23/24 1457   02/23/24 1100  piperacillin -tazobactam (ZOSYN ) IVPB 3.375 g        3.375 g 100 mL/hr over 30 Minutes Intravenous  Once 02/23/24 1055 02/23/24 1159              Family  Communication/Anticipated D/C date and plan/Code Status   DVT prophylaxis: heparin  injection 5,000 Units Start: 02/23/24 2200 SCDs Start: 02/23/24 1449     Code Status: Full Code  Family Communication: None Disposition Plan: Plan to discharge to SNF   Status is: Inpatient Remains inpatient appropriate because: S/p Hartman's procedure       Subjective:   Interval events noted.  She complains of pain and discomfort at the IV site on the right wrist.    Objective:    Vitals:   02/29/24 0312 02/29/24 0451 02/29/24 0832 02/29/24 1537  BP: (!) 149/57  (!) 155/63 (!) 158/68  Pulse: 81  76 79  Resp: 20  18 14   Temp: 98.7 F (37.1 C)  98.3 F (36.8 C) 97.6 F (36.4 C)  TempSrc: Oral     SpO2: 100%  100% 100%  Weight:  95.3 kg    Height:       No data found.   Intake/Output Summary (Last 24 hours) at 02/29/2024 1620 Last data filed at 02/29/2024 1541 Gross per 24 hour  Intake 642.5 ml  Output 1400 ml  Net -757.5 ml   Filed Weights   02/27/24 0737 02/28/24 0426 02/29/24 0451  Weight: 83.4 kg 91.1 kg 95.3 kg    Exam:  GEN: NAD SKIN: Warm and dry EYES: No pallor or icterus ENT: MMM CV: RRR PULM: CTA B ABD: soft, ND, NT, surgical wound with staples, +LLQ ostomy, +BS CNS: AAO x 3, non focal EXT: No edema or tenderness        Data Reviewed:   I have personally reviewed following labs and imaging studies:  Labs: Labs show the following:   Basic Metabolic Panel: Recent Labs  Lab 02/25/24 0341 02/26/24 0438 02/27/24 0515 02/28/24 0511 02/29/24 0510  NA 142 141 142 142 139  K 3.6 3.5 3.3* 4.0 4.0  CL 113* 111 113* 113* 111  CO2 17* 22 21* 21* 21*  GLUCOSE 153* 86 96 93 100*  BUN 19 22 20 19 12   CREATININE 1.06* 0.95 0.86 0.68 0.53  CALCIUM  7.2* 8.0* 8.7* 9.1 8.8*  MG 1.4* 2.4 1.9 1.8 1.5*  PHOS  --  2.2* 2.3* 3.3 4.0   GFR Estimated Creatinine Clearance: 61.8 mL/min (by C-G formula based on SCr of 0.53 mg/dL). Liver Function  Tests: Recent Labs  Lab 02/23/24 1058  AST 25  ALT 8  ALKPHOS 45  BILITOT 1.4*  PROT 6.0*  ALBUMIN  2.6*   No results for input(s): LIPASE, AMYLASE in the last 168 hours. No results for input(s): AMMONIA in the last 168 hours.  Coagulation profile Recent Labs  Lab 02/23/24 1058  INR 1.3*    CBC: Recent Labs  Lab 02/23/24 1058 02/24/24 0416 02/25/24 0341 02/26/24 0438 02/27/24 0515 02/28/24 0511 02/29/24 0510  WBC 0.5*   < > 3.4* 20.8* 36.6* 35.5* 31.4*  NEUTROABS 0.1*  --   --   --   --   --   --   HGB 8.7*   < > 8.9* 9.2* 9.1* 9.2* 8.6*  HCT 27.3*   < > 28.0* 28.1* 27.7* 28.3* 26.9*  MCV 100.4*   < > 98.2 97.2 95.5 96.9 98.9  PLT 76*   < > 117* 107* 121* 145* 155   < > = values in this interval not displayed.   Cardiac Enzymes: No results for input(s): CKTOTAL, CKMB, CKMBINDEX, TROPONINI in the last 168 hours. BNP (last 3 results) No results for input(s): PROBNP in the last 8760 hours. CBG: Recent Labs  Lab 02/28/24 1957 02/28/24 2315 02/29/24 0314 02/29/24 0850 02/29/24 1155  GLUCAP 126* 148* 99 86 122*   D-Dimer: No results for input(s): DDIMER in the last 72 hours. Hgb A1c: No results for input(s): HGBA1C in the last 72 hours. Lipid Profile: No results for input(s): CHOL, HDL, LDLCALC, TRIG, CHOLHDL, LDLDIRECT in the last 72 hours. Thyroid  function studies: No results for input(s): TSH, T4TOTAL, T3FREE, THYROIDAB in the last 72 hours.  Invalid input(s): FREET3 Anemia work up: No results for input(s): VITAMINB12, FOLATE, FERRITIN, TIBC, IRON , RETICCTPCT in the last 72 hours. Sepsis Labs: Recent Labs  Lab 02/23/24 1222 02/23/24 2022 02/23/24 2331 02/24/24 0416 02/24/24 1652 02/26/24 0438 02/27/24 0515 02/28/24 0511 02/29/24 0510  WBC  --   --   --  1.0*   < > 20.8* 36.6* 35.5* 31.4*  LATICACIDVEN 2.8* 3.3* 2.4* 2.0*  --   --   --   --   --    < > = values in this interval not displayed.     Microbiology Recent Results (from the past 240 hours)  Culture, blood (Routine x 2)     Status: None   Collection Time: 02/23/24 10:55 AM   Specimen: BLOOD  Result Value Ref Range Status   Specimen Description BLOOD PORTA CATH LEFT CHEST  Final   Special Requests   Final    BOTTLES DRAWN AEROBIC AND ANAEROBIC Blood Culture results may not be optimal due to an inadequate volume of blood received in culture bottles   Culture   Final    NO GROWTH 5 DAYS Performed at [facility], [address] [place], [place] [address]    Report Status 02/28/2024 FINAL  Final  Culture, blood (Routine x 2)     Status: None   Collection Time: 02/23/24 11:09 AM   Specimen: BLOOD  Result Value Ref Range Status   Specimen Description BLOOD LEFT ANTECUBITAL  Final   Special Requests   Final    BOTTLES DRAWN AEROBIC AND ANAEROBIC Blood Culture results may not be optimal due to an inadequate volume of blood received in culture bottles   Culture   Final    NO GROWTH 5 DAYS Performed at [facility], [address] [place], [place] [address]    Report Status 02/28/2024 FINAL  Final  Urine Culture     Status: None   Collection Time: 02/23/24 11:09 AM   Specimen: Urine, Clean Catch  Result Value Ref Range Status   Specimen Description   Final    URINE, CLEAN CATCH Performed  at [facility], [address] [place], [place] [address]    Special Requests   Final    NONE Performed at [facility], [address] [place], [place] [address]    Culture   Final    NO GROWTH Performed at [facility], [address], [place], [place] [address]    Report Status 02/24/2024 FINAL  Final  MRSA Next Gen by PCR, Nasal     Status: Abnormal   Collection Time: 02/23/24  8:22 PM   Specimen: Nasal Mucosa; Nasal Swab  Result Value Ref Range Status   MRSA by PCR Next Gen DETECTED (A) NOT DETECTED Final    Comment: RESULT CALLED TO, READ BACK BY AND VERIFIED  WITH: SHANON DOUTHERTY 2226 02/23/24 MU (NOTE) The GeneXpert MRSA Assay (FDA approved for NASAL specimens only), is one component of a comprehensive MRSA colonization surveillance program. It is not intended to diagnose MRSA infection nor to guide or monitor treatment for MRSA infections. Test performance is not FDA approved in patients less than 21 years old. Performed at [facility], [address] [place], [place] [address]   Aerobic/Anaerobic Culture w Gram Stain (surgical/deep wound)     Status: None (Preliminary result)   Collection Time: 02/24/24 11:20 AM   Specimen: Path fluid; Body Fluid  Result Value Ref Range Status   Specimen Description   Final    ABSCESS Performed at [facility], [address], [place], [place] [address]    Special Requests   Final    NONE Performed at [facility], [address] [place], [place] [address]    Gram Stain   Final    NO WBC SEEN RARE GRAM POSITIVE COCCI IN PAIRS Performed at [facility], [address], [place], [place] [address]    Culture   Final    MODERATE METHICILLIN RESISTANT STAPHYLOCOCCUS AUREUS ABUNDANT ENTEROCOCCUS FAECIUM NO ANAEROBES ISOLATED; CULTURE IN PROGRESS FOR 5 DAYS    Report Status PENDING  Incomplete   Organism ID, Bacteria METHICILLIN RESISTANT STAPHYLOCOCCUS AUREUS  Final   Organism ID, Bacteria ENTEROCOCCUS FAECIUM  Final      Susceptibility   Enterococcus faecium - MIC*    AMPICILLIN <=2 SENSITIVE Sensitive     VANCOMYCIN  <=0.5 SENSITIVE Sensitive     GENTAMICIN SYNERGY SENSITIVE Sensitive     * ABUNDANT ENTEROCOCCUS FAECIUM   Methicillin resistant staphylococcus aureus - MIC*    CIPROFLOXACIN  >=8 RESISTANT Resistant     ERYTHROMYCIN >=8 RESISTANT Resistant     GENTAMICIN <=0.5 SENSITIVE Sensitive     OXACILLIN >=4 RESISTANT Resistant     TETRACYCLINE >=16 RESISTANT Resistant     VANCOMYCIN  1 SENSITIVE Sensitive     TRIMETH/SULFA <=10 SENSITIVE Sensitive      CLINDAMYCIN RESISTANT Resistant     RIFAMPIN <=0.5 SENSITIVE Sensitive     Inducible Clindamycin POSITIVE Resistant     LINEZOLID  2 SENSITIVE Sensitive     * MODERATE METHICILLIN RESISTANT STAPHYLOCOCCUS AUREUS    Procedures and diagnostic studies:  No results found.             LOS: 6 days   Tatem Holsonback  [facility]   Pager on [email or web address]. If 7PM-7AM, please contact night-coverage at [email or web address]     02/29/2024, 4:20 PM

## 2024-02-29 NOTE — Progress Notes (Signed)
 CC: POD # 3 Subjective:  Wbc stable at 31.4 K as expected, s/p Granix , and plts increased.  Uo adequate,  Denies n/v, reports little appetite thus far, tolerating clear liquids well.  Will advance to full liquid diet. stool in recorded yesterday, minimal flatus.  Objective: Vital signs in last 24 hours: Temp:  [97.3 F (36.3 C)-99.5 F (37.5 C)] 98.3 F (36.8 C) (08/10 0832) Pulse Rate:  [76-82] 76 (08/10 0832) Resp:  [17-24] 18 (08/10 0832) BP: (141-158)/(57-64) 155/63 (08/10 0832) SpO2:  [100 %] 100 % (08/10 0832) Weight:  [95.3 kg] 95.3 kg (08/10 0451) Last BM Date : 02/28/24  Intake/Output from previous day: 08/09 0701 - 08/10 0700 In: 540 [P.O.:540] Out: 750 [Urine:400; Stool:350] Intake/Output this shift: No intake/output data recorded.  Physical exam:  Chronically ill Jai:dnqu , ostomy w/ increased stool output. Some flatus noted. Midline wound w dressing and penrose in place, some serosanguinous staining of honeycomb, now clearly old, no evidence of active bleeding. JP serous.  Will continue dressing, leaving it intact another day. Alert, and well spoken, motivated to do what is needed. Family not present this AM.   Lab Results: CBC  Recent Labs    02/28/24 0511 02/29/24 0510  WBC 35.5* 31.4*  HGB 9.2* 8.6*  HCT 28.3* 26.9*  PLT 145* 155   BMET Recent Labs    02/28/24 0511 02/29/24 0510  NA 142 139  K 4.0 4.0  CL 113* 111  CO2 21* 21*  GLUCOSE 93 100*  BUN 19 12  CREATININE 0.68 0.53  CALCIUM  9.1 8.8*   PT/INR No results for input(s): LABPROT, INR in the last 72 hours.  ABG No results for input(s): PHART, HCO3 in the last 72 hours.  Invalid input(s): PCO2, PO2   Culture MODERATE STAPHYLOCOCCUS AUREUS ABUNDANT ENTEROCOCCUS FAECIUM SUSCEPTIBILITIES TO FOLLOW    Studies/Results: No results found.   Anti-infectives: Anti-infectives (From admission, onward)    Start     Dose/Rate Route Frequency Ordered Stop    02/29/24 1000  vancomycin  (VANCOCIN ) IVPB 1000 mg/200 mL premix        1,000 mg 200 mL/hr over 60 Minutes Intravenous Every 24 hours 02/28/24 1207     02/24/24 1200  vancomycin  (VANCOREADY) IVPB 750 mg/150 mL  Status:  Discontinued        750 mg 150 mL/hr over 60 Minutes Intravenous Every 24 hours 02/23/24 1548 02/28/24 1207   02/24/24 1015  cefoTEtan  (CEFOTAN ) 2 g in sodium chloride  0.9 % 100 mL IVPB        2 g 200 mL/hr over 30 Minutes Intravenous  Once 02/24/24 0926 02/24/24 1144   02/23/24 2200  piperacillin -tazobactam (ZOSYN ) IVPB 3.375 g        3.375 g 12.5 mL/hr over 240 Minutes Intravenous Every 8 hours 02/23/24 1548     02/23/24 1230  vancomycin  (VANCOREADY) IVPB 1750 mg/350 mL        1,750 mg 175 mL/hr over 120 Minutes Intravenous  Once 02/23/24 1215 02/23/24 1457   02/23/24 1100  piperacillin -tazobactam (ZOSYN ) IVPB 3.375 g        3.375 g 100 mL/hr over 30 Minutes Intravenous  Once 02/23/24 1055 02/23/24 1159       Assessment/Plan: Ileus seems to be improving following Hartmann's, will advance to full liquids.  Nutritional supplements.  Mobilize Monitor lytes/CBC.  Continue a/bs, on Vancomycin  (nasal swab + for MRSA)  and Zosyn .  Ostomy nurse f/u appreciated.   Appreciate primary svc care.  Honor Leghorn, M.D., [facility]  02/29/2024 ; 11:03 AM

## 2024-03-01 DIAGNOSIS — R6520 Severe sepsis without septic shock: Secondary | ICD-10-CM | POA: Diagnosis not present

## 2024-03-01 DIAGNOSIS — A419 Sepsis, unspecified organism: Secondary | ICD-10-CM | POA: Diagnosis not present

## 2024-03-01 LAB — GLUCOSE, CAPILLARY
Glucose-Capillary: 100 mg/dL — ABNORMAL HIGH (ref 70–99)
Glucose-Capillary: 103 mg/dL — ABNORMAL HIGH (ref 70–99)
Glucose-Capillary: 110 mg/dL — ABNORMAL HIGH (ref 70–99)
Glucose-Capillary: 120 mg/dL — ABNORMAL HIGH (ref 70–99)
Glucose-Capillary: 121 mg/dL — ABNORMAL HIGH (ref 70–99)
Glucose-Capillary: 124 mg/dL — ABNORMAL HIGH (ref 70–99)
Glucose-Capillary: 130 mg/dL — ABNORMAL HIGH (ref 70–99)
Glucose-Capillary: 144 mg/dL — ABNORMAL HIGH (ref 70–99)
Glucose-Capillary: 95 mg/dL (ref 70–99)

## 2024-03-01 LAB — BASIC METABOLIC PANEL WITH GFR
Anion gap: 7 (ref 5–15)
BUN: 9 mg/dL (ref 8–23)
CO2: 22 mmol/L (ref 22–32)
Calcium: 8.8 mg/dL — ABNORMAL LOW (ref 8.9–10.3)
Chloride: 110 mmol/L (ref 98–111)
Creatinine, Ser: 0.47 mg/dL (ref 0.44–1.00)
GFR, Estimated: >60 mL/min (ref >60)
Glucose, Bld: 151 mg/dL — ABNORMAL HIGH (ref 70–99)
Potassium: 3.5 mmol/L (ref 3.5–5.1)
Sodium: 139 mmol/L (ref 135–145)

## 2024-03-01 LAB — CBC WITH DIFFERENTIAL/PLATELET
Abs Immature Granulocytes: 1.84 K/uL — ABNORMAL HIGH (ref 0.00–0.07)
Basophils Absolute: 0.1 K/uL (ref 0.0–0.1)
Basophils Relative: 0 %
Eosinophils Absolute: 0 K/uL (ref 0.0–0.5)
Eosinophils Relative: 0 %
HCT: 28 % — ABNORMAL LOW (ref 36.0–46.0)
Hemoglobin: 9.1 g/dL — ABNORMAL LOW (ref 12.0–15.0)
Immature Granulocytes: 8 %
Lymphocytes Relative: 7 %
Lymphs Abs: 1.7 K/uL (ref 0.7–4.0)
MCH: 32 pg (ref 26.0–34.0)
MCHC: 32.5 g/dL (ref 30.0–36.0)
MCV: 98.6 fL (ref 80.0–100.0)
Monocytes Absolute: 1 K/uL (ref 0.1–1.0)
Monocytes Relative: 5 %
Neutro Abs: 18.2 K/uL — ABNORMAL HIGH (ref 1.7–7.7)
Neutrophils Relative %: 80 %
Platelets: 180 K/uL (ref 150–400)
RBC: 2.84 MIL/uL — ABNORMAL LOW (ref 3.87–5.11)
RDW: 17.1 % — ABNORMAL HIGH (ref 11.5–15.5)
Smear Review: NORMAL
WBC: 22.9 K/uL — ABNORMAL HIGH (ref 4.0–10.5)
nRBC: 0.1 % (ref 0.0–0.2)

## 2024-03-01 LAB — MAGNESIUM: Magnesium: 1.4 mg/dL — ABNORMAL LOW (ref 1.7–2.4)

## 2024-03-01 LAB — PHOSPHORUS: Phosphorus: 3.4 mg/dL (ref 2.5–4.6)

## 2024-03-01 MED ORDER — ENSURE PLUS HIGH PROTEIN PO LIQD
237.0000 mL | Freq: Three times a day (TID) | ORAL | Status: DC
  Administered 2024-03-01 – 2024-03-02 (×10): 237 mL via ORAL

## 2024-03-01 MED ORDER — MAGNESIUM SULFATE 2 GM/50ML IV SOLN
2.0000 g | Freq: Once | INTRAVENOUS | Status: AC
  Administered 2024-03-01 (×2): 2 g via INTRAVENOUS
  Filled 2024-03-01: qty 50

## 2024-03-01 MED ORDER — FUROSEMIDE 10 MG/ML IJ SOLN
20.0000 mg | Freq: Once | INTRAMUSCULAR | Status: AC
  Administered 2024-03-01 (×2): 20 mg via INTRAVENOUS
  Filled 2024-03-01: qty 4

## 2024-03-01 MED ORDER — HYDROCHLOROTHIAZIDE 25 MG PO TABS
25.0000 mg | ORAL_TABLET | Freq: Every day | ORAL | Status: DC
  Administered 2024-03-01 – 2024-03-02 (×4): 25 mg via ORAL
  Filled 2024-03-01 (×2): qty 1

## 2024-03-01 NOTE — [place] FL2 (Signed)
 Register  MEDICAID FL2 LEVEL OF CARE FORM     IDENTIFICATION  Patient Name: [name] Birthdate: Jan 09, 1946 Sex: female Admission Date (Current Location): 02/23/2024  [facility] and [place] Number:  Chiropodist and Address:         Provider Number: [id number]  Attending Physician Name and Address:  Jens Durand, MD  Relative Name and Phone Number:       Current Level of Care: Hospital Recommended Level of Care: Skilled Nursing Facility Prior Approval Number:    Date Approved/Denied:   PASRR Number:    Discharge Plan: SNF    Current Diagnoses: Patient Active Problem List   Diagnosis Date Noted   Severe sepsis (HCC) 02/23/2024   Requires daily assistance for activities of daily living (ADL) and comfort needs 02/17/2024   Encounter for support and coordination of transition of care 02/17/2024   Diverticulitis 02/03/2024   Acute diverticulitis 01/25/2024   Invasive ductal carcinoma of breast, female, right (HCC) 10/22/2023   BRCA2 gene mutation positive 10/13/2023   Genetic testing 10/10/2023   Breast cancer of lower-inner quadrant of right female breast (HCC) 09/16/2023   Depression, major, single episode, severe (HCC) 08/25/2023   Complicated grief 05/12/2023   Insomnia due to anxiety and fear 02/01/2023   Decreased hearing 12/15/2022   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   Thrombocytopenia (HCC) 12/03/2022   Hypokalemia 12/01/2022   Hypomagnesemia 12/01/2022   Transient alteration of awareness 12/01/2022   Bilateral lower extremity edema 10/18/2022   Fatigue due to excessive exertion 06/25/2022   Reduced vision 06/18/2021   Osteopenia 06/17/2021   Upper airway cough syndrome 06/13/2021   Chronic respiratory failure with hypoxia (HCC) 01/29/2021   DOE (dyspnea on exertion) 01/29/2021   Bilateral shoulder pain 08/14/2020   Trigger finger, left little finger 06/09/2019   Alteration in skin integrity due to moisture 03/30/2019   Chronic  right shoulder pain 07/05/2018   Intermittent knee pain 05/28/2018   Vitamin D  deficiency 11/22/2015   Back pain 09/15/2013   Dermatomycosis 12/22/2012   Carpal tunnel syndrome on left 07/31/2011   Prediabetes 10/16/2007   Hyperlipidemia LDL goal <100 10/16/2007   Morbid obesity due to excess calories (HCC) complicated by hbp/hyperlipidemia 10/16/2007   Anxiety and depression 10/16/2007   Essential hypertension 10/16/2007   Pulmonary hypertension (HCC) 10/16/2007   GERD 10/16/2007   DIVERTICULOSIS OF COLON 10/16/2007   OSTEOARTHRITIS, KNEES, 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    Orientation RESPIRATION BLADDER Height & Weight     Self, Time, Situation, Place  O2 (2L [place]) Continent Weight: 87.5 kg Height:  5' (152.4 cm)  BEHAVIORAL SYMPTOMS/MOOD NEUROLOGICAL BOWEL NUTRITION STATUS      Colostomy Diet (soft)  AMBULATORY STATUS COMMUNICATION OF NEEDS Skin   Extensive Assist Verbally Surgical wounds, Other (Comment) (JP drain)                       Personal Care Assistance Level of Assistance              Functional Limitations Info             SPECIAL CARE FACTORS FREQUENCY  PT (By licensed PT), OT (By licensed OT)                    Contractures Contractures Info: Not present    Additional Factors Info  Code Status, Allergies Code Status Info: Full Allergies Info: NKDA           Current Medications (  03/01/2024):  This is the current hospital active medication list Current Facility-Administered Medications  Medication Dose Route Frequency Provider Last Rate Last Admin   acetaminophen  (TYLENOL ) tablet 1,000 mg  1,000 mg Oral Q6H Pabon, Diego F, MD   1,000 mg at 03/01/24 1156   atorvastatin  (LIPITOR) tablet 20 mg  20 mg Oral Daily Fausto Sor A, DO   20 mg at 03/01/24 9050   Chlorhexidine  Gluconate Cloth 2 % PADS 6 each  6 each Topical Daily Jordis Laneta FALCON, MD   6 each at 03/01/24 0949   feeding supplement (ENSURE PLUS HIGH PROTEIN) liquid 237 mL  237 mL Oral  TID BM Jens Durand, MD   237 mL at 03/01/24 1322   gabapentin  (NEURONTIN ) capsule 100 mg  100 mg Oral QHS Fausto Sor A, DO   100 mg at 02/29/24 2220   heparin  injection 5,000 Units  5,000 Units Subcutaneous Q12H Jordis Laneta F, MD   5,000 Units at 03/01/24 9050   hydrochlorothiazide  (HYDRODIURIL ) tablet 25 mg  25 mg Oral Daily Jens Durand, MD       insulin  aspart (novoLOG ) injection 1-3 Units  1-3 Units Subcutaneous Q4H Jordis Laneta FALCON, MD   1 Units at 03/01/24 1156   losartan  (COZAAR ) tablet 50 mg  50 mg Oral Daily Fausto Sor A, DO   50 mg at 03/01/24 0949   melatonin tablet 2.5 mg  2.5 mg Oral QHS Mansy, Jan A, MD   2.5 mg at 02/29/24 2220   metoprolol  succinate (TOPROL -XL) 24 hr tablet 50 mg  50 mg Oral Daily Fausto Sor A, DO   50 mg at 03/01/24 9050   multivitamin with minerals tablet 1 tablet  1 tablet Oral Daily Fausto Sor A, DO   1 tablet at 03/01/24 0949   nystatin  (MYCOSTATIN ) 100000 UNIT/ML suspension 500,000 Units  5 mL Oral QID Fausto Sor A, DO   500,000 Units at 03/01/24 9050   ondansetron  (ZOFRAN ) tablet 4 mg  4 mg Oral Q6H PRN Pabon, Laneta FALCON, MD       Or   ondansetron  (ZOFRAN ) injection 4 mg  4 mg Intravenous Q6H PRN Pabon, Laneta FALCON, MD   4 mg at 03/01/24 1322   Oral care mouth rinse  15 mL Mouth Rinse PRN Pabon, Diego F, MD       oxyCODONE  (Oxy IR/ROXICODONE ) immediate release tablet 5 mg  5 mg Oral Q4H PRN Pabon, Diego F, MD   5 mg at 02/28/24 2207   pantoprazole  (PROTONIX ) EC tablet 40 mg  40 mg Oral Daily Fausto Sor A, DO   40 mg at 03/01/24 9050   piperacillin -tazobactam (ZOSYN ) IVPB 3.375 g  3.375 g Intravenous Q8H Pabon, Diego F, MD 12.5 mL/hr at 03/01/24 0526 3.375 g at 03/01/24 0526   sertraline  (ZOLOFT ) tablet 50 mg  50 mg Oral Daily Fausto Sor A, DO   50 mg at 03/01/24 0949   sodium chloride  flush (NS) 0.9 % injection 10-40 mL  10-40 mL Intracatheter Q12H Pabon, Diego F, MD   20 mL at 03/01/24 0949   sodium chloride  flush (NS) 0.9 %  injection 10-40 mL  10-40 mL Intracatheter PRN Pabon, Laneta FALCON, MD       temazepam  (RESTORIL ) capsule 15 mg  15 mg Oral QHS PRN Fausto Sor A, DO       vancomycin  (VANCOCIN ) IVPB 1000 mg/200 mL premix  1,000 mg Intravenous Q24H Fausto Sor A, DO 200 mL/hr at 03/01/24 0950 1,000 mg at 03/01/24 0950  Discharge Medications: Please see discharge summary for a list of discharge medications.  Relevant Imaging Results:  Relevant Lab Results:   Additional Information ss [id number]  [name], RN

## 2024-03-01 NOTE — TOC Progression Note (Signed)
 Transition of Care [facility]) - Progression Note    Patient Details  Name: [name] MRN: [id number] Date of Birth: July 22, 1946  Transition of Care [facility]) CM/SW Contact  [name], RN Phone Number: 03/01/2024, 1:50 PM  Clinical Narrative:     Per MD patient will not be receiving chemo or radiation at discharge  Bed search started PASRR obtained Fl2 sent for signature                     Expected Discharge Plan and Services                                               Social Drivers of Health (SDOH) Interventions SDOH Screenings   Food Insecurity: No Food Insecurity (02/23/2024)  Housing: Low Risk  (02/23/2024)  Transportation Needs: No Transportation Needs (02/23/2024)  Utilities: Not At Risk (02/23/2024)  Alcohol Screen: Low Risk  (08/12/2023)  Depression (PHQ2-9): Low Risk  (02/17/2024)  Financial Resource Strain: Low Risk  (08/12/2023)  Physical Activity: Sufficiently Active (08/12/2023)  Social Connections: Unknown (02/23/2024)  Stress: No Stress Concern Present (08/12/2023)  Tobacco Use: Low Risk  (02/24/2024)  Health Literacy: Adequate Health Literacy (08/12/2023)    Readmission Risk Interventions     No data to display

## 2024-03-01 NOTE — Progress Notes (Signed)
 Mobility Specialist - Progress Note   03/01/24 1613  Mobility  Activity Dangled on edge of bed;Stood at bedside (w/ NT)  Level of Assistance Contact guard assist, steadying assist  Assistive Device None  Distance Ambulated (ft) 2 ft  Activity Response Tolerated well  Mobility visit 1 Mobility  Mobility Specialist Start Time (ACUTE ONLY) 1530  Mobility Specialist Stop Time (ACUTE ONLY) 1554  Mobility Specialist Time Calculation (min) (ACUTE ONLY) 24 min   Pt supine upon entry, soiled linen. Pt completed bed mob ModA, STS from EOB (+2 for safety, not utilized) and stood at bedside CGA-MinG for ~ 2-3 mins while NT assisted with peri care. Pt took ~ 2 lateral steps towards the [facility]  CGA before returning seated EOB. Pt left semi fowler with alarm set and needs within reach.   America Silvan Mobility Specialist 03/01/24 4:19 PM

## 2024-03-01 NOTE — Progress Notes (Signed)
 Progress Note    [name]  FMW:[id number] DOB: 12-19-1945  DOA: 02/23/2024 PCP: Antonetta Rollene BRAVO, MD      Brief Narrative:    Medical records reviewed and are as summarized below:  [name] is a 78 y.o. female with medical history significant for breast cancer on treatment, hyperlipidemia, hypertension, who presented to the hospital with abdominal pain.  Was recently discharged from the hospital on 02/03/2024 after hospitalization for acute diverticulitis is now managed with antibiotics.  She was admitted to the hospital for severe sepsis secondary to acute diverticulitis.  She was treated with empiric IV antibiotics.        Assessment/Plan:   Principal Problem:   Severe sepsis (HCC) Active Problems:   Hyperlipidemia LDL goal <100   Essential hypertension   Acute diverticulitis   Nutrition Problem: Inadequate oral intake Etiology: acute illness   Body mass index is 37.67 kg/m.  (Morbid obesity)   Severe sepsis secondary to acute diverticulitis: S/p open Hartman's procedure, colostomy creation on 02/24/2024 Diet advanced to soft diet by general surgeon. She is still on IV antibiotics.  Will defer duration of antibiotics to surgeon. Analgesics as needed for pain.   Tachycardia, tachypnea: improved She is off of IV fluids. 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 Dr. Poplin, general surgeon, recommends holding chemotherapy and radiation therapy for a few weeks so that patient can go to SNF. Pancytopenia: Improved Patient now has leukocytosis.  Recent Granix  probably contributing to leukocytosis. S/p transfusion of 1 unit of platelets and transfusion of PRBCs on 02/24/2024    Hypomagnesemia: Magnesium  is still low at 1.4.  Replete with IV magnesium  sulfate. Hypokalemia: Improved Hypophosphatemia: Improved   Hypertension: BP is uncontrolled.  Continue losartan .  Restart HCTZ.  Monitor BP and adjust  antihypertensives accordingly.    Comorbidities include hyperlipidemia, hypertension, GERD, insomnia    Diet Order             Diet full liquid Room service appropriate? Yes; Fluid consistency: Thin  Diet effective now                                  Consultants: General Surgeon  Procedures: Open Hartman's procedure, takedown splenic flexure, repair of ventral hernia, appendectomy on 02/24/2024    Medications:    acetaminophen   1,000 mg Oral Q6H   atorvastatin   20 mg Oral Daily   Chlorhexidine  Gluconate Cloth  6 each Topical Daily   feeding supplement  237 mL Oral TID BM   furosemide   20 mg Intravenous Once   gabapentin   100 mg Oral QHS   heparin  injection (subcutaneous)  5,000 Units Subcutaneous Q12H   insulin  aspart  1-3 Units Subcutaneous Q4H   losartan   50 mg Oral Daily   melatonin  2.5 mg Oral QHS   metoprolol  succinate  50 mg Oral Daily   multivitamin with minerals  1 tablet Oral Daily   nystatin   5 mL Oral QID   pantoprazole   40 mg Oral Daily   sertraline   50 mg Oral Daily   sodium chloride  flush  10-40 mL Intracatheter Q12H   Continuous Infusions:  magnesium  sulfate bolus IVPB     piperacillin -tazobactam (ZOSYN )  IV 3.375 g (03/01/24 0526)   vancomycin  1,000 mg (03/01/24 0950)     Anti-infectives (From admission, onward)    Start  Dose/Rate Route Frequency Ordered Stop   02/29/24 1000  vancomycin  (VANCOCIN ) IVPB 1000 mg/200 mL premix        1,000 mg 200 mL/hr over 60 Minutes Intravenous Every 24 hours 02/28/24 1207     02/24/24 1200  vancomycin  (VANCOREADY) IVPB 750 mg/150 mL  Status:  Discontinued        750 mg 150 mL/hr over 60 Minutes Intravenous Every 24 hours 02/23/24 1548 02/28/24 1207   02/24/24 1015  cefoTEtan  (CEFOTAN ) 2 g in sodium chloride  0.9 % 100 mL IVPB        2 g 200 mL/hr over 30 Minutes Intravenous  Once 02/24/24 0926 02/24/24 1144   02/23/24 2200  piperacillin -tazobactam (ZOSYN ) IVPB 3.375 g        3.375  g 12.5 mL/hr over 240 Minutes Intravenous Every 8 hours 02/23/24 1548     02/23/24 1230  vancomycin  (VANCOREADY) IVPB 1750 mg/350 mL        1,750 mg 175 mL/hr over 120 Minutes Intravenous  Once 02/23/24 1215 02/23/24 1457   02/23/24 1100  piperacillin -tazobactam (ZOSYN ) IVPB 3.375 g        3.375 g 100 mL/hr over 30 Minutes Intravenous  Once 02/23/24 1055 02/23/24 1159              Family Communication/Anticipated D/C date and plan/Code Status   DVT prophylaxis: heparin  injection 5,000 Units Start: 02/23/24 2200 SCDs Start: 02/23/24 1449     Code Status: Full Code  Family Communication: None Disposition Plan: Plan to discharge to SNF   Status is: Inpatient Remains inpatient appropriate because: S/p Hartman's procedure       Subjective:   Interval events noted.  She was having breakfast but was unhappy with the choice of diet.  She was hoping for more solid food..  No abdominal pain or vomiting.  Objective:    Vitals:   02/29/24 2011 03/01/24 0513 03/01/24 0600 03/01/24 0826  BP: (!) 166/73 (!) 163/66  (!) 166/75  Pulse: 89 83  83  Resp: 15 15  18   Temp: 98.1 F (36.7 C) 98.2 F (36.8 C)  97.7 F (36.5 C)  TempSrc:    Oral  SpO2: 100% 100%  100%  Weight:   87.5 kg   Height:       No data found.   Intake/Output Summary (Last 24 hours) at 03/01/2024 1011 Last data filed at 03/01/2024 0526 Gross per 24 hour  Intake 771.38 ml  Output 2130 ml  Net -1358.62 ml   Filed Weights   02/28/24 0426 02/29/24 0451 03/01/24 0600  Weight: 91.1 kg 95.3 kg 87.5 kg    Exam:   GEN: NAD SKIN: Warm and dry.  Port-A-Cath on left upper chest EYES: No pallor or icterus ENT: MMM CV: RRR PULM: CTA B ABD: soft, ND, surgical wound with intact staples, ostomy bag empty, +BS CNS: AAO x 3, non focal EXT: No edema or tenderness    Data Reviewed:   I have personally reviewed following labs and imaging studies:  Labs: Labs show the following:   Basic Metabolic  Panel: Recent Labs  Lab 02/26/24 0438 02/27/24 0515 02/28/24 0511 02/29/24 0510 03/01/24 0833  NA 141 142 142 139 139  K 3.5 3.3* 4.0 4.0 3.5  CL 111 113* 113* 111 110  CO2 22 21* 21* 21* 22  GLUCOSE 86 96 93 100* 151*  BUN 22 20 19 12 9   CREATININE 0.95 0.86 0.68 0.53 0.47  CALCIUM  8.0* 8.7* 9.1 8.8* 8.8*  MG 2.4 1.9 1.8 1.5* 1.4*  PHOS 2.2* 2.3* 3.3 4.0 3.4   GFR Estimated Creatinine Clearance: 58.8 mL/min (by C-G formula based on SCr of 0.47 mg/dL). Liver Function Tests: Recent Labs  Lab 02/23/24 1058  AST 25  ALT 8  ALKPHOS 45  BILITOT 1.4*  PROT 6.0*  ALBUMIN  2.6*   No results for input(s): LIPASE, AMYLASE in the last 168 hours. No results for input(s): AMMONIA in the last 168 hours. Coagulation profile Recent Labs  Lab 02/23/24 1058  INR 1.3*    CBC: Recent Labs  Lab 02/23/24 1058 02/24/24 0416 02/26/24 0438 02/27/24 0515 02/28/24 0511 02/29/24 0510 03/01/24 0833  WBC 0.5*   < > 20.8* 36.6* 35.5* 31.4* 22.9*  NEUTROABS 0.1*  --   --   --   --   --  18.2*  HGB 8.7*   < > 9.2* 9.1* 9.2* 8.6* 9.1*  HCT 27.3*   < > 28.1* 27.7* 28.3* 26.9* 28.0*  MCV 100.4*   < > 97.2 95.5 96.9 98.9 98.6  PLT 76*   < > 107* 121* 145* 155 180   < > = values in this interval not displayed.   Cardiac Enzymes: No results for input(s): CKTOTAL, CKMB, CKMBINDEX, TROPONINI in the last 168 hours. BNP (last 3 results) No results for input(s): PROBNP in the last 8760 hours. CBG: Recent Labs  Lab 02/29/24 1624 02/29/24 2006 03/01/24 0021 03/01/24 0510 03/01/24 0822  GLUCAP 131* 157* 124* 95 144*   D-Dimer: No results for input(s): DDIMER in the last 72 hours. Hgb A1c: No results for input(s): HGBA1C in the last 72 hours. Lipid Profile: No results for input(s): CHOL, HDL, LDLCALC, TRIG, CHOLHDL, LDLDIRECT in the last 72 hours. Thyroid  function studies: No results for input(s): TSH, T4TOTAL, T3FREE, THYROIDAB in the last 72  hours.  Invalid input(s): FREET3 Anemia work up: No results for input(s): VITAMINB12, FOLATE, FERRITIN, TIBC, IRON , RETICCTPCT in the last 72 hours. Sepsis Labs: Recent Labs  Lab 02/23/24 1222 02/23/24 2022 02/23/24 2331 02/24/24 0416 02/24/24 1652 02/27/24 0515 02/28/24 0511 02/29/24 0510 03/01/24 0833  WBC  --   --   --  1.0*   < > 36.6* 35.5* 31.4* 22.9*  LATICACIDVEN 2.8* 3.3* 2.4* 2.0*  --   --   --   --   --    < > = values in this interval not displayed.    Microbiology Recent Results (from the past 240 hours)  Culture, blood (Routine x 2)     Status: None   Collection Time: 02/23/24 10:55 AM   Specimen: BLOOD  Result Value Ref Range Status   Specimen Description BLOOD PORTA CATH LEFT CHEST  Final   Special Requests   Final    BOTTLES DRAWN AEROBIC AND ANAEROBIC Blood Culture results may not be optimal due to an inadequate volume of blood received in culture bottles   Culture   Final    NO GROWTH 5 DAYS Performed at [facility], [address] [place], [place] [address]    Report Status 02/28/2024 FINAL  Final  Culture, blood (Routine x 2)     Status: None   Collection Time: 02/23/24 11:09 AM   Specimen: BLOOD  Result Value Ref Range Status   Specimen Description BLOOD LEFT ANTECUBITAL  Final   Special Requests   Final    BOTTLES DRAWN AEROBIC AND ANAEROBIC Blood Culture results may not be optimal due to an inadequate volume of blood received in  culture bottles   Culture   Final    NO GROWTH 5 DAYS Performed at [facility], [address] [place], [place] [address]    Report Status 02/28/2024 FINAL  Final  Urine Culture     Status: None   Collection Time: 02/23/24 11:09 AM   Specimen: Urine, Clean Catch  Result Value Ref Range Status   Specimen Description   Final    URINE, CLEAN CATCH Performed at [facility], [address], [place], [place] [address]    Special Requests   Final    NONE Performed at  [facility], [address] [place], [place] [address]    Culture   Final    NO GROWTH Performed at [facility], [address] [place], [place] [address]    Report Status 02/24/2024 FINAL  Final  MRSA Next Gen by PCR, Nasal     Status: Abnormal   Collection Time: 02/23/24  8:22 PM   Specimen: Nasal Mucosa; Nasal Swab  Result Value Ref Range Status   MRSA by PCR Next Gen DETECTED (A) NOT DETECTED Final    Comment: RESULT CALLED TO, READ BACK BY AND VERIFIED WITH: SHANON DOUTHERTY 2226 02/23/24 MU (NOTE) The GeneXpert MRSA Assay (FDA approved for NASAL specimens only), is one component of a comprehensive MRSA colonization surveillance program. It is not intended to diagnose MRSA infection nor to guide or monitor treatment for MRSA infections. Test performance is not FDA approved in patients less than 104 years old. Performed at [facility], [address] [place], [place] [address]   Aerobic/Anaerobic Culture w Gram Stain (surgical/deep wound)     Status: None   Collection Time: 02/24/24 11:20 AM   Specimen: Path fluid; Body Fluid  Result Value Ref Range Status   Specimen Description   Final    ABSCESS Performed at [facility], [address], [place], [place] [address]    Special Requests   Final    NONE Performed at [facility], [address] [place], [place] [address]    Gram Stain NO WBC SEEN RARE GRAM POSITIVE COCCI IN PAIRS   Final   Culture   Final    MODERATE METHICILLIN RESISTANT STAPHYLOCOCCUS AUREUS ABUNDANT ENTEROCOCCUS FAECIUM NO ANAEROBES ISOLATED Performed at [facility], [address], [place], [place] [address]    Report Status 02/29/2024 FINAL  Final   Organism ID, Bacteria METHICILLIN RESISTANT STAPHYLOCOCCUS AUREUS  Final   Organism ID, Bacteria ENTEROCOCCUS FAECIUM  Final      Susceptibility   Enterococcus faecium - MIC*    AMPICILLIN <=2 SENSITIVE Sensitive     VANCOMYCIN  <=0.5 SENSITIVE  Sensitive     GENTAMICIN SYNERGY SENSITIVE Sensitive     * ABUNDANT ENTEROCOCCUS FAECIUM   Methicillin resistant staphylococcus aureus - MIC*    CIPROFLOXACIN  >=8 RESISTANT Resistant     ERYTHROMYCIN >=8 RESISTANT Resistant     GENTAMICIN <=0.5 SENSITIVE Sensitive     OXACILLIN >=4 RESISTANT Resistant     TETRACYCLINE >=16 RESISTANT Resistant     VANCOMYCIN  1 SENSITIVE Sensitive     TRIMETH/SULFA <=10 SENSITIVE Sensitive     CLINDAMYCIN RESISTANT Resistant     RIFAMPIN <=0.5 SENSITIVE Sensitive     Inducible Clindamycin POSITIVE Resistant     LINEZOLID  2 SENSITIVE Sensitive     * MODERATE METHICILLIN RESISTANT STAPHYLOCOCCUS AUREUS    Procedures and diagnostic studies:  No results found.  LOS: 7 days   Jean Alejos  [facility]   Pager on [email or web address]. If 7PM-7AM, please contact night-coverage at [email or web address]     03/01/2024, 10:11 AM

## 2024-03-01 NOTE — Progress Notes (Signed)
 CC: POD # 6 [email or web address] Subjective: Still weak Ostomy working  Tolerating po Wbc trending down  Objective: Vital signs in last 24 hours: Temp:  [97.6 F (36.4 C)-98.2 F (36.8 C)] 97.7 F (36.5 C) (08/11 0826) Pulse Rate:  [79-89] 83 (08/11 0826) Resp:  [14-18] 18 (08/11 0826) BP: (158-166)/(66-75) 166/75 (08/11 0826) SpO2:  [100 %] 100 % (08/11 0826) Weight:  [87.5 kg] 87.5 kg (08/11 0600) Last BM Date : 03/01/24 (Ostomy)  Intake/Output from previous day: 08/10 0701 - 08/11 0700 In: 771.4 [IV Piggyback:771.4] Out: 2130 [Urine:1600; Drains:180; Stool:350] Intake/Output this shift: Total I/O In: 240 [P.O.:240] Out: 2400 [Urine:2400]  Physical exam:  NAD  Abd: soft, staples in place , penrose removed, healing wel, no rebound or peritonitisl JP serous fluid. Ostomy pink and w stool in bag  Lab Results: CBC  Recent Labs    02/29/24 0510 03/01/24 0833  WBC 31.4* 22.9*  HGB 8.6* 9.1*  HCT 26.9* 28.0*  PLT 155 180   BMET Recent Labs    02/29/24 0510 03/01/24 0833  NA 139 139  K 4.0 3.5  CL 111 110  CO2 21* 22  GLUCOSE 100* 151*  BUN 12 9  CREATININE 0.53 0.47  CALCIUM  8.8* 8.8*   PT/INR No results for input(s): LABPROT, INR in the last 72 hours. ABG No results for input(s): PHART, HCO3 in the last 72 hours.  Invalid input(s): PCO2, PO2  Studies/Results: No results found.  Anti-infectives: Anti-infectives (From admission, onward)    Start     Dose/Rate Route Frequency Ordered Stop   02/29/24 1000  vancomycin  (VANCOCIN ) IVPB 1000 mg/200 mL premix        1,000 mg 200 mL/hr over 60 Minutes Intravenous Every 24 hours 02/28/24 1207     02/24/24 1200  vancomycin  (VANCOREADY) IVPB 750 mg/150 mL  Status:  Discontinued        750 mg 150 mL/hr over 60 Minutes Intravenous Every 24 hours 02/23/24 1548 02/28/24 1207   02/24/24 1015  cefoTEtan  (CEFOTAN ) 2 g in sodium chloride  0.9 % 100 mL IVPB        2 g 200 mL/hr over 30 Minutes Intravenous  Once  02/24/24 0926 02/24/24 1144   02/23/24 2200  piperacillin -tazobactam (ZOSYN ) IVPB 3.375 g        3.375 g 12.5 mL/hr over 240 Minutes Intravenous Every 8 hours 02/23/24 1548     02/23/24 1230  vancomycin  (VANCOREADY) IVPB 1750 mg/350 mL        1,750 mg 175 mL/hr over 120 Minutes Intravenous  Once 02/23/24 1215 02/23/24 1457   02/23/24 1100  piperacillin -tazobactam (ZOSYN ) IVPB 3.375 g        3.375 g 100 mL/hr over 30 Minutes Intravenous  Once 02/23/24 1055 02/23/24 1159       Assessment/Plan:  Doing well Continue a/bs Replace mag Low dose lasix  today Labs in am mobilize  Laneta Luna, MD, [facility]  03/01/2024

## 2024-03-01 NOTE — Plan of Care (Signed)

## 2024-03-01 NOTE — Progress Notes (Signed)
 Nutrition Follow Up Note   DOCUMENTATION CODES:   Obesity unspecified  INTERVENTION:   Ensure Plus High Protein po TID, each supplement provides 350 kcal and 20 grams of protein  Magic cup TID with meals, each supplement provides 290 kcal and 9 grams of protein  MVI po daily   Pt at high refeed risk; recommend monitor potassium, magnesium  and phosphorus labs daily until stable  Daily weights   NUTRITION DIAGNOSIS:   Inadequate oral intake related to acute illness as evidenced by NPO status. -oral intake remains poor   GOAL:   Patient will meet greater than or equal to 90% of their needs -not met   MONITOR:   PO intake, Supplement acceptance, Labs, Weight trends, Skin, I & O's  ASSESSMENT:   78 y/o female with h/o breast cancer s/p lumpectomy on chemotherapy/radiation, anxiety, depression, pulmonary hypertension, GERD, OSA and recent admssion for diverticulitis who is admitted for recurrent complicated diverticulitis (now s/p Hartmann's procedure, takedown of splenic flexure, repair of ventral hernia & appendectomy 8/5), sepsis and shock.  Pt with poor appetite and oral intake in hospital. Pt eating <25% of meals. Pt unhappy with the full liquid diet earlier today but has now been advanced to a soft diet. Pt attempting to drink Ensure supplements but RN reports pt with nausea today. Will monitor oral intakes now that diet has been advanced. Pt with low magnesium  which is being replaced. Pt is having bowel function. Per chart, pt is up ~19lbs since admission but volume status has improved over the past two days; pt did receive lasix  today. Pt +4.6L on her I & Os. Plan is for SNF at discharge.    Medications reviewed and include: heparin , insulin , melatonin, protonix , zosyn , vancomycin    Labs reviewed: K 3.5 wnl, P 3.4 wnl, Mg 1.4(L) Wbc- 22.9(H), Hgb 9.1(L), Hct 28.0(L) Cbgs- 121, 130, 144, 95, 124 x 24 hrs   UOP-   Drains-   Diet Order:    Diet Order              DIET SOFT Room service appropriate? Yes; Fluid consistency: Thin  Diet effective now                  EDUCATION NEEDS:   Not appropriate for education at this time  Skin:  Skin Assessment: Reviewed RN Assessment (incision abdomen)  Last BM:  8/11- via ostomy  Height:   Ht Readings from Last 1 Encounters:  02/24/24 5' (1.524 m)    Weight:   Wt Readings from Last 1 Encounters:  03/01/24 87.5 kg    Ideal Body Weight:  45.45 kg  BMI:  Body mass index is 37.67 kg/m.  Estimated Nutritional Needs:   Kcal:  1700-2000kcal/day  Protein:  85-100g/day  Fluid:  1.4-1.6L/day  Augustin Shams MS, RD, LDN If unable to be reached, please send secure chat to RD inpatient available from 8:00a-4:00p daily

## 2024-03-01 NOTE — Consult Note (Addendum)
 WOC Nurse ostomy follow up Stoma type/location: LLQ colostomy Stomal assessment/size: oval 35 mm x 30 mm.Red and viable with some slough tissue around the ostomy. Peristomal assessment: intact Treatment options for stomal/peristomal skin: 1 1/2 ring [id number]. Output aprox. 150 ml of dark brown liquid stools Ostomy pouching: 1pc.soft convex #[id number]  Education provided:  Education instructions with patient, no family member present. Patient was paying attention to the ostomy care and follow the steps.  - Cut the barrier to the size and shape of the stoma. I showed her how to cut it. - Applied a ring around the ostomy, filling the crease and 1/2 ring at 9 o'clock position. - Take the protect plastic off the barrier. That can be use as a model for the next pouch system.  - Apply the pouch stretching the skin; - Emptying when 1/3 to 1/2 full and how to empty, showed how to open and close the lock in roll system. Pt did with hands on. - Suggested to invite a caregiver to participate in the next ostomy teaching.  Enrolled patient in [place] Secure Start Discharge program: Yes (08/08)  WOC team will follow Thursday. Please reconsult if further assistance is needed. Thank-you,  Lela Holm BSN, CNS, RN, [employer], WOCN  (Phone [phone])

## 2024-03-01 NOTE — Progress Notes (Signed)
 Occupational Therapy Treatment Patient Details Name: [name] MRN: [id number] DOB: 1946/01/20 Today's Date: 03/01/2024   History of present illness Patient is a 78 year old female with diverticulitis with worsening abdominal pain. S/p ex-lap with colostomy. PMH: breast cancer currently undergoing treatment, HLD, HTN   OT comments  Upon entering the room, pt supine in bed and agreeable to OT intervention. Pt does endorse fatigue but motivated. Pt performs bed mobility with min A. She stands with min A at sink to wash face and wash hands ~ 5 minutes with supervision - CGA for balance. Pt fatigues quickly and returns to bed at end of session. Call bell and all needed items within reach upon exiting the room.       If plan is discharge home, recommend the following:  A little help with walking and/or transfers;Assistance with cooking/housework;Assist for transportation;A lot of help with bathing/dressing/bathroom   Equipment Recommendations  Other (comment) (defer to next venue of care)       Precautions / Restrictions Precautions Precautions: Fall Recall of Precautions/Restrictions: Intact       Mobility Bed Mobility Overal bed mobility: Needs Assistance Bed Mobility: Supine to Sit, Sit to Supine     Supine to sit: Min assist Sit to supine: Min assist        Transfers Overall transfer level: Needs assistance Equipment used: Rolling walker (2 wheels) Transfers: Sit to/from Stand Sit to Stand: Min assist                 Balance Overall balance assessment: Needs assistance Sitting-balance support: Feet supported Sitting balance-Leahy Scale: Good     Standing balance support: Bilateral upper extremity supported, Reliant on assistive device for balance, During functional activity Standing balance-Leahy Scale: Fair                             ADL either performed or assessed with clinical judgement   ADL Overall ADL's : Needs assistance/impaired      Grooming: Wash/dry hands;Standing;Wash/dry face;Contact guard assist;Supervision/safety                                      Extremity/Trunk Assessment Upper Extremity Assessment Upper Extremity Assessment: Generalized weakness   Lower Extremity Assessment Lower Extremity Assessment: Generalized weakness        Vision Patient Visual Report: No change from baseline           Communication Communication Communication: No apparent difficulties   Cognition Arousal: Alert Behavior During Therapy: WFL for tasks assessed/performed Cognition: No apparent impairments                               Following commands: Intact        Cueing   Cueing Techniques: Verbal cues             Pertinent Vitals/ Pain       Pain Assessment Pain Assessment: Faces Faces Pain Scale: Hurts a little bit Pain Location: abdomen Pain Descriptors / Indicators: Discomfort Pain Intervention(s): Limited activity within patient's tolerance, Monitored during session, Repositioned         Frequency  Min 2X/week        Progress Toward Goals  OT Goals(current goals can now be found in the care plan section)  Progress towards OT goals: Progressing  toward goals      AM-PAC OT 6 Clicks Daily Activity     Outcome Measure   Help from another person eating meals?: None Help from another person taking care of personal grooming?: None Help from another person toileting, which includes using toliet, bedpan, or urinal?: A Lot Help from another person bathing (including washing, rinsing, drying)?: A Lot Help from another person to put on and taking off regular upper body clothing?: A Little Help from another person to put on and taking off regular lower body clothing?: A Lot 6 Click Score: 17    End of Session Equipment Utilized During Treatment: Rolling walker (2 wheels)  OT Visit Diagnosis: Unsteadiness on feet (R26.81);Muscle weakness (generalized)  (M62.81)   Activity Tolerance Patient tolerated treatment well   Patient Left with call bell/phone within reach;in bed;with bed alarm set   Nurse Communication Mobility status        Time: 8550-8493 OT Time Calculation (min): 17 min  Charges: OT General Charges $OT Visit: 1 Visit OT Treatments $Self Care/Home Management : 8-22 mins  Izetta Claude, MS, OTR/L , CBIS ascom [phone]  03/01/24, 4:26 PM

## 2024-03-02 ENCOUNTER — Other Ambulatory Visit (HOSPITAL_COMMUNITY): Payer: Self-pay

## 2024-03-02 ENCOUNTER — Telehealth (HOSPITAL_COMMUNITY): Payer: Self-pay | Admitting: Pharmacy Technician

## 2024-03-02 DIAGNOSIS — E785 Hyperlipidemia, unspecified: Secondary | ICD-10-CM | POA: Diagnosis not present

## 2024-03-02 DIAGNOSIS — R110 Nausea: Secondary | ICD-10-CM | POA: Diagnosis not present

## 2024-03-02 DIAGNOSIS — I10 Essential (primary) hypertension: Secondary | ICD-10-CM | POA: Diagnosis not present

## 2024-03-02 DIAGNOSIS — R0602 Shortness of breath: Secondary | ICD-10-CM | POA: Diagnosis not present

## 2024-03-02 DIAGNOSIS — K5900 Constipation, unspecified: Secondary | ICD-10-CM | POA: Diagnosis not present

## 2024-03-02 DIAGNOSIS — Z1501 Genetic susceptibility to malignant neoplasm of breast: Secondary | ICD-10-CM | POA: Diagnosis not present

## 2024-03-02 DIAGNOSIS — Z171 Estrogen receptor negative status [ER-]: Secondary | ICD-10-CM | POA: Diagnosis not present

## 2024-03-02 DIAGNOSIS — K5792 Diverticulitis of intestine, part unspecified, without perforation or abscess without bleeding: Secondary | ICD-10-CM | POA: Diagnosis not present

## 2024-03-02 DIAGNOSIS — Z17421 Hormone receptor negative with human epidermal growth factor receptor 2 negative status: Secondary | ICD-10-CM | POA: Diagnosis not present

## 2024-03-02 DIAGNOSIS — E559 Vitamin D deficiency, unspecified: Secondary | ICD-10-CM | POA: Diagnosis not present

## 2024-03-02 DIAGNOSIS — A419 Sepsis, unspecified organism: Secondary | ICD-10-CM | POA: Diagnosis not present

## 2024-03-02 DIAGNOSIS — R6520 Severe sepsis without septic shock: Secondary | ICD-10-CM | POA: Diagnosis not present

## 2024-03-02 DIAGNOSIS — R52 Pain, unspecified: Secondary | ICD-10-CM | POA: Diagnosis not present

## 2024-03-02 DIAGNOSIS — E876 Hypokalemia: Secondary | ICD-10-CM | POA: Diagnosis not present

## 2024-03-02 DIAGNOSIS — C50919 Malignant neoplasm of unspecified site of unspecified female breast: Secondary | ICD-10-CM | POA: Diagnosis not present

## 2024-03-02 DIAGNOSIS — Z741 Need for assistance with personal care: Secondary | ICD-10-CM | POA: Diagnosis not present

## 2024-03-02 DIAGNOSIS — C50311 Malignant neoplasm of lower-inner quadrant of right female breast: Secondary | ICD-10-CM | POA: Diagnosis present

## 2024-03-02 DIAGNOSIS — K5790 Diverticulosis of intestine, part unspecified, without perforation or abscess without bleeding: Secondary | ICD-10-CM | POA: Diagnosis not present

## 2024-03-02 DIAGNOSIS — R2681 Unsteadiness on feet: Secondary | ICD-10-CM | POA: Diagnosis not present

## 2024-03-02 DIAGNOSIS — G4700 Insomnia, unspecified: Secondary | ICD-10-CM | POA: Diagnosis not present

## 2024-03-02 DIAGNOSIS — D72829 Elevated white blood cell count, unspecified: Secondary | ICD-10-CM | POA: Diagnosis not present

## 2024-03-02 DIAGNOSIS — I25119 Atherosclerotic heart disease of native coronary artery with unspecified angina pectoris: Secondary | ICD-10-CM | POA: Diagnosis not present

## 2024-03-02 DIAGNOSIS — M6259 Muscle wasting and atrophy, not elsewhere classified, multiple sites: Secondary | ICD-10-CM | POA: Diagnosis not present

## 2024-03-02 DIAGNOSIS — K219 Gastro-esophageal reflux disease without esophagitis: Secondary | ICD-10-CM | POA: Diagnosis not present

## 2024-03-02 DIAGNOSIS — J309 Allergic rhinitis, unspecified: Secondary | ICD-10-CM | POA: Diagnosis not present

## 2024-03-02 DIAGNOSIS — B379 Candidiasis, unspecified: Secondary | ICD-10-CM | POA: Diagnosis not present

## 2024-03-02 DIAGNOSIS — E569 Vitamin deficiency, unspecified: Secondary | ICD-10-CM | POA: Diagnosis not present

## 2024-03-02 DIAGNOSIS — R609 Edema, unspecified: Secondary | ICD-10-CM | POA: Diagnosis not present

## 2024-03-02 DIAGNOSIS — A4189 Other specified sepsis: Secondary | ICD-10-CM | POA: Diagnosis not present

## 2024-03-02 DIAGNOSIS — G6289 Other specified polyneuropathies: Secondary | ICD-10-CM | POA: Diagnosis not present

## 2024-03-02 LAB — GLUCOSE, CAPILLARY
Glucose-Capillary: 88 mg/dL (ref 70–99)
Glucose-Capillary: 101 mg/dL — ABNORMAL HIGH (ref 70–99)

## 2024-03-02 LAB — COMPREHENSIVE METABOLIC PANEL WITH GFR
ALT: 21 U/L (ref 0–44)
AST: 26 U/L (ref 15–41)
Albumin: 2.1 g/dL — ABNORMAL LOW (ref 3.5–5.0)
Alkaline Phosphatase: 94 U/L (ref 38–126)
Anion gap: 6 (ref 5–15)
BUN: 16 mg/dL (ref 8–23)
CO2: 26 mmol/L (ref 22–32)
Calcium: 8.9 mg/dL (ref 8.9–10.3)
Chloride: 106 mmol/L (ref 98–111)
Creatinine, Ser: 0.92 mg/dL (ref 0.44–1.00)
GFR, Estimated: >60 mL/min (ref >60)
Glucose, Bld: 91 mg/dL (ref 70–99)
Potassium: 3.5 mmol/L (ref 3.5–5.1)
Sodium: 138 mmol/L (ref 135–145)
Total Bilirubin: 0.8 mg/dL (ref 0.0–1.2)
Total Protein: 5.5 g/dL — ABNORMAL LOW (ref 6.5–8.1)

## 2024-03-02 LAB — CBC
HCT: 26.4 % — ABNORMAL LOW (ref 36.0–46.0)
Hemoglobin: 8.7 g/dL — ABNORMAL LOW (ref 12.0–15.0)
MCH: 32.2 pg (ref 26.0–34.0)
MCHC: 33 g/dL (ref 30.0–36.0)
MCV: 97.8 fL (ref 80.0–100.0)
Platelets: 237 K/uL (ref 150–400)
RBC: 2.7 MIL/uL — ABNORMAL LOW (ref 3.87–5.11)
RDW: 17.3 % — ABNORMAL HIGH (ref 11.5–15.5)
WBC: 24.4 K/uL — ABNORMAL HIGH (ref 4.0–10.5)
nRBC: 0.2 % (ref 0.0–0.2)

## 2024-03-02 LAB — MAGNESIUM: Magnesium: 1.6 mg/dL — ABNORMAL LOW (ref 1.7–2.4)

## 2024-03-02 LAB — PHOSPHORUS: Phosphorus: 4 mg/dL (ref 2.5–4.6)

## 2024-03-02 MED ORDER — LINEZOLID 600 MG PO TABS
600.0000 mg | ORAL_TABLET | Freq: Two times a day (BID) | ORAL | Status: DC
  Administered 2024-03-02 (×2): 600 mg via ORAL
  Filled 2024-03-02 (×2): qty 1

## 2024-03-02 MED ORDER — AMOXICILLIN-POT CLAVULANATE 875-125 MG PO TABS: 1.0000 | ORAL_TABLET | Freq: Two times a day (BID) | ORAL | Status: AC

## 2024-03-02 MED ORDER — AMOXICILLIN-POT CLAVULANATE 875-125 MG PO TABS: 1.0000 | ORAL_TABLET | Freq: Two times a day (BID) | ORAL | Status: DC

## 2024-03-02 MED ORDER — OXYCODONE HCL 5 MG PO TABS: 5.0000 mg | ORAL_TABLET | ORAL | 0 refills | Status: DC | PRN

## 2024-03-02 MED ORDER — FUROSEMIDE 10 MG/ML IJ SOLN
20.0000 mg | Freq: Once | INTRAMUSCULAR | Status: AC
  Administered 2024-03-02 (×2): 20 mg via INTRAVENOUS
  Filled 2024-03-02: qty 4

## 2024-03-02 MED ORDER — LOSARTAN POTASSIUM 25 MG PO TABS
25.0000 mg | ORAL_TABLET | Freq: Every day | ORAL | Status: DC
  Administered 2024-03-02 (×2): 25 mg via ORAL
  Filled 2024-03-02: qty 1

## 2024-03-02 MED ORDER — LINEZOLID 600 MG PO TABS: 600.0000 mg | ORAL_TABLET | Freq: Two times a day (BID) | ORAL | Status: AC

## 2024-03-02 MED ORDER — MAGNESIUM SULFATE 2 GM/50ML IV SOLN
2.0000 g | Freq: Once | INTRAVENOUS | Status: AC
  Administered 2024-03-02 (×2): 2 g via INTRAVENOUS
  Filled 2024-03-02: qty 50

## 2024-03-02 MED ORDER — ACETAMINOPHEN 325 MG PO TABS: 650.0000 mg | ORAL_TABLET | Freq: Four times a day (QID) | ORAL | Status: DC | PRN

## 2024-03-02 NOTE — Telephone Encounter (Signed)
 Patient Product/process development scientist completed.    The patient is insured through [facility]. Patient has Medicare and is not eligible for a copay card, but may be able to apply for patient assistance or Medicare RX Payment Plan (Patient Must reach out to their plan, if eligible for payment plan), if available.    Ran test claim for linezolid  600 mg and the current 5 day co-pay is $49.83.   This test claim was processed through [facility]- copay amounts may vary at other pharmacies due to pharmacy/plan contracts, or as the patient moves through the different stages of their insurance plan.     Reyes Sharps, CPHT Pharmacy Technician III Certified Patient Advocate [facility] Patient Advocate Team Direct Number: [phone]  Fax: [phone]

## 2024-03-02 NOTE — Progress Notes (Signed)
 CC: POD # 7 [email or web address]  Subjective: Doing ok, still weak Working w therapies but significant weakness Ostomy working Responded to lasix   Objective: Vital signs in last 24 hours: Temp:  [97.7 F (36.5 C)-98 F (36.7 C)] 97.8 F (36.6 C) (08/12 0726) Pulse Rate:  [83-93] 91 (08/12 0726) Resp:  [16-21] 18 (08/12 0726) BP: (120-166)/(54-75) 123/62 (08/12 0726) SpO2:  [100 %] 100 % (08/12 0726) Weight:  [86.4 kg] 86.4 kg (08/12 0445) Last BM Date : 03/01/24  Intake/Output from previous day: 08/11 0701 - 08/12 0700 In: 360 [P.O.:360] Out: 3480 [Urine:3450; Drains:30] Intake/Output this shift: Total I/O In: -  Out: 600 [Urine:600]  Physical exam:  NAD chronically ill Abd: soft, incision staples in place, ostomy pink and productive, drain serous  Lab Results: CBC  Recent Labs    03/01/24 0833 03/02/24 0419  WBC 22.9* 24.4*  HGB 9.1* 8.7*  HCT 28.0* 26.4*  PLT 180 237   BMET Recent Labs    03/01/24 0833 03/02/24 0419  NA 139 138  K 3.5 3.5  CL 110 106  CO2 22 26  GLUCOSE 151* 91  BUN 9 16  CREATININE 0.47 0.92  CALCIUM  8.8* 8.9   PT/INR No results for input(s): LABPROT, INR in the last 72 hours. ABG No results for input(s): PHART, HCO3 in the last 72 hours.  Invalid input(s): PCO2, PO2  Studies/Results: No results found.  Anti-infectives: Anti-infectives (From admission, onward)    Start     Dose/Rate Route Frequency Ordered Stop   03/02/24 2200  amoxicillin -clavulanate (AUGMENTIN ) 875-125 MG per tablet 1 tablet        1 tablet Oral Every 12 hours 03/02/24 0731 03/08/24 0959   03/02/24 1000  linezolid  (ZYVOX ) tablet 600 mg        600 mg Oral Every 12 hours 03/02/24 0733 03/08/24 0959   02/29/24 1000  vancomycin  (VANCOCIN ) IVPB 1000 mg/200 mL premix  Status:  Discontinued        1,000 mg 200 mL/hr over 60 Minutes Intravenous Every 24 hours 02/28/24 1207 03/02/24 0733   02/24/24 1200  vancomycin  (VANCOREADY) IVPB 750 mg/150 mL  Status:   Discontinued        750 mg 150 mL/hr over 60 Minutes Intravenous Every 24 hours 02/23/24 1548 02/28/24 1207   02/24/24 1015  cefoTEtan  (CEFOTAN ) 2 g in sodium chloride  0.9 % 100 mL IVPB        2 g 200 mL/hr over 30 Minutes Intravenous  Once 02/24/24 0926 02/24/24 1144   02/23/24 2200  piperacillin -tazobactam (ZOSYN ) IVPB 3.375 g        3.375 g 12.5 mL/hr over 240 Minutes Intravenous Every 8 hours 02/23/24 1548 03/02/24 2159   02/23/24 1230  vancomycin  (VANCOREADY) IVPB 1750 mg/350 mL        1,750 mg 175 mL/hr over 120 Minutes Intravenous  Once 02/23/24 1215 02/23/24 1457   02/23/24 1100  piperacillin -tazobactam (ZOSYN ) IVPB 3.375 g        3.375 g 100 mL/hr over 30 Minutes Intravenous  Once 02/23/24 1055 02/23/24 1159       Assessment/Plan: DOing well A/Bs for 2 weeks total PT/OT May remove drain SNF pending   Laneta Luna, MD, [facility]  03/02/2024

## 2024-03-02 NOTE — TOC Progression Note (Signed)
 Transition of Care [facility]) - Progression Note    Patient Details  Name: SYBELLA HARNISH MRN: [id number] Date of Birth: May 14, 1946  Transition of [facility]) CM/SW Contact  [name], RN Phone Number: 03/02/2024, 10:51 AM  Clinical Narrative:     Met with patient at bedside to present bed offers.  She defers to her son [name] to make decision Bed offers reviewed with maurice by phone He accepts bed at [facility].  Accepted in HUB and notified Tammy at [facility]                     Expected Discharge Plan and Services                                               Social Drivers of Health (SDOH) Interventions SDOH Screenings   Food Insecurity: No Food Insecurity (02/23/2024)  Housing: Low Risk  (02/23/2024)  Transportation Needs: No Transportation Needs (02/23/2024)  Utilities: Not At Risk (02/23/2024)  Alcohol Screen: Low Risk  (08/12/2023)  Depression (PHQ2-9): Low Risk  (02/17/2024)  Financial Resource Strain: Low Risk  (08/12/2023)  Physical Activity: Sufficiently Active (08/12/2023)  Social Connections: Unknown (02/23/2024)  Stress: No Stress Concern Present (08/12/2023)  Tobacco Use: Low Risk  (02/24/2024)  Health Literacy: Adequate Health Literacy (08/12/2023)    Readmission Risk Interventions     No data to display

## 2024-03-02 NOTE — Plan of Care (Signed)

## 2024-03-02 NOTE — Progress Notes (Addendum)
 Physical Therapy Treatment Patient Details Name: [name] MRN: [id number] DOB: 1946/05/23 Today's Date: 03/02/2024   History of Present Illness Patient is a 78 year old female with diverticulitis with worsening abdominal pain. S/p ex-lap with colostomy. PMH: breast cancer currently undergoing treatment, HLD, HTN    PT Comments  Patient is agreeable to PT session and requesting to sit up in the chair. Mild dizziness initially with sitting that subsides quickly. Patient required Min A for bed mobility and CGA for short distance ambulation using rolling walker. Activity tolerance limited by fatigue. Recommend to continue PT to maximize independence and facilitate return to prior level of function. Rehabilitation < 3 hours/day recommended after this hospital stay.    If plan is discharge home, recommend the following: A little help with walking and/or transfers;A little help with bathing/dressing/bathroom;Assist for transportation;Assistance with cooking/housework   Can travel by private vehicle     No  Equipment Recommendations  Rolling walker (2 wheels)    Recommendations for Other Services       Precautions / Restrictions Precautions Precautions: Fall Recall of Precautions/Restrictions: Intact Restrictions Weight Bearing Restrictions Per Provider Order: No     Mobility  Bed Mobility Overal bed mobility: Needs Assistance Bed Mobility: Rolling, Sidelying to Sit Rolling: Contact guard assist Sidelying to sit: Min assist       General bed mobility comments: assistance for trunk support to sit upright. encouraged logroll technique to protect abdominal incision for comfort    Transfers Overall transfer level: Needs assistance Equipment used: Rolling walker (2 wheels) Transfers: Sit to/from Stand Sit to Stand: Min assist           General transfer comment: patient reports initial dizziness with initial sitting that subsides quickly. steadying assistance provided to  stand. education on monitoring for signs of dizziness with mobility for fall prevention    Ambulation/Gait Ambulation/Gait assistance: Contact guard assist Gait Distance (Feet): 5 Feet Assistive device: Rolling walker (2 wheels) Gait Pattern/deviations: Step-through pattern Gait velocity: decreased     General Gait Details: patient declined further ambulation due to fatigue. Sp02 92% on room air after getting to the chair   Stairs             Wheelchair Mobility     Tilt Bed    Modified Rankin (Stroke Patients Only)       Balance Overall balance assessment: Needs assistance Sitting-balance support: Feet supported Sitting balance-Leahy Scale: Good     Standing balance support: Bilateral upper extremity supported, Reliant on assistive device for balance, During functional activity Standing balance-Leahy Scale: Poor Standing balance comment: heavy reliance on rolling walker for support in standing                            Communication Communication Communication: No apparent difficulties  Cognition Arousal: Alert Behavior During Therapy: WFL for tasks assessed/performed   PT - Cognitive impairments: No apparent impairments                         Following commands: Intact      Cueing Cueing Techniques: Verbal cues  Exercises      General Comments General comments (skin integrity, edema, etc.): patient requesting to sit up in the chair at end of session      Pertinent Vitals/Pain Pain Assessment Pain Assessment: No/denies pain    Home Living  Prior Function            PT Goals (current goals can now be found in the care plan section) Acute Rehab PT Goals Patient Stated Goal: rehab then home with son PT Goal Formulation: With patient Time For Goal Achievement: 03/11/24 Potential to Achieve Goals: Good Progress towards PT goals: Progressing toward goals    Frequency    Min  2X/week      PT Plan      Co-evaluation              AM-PAC PT 6 Clicks Mobility   Outcome Measure  Help needed turning from your back to your side while in a flat bed without using bedrails?: A Little Help needed moving from lying on your back to sitting on the side of a flat bed without using bedrails?: A Little Help needed moving to and from a bed to a chair (including a wheelchair)?: A Little Help needed standing up from a chair using your arms (e.g., wheelchair or bedside chair)?: A Little Help needed to walk in hospital room?: A Little Help needed climbing 3-5 steps with a railing? : A Lot 6 Click Score: 17    End of Session   Activity Tolerance: Patient tolerated treatment well Patient left: in chair;with call bell/phone within reach;with chair alarm set   PT Visit Diagnosis: Muscle weakness (generalized) (M62.81);Unsteadiness on feet (R26.81)     Time: 9042-8984 PT Time Calculation (min) (ACUTE ONLY): 18 min  Charges:    $Therapeutic Activity: 8-22 mins PT General Charges $$ ACUTE PT VISIT: 1 Visit                     [name], PT, MPT    [name] 03/02/2024, 10:32 AM

## 2024-03-02 NOTE — TOC Progression Note (Signed)
 Transition of Care [facility]) - Progression Note    Patient Details  Name: [name] MRN: [id number] Date of Birth: [birth year]  Transition of Care [facility]) CM/SW Contact  [name], RN Phone Number: 03/02/2024, 1:41 PM  Clinical Narrative:        Care without delay order noted.  Auth to be initiated for [facility]                  Expected Discharge Plan and Services         Expected Discharge Date: 03/02/24                                     Social Drivers of Health (SDOH) Interventions SDOH Screenings   Food Insecurity: No Food Insecurity (02/23/2024)  Housing: Low Risk  (02/23/2024)  Transportation Needs: No Transportation Needs (02/23/2024)  Utilities: Not At Risk (02/23/2024)  Alcohol Screen: Low Risk  (08/12/2023)  Depression (PHQ2-9): Low Risk  (02/17/2024)  Financial Resource Strain: Low Risk  (08/12/2023)  Physical Activity: Sufficiently Active (08/12/2023)  Social Connections: Unknown (02/23/2024)  Stress: No Stress Concern Present (08/12/2023)  Tobacco Use: Low Risk  (02/24/2024)  Health Literacy: Adequate Health Literacy (08/12/2023)    Readmission Risk Interventions     No data to display

## 2024-03-02 NOTE — Progress Notes (Signed)
 Mobility Specialist - Progress Note   03/02/24 1134  Mobility  Activity Ambulated with assistance  Level of Assistance Contact guard assist, steadying assist  Assistive Device Front wheel walker  Distance Ambulated (ft) 8 ft  Activity Response Tolerated well  Mobility visit 1 Mobility  Mobility Specialist Start Time (ACUTE ONLY) 1018  Mobility Specialist Stop Time (ACUTE ONLY) 1026  Mobility Specialist Time Calculation (min) (ACUTE ONLY) 8 min   Pt sitting in the recliner upon entry, utilizing RA. Pt STS to RW and amb ~ 4 ft forward within the room w/ CGA, before endorsing SOB and fatigue. Pt amb ~ 4 ft backwards towards the recliner before returning seated. Pt left in the recliner with alarm set and needs within reach.   America Silvan Mobility Specialist 03/02/24 11:36 AM

## 2024-03-02 NOTE — Progress Notes (Signed)
 PROGRESS NOTE    [name]  FMW:[id number] DOB: [birth year] DOA: 02/23/2024 PCP: Antonetta Rollene BRAVO, MD    Brief Narrative:  78 y.o. female with medical history significant for breast cancer on treatment, hyperlipidemia, hypertension, who presented to the hospital with abdominal pain.  Was recently discharged from the hospital on 02/03/2024 after hospitalization for acute diverticulitis is now managed with antibiotics.   She was admitted to the hospital for severe sepsis secondary to acute diverticulitis.  She was treated with empiric IV antibiotics.   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 Dr. Jordis, general surgeon, recommends holding chemotherapy and radiation therapy for a few weeks so that patient can go to SNF. Patient will not receive chemotherapy while at skilled nursing  Pancytopenia: Improved Patient now has leukocytosis.  Recent Granix  probably contributing to leukocytosis. S/p transfusion of 1 unit of platelets and transfusion of PRBCs on 02/24/2024       Hypomagnesemia: Monitor and replace as needed Hypokalemia: Improved Hypophosphatemia: Improved     Hypertension: Improved control.  Continue losartan  hydrochlorothiazide     DVT prophylaxis: SQH Code Status: Full Family Communication: None today (TOC spoke to patient's son) Disposition Plan: Status is: Inpatient Remains inpatient appropriate because: Unsafe  discharge plan.  Medically stable for discharge to nursing facility.   Level of care: Telemetry Medical  Consultants:  General Surgery  Procedures:  Hartman's  Antimicrobials: Augmentin  Linezolid    Subjective: Seen and examined.  Sitting up in chair.  Appears fatigued otherwise stable.  Pain well-controlled.  No distress.  No complaints.  Objective: Vitals:   03/01/24 2054 03/02/24 0444 03/02/24 0445 03/02/24 0726  BP: (!) 146/60 (!) 120/54  123/62  Pulse: 93 92  91  Resp: 16 (!) 21  18  Temp: 98 F (36.7 C) 98 F (36.7 C)  97.8 F (36.6 C)  TempSrc:  Oral  Oral  SpO2: 100% 100%  100%  Weight:   86.4 kg   Height:        Intake/Output Summary (Last 24 hours) at 03/02/2024 1251 Last data filed at 03/02/2024 9176 Gross per 24 hour  Intake 120 ml  Output 1695 ml  Net -1575 ml   Filed Weights   02/29/24 0451 03/01/24 0600 03/02/24 0445  Weight: 95.3 kg 87.5 kg 86.4 kg    Examination:  General exam: NAD Respiratory system: Clear to auscultation. Respiratory effort normal. Cardiovascular system: S1-S2, RRR, no murmurs, no pedal edema Gastrointestinal system: Soft, NT/ND, ostomy with brown stool Central nervous system: Alert and oriented. No focal neurological deficits. Extremities: Symmetric 5 x 5 power. Skin: No rashes, lesions or ulcers Psychiatry: Judgement and insight appear normal. Mood & affect appropriate.     Data Reviewed: I have personally reviewed following labs and imaging studies  CBC: Recent Labs  Lab 02/27/24 0515 02/28/24 0511 02/29/24 0510 03/01/24 0833 03/02/24 0419  WBC 36.6* 35.5* 31.4* 22.9* 24.4*  NEUTROABS  --   --   --  18.2*  --   HGB  9.1* 9.2* 8.6* 9.1* 8.7*  HCT 27.7* 28.3* 26.9* 28.0* 26.4*  MCV 95.5 96.9 98.9 98.6 97.8  PLT 121* 145* 155 180 237   Basic Metabolic Panel: Recent Labs  Lab 02/27/24 0515 02/28/24 0511 02/29/24 0510 03/01/24 0833 03/02/24 0419  NA 142 142 139 139 138  K 3.3* 4.0 4.0 3.5 3.5  CL 113*  113* 111 110 106  CO2 21* 21* 21* 22 26  GLUCOSE 96 93 100* 151* 91  BUN 20 19 12 9 16   CREATININE 0.86 0.68 0.53 0.47 0.92  CALCIUM  8.7* 9.1 8.8* 8.8* 8.9  MG 1.9 1.8 1.5* 1.4* 1.6*  PHOS 2.3* 3.3 4.0 3.4 4.0   GFR: Estimated Creatinine Clearance: 50.8 mL/min (by C-G formula based on SCr of 0.92 mg/dL). Liver Function Tests: Recent Labs  Lab 03/02/24 0419  AST 26  ALT 21  ALKPHOS 94  BILITOT 0.8  PROT 5.5*  ALBUMIN  2.1*   No results for input(s): LIPASE, AMYLASE in the last 168 hours. No results for input(s): AMMONIA in the last 168 hours. Coagulation Profile: No results for input(s): INR, PROTIME in the last 168 hours. Cardiac Enzymes: No results for input(s): CKTOTAL, CKMB, CKMBINDEX, TROPONINI in the last 168 hours. BNP (last 3 results) No results for input(s): PROBNP in the last 8760 hours. HbA1C: No results for input(s): HGBA1C in the last 72 hours. CBG: Recent Labs  Lab 03/01/24 1738 03/01/24 2051 03/01/24 2353 03/02/24 0433 03/02/24 0726  GLUCAP 100* 120* 110* 88 101*   Lipid Profile: No results for input(s): CHOL, HDL, LDLCALC, TRIG, CHOLHDL, LDLDIRECT in the last 72 hours. Thyroid  Function Tests: No results for input(s): TSH, T4TOTAL, FREET4, T3FREE, THYROIDAB in the last 72 hours. Anemia Panel: No results for input(s): VITAMINB12, FOLATE, FERRITIN, TIBC, IRON , RETICCTPCT in the last 72 hours. Sepsis Labs: No results for input(s): PROCALCITON, LATICACIDVEN in the last 168 hours.  Recent Results (from the past 240 hours)  Culture, blood (Routine x 2)     Status: None   Collection Time: 02/23/24 10:55 AM   Specimen: BLOOD  Result Value Ref Range Status   Specimen Description BLOOD PORTA CATH LEFT CHEST  Final   Special Requests   Final    BOTTLES DRAWN AEROBIC AND ANAEROBIC Blood Culture results may not be optimal due to an inadequate volume of blood received in culture bottles   Culture    Final    NO GROWTH 5 DAYS Performed at [facility], [address] [place], [place] [address]    Report Status 02/28/2024 FINAL  Final  Culture, blood (Routine x 2)     Status: None   Collection Time: 02/23/24 11:09 AM   Specimen: BLOOD  Result Value Ref Range Status   Specimen Description BLOOD LEFT ANTECUBITAL  Final   Special Requests   Final    BOTTLES DRAWN AEROBIC AND ANAEROBIC Blood Culture results may not be optimal due to an inadequate volume of blood received in culture bottles   Culture   Final    NO GROWTH 5 DAYS Performed at [facility], [address], [place], [place] [address]    Report Status 02/28/2024 FINAL  Final  Urine Culture     Status: None   Collection Time: 02/23/24 11:09 AM   Specimen: Urine, Clean Catch  Result Value Ref Range Status   Specimen Description   Final    URINE, CLEAN CATCH Performed at [facility], [address] [place], [place] [address]  Special Requests   Final    NONE Performed at [facility], [address], [place], [place] [address]    Culture   Final    NO GROWTH Performed at [facility], [address], [place], [place] [address]    Report Status 02/24/2024 FINAL  Final  MRSA Next Gen by PCR, Nasal     Status: Abnormal   Collection Time: 02/23/24  8:22 PM   Specimen: Nasal Mucosa; Nasal Swab  Result Value Ref Range Status   MRSA by PCR Next Gen DETECTED (A) NOT DETECTED Final    Comment: RESULT CALLED TO, READ BACK BY AND VERIFIED WITH: SHANON DOUTHERTY 2226 02/23/24 MU (NOTE) The GeneXpert MRSA Assay (FDA approved for NASAL specimens only), is one component of a comprehensive MRSA colonization surveillance program. It is not intended to diagnose MRSA infection nor to guide or monitor treatment for MRSA infections. Test performance is not FDA approved in patients less than 33 years old. Performed at [facility], [address] [place], [place] [address]    Aerobic/Anaerobic Culture w Gram Stain (surgical/deep wound)     Status: None   Collection Time: 02/24/24 11:20 AM   Specimen: Path fluid; Body Fluid  Result Value Ref Range Status   Specimen Description   Final    ABSCESS Performed at [facility], [address], [place], [place] [address]    Special Requests   Final    NONE Performed at [facility], [address] [place], [place] [address]    Gram Stain NO WBC SEEN RARE GRAM POSITIVE COCCI IN PAIRS   Final   Culture   Final    MODERATE METHICILLIN RESISTANT STAPHYLOCOCCUS AUREUS ABUNDANT ENTEROCOCCUS FAECIUM NO ANAEROBES ISOLATED Performed at [facility], [address], [place], [place] [address]    Report Status 02/29/2024 FINAL  Final   Organism ID, Bacteria METHICILLIN RESISTANT STAPHYLOCOCCUS AUREUS  Final   Organism ID, Bacteria ENTEROCOCCUS FAECIUM  Final      Susceptibility   Enterococcus faecium - MIC*    AMPICILLIN <=2 SENSITIVE Sensitive     VANCOMYCIN  <=0.5 SENSITIVE Sensitive     GENTAMICIN SYNERGY SENSITIVE Sensitive     * ABUNDANT ENTEROCOCCUS FAECIUM   Methicillin resistant staphylococcus aureus - MIC*    CIPROFLOXACIN  >=8 RESISTANT Resistant     ERYTHROMYCIN >=8 RESISTANT Resistant     GENTAMICIN <=0.5 SENSITIVE Sensitive     OXACILLIN >=4 RESISTANT Resistant     TETRACYCLINE >=16 RESISTANT Resistant     VANCOMYCIN  1 SENSITIVE Sensitive     TRIMETH/SULFA <=10 SENSITIVE Sensitive     CLINDAMYCIN RESISTANT Resistant     RIFAMPIN <=0.5 SENSITIVE Sensitive     Inducible Clindamycin POSITIVE Resistant     LINEZOLID  2 SENSITIVE Sensitive     * MODERATE METHICILLIN RESISTANT STAPHYLOCOCCUS AUREUS         Radiology Studies: No results found.      Scheduled Meds:  amoxicillin -clavulanate  1 tablet Oral Q12H   atorvastatin   20 mg Oral Daily   Chlorhexidine  Gluconate Cloth  6 each Topical Daily   feeding supplement  237 mL Oral TID BM   gabapentin   100 mg Oral QHS    heparin  injection (subcutaneous)  5,000 Units Subcutaneous Q12H   hydrochlorothiazide   25 mg Oral Daily   linezolid   600 mg Oral Q12H   losartan   25 mg Oral Daily   melatonin  2.5 mg Oral QHS  metoprolol  succinate  50 mg Oral Daily   multivitamin with minerals  1 tablet Oral Daily   nystatin   5 mL Oral QID   pantoprazole   40 mg Oral Daily   sertraline   50 mg Oral Daily   sodium chloride  flush  10-40 mL Intracatheter Q12H   Continuous Infusions:  piperacillin -tazobactam (ZOSYN )  IV 3.375 g (03/02/24 0604)     LOS: 8 days     Calvin KATHEE Robson, MD [facility]   If 7PM-7AM, please contact night-coverage  03/02/2024, 12:51 PM

## 2024-03-02 NOTE — TOC Transition Note (Signed)
 Transition of Care [facility]) - Discharge Note   Patient Details  Name: [name] MRN: [id number] Date of Birth: [birth year]  [facility]) CM/SW Contact:  [name], RN Phone Number: 03/02/2024, 4:12 PM   Clinical Narrative:     [name] received for [facility]   [facility] will [facility] to: [facility]  Anticipated DC date: 03/02/24  Family notified: Son [name]  Transport by: [facility]  Per MD patient ready for DC to . RN, patient, patient's family, and facility notified of DC. Discharge Summary sent to facility. RN given number for report. DC packet on chart. Ambulance transport requested for patient.   TOC signing off.          Patient Goals and CMS Choice            Discharge Placement                       Discharge Plan and Services Additional resources added to the After Visit Summary for                                       Social Drivers of Health (SDOH) Interventions SDOH Screenings   Food Insecurity: No Food Insecurity (02/23/2024)  Housing: Low Risk  (02/23/2024)  Transportation Needs: No Transportation Needs (02/23/2024)  Utilities: Not At Risk (02/23/2024)  Alcohol Screen: Low Risk  (08/12/2023)  Depression (PHQ2-9): Low Risk  (02/17/2024)  Financial Resource Strain: Low Risk  (08/12/2023)  Physical Activity: Sufficiently Active (08/12/2023)  Social Connections: Unknown (02/23/2024)  Stress: No Stress Concern Present (08/12/2023)  Tobacco Use: Low Risk  (02/24/2024)  Health Literacy: Adequate Health Literacy (08/12/2023)     Readmission Risk Interventions     No data to display

## 2024-03-02 NOTE — Progress Notes (Signed)
 Patient transported via [facility]. Alert and oriented, breathing regular unlabored.

## 2024-03-02 NOTE — Discharge Summary (Signed)
 Physician Discharge Summary  [name] FMW:[id number] DOB: 1945-11-21 DOA: 02/23/2024  PCP: Antonetta Rollene BRAVO, MD  Admit date: 02/23/2024 Discharge date: 03/02/2024  Admitted From: Home Disposition:  SNF  Recommendations for Outpatient Follow-up:  Follow up with PCP in 1-2 weeks Follow up general surgery 1-2 weeks  Home Health:No  Equipment/Devices:None   Discharge Condition:Stable  CODE STATUS:FULL  Diet recommendation: SOFT  Brief/Interim Summary:  78 y.o. female with medical history significant for breast cancer on treatment, hyperlipidemia, hypertension, who presented to the hospital with abdominal pain.  Was recently discharged from the hospital on 02/03/2024 after hospitalization for acute diverticulitis is now managed with antibiotics.   She was admitted to the hospital for severe sepsis secondary to acute diverticulitis.  She was treated with empiric IV antibiotics.    Discharge Diagnoses:  Principal Problem:   Severe sepsis (HCC) Active Problems:   Hyperlipidemia LDL goal <100   Essential hypertension   Acute diverticulitis  Severe sepsis secondary to acute diverticulitis:  S/p open Hartman's procedure, colostomy creation on 02/24/2024 Tolerated procedure well Plan: Continue soft diet on discharge Antibiotics transition to oral Will need additional 7 days, prescribed on discharge As needed analgesia, prescribed on discharge Discharged to skilled nursing facility [facility] Drain removed Follow-up outpatient general surgery 2 weeks   Tachycardia, tachypnea:  improved likely secondary to sepsis     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 Dr. Jordis, general surgeon, recommends holding chemotherapy and radiation therapy for a few weeks so that patient can go to SNF. Patient will not receive chemotherapy while at skilled nursing   Pancytopenia: Improved Patient now has leukocytosis.  Recent Granix  probably contributing to  leukocytosis. S/p transfusion of 1 unit of platelets and transfusion of PRBCs on 02/24/2024       Hypomagnesemia: Improved Hypokalemia: Improved Hypophosphatemia: Improved     Hypertension: Improved control.  Continue current regimen as prescribed on DC [facility]   Discharge Instructions  Discharge Instructions     Diet - low sodium heart healthy   Complete by: As directed    Increase activity slowly   Complete by: As directed    No wound care   Complete by: As directed       Allergies as of 03/02/2024   No Known Allergies      Medication List     STOP taking these medications    ALOXI  IV   CYTOXAN  IJ   lidocaine -prilocaine  cream Commonly known as: EMLA    losartan  100 MG tablet Commonly known as: COZAAR    TAXOTERE  IV       TAKE these medications    albuterol  108 (90 Base) MCG/ACT inhaler Commonly known as: VENTOLIN  HFA Inhale 2 puffs into the lungs every 6 (six) hours as needed for wheezing or shortness of breath.   amLODipine  10 MG tablet Commonly known as: NORVASC  Take 10 mg by mouth daily.   amoxicillin -clavulanate 875-125 MG tablet Commonly known as: AUGMENTIN  Take 1 tablet by mouth every 12 (twelve) hours for 11 doses.   aspirin EC 81 MG tablet Take 81 mg by mouth daily. Swallow whole.   atorvastatin  20 MG tablet Commonly known as: LIPITOR Take 1 tablet (20 mg total) by mouth daily.   BIOTIN PO Take 1 tablet by mouth daily.   bisacodyl  5 MG EC tablet Generic drug: bisacodyl  Take one tablet by mouth every 3 days as needed, for constipation. Repeat once after 6 hours if still no bowel movement   cloNIDine  0.2 MG tablet  Commonly known as: CATAPRES  Take 0.2 mg by mouth daily.   clotrimazole -betamethasone  cream Commonly known as: LOTRISONE  Apply cream twice daily to rash in groin for 10 days, then as needed   docusate sodium  100 MG capsule Commonly known as: Colace Take 1 capsule (100 mg total) by mouth 2 (two) times daily.    famotidine 10 MG tablet Commonly known as: PEPCID Take 10 mg by mouth 2 (two) times daily.   furosemide  40 MG tablet Commonly known as: LASIX  Take 40 mg by mouth daily as needed.   gabapentin  100 MG capsule Commonly known as: NEURONTIN  Take 1 capsule (100 mg total) by mouth at bedtime.   Gerhardt's butt cream Crea Apply 1 application topically 3 (three) times daily.   linezolid  600 MG tablet Commonly known as: ZYVOX  Take 1 tablet (600 mg total) by mouth every 12 (twelve) hours for 6 days.   losartan -hydrochlorothiazide  100-25 MG tablet Commonly known as: HYZAAR Take 1 tablet by mouth daily.   magnesium  30 MG tablet Take one tablet by mouth once daily   megestrol  40 MG/ML suspension Commonly known as: MEGACE  Take 10 mLs (400 mg total) by mouth 2 (two) times daily.   Melatonin 3 MG Caps Take 1 capsule (3 mg total) by mouth at bedtime.   metoprolol  succinate 50 MG 24 hr tablet Commonly known as: TOPROL -XL TAKE 1 TABLET BY MOUTH  DAILY WITH OR IMMEDIATELY  FOLLOWING A MEAL   olopatadine  0.1 % ophthalmic solution Commonly known as: PATANOL Place 1 drop into both eyes 2 (two) times daily.   omeprazole  20 MG capsule Commonly known as: PRILOSEC Take 1 capsule (20 mg total) by mouth daily.   ondansetron  4 MG tablet Commonly known as: Zofran  Take 1 tablet (4 mg total) by mouth daily as needed for nausea or vomiting.   oxyCODONE  5 MG immediate release tablet Commonly known as: Oxy IR/ROXICODONE  Take 1 tablet (5 mg total) by mouth every 4 (four) hours as needed for moderate pain (pain score 4-6). SNF use only   polyethylene glycol powder 17 GM/SCOOP powder Commonly known as: GLYCOLAX /MIRALAX  Take 17 g by mouth 2 (two) times daily as needed.   potassium chloride  SA 20 MEQ tablet Commonly known as: KLOR-CON  M Take 20 mEq by mouth daily.   prochlorperazine  10 MG tablet Commonly known as: COMPAZINE  Take 1 tablet (10 mg total) by mouth every 6 (six) hours as needed for  nausea or vomiting.   pyridOXINE  100 MG tablet Commonly known as: VITAMIN B6 Take 200 mg by mouth daily.   sertraline  50 MG tablet Commonly known as: ZOLOFT  Take 1 tablet (50 mg total) by mouth daily.   temazepam  15 MG capsule Commonly known as: RESTORIL  Take 1 capsule (15 mg total) by mouth at bedtime as needed for sleep.   Vitamin D3 125 MCG (5000 UT) Caps Take 1 capsule (5,000 Units total) by mouth daily.        Contact information for after-discharge care     Destination     [facility] [place], [place]. [name]   Service: Skilled Nursing Contact information: [address] [place]  [address] [phone]                    No Known Allergies  Consultations: General Surgery   Procedures/Studies: [facility] Chest Port 1 View Result Date: 02/25/2024 CLINICAL DATA:  Dyspnea. EXAM: PORTABLE CHEST 1 VIEW COMPARISON:  A 12/09/2023. FINDINGS: Low lung volumes. Stable cardiomediastinal contours. Unchanged left chest port tip overlies  the brachiocephalic vein confluence. Similar bilateral central perihilar interstitial prominence could reflect bronchovascular crowding secondary to hypoinflation or pulmonary vascular congestion. No focal consolidation, sizeable pleural effusion, or pneumothorax. Degenerative changes of the bilateral glenohumeral joints. No acute osseous abnormality. IMPRESSION: Similar bilateral central perihilar interstitial prominence could reflect bronchovascular crowding secondary to hypoinflation or pulmonary vascular congestion. Electronically Signed   By: Harrietta Sherry M.D.   On: 02/25/2024 17:03   DG ABD ACUTE 2+V W 1V CHEST Result Date: 02/24/2024 CLINICAL DATA:  [id number] Diverticulitis large intestine [id number] EXAM: DG ABDOMEN ACUTE WITH 1 VIEW CHEST COMPARISON:  February 23, 2024 FINDINGS: Nonobstructive bowel gas pattern. No pneumoperitoneum. No organomegaly or radiopaque calculi. Low lung volumes with bilateral perihilar interstitial opacities.  Elevation of the right hemidiaphragm. No focal airspace consolidation, pleural effusion, or pneumothorax. Moderate cardiomegaly. Left chest port, catheter terminating in the region of the brachiocephalic vein confluence. Osteopenia. No acute fracture or destructive lesion.Multilevel degenerative disc disease of the spine. IMPRESSION: 1. Nonobstructive bowel gas pattern. 2. Lower lung volumes. Bilateral perihilar interstitial opacities, which may be due to low lung volumes, interstitial edema, or atypical/viral infection. Electronically Signed   By: Rogelia Myers M.D.   On: 02/24/2024 11:00   CT ABDOMEN PELVIS W CONTRAST Result Date: 02/23/2024 EXAM: CT ABDOMEN AND PELVIS WITH CONTRAST 02/23/2024 01:57:47 PM TECHNIQUE: CT of the abdomen and pelvis was performed with the administration of intravenous contrast. Multiplanar reformatted images are provided for review. Automated exposure control, iterative reconstruction, and/or weight based adjustment of the mA/kV was utilized to reduce the radiation dose to as low as reasonably achievable. COMPARISON: 01/30/2024. CLINICAL HISTORY: Eval divertic and complications. Hx recent divertic, worsening bloody/mucous stool, RLQ pain. Patient to ED via POV for abd pain. Having nausea and weakness for the past few days. Had chemo on Tuesday for breast cancer. Hx diverticulitis. FINDINGS: LOWER CHEST: Motion degradation at the lung bases. 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 GALLBLADDER AND BILE DUCTS: Gallbladder is unremarkable. No biliary ductal dilatation. SPLEEN: No acute abnormality. PANCREAS: No acute abnormality. ADRENAL GLANDS: No acute abnormality. KIDNEYS, URETERS AND BLADDER: No hydronephrosis. No perinephric or periureteral stranding. Urinary bladder is unremarkable. GI AND BOWEL: Tiny hiatal hernia. Moderate  inflammation surrounding the sigmoid. pericolic fluid and gas, including at up to 3.5 cm on 66/2. Prominent pericolonic gas adjacent to more proximal sigmoid including on 63/2 . PERITONEUM AND RETROPERITONEUM: No ascites. No free air. VASCULATURE: Advanced abdominal aortic atherosclerosis. LYMPH NODES: No lymphadenopathy. REPRODUCTIVE ORGANS: Hysterectomy. BONES AND SOFT TISSUES: Lumbosacral spondylosis. Abdominal pelvic degradation secondary to patient body habitus and minimal motion. IMPRESSION: 1. Moderate inflammation surrounding the sigmoid, most consistent with diverticulitis. Foci of proximal and distal pericolonic fluid and gas may represent inflamed diverticula or early developing collections. Depending on clinical symptomatology, consider CT follow-up at 3 to 5 days. 2. Mild degradation as detailed above. Electronically signed by: Rockey Kilts MD 02/23/2024 02:23 PM EDT RP Workstation: [id number]   DG Chest Portable 1 View Result Date: 02/23/2024 CLINICAL DATA:  sepsis screen, breast CA patient, likely acute diverticulitis EXAM: PORTABLE CHEST - 1 VIEW COMPARISON:  January 31, 2024 FINDINGS: Left chest port in place terminating at the brachiocephalic vein confluence, unchanged. Elevation of the right hemidiaphragm. No focal airspace consolidation, pleural effusion, or pneumothorax. Mild cardiomegaly. Tortuous aorta with aortic atherosclerosis. No acute fracture or destructive lesions.  Multilevel thoracic osteophytosis. IMPRESSION: Mild cardiomegaly.  No acute cardiopulmonary abnormality. Electronically Signed   By: Rogelia Myers M.D.   On: 02/23/2024 11:47      Subjective: Seen and examined the day of discharge.  Stable no distress.  Appropriate for discharge to skilled nursing facility.  Discharge Exam: Vitals:   03/02/24 0444 03/02/24 0726  BP: (!) 120/54 123/62  Pulse: 92 91  Resp: (!) 21 18  Temp: 98 F (36.7 C) 97.8 F (36.6 C)  SpO2: 100% 100%   Vitals:   03/01/24 2054 03/02/24  0444 03/02/24 0445 03/02/24 0726  BP: (!) 146/60 (!) 120/54  123/62  Pulse: 93 92  91  Resp: 16 (!) 21  18  Temp: 98 F (36.7 C) 98 F (36.7 C)  97.8 F (36.6 C)  TempSrc:  Oral  Oral  SpO2: 100% 100%  100%  Weight:   86.4 kg   Height:        General: Pt is alert, awake, not in acute distress Cardiovascular: RRR, S1/S2 +, no rubs, no gallops Respiratory: CTA bilaterally, no wheezing, no rhonchi Abdominal: Soft, NT, ND, bowel sounds + Extremities: no edema, no cyanosis    The results of significant diagnostics from this hospitalization (including imaging, microbiology, ancillary and laboratory) are listed below for reference.     Microbiology: Recent Results (from the past 240 hours)  Culture, blood (Routine x 2)     Status: None   Collection Time: 02/23/24 10:55 AM   Specimen: BLOOD  Result Value Ref Range Status   Specimen Description BLOOD PORTA CATH LEFT CHEST  Final   Special Requests   Final    BOTTLES DRAWN AEROBIC AND ANAEROBIC Blood Culture results may not be optimal due to an inadequate volume of blood received in culture bottles   Culture   Final    NO GROWTH 5 DAYS Performed at [facility], [address] [place], [place] [address]    Report Status 02/28/2024 FINAL  Final  Culture, blood (Routine x 2)     Status: None   Collection Time: 02/23/24 11:09 AM   Specimen: BLOOD  Result Value Ref Range Status   Specimen Description BLOOD LEFT ANTECUBITAL  Final   Special Requests   Final    BOTTLES DRAWN AEROBIC AND ANAEROBIC Blood Culture results may not be optimal due to an inadequate volume of blood received in culture bottles   Culture   Final    NO GROWTH 5 DAYS Performed at [facility], [address], [place], [place] [address]    Report Status 02/28/2024 FINAL  Final  Urine Culture     Status: None   Collection Time: 02/23/24 11:09 AM   Specimen: Urine, Clean Catch  Result Value Ref Range Status   Specimen Description   Final     URINE, CLEAN CATCH Performed at [facility], [address] [place], [place] [address]    Special Requests   Final    NONE Performed at [facility], [address], [place], [place] [address]    Culture   Final    NO GROWTH Performed at [facility], [address], [place], [place] [address]    Report Status 02/24/2024 FINAL  Final  MRSA Next Gen by PCR, Nasal     Status: Abnormal   Collection Time: 02/23/24  8:22 PM   Specimen: Nasal Mucosa; Nasal Swab  Result Value Ref Range Status   MRSA by PCR Next  Gen DETECTED (A) NOT DETECTED Final    Comment: RESULT CALLED TO, READ BACK BY AND VERIFIED WITH: SHANON DOUTHERTY 2226 02/23/24 MU (NOTE) The GeneXpert MRSA Assay (FDA approved for NASAL specimens only), is one component of a comprehensive MRSA colonization surveillance program. It is not intended to diagnose MRSA infection nor to guide or monitor treatment for MRSA infections. Test performance is not FDA approved in patients less than 15 years old. Performed at [facility], [address] [place], [place] [address]   Aerobic/Anaerobic Culture w Gram Stain (surgical/deep wound)     Status: None   Collection Time: 02/24/24 11:20 AM   Specimen: Path fluid; Body Fluid  Result Value Ref Range Status   Specimen Description   Final    ABSCESS Performed at [facility], [address] [place], [place] [address]    Special Requests   Final    NONE Performed at [facility] [address] [place], [place] [address]    Gram Stain NO WBC SEEN RARE GRAM POSITIVE COCCI IN PAIRS   Final   Culture   Final    MODERATE METHICILLIN RESISTANT STAPHYLOCOCCUS AUREUS ABUNDANT ENTEROCOCCUS FAECIUM NO ANAEROBES ISOLATED Performed at [facility], [address] [place], [place] [address]    Report Status 02/29/2024 FINAL  Final   Organism ID, Bacteria METHICILLIN RESISTANT STAPHYLOCOCCUS AUREUS  Final   Organism ID,  Bacteria ENTEROCOCCUS FAECIUM  Final      Susceptibility   Enterococcus faecium - MIC*    AMPICILLIN <=2 SENSITIVE Sensitive     VANCOMYCIN  <=0.5 SENSITIVE Sensitive     GENTAMICIN SYNERGY SENSITIVE Sensitive     * ABUNDANT ENTEROCOCCUS FAECIUM   Methicillin resistant staphylococcus aureus - MIC*    CIPROFLOXACIN  >=8 RESISTANT Resistant     ERYTHROMYCIN >=8 RESISTANT Resistant     GENTAMICIN <=0.5 SENSITIVE Sensitive     OXACILLIN >=4 RESISTANT Resistant     TETRACYCLINE >=16 RESISTANT Resistant     VANCOMYCIN  1 SENSITIVE Sensitive     TRIMETH/SULFA <=10 SENSITIVE Sensitive     CLINDAMYCIN RESISTANT Resistant     RIFAMPIN <=0.5 SENSITIVE Sensitive     Inducible Clindamycin POSITIVE Resistant     LINEZOLID  2 SENSITIVE Sensitive     * MODERATE METHICILLIN RESISTANT STAPHYLOCOCCUS AUREUS     Labs: BNP (last 3 results) Recent Labs    02/25/24 1320  BNP 312.3*   Basic Metabolic Panel: Recent Labs  Lab 02/27/24 0515 02/28/24 0511 02/29/24 0510 03/01/24 0833 03/02/24 0419  NA 142 142 139 139 138  K 3.3* 4.0 4.0 3.5 3.5  CL 113* 113* 111 110 106  CO2 21* 21* 21* 22 26  GLUCOSE 96 93 100* 151* 91  BUN 20 19 12 9 16   CREATININE 0.86 0.68 0.53 0.47 0.92  CALCIUM  8.7* 9.1 8.8* 8.8* 8.9  MG 1.9 1.8 1.5* 1.4* 1.6*  PHOS 2.3* 3.3 4.0 3.4 4.0   Liver Function Tests: Recent Labs  Lab 03/02/24 0419  AST 26  ALT 21  ALKPHOS 94  BILITOT 0.8  PROT 5.5*  ALBUMIN  2.1*   No results for input(s): LIPASE, AMYLASE in the last 168 hours. No results for input(s): AMMONIA in the last 168 hours. CBC: Recent Labs  Lab 02/27/24 0515 02/28/24 0511 02/29/24 0510 03/01/24 0833 03/02/24 0419  WBC 36.6* 35.5* 31.4* 22.9* 24.4*  NEUTROABS  --   --   --  18.2*  --   HGB 9.1* 9.2* 8.6* 9.1*  8.7*  HCT 27.7* 28.3* 26.9* 28.0* 26.4*  MCV 95.5 96.9 98.9 98.6 97.8  PLT 121* 145* 155 180 237   Cardiac Enzymes: No results for input(s): CKTOTAL, CKMB, CKMBINDEX, TROPONINI in  the last 168 hours. BNP: Invalid input(s): POCBNP CBG: Recent Labs  Lab 03/01/24 1738 03/01/24 2051 03/01/24 2353 03/02/24 0433 03/02/24 0726  GLUCAP 100* 120* 110* 88 101*   D-Dimer No results for input(s): DDIMER in the last 72 hours. Hgb A1c No results for input(s): HGBA1C in the last 72 hours. Lipid Profile No results for input(s): CHOL, HDL, LDLCALC, TRIG, CHOLHDL, LDLDIRECT in the last 72 hours. Thyroid  function studies No results for input(s): TSH, T4TOTAL, T3FREE, THYROIDAB in the last 72 hours.  Invalid input(s): FREET3 Anemia work up No results for input(s): VITAMINB12, FOLATE, FERRITIN, TIBC, IRON , RETICCTPCT in the last 72 hours. Urinalysis    Component Value Date/Time   COLORURINE YELLOW (A) 02/23/2024 1109   APPEARANCEUR CLEAR (A) 02/23/2024 1109   APPEARANCEUR Clear 10/14/2023 1705   LABSPEC >1.046 (H) 02/23/2024 1109   PHURINE 5.0 02/23/2024 1109   GLUCOSEU NEGATIVE 02/23/2024 1109   HGBUR SMALL (A) 02/23/2024 1109   HGBUR trace-intact 11/07/2009 1057   BILIRUBINUR NEGATIVE 02/23/2024 1109   BILIRUBINUR Negative 10/14/2023 1705   KETONESUR NEGATIVE 02/23/2024 1109   PROTEINUR NEGATIVE 02/23/2024 1109   UROBILINOGEN 2.0 10/25/2014 1142   UROBILINOGEN 1.0 11/07/2009 1057   NITRITE NEGATIVE 02/23/2024 1109   LEUKOCYTESUR TRACE (A) 02/23/2024 1109   Sepsis Labs Recent Labs  Lab 02/28/24 0511 02/29/24 0510 03/01/24 0833 03/02/24 0419  WBC 35.5* 31.4* 22.9* 24.4*   Microbiology Recent Results (from the past 240 hours)  Culture, blood (Routine x 2)     Status: None   Collection Time: 02/23/24 10:55 AM   Specimen: BLOOD  Result Value Ref Range Status   Specimen Description BLOOD PORTA CATH LEFT CHEST  Final   Special Requests   Final    BOTTLES DRAWN AEROBIC AND ANAEROBIC Blood Culture results may not be optimal due to an inadequate volume of blood received in culture bottles   Culture   Final    NO GROWTH  5 DAYS Performed at [facility], [address] [place], [place] [address]    Report Status 02/28/2024 FINAL  Final  Culture, blood (Routine x 2)     Status: None   Collection Time: 02/23/24 11:09 AM   Specimen: BLOOD  Result Value Ref Range Status   Specimen Description BLOOD LEFT ANTECUBITAL  Final   Special Requests   Final    BOTTLES DRAWN AEROBIC AND ANAEROBIC Blood Culture results may not be optimal due to an inadequate volume of blood received in culture bottles   Culture   Final    NO GROWTH 5 DAYS Performed at [facility], [address] [place], [place] [address]    Report Status 02/28/2024 FINAL  Final  Urine Culture     Status: None   Collection Time: 02/23/24 11:09 AM   Specimen: Urine, Clean Catch  Result Value Ref Range Status   Specimen Description   Final    URINE, CLEAN CATCH Performed at [facility], [address], [place], [place] [address]    Special Requests   Final    NONE Performed at [facility], [address], [place], [place] [address]    Culture   Final    NO GROWTH Performed at [facility], [address], [place], [place]  [address]    Report Status 02/24/2024 FINAL  Final  MRSA Next Gen by PCR, Nasal     Status: Abnormal   Collection Time: 02/23/24  8:22 PM   Specimen: Nasal Mucosa; Nasal Swab  Result Value Ref Range Status   MRSA by PCR Next Gen DETECTED (A) NOT DETECTED Final    Comment: RESULT CALLED TO, READ BACK BY AND VERIFIED WITH: SHANON DOUTHERTY 2226 02/23/24 MU (NOTE) The GeneXpert MRSA Assay (FDA approved for NASAL specimens only), is one component of a comprehensive MRSA colonization surveillance program. It is not intended to diagnose MRSA infection nor to guide or monitor treatment for MRSA infections. Test performance is not FDA approved in patients less than 99 years old. Performed at [facility], [address] [place], [place] [address]   Aerobic/Anaerobic  Culture w Gram Stain (surgical/deep wound)     Status: None   Collection Time: 02/24/24 11:20 AM   Specimen: Path fluid; Body Fluid  Result Value Ref Range Status   Specimen Description   Final    ABSCESS Performed at [facility], [address] [place], [place] [address]    Special Requests   Final    NONE Performed at [facility], [address] [place], [place] [address]    Gram Stain NO WBC SEEN RARE GRAM POSITIVE COCCI IN PAIRS   Final   Culture   Final    MODERATE METHICILLIN RESISTANT STAPHYLOCOCCUS AUREUS ABUNDANT ENTEROCOCCUS FAECIUM NO ANAEROBES ISOLATED Performed at [facility], [address], [place], [place] [address]    Report Status 02/29/2024 FINAL  Final   Organism ID, Bacteria METHICILLIN RESISTANT STAPHYLOCOCCUS AUREUS  Final   Organism ID, Bacteria ENTEROCOCCUS FAECIUM  Final      Susceptibility   Enterococcus faecium - MIC*    AMPICILLIN <=2 SENSITIVE Sensitive     VANCOMYCIN  <=0.5 SENSITIVE Sensitive     GENTAMICIN SYNERGY SENSITIVE Sensitive     * ABUNDANT ENTEROCOCCUS FAECIUM   Methicillin resistant staphylococcus aureus - MIC*    CIPROFLOXACIN  >=8 RESISTANT Resistant     ERYTHROMYCIN >=8 RESISTANT Resistant     GENTAMICIN <=0.5 SENSITIVE Sensitive     OXACILLIN >=4 RESISTANT Resistant     TETRACYCLINE >=16 RESISTANT Resistant     VANCOMYCIN  1 SENSITIVE Sensitive     TRIMETH/SULFA <=10 SENSITIVE Sensitive     CLINDAMYCIN RESISTANT Resistant     RIFAMPIN <=0.5 SENSITIVE Sensitive     Inducible Clindamycin POSITIVE Resistant     LINEZOLID  2 SENSITIVE Sensitive     * MODERATE METHICILLIN RESISTANT STAPHYLOCOCCUS AUREUS     Time coordinating discharge: 40 minutes  SIGNED:   Calvin KATHEE Robson, MD  [facility] 03/02/2024, 3:02 PM Pager   If 7PM-7AM, please contact night-coverage

## 2024-03-02 NOTE — Progress Notes (Signed)
 Occupational Therapy Treatment Patient Details Name: [name] MRN: [id number] DOB: [birth year] Today's Date: 03/02/2024   History of present illness Patient is a 78 year old female with diverticulitis with worsening abdominal pain. S/p ex-lap with colostomy. PMH: breast cancer currently undergoing treatment, HLD, HTN   OT comments  Upon entering the room, pt seated in recliner chair and agreeable to OT intervention. Pt reports feeling very fatigued and having sat up in recliner chair for several hours and for lunch. Pt stands with min A and takes several steps to bed. Sit >supine with mod A for B LEs. Call bell and all needed items within reach upon exiting the room.       If plan is discharge home, recommend the following:  A little help with walking and/or transfers;Assistance with cooking/housework;Assist for transportation;A lot of help with bathing/dressing/bathroom   Equipment Recommendations  Other (comment) (defer to next venue of care)       Precautions / Restrictions Precautions Precautions: Fall Recall of Precautions/Restrictions: Intact       Mobility Bed Mobility Overal bed mobility: Needs Assistance Bed Mobility: Sit to Supine                Transfers Overall transfer level: Needs assistance Equipment used: Rolling walker (2 wheels) Transfers: Sit to/from Stand Sit to Stand: Min assist                 Balance Overall balance assessment: Needs assistance Sitting-balance support: Feet supported Sitting balance-Leahy Scale: Good     Standing balance support: Bilateral upper extremity supported, Reliant on assistive device for balance, During functional activity Standing balance-Leahy Scale: Poor                             ADL either performed or assessed with clinical judgement    Extremity/Trunk Assessment Upper Extremity Assessment Upper Extremity Assessment: Generalized weakness   Lower Extremity Assessment Lower  Extremity Assessment: Generalized weakness        Vision Patient Visual Report: No change from baseline           Communication Communication Communication: No apparent difficulties   Cognition Arousal: Alert Behavior During Therapy: WFL for tasks assessed/performed Cognition: No apparent impairments                               Following commands: Intact        Cueing   Cueing Techniques: Verbal cues  Exercises              Pertinent Vitals/ Pain       Pain Assessment Pain Assessment: No/denies pain         Frequency  Min 2X/week        Progress Toward Goals  OT Goals(current goals can now be found in the care plan section)  Progress towards OT goals: Progressing toward goals      AM-PAC OT 6 Clicks Daily Activity     Outcome Measure   Help from another person eating meals?: None Help from another person taking care of personal grooming?: None Help from another person toileting, which includes using toliet, bedpan, or urinal?: A Lot Help from another person bathing (including washing, rinsing, drying)?: A Lot Help from another person to put on and taking off regular upper body clothing?: A Little Help from another person to put on and taking off regular lower  body clothing?: A Lot 6 Click Score: 17    End of Session Equipment Utilized During Treatment: Rolling walker (2 wheels)  OT Visit Diagnosis: Unsteadiness on feet (R26.81);Muscle weakness (generalized) (M62.81)   Activity Tolerance Patient tolerated treatment well   Patient Left with call bell/phone within reach;in bed;with bed alarm set   Nurse Communication Mobility status        Time: 8494-8478 OT Time Calculation (min): 16 min  Charges: OT General Charges $OT Visit: 1 Visit OT Treatments $Therapeutic Activity: 8-22 mins  Izetta Claude, MS, OTR/L , CBIS ascom [phone]  03/02/24, 3:56 PM

## 2024-03-03 DIAGNOSIS — G4700 Insomnia, unspecified: Secondary | ICD-10-CM | POA: Diagnosis not present

## 2024-03-05 ENCOUNTER — Encounter: Payer: Self-pay | Admitting: *Deleted

## 2024-03-05 ENCOUNTER — Telehealth: Payer: Self-pay | Admitting: *Deleted

## 2024-03-05 DIAGNOSIS — A419 Sepsis, unspecified organism: Secondary | ICD-10-CM | POA: Diagnosis not present

## 2024-03-05 DIAGNOSIS — D72829 Elevated white blood cell count, unspecified: Secondary | ICD-10-CM | POA: Diagnosis not present

## 2024-03-08 DIAGNOSIS — E876 Hypokalemia: Secondary | ICD-10-CM | POA: Diagnosis not present

## 2024-03-08 DIAGNOSIS — K5792 Diverticulitis of intestine, part unspecified, without perforation or abscess without bleeding: Secondary | ICD-10-CM | POA: Diagnosis not present

## 2024-03-08 DIAGNOSIS — I10 Essential (primary) hypertension: Secondary | ICD-10-CM | POA: Diagnosis not present

## 2024-03-08 DIAGNOSIS — K219 Gastro-esophageal reflux disease without esophagitis: Secondary | ICD-10-CM | POA: Diagnosis not present

## 2024-03-09 ENCOUNTER — Other Ambulatory Visit: Payer: Self-pay

## 2024-03-09 NOTE — Patient Instructions (Signed)
 Visit Information  Thank you for taking time to visit with me today. Please don't hesitate to contact me if I can be of assistance to you before our next scheduled appointment.  Your next care management appointment is by telephone on 03/16/24 at 2pm  Please call the care guide team at [phone] if you need to cancel, schedule, or reschedule an appointment.   Please call 911 if you are experiencing a Mental Health or Behavioral Health Crisis or need someone to talk to.  Tillman Gardener, BSW [place]  [facility], [facility] Social Worker Direct Dial: [phone]  Fax: [phone] Website: [email or web address]

## 2024-03-09 NOTE — Patient Outreach (Signed)
 Complex Care Management   Visit Note  03/09/2024  Name:  [name] MRN: [id number] DOB: 1946-05-06  Situation: Referral received for Complex Care Management related to SDOH Barriers:  Home Health I obtained verbal consent from Caregiver.  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  Son has not been able to address the recommendations provided by SW.  SW referred patient son to connect with Sw at [facility] for other resources. Son no longer plans to apply for Medicaid as he feels patients financial situation would not qualify. Son is reminded to follow up with Home Health providers for cost.  SDOH Interventions    Flowsheet Row Care Coordination from 08/12/2023 in [facility] Community Care Coordination Care Coordination from 05/22/2023 in [facility] Community Care Coordination Care Coordination from 01/07/2023 in [facility] Community Care Coordination Clinical Support from 05/02/2021 in [facility] Care Video Visit from 05/01/2020 in [facility] Primary Care  SDOH Interventions       Food Insecurity Interventions Intervention Not Indicated Intervention Not Indicated Intervention Not Indicated Intervention Not Indicated Intervention Not Indicated  Housing Interventions Intervention Not Indicated Intervention Not Indicated Intervention Not Indicated Intervention Not Indicated Intervention Not Indicated  Transportation Interventions Intervention Not Indicated, Patient Resources (Friends/Family) Intervention Not Indicated, Patient  Resources (Friends/Family), Payor Benefit --  [Patient recently has been restricted from driving, hardship on son/daughter-in-law to take patient to md apt in the future] Intervention Not Indicated Intervention Not Indicated  Utilities Interventions Intervention Not Indicated Intervention Not Indicated Intervention Not Indicated -- --  Alcohol Usage Interventions Intervention Not Indicated (Score <7) Intervention Not Indicated (Score <7) -- -- --  Depression Interventions/Treatment  Medication, Counseling, Currently on Treatment, Community Resources Provided Referral to Psychiatry, Medication, Counseling -- -- --  Financial Strain Interventions Intervention Not Indicated Intervention Not Indicated -- Intervention Not Indicated Intervention Not Indicated  Physical Activity Interventions Intervention Not Indicated, Community Resources Provided Intervention Not Indicated -- Intervention Not Indicated Intervention Not Indicated  Stress Interventions Intervention Not Indicated, Offered [employer], [employer] Provided, Provide Counseling Intervention Not Indicated -- Intervention Not Indicated Intervention Not Indicated  Social Connections Interventions Intervention Not Indicated, Community Resources Provided Intervention Not Indicated -- Intervention Not Indicated Intervention Not Indicated  Health Literacy Interventions Intervention Not Indicated Intervention Not Indicated -- -- --      Recommendation:   None  Follow Up Plan:   Telephone follow up appointment date/time:  03/16/24 at 2pm  Tillman Gardener, BSW [place]  [facility], [facility] Social Worker Direct Dial: [phone]  Fax: [phone] Website: [email or web address]

## 2024-03-10 DIAGNOSIS — R0789 Other chest pain: Secondary | ICD-10-CM | POA: Diagnosis not present

## 2024-03-12 DIAGNOSIS — I10 Essential (primary) hypertension: Secondary | ICD-10-CM | POA: Diagnosis not present

## 2024-03-12 DIAGNOSIS — R0789 Other chest pain: Secondary | ICD-10-CM | POA: Diagnosis not present

## 2024-03-15 ENCOUNTER — Ambulatory Visit: Attending: Radiation Oncology | Admitting: Radiation Oncology

## 2024-03-15 ENCOUNTER — Telehealth: Payer: Self-pay | Admitting: *Deleted

## 2024-03-15 NOTE — Telephone Encounter (Signed)
 Called patient regarding missed consult appointment. Spoke  with son he stated that he would contact us  when his mother was ready to come for radiation treatments.

## 2024-03-16 ENCOUNTER — Other Ambulatory Visit: Payer: Self-pay

## 2024-03-16 NOTE — Patient Instructions (Signed)
 Visit Information  Thank you for taking time to visit with me today. Please don't hesitate to contact me if I can be of assistance to you before our next scheduled appointment.  Your next care management appointment is by telephone on 03/23/24 at 3pm   Please call the care guide team at [phone] if you need to cancel, schedule, or reschedule an appointment.   Please call 911 if you are experiencing a Mental Health or Behavioral Health Crisis or need someone to talk to.  Tillman Gardener, BSW [place]  [facility], [facility] Social Worker Direct Dial: [phone]  Fax: [phone] Website: [email or web address]

## 2024-03-16 NOTE — Progress Notes (Signed)
 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 female was transferred to my care after her prior physician has left.   ASSESSMENT & PLAN:   Assessment & Plan: [name]  is a 78 y.o. 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  She does not want to pursue it at this time considering her functional status. -Patient has germline BRCA2 mutation.  Will qualify for 1 year of adjuvant olaparib.  Usually considered after radiation. - I do not think patient is a candidate for further systemic therapy at this time as she is very deconditioned and functionally limited.  We will approach and starting of maintenance/adjuvant olaparib at her next follow-up. - Will obtain a mammogram in 2 months that will be 6 months from after completion of BCT and then yearly afterwards.  Return to clinic in 3 months with mammogram result. Other polyneuropathy Tingling in the fingers of both hands from carpal tunnel is stable.  No tingling in the legs  reported.   -Continue to monitor for now    The patient understands the plans discussed today and is in agreement with them.  She knows to contact our office if she develops concerns prior to her next appointment.  60 minutes of total time was spent for this patient encounter, including preparation, face-to-face counseling with the patient and coordination of care, physical exam, and documentation of the encounter. > 50% of the time was spent on counseling as documented under my assessment and plan.    [name],acting as a Neurosurgeon for Mickiel Dry, MD.,have documented all relevant documentation on the behalf of Mickiel Dry, MD,as directed by  Mickiel Dry, MD while in the presence of Mickiel Dry, MD.  I, Mickiel Dry MD, have reviewed the above documentation for accuracy and completeness, and I agree with the above.     Mickiel Dry, MD  [facility] [place] - A DEPT OF [facility] [address] [place] [address] Dept: [phone] Dept Fax: [phone]   Orders Placed This Encounter  Procedures   MM DIAG BREAST TOMO BILATERAL    Standing Status:   Future    Expected Date:   06/17/2024    Expiration Date:   03/17/2025    Reason for Exam (SYMPTOM  OR DIAGNOSIS REQUIRED):   breast cancer screening; hx breast cancer    Preferred imaging location?:   [facility]   CBC with Differential    Standing Status:   Future    Expected Date:   06/14/2024  Expiration Date:   09/12/2024   Comprehensive metabolic panel    Standing Status:   Future    Expected Date:   06/14/2024    Expiration Date:   09/12/2024   Iron  and TIBC ([facility] ONLY)    Standing Status:   Future    Expected Date:   06/14/2024    Expiration Date:   09/12/2024   Ferritin    Standing Status:   Future    Expected Date:   06/14/2024    Expiration Date:   09/12/2024   Vitamin B12    Standing Status:    Future    Expected Date:   06/14/2024    Expiration Date:   09/12/2024   Folate    Standing Status:   Future    Expected Date:   06/14/2024    Expiration Date:   09/12/2024     ONCOLOGY HISTORY:   I have reviewed her chart and materials related to her cancer extensively and collaborated history with the patient. 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 In the left breast, no findings suspicious for malignancy. -08/26/2023: Mammogram/ultrasound: 1.8 cm suspicious mass within the lower outer right breast. Two slightly abnormal appearing RIGHT axillary lymph nodes. 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  Triple negative, Ki-67: 30% -09/25/2023: PET scan: Mild metabolic activity associated with the medial RIGHT breast mass consistent with primary breast carcinoma. No evidence of metastatic adenopathy in the RIGHT axilla or central thorax. No evidence distant metastatic disease. 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 0/6 lymph nodes negative, pT2, pN0  -12/04/2023-02/17/2024: dd Taxotere  + Cyclophosphamide . Delayed cycle 4 because of hospital admission for diverticulitis   Current Treatment:  Surveillance  INTERVAL HISTORY:   [name] is here today for follow up. Patient is accompanied by her daughter today.   Since the last visit, patient was admitted to the hospital for sepsis secondary to diverticulitis, had a  complicated course requiring colostomy with a colostomy bag.  The patient is still in a facility doing rehab and gaining strength since her discharge.  Patient completed 4 cycles of chemotherapy on 02/17/2024.  Patient was supposed to go for radiation therapy but considering that she was hospitalized and how she is feeling currently, patient does not want to go at this time.  She reports feeling better since discharge.No current complaints and states she is doing 'about as well as you can expect'. She does not smoke.     I have reviewed the past medical history, past surgical history, social history and family history with the patient and they are unchanged from previous note.  ALLERGIES:  has no known allergies.  MEDICATIONS:  Current Outpatient Medications  Medication Sig Dispense Refill   albuterol  (VENTOLIN  HFA) 108 (90 Base) MCG/ACT inhaler Inhale 2 puffs into the lungs every 6 (six) hours as needed for wheezing or shortness of breath. 8.5 g 1   amLODipine  (NORVASC ) 10 MG tablet Take 10 mg by mouth daily.     aspirin EC 81 MG  tablet Take 81 mg by mouth daily. Swallow whole.     atorvastatin  (LIPITOR) 20 MG tablet Take 1 tablet (20 mg total) by mouth daily. 90 tablet 3   BIOTIN PO Take 1 tablet by mouth daily.     bisacodyl  5 MG EC tablet Take one tablet by mouth every 3 days as needed, for constipation. Repeat once after 6 hours if still no bowel movement 30 tablet 0   Cholecalciferol (VITAMIN D3) 125 MCG (5000 UT) CAPS Take 1 capsule (5,000 Units total) by mouth daily. 30 capsule 11   cloNIDine  (CATAPRES ) 0.2 MG tablet Take 0.2 mg by mouth daily.     clotrimazole -betamethasone  (LOTRISONE ) cream Apply cream twice daily to rash in groin for 10 days, then as needed 45 g 1   docusate sodium  (COLACE) 100 MG capsule Take 1 capsule (100 mg total) by mouth 2 (two) times daily. 60 capsule 2   famotidine (PEPCID) 10 MG tablet Take 10 mg by mouth 2 (two) times daily.     furosemide  (LASIX ) 40 MG  tablet Take 40 mg by mouth daily as needed.     gabapentin  (NEURONTIN ) 100 MG capsule Take 1 capsule (100 mg total) by mouth at bedtime. 30 capsule 3   Hydrocortisone (GERHARDT'S BUTT CREAM) CREA Apply 1 application topically 3 (three) times daily. 1 each 1   losartan -hydrochlorothiazide  (HYZAAR) 100-25 MG tablet Take 1 tablet by mouth daily.     magnesium  30 MG tablet Take one tablet by mouth once daily 30 tablet 2   megestrol  (MEGACE ) 40 MG/ML suspension Take 10 mLs (400 mg total) by mouth 2 (two) times daily. 480 mL 0   Melatonin 3 MG CAPS Take 1 capsule (3 mg total) by mouth at bedtime. 30 capsule 3   metoprolol  succinate (TOPROL -XL) 50 MG 24 hr tablet TAKE 1 TABLET BY MOUTH  DAILY WITH OR IMMEDIATELY  FOLLOWING A MEAL 100 tablet 2   olopatadine  (PATANOL) 0.1 % ophthalmic solution Place 1 drop into both eyes 2 (two) times daily. 5 mL 6   omeprazole  (PRILOSEC) 20 MG capsule Take 1 capsule (20 mg total) by mouth daily. 100 capsule 2   ondansetron  (ZOFRAN ) 4 MG tablet Take 1 tablet (4 mg total) by mouth daily as needed for nausea or vomiting. 30 tablet 1   oxyCODONE  (OXY IR/ROXICODONE ) 5 MG immediate release tablet Take 1 tablet (5 mg total) by mouth every 4 (four) hours as needed for moderate pain (pain score 4-6). SNF use only 10 tablet 0   polyethylene glycol powder (GLYCOLAX /MIRALAX ) 17 GM/SCOOP powder Take 17 g by mouth 2 (two) times daily as needed. 3350 g 3   potassium chloride  SA (KLOR-CON  M) 20 MEQ tablet Take 20 mEq by mouth daily.     prochlorperazine  (COMPAZINE ) 10 MG tablet Take 1 tablet (10 mg total) by mouth every 6 (six) hours as needed for nausea or vomiting. 60 tablet 3   pyridOXINE  (VITAMIN B-6) 100 MG tablet Take 200 mg by mouth daily.     sertraline  (ZOLOFT ) 50 MG tablet Take 1 tablet (50 mg total) by mouth daily. 30 tablet 3   temazepam  (RESTORIL ) 15 MG capsule Take 1 capsule (15 mg total) by mouth at bedtime as needed for sleep. 30 capsule 5   No current  facility-administered medications for this visit.    REVIEW OF SYSTEMS:   Constitutional: Denies fevers, chills or abnormal weight loss Eyes: Denies blurriness of vision Ears, nose, mouth, throat, and face: Denies mucositis or sore throat  Respiratory: Denies cough, dyspnea or wheezes Cardiovascular: Denies palpitation, chest discomfort or lower extremity swelling Gastrointestinal:  Denies nausea, heartburn or change in bowel habits Skin: Denies abnormal skin rashes Lymphatics: Denies new lymphadenopathy or easy bruising Neurological:Denies numbness, tingling or new weaknesses Behavioral/Psych: Mood is stable, no new changes  All other systems were reviewed with the patient and are negative.   VITALS:  There were no vitals taken for this visit.  Wt Readings from Last 3 Encounters:  03/02/24 190 lb 7.6 oz (86.4 kg)  02/17/24 176 lb (79.8 kg)  02/17/24 176 lb (79.8 kg)    There is no height or weight on file to calculate BMI.  Performance status (ECOG): 3 - Symptomatic, >50% confined to bed  PHYSICAL EXAM:   GENERAL:alert, no distress and comfortable, in a wheelchair SKIN: skin color, texture, turgor are normal, no rashes or significant lesions BREAST: Right breast lumpectomy scar present no lumps palpated.  Left breast exam: Normal LYMPH:  no palpable lymphadenopathy in the cervical, axillary or inguinal LUNGS: clear to auscultation and percussion with normal breathing effort HEART: regular rate & rhythm and no murmurs and no lower extremity edema ABDOMEN:abdomen soft, non-tender and normal bowel sounds, colostomy bag present Musculoskeletal:no cyanosis of digits and no clubbing  NEURO: alert & oriented x 3 with fluent speech, no focal motor/sensory deficits  LABORATORY DATA:  I have reviewed the data as listed   Lab Results  Component Value Date   WBC 10.0 03/17/2024   NEUTROABS 6.3 03/17/2024   HGB 9.5 (L) 03/17/2024   HCT 30.1 (L) 03/17/2024   MCV 102.4 (H)  03/17/2024   PLT 235 03/17/2024     Chemistry      Component Value Date/Time   NA 137 03/17/2024 1314   NA 143 06/27/2023 1155   K 4.3 03/17/2024 1314   CL 103 03/17/2024 1314   CO2 24 03/17/2024 1314   BUN 17 03/17/2024 1314   BUN 17 06/27/2023 1155   CREATININE 0.79 03/17/2024 1314   CREATININE 0.78 02/15/2020 0848      Component Value Date/Time   CALCIUM  9.0 03/17/2024 1314   ALKPHOS 69 03/17/2024 1314   AST 21 03/17/2024 1314   ALT 26 03/17/2024 1314   BILITOT 0.6 03/17/2024 1314   BILITOT 0.6 06/27/2023 1155       RADIOGRAPHIC STUDIES: I have personally reviewed the radiological images as listed and agreed with the findings in the report.  None new to review

## 2024-03-16 NOTE — Patient Outreach (Signed)
 Complex Care Management   Visit Note  03/16/2024  Name:  [name] MRN: [id number] DOB: Dec 09, 1945  Situation: Referral received for Complex Care Management related to SDOH Barriers:  Personal care I obtained verbal consent from Caregiver.  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 Son agreed to connect with [facility] this week. Patient will be discharged 8/28 and son expects services will be ready.   SDOH Interventions    Flowsheet Row Care Coordination from 08/12/2023 in [facility] Community Care Coordination Care Coordination from 05/22/2023 in [facility] Community Care Coordination Care Coordination from 01/07/2023 in [facility] Community Care Coordination Clinical Support from 05/02/2021 in [facility] Primary Care Video Visit from 05/01/2020 in [facility] Primary Care  SDOH Interventions       Food Insecurity Interventions Intervention Not Indicated Intervention Not Indicated Intervention Not Indicated Intervention Not Indicated Intervention Not Indicated  Housing Interventions Intervention Not Indicated Intervention Not Indicated Intervention Not Indicated Intervention Not Indicated Intervention Not Indicated  Transportation Interventions Intervention Not Indicated, Patient Resources (Friends/Family) Intervention Not Indicated, Patient Resources (Friends/Family), Payor Benefit --  [Patient recently has been restricted from driving, hardship on son/daughter-in-law to take patient to md apt in the future] Intervention  Not Indicated Intervention Not Indicated  Utilities Interventions Intervention Not Indicated Intervention Not Indicated Intervention Not Indicated -- --  Alcohol Usage Interventions Intervention Not Indicated (Score <7) Intervention Not Indicated (Score <7) -- -- --  Depression Interventions/Treatment  Medication, Counseling, Currently on Treatment, Community Resources Provided Referral to Psychiatry, Medication, Counseling -- -- --  Financial Strain Interventions Intervention Not Indicated Intervention Not Indicated -- Intervention Not Indicated Intervention Not Indicated  Physical Activity Interventions Intervention Not Indicated, Community Resources Provided Intervention Not Indicated -- Intervention Not Indicated Intervention Not Indicated  Stress Interventions Intervention Not Indicated, Offered [employer], [employer] Provided, Provide Counseling Intervention Not Indicated -- Intervention Not Indicated Intervention Not Indicated  Social Connections Interventions Intervention Not Indicated, Community Resources Provided Intervention Not Indicated -- Intervention Not Indicated Intervention Not Indicated  Health Literacy Interventions Intervention Not Indicated Intervention Not Indicated -- -- --      Recommendation:   None  Follow Up Plan:   Telephone follow up appointment date/time:  03/23/24 at 3pm  Tillman Gardener, BSW [place]  [facility], [facility] Social Worker Direct Dial: [phone]  Fax: [phone] Website: [email or web address]

## 2024-03-17 ENCOUNTER — Other Ambulatory Visit: Payer: Self-pay

## 2024-03-17 ENCOUNTER — Inpatient Hospital Stay (HOSPITAL_BASED_OUTPATIENT_CLINIC_OR_DEPARTMENT_OTHER): Admitting: Oncology

## 2024-03-17 ENCOUNTER — Other Ambulatory Visit (HOSPITAL_COMMUNITY): Payer: Self-pay | Admitting: Oncology

## 2024-03-17 ENCOUNTER — Inpatient Hospital Stay

## 2024-03-17 VITALS — BP 108/57 | HR 63 | Temp 96.8°F | Resp 20

## 2024-03-17 DIAGNOSIS — G629 Polyneuropathy, unspecified: Secondary | ICD-10-CM | POA: Insufficient documentation

## 2024-03-17 DIAGNOSIS — K219 Gastro-esophageal reflux disease without esophagitis: Secondary | ICD-10-CM | POA: Diagnosis not present

## 2024-03-17 DIAGNOSIS — Z95828 Presence of other vascular implants and grafts: Secondary | ICD-10-CM

## 2024-03-17 DIAGNOSIS — G6289 Other specified polyneuropathies: Secondary | ICD-10-CM

## 2024-03-17 DIAGNOSIS — Z17421 Hormone receptor negative with human epidermal growth factor receptor 2 negative status: Secondary | ICD-10-CM | POA: Diagnosis not present

## 2024-03-17 DIAGNOSIS — Z171 Estrogen receptor negative status [ER-]: Secondary | ICD-10-CM | POA: Diagnosis not present

## 2024-03-17 DIAGNOSIS — C50311 Malignant neoplasm of lower-inner quadrant of right female breast: Secondary | ICD-10-CM

## 2024-03-17 DIAGNOSIS — Z1501 Genetic susceptibility to malignant neoplasm of breast: Secondary | ICD-10-CM | POA: Diagnosis not present

## 2024-03-17 DIAGNOSIS — Z853 Personal history of malignant neoplasm of breast: Secondary | ICD-10-CM

## 2024-03-17 DIAGNOSIS — C50911 Malignant neoplasm of unspecified site of right female breast: Secondary | ICD-10-CM

## 2024-03-17 DIAGNOSIS — I10 Essential (primary) hypertension: Secondary | ICD-10-CM | POA: Diagnosis not present

## 2024-03-17 DIAGNOSIS — K5792 Diverticulitis of intestine, part unspecified, without perforation or abscess without bleeding: Secondary | ICD-10-CM | POA: Diagnosis not present

## 2024-03-17 LAB — COMPREHENSIVE METABOLIC PANEL WITH GFR
ALT: 26 U/L (ref 0–44)
AST: 21 U/L (ref 15–41)
Albumin: 3 g/dL — ABNORMAL LOW (ref 3.5–5.0)
Alkaline Phosphatase: 69 U/L (ref 38–126)
Anion gap: 10 (ref 5–15)
BUN: 17 mg/dL (ref 8–23)
CO2: 24 mmol/L (ref 22–32)
Calcium: 9 mg/dL (ref 8.9–10.3)
Chloride: 103 mmol/L (ref 98–111)
Creatinine, Ser: 0.79 mg/dL (ref 0.44–1.00)
GFR, Estimated: >60 mL/min (ref >60)
Glucose, Bld: 115 mg/dL — ABNORMAL HIGH (ref 70–99)
Potassium: 4.3 mmol/L (ref 3.5–5.1)
Sodium: 137 mmol/L (ref 135–145)
Total Bilirubin: 0.6 mg/dL (ref 0.0–1.2)
Total Protein: 6.9 g/dL (ref 6.5–8.1)

## 2024-03-17 LAB — CBC WITH DIFFERENTIAL/PLATELET
Abs Immature Granulocytes: 0.03 K/uL (ref 0.00–0.07)
Basophils Absolute: 0.1 K/uL (ref 0.0–0.1)
Basophils Relative: 1 %
Eosinophils Absolute: 0.7 K/uL — ABNORMAL HIGH (ref 0.0–0.5)
Eosinophils Relative: 7 %
HCT: 30.1 % — ABNORMAL LOW (ref 36.0–46.0)
Hemoglobin: 9.5 g/dL — ABNORMAL LOW (ref 12.0–15.0)
Immature Granulocytes: 0 %
Lymphocytes Relative: 22 %
Lymphs Abs: 2.2 K/uL (ref 0.7–4.0)
MCH: 32.3 pg (ref 26.0–34.0)
MCHC: 31.6 g/dL (ref 30.0–36.0)
MCV: 102.4 fL — ABNORMAL HIGH (ref 80.0–100.0)
Monocytes Absolute: 0.8 K/uL (ref 0.1–1.0)
Monocytes Relative: 8 %
Neutro Abs: 6.3 K/uL (ref 1.7–7.7)
Neutrophils Relative %: 62 %
Platelets: 235 K/uL (ref 150–400)
RBC: 2.94 MIL/uL — ABNORMAL LOW (ref 3.87–5.11)
RDW: 16.8 % — ABNORMAL HIGH (ref 11.5–15.5)
WBC: 10 K/uL (ref 4.0–10.5)
nRBC: 0 % (ref 0.0–0.2)

## 2024-03-17 LAB — MAGNESIUM: Magnesium: 1.6 mg/dL — ABNORMAL LOW (ref 1.7–2.4)

## 2024-03-17 NOTE — Progress Notes (Signed)
 Patients port flushed without difficulty.  Good blood return noted with no bruising or swelling noted at site.  Labs drawn per orders.  VSS with discharge and left in satisfactory condition with no s/s of distress noted. All follow ups as scheduled.       Kierre Deines

## 2024-03-17 NOTE — Assessment & Plan Note (Signed)
 Tingling in the fingers of both hands from carpal tunnel is stable.  No tingling in the legs reported.   -Continue to monitor for now

## 2024-03-17 NOTE — Assessment & Plan Note (Signed)
 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  She does not want to pursue it at this time considering her functional status. -Patient has germline BRCA2 mutation.  Will qualify for 1 year of adjuvant olaparib.  Usually considered after radiation. - I do not think patient is a candidate for further systemic therapy at this time as she is very deconditioned and functionally limited.  We will approach and starting of maintenance/adjuvant olaparib at her next follow-up. - Will obtain a mammogram in 2 months that will be 6 months from after completion of BCT and then yearly afterwards.  Return to clinic in 3 months with mammogram result.

## 2024-03-17 NOTE — Patient Instructions (Signed)
 [facility] at [facility] Discharge Instructions   You were seen and examined today by Dr. Davonna.  She reviewed the results of your lab work which are normal/stable.   We will see you back in 3 months. We will repeat lab work and a mammogram prior to this visit.    Return as scheduled.    Thank you for choosing [facility] at [facility] to provide your oncology and hematology care.  To afford each patient quality time with our provider, please arrive at least 15 minutes before your scheduled appointment time.   If you have a lab appointment with the Cancer Center please come in thru the Main Entrance and check in at the main information desk.  You need to re-schedule your appointment should you arrive 10 or more minutes late.  We strive to give you quality time with our providers, and arriving late affects you and other patients whose appointments are after yours.  Also, if you no show three or more times for appointments you may be dismissed from the clinic at the providers discretion.     Again, thank you for choosing [facility].  Our hope is that these requests will decrease the amount of time that you wait before being seen by our physicians.       _____________________________________________________________  Should you have questions after your visit to [facility], please contact our office at [phone] and follow the prompts.  Our office hours are 8:00 a.m. and 4:30 p.m. Monday - Friday.  Please note that voicemails left after 4:00 p.m. may not be returned until the following business day.  We are closed weekends and major holidays.  You do have access to a nurse 24-7, just call the main number to the clinic [phone] and do not press any options, hold on the line and a nurse will answer the phone.    For prescription refill requests, have your pharmacy contact our office and allow 72 hours.    Due to  Covid, you will need to wear a mask upon entering the hospital. If you do not have a mask, a mask will be given to you at the Main Entrance upon arrival. For doctor visits, patients may have 1 support person age 40 or older with them. For treatment visits, patients can not have anyone with them due to social distancing guidelines and our immunocompromised population.

## 2024-03-18 ENCOUNTER — Other Ambulatory Visit: Payer: Self-pay

## 2024-03-18 ENCOUNTER — Other Ambulatory Visit: Payer: Self-pay | Admitting: Family Medicine

## 2024-03-18 DIAGNOSIS — K5792 Diverticulitis of intestine, part unspecified, without perforation or abscess without bleeding: Secondary | ICD-10-CM | POA: Diagnosis not present

## 2024-03-19 DIAGNOSIS — J45909 Unspecified asthma, uncomplicated: Secondary | ICD-10-CM | POA: Diagnosis not present

## 2024-03-19 DIAGNOSIS — K5730 Diverticulosis of large intestine without perforation or abscess without bleeding: Secondary | ICD-10-CM | POA: Diagnosis not present

## 2024-03-19 DIAGNOSIS — G4730 Sleep apnea, unspecified: Secondary | ICD-10-CM | POA: Diagnosis not present

## 2024-03-19 DIAGNOSIS — K219 Gastro-esophageal reflux disease without esophagitis: Secondary | ICD-10-CM | POA: Diagnosis not present

## 2024-03-19 DIAGNOSIS — Z433 Encounter for attention to colostomy: Secondary | ICD-10-CM | POA: Diagnosis not present

## 2024-03-19 DIAGNOSIS — I10 Essential (primary) hypertension: Secondary | ICD-10-CM | POA: Diagnosis not present

## 2024-03-19 DIAGNOSIS — E782 Mixed hyperlipidemia: Secondary | ICD-10-CM | POA: Diagnosis not present

## 2024-03-19 DIAGNOSIS — M549 Dorsalgia, unspecified: Secondary | ICD-10-CM | POA: Diagnosis not present

## 2024-03-19 DIAGNOSIS — Z9981 Dependence on supplemental oxygen: Secondary | ICD-10-CM | POA: Diagnosis not present

## 2024-03-19 DIAGNOSIS — C50611 Malignant neoplasm of axillary tail of right female breast: Secondary | ICD-10-CM | POA: Diagnosis not present

## 2024-03-19 DIAGNOSIS — M170 Bilateral primary osteoarthritis of knee: Secondary | ICD-10-CM | POA: Diagnosis not present

## 2024-03-19 DIAGNOSIS — Z48815 Encounter for surgical aftercare following surgery on the digestive system: Secondary | ICD-10-CM | POA: Diagnosis not present

## 2024-03-23 ENCOUNTER — Telehealth: Payer: Self-pay

## 2024-03-23 ENCOUNTER — Other Ambulatory Visit: Payer: Self-pay

## 2024-03-23 DIAGNOSIS — Z9981 Dependence on supplemental oxygen: Secondary | ICD-10-CM | POA: Diagnosis not present

## 2024-03-23 DIAGNOSIS — C50611 Malignant neoplasm of axillary tail of right female breast: Secondary | ICD-10-CM | POA: Diagnosis not present

## 2024-03-23 DIAGNOSIS — Z48815 Encounter for surgical aftercare following surgery on the digestive system: Secondary | ICD-10-CM | POA: Diagnosis not present

## 2024-03-23 DIAGNOSIS — M549 Dorsalgia, unspecified: Secondary | ICD-10-CM | POA: Diagnosis not present

## 2024-03-23 DIAGNOSIS — K5730 Diverticulosis of large intestine without perforation or abscess without bleeding: Secondary | ICD-10-CM | POA: Diagnosis not present

## 2024-03-23 DIAGNOSIS — Z433 Encounter for attention to colostomy: Secondary | ICD-10-CM | POA: Diagnosis not present

## 2024-03-23 DIAGNOSIS — E782 Mixed hyperlipidemia: Secondary | ICD-10-CM | POA: Diagnosis not present

## 2024-03-23 DIAGNOSIS — G4730 Sleep apnea, unspecified: Secondary | ICD-10-CM | POA: Diagnosis not present

## 2024-03-23 DIAGNOSIS — K219 Gastro-esophageal reflux disease without esophagitis: Secondary | ICD-10-CM | POA: Diagnosis not present

## 2024-03-23 DIAGNOSIS — M170 Bilateral primary osteoarthritis of knee: Secondary | ICD-10-CM | POA: Diagnosis not present

## 2024-03-23 DIAGNOSIS — J45909 Unspecified asthma, uncomplicated: Secondary | ICD-10-CM | POA: Diagnosis not present

## 2024-03-23 DIAGNOSIS — I10 Essential (primary) hypertension: Secondary | ICD-10-CM | POA: Diagnosis not present

## 2024-03-23 NOTE — Patient Outreach (Signed)
 Complex Care Management   Visit Note  03/23/2024  Name:  [name] MRN: [id number] DOB: [birth year]  Situation: Referral received for Complex Care Management related to SDOH Barriers:  Personal care I obtained verbal consent from Caregiver.  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 Patient has settled in at home well after rehab. [facility] will contact [facility] to address additional needs in the future. Goal completed.  SDOH Interventions    Flowsheet Row Care Coordination from 08/12/2023 in [facility] Care Coordination Care Coordination from 05/22/2023 in [facility] Community Care Coordination Care Coordination from 01/07/2023 in [facility] Community Care Coordination Clinical Support from 05/02/2021 in [facility] Primary Care Video Visit from 05/01/2020 in [facility] Primary Care  SDOH Interventions       Food Insecurity Interventions Intervention Not Indicated Intervention Not Indicated Intervention Not Indicated Intervention Not Indicated Intervention Not Indicated  Housing Interventions Intervention Not Indicated Intervention Not Indicated Intervention Not Indicated Intervention Not Indicated Intervention Not Indicated  Transportation Interventions Intervention Not Indicated, Patient Resources (Friends/Family) Intervention Not Indicated, Patient Resources (Friends/Family), Payor Benefit --  [Patient recently has been restricted from driving, hardship on son/daughter-in-law to take patient  to md apt in the future] Intervention Not Indicated Intervention Not Indicated  Utilities Interventions Intervention Not Indicated Intervention Not Indicated Intervention Not Indicated -- --  Alcohol Usage Interventions Intervention Not Indicated (Score <7) Intervention Not Indicated (Score <7) -- -- --  Depression Interventions/Treatment  Medication, Counseling, Currently on Treatment, Community Resources Provided Referral to Psychiatry, Medication, Counseling -- -- --  Financial Strain Interventions Intervention Not Indicated Intervention Not Indicated -- Intervention Not Indicated Intervention Not Indicated  Physical Activity Interventions Intervention Not Indicated, Community Resources Provided Intervention Not Indicated -- Intervention Not Indicated Intervention Not Indicated  Stress Interventions Intervention Not Indicated, Offered [employer], [employer] Provided, Provide Counseling Intervention Not Indicated -- Intervention Not Indicated Intervention Not Indicated  Social Connections Interventions Intervention Not Indicated, Community Resources Provided Intervention Not Indicated -- Intervention Not Indicated Intervention Not Indicated  Health Literacy Interventions Intervention Not Indicated Intervention Not Indicated -- -- --      Recommendation:   None  Follow Up Plan:   Patient has met all care management goals. Care Management case will be closed. Patient has been provided contact information should new needs arise.   Tillman Gardener, BSW   [facility], [facility] Social Worker Direct Dial: [phone]  Fax: [phone] Website: [email or web address]

## 2024-03-23 NOTE — Telephone Encounter (Signed)
 Copied from CRM #[id number]. Topic: General - Other >> Mar 23, 2024 11:39 AM Sophia H wrote: Reason for CRM: Patients son calling on behalf of the patient, requesting to speak with Daphne ? Declined to provide info on what call is about. Please reach out # [phone]

## 2024-03-23 NOTE — Patient Instructions (Signed)

## 2024-03-24 ENCOUNTER — Telehealth: Payer: Self-pay

## 2024-03-24 NOTE — Telephone Encounter (Signed)
 See other note

## 2024-03-24 NOTE — Telephone Encounter (Signed)
 Sent, pt son informed

## 2024-03-24 NOTE — Telephone Encounter (Signed)
 Copied from CRM #[id number]. Topic: General - Other >> Mar 23, 2024 11:39 AM Sophia H wrote: Reason for CRM: Patients son calling on behalf of the patient, requesting to speak with Daphne ? Declined to provide info on what call is about. Please reach out # [phone] >> Mar 24, 2024  2:12 PM Montie POUR wrote: [name] is calling back. He would like for Brandi and Dr. Antonetta to send an order for a bedside commode and rotator for [facility]. Please send order to [employer] and have them call son at [phone] for delivery. Thanks

## 2024-03-26 DIAGNOSIS — M549 Dorsalgia, unspecified: Secondary | ICD-10-CM | POA: Diagnosis not present

## 2024-03-26 DIAGNOSIS — K219 Gastro-esophageal reflux disease without esophagitis: Secondary | ICD-10-CM | POA: Diagnosis not present

## 2024-03-26 DIAGNOSIS — C50611 Malignant neoplasm of axillary tail of right female breast: Secondary | ICD-10-CM | POA: Diagnosis not present

## 2024-03-26 DIAGNOSIS — E782 Mixed hyperlipidemia: Secondary | ICD-10-CM | POA: Diagnosis not present

## 2024-03-26 DIAGNOSIS — G4730 Sleep apnea, unspecified: Secondary | ICD-10-CM | POA: Diagnosis not present

## 2024-03-26 DIAGNOSIS — Z48815 Encounter for surgical aftercare following surgery on the digestive system: Secondary | ICD-10-CM | POA: Diagnosis not present

## 2024-03-26 DIAGNOSIS — Z433 Encounter for attention to colostomy: Secondary | ICD-10-CM | POA: Diagnosis not present

## 2024-03-26 DIAGNOSIS — M170 Bilateral primary osteoarthritis of knee: Secondary | ICD-10-CM | POA: Diagnosis not present

## 2024-03-26 DIAGNOSIS — J45909 Unspecified asthma, uncomplicated: Secondary | ICD-10-CM | POA: Diagnosis not present

## 2024-03-26 DIAGNOSIS — K5730 Diverticulosis of large intestine without perforation or abscess without bleeding: Secondary | ICD-10-CM | POA: Diagnosis not present

## 2024-03-26 DIAGNOSIS — Z9981 Dependence on supplemental oxygen: Secondary | ICD-10-CM | POA: Diagnosis not present

## 2024-03-26 DIAGNOSIS — I10 Essential (primary) hypertension: Secondary | ICD-10-CM | POA: Diagnosis not present

## 2024-03-31 ENCOUNTER — Telehealth (INDEPENDENT_AMBULATORY_CARE_PROVIDER_SITE_OTHER)

## 2024-03-31 DIAGNOSIS — Z9981 Dependence on supplemental oxygen: Secondary | ICD-10-CM | POA: Diagnosis not present

## 2024-03-31 DIAGNOSIS — Z933 Colostomy status: Secondary | ICD-10-CM

## 2024-03-31 DIAGNOSIS — J45909 Unspecified asthma, uncomplicated: Secondary | ICD-10-CM | POA: Diagnosis not present

## 2024-03-31 DIAGNOSIS — R269 Unspecified abnormalities of gait and mobility: Secondary | ICD-10-CM

## 2024-03-31 DIAGNOSIS — R5381 Other malaise: Secondary | ICD-10-CM | POA: Diagnosis not present

## 2024-03-31 DIAGNOSIS — Z48815 Encounter for surgical aftercare following surgery on the digestive system: Secondary | ICD-10-CM | POA: Diagnosis not present

## 2024-03-31 DIAGNOSIS — E782 Mixed hyperlipidemia: Secondary | ICD-10-CM | POA: Diagnosis not present

## 2024-03-31 DIAGNOSIS — G4730 Sleep apnea, unspecified: Secondary | ICD-10-CM | POA: Diagnosis not present

## 2024-03-31 DIAGNOSIS — I10 Essential (primary) hypertension: Secondary | ICD-10-CM | POA: Diagnosis not present

## 2024-03-31 DIAGNOSIS — K5730 Diverticulosis of large intestine without perforation or abscess without bleeding: Secondary | ICD-10-CM | POA: Diagnosis not present

## 2024-03-31 DIAGNOSIS — M170 Bilateral primary osteoarthritis of knee: Secondary | ICD-10-CM | POA: Diagnosis not present

## 2024-03-31 DIAGNOSIS — K5792 Diverticulitis of intestine, part unspecified, without perforation or abscess without bleeding: Secondary | ICD-10-CM

## 2024-03-31 DIAGNOSIS — C50611 Malignant neoplasm of axillary tail of right female breast: Secondary | ICD-10-CM | POA: Diagnosis not present

## 2024-03-31 DIAGNOSIS — Z433 Encounter for attention to colostomy: Secondary | ICD-10-CM | POA: Diagnosis not present

## 2024-03-31 DIAGNOSIS — K219 Gastro-esophageal reflux disease without esophagitis: Secondary | ICD-10-CM | POA: Diagnosis not present

## 2024-03-31 DIAGNOSIS — M549 Dorsalgia, unspecified: Secondary | ICD-10-CM | POA: Diagnosis not present

## 2024-03-31 NOTE — Assessment & Plan Note (Signed)
 Colostomy placed during recent hospitalization for acute diverticulitis.  Advised to call general surgeon for follow-up appointment.  Family members will call today for appointment.

## 2024-03-31 NOTE — Progress Notes (Signed)
   Virtual Visit via Video Note  I connected with CHIZARAM LATINO on 03/31/24 at 11:00 AM EDT by a video enabled telemedicine application and verified that I am speaking with the correct person using two identifiers.  Patient Location: Home Provider Location: Office/Clinic  I discussed the limitations, risks, security, and privacy concerns of performing an evaluation and management service by video and the availability of in person appointments. I also discussed with the patient that there may be a patient responsible charge related to this service. The patient expressed understanding and agreed to proceed.  Subjective: PCP: Antonetta Rollene BRAVO, MD  Chief Complaint  Patient presents with   Medical Management of Chronic Issues    D/C rehab   HPI 78 y.o. female with medical history significant for breast cancer on treatment, hyperlipidemia, hypertension, who presented to the hospital with abdominal pain.  Was recently discharged from the hospital on 02/03/2024 after hospitalization for acute diverticulitis is now managed with antibiotics.   She was admitted to the hospital for severe sepsis secondary to acute diverticulitis.  She was treated with empiric IV antibiotics  ROS: Per HPI  Observations/Objective: There were no vitals filed for this visit. Physical Exam  Assessment and Plan: Colostomy status [facility]) Assessment & Plan: She has home health nurse coming out to her home today to assist with colostomy bag and issues related to leaking.  Order placed for bedside commode to assist patient with other toileting needs as she remains weak from recent hospitalization and stay at SNF.  Orders: -     Ambulatory referral to General Surgery  Gait disturbance Assessment & Plan: Patient recently discharged from skilled nursing facility after recent hospitalization for sepsis due to acute diverticulitis.  She is back in her home, but still remains weak from recent hospitalization and  illness.  Order for rolling walker placed so that she may carry out ADLs.   Orders: -     For home use only DME 4 wheeled rolling walker with seat -     For home use only DME Bedside commode  Debility Assessment & Plan: Patient recently discharged from skilled nursing facility after recent hospitalization for sepsis due to acute diverticulitis.  She is back in her home, but still remains weak from recent hospitalization and illness.  Order for rolling walker placed so that she may carry out ADLs.  Orders: -     For home use only DME 4 wheeled rolling walker with seat -     For home use only DME Bedside commode  Acute diverticulitis Assessment & Plan: Colostomy placed during recent hospitalization for acute diverticulitis.  Advised to call general surgeon for follow-up appointment.  Family members will call today for appointment.   Follow Up Instructions: No follow-ups on file.   I discussed the assessment and treatment plan with the patient. The patient was provided an opportunity to ask questions, and all were answered. The patient agreed with the plan and demonstrated an understanding of the instructions.   The patient was advised to call back or seek an in-person evaluation if the symptoms worsen or if the condition fails to improve as anticipated.  The above assessment and management plan was discussed with the patient. The patient verbalized understanding of and has agreed to the management plan.   Leita Longs, FNP

## 2024-03-31 NOTE — Assessment & Plan Note (Signed)
 Patient recently discharged from skilled nursing facility after recent hospitalization for sepsis due to acute diverticulitis.  She is back in her home, but still remains weak from recent hospitalization and illness.  Order for rolling walker placed so that she may carry out ADLs.

## 2024-03-31 NOTE — Assessment & Plan Note (Signed)
 She has home health nurse coming out to her home today to assist with colostomy bag and issues related to leaking.  Order placed for bedside commode to assist patient with other toileting needs as she remains weak from recent hospitalization and stay at SNF.

## 2024-03-31 NOTE — Assessment & Plan Note (Deleted)
 Colostomy in place due to recent diverticulitis

## 2024-04-02 DIAGNOSIS — C50611 Malignant neoplasm of axillary tail of right female breast: Secondary | ICD-10-CM | POA: Diagnosis not present

## 2024-04-05 ENCOUNTER — Ambulatory Visit (INDEPENDENT_AMBULATORY_CARE_PROVIDER_SITE_OTHER): Admitting: Surgery

## 2024-04-05 ENCOUNTER — Encounter: Payer: Self-pay | Admitting: Surgery

## 2024-04-05 VITALS — BP 139/76 | HR 102 | Temp 97.7°F | Ht 60.0 in | Wt 165.0 lb

## 2024-04-05 DIAGNOSIS — K5720 Diverticulitis of large intestine with perforation and abscess without bleeding: Secondary | ICD-10-CM

## 2024-04-05 DIAGNOSIS — Z933 Colostomy status: Secondary | ICD-10-CM

## 2024-04-05 NOTE — Progress Notes (Unsigned)
 Surgical Consultation  04/05/2024  [name] is an 78 y.o. female.   Chief Complaint  Patient presents with  . Routine Post Op     HPI: [name] is 6 weeks from hartmann's recent chemo for breast CA  Past Medical History:  Diagnosis Date  . Asthma   . Back pain   . 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 Depression   . Diverticulosis of colon   . GERD (gastroesophageal reflux disease)   . Hyperlipidemia   . Hypertension   . Obesity   . Osteoarthritis of knees, bilateral   . Sleep apnea     Past Surgical History:  Procedure Laterality Date  . ABDOMINAL HYSTERECTOMY    . AXILLARY SENTINEL NODE BIOPSY Right 10/22/2023   Procedure: BIOPSY, LYMPH NODE, SENTINEL, AXILLARY;  Surgeon: Evonnie Craven A, DO;  Location: [facility];  Service: General;  Laterality: Right;  . BREAST BIOPSY Right 09/09/2023   US  RT BREAST BX W LOC DEV 1ST LESION IMG BX SPEC US  GUIDE 09/09/2023 Lennon Nest, MD [facility]  . BREAST LUMPECTOMY WITH RADIO FREQUENCY LOCALIZER Right 10/22/2023   Procedure: BREAST LUMPECTOMY WITH RADIO FREQUENCY LOCALIZER;  Surgeon: Evonnie Craven LABOR, DO;  Location: [facility];  Service: General;  Laterality: Right;  . COLECTOMY WITH COLOSTOMY CREATION/HARTMANN PROCEDURE N/A 02/24/2024   Procedure: COLECTOMY, WITH COLOSTOMY CREATION;  Surgeon: Jordis Mallory FALCON, MD;  Location: [facility] ORS;  Service: General;  Laterality: N/A;  . COLONOSCOPY  07/23/2003   Dr. Keven Smith:numerous large scattered diverticula  . COLONOSCOPY N/A 11/15/2013   Dr. Harvey: moderate diverticula, small internal hemorrhoids, redudant colon. Next TCS in 2025 with overtube.   . ESOPHAGOGASTRODUODENOSCOPY (EGD) WITH ESOPHAGEAL DILATION N/A 11/15/2013   Dr. Harvey: stricture at GE junction s/p dilation. moderate erosive gastritis, negative H.pylori  . MULTIPLE TOOTH EXTRACTIONS  2024  . PORTACATH PLACEMENT Left 10/22/2023   Procedure: INSERTION, TUNNELED CENTRAL VENOUS DEVICE, WITH PORT;  Surgeon:  Evonnie Craven LABOR, DO;  Location: [facility];  Service: General;  Laterality: Left;  . TOTAL KNEE ARTHROPLASTY  06/01/2004   left / Dr. Margrette  . TOTAL KNEE ARTHROPLASTY  11/29/2003   right / Dr. Margrette  . TUBAL LIGATION      Family History  Problem Relation Age of Onset  . Diabetes Mother   . Hypertension Mother   . Heart disease Mother   . Aneurysm Father   . Diabetes Father   . Hypertension Brother   . Diabetes Brother   . Diabetes Brother   . Diabetes Brother   . Hypertension Brother   . Breast cancer Maternal Aunt        dx >50  . Cancer Maternal Aunt        unknown type; ? breast; dx >50?  [name] Prostate cancer Maternal Uncle   . Hypertension Son     Social History:  reports that she has never smoked. She has never been exposed to tobacco smoke. She has never used smokeless tobacco. She reports that she does not drink alcohol and does not use drugs.  Allergies: No Known Allergies  Medications reviewed.     ROS Full ROS performed and is otherwise negative other than what is stated in the HPI    BP 139/76   Pulse (!) 102   Temp 97.7 F (36.5 C) (Oral)   Ht 5' (1.524 m)   Wt 165 lb (74.8 kg)   SpO2 96%   BMI 32.22 kg/m   Physical Exam  No results found for this or any previous visit (from the past 48 hours). No results found.  Assessment/Plan:    Mallory Luna, MD [facility] General Surgeon

## 2024-04-05 NOTE — Patient Instructions (Addendum)
 We referred you to the ostomy clinic and they will call you to set up an appointment with them. Increase your calories and protein. You need to walk everyday to get your strength up.  You may take a shower, you can take the colostomy bag off and clean the whole area with soap and water . Once you are done, just pat dry and place a new one on.    Please call the office if you have any questions or concerns please don't hesitate to call the office.   We will see you back in 2 months  Colostomy Home Guide, Adult  A colostomy is a surgically created opening (ostomy) that brings a piece of the large intestine through an opening in the abdomen to create a stoma. The stoma allows stool (feces) and gas (flatus) to leave the body. A stoma may be a variety of shapes and sizes. An ostomy pouch or bag is used to cover the stoma and to contain the stool and gas. A colostomy may be temporary or permanent, depending on the medical reason for your surgery. After surgery, you will need to learn to care for your ostomy. This includes emptying and changing the colostomy bag as needed. There are many different types of bags or pouching options, including one-piece, two-piece, clear, fabric covered, flat or curved (convex), drainable, and closed or vented options. Your health care provider will help you select the best pouch for you. How to care for the stoma Your stoma should look pink, red, and moist, like the inside of your cheek. It should not be painful to touch, but it may bleed easily when rubbed or bumped. Soon after surgery, the stoma may be swollen, but this goes away within 6 weeks. To care for the stoma: Keep the skin around the stoma clean and dry. Use a clean, soft washcloth to gently wash the stoma and the skin around it. Clean using a circular motion, and wipe away from the stoma opening, nottoward it. Use warm water  and only use cleansers, such as mild soap and stoma-safe adhesive remover, recommended by  your health care provider. Inspect and dry the skin around the stoma. Use stoma powder or skin protectant on your skin as told by your health care provider. Do not use any other powders, gels, wipes, or creams on the skin around the stoma. These may keep pouch adhesive from sticking or may cause injury to your stoma. Check the stoma area every day for signs of infection or problems. Check for: New or worsening redness, warmth, swelling, irritation, itching, or pain around the stoma. New or worsening changes to the stoma, such as bleeding, trauma, or a dark and dusky color. Changes to the drainage from the stoma, such as pus, blood, a large amount of liquid drainage, or little to no stool drainage. Measure the stoma opening regularly and record the size. Watch for changes, such as the stoma getting longer, becoming flat, or falling below skin level. (It is normal for the stoma to get smaller as the swelling goes away.) Share this information with your health care provider. Changes in the size or shape of the stoma may require a different style of pouch to be used. How to empty the colostomy bag     Empty your bag at bedtime, before physical activity or sexual relations, and whenever it is one-third to one-half full. Do not let the bag get more than half-full with stool or gas. The bag could leak if it gets  too full. Some colostomy bags have a built-in gas release valve that releases gas often throughout the day. Follow these basic steps for emptying a drainable pouch: Prepare the toilet or container: If draining directly into the toilet, put several pieces of toilet paper into the toilet water . This will prevent splashing as you empty the stool into the toilet. Sit far back on the toilet seat. If draining into a container, place a few pieces of moistened toilet paper in the bottom of the container. This can help when emptying the container. Remove the clip or the hook-and-loop fastener from the tail  end of the bag. Unroll the tail, then empty the stool into the toilet or container. Clean the tail with toilet paper or a moist towelette. Reroll the tail, and close it with the clip or the hook-and-loop fastener. Wash your hands. How to change the colostomy bag Change your bag every 3-4 days or as often as told by your health care provider. Also change the bag if it is leaking or separating from the skin, or if your skin around the stoma looks or feels irritated. Irritated skin may be a sign that the bag is leaking. Always have colostomy supplies with you, and follow these basic steps: Have paper towels or tissues and a trash bag nearby to clean any discharge and dispose of the soiled pouch. Remove the old pouching appliance. Use your fingers, a warm, moist cloth, or a stoma-safe adhesive remover to gently remove the pouch and remove adhesive residue. Clean the stoma area with water  or with mild soap and water , as directed. Use water  to rinse away any soap. Dry the skin. You may use the cool setting on a hair dryer to do this. Use a tracing pattern (template) to cut the skin barrier to the size needed. The opening should be large enough to fit around the stoma, but you should not see exposed skin. If you are using a two-piece bag, attach the bag and the base to each other. Add the barrier ring, if you use one. If directed, apply stoma powder or skin protectant to the skin. Warm the pouch base with your hands or blow with a hair dryer for 5-10 seconds. Remove the paper from the adhesive backing of the pouch base. Press the adhesive backing onto the skin around the stoma. Gently rub the pouch base onto the skin. This creates heat that helps the barrier to stick. Apply barrier strips to the edges of the pouch, if desired. Dispose of the soiled pouch, and wash your hands. General recommendations Avoid wearing tight clothes or having anything press directly on your stoma or bag. Change your  clothing whenever it is soiled or damp. You may shower or bathe with the bag on or off. Do not use harsh or oily soaps or lotions. Dry the skin and bag after bathing. Do not shower or bathe right after changing the pouch. Wait 4-5 hours. Store all supplies in a cool, dry place. Do not leave supplies in extreme heat because some parts can melt or not stick as well. Whenever you leave home, take extra clothing and an extra ostomy pouch with you. If your bag gets wet, you can dry it with a hair dryer on the cool setting. To prevent odor, you may put drops of ostomy deodorizer in the bag. If recommended by your health care provider, put ostomy lubricant inside the bag. This helps stool slide out of the bag more easily and completely. Contact a  health care provider if: You have new or worsening redness, warmth, swelling, irritation, itching, or pain around the stoma. You have new or worsening changes to the stoma, such as bleeding, trauma, or a dark and dusky color. There are changes to the drainage from the stoma, such as pus, blood, a large amount of liquid drainage, or little to no stool drainage. Your stoma extends in or out farther than normal. You need to change your bag every day or have frequent leaks. You have a fever. Get help right away if: Your stool is bloody. You have nausea or you vomit. You have trouble breathing. These symptoms may be an emergency. Get help right away. Call 911. Do not wait to see if the symptoms will go away. Do not drive yourself to the hospital. Summary Measure your stoma opening regularly and record the size. Watch for changes. Empty your bag at bedtime, before physical activity or sexual relations, and whenever it is one-third to one-half full. Do not let the bag get more than half-full with stool or gas. Change your bag every 3-4 days or as often as told by your health care provider. Whenever you leave home, take extra clothing and an extra ostomy pouch  with you. This information is not intended to replace advice given to you by your health care provider. Make sure you discuss any questions you have with your health care provider. Document Revised: 07/18/2021 Document Reviewed: 07/18/2021 Elsevier Patient Education  2024 [employer].

## 2024-04-06 DIAGNOSIS — I10 Essential (primary) hypertension: Secondary | ICD-10-CM | POA: Diagnosis not present

## 2024-04-06 DIAGNOSIS — E782 Mixed hyperlipidemia: Secondary | ICD-10-CM | POA: Diagnosis not present

## 2024-04-06 DIAGNOSIS — Z433 Encounter for attention to colostomy: Secondary | ICD-10-CM | POA: Diagnosis not present

## 2024-04-06 DIAGNOSIS — M170 Bilateral primary osteoarthritis of knee: Secondary | ICD-10-CM | POA: Diagnosis not present

## 2024-04-06 DIAGNOSIS — J45909 Unspecified asthma, uncomplicated: Secondary | ICD-10-CM | POA: Diagnosis not present

## 2024-04-06 DIAGNOSIS — K5730 Diverticulosis of large intestine without perforation or abscess without bleeding: Secondary | ICD-10-CM | POA: Diagnosis not present

## 2024-04-06 DIAGNOSIS — K219 Gastro-esophageal reflux disease without esophagitis: Secondary | ICD-10-CM | POA: Diagnosis not present

## 2024-04-06 DIAGNOSIS — Z48815 Encounter for surgical aftercare following surgery on the digestive system: Secondary | ICD-10-CM | POA: Diagnosis not present

## 2024-04-06 DIAGNOSIS — M549 Dorsalgia, unspecified: Secondary | ICD-10-CM | POA: Diagnosis not present

## 2024-04-06 DIAGNOSIS — Z9981 Dependence on supplemental oxygen: Secondary | ICD-10-CM | POA: Diagnosis not present

## 2024-04-06 DIAGNOSIS — G4730 Sleep apnea, unspecified: Secondary | ICD-10-CM | POA: Diagnosis not present

## 2024-04-06 DIAGNOSIS — C50611 Malignant neoplasm of axillary tail of right female breast: Secondary | ICD-10-CM | POA: Diagnosis not present

## 2024-04-07 DIAGNOSIS — Z433 Encounter for attention to colostomy: Secondary | ICD-10-CM | POA: Diagnosis not present

## 2024-04-07 DIAGNOSIS — J45909 Unspecified asthma, uncomplicated: Secondary | ICD-10-CM | POA: Diagnosis not present

## 2024-04-07 DIAGNOSIS — Z9981 Dependence on supplemental oxygen: Secondary | ICD-10-CM | POA: Diagnosis not present

## 2024-04-07 DIAGNOSIS — M549 Dorsalgia, unspecified: Secondary | ICD-10-CM | POA: Diagnosis not present

## 2024-04-07 DIAGNOSIS — E782 Mixed hyperlipidemia: Secondary | ICD-10-CM | POA: Diagnosis not present

## 2024-04-07 DIAGNOSIS — K5730 Diverticulosis of large intestine without perforation or abscess without bleeding: Secondary | ICD-10-CM | POA: Diagnosis not present

## 2024-04-07 DIAGNOSIS — K5732 Diverticulitis of large intestine without perforation or abscess without bleeding: Secondary | ICD-10-CM | POA: Diagnosis not present

## 2024-04-07 DIAGNOSIS — K219 Gastro-esophageal reflux disease without esophagitis: Secondary | ICD-10-CM | POA: Diagnosis not present

## 2024-04-07 DIAGNOSIS — M170 Bilateral primary osteoarthritis of knee: Secondary | ICD-10-CM | POA: Diagnosis not present

## 2024-04-07 DIAGNOSIS — C50611 Malignant neoplasm of axillary tail of right female breast: Secondary | ICD-10-CM | POA: Diagnosis not present

## 2024-04-07 DIAGNOSIS — I10 Essential (primary) hypertension: Secondary | ICD-10-CM | POA: Diagnosis not present

## 2024-04-07 DIAGNOSIS — Z48815 Encounter for surgical aftercare following surgery on the digestive system: Secondary | ICD-10-CM | POA: Diagnosis not present

## 2024-04-07 DIAGNOSIS — G4730 Sleep apnea, unspecified: Secondary | ICD-10-CM | POA: Diagnosis not present

## 2024-04-07 DIAGNOSIS — Z933 Colostomy status: Secondary | ICD-10-CM | POA: Diagnosis not present

## 2024-04-08 DIAGNOSIS — F330 Major depressive disorder, recurrent, mild: Secondary | ICD-10-CM

## 2024-04-08 DIAGNOSIS — E669 Obesity, unspecified: Secondary | ICD-10-CM

## 2024-04-08 DIAGNOSIS — I10 Essential (primary) hypertension: Secondary | ICD-10-CM | POA: Diagnosis not present

## 2024-04-08 DIAGNOSIS — E782 Mixed hyperlipidemia: Secondary | ICD-10-CM | POA: Diagnosis not present

## 2024-04-08 DIAGNOSIS — M549 Dorsalgia, unspecified: Secondary | ICD-10-CM | POA: Diagnosis not present

## 2024-04-08 DIAGNOSIS — Z433 Encounter for attention to colostomy: Secondary | ICD-10-CM | POA: Diagnosis not present

## 2024-04-08 DIAGNOSIS — C50511 Malignant neoplasm of lower-outer quadrant of right female breast: Secondary | ICD-10-CM | POA: Diagnosis not present

## 2024-04-08 DIAGNOSIS — J45909 Unspecified asthma, uncomplicated: Secondary | ICD-10-CM | POA: Diagnosis not present

## 2024-04-08 DIAGNOSIS — M170 Bilateral primary osteoarthritis of knee: Secondary | ICD-10-CM | POA: Diagnosis not present

## 2024-04-08 DIAGNOSIS — K219 Gastro-esophageal reflux disease without esophagitis: Secondary | ICD-10-CM | POA: Diagnosis not present

## 2024-04-08 DIAGNOSIS — K5730 Diverticulosis of large intestine without perforation or abscess without bleeding: Secondary | ICD-10-CM | POA: Diagnosis not present

## 2024-04-08 DIAGNOSIS — Z48815 Encounter for surgical aftercare following surgery on the digestive system: Secondary | ICD-10-CM | POA: Diagnosis not present

## 2024-04-13 ENCOUNTER — Encounter: Payer: Self-pay | Admitting: Oncology

## 2024-04-13 ENCOUNTER — Ambulatory Visit (HOSPITAL_COMMUNITY)
Admission: RE | Admit: 2024-04-13 | Discharge: 2024-04-13 | Disposition: A | Discharge status: Home / Self Care | Source: Ambulatory Visit | Attending: *Deleted | Admitting: *Deleted

## 2024-04-13 ENCOUNTER — Telehealth: Payer: Self-pay

## 2024-04-13 DIAGNOSIS — J45909 Unspecified asthma, uncomplicated: Secondary | ICD-10-CM | POA: Diagnosis not present

## 2024-04-13 DIAGNOSIS — K5900 Constipation, unspecified: Secondary | ICD-10-CM | POA: Insufficient documentation

## 2024-04-13 DIAGNOSIS — K5780 Diverticulitis of intestine, part unspecified, with perforation and abscess without bleeding: Secondary | ICD-10-CM | POA: Diagnosis not present

## 2024-04-13 DIAGNOSIS — Z48815 Encounter for surgical aftercare following surgery on the digestive system: Secondary | ICD-10-CM | POA: Diagnosis not present

## 2024-04-13 DIAGNOSIS — K219 Gastro-esophageal reflux disease without esophagitis: Secondary | ICD-10-CM | POA: Diagnosis not present

## 2024-04-13 DIAGNOSIS — K5901 Slow transit constipation: Secondary | ICD-10-CM | POA: Diagnosis not present

## 2024-04-13 DIAGNOSIS — Z9049 Acquired absence of other specified parts of digestive tract: Secondary | ICD-10-CM | POA: Diagnosis not present

## 2024-04-13 DIAGNOSIS — K9400 Colostomy complication, unspecified: Secondary | ICD-10-CM | POA: Diagnosis not present

## 2024-04-13 DIAGNOSIS — Z9981 Dependence on supplemental oxygen: Secondary | ICD-10-CM | POA: Diagnosis not present

## 2024-04-13 DIAGNOSIS — I10 Essential (primary) hypertension: Secondary | ICD-10-CM | POA: Diagnosis not present

## 2024-04-13 DIAGNOSIS — M549 Dorsalgia, unspecified: Secondary | ICD-10-CM | POA: Diagnosis not present

## 2024-04-13 DIAGNOSIS — M170 Bilateral primary osteoarthritis of knee: Secondary | ICD-10-CM | POA: Diagnosis not present

## 2024-04-13 DIAGNOSIS — C50611 Malignant neoplasm of axillary tail of right female breast: Secondary | ICD-10-CM | POA: Diagnosis not present

## 2024-04-13 DIAGNOSIS — G4730 Sleep apnea, unspecified: Secondary | ICD-10-CM | POA: Diagnosis not present

## 2024-04-13 DIAGNOSIS — K5730 Diverticulosis of large intestine without perforation or abscess without bleeding: Secondary | ICD-10-CM | POA: Diagnosis not present

## 2024-04-13 DIAGNOSIS — E782 Mixed hyperlipidemia: Secondary | ICD-10-CM | POA: Diagnosis not present

## 2024-04-13 DIAGNOSIS — Z433 Encounter for attention to colostomy: Secondary | ICD-10-CM | POA: Insufficient documentation

## 2024-04-13 NOTE — Telephone Encounter (Signed)
 Copied from CRM [id number]. Topic: Clinical - Order For Equipment >> Apr 13, 2024  2:35 PM Wess RAMAN wrote: Reason for CRM: Beverley from [facility] stated patient's son, [name], ordered a Commode chair and rollator walker and have not heard anything  back.  Patient's son Callback #: [phone]

## 2024-04-13 NOTE — Discharge Instructions (Signed)
 Remove old pouch and clean with soap and water , pat dry.  Apply FLonase nasal spray to any red skin Apply piece of barrier ring in creases at 3 o'clock and 9 o'clock to make flat surface Mold and stretch rest of barrier ring to form a circle around stoma.  Apply pouch and barrier (Barrier opening is 1 1/4) Patient will likely need assistance with emptying and applying new pouch due to visual and dexterity issues.  Empty when 1/3 full.  Too much weight will cause pouch to pop off Item numbers:  Barrier ring  [id number] Pre cut barrier   [id number] Pouch [id number] Lubricating deodorant [id number]

## 2024-04-13 NOTE — Progress Notes (Signed)
 [facility]   Reason for visit:  LLQ end colostomy, here with private pay caregiver.  HAS HH agency as well, nurse has called in to hear recommendations on ostomy care. She states family goal is for patient to be independent with ostomy care.  Due to visual and dexterity issues, this may not be a reasonable goal.  [facility] nurse agrees and has observed the same while in the home.  Son, Jerona is told this by phone.  HPI:  Diverticulitis with perforation, colectomy and end colostomy.  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 breast; dx >50?   Prostate cancer Maternal Uncle    Hypertension Son    No Known Allergies Current Outpatient Medications  Medication Sig Dispense Refill Last Dose/Taking   albuterol  (VENTOLIN  HFA) 108 (90 Base) MCG/ACT inhaler Inhale 2 puffs into the lungs every 6 (six) hours as needed for wheezing or shortness of breath. 8.5 g 1    amLODipine  (NORVASC ) 10 MG tablet Take 10 mg by mouth daily.      aspirin EC 81 MG tablet Take 81 mg by mouth daily. Swallow whole.      atorvastatin  (LIPITOR) 20 MG tablet Take 1 tablet (20 mg total) by mouth daily. 90 tablet 3    BIOTIN PO Take 1 tablet by mouth daily.      bisacodyl  5 MG EC tablet Take one tablet by mouth every 3 days as needed, for constipation. Repeat once after 6 hours if still no bowel movement 30 tablet 0     Cholecalciferol (VITAMIN D3) 125 MCG (5000 UT) CAPS Take 1 capsule (5,000 Units total) by mouth daily. 30 capsule 11    cloNIDine  (CATAPRES ) 0.2 MG tablet Take 0.2 mg by mouth daily.      clotrimazole -betamethasone  (LOTRISONE ) cream Apply cream twice daily to rash in groin for 10 days, then as needed 45 g 1    docusate sodium  (COLACE) 100 MG capsule Take 1 capsule (100 mg total) by mouth 2 (two) times daily. 60 capsule 2    famotidine (PEPCID) 10 MG tablet Take 10 mg by mouth 2 (two) times daily.      furosemide  (LASIX ) 40 MG tablet Take 40 mg by mouth daily as needed.      gabapentin  (NEURONTIN ) 100 MG capsule TAKE ONE CAPSULE BY MOUTH AT BEDTIME 90 capsule 1    Hydrocortisone (GERHARDT'S BUTT CREAM) CREA Apply 1 application topically 3 (three) times daily. 1 each 1    losartan -hydrochlorothiazide  (HYZAAR) 100-25 MG tablet Take 1 tablet by mouth daily.      magnesium  30 MG tablet Take one tablet by mouth once daily 30  tablet 2    megestrol  (MEGACE ) 40 MG/ML suspension Take 10 mLs (400 mg total) by mouth 2 (two) times daily. 480 mL 0    Melatonin 3 MG CAPS Take 1 capsule (3 mg total) by mouth at bedtime. 30 capsule 3    metoprolol  succinate (TOPROL -XL) 50 MG 24 hr tablet TAKE 1 TABLET BY MOUTH  DAILY WITH OR IMMEDIATELY  FOLLOWING A MEAL 100 tablet 2    olopatadine  (PATANOL) 0.1 % ophthalmic solution Place 1 drop into both eyes 2 (two) times daily. 5 mL 6    omeprazole  (PRILOSEC) 20 MG capsule Take 1 capsule (20 mg total) by mouth daily. 100 capsule 2    ondansetron  (ZOFRAN ) 4 MG tablet Take 1 tablet (4 mg total) by mouth daily as needed for nausea or vomiting. 30 tablet 1    oxyCODONE  (OXY IR/ROXICODONE ) 5 MG immediate release tablet Take 1 tablet (5 mg total) by mouth every 4 (four) hours as needed for moderate pain (pain score 4-6). SNF use only 10 tablet 0    polyethylene glycol powder (GLYCOLAX /MIRALAX ) 17 GM/SCOOP powder Take 17 g by mouth 2 (two) times daily as needed. 3350 g 3     potassium chloride  SA (KLOR-CON  M) 20 MEQ tablet Take 20 mEq by mouth daily.      prochlorperazine  (COMPAZINE ) 10 MG tablet Take 1 tablet (10 mg total) by mouth every 6 (six) hours as needed for nausea or vomiting. 60 tablet 3    pyridOXINE  (VITAMIN B-6) 100 MG tablet Take 200 mg by mouth daily.      sertraline  (ZOLOFT ) 50 MG tablet Take 1 tablet (50 mg total) by mouth daily. 30 tablet 3    temazepam  (RESTORIL ) 15 MG capsule Take 1 capsule (15 mg total) by mouth at bedtime as needed for sleep. 30 capsule 5    No current facility-administered medications for this encounter.   ROS  Review of Systems  Constitutional:  Positive for fatigue.  Respiratory:         Hx asthma  Cardiovascular:        Hx hypertension  Gastrointestinal:  Positive for constipation.       LLQ colostomy  Musculoskeletal:  Positive for gait problem.  Skin:  Positive for rash.  Psychiatric/Behavioral: Negative.    All other systems reviewed and are negative.  Vital signs:  BP 105/60   Pulse 94   Temp 98.2 F (36.8 C) (Oral)   Resp 18   SpO2 98%  Exam:  Physical Exam Vitals reviewed.  Constitutional:      Appearance: Normal appearance.  HENT:     Mouth/Throat:     Mouth: Mucous membranes are moist.  Cardiovascular:     Rate and Rhythm: Normal rate.  Pulmonary:     Effort: Pulmonary effort is normal.  Abdominal:     Palpations: Abdomen is soft.  Skin:    General: Skin is warm and dry.     Findings: Erythema present.  Neurological:     Mental Status: She is alert and oriented to person, place, and time. Mental status is at baseline.  Psychiatric:        Mood and Affect: Mood normal.        Behavior: Behavior normal.     Stoma type/location:  LLQ colostomy Stomal assessment/size:  1 1/2 pink budded and moist Peristomal assessment:  erythema  creasing at 3 and 9 o'clock Treatment options for stomal/peristomal skin: Prefers 2piece pouch, she states Output: soft brown stoo l Ostomy  pouching: 2pc.  Convex system Education provided:  Due to dexterity issues, recommend presized convex barrier (ITEM # [id number])  Filtered pouch Barrier ring  Stool is thick and pasty and we discuss drinking adequate fluids and may take MIralax  as needed when stool is thick or hard balls.  We add lubricating deodorant which may help thick stool fall down into bottom of pouch to make emptying easier.  Patient not likely to be able to manage ostomy care independently.  She is able to empty.      Impression/dx  Colostomy constipation Discussion  Adding presized barrier to aid in ostomy care and patient gaining more independence Plan  See back as needed.  Will enroll with [facility], but has HH that may be providing pouches at this time.     Visit time: 55 minutes.   Darice Cooley FNP-BC

## 2024-04-14 DIAGNOSIS — K5730 Diverticulosis of large intestine without perforation or abscess without bleeding: Secondary | ICD-10-CM | POA: Diagnosis not present

## 2024-04-14 DIAGNOSIS — M170 Bilateral primary osteoarthritis of knee: Secondary | ICD-10-CM | POA: Diagnosis not present

## 2024-04-14 DIAGNOSIS — Z48815 Encounter for surgical aftercare following surgery on the digestive system: Secondary | ICD-10-CM | POA: Diagnosis not present

## 2024-04-14 DIAGNOSIS — Z433 Encounter for attention to colostomy: Secondary | ICD-10-CM | POA: Diagnosis not present

## 2024-04-14 DIAGNOSIS — G4730 Sleep apnea, unspecified: Secondary | ICD-10-CM | POA: Diagnosis not present

## 2024-04-14 DIAGNOSIS — C50611 Malignant neoplasm of axillary tail of right female breast: Secondary | ICD-10-CM | POA: Diagnosis not present

## 2024-04-14 DIAGNOSIS — J45909 Unspecified asthma, uncomplicated: Secondary | ICD-10-CM | POA: Diagnosis not present

## 2024-04-14 DIAGNOSIS — E782 Mixed hyperlipidemia: Secondary | ICD-10-CM | POA: Diagnosis not present

## 2024-04-14 DIAGNOSIS — I10 Essential (primary) hypertension: Secondary | ICD-10-CM | POA: Diagnosis not present

## 2024-04-14 DIAGNOSIS — M549 Dorsalgia, unspecified: Secondary | ICD-10-CM | POA: Diagnosis not present

## 2024-04-14 DIAGNOSIS — Z9981 Dependence on supplemental oxygen: Secondary | ICD-10-CM | POA: Diagnosis not present

## 2024-04-14 DIAGNOSIS — K219 Gastro-esophageal reflux disease without esophagitis: Secondary | ICD-10-CM | POA: Diagnosis not present

## 2024-04-19 DIAGNOSIS — L24B3 Irritant contact dermatitis related to fecal or urinary stoma or fistula: Secondary | ICD-10-CM | POA: Insufficient documentation

## 2024-04-19 DIAGNOSIS — K5901 Slow transit constipation: Secondary | ICD-10-CM | POA: Insufficient documentation

## 2024-04-19 DIAGNOSIS — K9400 Colostomy complication, unspecified: Secondary | ICD-10-CM | POA: Insufficient documentation

## 2024-04-20 DIAGNOSIS — M170 Bilateral primary osteoarthritis of knee: Secondary | ICD-10-CM | POA: Diagnosis not present

## 2024-04-20 DIAGNOSIS — Z433 Encounter for attention to colostomy: Secondary | ICD-10-CM | POA: Diagnosis not present

## 2024-04-20 DIAGNOSIS — M549 Dorsalgia, unspecified: Secondary | ICD-10-CM | POA: Diagnosis not present

## 2024-04-20 DIAGNOSIS — E782 Mixed hyperlipidemia: Secondary | ICD-10-CM | POA: Diagnosis not present

## 2024-04-20 DIAGNOSIS — G4730 Sleep apnea, unspecified: Secondary | ICD-10-CM | POA: Diagnosis not present

## 2024-04-20 DIAGNOSIS — I10 Essential (primary) hypertension: Secondary | ICD-10-CM | POA: Diagnosis not present

## 2024-04-20 DIAGNOSIS — Z48815 Encounter for surgical aftercare following surgery on the digestive system: Secondary | ICD-10-CM | POA: Diagnosis not present

## 2024-04-20 DIAGNOSIS — Z9981 Dependence on supplemental oxygen: Secondary | ICD-10-CM | POA: Diagnosis not present

## 2024-04-20 DIAGNOSIS — K219 Gastro-esophageal reflux disease without esophagitis: Secondary | ICD-10-CM | POA: Diagnosis not present

## 2024-04-20 DIAGNOSIS — C50611 Malignant neoplasm of axillary tail of right female breast: Secondary | ICD-10-CM | POA: Diagnosis not present

## 2024-04-20 DIAGNOSIS — J45909 Unspecified asthma, uncomplicated: Secondary | ICD-10-CM | POA: Diagnosis not present

## 2024-04-20 DIAGNOSIS — K5730 Diverticulosis of large intestine without perforation or abscess without bleeding: Secondary | ICD-10-CM | POA: Diagnosis not present

## 2024-04-22 DIAGNOSIS — Z433 Encounter for attention to colostomy: Secondary | ICD-10-CM | POA: Diagnosis not present

## 2024-04-22 DIAGNOSIS — I10 Essential (primary) hypertension: Secondary | ICD-10-CM | POA: Diagnosis not present

## 2024-04-22 DIAGNOSIS — M549 Dorsalgia, unspecified: Secondary | ICD-10-CM | POA: Diagnosis not present

## 2024-04-22 DIAGNOSIS — K219 Gastro-esophageal reflux disease without esophagitis: Secondary | ICD-10-CM | POA: Diagnosis not present

## 2024-04-22 DIAGNOSIS — E782 Mixed hyperlipidemia: Secondary | ICD-10-CM | POA: Diagnosis not present

## 2024-04-22 DIAGNOSIS — C50611 Malignant neoplasm of axillary tail of right female breast: Secondary | ICD-10-CM | POA: Diagnosis not present

## 2024-04-22 DIAGNOSIS — M170 Bilateral primary osteoarthritis of knee: Secondary | ICD-10-CM | POA: Diagnosis not present

## 2024-04-22 DIAGNOSIS — K5730 Diverticulosis of large intestine without perforation or abscess without bleeding: Secondary | ICD-10-CM | POA: Diagnosis not present

## 2024-04-22 DIAGNOSIS — G4730 Sleep apnea, unspecified: Secondary | ICD-10-CM | POA: Diagnosis not present

## 2024-04-22 DIAGNOSIS — Z48815 Encounter for surgical aftercare following surgery on the digestive system: Secondary | ICD-10-CM | POA: Diagnosis not present

## 2024-04-22 DIAGNOSIS — J45909 Unspecified asthma, uncomplicated: Secondary | ICD-10-CM | POA: Diagnosis not present

## 2024-04-22 DIAGNOSIS — Z9981 Dependence on supplemental oxygen: Secondary | ICD-10-CM | POA: Diagnosis not present

## 2024-04-26 ENCOUNTER — Ambulatory Visit
Admission: EM | Admit: 2024-04-26 | Discharge: 2024-04-26 | Disposition: A | Discharge status: Home / Self Care | Attending: Family Medicine | Admitting: Family Medicine

## 2024-04-26 ENCOUNTER — Ambulatory Visit: Payer: Self-pay

## 2024-04-26 ENCOUNTER — Ambulatory Visit: Payer: Self-pay | Admitting: Nurse Practitioner

## 2024-04-26 ENCOUNTER — Encounter: Payer: Self-pay | Admitting: Oncology

## 2024-04-26 ENCOUNTER — Ambulatory Visit (INDEPENDENT_AMBULATORY_CARE_PROVIDER_SITE_OTHER)

## 2024-04-26 DIAGNOSIS — M549 Dorsalgia, unspecified: Secondary | ICD-10-CM | POA: Diagnosis not present

## 2024-04-26 DIAGNOSIS — M79672 Pain in left foot: Secondary | ICD-10-CM | POA: Diagnosis not present

## 2024-04-26 DIAGNOSIS — C50611 Malignant neoplasm of axillary tail of right female breast: Secondary | ICD-10-CM | POA: Diagnosis not present

## 2024-04-26 DIAGNOSIS — M7989 Other specified soft tissue disorders: Secondary | ICD-10-CM | POA: Diagnosis not present

## 2024-04-26 DIAGNOSIS — Z48815 Encounter for surgical aftercare following surgery on the digestive system: Secondary | ICD-10-CM | POA: Diagnosis not present

## 2024-04-26 DIAGNOSIS — K219 Gastro-esophageal reflux disease without esophagitis: Secondary | ICD-10-CM | POA: Diagnosis not present

## 2024-04-26 DIAGNOSIS — Z433 Encounter for attention to colostomy: Secondary | ICD-10-CM | POA: Diagnosis not present

## 2024-04-26 DIAGNOSIS — G4730 Sleep apnea, unspecified: Secondary | ICD-10-CM | POA: Diagnosis not present

## 2024-04-26 DIAGNOSIS — E782 Mixed hyperlipidemia: Secondary | ICD-10-CM | POA: Diagnosis not present

## 2024-04-26 DIAGNOSIS — Z9981 Dependence on supplemental oxygen: Secondary | ICD-10-CM | POA: Diagnosis not present

## 2024-04-26 DIAGNOSIS — M170 Bilateral primary osteoarthritis of knee: Secondary | ICD-10-CM | POA: Diagnosis not present

## 2024-04-26 DIAGNOSIS — M85872 Other specified disorders of bone density and structure, left ankle and foot: Secondary | ICD-10-CM | POA: Diagnosis not present

## 2024-04-26 DIAGNOSIS — I10 Essential (primary) hypertension: Secondary | ICD-10-CM | POA: Diagnosis not present

## 2024-04-26 DIAGNOSIS — J45909 Unspecified asthma, uncomplicated: Secondary | ICD-10-CM | POA: Diagnosis not present

## 2024-04-26 DIAGNOSIS — K5730 Diverticulosis of large intestine without perforation or abscess without bleeding: Secondary | ICD-10-CM | POA: Diagnosis not present

## 2024-04-26 HISTORY — DX: Malignant (primary) neoplasm, unspecified: C80.1

## 2024-04-26 NOTE — ED Provider Notes (Signed)
 [facility]    CSN: [id number] Arrival date & time: 04/26/24  1715      History   Chief Complaint Chief Complaint  Patient presents with   Foot Pain    HPI [name] is a 78 y.o. female with a complex past medical history listed below who presents for left foot pain.  Patient is companied by daughter-in-law.  Patient reports 2 to 3 days of left foot pain on the dorsal aspect as well as the lateral aspect and heel.  Denies any known injury or inciting event.  No bruising, numbness tingling.  No redness or warmth.  She was previously on chemotherapy for breast cancer is awaiting radiation therapy.  She recently had a colostomy placed and they are waiting for her to heal prior to starting radiation.  She reports she does have circulation issues.  Denies history of diabetes.  She has not taken any OTC medications for symptoms.  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   Gait disturbance 03/31/2024   Debility 03/31/2024   Peripheral neuropathy 03/17/2024   Severe sepsis (HCC) 02/23/2024   Requires daily assistance for activities of daily living (ADL) and comfort needs 02/17/2024   Encounter for support and coordination of transition of care 02/17/2024   Diverticulitis 02/03/2024   Acute diverticulitis 01/25/2024   Invasive ductal carcinoma of breast, female, right (HCC) 10/22/2023   BRCA2 gene mutation positive  10/13/2023   Genetic testing 10/10/2023   Breast cancer of lower-inner quadrant of right female breast (HCC) 09/16/2023   Depression, major, single episode, severe (HCC) 08/25/2023   Complicated grief 05/12/2023   Insomnia due to anxiety and fear 02/01/2023   Decreased hearing 12/15/2022   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   Thrombocytopenia 12/03/2022   Hypokalemia 12/01/2022   Hypomagnesemia 12/01/2022   Transient alteration of awareness 12/01/2022   Bilateral lower extremity edema 10/18/2022   Fatigue due to excessive exertion 06/25/2022   Reduced vision 06/18/2021   Osteopenia 06/17/2021   Upper airway cough syndrome 06/13/2021   Chronic respiratory failure with hypoxia (HCC) 01/29/2021   DOE (dyspnea on exertion) 01/29/2021   Bilateral shoulder pain 08/14/2020   Trigger finger, left little finger 06/09/2019   Alteration in skin integrity due to moisture 03/30/2019   Chronic right shoulder pain 07/05/2018   Intermittent knee pain 05/28/2018   Vitamin D  deficiency 11/22/2015   Back pain 09/15/2013   Dermatomycosis 12/22/2012   Carpal tunnel syndrome on left 07/31/2011   Prediabetes 10/16/2007   Hyperlipidemia LDL goal <100 10/16/2007   Morbid obesity due to excess calories (HCC) complicated by hbp/hyperlipidemia 10/16/2007   Anxiety and depression 10/16/2007   Essential hypertension 10/16/2007   Pulmonary hypertension (HCC) 10/16/2007   GERD 10/16/2007   Diverticulosis of colon 10/16/2007   OSTEOARTHRITIS, KNEES, 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    Past  Surgical History:  Procedure Laterality Date   ABDOMINAL HYSTERECTOMY     AXILLARY SENTINEL NODE BIOPSY Right 10/22/2023   Procedure: BIOPSY, LYMPH NODE, SENTINEL, AXILLARY;  Surgeon: Evonnie Dorothyann LABOR, DO;  Location: [facility];  Service: General;  Laterality: Right;   BREAST BIOPSY Right 09/09/2023   US  RT BREAST BX W LOC DEV 1ST LESION IMG BX SPEC US  GUIDE 09/09/2023 Lennon Nest, MD [facility]   BREAST  LUMPECTOMY WITH RADIO FREQUENCY LOCALIZER Right 10/22/2023   Procedure: BREAST LUMPECTOMY WITH RADIO FREQUENCY LOCALIZER;  Surgeon: Evonnie Dorothyann LABOR, DO;  Location: [facility];  Service: General;  Laterality: Right;   COLECTOMY WITH COLOSTOMY CREATION/HARTMANN PROCEDURE N/A 02/24/2024   Procedure: COLECTOMY, WITH COLOSTOMY CREATION;  Surgeon: Jordis Laneta FALCON, MD;  Location: [facility];  Service: General;  Laterality: N/A;   COLONOSCOPY  07/23/2003   Dr. Keven Smith:numerous large scattered diverticula   COLONOSCOPY N/A 11/15/2013   Dr. Harvey: moderate diverticula, small internal hemorrhoids, redudant colon. Next TCS in 2025 with overtube.    ESOPHAGOGASTRODUODENOSCOPY (EGD) WITH ESOPHAGEAL DILATION N/A 11/15/2013   Dr. Harvey: stricture at GE junction s/p dilation. moderate erosive gastritis, negative H.pylori   MULTIPLE TOOTH EXTRACTIONS  2024   PORTACATH PLACEMENT Left 10/22/2023   Procedure: INSERTION, TUNNELED CENTRAL VENOUS DEVICE, WITH PORT;  Surgeon: Evonnie Dorothyann LABOR, DO;  Location: [facility];  Service: General;  Laterality: Left;   TOTAL KNEE ARTHROPLASTY  06/01/2004   left / Dr. Margrette   TOTAL KNEE ARTHROPLASTY  11/29/2003   right / Dr. Margrette   TUBAL LIGATION      OB History     Gravida  2   Para  2   Term  2   Preterm      AB      Living         SAB      IAB      Ectopic      Multiple      Live Births               Home Medications    Prior to Admission medications   Medication Sig Start Date End Date Taking? Authorizing Provider  albuterol  (VENTOLIN  HFA) 108 (90 Base) MCG/ACT inhaler Inhale 2 puffs into the lungs every 6 (six) hours as needed for wheezing or shortness of breath. 02/26/24  Yes Antonetta Rollene BRAVO, MD  aspirin EC 81 MG tablet Take 81 mg by mouth daily. Swallow whole.   Yes [provider]  atorvastatin  (LIPITOR) 20 MG tablet Take 1 tablet (20 mg total) by mouth daily. 05/29/23  Yes Antonetta Rollene BRAVO, MD   clotrimazole -betamethasone  (LOTRISONE ) cream Apply cream twice daily to rash in groin for 10 days, then as needed 05/29/23  Yes Antonetta Rollene BRAVO, MD  docusate sodium  (COLACE) 100 MG capsule Take 1 capsule (100 mg total) by mouth 2 (two) times daily. 10/22/23 10/21/24 Yes Pappayliou, Dorothyann A, DO  famotidine (PEPCID) 10 MG tablet Take 10 mg by mouth 2 (two) times daily.   Yes [provider]  furosemide  (LASIX ) 40 MG tablet Take 40 mg by mouth daily as needed. 02/11/24  Yes [provider]  gabapentin  (NEURONTIN ) 100 MG capsule TAKE ONE CAPSULE BY MOUTH AT BEDTIME 03/18/24  Yes Antonetta Rollene BRAVO, MD  losartan -hydrochlorothiazide  (HYZAAR) 100-25 MG tablet Take 1 tablet by mouth daily. 02/11/24  Yes [provider]  metoprolol  succinate (TOPROL -XL) 50 MG 24 hr tablet TAKE 1 TABLET BY MOUTH  DAILY WITH OR  IMMEDIATELY  FOLLOWING A MEAL 05/29/23  Yes Antonetta Rollene BRAVO, MD  potassium chloride  SA (KLOR-CON  M) 20 MEQ tablet Take 20 mEq by mouth daily. 02/11/24  Yes [provider]  sertraline  (ZOLOFT ) 50 MG tablet Take 1 tablet (50 mg total) by mouth daily. 08/20/23  Yes Antonetta Rollene BRAVO, MD  temazepam  (RESTORIL ) 15 MG capsule Take 1 capsule (15 mg total) by mouth at bedtime as needed for sleep. 10/14/23  Yes Antonetta Rollene BRAVO, MD  amLODipine  (NORVASC ) 10 MG tablet Take 10 mg by mouth daily.    [provider]  BIOTIN PO Take 1 tablet by mouth daily.    [provider]  bisacodyl  5 MG EC tablet Take one tablet by mouth every 3 days as needed, for constipation. Repeat once after 6 hours if still no bowel movement 01/15/24   Antonetta Rollene BRAVO, MD  Cholecalciferol (VITAMIN D3) 125 MCG (5000 UT) CAPS Take 1 capsule (5,000 Units total) by mouth daily. 05/29/23   Antonetta Rollene BRAVO, MD  cloNIDine  (CATAPRES ) 0.2 MG tablet Take 0.2 mg by mouth daily.    [provider]  Hydrocortisone (GERHARDT'S BUTT CREAM) CREA Apply 1 application topically 3  (three) times daily. 03/30/19   Moishe Chiquita HERO, NP  magnesium  30 MG tablet Take one tablet by mouth once daily 05/29/23   Antonetta Rollene BRAVO, MD  megestrol  (MEGACE ) 40 MG/ML suspension Take 10 mLs (400 mg total) by mouth 2 (two) times daily. 02/11/24   Katragadda, Sreedhar, MD  Melatonin 3 MG CAPS Take 1 capsule (3 mg total) by mouth at bedtime. 05/29/23   Antonetta Rollene BRAVO, MD  olopatadine  (PATANOL) 0.1 % ophthalmic solution Place 1 drop into both eyes 2 (two) times daily. 05/29/23   Antonetta Rollene BRAVO, MD  omeprazole  (PRILOSEC) 20 MG capsule Take 1 capsule (20 mg total) by mouth daily. 05/29/23   Antonetta Rollene BRAVO, MD  ondansetron  (ZOFRAN ) 4 MG tablet Take 1 tablet (4 mg total) by mouth daily as needed for nausea or vomiting. 10/22/23 10/21/24  Pappayliou, Dorothyann A, DO  oxyCODONE  (OXY IR/ROXICODONE ) 5 MG immediate release tablet Take 1 tablet (5 mg total) by mouth every 4 (four) hours as needed for moderate pain (pain score 4-6). SNF use only 03/02/24   Sreenath, Calvin B, MD  polyethylene glycol powder (GLYCOLAX /MIRALAX ) 17 GM/SCOOP powder Take 17 g by mouth 2 (two) times daily as needed. 01/15/24   Antonetta Rollene BRAVO, MD  prochlorperazine  (COMPAZINE ) 10 MG tablet Take 1 tablet (10 mg total) by mouth every 6 (six) hours as needed for nausea or vomiting. 11/24/23   Rogers Hai, MD  pyridOXINE  (VITAMIN B-6) 100 MG tablet Take 200 mg by mouth daily.    [provider]    Family History Family History  Problem Relation Age of Onset   Diabetes Mother    Hypertension Mother    Heart disease Mother    Aneurysm Father    Diabetes Father    Hypertension Brother    Diabetes Brother    Diabetes Brother    Diabetes Brother    Hypertension Brother    Breast cancer Maternal Aunt        dx >50   Cancer Maternal Aunt        unknown type; ? breast; dx >50?   Prostate cancer Maternal Uncle    Hypertension Son     Social History Social History   Tobacco Use   Smoking status:  Never    Passive exposure: Never  Smokeless tobacco: Never  Vaping Use   Vaping status: Never Used  Substance Use Topics   Alcohol use: No   Drug use: No     Allergies   Patient has no known allergies.   Review of Systems Review of Systems  Musculoskeletal:        Left foot pain     Physical Exam Triage Vital Signs ED Triage Vitals  Encounter Vitals Group     BP 04/26/24 1751 (!) 151/72     Girls Systolic BP Percentile --      Girls Diastolic BP Percentile --      Boys Systolic BP Percentile --      Boys Diastolic BP Percentile --      Pulse Rate 04/26/24 1751 85     Resp 04/26/24 1751 17     Temp 04/26/24 1751 97.7 F (36.5 C)     Temp Source 04/26/24 1751 Oral     SpO2 04/26/24 1751 97 %     Weight 04/26/24 1749 180 lb (81.6 kg)     Height --      Head Circumference --      Peak Flow --      Pain Score 04/26/24 1748 8     Pain Loc --      Pain Education --      Exclude from Growth Chart --    No data found.  Updated Vital Signs BP (!) 151/72 (BP Location: Right Arm)   Pulse 85   Temp 97.7 F (36.5 C) (Oral)   Resp 17   Wt 180 lb (81.6 kg)   SpO2 97%   BMI 35.15 kg/m   Visual Acuity Right Eye Distance:   Left Eye Distance:   Bilateral Distance:    Right Eye Near:   Left Eye Near:    Bilateral Near:     Physical Exam Vitals and nursing note reviewed.  Constitutional:      General: She is not in acute distress.    Appearance: Normal appearance. She is not ill-appearing.  HENT:     Head: Normocephalic and atraumatic.  Eyes:     Pupils: Pupils are equal, round, and reactive to light.  Cardiovascular:     Rate and Rhythm: Normal rate.  Pulmonary:     Effort: Pulmonary effort is normal.  Musculoskeletal:     Left foot: Normal range of motion and normal capillary refill. Swelling, tenderness and bony tenderness present. No deformity, bunion, Charcot foot, foot drop, prominent metatarsal heads, laceration or crepitus. Normal pulse.      Comments: Tender to palpation to the lateral malleolus that extends slightly to the proximal dorsal aspect of the foot.  There is very minimal nonpitting swelling without erythema warmth.  Skin is intact.  DP is +2.  Skin:    General: Skin is warm and dry.  Neurological:     General: No focal deficit present.     Mental Status: She is alert and oriented to person, place, and time.  Psychiatric:        Mood and Affect: Mood normal.        Behavior: Behavior normal.      UC Treatments / Results  Labs (all labs ordered are listed, but only abnormal results are displayed) Labs Reviewed - No data to display  EKG   Radiology No results found.  Procedures Procedures (including critical care time)  Medications Ordered in UC Medications - No data to display  Initial Impression /  Assessment and Plan / UC Course  I have reviewed the triage vital signs and the nursing notes.  Pertinent labs & imaging results that were available during my care of the patient were reviewed by me and considered in my medical decision making (see chart for details).     I reviewed exam and symptoms with patient.  Wet read of x-ray without obvious fractures, will contact for any positive results based on radiology overread.  Did discuss some arthritis which could be contributing to her symptoms.  There is no sign of cellulitis on exam.  Will do RICE therapy and Ace wrap applied in clinic.  She will continue Tylenol  as needed.  Advised to follow-up with PCP if symptoms do not improve.  ER precautions reviewed Final Clinical Impressions(s) / UC Diagnoses   Final diagnoses:  Left foot pain     Discharge Instructions      Use the Ace wrap to help with your swelling.  You may elevate and ice as needed.  Continue Tylenol .  Please follow-up with your PCP if your symptoms are not improving.  Please go to the ER if you develop any worsening symptoms.  Hope you feel better soon!     ED Prescriptions    None    PDMP not reviewed this encounter.   Loreda Myla SAUNDERS, NP 04/26/24 [phone]

## 2024-04-26 NOTE — Telephone Encounter (Signed)
 Patient may get a family member to take her to Urgent Care later today but they would like to know PCP's advice as well. Requesting a call back if possible.  FYI Only or Action Required?: Action required by provider: clinical question for provider and update on patient condition.  Patient was last seen in primary care on 03/31/2024 by Bevely Doffing, FNP.  Called Nurse Triage reporting Foot Pain.  Symptoms began several days ago.  Interventions attempted: OTC medications: tylenol  and ibuprofen .  Symptoms are: gradually worsening.  Triage Disposition: See HCP Within 4 Hours (Or PCP Triage)  Patient/caregiver understands and will follow disposition?: Unsure--patient may get her family member to take her to Urgent Care later today         Copied from CRM [id number]. Topic: Clinical - Red Word Triage >> Apr 26, 2024  2:50 PM Berwyn MATSU wrote: Red Word that prompted transfer to Nurse Triage: patients colostomy bag area is bleeding,. Patient is also having swollen on top of foot and painful warm to touch per previous home health nurse current states painful only for patient. Reason for Disposition  [1] SEVERE pain (e.g., excruciating, unable to do any normal activities) AND [2] not improved after 2 hours of pain medicine  Answer Assessment - Initial Assessment Questions Patient states she usually sleeps with socks on but a few nights ago she took her socks off and ever since then her left foot has been bothering her Home Health Nurse Beverley with patient  Top of left foot hurts--no injuries--swollen a little bit---pain with moving toes---no color difference 3 or 4 days Occupational therapy earlier thought it felt warm but home health nurse there now says it does not feel warm to her at this time Patient thinks her left foot might be a little swollen to her but there is no obvious redness or discoloration  Patient also said there was some blood around stoma where her colostomy bag is--patient  states this isnt bad and the area is not warm to the touch--patient states she is not worried about this--she is more concerned about her foot at this time  Also states off and on back pain for the past month or more--denies any back pain during triage Patient is advised with that severe foot pain with unknown cause of the pain---it is recommended she gets it checked out today There are no available appointments today and patient states that she does not have any transportation at the moment anyway The patient and Beverley, home health nurse, both states that the patient's son or grandson could possibly take her to Urgent Care later today She is advised to call us  back with any changes  1. ONSET: When did the pain start?      3-4 days ago 2. LOCATION: Where is the pain located?      On top of left foot 3. PAIN: How bad is the pain?    (Scale 1-10; or mild, moderate, severe)     9 4. WORK OR EXERCISE: Has there been any recent work or exercise that involved this part of the body?      Daily use 5. CAUSE: What do you think is causing the foot pain?     unknown  Protocols used: Foot Pain-A-AH

## 2024-04-26 NOTE — ED Triage Notes (Signed)
 Patient here with daughter in law  Left foot pain 2-3 days  Patient states that her foot hurts when she extends and bends the foot. Patient has home health care nurse who said that foot is swollen. Patient is taking chemo treatment.

## 2024-04-26 NOTE — Discharge Instructions (Addendum)
 Use the Ace wrap to help with your swelling.  You may elevate and ice as needed.  Continue Tylenol .  Please follow-up with your PCP if your symptoms are not improving.  Please go to the ER if you develop any worsening symptoms.  Hope you feel better soon!

## 2024-04-27 NOTE — Telephone Encounter (Signed)
Went to [place]

## 2024-04-29 DIAGNOSIS — C50611 Malignant neoplasm of axillary tail of right female breast: Secondary | ICD-10-CM | POA: Diagnosis not present

## 2024-04-29 DIAGNOSIS — Z48815 Encounter for surgical aftercare following surgery on the digestive system: Secondary | ICD-10-CM | POA: Diagnosis not present

## 2024-04-29 DIAGNOSIS — M170 Bilateral primary osteoarthritis of knee: Secondary | ICD-10-CM | POA: Diagnosis not present

## 2024-04-29 DIAGNOSIS — J45909 Unspecified asthma, uncomplicated: Secondary | ICD-10-CM | POA: Diagnosis not present

## 2024-04-29 DIAGNOSIS — Z433 Encounter for attention to colostomy: Secondary | ICD-10-CM | POA: Diagnosis not present

## 2024-04-29 DIAGNOSIS — K219 Gastro-esophageal reflux disease without esophagitis: Secondary | ICD-10-CM | POA: Diagnosis not present

## 2024-04-29 DIAGNOSIS — G4730 Sleep apnea, unspecified: Secondary | ICD-10-CM | POA: Diagnosis not present

## 2024-04-29 DIAGNOSIS — M549 Dorsalgia, unspecified: Secondary | ICD-10-CM | POA: Diagnosis not present

## 2024-04-29 DIAGNOSIS — I10 Essential (primary) hypertension: Secondary | ICD-10-CM | POA: Diagnosis not present

## 2024-04-29 DIAGNOSIS — E782 Mixed hyperlipidemia: Secondary | ICD-10-CM | POA: Diagnosis not present

## 2024-04-29 DIAGNOSIS — K5730 Diverticulosis of large intestine without perforation or abscess without bleeding: Secondary | ICD-10-CM | POA: Diagnosis not present

## 2024-04-29 DIAGNOSIS — Z9981 Dependence on supplemental oxygen: Secondary | ICD-10-CM | POA: Diagnosis not present

## 2024-05-04 DIAGNOSIS — Z48815 Encounter for surgical aftercare following surgery on the digestive system: Secondary | ICD-10-CM | POA: Diagnosis not present

## 2024-05-04 DIAGNOSIS — G4730 Sleep apnea, unspecified: Secondary | ICD-10-CM | POA: Diagnosis not present

## 2024-05-04 DIAGNOSIS — M549 Dorsalgia, unspecified: Secondary | ICD-10-CM | POA: Diagnosis not present

## 2024-05-04 DIAGNOSIS — K219 Gastro-esophageal reflux disease without esophagitis: Secondary | ICD-10-CM | POA: Diagnosis not present

## 2024-05-04 DIAGNOSIS — K5730 Diverticulosis of large intestine without perforation or abscess without bleeding: Secondary | ICD-10-CM | POA: Diagnosis not present

## 2024-05-04 DIAGNOSIS — M170 Bilateral primary osteoarthritis of knee: Secondary | ICD-10-CM | POA: Diagnosis not present

## 2024-05-04 DIAGNOSIS — E782 Mixed hyperlipidemia: Secondary | ICD-10-CM | POA: Diagnosis not present

## 2024-05-04 DIAGNOSIS — Z433 Encounter for attention to colostomy: Secondary | ICD-10-CM | POA: Diagnosis not present

## 2024-05-04 DIAGNOSIS — Z9981 Dependence on supplemental oxygen: Secondary | ICD-10-CM | POA: Diagnosis not present

## 2024-05-04 DIAGNOSIS — I10 Essential (primary) hypertension: Secondary | ICD-10-CM | POA: Diagnosis not present

## 2024-05-04 DIAGNOSIS — C50611 Malignant neoplasm of axillary tail of right female breast: Secondary | ICD-10-CM | POA: Diagnosis not present

## 2024-05-04 DIAGNOSIS — J45909 Unspecified asthma, uncomplicated: Secondary | ICD-10-CM | POA: Diagnosis not present

## 2024-05-04 DIAGNOSIS — Z933 Colostomy status: Secondary | ICD-10-CM | POA: Diagnosis not present

## 2024-05-05 DIAGNOSIS — K219 Gastro-esophageal reflux disease without esophagitis: Secondary | ICD-10-CM | POA: Diagnosis not present

## 2024-05-05 DIAGNOSIS — I10 Essential (primary) hypertension: Secondary | ICD-10-CM | POA: Diagnosis not present

## 2024-05-05 DIAGNOSIS — Z48815 Encounter for surgical aftercare following surgery on the digestive system: Secondary | ICD-10-CM | POA: Diagnosis not present

## 2024-05-05 DIAGNOSIS — Z433 Encounter for attention to colostomy: Secondary | ICD-10-CM | POA: Diagnosis not present

## 2024-05-05 DIAGNOSIS — Z9981 Dependence on supplemental oxygen: Secondary | ICD-10-CM | POA: Diagnosis not present

## 2024-05-05 DIAGNOSIS — G4730 Sleep apnea, unspecified: Secondary | ICD-10-CM | POA: Diagnosis not present

## 2024-05-05 DIAGNOSIS — M549 Dorsalgia, unspecified: Secondary | ICD-10-CM | POA: Diagnosis not present

## 2024-05-05 DIAGNOSIS — E782 Mixed hyperlipidemia: Secondary | ICD-10-CM | POA: Diagnosis not present

## 2024-05-05 DIAGNOSIS — C50611 Malignant neoplasm of axillary tail of right female breast: Secondary | ICD-10-CM | POA: Diagnosis not present

## 2024-05-05 DIAGNOSIS — K5730 Diverticulosis of large intestine without perforation or abscess without bleeding: Secondary | ICD-10-CM | POA: Diagnosis not present

## 2024-05-05 DIAGNOSIS — J45909 Unspecified asthma, uncomplicated: Secondary | ICD-10-CM | POA: Diagnosis not present

## 2024-05-05 DIAGNOSIS — M170 Bilateral primary osteoarthritis of knee: Secondary | ICD-10-CM | POA: Diagnosis not present

## 2024-05-07 ENCOUNTER — Other Ambulatory Visit: Payer: Self-pay | Admitting: Family Medicine

## 2024-05-07 DIAGNOSIS — F5105 Insomnia due to other mental disorder: Secondary | ICD-10-CM

## 2024-05-09 ENCOUNTER — Other Ambulatory Visit: Payer: Self-pay | Admitting: Family Medicine

## 2024-05-10 ENCOUNTER — Encounter: Payer: Self-pay | Admitting: Family Medicine

## 2024-05-11 NOTE — Telephone Encounter (Signed)
 Scheduled for 12/3 sent teams message to brandi to work in sooner.

## 2024-05-12 ENCOUNTER — Other Ambulatory Visit (HOSPITAL_COMMUNITY): Payer: Self-pay | Admitting: Nurse Practitioner

## 2024-05-12 DIAGNOSIS — L24B3 Irritant contact dermatitis related to fecal or urinary stoma or fistula: Secondary | ICD-10-CM

## 2024-05-12 DIAGNOSIS — K9400 Colostomy complication, unspecified: Secondary | ICD-10-CM

## 2024-05-13 ENCOUNTER — Ambulatory Visit: Payer: Self-pay

## 2024-05-13 NOTE — Telephone Encounter (Signed)
 Home health called to reports continued pain to right foot and patient has been having pain to the inside of thighs. No pain currently. Pain is happening while trying to sleep. Son is asking for a call for recommendations from PCP. [phone]  FYI Only or Action Required?: Action required by provider: clinical question for provider.  Patient was last seen in primary care on 03/31/2024 by Bevely Doffing, FNP.  Called Nurse Triage reporting Foot Pain.  Symptoms began unknown amount of time.  Interventions attempted: Rest, hydration, or home remedies.  Symptoms are: unchanged.  Triage Disposition: See PCP When Office is Open (Within 3 Days), See PCP Within 2 Weeks  Patient/caregiver understands and will follow disposition?: No, wishes to speak with PCP  Copied from CRM #[id number]. Topic: Clinical - Red Word Triage >> May 13, 2024 10:44 AM Tiffini S wrote: [facility] that prompted transfer to Nurse Triage:  Beverley with home health called stating that the patient right foot is very painful, also pain in inner tight when laying down in the bed or sitting up Reason for Disposition  Foot pain is a chronic symptom (recurrent or ongoing AND present > 4 weeks)  [1] MODERATE pain (e.g., interferes with normal activities, limping) AND [2] present > 3 days  Answer Assessment - Initial Assessment Questions 1. ONSET: When did the pain start?      Pain has been going on for awhile 2. LOCATION: Where is the pain located?      Right foot pain 3. PAIN: How bad is the pain?    (Scale 1-10; or mild, moderate, severe)     No pain currently-pain can go up to a 10 4. WORK OR EXERCISE: Has there been any recent work or exercise that involved this part of the body?      no 5. CAUSE: What do you think is causing the foot pain?     unsure 6. OTHER SYMPTOMS: Do you have any other symptoms? (e.g., leg pain, rash, fever, numbness)     Bilateral inner thigh pain.  Answer Assessment - Initial Assessment  Questions 1. ONSET: When did the pain start?      Started yesterday 2. LOCATION: Where is the pain located?      Bilateral inner thigh pain 3. PAIN: How bad is the pain?    (Scale 1-10; or mild, moderate, severe)     No pain currently-8-9 out of 10 when pain comes on 4. WORK OR EXERCISE: Has there been any recent work or exercise that involved this part of the body?      no 5. CAUSE: What do you think is causing the leg pain?     unsure 6. OTHER SYMPTOMS: Do you have any other symptoms? (e.g., chest pain, back pain, breathing difficulty, swelling, rash, fever, numbness, weakness)     no  Protocols used: Foot Pain-A-AH, Leg Pain-A-AH

## 2024-05-14 NOTE — Progress Notes (Signed)
   05/14/2024  Patient ID: [name], female   DOB: 11/18/1945, 78 y.o.   MRN: [id number]  Pharmacy Quality Measure Review  This patient is appearing on a report for being at risk of failing the adherence measure for cholesterol (statin) medications this calendar year.   Medication: atorvastatin  20mg   Last fill date: 05/05/24 for 90 day supply  Insurance report was not up to date. No action needed at this time.   Lang Sieve, PharmD, BCGP Clinical Pharmacist  [phone]

## 2024-05-17 DIAGNOSIS — Z933 Colostomy status: Secondary | ICD-10-CM | POA: Diagnosis not present

## 2024-05-17 NOTE — Telephone Encounter (Signed)
Pt son has been informed

## 2024-05-21 ENCOUNTER — Encounter: Payer: Self-pay | Admitting: Oncology

## 2024-05-25 ENCOUNTER — Telehealth: Admitting: Family Medicine

## 2024-05-25 ENCOUNTER — Other Ambulatory Visit: Payer: Self-pay | Admitting: Family Medicine

## 2024-05-25 DIAGNOSIS — I10 Essential (primary) hypertension: Secondary | ICD-10-CM

## 2024-05-25 DIAGNOSIS — Z933 Colostomy status: Secondary | ICD-10-CM

## 2024-05-25 DIAGNOSIS — R269 Unspecified abnormalities of gait and mobility: Secondary | ICD-10-CM | POA: Diagnosis not present

## 2024-05-25 DIAGNOSIS — Z741 Need for assistance with personal care: Secondary | ICD-10-CM | POA: Diagnosis not present

## 2024-05-25 DIAGNOSIS — Z23 Encounter for immunization: Secondary | ICD-10-CM

## 2024-05-25 NOTE — Progress Notes (Signed)
 Virtual Visit via Video Note  I connected with [name] on 05/25/24 at  2:00 PM EST by a video enabled telemedicine application and verified that I am speaking with the correct person using two identifiers.  Location: Patient: home Provider: office    I discussed the limitations of evaluation and management by telemedicine and the availability of in person appointments. The patient expressed understanding and agreed to proceed.  History of Present Illness: F/u visit via video , Has had partial colectomy 02/24/2024  due to perforated colitis and has a colostomy, will see Surgery in near future ,for follow up and to discuss  possible reversal Being treated for breast cancer had chemotherapy and is to have radiaition treatment Feels weak , and is requesting additional in home PT , no recent falls but unsteady on her feet with limited mobility Appetite is poor Requests that home health give her vaccines at home   Observations/Objective: There were no vitals taken for this visit. Fairly good communication chronically ill appearing Alert  No signs of respiratory distress during speech   Assessment and Plan: Gait disturbance Ongoing weakness and unsteady gait,  with need for additional physical therapy in home will request once weekly PT for an additional 8 weeks  Need for immunization against influenza Requests that this be administered in home by [facility] will see if this is possible, if not may get at pharmacy or in office as nurse visit  Requires daily assistance for activities of daily living (ADL) and comfort needs In home assistance required for care and comfort, patient incapable of carrying out ADL's independently  Essential hypertension Need accurate update on medications and recent vital signs  Colostomy status (HCC) Colostomy status to be reviewed by Surgeon in the next several weeks  Breast cancer of lower-inner quadrant of right female breast [facility]) Has had  chemo and  radiation is proposed will f/u with oncology as planned   Follow Up Instructions:    I discussed the assessment and treatment plan with the patient. The patient was provided an opportunity to ask questions and all were answered. The patient agreed with the plan and demonstrated an understanding of the instructions.   The patient was advised to call back or seek an in-person evaluation if the symptoms worsen or if the condition fails to improve as anticipated.  I provided 18 minutes of non-face-to-face time during this encounter.   Rollene Pesa, MD

## 2024-05-25 NOTE — Patient Instructions (Addendum)
 F/U in 4 months  You will be referred for in home once weekly PT for an additional 8 weeks due to weakness and  instability   Nurse pls see if you can arrange for her home health team to and minister flu vaccine at home also the covid vaccine at home, give them 10 to 14 days apart

## 2024-05-27 DIAGNOSIS — Z433 Encounter for attention to colostomy: Secondary | ICD-10-CM | POA: Diagnosis not present

## 2024-05-27 DIAGNOSIS — Z48815 Encounter for surgical aftercare following surgery on the digestive system: Secondary | ICD-10-CM | POA: Diagnosis not present

## 2024-05-27 DIAGNOSIS — I10 Essential (primary) hypertension: Secondary | ICD-10-CM

## 2024-05-27 DIAGNOSIS — E782 Mixed hyperlipidemia: Secondary | ICD-10-CM

## 2024-05-27 DIAGNOSIS — M549 Dorsalgia, unspecified: Secondary | ICD-10-CM

## 2024-05-27 DIAGNOSIS — M170 Bilateral primary osteoarthritis of knee: Secondary | ICD-10-CM

## 2024-05-27 DIAGNOSIS — J45909 Unspecified asthma, uncomplicated: Secondary | ICD-10-CM

## 2024-05-27 DIAGNOSIS — K5730 Diverticulosis of large intestine without perforation or abscess without bleeding: Secondary | ICD-10-CM | POA: Diagnosis not present

## 2024-05-27 DIAGNOSIS — K219 Gastro-esophageal reflux disease without esophagitis: Secondary | ICD-10-CM

## 2024-05-27 DIAGNOSIS — F330 Major depressive disorder, recurrent, mild: Secondary | ICD-10-CM

## 2024-05-27 DIAGNOSIS — E669 Obesity, unspecified: Secondary | ICD-10-CM

## 2024-05-27 DIAGNOSIS — C50611 Malignant neoplasm of axillary tail of right female breast: Secondary | ICD-10-CM | POA: Diagnosis not present

## 2024-06-03 ENCOUNTER — Telehealth: Payer: Self-pay

## 2024-06-03 NOTE — Telephone Encounter (Signed)
 Copied from CRM [id number]. Topic: Clinical - Medical Advice >> Jun 03, 2024 12:48 PM Nathanel BROCKS wrote: Reason for CRM: [facility] home [facility] went out today and the pt is complaining of bilateral thigh pain. She has been using biofreeze and it is just not work to benefit the pt. Is there anything that you can prescribe for Ms. Godbee that might would help with this.  Please call Lacie, [phone] [facility]

## 2024-06-07 ENCOUNTER — Ambulatory Visit (INDEPENDENT_AMBULATORY_CARE_PROVIDER_SITE_OTHER): Admitting: Surgery

## 2024-06-07 ENCOUNTER — Encounter: Payer: Self-pay | Admitting: Surgery

## 2024-06-07 VITALS — BP 132/72 | HR 72 | Temp 97.8°F | Ht 60.0 in | Wt 176.0 lb

## 2024-06-07 DIAGNOSIS — K5720 Diverticulitis of large intestine with perforation and abscess without bleeding: Secondary | ICD-10-CM

## 2024-06-07 DIAGNOSIS — Z433 Encounter for attention to colostomy: Secondary | ICD-10-CM | POA: Diagnosis not present

## 2024-06-07 DIAGNOSIS — Z933 Colostomy status: Secondary | ICD-10-CM

## 2024-06-07 NOTE — Patient Instructions (Addendum)
 We will see you in 3 months, our schedule is not yet available, so we placed you in our recall system and will send you a letter with your appointment information   Colostomy Home Guide, Adult  A colostomy is a surgically created opening (ostomy) that brings a piece of the large intestine through an opening in the abdomen to create a stoma. The stoma allows stool (feces) and gas (flatus) to leave the body. A stoma may be a variety of shapes and sizes. An ostomy pouch or bag is used to cover the stoma and to contain the stool and gas. A colostomy may be temporary or permanent, depending on the medical reason for your surgery. After surgery, you will need to learn to care for your ostomy. This includes emptying and changing the colostomy bag as needed. There are many different types of bags or pouching options, including one-piece, two-piece, clear, fabric covered, flat or curved (convex), drainable, and closed or vented options. Your health care provider will help you select the best pouch for you. How to care for the stoma Your stoma should look pink, red, and moist, like the inside of your cheek. It should not be painful to touch, but it may bleed easily when rubbed or bumped. Soon after surgery, the stoma may be swollen, but this goes away within 6 weeks. To care for the stoma: Keep the skin around the stoma clean and dry. Use a clean, soft washcloth to gently wash the stoma and the skin around it. Clean using a circular motion, and wipe away from the stoma opening, nottoward it. Use warm water  and only use cleansers, such as mild soap and stoma-safe adhesive remover, recommended by your health care provider. Inspect and dry the skin around the stoma. Use stoma powder or skin protectant on your skin as told by your health care provider. Do not use any other powders, gels, wipes, or creams on the skin around the stoma. These may keep pouch adhesive from sticking or may cause injury to your stoma. Check  the stoma area every day for signs of infection or problems. Check for: New or worsening redness, warmth, swelling, irritation, itching, or pain around the stoma. New or worsening changes to the stoma, such as bleeding, trauma, or a dark and dusky color. Changes to the drainage from the stoma, such as pus, blood, a large amount of liquid drainage, or little to no stool drainage. Measure the stoma opening regularly and record the size. Watch for changes, such as the stoma getting longer, becoming flat, or falling below skin level. (It is normal for the stoma to get smaller as the swelling goes away.) Share this information with your health care provider. Changes in the size or shape of the stoma may require a different style of pouch to be used. How to empty the colostomy bag     Empty your bag at bedtime, before physical activity or sexual relations, and whenever it is one-third to one-half full. Do not let the bag get more than half-full with stool or gas. The bag could leak if it gets too full. Some colostomy bags have a built-in gas release valve that releases gas often throughout the day. Follow these basic steps for emptying a drainable pouch: Prepare the toilet or container: If draining directly into the toilet, put several pieces of toilet paper into the toilet water . This will prevent splashing as you empty the stool into the toilet. Sit far back on the toilet seat. If  draining into a container, place a few pieces of moistened toilet paper in the bottom of the container. This can help when emptying the container. Remove the clip or the hook-and-loop fastener from the tail end of the bag. Unroll the tail, then empty the stool into the toilet or container. Clean the tail with toilet paper or a moist towelette. Reroll the tail, and close it with the clip or the hook-and-loop fastener. Wash your hands. How to change the colostomy bag Change your bag every 3-4 days or as often as told by your  health care provider. Also change the bag if it is leaking or separating from the skin, or if your skin around the stoma looks or feels irritated. Irritated skin may be a sign that the bag is leaking. Always have colostomy supplies with you, and follow these basic steps: Have paper towels or tissues and a trash bag nearby to clean any discharge and dispose of the soiled pouch. Remove the old pouching appliance. Use your fingers, a warm, moist cloth, or a stoma-safe adhesive remover to gently remove the pouch and remove adhesive residue. Clean the stoma area with water  or with mild soap and water , as directed. Use water  to rinse away any soap. Dry the skin. You may use the cool setting on a hair dryer to do this. Use a tracing pattern (template) to cut the skin barrier to the size needed. The opening should be large enough to fit around the stoma, but you should not see exposed skin. If you are using a two-piece bag, attach the bag and the base to each other. Add the barrier ring, if you use one. If directed, apply stoma powder or skin protectant to the skin. Warm the pouch base with your hands or blow with a hair dryer for 5-10 seconds. Remove the paper from the adhesive backing of the pouch base. Press the adhesive backing onto the skin around the stoma. Gently rub the pouch base onto the skin. This creates heat that helps the barrier to stick. Apply barrier strips to the edges of the pouch, if desired. Dispose of the soiled pouch, and wash your hands. General recommendations Avoid wearing tight clothes or having anything press directly on your stoma or bag. Change your clothing whenever it is soiled or damp. You may shower or bathe with the bag on or off. Do not use harsh or oily soaps or lotions. Dry the skin and bag after bathing. Do not shower or bathe right after changing the pouch. Wait 4-5 hours. Store all supplies in a cool, dry place. Do not leave supplies in extreme heat because some  parts can melt or not stick as well. Whenever you leave home, take extra clothing and an extra ostomy pouch with you. If your bag gets wet, you can dry it with a hair dryer on the cool setting. To prevent odor, you may put drops of ostomy deodorizer in the bag. If recommended by your health care provider, put ostomy lubricant inside the bag. This helps stool slide out of the bag more easily and completely. Contact a health care provider if: You have new or worsening redness, warmth, swelling, irritation, itching, or pain around the stoma. You have new or worsening changes to the stoma, such as bleeding, trauma, or a dark and dusky color. There are changes to the drainage from the stoma, such as pus, blood, a large amount of liquid drainage, or little to no stool drainage. Your stoma extends in or  out farther than normal. You need to change your bag every day or have frequent leaks. You have a fever. Get help right away if: Your stool is bloody. You have nausea or you vomit. You have trouble breathing. These symptoms may be an emergency. Get help right away. Call 911. Do not wait to see if the symptoms will go away. Do not drive yourself to the hospital. Summary Measure your stoma opening regularly and record the size. Watch for changes. Empty your bag at bedtime, before physical activity or sexual relations, and whenever it is one-third to one-half full. Do not let the bag get more than half-full with stool or gas. Change your bag every 3-4 days or as often as told by your health care provider. Whenever you leave home, take extra clothing and an extra ostomy pouch with you. This information is not intended to replace advice given to you by your health care provider. Make sure you discuss any questions you have with your health care provider. Document Revised: 07/18/2021 Document Reviewed: 07/18/2021 Elsevier Patient Education  2024 [employer].

## 2024-06-08 ENCOUNTER — Encounter: Payer: Self-pay | Admitting: Surgery

## 2024-06-08 NOTE — Progress Notes (Signed)
 Outpatient Surgical Follow Up  06/08/2024  [name] is an 78 y.o. female.   Chief Complaint  Patient presents with   Routine Post Op    HPI:  [name] is 3 months out  from hartmann's due to perf diverticulitis. recent chemo for breast CA, SHe si doing ok considering her fragility and chronic disease. Ostomy is working and tolerating PO , she is now at home. I talked to the care giver and the son over the phone . She did not complete XRT but does have a n appt to see them soon. I had an extensive discussion with the patient and also the son.  I do think that she needs to complete treatment for breast cancer as this is incomplete.  Doing an ostomy reversal is a purely elective procedure.  She needs to improve from a physiologic perspective and from a pregnancy perspective she needs to make sure that she is free of active cancer before entertaining colostomy reversal.   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   Cancer (HCC)    Depression    Diverticulosis of colon    GERD (gastroesophageal reflux disease)    Hyperlipidemia    Hypertension    Obesity    Osteoarthritis of knees, bilateral    Sleep apnea     Past Surgical History:  Procedure Laterality Date   ABDOMINAL HYSTERECTOMY     AXILLARY SENTINEL NODE BIOPSY Right 10/22/2023   Procedure: BIOPSY, LYMPH NODE, SENTINEL, AXILLARY;  Surgeon: Evonnie Dorothyann LABOR, DO;  Location: [facility];  Service: General;  Laterality: Right;   BREAST BIOPSY Right 09/09/2023   US  RT BREAST BX W LOC DEV 1ST LESION IMG BX SPEC US  GUIDE 09/09/2023 Lennon Nest, MD [facility]   BREAST LUMPECTOMY WITH RADIO FREQUENCY LOCALIZER Right 10/22/2023   Procedure: BREAST LUMPECTOMY WITH RADIO FREQUENCY LOCALIZER;  Surgeon: Evonnie Dorothyann LABOR, DO;  Location: [facility];  Service: General;  Laterality: Right;   COLECTOMY WITH COLOSTOMY CREATION/HARTMANN PROCEDURE N/A 02/24/2024   Procedure:  COLECTOMY, WITH COLOSTOMY CREATION;  Surgeon: Jordis Laneta FALCON, MD;  Location: [facility];  Service: General;  Laterality: N/A;   COLONOSCOPY  07/23/2003   Dr. Keven Smith:numerous large scattered diverticula   COLONOSCOPY N/A 11/15/2013   Dr. Harvey: moderate diverticula, small internal hemorrhoids, redudant colon. Next TCS in 2025 with overtube.    ESOPHAGOGASTRODUODENOSCOPY (EGD) WITH ESOPHAGEAL DILATION N/A 11/15/2013   Dr. Harvey: stricture at GE junction s/p dilation. moderate erosive gastritis, negative H.pylori   MULTIPLE TOOTH EXTRACTIONS  2024   PORTACATH PLACEMENT Left 10/22/2023   Procedure: INSERTION, TUNNELED CENTRAL VENOUS DEVICE, WITH PORT;  Surgeon: Evonnie Dorothyann LABOR, DO;  Location: [facility];  Service: General;  Laterality: Left;   TOTAL KNEE ARTHROPLASTY  06/01/2004   left / Dr. Margrette   TOTAL KNEE ARTHROPLASTY  11/29/2003   right / Dr. Margrette   TUBAL LIGATION      Family History  Problem Relation Age of Onset   Diabetes Mother    Hypertension Mother    Heart disease Mother    Aneurysm Father    Diabetes Father    Hypertension Brother    Diabetes Brother    Diabetes Brother    Diabetes Brother    Hypertension Brother    Breast cancer Maternal Aunt        dx >50   Cancer Maternal Aunt  unknown type; ? breast; dx >50?   Prostate cancer Maternal Uncle    Hypertension Son     Social History:  reports that she has never smoked. She has never been exposed to tobacco smoke. She has never used smokeless tobacco. She reports that she does not drink alcohol and does not use drugs.  Allergies: No Known Allergies  Medications reviewed.    ROS Full ROS performed and is otherwise negative other than what is stated in HPI   BP 132/72   Pulse 72   Temp 97.8 F (36.6 C) (Oral)   Ht 5' (1.524 m)   Wt 176 lb (79.8 kg)   SpO2 96%   BMI 34.37 kg/m   Physical Exam Vitals and nursing note reviewed. Exam conducted with a chaperone present.   Constitutional:      General: She is not in acute distress.    Appearance: Normal appearance. She is not ill-appearing or toxic-appearing.  Cardiovascular:     Rate and Rhythm: Normal rate and regular rhythm.     Heart sounds: No murmur heard. Pulmonary:     Effort: Pulmonary effort is normal. No respiratory distress.     Breath sounds: Normal breath sounds. No stridor. No wheezing or rhonchi.  Abdominal:     General: Abdomen is flat. There is no distension.     Palpations: Abdomen is soft. There is no mass.     Tenderness: There is no abdominal tenderness. There is no guarding or rebound.     Hernia: No hernia is present.  Musculoskeletal:        General: No swelling or tenderness. Normal range of motion.     Cervical back: Normal range of motion and neck supple. No rigidity or tenderness.  Skin:    General: Skin is warm and dry.     Capillary Refill: Capillary refill takes less than 2 seconds.  Neurological:     General: No focal deficit present.     Mental Status: She is alert and oriented to person, place, and time.  Psychiatric:        Mood and Affect: Mood normal.        Behavior: Behavior normal.        Thought Content: Thought content normal.        Judgment: Judgment normal.    Assessment/Plan: 78 year old female with breast cancer status postlumpectomy chemotherapy with pending completion of radiation therapy.  She follows me for diverticulitis and had a Hartman's procedure more than 3 months ago.  She is doing well from abdominal perspective.  I will be happy to see her I personally spent a total of 30 minutes in the care of the patient today including performing a medically appropriate exam/evaluation, counseling and educating, placing orders, referring and communicating with other health care professionals, documenting clinical information in the EHR, independently interpreting and reviewing images studies and coordinating care.   Laneta Luna, MD [facility] General  Surgeon

## 2024-06-10 ENCOUNTER — Telehealth: Payer: Self-pay | Admitting: Family Medicine

## 2024-06-10 ENCOUNTER — Other Ambulatory Visit: Payer: Self-pay

## 2024-06-10 NOTE — Telephone Encounter (Signed)
 Copied from CRM [id number]. Topic: General - Other >> Jun 10, 2024  1:14 PM Zebedee SAUNDERS wrote: Reason for CRM: [facility], per [facility] ph: [phone] stated pt is still in pain when she walks. Pt needs something stronger than the arthritis Tylenol .  Please call pt at [phone]. [facility] - [address] [place] [address] Phone: [phone] Fax: [phone].  [facility] stated they do not have any vaccines but if the pt is sent the vaccine she can administer the vaccines. Covid and Flu shots

## 2024-06-15 ENCOUNTER — Encounter: Payer: Self-pay | Admitting: Family Medicine

## 2024-06-15 ENCOUNTER — Telehealth: Payer: Self-pay | Admitting: Family Medicine

## 2024-06-15 ENCOUNTER — Telehealth: Payer: Self-pay

## 2024-06-15 ENCOUNTER — Ambulatory Visit: Payer: Self-pay

## 2024-06-15 DIAGNOSIS — Z23 Encounter for immunization: Secondary | ICD-10-CM | POA: Insufficient documentation

## 2024-06-15 NOTE — Assessment & Plan Note (Signed)
 Need accurate update on medications and recent vital signs

## 2024-06-15 NOTE — Assessment & Plan Note (Signed)
 Requests that this be administered in home by [facility] will see if this is possible, if not may get at pharmacy or in office as nurse visit

## 2024-06-15 NOTE — Telephone Encounter (Signed)
 Copied from CRM [id number]. Topic: Clinical - Medication Question >> Jun 15, 2024 11:42 AM Antwanette L wrote: Reason for CRM: [name], the patient son is calling to speak with Leotis regarding mediciation. I tried to assist as much as I could but [name] only wants to speak with Leotis. [name] is requesting a callback at [phone]

## 2024-06-15 NOTE — Assessment & Plan Note (Signed)
 Has had  chemo and radiation is proposed will f/u with oncology as planned

## 2024-06-15 NOTE — Telephone Encounter (Signed)
 FYI Only or Action Required?: Action required by provider: clinical question for provider and update on patient condition.  Patient was last seen in primary care on 05/25/2024 by Antonetta Rollene BRAVO, MD.  Called Nurse Triage reporting Leg Pain and Groin Pain.  Symptoms began ongoing, worsened a week ago.  Interventions attempted: OTC medications: Tylenol  Arthritis .  Symptoms are: gradually worsening.  Triage Disposition: See PCP When Office is Open (Within 3 Days)  Patient/caregiver understands and will follow disposition?: No, refuses disposition                               1. ONSET: When did the pain start?      Ongoing, worsened within last week 2. LOCATION: Where is the pain located?      Bilateral inner thighs and groin area 3. PAIN: How bad is the pain?    (Scale 1-10; or mild, moderate, severe)     Rates pain a 9, reports pain makes walking difficult 5. CAUSE: What do you think is causing the leg pain?     Arthritis  6. OTHER SYMPTOMS: Do you have any other symptoms? (e.g., chest pain, back pain, breathing difficulty, swelling, rash, fever, numbness, weakness)     Mild cough Denies redness, denies swelling, denies numbness, denies fever, denies discoloration, denies difficulty breathing, denies chest pain    This RN spoke to Beverley, the patient's home health nurse. Patient is reporting worsening pain. Nurse stated that patient has seen a provider for this pain before, but it pain is worsening and Tylenol  is providing minimal relief. This RN advised in-person evaluation for worsening symptoms. Patient declined and stated she does not have transportation. Please advise on pain management.   Copied from CRM #[id number]. Topic: Clinical - Home Health Verbal Orders >> Jun 15, 2024 11:12 AM Delon DASEN wrote: Caller/Agency: Beverley with [facility] Callback Number: [phone] Service Requested: Skilled Nursing Frequency:n/a Any new  concerns about the patient? Yes- patient needs something for pain in legs going up to genital area- trouble walking- pain level 9  Reason for Disposition  [1] MODERATE pain (e.g., interferes with normal activities, limping) AND [2] present > 3 days  Protocols used: Leg Pain-A-AH

## 2024-06-15 NOTE — Telephone Encounter (Signed)
 Spoke to hh nurse and family, areegable to trying gabapentin  100mg  twice daily. Will call back if no improvement

## 2024-06-15 NOTE — Assessment & Plan Note (Signed)
 Colostomy status to be reviewed by Surgeon in the next several weeks

## 2024-06-15 NOTE — Assessment & Plan Note (Signed)
 In home assistance required for care and comfort, patient incapable of carrying out ADL's independently

## 2024-06-15 NOTE — Telephone Encounter (Signed)
 Copied from CRM [id number]. Topic: Clinical - Medication Question >> Jun 15, 2024 11:42 AM Antwanette L wrote: Reason for CRM: [name], the patient son is calling to speak with Leotis regarding mediciation. I tried to assist as much as I could but [name] only wants to speak with Leotis. [name] is requesting a callback at [phone] >> Jun 15, 2024  3:07 PM Tiffini S wrote: [name] Louder, Mickey- patient son called back asking to talk with Leotis- called CAL, was told that Leotis is not in the office today. Refused to discuss reason for call/ said that he must talk with Leotis- she is the only person that can handle and resolve the concerns he have for the patient. Please call to follow up.

## 2024-06-15 NOTE — Telephone Encounter (Signed)
 See other note

## 2024-06-15 NOTE — Telephone Encounter (Signed)
 Please speak with H/H nurse  and pt / son re pain management for foot pain, Ms timoney has bedtime gabapentin  100 mg, if she rests well and pain is mainly in the daytime, I recommend increasing the dose to twice daily and will adbvise this , send message back or call in 7 to 10 days to see if beneficial , then I will send in a new rx, she just received a 90 day supply of this med on 10/15 so she does have meds available currently I reviewed her record, she had a normal x ray of the foot in past several months, however podiatry evaluation for foot pain is recommended and I will referif they agree  Pals also review the response re f;lu vaccine Should also have referral for once weekly h/h to continue for an additional 8 weeks Thanks!

## 2024-06-15 NOTE — Assessment & Plan Note (Signed)
 Ongoing weakness and unsteady gait,  with need for additional physical therapy in home will request once weekly PT for an additional 8 weeks

## 2024-06-16 ENCOUNTER — Telehealth: Admitting: Physician Assistant

## 2024-06-16 DIAGNOSIS — J069 Acute upper respiratory infection, unspecified: Secondary | ICD-10-CM

## 2024-06-16 MED ORDER — FLUTICASONE PROPIONATE 50 MCG/ACT NA SUSP: 2.0000 | Freq: Every day | NASAL | 0 refills | Status: AC

## 2024-06-16 MED ORDER — BENZONATATE 100 MG PO CAPS: 100.0000 mg | ORAL_CAPSULE | Freq: Three times a day (TID) | ORAL | 0 refills | Status: DC | PRN

## 2024-06-16 NOTE — Progress Notes (Signed)
 Virtual Visit Consent   [name], you are scheduled for a virtual visit with a [facility] provider today. Just as with appointments in the office, your consent must be obtained to participate. Your consent will be active for this visit and any virtual visit you may have with one of our providers in the next 365 days. If you have a MyChart account, a copy of this consent can be sent to you electronically.  As this is a virtual visit, video technology does not allow for your provider to perform a traditional examination. This may limit your provider's ability to fully assess your condition. If your provider identifies any concerns that need to be evaluated in person or the need to arrange testing (such as labs, EKG, etc.), we will make arrangements to do so. Although advances in technology are sophisticated, we cannot ensure that it will always work on either your end or our end. If the connection with a video visit is poor, the visit may have to be switched to a telephone visit. With either a video or telephone visit, we are not always able to ensure that we have a secure connection.  By engaging in this virtual visit, you consent to the provision of healthcare and authorize for your insurance to be billed (if applicable) for the services provided during this visit. Depending on your insurance coverage, you may receive a charge related to this service.  I need to obtain your verbal consent now. Are you willing to proceed with your visit today? [name] has provided verbal consent on 06/16/2024 for a virtual visit (video or telephone). [name], [place]  Date: 06/16/2024 4:54 PM   Virtual Visit via Video Note   I, [name], connected with  GEORGIAN MCCLORY  ([id number], [birth year]) on 06/16/24 at  4:45 PM EST by a video-enabled telemedicine application and verified that I am speaking with the correct person using two identifiers.  Location: Patient: Virtual Visit  Location Patient: Home Provider: Virtual Visit Location Provider: Home Office   I discussed the limitations of evaluation and management by telemedicine and the availability of in person appointments. The patient expressed understanding and agreed to proceed.    History of Present Illness: [name] is a 78 y.o. who identifies as a female who was assigned female at birth, and is being seen today for 2 days of nasal congestion, chest congestion and dry cough. Notes having flu and COVID vaccines on Monday but felt fine right after. Denies fever. Some occasional cold intolerance but not new for her. Denies recent travel or sick contact. Wears nighttime O2. Denies any increased work of breathing. [name]  OTC -- Nyquil/Dayquil  HPI: HPI  Problems:  Patient Active Problem List   Diagnosis Date Noted   Need for immunization against influenza 06/15/2024   Irritant contact dermatitis associated with fecal stoma 04/19/2024   Colostomy complication (HCC) 04/19/2024   Slow transit constipation 04/19/2024   Colostomy status (HCC) 03/31/2024   Gait disturbance 03/31/2024   Debility 03/31/2024   Peripheral neuropathy 03/17/2024   Severe sepsis (HCC) 02/23/2024   Requires daily assistance for activities of daily living (ADL) and comfort needs 02/17/2024   Encounter for support and coordination of transition of care 02/17/2024   Diverticulitis 02/03/2024   Acute diverticulitis 01/25/2024   Invasive ductal carcinoma of breast, female, right (HCC) 10/22/2023   BRCA2 gene mutation positive 10/13/2023   Genetic testing 10/10/2023   Breast cancer of lower-inner quadrant  of right female breast (HCC) 09/16/2023   Depression, major, single episode, severe (HCC) 08/25/2023   Complicated grief 05/12/2023   Insomnia due to anxiety and fear 02/01/2023   Decreased hearing 12/15/2022   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   Thrombocytopenia 12/03/2022   Hypokalemia 12/01/2022   Hypomagnesemia  12/01/2022   Transient alteration of awareness 12/01/2022   Bilateral lower extremity edema 10/18/2022   Fatigue due to excessive exertion 06/25/2022   Reduced vision 06/18/2021   Osteopenia 06/17/2021   Upper airway cough syndrome 06/13/2021   Chronic respiratory failure with hypoxia (HCC) 01/29/2021   DOE (dyspnea on exertion) 01/29/2021   Bilateral shoulder pain 08/14/2020   Trigger finger, left little finger 06/09/2019   Alteration in skin integrity due to moisture 03/30/2019   Chronic right shoulder pain 07/05/2018   Intermittent knee pain 05/28/2018   Vitamin D  deficiency 11/22/2015   Back pain 09/15/2013   Dermatomycosis 12/22/2012   Carpal tunnel syndrome on left 07/31/2011   Prediabetes 10/16/2007   Hyperlipidemia LDL goal <100 10/16/2007   Morbid obesity due to excess calories (HCC) complicated by hbp/hyperlipidemia 10/16/2007   Anxiety and depression 10/16/2007   Essential hypertension 10/16/2007   Pulmonary hypertension (HCC) 10/16/2007   GERD 10/16/2007   Diverticulosis of colon 10/16/2007   OSTEOARTHRITIS, KNEES, 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    Allergies: No Known Allergies Medications:  Current Outpatient Medications:    benzonatate  (TESSALON ) 100 MG capsule, Take 1 capsule (100 mg total) by mouth 3 (three) times daily as needed for cough., Disp: 30 capsule, Rfl: 0   fluticasone  (FLONASE ) 50 MCG/ACT nasal spray, Place 2 sprays into both nostrils daily., Disp: 16 g, Rfl: 0   albuterol  (VENTOLIN  HFA) 108 (90 Base) MCG/ACT inhaler, Inhale 2 puffs into the lungs every 6 (six) hours as needed for wheezing or shortness of breath., Disp: 8.5 g, Rfl: 1   amLODipine  (NORVASC ) 10 MG tablet, Take 10 mg by mouth daily., Disp: , Rfl:    aspirin EC 81 MG tablet, Take 81 mg by mouth daily. Swallow whole., Disp: , Rfl:    atorvastatin  (LIPITOR) 20 MG tablet, Take 1 tablet (20 mg total) by mouth daily., Disp: 90 tablet, Rfl: 3   BIOTIN PO, Take 1 tablet by mouth daily., Disp: , Rfl:     bisacodyl  5 MG EC tablet, Take one tablet by mouth every 3 days as needed, for constipation. Repeat once after 6 hours if still no bowel movement, Disp: 30 tablet, Rfl: 0   Cholecalciferol (VITAMIN D3) 125 MCG (5000 UT) CAPS, Take 1 capsule (5,000 Units total) by mouth daily., Disp: 30 capsule, Rfl: 11   cloNIDine  (CATAPRES ) 0.2 MG tablet, Take 0.2 mg by mouth daily., Disp: , Rfl:    clotrimazole -betamethasone  (LOTRISONE ) cream, Apply cream twice daily to rash in groin for 10 days, then as needed, Disp: 45 g, Rfl: 1   docusate sodium  (COLACE) 100 MG capsule, Take 1 capsule (100 mg total) by mouth 2 (two) times daily., Disp: 60 capsule, Rfl: 2   famotidine (PEPCID) 10 MG tablet, Take 10 mg by mouth 2 (two) times daily., Disp: , Rfl:    furosemide  (LASIX ) 40 MG tablet, Take 40 mg by mouth daily as needed., Disp: , Rfl:    gabapentin  (NEURONTIN ) 100 MG capsule, TAKE ONE CAPSULE BY MOUTH AT BEDTIME, Disp: 90 capsule, Rfl: 1   Hydrocortisone (GERHARDT'S BUTT CREAM) CREA, Apply 1 application topically 3 (three) times daily., Disp: 1 each, Rfl: 1   losartan -hydrochlorothiazide  (HYZAAR) 100-25 MG tablet,  Take 1 tablet by mouth daily., Disp: , Rfl:    magnesium  30 MG tablet, Take one tablet by mouth once daily, Disp: 30 tablet, Rfl: 2   megestrol  (MEGACE ) 40 MG/ML suspension, Take 10 mLs (400 mg total) by mouth 2 (two) times daily., Disp: 480 mL, Rfl: 0   Melatonin 3 MG CAPS, Take 1 capsule (3 mg total) by mouth at bedtime., Disp: 30 capsule, Rfl: 3   metoprolol  succinate (TOPROL -XL) 50 MG 24 hr tablet, TAKE 1 TABLET BY MOUTH  DAILY WITH OR IMMEDIATELY  FOLLOWING A MEAL, Disp: 100 tablet, Rfl: 2   olopatadine  (PATANOL) 0.1 % ophthalmic solution, Place 1 drop into both eyes 2 (two) times daily., Disp: 5 mL, Rfl: 6   omeprazole  (PRILOSEC) 20 MG capsule, Take 1 capsule (20 mg total) by mouth daily., Disp: 100 capsule, Rfl: 2   polyethylene glycol powder (GLYCOLAX /MIRALAX ) 17 GM/SCOOP powder, Take 17 g by mouth 2  (two) times daily as needed., Disp: 3350 g, Rfl: 3   potassium chloride  SA (KLOR-CON  M) 20 MEQ tablet, Take 20 mEq by mouth daily., Disp: , Rfl:    prochlorperazine  (COMPAZINE ) 10 MG tablet, Take 1 tablet (10 mg total) by mouth every 6 (six) hours as needed for nausea or vomiting., Disp: 60 tablet, Rfl: 3   pyridOXINE  (VITAMIN B-6) 100 MG tablet, Take 200 mg by mouth daily., Disp: , Rfl:    sertraline  (ZOLOFT ) 50 MG tablet, Take 1 tablet (50 mg total) by mouth daily., Disp: 30 tablet, Rfl: 3   temazepam  (RESTORIL ) 15 MG capsule, Take 1 capsule (15 mg total) by mouth at bedtime as needed for sleep., Disp: 30 capsule, Rfl: 0  Observations/Objective: Patient is well-developed, well-nourished in no acute distress.  Resting comfortably  at home.  Head is normocephalic, atraumatic.  No labored breathing.  Speech is clear and coherent with logical content.  Patient is alert and oriented at baseline.   Assessment and Plan: 1. Viral URI with cough (Primary) - benzonatate  (TESSALON ) 100 MG capsule; Take 1 capsule (100 mg total) by mouth 3 (three) times daily as needed for cough.  Dispense: 30 capsule; Refill: 0 - fluticasone  (FLONASE ) 50 MCG/ACT nasal spray; Place 2 sprays into both nostrils daily.  Dispense: 16 g; Refill: 0  Discussed viral URI most likely. Supportive measures and OTC medications reviewed. Will add-on Tessalon  and Flonase . Strict in-person evaluation precautions reviewed with patient and family.   Follow Up Instructions: I discussed the assessment and treatment plan with the patient. The patient was provided an opportunity to ask questions and all were answered. The patient agreed with the plan and demonstrated an understanding of the instructions.  A copy of instructions were sent to the patient via MyChart unless otherwise noted below.   The patient was advised to call back or seek an in-person evaluation if the symptoms worsen or if the condition fails to improve as anticipated.     Mallory Velma Lunger, PA-C

## 2024-06-16 NOTE — Patient Instructions (Signed)
 Doyal CHRISTELLA Louder, thank you for joining [name], PA-C for today's virtual visit.  While this provider is not your primary care provider (PCP), if your PCP is located in our provider database this encounter information will be shared with them immediately following your visit.   A [place] MyChart account gives you access to today's visit and all your visits, tests, and labs performed at [facility]  click here if you don't have a [facility] MyChart account or go to [email or web address]  Consent: (Patient) [name] provided verbal consent for this virtual visit at the beginning of the encounter.  Current Medications:  Current Outpatient Medications:    albuterol  (VENTOLIN  HFA) 108 (90 Base) MCG/ACT inhaler, Inhale 2 puffs into the lungs every 6 (six) hours as needed for wheezing or shortness of breath., Disp: 8.5 g, Rfl: 1   amLODipine  (NORVASC ) 10 MG tablet, Take 10 mg by mouth daily., Disp: , Rfl:    aspirin EC 81 MG tablet, Take 81 mg by mouth daily. Swallow whole., Disp: , Rfl:    atorvastatin  (LIPITOR) 20 MG tablet, Take 1 tablet (20 mg total) by mouth daily., Disp: 90 tablet, Rfl: 3   BIOTIN PO, Take 1 tablet by mouth daily., Disp: , Rfl:    bisacodyl  5 MG EC tablet, Take one tablet by mouth every 3 days as needed, for constipation. Repeat once after 6 hours if still no bowel movement, Disp: 30 tablet, Rfl: 0   Cholecalciferol (VITAMIN D3) 125 MCG (5000 UT) CAPS, Take 1 capsule (5,000 Units total) by mouth daily., Disp: 30 capsule, Rfl: 11   cloNIDine  (CATAPRES ) 0.2 MG tablet, Take 0.2 mg by mouth daily., Disp: , Rfl:    clotrimazole -betamethasone  (LOTRISONE ) cream, Apply cream twice daily to rash in groin for 10 days, then as needed, Disp: 45 g, Rfl: 1   docusate sodium  (COLACE) 100 MG capsule, Take 1 capsule (100 mg total) by mouth 2 (two) times daily., Disp: 60 capsule, Rfl: 2   famotidine (PEPCID) 10 MG tablet, Take 10 mg by mouth 2 (two) times  daily., Disp: , Rfl:    furosemide  (LASIX ) 40 MG tablet, Take 40 mg by mouth daily as needed., Disp: , Rfl:    gabapentin  (NEURONTIN ) 100 MG capsule, TAKE ONE CAPSULE BY MOUTH AT BEDTIME, Disp: 90 capsule, Rfl: 1   Hydrocortisone (GERHARDT'S BUTT CREAM) CREA, Apply 1 application topically 3 (three) times daily., Disp: 1 each, Rfl: 1   losartan -hydrochlorothiazide  (HYZAAR) 100-25 MG tablet, Take 1 tablet by mouth daily., Disp: , Rfl:    magnesium  30 MG tablet, Take one tablet by mouth once daily, Disp: 30 tablet, Rfl: 2   megestrol  (MEGACE ) 40 MG/ML suspension, Take 10 mLs (400 mg total) by mouth 2 (two) times daily., Disp: 480 mL, Rfl: 0   Melatonin 3 MG CAPS, Take 1 capsule (3 mg total) by mouth at bedtime., Disp: 30 capsule, Rfl: 3   metoprolol  succinate (TOPROL -XL) 50 MG 24 hr tablet, TAKE 1 TABLET BY MOUTH  DAILY WITH OR IMMEDIATELY  FOLLOWING A MEAL, Disp: 100 tablet, Rfl: 2   olopatadine  (PATANOL) 0.1 % ophthalmic solution, Place 1 drop into both eyes 2 (two) times daily., Disp: 5 mL, Rfl: 6   omeprazole  (PRILOSEC) 20 MG capsule, Take 1 capsule (20 mg total) by mouth daily., Disp: 100 capsule, Rfl: 2   polyethylene glycol powder (GLYCOLAX /MIRALAX ) 17 GM/SCOOP powder, Take 17 g by mouth 2 (two) times daily as needed., Disp: 3350 g, Rfl: 3  potassium chloride  SA (KLOR-CON  M) 20 MEQ tablet, Take 20 mEq by mouth daily., Disp: , Rfl:    prochlorperazine  (COMPAZINE ) 10 MG tablet, Take 1 tablet (10 mg total) by mouth every 6 (six) hours as needed for nausea or vomiting., Disp: 60 tablet, Rfl: 3   pyridOXINE  (VITAMIN B-6) 100 MG tablet, Take 200 mg by mouth daily., Disp: , Rfl:    sertraline  (ZOLOFT ) 50 MG tablet, Take 1 tablet (50 mg total) by mouth daily., Disp: 30 tablet, Rfl: 3   temazepam  (RESTORIL ) 15 MG capsule, Take 1 capsule (15 mg total) by mouth at bedtime as needed for sleep., Disp: 30 capsule, Rfl: 0   Medications ordered in this encounter:  No orders of the defined types were placed  in this encounter.    *If you need refills on other medications prior to your next appointment, please contact your pharmacy*  Follow-Up: Call back or seek an in-person evaluation if the symptoms worsen or if the condition fails to improve as anticipated.  [facility] [phone]  Other Instructions Hydrate and rest. Ok to use plain Mucinex OTC. Continue your inhaler as directed, when needed. Take the newly prescribed medications as directed. If you note any non-resolving, new, or worsening symptoms despite treatment, please seek an in-person evaluation ASAP.    If you have been instructed to have an in-person evaluation today at a local Urgent Care facility, please use the link below. It will take you to a list of all of our available [facility], including address, phone number and hours of operation. Please do not delay care.  [facility]  If you or a family member do not have a primary care provider, use the link below to schedule a visit and establish care. When you choose a [facility] primary care physician or advanced practice provider, you gain a long-term partner in health. Find a Primary Care Provider  Learn more about [facility]'s in-office and virtual care options:  - Get Care Now

## 2024-06-22 ENCOUNTER — Other Ambulatory Visit: Payer: Self-pay | Admitting: *Deleted

## 2024-06-22 NOTE — Patient Outreach (Signed)
 Complex Care Management   Visit Note  06/22/2024  Name:  [name] MRN: [id number] DOB: [birth year]  Situation: Referral received for Complex Care Management related to Requires daily assistance for activities of daily living (ADL) and comfort needs  I obtained verbal consent from Patient.  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   Cancer (HCC)    Depression    Diverticulosis of colon    GERD (gastroesophageal reflux disease)    Hyperlipidemia    Hypertension    Obesity    Osteoarthritis of knees, bilateral    Sleep apnea     Assessment: Patient Reported Symptoms:  Cognitive Cognitive Status: No symptoms reported Cognitive/Intellectual Conditions Management [RPT]: None reported or documented in medical history or problem list   Health Maintenance Behaviors: Annual physical exam Healing Pattern: Average Health Facilitated by: Rest  Neurological Neurological Review of Symptoms: Weakness Neurological Management Strategies: Routine screening Neurological Self-Management Outcome: 3 (uncertain)  HEENT HEENT Symptoms Reported: No symptoms reported HEENT Management Strategies: Routine screening HEENT Self-Management Outcome: 4 (good)    Cardiovascular Cardiovascular Symptoms Reported: No symptoms reported Does patient have uncontrolled Hypertension?: No Cardiovascular Management Strategies: Routine screening Cardiovascular Self-Management Outcome: 4 (good)  Respiratory Respiratory Symptoms Reported: Productive cough, Wheezing Respiratory Management Strategies: Routine screening, Medication therapy, Oxygen  therapy Respiratory Self-Management Outcome: 4 (good)  Endocrine Endocrine Symptoms Reported: No symptoms reported Is patient diabetic?: No Endocrine Self-Management Outcome: 4 (good)  Gastrointestinal Gastrointestinal Symptoms Reported: No symptoms  reported Gastrointestinal Management Strategies: Colostomy    Genitourinary Genitourinary Symptoms Reported: No symptoms reported Genitourinary Management Strategies: Incontinence garment/pad Genitourinary Self-Management Outcome: 4 (good)  Integumentary Integumentary Symptoms Reported: No symptoms reported Skin Management Strategies: Routine screening Skin Self-Management Outcome: 4 (good)  Musculoskeletal Musculoskelatal Symptoms Reviewed: Muscle pain, Limited mobility, Difficulty walking, Weakness, Unsteady gait Musculoskeletal Management Strategies: Medical device, Routine screening Musculoskeletal Self-Management Outcome: 4 (good) Falls in the past year?: No Number of falls in past year: 1 or less Was there an injury with Fall?: No Fall Risk Category Calculator: 0 Patient Fall Risk Level: Low Fall Risk Patient at Risk for Falls Due to: No Fall Risks Fall risk Follow up: Falls evaluation completed  Psychosocial Psychosocial Symptoms Reported: No symptoms reported Behavioral Management Strategies: Support system Behavioral Health Self-Management Outcome: 4 (good) Major Change/Loss/Stressor/Fears (CP): Denies Techniques to Cope with Loss/Stress/Change: Not applicable Quality of Family Relationships: helpful, involved, supportive Do you feel physically threatened by others?: No    06/22/2024    PHQ2-9 Depression Screening   Little interest or pleasure in doing things Not at all  Feeling down, depressed, or hopeless Not at all  PHQ-2 - Total Score 0  Trouble falling or staying asleep, or sleeping too much    Feeling tired or having little energy    Poor appetite or overeating     Feeling bad about yourself - or that you are a failure or have let yourself or your family down    Trouble concentrating on things, such as reading the newspaper or watching television    Moving or speaking so slowly that other people could have noticed.  Or the opposite - being so fidgety or restless  that you have been moving around a lot more than usual    Thoughts that you would be better off dead, or hurting yourself in some way  PHQ2-9 Total Score    If you checked off any problems, how difficult have these problems made it for you to do your work, take care of things at home, or get along with other people    Depression Interventions/Treatment      There were no vitals filed for this visit. Pain Scale: 0-10 Pain Score: 8  Pain Type: Chronic pain Pain Location: Generalized  Medications Reviewed Today     Reviewed by Bertrum Rosina HERO, RN (Registered Nurse) on 06/22/24 at 1328  Med List Status: <None>   Medication Order Taking? Sig Documenting Provider Last Dose Status Informant  albuterol  (VENTOLIN  HFA) 108 (90 Base) MCG/ACT inhaler [id number] Yes Inhale 2 puffs into the lungs every 6 (six) hours as needed for wheezing or shortness of breath. Antonetta Rollene BRAVO, MD  Active   amLODipine  (NORVASC ) 10 MG tablet [id number] Yes Take 10 mg by mouth daily. [provider]  Active Self  aspirin EC 81 MG tablet [id number] Yes Take 81 mg by mouth daily. Swallow whole. [provider]  Active Child  atorvastatin  (LIPITOR) 20 MG tablet [id number] Yes Take 1 tablet (20 mg total) by mouth daily. Antonetta Rollene BRAVO, MD  Active Child  benzonatate  (TESSALON ) 100 MG capsule [id number] Yes Take 1 capsule (100 mg total) by mouth 3 (three) times daily as needed for cough. Martin, William C, PA-C  Active   BIOTIN PO [id number]  Take 1 tablet by mouth daily.  Patient not taking: Reported on 06/22/2024   [provider]  Active Child  bisacodyl  5 MG EC tablet [id number] Yes Take one tablet by mouth every 3 days as needed, for constipation. Repeat once after 6 hours if still no bowel movement Antonetta Rollene BRAVO, MD  Active Child  Cholecalciferol (VITAMIN D3) 125 MCG (5000 UT) CAPS [id number] Yes Take 1 capsule (5,000 Units total) by mouth daily. Antonetta Rollene BRAVO, MD  Active Child   cloNIDine  (CATAPRES ) 0.2 MG tablet [id number] Yes Take 0.2 mg by mouth daily. [provider]  Active Self  clotrimazole -betamethasone  (LOTRISONE ) cream [id number]  Apply cream twice daily to rash in groin for 10 days, then as needed  Patient not taking: Reported on 06/22/2024   Antonetta Rollene BRAVO, MD  Active Child  docusate sodium  (COLACE) 100 MG capsule [id number] Yes Take 1 capsule (100 mg total) by mouth 2 (two) times daily. Pappayliou, Dorothyann LABOR, DO  Active Child  famotidine (PEPCID) 10 MG tablet [id number] Yes Take 10 mg by mouth 2 (two) times daily. [provider]  Active Child  fluticasone  (FLONASE ) 50 MCG/ACT nasal spray [id number] Yes Place 2 sprays into both nostrils daily. Gladis Elsie BROCKS, PA-C  Active   furosemide  (LASIX ) 40 MG tablet [id number] Yes Take 40 mg by mouth daily as needed. [provider]  Active Child  gabapentin  (NEURONTIN ) 100 MG capsule [id number] Yes TAKE ONE CAPSULE BY MOUTH AT BEDTIME  Patient taking differently: Take 100 mg by mouth 2 (two) times daily.   Antonetta Rollene BRAVO, MD  Active   Hydrocortisone (GERHARDT'S BUTT CREAM) CREA [id number]  Apply 1 application topically 3 (three) times daily.  Patient not taking: Reported on 06/22/2024   Moishe Chiquita HERO, NP  Active Child  losartan -hydrochlorothiazide  [facility]) 100-25 MG tablet [id number] Yes Take 1 tablet by mouth daily. [provider]  Active Child  magnesium  30 MG tablet [id number] Yes Take one tablet by mouth once daily Antonetta Rollene BRAVO, MD  Active Child  megestrol  (MEGACE ) 40 MG/ML suspension [id number] Yes  Take 10 mLs (400 mg total) by mouth 2 (two) times daily. Rogers Hai, MD  Active Child  Melatonin 3 MG CAPS [id number] Yes Take 1 capsule (3 mg total) by mouth at bedtime. Antonetta Rollene BRAVO, MD  Active Child  metoprolol  succinate (TOPROL -XL) 50 MG 24 hr tablet [id number] Yes TAKE 1 TABLET BY MOUTH  DAILY WITH OR IMMEDIATELY  FOLLOWING A MEAL Antonetta Rollene BRAVO,  MD  Active Child  olopatadine  (PATANOL) 0.1 % ophthalmic solution [id number] Yes Place 1 drop into both eyes 2 (two) times daily. Antonetta Rollene BRAVO, MD  Active Child  omeprazole  (PRILOSEC) 20 MG capsule [id number] Yes Take 1 capsule (20 mg total) by mouth daily. Antonetta Rollene BRAVO, MD  Active Child  oxyCODONE  (OXY IR/ROXICODONE ) 5 MG immediate release tablet [id number] Yes Take 5 mg by mouth every 4 (four) hours as needed for severe pain (pain score 7-10). [provider]  Active   polyethylene glycol powder (GLYCOLAX /MIRALAX ) 17 GM/SCOOP powder [id number] Yes Take 17 g by mouth 2 (two) times daily as needed. Antonetta Rollene BRAVO, MD  Active Child  potassium chloride  SA (KLOR-CON  M) 20 MEQ tablet [id number] Yes Take 20 mEq by mouth daily. [provider]  Active Child  prochlorperazine  (COMPAZINE ) 10 MG tablet [id number] Yes Take 1 tablet (10 mg total) by mouth every 6 (six) hours as needed for nausea or vomiting. Rogers Hai, MD  Active Child  pyridOXINE  (VITAMIN B-6) 100 MG tablet [id number] Yes Take 200 mg by mouth daily. [provider]  Active Child  sertraline  (ZOLOFT ) 50 MG tablet [id number] Yes Take 1 tablet (50 mg total) by mouth daily. Antonetta Rollene BRAVO, MD  Active   temazepam  (RESTORIL ) 15 MG capsule [id number] Yes Take 1 capsule (15 mg total) by mouth at bedtime as needed for sleep. Tobie Suzzane POUR, MD  Active             Recommendation:   Continue Current Plan of Care  Follow Up Plan:   Closing From:  Complex Care Management. Referral received back in July. Patient currently has private sitter around the clock that assist with her ADLs. No further needs related to ADLs at this time.  Rosina Forte, BSN RN [facility], [facility] RN Care Manager Direct Dial: [phone]  Fax: [phone]

## 2024-06-22 NOTE — Patient Instructions (Signed)
 Visit Information  Thank you for taking time to visit with me today. Please don't hesitate to contact me if I can be of assistance to you before our next scheduled appointment.  Our next appointment is no further scheduled appointments.   Please call the care guide team at [phone] if you need to cancel or reschedule your appointment.   Please call the Suicide and Crisis Lifeline: 988 call the [place]  National Suicide Prevention Lifeline: (506)820-5096 or TTY: 3301240226 TTY 804-744-4625) to talk to a trained counselor call 1-800-273-TALK (toll free, 24 hour hotline) if you are experiencing a Mental Health or Behavioral Health Crisis or need someone to talk to.  Patient verbalized understanding of Care plan and visit instructions communicated this visit  Rosina Forte, BSN RN [facility], [facility] RN Care Manager Direct Dial: [phone]  Fax: [phone]

## 2024-06-23 ENCOUNTER — Telehealth: Payer: Self-pay | Admitting: Family Medicine

## 2024-06-23 ENCOUNTER — Ambulatory Visit: Admitting: Family Medicine

## 2024-06-23 ENCOUNTER — Ambulatory Visit: Payer: Self-pay

## 2024-06-23 NOTE — Telephone Encounter (Signed)
 FYI- this was the last communication from [facility]. Will keep appt tomorrow and if pain gets worse will go to urgent care/ED

## 2024-06-23 NOTE — Telephone Encounter (Signed)
 Noted patient scheduled 12/4

## 2024-06-23 NOTE — Telephone Encounter (Signed)
 FYI Only or Action Required?: Action required by provider: request for appointment.  Patient was last seen in primary care on 06/16/2024 by Gladis Elsie BROCKS, PA-C.  Called Nurse Triage reporting Leg Pain.  Symptoms began today.  Interventions attempted: OTC medications: biofreeze.  Symptoms are: gradually worsening.  Triage Disposition: See HCP Within 4 Hours (Or PCP Triage)  Patient/caregiver understands and will follow disposition?: Yes Reason for Disposition  [1] SEVERE pain (e.g., excruciating, unable to do any normal activities) AND [2] not improved after 2 hours of pain medicine  Answer Assessment - Initial Assessment Questions Lacie from [facility] calling in today for patient today. No swelling, or redness, 2 max assist. Using biofreeze. Patient has appointment tomorrow but wanted to see if she can be squeezed in today as they are trying to keep her out of the hospital. Called [facility] and spoke with Brittany, she stated she is waiting for a response from the provider to see if they can get her in today. Transferred Lacie over to Brittany to finish scheduling.   1. ONSET: When did the pain start?      Going on for some time, seems more extreme today and does not want to get up a walk.   2. LOCATION: Where is the pain located?      Bilateral thigh / groin pain  3. PAIN: How bad is the pain?    (Scale 1-10; or mild, moderate, severe)     9/10  4. WORK OR EXERCISE: Has there been any recent work or exercise that involved this part of the body?      Denies  5. CAUSE: What do you think is causing the leg pain?     Unsure  6. OTHER SYMPTOMS: Do you have any other symptoms? (e.g., chest pain, back pain, breathing difficulty, swelling, rash, fever, numbness, weakness)     Weakness in legs, uses a walker anyways  Protocols used: Leg Pain-A-AH  Copied from CRM #[id number]. Topic: Clinical - Red Word Triage >> Jun 23, 2024  8:57 AM Mercedes MATSU wrote: Red Word that prompted  transfer to Nurse Triage: Patients nurse Lacie Bertram from [facility] called in stating that the patient is having extreme and severe pain in bilateral upper portion of her legs. She is scheduled to be seen in office tomorrow but the nurse is concerned and wants to either speak to a nurse or get seen today.

## 2024-06-23 NOTE — Telephone Encounter (Signed)
 Noted! Thank you

## 2024-06-23 NOTE — Telephone Encounter (Signed)
 Patient son called said his mother must be seen today due to a lot of pain in leg. He insist having her seen today in office, when I contacted the patient son back and explain no openings, she will keep her appointment scheduled for 12/4. He has her pain under control at the moment if gets worse he will take her to the urgent care.

## 2024-06-24 ENCOUNTER — Ambulatory Visit: Admitting: Family Medicine

## 2024-06-24 ENCOUNTER — Emergency Department

## 2024-06-24 ENCOUNTER — Other Ambulatory Visit: Payer: Self-pay

## 2024-06-24 ENCOUNTER — Emergency Department
Admission: EM | Admit: 2024-06-24 | Discharge: 2024-06-24 | Disposition: A | Discharge status: Home / Self Care | Attending: Emergency Medicine | Admitting: Emergency Medicine

## 2024-06-24 ENCOUNTER — Encounter: Payer: Self-pay | Admitting: Emergency Medicine

## 2024-06-24 DIAGNOSIS — M79604 Pain in right leg: Secondary | ICD-10-CM | POA: Insufficient documentation

## 2024-06-24 DIAGNOSIS — M79605 Pain in left leg: Secondary | ICD-10-CM | POA: Insufficient documentation

## 2024-06-24 DIAGNOSIS — J45901 Unspecified asthma with (acute) exacerbation: Secondary | ICD-10-CM | POA: Insufficient documentation

## 2024-06-24 DIAGNOSIS — R1084 Generalized abdominal pain: Secondary | ICD-10-CM | POA: Insufficient documentation

## 2024-06-24 DIAGNOSIS — R051 Acute cough: Secondary | ICD-10-CM | POA: Insufficient documentation

## 2024-06-24 DIAGNOSIS — Z853 Personal history of malignant neoplasm of breast: Secondary | ICD-10-CM | POA: Insufficient documentation

## 2024-06-24 LAB — CBC WITH DIFFERENTIAL/PLATELET
Abs Immature Granulocytes: 0.04 K/uL (ref 0.00–0.07)
Basophils Absolute: 0 K/uL (ref 0.0–0.1)
Basophils Relative: 0 %
Eosinophils Absolute: 0.1 K/uL (ref 0.0–0.5)
Eosinophils Relative: 1 %
HCT: 36 % (ref 36.0–46.0)
Hemoglobin: 11.6 g/dL — ABNORMAL LOW (ref 12.0–15.0)
Immature Granulocytes: 0 %
Lymphocytes Relative: 17 %
Lymphs Abs: 2.1 K/uL (ref 0.7–4.0)
MCH: 30.1 pg (ref 26.0–34.0)
MCHC: 32.2 g/dL (ref 30.0–36.0)
MCV: 93.3 fL (ref 80.0–100.0)
Monocytes Absolute: 1.1 K/uL — ABNORMAL HIGH (ref 0.1–1.0)
Monocytes Relative: 9 %
Neutro Abs: 8.7 K/uL — ABNORMAL HIGH (ref 1.7–7.7)
Neutrophils Relative %: 73 %
Platelets: 214 K/uL (ref 150–400)
RBC: 3.86 MIL/uL — ABNORMAL LOW (ref 3.87–5.11)
RDW: 14.5 % (ref 11.5–15.5)
WBC: 12.2 K/uL — ABNORMAL HIGH (ref 4.0–10.5)
nRBC: 0 % (ref 0.0–0.2)

## 2024-06-24 LAB — URINALYSIS, W/ REFLEX TO CULTURE (INFECTION SUSPECTED)
Bilirubin Urine: NEGATIVE
Glucose, UA: NEGATIVE mg/dL
Hgb urine dipstick: NEGATIVE
Ketones, ur: NEGATIVE mg/dL
Leukocytes,Ua: NEGATIVE
Nitrite: NEGATIVE
Protein, ur: NEGATIVE mg/dL
Specific Gravity, Urine: 1.014 (ref 1.005–1.030)
pH: 5 (ref 5.0–8.0)

## 2024-06-24 LAB — COMPREHENSIVE METABOLIC PANEL WITH GFR
ALT: 10 U/L (ref 0–44)
AST: 21 U/L (ref 15–41)
Albumin: 4 g/dL (ref 3.5–5.0)
Alkaline Phosphatase: 91 U/L (ref 38–126)
Anion gap: 12 (ref 5–15)
BUN: 28 mg/dL — ABNORMAL HIGH (ref 8–23)
CO2: 29 mmol/L (ref 22–32)
Calcium: 10.1 mg/dL (ref 8.9–10.3)
Chloride: 98 mmol/L (ref 98–111)
Creatinine, Ser: 0.97 mg/dL (ref 0.44–1.00)
GFR, Estimated: >60 mL/min (ref >60)
Glucose, Bld: 176 mg/dL — ABNORMAL HIGH (ref 70–99)
Potassium: 3.7 mmol/L (ref 3.5–5.1)
Sodium: 139 mmol/L (ref 135–145)
Total Bilirubin: 0.9 mg/dL (ref 0.0–1.2)
Total Protein: 8 g/dL (ref 6.5–8.1)

## 2024-06-24 LAB — RESP PANEL BY RT-PCR (RSV, FLU A&B, COVID)  RVPGX2
Influenza A by PCR: NEGATIVE
Influenza B by PCR: NEGATIVE
Resp Syncytial Virus by PCR: NEGATIVE
SARS Coronavirus 2 by RT PCR: NEGATIVE

## 2024-06-24 LAB — LIPASE, BLOOD: Lipase: 15 U/L (ref 11–51)

## 2024-06-24 MED ORDER — CHLORHEXIDINE GLUCONATE CLOTH 2 % EX PADS
6.0000 | MEDICATED_PAD | Freq: Every day | CUTANEOUS | Status: DC
  Filled 2024-06-24: qty 6

## 2024-06-24 MED ORDER — HEPARIN NA (PORK) LOCK FLSH PF 10 UNIT/ML IV SOLN
10.0000 [IU] | Freq: Once | INTRAVENOUS | Status: AC
  Administered 2024-06-24: 10 [IU]
  Filled 2024-06-24: qty 5

## 2024-06-24 MED ORDER — OXYCODONE HCL 5 MG PO TABS: 2.5000 mg | ORAL_TABLET | Freq: Three times a day (TID) | ORAL | 0 refills | Status: AC | PRN

## 2024-06-24 MED ORDER — MORPHINE SULFATE (PF) 2 MG/ML IV SOLN
2.0000 mg | Freq: Once | INTRAVENOUS | Status: AC
  Administered 2024-06-24: 2 mg via INTRAVENOUS
  Filled 2024-06-24: qty 1

## 2024-06-24 MED ORDER — IPRATROPIUM-ALBUTEROL 0.5-2.5 (3) MG/3ML IN SOLN
3.0000 mL | Freq: Once | RESPIRATORY_TRACT | Status: AC
  Administered 2024-06-24: 3 mL via RESPIRATORY_TRACT
  Filled 2024-06-24: qty 3

## 2024-06-24 MED ORDER — SODIUM CHLORIDE 0.9% FLUSH: 10.0000 mL | Freq: Two times a day (BID) | INTRAVENOUS | Status: DC

## 2024-06-24 MED ORDER — PREDNISONE 20 MG PO TABS: 40.0000 mg | ORAL_TABLET | Freq: Every day | ORAL | 0 refills | Status: AC

## 2024-06-24 MED ORDER — METHYLPREDNISOLONE SODIUM SUCC 125 MG IJ SOLR
125.0000 mg | Freq: Once | INTRAMUSCULAR | Status: AC
  Administered 2024-06-24: 125 mg via INTRAVENOUS
  Filled 2024-06-24: qty 2

## 2024-06-24 MED ORDER — SODIUM CHLORIDE 0.9 % IV BOLUS
500.0000 mL | Freq: Once | INTRAVENOUS | Status: AC
  Administered 2024-06-24: 500 mL via INTRAVENOUS

## 2024-06-24 MED ORDER — SODIUM CHLORIDE 0.9% FLUSH: 10.0000 mL | INTRAVENOUS | Status: DC | PRN

## 2024-06-24 MED ORDER — IOHEXOL 350 MG/ML SOLN
100.0000 mL | Freq: Once | INTRAVENOUS | Status: AC | PRN
  Administered 2024-06-24: 100 mL via INTRAVENOUS

## 2024-06-24 NOTE — ED Notes (Addendum)
 Pt updated on status. CT advised x2 pt's in front of her.

## 2024-06-24 NOTE — Discharge Instructions (Addendum)
 You were seen in the ER today for evaluation of your cough, weakness, leg pain.  I suspect you have a viral illness that is flaring your asthma.  In the setting of illness I think this may be worsening your chronic leg pain.  I sent a short course of pain medicine to your pharmacy to help with your symptoms.  Continue to follow-up with your outpatient team for further evaluation.  Return to the ER for new or worsening symptoms.

## 2024-06-24 NOTE — ED Provider Notes (Signed)
 [facility] Provider Note    Event Date/Time   First MD Initiated Contact with Patient 06/24/24 0935     (approximate)   History   Leg Pain and Weakness   HPI  [name] is a 78 year old female with history of breast cancer presenting to the emergency department for evaluation of weakness.  Has had cough and congestion for the last week.  Been using medicine from her primary care team without significant benefit.  Additionally reports abdominal pain, but is not sure if this is just from coughing.  Has had ongoing issues with leg pain bilaterally, feels like these have worsened recently.  No leg swelling.  Has had limited mobility in the setting of pain recently.     Physical Exam   Triage Vital Signs: ED Triage Vitals  Encounter Vitals Group     BP 06/24/24 0927 132/70     Girls Systolic BP Percentile --      Girls Diastolic BP Percentile --      Boys Systolic BP Percentile --      Boys Diastolic BP Percentile --      Pulse Rate 06/24/24 0927 88     Resp 06/24/24 0927 18     Temp 06/24/24 0927 98.2 F (36.8 C)     Temp Source 06/24/24 0927 Oral     SpO2 06/24/24 0927 99 %     Weight 06/24/24 0923 175 lb (79.4 kg)     Height 06/24/24 0923 5' 1 (1.549 m)     Head Circumference --      Peak Flow --      Pain Score 06/24/24 0923 10     Pain Loc --      Pain Education --      Exclude from Growth Chart --     Most recent vital signs: Vitals:   06/24/24 0942 06/24/24 1340  BP:  (!) 143/77  Pulse:  89  Resp:  20  Temp:  98.4 F (36.9 C)  SpO2: 100% 100%     General: Awake, interactive  CV:  Good peripheral perfusion Resp:  Unlabored respirations, mildly diminished breath sounds with rare wheeze Abd:  Nondistended, soft, mild tenderness to palpation neuro:  Symmetric facial movement, fluid speech Other:   No swelling of the bilateral lower extremities.  2+ DP pulses bilaterally.   ED Results / Procedures / Treatments    Labs (all labs ordered are listed, but only abnormal results are displayed) Labs Reviewed  CBC WITH DIFFERENTIAL/PLATELET - Abnormal; Notable for the following components:      Result Value   WBC 12.2 (*)    RBC 3.86 (*)    Hemoglobin 11.6 (*)    Neutro Abs 8.7 (*)    Monocytes Absolute 1.1 (*)    All other components within normal limits  COMPREHENSIVE METABOLIC PANEL WITH GFR - Abnormal; Notable for the following components:   Glucose, Bld 176 (*)    BUN 28 (*)    All other components within normal limits  URINALYSIS, W/ REFLEX TO CULTURE (INFECTION SUSPECTED) - Abnormal; Notable for the following components:   Color, Urine YELLOW (*)    APPearance CLEAR (*)    Bacteria, UA MANY (*)    All other components within normal limits  RESP PANEL BY RT-PCR (RSV, FLU A&B, COVID)  RVPGX2  URINE CULTURE  LIPASE, BLOOD     EKG EKG independently reviewed and interpreted by myself demonstrates:  EKG demonstrates sinus rhythm  at a rate of 86, PR 155, QRS 102, QTc 469, no acute ST changes  RADIOLOGY Imaging independently reviewed and interpreted by myself demonstrates:  6 are without focal consolidation CT of the chest without PE, does demonstrate cardiomegaly CT abdomen pelvis without acute abnormality  Formal Radiology Read:  CT Angio Chest PE W and/or Wo Contrast Result Date: 06/24/2024 CLINICAL DATA:  Bilateral leg and diffuse abdominal pain. Incompletely treated breast cancer. EXAM: CT ANGIOGRAPHY CHEST CT ABDOMEN AND PELVIS WITH CONTRAST TECHNIQUE: Multidetector CT imaging of the chest was performed using the standard protocol during bolus administration of intravenous contrast. Multiplanar CT image reconstructions and MIPs were obtained to evaluate the vascular anatomy. Multidetector CT imaging of the abdomen and pelvis was performed using the standard protocol during bolus administration of intravenous contrast. RADIATION DOSE REDUCTION: This exam was performed according to the  departmental dose-optimization program which includes automated exposure control, adjustment of the mA and/or kV according to patient size and/or use of iterative reconstruction technique. CONTRAST:  OMNIPAQUE  IOHEXOL  350 MG/ML SOLN COMPARISON:  Chest radiographs dated 06/24/2024 and 02/25/2024. Abdomen and pelvis CT dated 02/23/2024. FINDINGS: CTA CHEST FINDINGS Cardiovascular: Normally opacified pulmonary arteries with no pulmonary arterial filling defects seen. Atheromatous arterial calcifications, including the thoracic aorta. Mitral valve annulus calcifications. Mildly enlarged heart. No pericardial effusion. Mildly enlarged central pulmonary arteries with a main pulmonary artery diameter of 3.4 cm. Mediastinum/Nodes: No enlarged mediastinal, hilar, or axillary lymph nodes. Thyroid  gland, trachea, and esophagus demonstrate no significant findings. Lungs/Pleura: Prominent pulmonary vasculature. Minimal right lower lobe dependent atelectasis. Minimally prominent interstitial markings with a ground-glass pattern. No pleural fluid. Musculoskeletal: 4 mm normal-appearing perifissural lymph node along the minor fissure on image number 46/5. Post lumpectomy changes in the medial right breast. Review of the MIP images confirms the above findings. 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 No liver metastases. Normal-appearing gallbladder. Pancreas: Unremarkable. No pancreatic ductal dilatation or surrounding inflammatory changes. Spleen: Normal in size without focal abnormality. Adrenals/Urinary Tract: Adrenal glands are unremarkable. Kidneys are normal, without renal calculi, focal lesion, or hydronephrosis. Bladder is unremarkable. Stomach/Bowel: Distal sigmoid colon diverticulosis without evidence of diverticulitis. 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 Unremarkable stomach and small bowel. The appendix is not visualized. No secondary signs of appendicitis. Vascular/Lymphatic: Atheromatous arterial calcifications without aneurysm. No enlarged lymph nodes. Reproductive: Status post hysterectomy. No adnexal masses. Other: Midline surgical scar. Musculoskeletal: Left sacroiliitis. Mild mild-to-moderate lumbar and lower thoracic spine degenerative changes. No evidence of bony metastatic disease. Review of the MIP images confirms the above findings. IMPRESSION: 1. No pulmonary emboli. 2. Mild cardiomegaly and mild changes of congestive heart failure. 3. Mildly enlarged central pulmonary arteries, suggesting pulmonary arterial hypertension. 4. No acute abnormality in the abdomen or pelvis. 5. Distal sigmoid colon diverticulosis. 6. Left sacroiliitis. 7. Aortic atherosclerosis. Aortic Atherosclerosis (ICD10-I70.0). Electronically Signed   By: Elspeth Bathe M.D.   On: 06/24/2024 14:21   CT ABDOMEN PELVIS W CONTRAST Result Date: 06/24/2024 CLINICAL DATA:  Bilateral leg and diffuse abdominal pain. Incompletely treated breast cancer. EXAM: CT ANGIOGRAPHY CHEST CT ABDOMEN AND PELVIS WITH CONTRAST TECHNIQUE: Multidetector CT imaging of the chest was performed using the standard protocol during bolus administration of intravenous contrast. Multiplanar CT image reconstructions and MIPs were obtained to evaluate the vascular anatomy. Multidetector CT imaging of the abdomen  and pelvis was performed using the standard protocol during bolus administration of intravenous contrast. RADIATION DOSE REDUCTION: This exam was performed according to the departmental dose-optimization program which includes automated exposure control, adjustment of the mA and/or kV according to patient size and/or use of iterative reconstruction technique. CONTRAST:  OMNIPAQUE  IOHEXOL  350 MG/ML SOLN COMPARISON:  Chest radiographs dated 06/24/2024 and 02/25/2024. Abdomen and  pelvis CT dated 02/23/2024. FINDINGS: CTA CHEST FINDINGS Cardiovascular: Normally opacified pulmonary arteries with no pulmonary arterial filling defects seen. Atheromatous arterial calcifications, including the thoracic aorta. Mitral valve annulus calcifications. Mildly enlarged heart. No pericardial effusion. Mildly enlarged central pulmonary arteries with a main pulmonary artery diameter of 3.4 cm. Mediastinum/Nodes: No enlarged mediastinal, hilar, or axillary lymph nodes. Thyroid  gland, trachea, and esophagus demonstrate no significant findings. Lungs/Pleura: Prominent pulmonary vasculature. Minimal right lower lobe dependent atelectasis. Minimally prominent interstitial markings with a ground-glass pattern. No pleural fluid. Musculoskeletal: 4 mm normal-appearing perifissural lymph node along the minor fissure on image number 46/5. Post lumpectomy changes in the medial right breast. Review of the MIP images confirms the above findings. 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 No liver metastases. Normal-appearing gallbladder. Pancreas: Unremarkable. No pancreatic ductal dilatation or surrounding inflammatory changes. Spleen: Normal in size without focal abnormality. Adrenals/Urinary Tract: Adrenal glands are unremarkable. Kidneys are normal, without renal calculi, focal lesion, or hydronephrosis. Bladder is unremarkable. Stomach/Bowel: Distal sigmoid colon diverticulosis without evidence of diverticulitis. Surgical absence of the remainder of the sigmoid colon with a colostomy in place. Unremarkable stomach and small bowel. The appendix is not visualized. No secondary signs of appendicitis. Vascular/Lymphatic: Atheromatous arterial calcifications without aneurysm. No enlarged lymph nodes. Reproductive: Status post hysterectomy. No adnexal masses. Other: Midline surgical  scar. Musculoskeletal: Left sacroiliitis. Mild mild-to-moderate lumbar and lower thoracic spine degenerative changes. No evidence of bony metastatic disease. Review of the MIP images confirms the above findings. IMPRESSION: 1. No pulmonary emboli. 2. Mild cardiomegaly and mild changes of congestive heart failure. 3. Mildly enlarged central pulmonary arteries, suggesting pulmonary arterial hypertension. 4. No acute abnormality in the abdomen or pelvis. 5. Distal sigmoid colon diverticulosis. 6. Left sacroiliitis. 7. Aortic atherosclerosis. Aortic Atherosclerosis (ICD10-I70.0). Electronically Signed   By: Elspeth Bathe M.D.   On: 06/24/2024 14:21   DG Chest Portable 1 View Result Date: 06/24/2024 CLINICAL DATA:  Cough. EXAM: PORTABLE CHEST 1 VIEW COMPARISON:  02/25/2024 FINDINGS: Patient rotated to the left. Left IJ central venous catheter unchanged. Lungs are hypoinflated without focal airspace consolidation or effusion. Stable mild prominence of the right hilar vasculature. Stable elevation of the right hemidiaphragm. Mild stable cardiomegaly. Remainder of the exam is unchanged. IMPRESSION: 1. Hypoinflation possible mild vascular congestion. 2. Stable cardiomegaly. Electronically Signed   By: Toribio Agreste M.D.   On: 06/24/2024 10:27    PROCEDURES:  Critical Care performed: No  Procedures   MEDICATIONS ORDERED IN ED: Medications  sodium chloride  flush (NS) 0.9 % injection 10-40 mL (has no administration in time range)  sodium chloride  flush (NS) 0.9 % injection 10-40 mL (has no administration in time range)  Chlorhexidine  Gluconate Cloth 2 % PADS 6 each (has no administration in time range)  sodium chloride  0.9 % bolus 500 mL (0 mLs Intravenous Stopped 06/24/24 1203)  ipratropium-albuterol  (DUONEB) 0.5-2.5 (3) MG/3ML nebulizer solution 3 mL (3 mLs Nebulization Given 06/24/24 1020)  methylPREDNISolone  sodium succinate (SOLU-MEDROL ) 125 mg/2 mL injection  125 mg (125 mg Intravenous Given 06/24/24 1016)   morphine  (PF) 2 MG/ML injection 2 mg (2 mg Intravenous Given 06/24/24 1017)  iohexol  (OMNIPAQUE ) 350 MG/ML injection 100 mL (100 mLs Intravenous Contrast Given 06/24/24 1258)  morphine  (PF) 2 MG/ML injection 2 mg (2 mg Intravenous Given 06/24/24 1524)  ipratropium-albuterol  (DUONEB) 0.5-2.5 (3) MG/3ML nebulizer solution 3 mL (3 mLs Nebulization Given 06/24/24 1524)     IMPRESSION / MDM / ASSESSMENT AND PLAN / ED COURSE  I reviewed the triage vital signs and the nursing notes.  Differential diagnosis includes, but is not limited to, illness, pneumonia, anemia, electrolyte abnormality, UTI, PE, intra-abdominal process, low suspicion DVT given report of leg pain has been ongoing since October, no swelling or recent trauma.  Patient's presentation is most consistent with acute presentation with potential threat to life or bodily function.  78 year old female presenting to the emergency department for evaluation of weakness  In the setting of recent respiratory symptoms.  Stable vitals on presentation.  Labs with mild leukocytosis, stable anemia, CMP without critical derangements.  Normal lipase.  Viral swab negative.  Equivocal urine, culture sent.  Patient without urinary symptoms, will defer on antibiotics for now.  CTA of the chest and CT abdomen pelvis without acute findings. EKG without acute findings.  Patient reassessed.  Does feel improved after receiving pain medication here.  Family is comfortable with discharge home.  They report that she has been using as needed oxycodone  at home with some benefit from prior prescription that I am able to see in the PMD P database.  Will send short course of additional pain medication, but did discuss that given her chronic leg pain this is better managed by her outpatient team.  Regarding her upper respiratory symptoms, low suspicion pneumonia given reassuring CT.  Do suspect she may have a mild asthma flare.  Will DC with short course of steroids.  Family  does understand risks of opioids including respiratory depression, lethargy.  Strict return precautions provided.  Patient discharged in stable condition.      FINAL CLINICAL IMPRESSION(S) / ED DIAGNOSES   Final diagnoses:  Acute cough  Generalized abdominal pain  Pain in both lower extremities  Exacerbation of asthma, unspecified asthma severity, unspecified whether persistent     Rx / DC Orders   ED Discharge Orders          Ordered    predniSONE  (DELTASONE ) 20 MG tablet  Daily with breakfast        06/24/24 1537    oxyCODONE  (ROXICODONE ) 5 MG immediate release tablet  Every 8 hours PRN        06/24/24 1538             Note:  This document was prepared using Dragon voice recognition software and may include unintentional dictation errors.   Levander Slate, MD 06/24/24 [phone]

## 2024-06-24 NOTE — ED Triage Notes (Signed)
 Pt via POV from home. Pt c/o bilateral leg pain that starts in her knee and radiates up to her thigh. Also reports L foot pain. States family has been trying to get her seen with her PCP but unable to get an appt. Reports since yesterday she has been unable to walk since yesterday. Also report hx of URI and has been of medications for a week that has not been help.   Pt has a hx of cancer and not actively on chemo but states she has a consultation for radiation next week. Pt is A&Ox4 and NAD.

## 2024-06-24 NOTE — ED Notes (Signed)
CT notified of pt's port access.

## 2024-06-24 NOTE — ED Notes (Signed)
 PT to CT at this time.

## 2024-06-24 NOTE — ED Notes (Signed)
 Port de-accessed

## 2024-06-26 LAB — URINE CULTURE: Culture: >=100000 — AB

## 2024-06-27 NOTE — Consult Note (Signed)
 Pt grew Klebsiella pneumoniae in urine culture. No antibiotics were prescribed due to no urinary symptoms. Patient was called on 12/7 and she states she is not having any symptoms still and showed improvement. Case discussed with Dr. Dicky, and no antibiotics are needed at this time.    Cathaleen Blanch, PharmD, BCPS

## 2024-06-29 ENCOUNTER — Other Ambulatory Visit: Payer: Self-pay | Admitting: Family Medicine

## 2024-07-01 ENCOUNTER — Ambulatory Visit: Admitting: Radiation Oncology

## 2024-07-01 ENCOUNTER — Encounter: Payer: Self-pay | Admitting: Oncology

## 2024-07-01 NOTE — Telephone Encounter (Signed)
 We can start him on gabapentin  300 mg at bedtime and see if that improves his symptoms. Can still take oxy and tylenol  as needed.    Delon Hope, AGNP-C Department of Hematology/Oncology [facility]  Phone: [phone]  07/01/2024 5:36 PM

## 2024-07-05 ENCOUNTER — Ambulatory Visit: Admitting: Internal Medicine

## 2024-07-05 ENCOUNTER — Encounter: Payer: Self-pay | Admitting: Internal Medicine

## 2024-07-05 VITALS — BP 151/77 | HR 84 | Ht 60.0 in | Wt 178.0 lb

## 2024-07-05 DIAGNOSIS — Z09 Encounter for follow-up examination after completed treatment for conditions other than malignant neoplasm: Secondary | ICD-10-CM

## 2024-07-05 DIAGNOSIS — G6289 Other specified polyneuropathies: Secondary | ICD-10-CM | POA: Diagnosis not present

## 2024-07-05 DIAGNOSIS — N3000 Acute cystitis without hematuria: Secondary | ICD-10-CM

## 2024-07-05 DIAGNOSIS — L304 Erythema intertrigo: Secondary | ICD-10-CM

## 2024-07-05 MED ORDER — CLOTRIMAZOLE-BETAMETHASONE 1-0.05 % EX CREA: TOPICAL_CREAM | CUTANEOUS | 1 refills | Status: AC

## 2024-07-05 MED ORDER — DULOXETINE HCL 30 MG PO CPEP: 30.0000 mg | ORAL_CAPSULE | Freq: Every day | ORAL | 3 refills | Status: AC

## 2024-07-05 MED ORDER — CEFDINIR 300 MG PO CAPS: 300.0000 mg | ORAL_CAPSULE | Freq: Two times a day (BID) | ORAL | 0 refills | Status: DC

## 2024-07-05 NOTE — Patient Instructions (Signed)
 Please take Cymbalta  30 mg once daily for neuropathy symptoms.  Please take Cefdinir  for UTI. Please apply Lotrisone  cream for groin area pain/burning.  Please take Mucinex as needed for cough.

## 2024-07-05 NOTE — Progress Notes (Unsigned)
 Acute Office Visit  Subjective:    Patient ID: [name], female    DOB: 08/11/1945, 78 y.o.   MRN: [id number]  Chief Complaint  Patient presents with   Follow-up    ER follow up    HPI Patient is in today for follow-up of recent ER visit.  She went to ER for complaint of weakness.  She also was having cough and chest congestion for 1 week prior to the visit.  She also reported abdominal pain, but only with coughing.  She also complained of bilateral leg pain, which was limiting her mobility.  In the ER, she had CT chest PE protocol, which was negative for PE.  She also had CT abdomen pelvis with contrast, which did not show any acute abnormality.  She was given oral prednisone  for acute cough, considering possibility of acute bronchitis.  She was also given oxycodone  for severe leg pain.  Today, she reports persistent fatigue since the ER visit.  Of note, her urine culture was positive for UTI, but has not been treated yet.  She herself denies dysuria currently, but does report perineal area itching and redness.  She reports pain in the groin area, which limits her mobility.  Denies any episode of diarrhea, melena or hematochezia recently.  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   Cancer (HCC)    Depression    Diverticulosis of colon    GERD (gastroesophageal reflux disease)    Hyperlipidemia    Hypertension    Obesity    Osteoarthritis of knees, bilateral    Sleep apnea     Past Surgical History:  Procedure Laterality Date   ABDOMINAL HYSTERECTOMY     AXILLARY SENTINEL NODE BIOPSY Right 10/22/2023   Procedure: BIOPSY, LYMPH NODE, SENTINEL, AXILLARY;  Surgeon: Evonnie Dorothyann LABOR, DO;  Location: [facility];  Service: General;  Laterality: Right;   BREAST BIOPSY Right 09/09/2023   US  RT BREAST BX W LOC DEV 1ST LESION IMG BX SPEC US  GUIDE 09/09/2023 Lennon Nest, MD [facility]   BREAST LUMPECTOMY WITH  RADIO FREQUENCY LOCALIZER Right 10/22/2023   Procedure: BREAST LUMPECTOMY WITH RADIO FREQUENCY LOCALIZER;  Surgeon: Evonnie Dorothyann LABOR, DO;  Location: [facility];  Service: General;  Laterality: Right;   COLECTOMY WITH COLOSTOMY CREATION/HARTMANN PROCEDURE N/A 02/24/2024   Procedure: COLECTOMY, WITH COLOSTOMY CREATION;  Surgeon: Jordis Laneta FALCON, MD;  Location: [facility];  Service: General;  Laterality: N/A;   COLONOSCOPY  07/23/2003   Dr. Keven Smith:numerous large scattered diverticula   COLONOSCOPY N/A 11/15/2013   Dr. Harvey: moderate diverticula, small internal hemorrhoids, redudant colon. Next TCS in 2025 with overtube.    ESOPHAGOGASTRODUODENOSCOPY (EGD) WITH ESOPHAGEAL DILATION N/A 11/15/2013   Dr. Harvey: stricture at GE junction s/p dilation. moderate erosive gastritis, negative H.pylori   MULTIPLE TOOTH EXTRACTIONS  2024   PORTACATH PLACEMENT Left 10/22/2023   Procedure: INSERTION, TUNNELED CENTRAL VENOUS DEVICE, WITH PORT;  Surgeon: Evonnie Dorothyann LABOR, DO;  Location: [facility];  Service: General;  Laterality: Left;   TOTAL KNEE ARTHROPLASTY  06/01/2004   left / Dr. Margrette   TOTAL KNEE ARTHROPLASTY  11/29/2003   right / Dr. Margrette   TUBAL LIGATION      Family History  Problem Relation Age of Onset   Diabetes Mother    Hypertension Mother    Heart disease Mother    Aneurysm Father    Diabetes Father  Hypertension Brother    Diabetes Brother    Diabetes Brother    Diabetes Brother    Hypertension Brother    Breast cancer Maternal Aunt        dx >50   Cancer Maternal Aunt        unknown type; ? breast; dx >50?   Prostate cancer Maternal Uncle    Hypertension Son     Social History   Socioeconomic History   Marital status: Widowed    Spouse name: Debroh Sieloff   Number of children: 2   Years of education: 12   Highest education level: 12th grade  Occupational History   Occupation: retired     Associate Professor: UNEMPLOYED  Tobacco Use   Smoking status: Never     Passive exposure: Never   Smokeless tobacco: Never  Vaping Use   Vaping status: Never Used  Substance and Sexual Activity   Alcohol use: No   Drug use: No   Sexual activity: Not Currently    Partners: Male    Birth control/protection: Surgical  Other Topics Concern   Not on file  Social History Narrative   Not on file   Social Drivers of Health   Tobacco Use: Low Risk (07/06/2024)   Patient History    Smoking Tobacco Use: Never    Smokeless Tobacco Use: Never    Passive Exposure: Never  Financial Resource Strain: Low Risk (05/25/2024)   Overall Financial Resource Strain (CARDIA)    Difficulty of Paying Living Expenses: Not hard at all  Food Insecurity: No Food Insecurity (06/22/2024)   Epic    Worried About Programme Researcher, Broadcasting/film/video in the Last Year: Never true    Ran Out of Food in the Last Year: Never true  Recent Concern: Food Insecurity - Food Insecurity Present (05/25/2024)   Epic    Worried About Programme Researcher, Broadcasting/film/video in the Last Year: Never true    Ran Out of Food in the Last Year: Sometimes true  Transportation Needs: No Transportation Needs (06/22/2024)   Epic    Lack of Transportation (Medical): No    Lack of Transportation (Non-Medical): No  Physical Activity: Insufficiently Active (05/25/2024)   Exercise Vital Sign    Days of Exercise per Week: 7 days    Minutes of Exercise per Session: 20 min  Stress: Stress Concern Present (05/25/2024)   [employer] of Occupational Health - Occupational Stress Questionnaire    Feeling of Stress: Very much  Social Connections: Socially Isolated (05/25/2024)   Social Connection and Isolation Panel    Frequency of Communication with Friends and Family: More than three times a week    Frequency of Social Gatherings with Friends and Family: More than three times a week    Attends Religious Services: Never    Database Administrator or Organizations: No    Attends Banker Meetings: Not on file    Marital Status: Widowed   Intimate Partner Violence: Not At Risk (06/22/2024)   Epic    Fear of Current or Ex-Partner: No    Emotionally Abused: No    Physically Abused: No    Sexually Abused: No  Depression (PHQ2-9): Low Risk (07/06/2024)   Depression (PHQ2-9)    PHQ-2 Score: 0  Alcohol Screen: Low Risk (05/25/2024)   Alcohol Screen    Last Alcohol Screening Score (AUDIT): 0  Housing: Low Risk (06/22/2024)   Epic    Unable to Pay for Housing in the Last Year: No  Number of Times Moved in the Last Year: 0    Homeless in the Last Year: No  Utilities: Not At Risk (06/22/2024)   Epic    Threatened with loss of utilities: No  Health Literacy: Adequate Health Literacy (08/12/2023)   B1300 Health Literacy    Frequency of need for help with medical instructions: Never    Outpatient Medications Prior to Visit  Medication Sig Dispense Refill   albuterol  (VENTOLIN  HFA) 108 (90 Base) MCG/ACT inhaler Inhale 2 puffs into the lungs every 6 (six) hours as needed for wheezing or shortness of breath. 8.5 g 1   amLODipine  (NORVASC ) 10 MG tablet Take 10 mg by mouth daily.     aspirin EC 81 MG tablet Take 81 mg by mouth daily. Swallow whole.     atorvastatin  (LIPITOR) 20 MG tablet Take 1 tablet (20 mg total) by mouth daily. 90 tablet 3   benzonatate  (TESSALON ) 100 MG capsule Take 1 capsule (100 mg total) by mouth 3 (three) times daily as needed for cough. 30 capsule 0   BIOTIN PO Take 1 tablet by mouth daily. (Patient not taking: Reported on 06/22/2024)     bisacodyl  5 MG EC tablet Take one tablet by mouth every 3 days as needed, for constipation. Repeat once after 6 hours if still no bowel movement 30 tablet 0   Cholecalciferol (VITAMIN D3) 125 MCG (5000 UT) CAPS Take 1 capsule (5,000 Units total) by mouth daily. 30 capsule 11   cloNIDine  (CATAPRES ) 0.2 MG tablet Take 0.2 mg by mouth daily.     docusate sodium  (COLACE) 100 MG capsule Take 1 capsule (100 mg total) by mouth 2 (two) times daily. 60 capsule 2   famotidine  (PEPCID) 10 MG tablet Take 10 mg by mouth 2 (two) times daily.     fluticasone  (FLONASE ) 50 MCG/ACT nasal spray Place 2 sprays into both nostrils daily. 16 g 0   furosemide  (LASIX ) 40 MG tablet Take 40 mg by mouth daily as needed.     gabapentin  (NEURONTIN ) 100 MG capsule TAKE ONE CAPSULE BY MOUTH AT BEDTIME (Patient taking differently: Take 100 mg by mouth 2 (two) times daily.) 90 capsule 1   Hydrocortisone (GERHARDT'S BUTT CREAM) CREA Apply 1 application topically 3 (three) times daily. (Patient not taking: Reported on 06/22/2024) 1 each 1   losartan -hydrochlorothiazide  (HYZAAR) 100-25 MG tablet Take 1 tablet by mouth daily.     magnesium  30 MG tablet Take one tablet by mouth once daily 30 tablet 2   megestrol  (MEGACE ) 40 MG/ML suspension Take 10 mLs (400 mg total) by mouth 2 (two) times daily. 480 mL 0   Melatonin 3 MG CAPS Take 1 capsule (3 mg total) by mouth at bedtime. 30 capsule 3   metoprolol  succinate (TOPROL -XL) 50 MG 24 hr tablet TAKE 1 TABLET BY MOUTH  DAILY WITH OR IMMEDIATELY  FOLLOWING A MEAL 100 tablet 2   olopatadine  (PATANOL) 0.1 % ophthalmic solution Place 1 drop into both eyes 2 (two) times daily. 5 mL 6   omeprazole  (PRILOSEC) 20 MG capsule Take 1 capsule (20 mg total) by mouth daily. 100 capsule 2   oxyCODONE  (OXY IR/ROXICODONE ) 5 MG immediate release tablet Take 5 mg by mouth every 4 (four) hours as needed for severe pain (pain score 7-10).     polyethylene glycol powder (GLYCOLAX /MIRALAX ) 17 GM/SCOOP powder Take 17 g by mouth 2 (two) times daily as needed. 3350 g 3   potassium chloride  SA (KLOR-CON  M) 20 MEQ tablet Take  20 mEq by mouth daily.     prochlorperazine  (COMPAZINE ) 10 MG tablet Take 1 tablet (10 mg total) by mouth every 6 (six) hours as needed for nausea or vomiting. 60 tablet 3   pyridOXINE  (VITAMIN B-6) 100 MG tablet Take 200 mg by mouth daily.     sertraline  (ZOLOFT ) 25 MG tablet TAKE 1 TABLET BY MOUTH DAILY 90 tablet 3   sertraline  (ZOLOFT ) 50 MG tablet Take 1  tablet (50 mg total) by mouth daily. 30 tablet 3   temazepam  (RESTORIL ) 15 MG capsule Take 1 capsule (15 mg total) by mouth at bedtime as needed for sleep. 30 capsule 0   clotrimazole -betamethasone  (LOTRISONE ) cream Apply cream twice daily to rash in groin for 10 days, then as needed (Patient not taking: Reported on 06/22/2024) 45 g 1   No facility-administered medications prior to visit.    Allergies[1]  Review of Systems  Constitutional:  Positive for fatigue. Negative for chills and fever.  HENT:  Negative for congestion, sinus pressure, sinus pain and sore throat.   Eyes:  Negative for pain and discharge.  Respiratory:  Negative for cough and shortness of breath.   Cardiovascular:  Negative for chest pain and palpitations.  Gastrointestinal:  Negative for abdominal pain, diarrhea, nausea and vomiting.  Endocrine: Negative for polydipsia and polyuria.  Genitourinary:  Negative for dysuria and hematuria.  Musculoskeletal:  Positive for arthralgias and gait problem. Negative for neck pain and neck stiffness.       Bilateral groin pain  Skin:  Negative for rash.  Neurological:  Positive for weakness. Negative for dizziness.  Psychiatric/Behavioral:  Negative for agitation and behavioral problems.        Objective:    Physical Exam Vitals reviewed.  Constitutional:      General: She is not in acute distress.    Appearance: She is obese. She is not diaphoretic.  HENT:     Head: Normocephalic and atraumatic.     Nose: Nose normal.     Mouth/Throat:     Mouth: Mucous membranes are moist.  Eyes:     General: No scleral icterus.    Extraocular Movements: Extraocular movements intact.  Cardiovascular:     Rate and Rhythm: Normal rate and regular rhythm.     Heart sounds: Normal heart sounds. No murmur heard. Pulmonary:     Breath sounds: Normal breath sounds. No wheezing or rales.  Abdominal:     Palpations: Abdomen is soft.     Tenderness: There is abdominal tenderness (Mild,  suprapubic).  Genitourinary:    Comments: No groin mass noted Musculoskeletal:     Cervical back: Neck supple. No tenderness.     Right lower leg: No edema.     Left lower leg: No edema.  Skin:    General: Skin is warm.     Findings: Erythema (bilateral groin area) present.  Neurological:     General: No focal deficit present.     Mental Status: She is alert and oriented to person, place, and time.  Psychiatric:        Mood and Affect: Mood normal.        Behavior: Behavior normal.     BP (!) 151/77   Pulse 84   Ht 5' (1.524 m)   Wt 178 lb (80.7 kg)   SpO2 96%   BMI 34.76 kg/m  Wt Readings from Last 3 Encounters:  07/05/24 178 lb (80.7 kg)  06/24/24 175 lb (79.4 kg)  06/07/24 176 lb (79.8  kg)        Assessment & Plan:   Problem List Items Addressed This Visit       Nervous and Auditory   Peripheral neuropathy   Has bilateral hand numbness and tingling, likely due to carpal tunnel syndrome Bilateral groin area pain is less likely due to neuropathy She prefers to avoid gabapentin  for now that was prescribed from urgent care Started Cymbalta  30 mg QD, which may be helpful for GAD as well      Relevant Medications   DULoxetine  (CYMBALTA ) 30 MG capsule     Musculoskeletal and Integument   Intertrigo   Groin area redness and irritation likely due to intertrigo Lotrisone  cream prescribed      Relevant Medications   clotrimazole -betamethasone  (LOTRISONE ) cream     Genitourinary   Acute cystitis without hematuria   Recent urine culture showed Klebsiella UTI Antibiotic sensitivity reviewed Prescribed Cefdinir  UTI could be a likely reason for recent fatigue as well      Relevant Medications   cefdinir  (OMNICEF ) 300 MG capsule     Other   Encounter for examination following treatment at hospital - Primary   ER chart reviewed, including blood test and imaging Cough improved with oral prednisone  now Groin area pain/irritation likely due to intertrigo Pain  likely due to osteoarthritis of hip as well        Meds ordered this encounter  Medications   cefdinir  (OMNICEF ) 300 MG capsule    Sig: Take 1 capsule (300 mg total) by mouth 2 (two) times daily.    Dispense:  10 capsule    Refill:  0   DULoxetine  (CYMBALTA ) 30 MG capsule    Sig: Take 1 capsule (30 mg total) by mouth daily.    Dispense:  30 capsule    Refill:  3   clotrimazole -betamethasone  (LOTRISONE ) cream    Sig: Apply cream twice daily to rash in groin for 10 days, then as needed    Dispense:  45 g    Refill:  1     Odarius Dines MARLA Blanch, MD     [1] No Known Allergies

## 2024-07-06 ENCOUNTER — Encounter: Payer: Self-pay | Admitting: Radiation Oncology

## 2024-07-06 ENCOUNTER — Ambulatory Visit
Admission: RE | Admit: 2024-07-06 | Discharge: 2024-07-06 | Disposition: A | Source: Ambulatory Visit | Attending: Radiation Oncology | Admitting: Radiation Oncology

## 2024-07-06 VITALS — BP 132/63 | HR 68 | Temp 97.8°F | Ht 60.0 in

## 2024-07-06 DIAGNOSIS — R54 Age-related physical debility: Secondary | ICD-10-CM | POA: Diagnosis not present

## 2024-07-06 DIAGNOSIS — C50311 Malignant neoplasm of lower-inner quadrant of right female breast: Secondary | ICD-10-CM | POA: Diagnosis present

## 2024-07-06 DIAGNOSIS — Z171 Estrogen receptor negative status [ER-]: Secondary | ICD-10-CM | POA: Diagnosis not present

## 2024-07-06 DIAGNOSIS — Z1501 Genetic susceptibility to malignant neoplasm of breast: Secondary | ICD-10-CM | POA: Diagnosis not present

## 2024-07-06 DIAGNOSIS — Z79899 Other long term (current) drug therapy: Secondary | ICD-10-CM | POA: Diagnosis not present

## 2024-07-06 DIAGNOSIS — Z803 Family history of malignant neoplasm of breast: Secondary | ICD-10-CM | POA: Diagnosis not present

## 2024-07-06 DIAGNOSIS — Z1722 Progesterone receptor negative status: Secondary | ICD-10-CM | POA: Diagnosis not present

## 2024-07-06 DIAGNOSIS — Z7982 Long term (current) use of aspirin: Secondary | ICD-10-CM | POA: Diagnosis not present

## 2024-07-06 DIAGNOSIS — Z933 Colostomy status: Secondary | ICD-10-CM | POA: Diagnosis not present

## 2024-07-06 DIAGNOSIS — Z1732 Human epidermal growth factor receptor 2 negative status: Secondary | ICD-10-CM | POA: Diagnosis not present

## 2024-07-06 NOTE — Progress Notes (Signed)
 NEW PATIENT EVALUATION  Name: [name]  MRN: [id number]  Date:   07/06/2024     DOB: October 21, 1945   This 78 y.o. 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  REFERRING PHYSICIAN: Antonetta Rollene BRAVO, MD  CHIEF COMPLAINT:  Chief Complaint  Patient presents with   Breast Cancer    DIAGNOSIS: The encounter diagnosis was Malignant neoplasm of lower-inner quadrant of right female breast, unspecified estrogen receptor status (HCC).   PREVIOUS INVESTIGATIONS:  CT scans, PET scans diagnostic mammograms and ultrasound all reviewed Pathology reports reviewed Clinical notes reviewed  HPI: Patient is a 78 year old female who presents with an abnormal mammogram of the right breast back in 1224.  She was noted to have a 1.8 cm suspicious suspicious mass in the lower outer right breast and 2 slightly abnormal right appearing axillary nodes.  Biopsy was positive for invasive poorly differentiated ductal carcinoma with extensive necrosis grade 3 triple negative.  PET scan showed mild metabolic activity associated with the median the right breast no evidence of metastatic adenopathy in the right axilla or distant disease.  Patient did have a germline mutation with BRCA2 pathologic mutation.  Patient underwent a wide local excision for a 2.1 cm invasive ductal carcinoma.  Tumor was triple negative.  She underwent 3 cycles of Taxotere  and cyclophosphamide .  Unfortunately her treatment was interrupted by diverticulitis with sepsis requiring colostomy and colostomy bag.  She never went for radiation oncology consultation.  Her original tumor was 2.1 cm margins were clear at 1.5 mm from inferior margin.  There was DCIS involved also although the margin for DCIS was 8 mm from the inferior  margin.  7 lymph nodes were examined 1 sentinel lymph node all negative for metastatic disease.  Her KIA-67 was 30%.  She is seen today for consideration of radiation therapy to her right breast.  From a breast endpoint she is doing well specifically denies breast tenderness.  She is still somewhat frail colostomy is working well.  PLANNED TREATMENT REGIMEN: PBI  PAST MEDICAL HISTORY:  has a past medical history of Asthma, Back pain, BRCA2 gene mutation positive (10/13/2023), Cancer (HCC), Depression, Diverticulosis of colon, GERD (gastroesophageal reflux disease), Hyperlipidemia, Hypertension, Obesity, Osteoarthritis of knees, bilateral, and Sleep apnea.    PAST SURGICAL HISTORY:  Past Surgical History:  Procedure Laterality Date   ABDOMINAL HYSTERECTOMY     AXILLARY SENTINEL NODE BIOPSY Right 10/22/2023   Procedure: BIOPSY, LYMPH NODE, SENTINEL, AXILLARY;  Surgeon: Evonnie Dorothyann LABOR, DO;  Location: [facility];  Service: General;  Laterality: Right;   BREAST BIOPSY Right 09/09/2023   US  RT BREAST BX W LOC DEV 1ST LESION IMG BX SPEC US  GUIDE 09/09/2023 Lennon Nest, MD [facility]-ULTR[facility]   BREAST LUMPECTOMY WITH RADIO FREQUENCY LOCALIZER Right 10/22/2023   Procedure: BREAST LUMPECTOMY WITH RADIO FREQUENCY LOCALIZER;  Surgeon: Evonnie Dorothyann LABOR, DO;  Location: [facility];  Service: General;  Laterality: Right;   COLECTOMY WITH COLOSTOMY CREATION/HARTMANN PROCEDURE N/A 02/24/2024   Procedure: COLECTOMY, WITH COLOSTOMY CREATION;  Surgeon: Jordis Laneta FALCON, MD;  Location: [facility];  Service: General;  Laterality: N/A;   COLONOSCOPY  07/23/2003   Dr. Keven Smith:numerous large scattered diverticula   COLONOSCOPY N/A 11/15/2013   Dr. Harvey: moderate diverticula, small internal hemorrhoids, redudant colon. Next TCS in 2025 with overtube.  ESOPHAGOGASTRODUODENOSCOPY (EGD) WITH ESOPHAGEAL DILATION N/A 11/15/2013   Dr. Harvey: stricture at GE junction s/p dilation. moderate erosive gastritis, negative  H.pylori   MULTIPLE TOOTH EXTRACTIONS  2024   PORTACATH PLACEMENT Left 10/22/2023   Procedure: INSERTION, TUNNELED CENTRAL VENOUS DEVICE, WITH PORT;  Surgeon: Evonnie Dorothyann LABOR, DO;  Location: [facility];  Service: General;  Laterality: Left;   TOTAL KNEE ARTHROPLASTY  06/01/2004   left / Dr. Margrette   TOTAL KNEE ARTHROPLASTY  11/29/2003   right / Dr. Margrette   TUBAL LIGATION      FAMILY HISTORY: family history includes Aneurysm in her father; Breast cancer in her maternal aunt; Cancer in her maternal aunt; Diabetes in her brother, brother, brother, father, and mother; Heart disease in her mother; Hypertension in her brother, brother, mother, and son; Prostate cancer in her maternal uncle.  SOCIAL HISTORY:  reports that she has never smoked. She has never been exposed to tobacco smoke. She has never used smokeless tobacco. She reports that she does not drink alcohol and does not use drugs.  ALLERGIES: Patient has no known allergies.  MEDICATIONS:  Current Outpatient Medications  Medication Sig Dispense Refill   albuterol  (VENTOLIN  HFA) 108 (90 Base) MCG/ACT inhaler Inhale 2 puffs into the lungs every 6 (six) hours as needed for wheezing or shortness of breath. 8.5 g 1   amLODipine  (NORVASC ) 10 MG tablet Take 10 mg by mouth daily.     aspirin EC 81 MG tablet Take 81 mg by mouth daily. Swallow whole.     atorvastatin  (LIPITOR) 20 MG tablet Take 1 tablet (20 mg total) by mouth daily. 90 tablet 3   benzonatate  (TESSALON ) 100 MG capsule Take 1 capsule (100 mg total) by mouth 3 (three) times daily as needed for cough. 30 capsule 0   BIOTIN PO Take 1 tablet by mouth daily. (Patient not taking: Reported on 06/22/2024)     bisacodyl  5 MG EC tablet Take one tablet by mouth every 3 days as needed, for constipation. Repeat once after 6 hours if still no bowel movement 30 tablet 0   cefdinir  (OMNICEF ) 300 MG capsule Take 1 capsule (300 mg total) by mouth 2 (two) times daily. 10 capsule 0    Cholecalciferol (VITAMIN D3) 125 MCG (5000 UT) CAPS Take 1 capsule (5,000 Units total) by mouth daily. 30 capsule 11   cloNIDine  (CATAPRES ) 0.2 MG tablet Take 0.2 mg by mouth daily.     clotrimazole -betamethasone  (LOTRISONE ) cream Apply cream twice daily to rash in groin for 10 days, then as needed 45 g 1   docusate sodium  (COLACE) 100 MG capsule Take 1 capsule (100 mg total) by mouth 2 (two) times daily. 60 capsule 2   DULoxetine  (CYMBALTA ) 30 MG capsule Take 1 capsule (30 mg total) by mouth daily. 30 capsule 3   famotidine (PEPCID) 10 MG tablet Take 10 mg by mouth 2 (two) times daily.     fluticasone  (FLONASE ) 50 MCG/ACT nasal spray Place 2 sprays into both nostrils daily. 16 g 0   furosemide  (LASIX ) 40 MG tablet Take 40 mg by mouth daily as needed.     gabapentin  (NEURONTIN ) 100 MG capsule TAKE ONE CAPSULE BY MOUTH AT BEDTIME (Patient taking differently: Take 100 mg by mouth 2 (two) times daily.) 90 capsule 1   Hydrocortisone (GERHARDT'S BUTT CREAM) CREA Apply 1 application topically 3 (three) times daily. (Patient not taking: Reported on 06/22/2024) 1 each 1   losartan -hydrochlorothiazide  (HYZAAR) 100-25 MG tablet Take 1 tablet by mouth  daily.     magnesium  30 MG tablet Take one tablet by mouth once daily 30 tablet 2   megestrol  (MEGACE ) 40 MG/ML suspension Take 10 mLs (400 mg total) by mouth 2 (two) times daily. 480 mL 0   Melatonin 3 MG CAPS Take 1 capsule (3 mg total) by mouth at bedtime. 30 capsule 3   metoprolol  succinate (TOPROL -XL) 50 MG 24 hr tablet TAKE 1 TABLET BY MOUTH  DAILY WITH OR IMMEDIATELY  FOLLOWING A MEAL 100 tablet 2   olopatadine  (PATANOL) 0.1 % ophthalmic solution Place 1 drop into both eyes 2 (two) times daily. 5 mL 6   omeprazole  (PRILOSEC) 20 MG capsule Take 1 capsule (20 mg total) by mouth daily. 100 capsule 2   oxyCODONE  (OXY IR/ROXICODONE ) 5 MG immediate release tablet Take 5 mg by mouth every 4 (four) hours as needed for severe pain (pain score 7-10).      polyethylene glycol powder (GLYCOLAX /MIRALAX ) 17 GM/SCOOP powder Take 17 g by mouth 2 (two) times daily as needed. 3350 g 3   potassium chloride  SA (KLOR-CON  M) 20 MEQ tablet Take 20 mEq by mouth daily.     prochlorperazine  (COMPAZINE ) 10 MG tablet Take 1 tablet (10 mg total) by mouth every 6 (six) hours as needed for nausea or vomiting. 60 tablet 3   pyridOXINE  (VITAMIN B-6) 100 MG tablet Take 200 mg by mouth daily.     sertraline  (ZOLOFT ) 25 MG tablet TAKE 1 TABLET BY MOUTH DAILY 90 tablet 3   sertraline  (ZOLOFT ) 50 MG tablet Take 1 tablet (50 mg total) by mouth daily. 30 tablet 3   temazepam  (RESTORIL ) 15 MG capsule Take 1 capsule (15 mg total) by mouth at bedtime as needed for sleep. 30 capsule 0   No current facility-administered medications for this encounter.    ECOG PERFORMANCE STATUS:  0 - Asymptomatic  REVIEW OF SYSTEMS: Patient denies any weight loss, fatigue, weakness, fever, chills or night sweats. Patient denies any loss of vision, blurred vision. Patient denies any ringing  of the ears or hearing loss. No irregular heartbeat. Patient denies heart murmur or history of fainting. Patient denies any chest pain or pain radiating to her upper extremities. Patient denies any shortness of breath, difficulty breathing at night, cough or hemoptysis. Patient denies any swelling in the lower legs. Patient denies any nausea vomiting, vomiting of blood, or coffee ground material in the vomitus. Patient denies any stomach pain. Patient states has had normal bowel movements no significant constipation or diarrhea. Patient denies any dysuria, hematuria or significant nocturia. Patient denies any problems walking, swelling in the joints or loss of balance. Patient denies any skin changes, loss of hair or loss of weight. Patient denies any excessive worrying or anxiety or significant depression. Patient denies any problems with insomnia. Patient denies excessive thirst, polyuria, polydipsia. Patient denies  any swollen glands, patient denies easy bruising or easy bleeding. Patient denies any recent infections, allergies or URI. Patient s visual fields have not changed significantly in recent time.   PHYSICAL EXAM: BP 132/63   Pulse 68   Temp 97.8 F (36.6 C) (Tympanic)   Ht 5' (1.524 m)   BMI 34.76 kg/m  Frail-appearing elderly female in NAD she has a functioning colostomy.  No dominant masses noted in either breast.  Breasts are pendulous.  No axillary or supraclavicular adenopathy is identified.  Well-developed well-nourished patient in NAD. HEENT reveals PERLA, EOMI, discs not visualized.  Oral cavity is clear. No oral mucosal lesions  are identified. Neck is clear without evidence of cervical or supraclavicular adenopathy. Lungs are clear to A&P. Cardiac examination is essentially unremarkable with regular rate and rhythm without murmur rub or thrill. Abdomen is benign with no organomegaly or masses noted. Motor sensory and DTR levels are equal and symmetric in the upper and lower extremities. Cranial nerves II through XII are grossly intact. Proprioception is intact. No peripheral adenopathy or edema is identified. No motor or sensory levels are noted. Crude visual fields are within normal range.  LABORATORY DATA: Pathology reports reviewed    RADIOLOGY RESULTS: PET scan mammograms ultrasound all reviewed compatible with above-stated findings   IMPRESSION: Stage IIa (pT2 N0 M0) triple negative invasive mammary carcinoma the right breast status post wide local excision adjuvant chemotherapy with delay in appointment with radiation oncology secondary to colostomy and sepsis due to diverticulitis in 78 year old female  PLAN: At this time I have recommended a course of APBI to her right breast.  Would plan delivering 35 Gray in 10 fractions using IMRT treatment planning and delivery.  Risks and benefits of treatment including skin reaction fatigue slight chance of alteration of blood counts and  fatigue all were discussed in detail with the patient and her family member.  I have personally set up and ordered CT simulation.  Patient comprehends my recommendations well.  I would like to take this opportunity to thank you for allowing me to participate in the care of your patient.[name] Marcey Penton, MD

## 2024-07-08 ENCOUNTER — Telehealth: Payer: Self-pay

## 2024-07-08 ENCOUNTER — Ambulatory Visit
Admission: RE | Admit: 2024-07-08 | Discharge: 2024-07-08 | Disposition: A | Source: Ambulatory Visit | Attending: Radiation Oncology | Admitting: Radiation Oncology

## 2024-07-08 DIAGNOSIS — C50311 Malignant neoplasm of lower-inner quadrant of right female breast: Secondary | ICD-10-CM | POA: Diagnosis present

## 2024-07-08 DIAGNOSIS — Z17421 Hormone receptor negative with human epidermal growth factor receptor 2 negative status: Secondary | ICD-10-CM | POA: Diagnosis not present

## 2024-07-08 DIAGNOSIS — L304 Erythema intertrigo: Secondary | ICD-10-CM | POA: Insufficient documentation

## 2024-07-08 NOTE — Assessment & Plan Note (Signed)
 ER chart reviewed, including blood test and imaging Cough improved with oral prednisone  now Groin area pain/irritation likely due to intertrigo Pain likely due to osteoarthritis of hip as well

## 2024-07-08 NOTE — Assessment & Plan Note (Addendum)
 Has bilateral hand numbness and tingling, likely due to carpal tunnel syndrome Bilateral groin area pain is less likely due to neuropathy She prefers to avoid gabapentin  for now that was prescribed from urgent care Started Cymbalta  30 mg QD, which may be helpful for GAD as well

## 2024-07-08 NOTE — Telephone Encounter (Signed)
 Copied from CRM #[id number]. Topic: General - Other >> Jul 08, 2024 11:33 AM Berwyn MATSU wrote: Reason for CRM: Lacie from [facility] [phone] called to advised that patient missed a skilled nursing visit due to patient having CT scheduled ahead of time.  Lacie was just calling to notify MD.  May you please assist.

## 2024-07-08 NOTE — Assessment & Plan Note (Addendum)
 Recent urine culture showed Klebsiella UTI Antibiotic sensitivity reviewed Prescribed Cefdinir  UTI could be a likely reason for recent fatigue as well

## 2024-07-08 NOTE — Assessment & Plan Note (Signed)
 Groin area redness and irritation likely due to intertrigo Lotrisone  cream prescribed

## 2024-07-08 NOTE — Telephone Encounter (Signed)
 Noted

## 2024-07-09 ENCOUNTER — Other Ambulatory Visit: Payer: Self-pay

## 2024-07-12 ENCOUNTER — Encounter: Payer: Self-pay | Admitting: Oncology

## 2024-07-13 ENCOUNTER — Ambulatory Visit (HOSPITAL_COMMUNITY)
Admission: RE | Admit: 2024-07-13 | Discharge: 2024-07-13 | Disposition: A | Discharge status: Home / Self Care | Source: Ambulatory Visit | Attending: Oncology | Admitting: Oncology

## 2024-07-13 ENCOUNTER — Encounter (HOSPITAL_COMMUNITY): Payer: Self-pay

## 2024-07-13 ENCOUNTER — Inpatient Hospital Stay: Attending: Hematology

## 2024-07-13 DIAGNOSIS — C50311 Malignant neoplasm of lower-inner quadrant of right female breast: Secondary | ICD-10-CM | POA: Insufficient documentation

## 2024-07-13 DIAGNOSIS — Z853 Personal history of malignant neoplasm of breast: Secondary | ICD-10-CM

## 2024-07-13 DIAGNOSIS — Z171 Estrogen receptor negative status [ER-]: Secondary | ICD-10-CM | POA: Diagnosis not present

## 2024-07-13 LAB — COMPREHENSIVE METABOLIC PANEL WITH GFR
ALT: 11 U/L (ref 0–44)
AST: 23 U/L (ref 15–41)
Albumin: 3.7 g/dL (ref 3.5–5.0)
Alkaline Phosphatase: 79 U/L (ref 38–126)
Anion gap: 12 (ref 5–15)
BUN: 12 mg/dL (ref 8–23)
CO2: 24 mmol/L (ref 22–32)
Calcium: 9.2 mg/dL (ref 8.9–10.3)
Chloride: 103 mmol/L (ref 98–111)
Creatinine, Ser: 0.69 mg/dL (ref 0.44–1.00)
GFR, Estimated: >60 mL/min
Glucose, Bld: 166 mg/dL — ABNORMAL HIGH (ref 70–99)
Potassium: 4.1 mmol/L (ref 3.5–5.1)
Sodium: 140 mmol/L (ref 135–145)
Total Bilirubin: 0.5 mg/dL (ref 0.0–1.2)
Total Protein: 6.7 g/dL (ref 6.5–8.1)

## 2024-07-13 LAB — CBC WITH DIFFERENTIAL/PLATELET
Abs Immature Granulocytes: 0.03 K/uL (ref 0.00–0.07)
Basophils Absolute: 0.1 K/uL (ref 0.0–0.1)
Basophils Relative: 1 %
Eosinophils Absolute: 0.3 K/uL (ref 0.0–0.5)
Eosinophils Relative: 4 %
HCT: 33.3 % — ABNORMAL LOW (ref 36.0–46.0)
Hemoglobin: 10.4 g/dL — ABNORMAL LOW (ref 12.0–15.0)
Immature Granulocytes: 0 %
Lymphocytes Relative: 26 %
Lymphs Abs: 2.2 K/uL (ref 0.7–4.0)
MCH: 30.1 pg (ref 26.0–34.0)
MCHC: 31.2 g/dL (ref 30.0–36.0)
MCV: 96.2 fL (ref 80.0–100.0)
Monocytes Absolute: 0.6 K/uL (ref 0.1–1.0)
Monocytes Relative: 7 %
Neutro Abs: 5.4 K/uL (ref 1.7–7.7)
Neutrophils Relative %: 62 %
Platelets: 182 K/uL (ref 150–400)
RBC: 3.46 MIL/uL — ABNORMAL LOW (ref 3.87–5.11)
RDW: 14.7 % (ref 11.5–15.5)
WBC: 8.6 K/uL (ref 4.0–10.5)
nRBC: 0 % (ref 0.0–0.2)

## 2024-07-13 LAB — IRON AND TIBC
Iron: 96 ug/dL (ref 28–170)
Saturation Ratios: 37 % — ABNORMAL HIGH (ref 10.4–31.8)
TIBC: 262 ug/dL (ref 250–450)
UIBC: 166 ug/dL

## 2024-07-13 LAB — FOLATE: Folate: 11.9 ng/mL

## 2024-07-13 LAB — FERRITIN: Ferritin: 219 ng/mL (ref 11–307)

## 2024-07-13 LAB — VITAMIN B12: Vitamin B-12: 531 pg/mL (ref 180–914)

## 2024-07-13 NOTE — Progress Notes (Signed)
 Port flushed with good blood return noted. No bruising or swelling at site. Bandaid applied and patient discharged in satisfactory condition. VVS stable with no signs or symptoms of distressed noted.

## 2024-07-13 NOTE — Patient Instructions (Signed)
 [facility] [place] - A DEPT OF [facility]  Discharge Instructions: Thank you for choosing [facility] to provide your oncology and hematology care.  If you have a lab appointment with the Cancer Center - please note that after April 8th, 2024, all labs will be drawn in the cancer center.  You do not have to check in or register with the main entrance as you have in the past but will complete your check-in in the cancer center.  Wear comfortable clothing and clothing appropriate for easy access to any Portacath or PICC line.   We strive to give you quality time with your provider. You may need to reschedule your appointment if you arrive late (15 or more minutes).  Arriving late affects you and other patients whose appointments are after yours.  Also, if you miss three or more appointments without notifying the office, you may be dismissed from the clinic at the providers discretion.      For prescription refill requests, have your pharmacy contact our office and allow 72 hours for refills to be completed.    Today you received the following port flush with labs return as scheduled.   To help prevent nausea and vomiting after your treatment, we encourage you to take your nausea medication as directed.  BELOW ARE SYMPTOMS THAT SHOULD BE REPORTED IMMEDIATELY: *FEVER GREATER THAN 100.4 F (38 C) OR HIGHER *CHILLS OR SWEATING *NAUSEA AND VOMITING THAT IS NOT CONTROLLED WITH YOUR NAUSEA MEDICATION *UNUSUAL SHORTNESS OF BREATH *UNUSUAL BRUISING OR BLEEDING *URINARY PROBLEMS (pain or burning when urinating, or frequent urination) *BOWEL PROBLEMS (unusual diarrhea, constipation, pain near the anus) TENDERNESS IN MOUTH AND THROAT WITH OR WITHOUT PRESENCE OF ULCERS (sore throat, sores in mouth, or a toothache) UNUSUAL RASH, SWELLING OR PAIN  UNUSUAL VAGINAL DISCHARGE OR ITCHING   Items with * indicate a potential emergency and should be followed up as soon as  possible or go to the Emergency Department if any problems should occur.  Please show the CHEMOTHERAPY ALERT CARD or IMMUNOTHERAPY ALERT CARD at check-in to the Emergency Department and triage nurse.  Should you have questions after your visit or need to cancel or reschedule your appointment, please contact [facility] [place] - A DEPT OF [facility] [phone]  and follow the prompts.  Office hours are 8:00 a.m. to 4:30 p.m. Monday - Friday. Please note that voicemails left after 4:00 p.m. may not be returned until the following business day.  We are closed weekends and major holidays. You have access to a nurse at all times for urgent questions. Please call the main number to the clinic [phone] and follow the prompts.  For any non-urgent questions, you may also contact your provider using MyChart. We now offer e-Visits for anyone 34 and older to request care online for non-urgent symptoms. For details visit [email or web address].   Also download the MyChart app! Go to the app store, search MyChart, open the app, select [facility], and log in with your MyChart username and password.

## 2024-07-14 ENCOUNTER — Other Ambulatory Visit: Payer: Self-pay | Admitting: *Deleted

## 2024-07-14 DIAGNOSIS — C50311 Malignant neoplasm of lower-inner quadrant of right female breast: Secondary | ICD-10-CM

## 2024-07-19 ENCOUNTER — Ambulatory Visit: Payer: Self-pay | Admitting: Family Medicine

## 2024-07-19 ENCOUNTER — Telehealth: Admitting: Physician Assistant

## 2024-07-19 ENCOUNTER — Encounter: Payer: Self-pay | Admitting: *Deleted

## 2024-07-19 DIAGNOSIS — C50311 Malignant neoplasm of lower-inner quadrant of right female breast: Secondary | ICD-10-CM | POA: Diagnosis not present

## 2024-07-19 DIAGNOSIS — R102 Pelvic and perineal pain unspecified side: Secondary | ICD-10-CM

## 2024-07-19 NOTE — Progress Notes (Signed)
 Because of having vaginal pain,  I feel your condition warrants further evaluation and I recommend that you be seen in a face to face visit with your gynecologist or at one of our [facility] clinics.   NOTE: There will be NO CHARGE for this eVisit   If you are having a true medical emergency please call 911.    *Center for [facility]             [address] [place], [place] [address] [phone] (*Take patients with no insurance)  *Center for [employer] at [facility] [address] [place], [place],  [place]  [address] [phone] (*Take patients with no insurance)  Center for [employer] at [employer]                                                             [address], [place], [place], [address] [phone]  Center for [facility] at [facility] [address], [place], [place], [address] [phone]  Center for [facility] at [facility] [address], [place], [place], [address] [phone]  Center for [facility] at [facility]                                 [address] [place], [place], [place], [address] [phone]  Center for [facility] at [facility]                                    [address] [place], [place], [address] [phone]  Center for [facility] at [facility] [address], [place], [place], [address]                              [facility] [address], [place], [place], [address] [phone]  Your MyChart E-visit questionnaire answers were reviewed by a board certified advanced clinical practitioner to complete your personal care plan based on your specific symptoms.  Thank you for using e-Visits.

## 2024-07-19 NOTE — Telephone Encounter (Signed)
 FYI Only or Action Required?: Action required by provider: Medication Request.  Patient was last seen in primary care on 07/05/2024 by Mallory Suzzane POUR, MD.  Called Nurse Triage reporting Urinary Frequency.  Symptoms began yesterday.  Interventions attempted: Nothing.  Symptoms are: gradually worsening.  Triage Disposition: See Physician Within 24 Hours  Patient/caregiver understands and will follow disposition?: No, wishes to speak with PCP   Copied from CRM #[id number]. Topic: Clinical - Red Word Triage >> Jul 19, 2024  2:14 PM [name] wrote: Pt is having groin pain, urgency to urinate, and trouble walking. Reason for Disposition  Urinating more frequently than usual (i.e., frequency) OR new-onset of the feeling of an urgent need to urinate (i.e., urgency)  Answer Assessment - Initial Assessment Questions 1. SYMPTOM: What's the main symptom you're concerned about? (e.g., frequency, incontinence)      Urgency and groin pain  2. ONSET: When did the  frequency  start?      Last night  3. PAIN: Is there any pain? If Yes, ask: How bad is it? (Scale: 1-10; mild, moderate, severe)      Pt have difficulty walking due to pain in groin  4. CAUSE: What do you think is causing the symptoms?      UTI pt's son thinks  5. OTHER SYMPTOMS: Pt's son denies blood in urine, fever, pain with urination       Pt reports back pain  Pt's son reports UTI s/sx Pt's son states patient is to start having radiation next week and has a suppressed immune system, therefore, would prefer not to bring pt into the clinic. Pt's son is requesting to have an antibiotic sent to pharmacy listed below if possible. [facility] [address] [place], [place] [address] #[phone] Pt agrees with plan of care, will call back for any worsening symptoms  Protocols used: Urinary Symptoms-A-AH

## 2024-07-20 ENCOUNTER — Ambulatory Visit
Admission: RE | Admit: 2024-07-20 | Discharge: 2024-07-20 | Disposition: A | Discharge status: Home / Self Care | Source: Ambulatory Visit | Attending: Radiation Oncology | Admitting: Radiation Oncology

## 2024-07-20 ENCOUNTER — Inpatient Hospital Stay: Admitting: Oncology

## 2024-07-20 ENCOUNTER — Other Ambulatory Visit: Payer: Self-pay

## 2024-07-20 DIAGNOSIS — C50311 Malignant neoplasm of lower-inner quadrant of right female breast: Secondary | ICD-10-CM | POA: Diagnosis not present

## 2024-07-20 MED ORDER — SULFAMETHOXAZOLE-TRIMETHOPRIM 800-160 MG PO TABS: 1.0000 | ORAL_TABLET | Freq: Two times a day (BID) | ORAL | 0 refills | Status: DC

## 2024-07-20 MED ORDER — SULFAMETHOXAZOLE-TRIMETHOPRIM 800-160 MG PO TABS: 1.0000 | ORAL_TABLET | Freq: Two times a day (BID) | ORAL | 0 refills | Status: AC

## 2024-07-20 NOTE — Telephone Encounter (Signed)
 Seems as  though she may have a UTI, Had one 12/04 based on record review I will prescribe 5 days of septra and encourage her to ensure at least 60 ounces water  daily  If her symptoms do not go away completely or if they worsen needs clinical evaluation . If she has fever, flanj pain , nausea or chills needs clinical evaluation today.  Pls speak with son [name] so he is aware  Medication sent [facility] [place], if he needs it elsewhere pls send where it needs to go

## 2024-07-20 NOTE — Telephone Encounter (Signed)
 Pt son notified.

## 2024-07-24 ENCOUNTER — Other Ambulatory Visit: Payer: Self-pay | Admitting: Family Medicine

## 2024-07-26 ENCOUNTER — Other Ambulatory Visit: Payer: Self-pay

## 2024-07-26 ENCOUNTER — Ambulatory Visit
Admission: RE | Admit: 2024-07-26 | Discharge: 2024-07-26 | Disposition: A | Discharge status: Home / Self Care | Source: Ambulatory Visit | Attending: Radiation Oncology | Admitting: Radiation Oncology

## 2024-07-26 DIAGNOSIS — Z17421 Hormone receptor negative with human epidermal growth factor receptor 2 negative status: Secondary | ICD-10-CM | POA: Insufficient documentation

## 2024-07-26 DIAGNOSIS — C50311 Malignant neoplasm of lower-inner quadrant of right female breast: Secondary | ICD-10-CM | POA: Insufficient documentation

## 2024-07-26 LAB — RAD ONC ARIA SESSION SUMMARY
Course Elapsed Days: 0
Plan Fractions Treated to Date: 1
Plan Prescribed Dose Per Fraction: 3.5 Gy
Plan Total Fractions Prescribed: 10
Plan Total Prescribed Dose: 35 Gy
Reference Point Dosage Given to Date: 3.5 Gy
Reference Point Session Dosage Given: 3.5 Gy
Session Number: 1

## 2024-07-27 ENCOUNTER — Other Ambulatory Visit: Payer: Self-pay

## 2024-07-27 ENCOUNTER — Ambulatory Visit
Admission: RE | Admit: 2024-07-27 | Discharge: 2024-07-27 | Disposition: A | Source: Ambulatory Visit | Attending: Radiation Oncology | Admitting: Radiation Oncology

## 2024-07-27 LAB — RAD ONC ARIA SESSION SUMMARY
Course Elapsed Days: 1
Plan Fractions Treated to Date: 2
Plan Prescribed Dose Per Fraction: 3.5 Gy
Plan Total Fractions Prescribed: 10
Plan Total Prescribed Dose: 35 Gy
Reference Point Dosage Given to Date: 7 Gy
Reference Point Session Dosage Given: 3.5 Gy
Session Number: 2

## 2024-07-28 ENCOUNTER — Ambulatory Visit
Admission: RE | Admit: 2024-07-28 | Discharge: 2024-07-28 | Disposition: A | Discharge status: Home / Self Care | Source: Ambulatory Visit | Attending: Radiation Oncology | Admitting: Radiation Oncology

## 2024-07-28 ENCOUNTER — Other Ambulatory Visit: Payer: Self-pay

## 2024-07-28 LAB — RAD ONC ARIA SESSION SUMMARY
Course Elapsed Days: 2
Plan Fractions Treated to Date: 3
Plan Prescribed Dose Per Fraction: 3.5 Gy
Plan Total Fractions Prescribed: 10
Plan Total Prescribed Dose: 35 Gy
Reference Point Dosage Given to Date: 10.5 Gy
Reference Point Session Dosage Given: 3.5 Gy
Session Number: 3

## 2024-07-29 ENCOUNTER — Other Ambulatory Visit: Payer: Self-pay

## 2024-07-29 ENCOUNTER — Other Ambulatory Visit (HOSPITAL_COMMUNITY): Payer: Self-pay | Admitting: Oncology

## 2024-07-29 ENCOUNTER — Ambulatory Visit
Admission: RE | Admit: 2024-07-29 | Discharge: 2024-07-29 | Disposition: A | Discharge status: Home / Self Care | Source: Ambulatory Visit | Attending: Radiation Oncology | Admitting: Radiation Oncology

## 2024-07-29 ENCOUNTER — Inpatient Hospital Stay: Attending: Hematology | Admitting: Oncology

## 2024-07-29 VITALS — BP 158/68 | HR 77 | Temp 97.8°F | Resp 18 | Ht 60.0 in | Wt 173.0 lb

## 2024-07-29 DIAGNOSIS — Z1501 Genetic susceptibility to malignant neoplasm of breast: Secondary | ICD-10-CM

## 2024-07-29 DIAGNOSIS — Z171 Estrogen receptor negative status [ER-]: Secondary | ICD-10-CM | POA: Diagnosis not present

## 2024-07-29 DIAGNOSIS — Z1509 Genetic susceptibility to other malignant neoplasm: Secondary | ICD-10-CM | POA: Diagnosis not present

## 2024-07-29 DIAGNOSIS — G6289 Other specified polyneuropathies: Secondary | ICD-10-CM

## 2024-07-29 DIAGNOSIS — Z1589 Genetic susceptibility to other disease: Secondary | ICD-10-CM

## 2024-07-29 DIAGNOSIS — Z15068 Genetic susceptibility to other malignant neoplasm of digestive system: Secondary | ICD-10-CM | POA: Diagnosis not present

## 2024-07-29 DIAGNOSIS — C50311 Malignant neoplasm of lower-inner quadrant of right female breast: Secondary | ICD-10-CM | POA: Diagnosis not present

## 2024-07-29 DIAGNOSIS — Z933 Colostomy status: Secondary | ICD-10-CM

## 2024-07-29 DIAGNOSIS — Z1507 Genetic susceptibility to malignant neoplasm of urinary tract: Secondary | ICD-10-CM | POA: Diagnosis not present

## 2024-07-29 DIAGNOSIS — Z853 Personal history of malignant neoplasm of breast: Secondary | ICD-10-CM

## 2024-07-29 LAB — RAD ONC ARIA SESSION SUMMARY
Course Elapsed Days: 3
Plan Fractions Treated to Date: 4
Plan Prescribed Dose Per Fraction: 3.5 Gy
Plan Total Fractions Prescribed: 10
Plan Total Prescribed Dose: 35 Gy
Reference Point Dosage Given to Date: 14 Gy
Reference Point Session Dosage Given: 3.5 Gy
Session Number: 4

## 2024-07-29 NOTE — Progress Notes (Signed)
 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 Further chemotherapy discontinued for severe decline in functional status. Started radiation in [name] on 07/26/2024    -Patient has germline BRCA2 mutation.  Will qualify for 1 year of adjuvant olaparib after completing radiation - We reviewed the recent mammogram findings together. Benign - Patient wishes to complete her colostomy reversal prior to start of Olaparib  Return to clinic in 6 weeks to discuss starting Olaparib  Other polyneuropathy Tingling in the fingers of both hands from carpal tunnel is stable.  No tingling in the legs reported.    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  She knows to contact our office if she develops concerns prior to her next appointment.  40 minutes of total time was spent for this patient encounter, including preparation, face-to-face  counseling with the patient and coordination of care, physical exam, and Wagner of the encounter.   [name],acting as a neurosurgeon for [name], [name] on the behalf of [name], [name],as directed by  [name], [name] while in the presence of [name], [name].  I, [name], have reviewed the above Wagner for accuracy and completeness, and I agree with the above.     [name], [name]  [facility] [place] - A DEPT OF [name] [facility] [address] [place] [name] Dept: [phone] Dept Fax: [phone]   Orders Placed This Encounter  Procedures   MM DIAG BREAST TOMO BILATERAL    Standing Status:   Future    Expiration Date:   07/29/2025    Reason for Exam (SYMPTOM  OR DIAGNOSIS REQUIRED):   breast cancer screening, hx of breast cancer    Preferred imaging location?:   [facility]   CBC with Differential    Standing Status:   Future    Expected Date:   09/09/2024    Expiration Date:   12/08/2024   Comprehensive metabolic panel    Standing Status:   Future    Expected Date:   09/09/2024    Expiration Date:   12/08/2024   Magnesium     Standing Status:   Future    Expected Date:   09/09/2024    Expiration Date:   12/08/2024     ONCOLOGY HISTORY:   I have reviewed her chart and materials related to her cancer extensively and collaborated history with  the patient. 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 In the left breast, no findings suspicious for malignancy. -08/26/2023: Mammogram/ultrasound: 1.8 cm suspicious mass within the lower outer right breast. Two slightly abnormal appearing RIGHT axillary lymph nodes. 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  Triple negative, Ki-67: 30% -09/25/2023: PET scan: Mild metabolic activity associated with the medial RIGHT breast mass consistent with primary breast carcinoma. No evidence of metastatic adenopathy in the RIGHT axilla or central thorax. No evidence distant metastatic disease. 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 0/6 lymph nodes negative, pT2, pN0  -12/04/2023-02/17/2024: dd Taxotere  + Cyclophosphamide . Delayed cycle 4 because of hospital admission for diverticulitis. Discontinued further without ddAC for severe decline in functional status after hospitalization -07/13/2024: Mammogram: BI-RADS Category 2: Benign   Current Treatment:  Surveillance  INTERVAL HISTORY:   [name] is here today for follow up. Patient is accompanied by her caregiver today.   [name] is tolerating radiation well and will complete treatment on 08/07/2023. We discussed starting oral chemotherapy after completion of radiation. Starting her chemotherapy will be slightly delayed, to allow for colostomy reversal due to diverticulitis.   [name] notes pain in the lower pelvic and bilateral thigh area. She was previously  She is taking oxycodone  and Tylenol  daily without improvement in pain.   I have reviewed the past medical history, past surgical history, social history and family history with the patient and they are unchanged from previous note.  ALLERGIES:  has no known allergies.  MEDICATIONS:  Current Outpatient Medications  Medication Sig Dispense Refill   albuterol  (VENTOLIN  HFA) 108 (90 Base) MCG/ACT inhaler  Inhale 2 puffs into the lungs every 6 (six) hours as needed for wheezing or shortness of breath. 8.5 g 1   amLODipine  (NORVASC ) 10 MG tablet TAKE ONE TABLET BY MOUTH EVERY DAY 90 tablet 1   aspirin EC 81 MG tablet Take 81 mg by mouth daily. Swallow whole.     atorvastatin  (LIPITOR) 20 MG tablet Take 1 tablet (20 mg total) by mouth daily. 90 tablet 3   benzonatate  (TESSALON ) 100 MG capsule Take 1 capsule (100 mg total) by mouth 3 (three) times daily as needed for cough. 30 capsule 0   BIOTIN PO Take 1 tablet by mouth daily. (Patient not taking: Reported on 06/22/2024)     bisacodyl  5 MG EC tablet Take one tablet by mouth every 3 days as needed, for constipation. Repeat once after 6 hours if still no bowel movement 30 tablet 0   cefdinir  (OMNICEF ) 300 MG capsule Take 1 capsule (300 mg total) by mouth 2 (two) times daily. 10 capsule 0   Cholecalciferol (VITAMIN D3) 125 MCG (5000 UT) CAPS Take 1 capsule (5,000 Units total) by mouth daily. 30 capsule 11   cloNIDine  (CATAPRES ) 0.2 MG tablet Take 0.2 mg by mouth daily.     clotrimazole -betamethasone  (LOTRISONE ) cream Apply cream twice daily to rash in groin for 10 days, then as needed 45 g 1   docusate sodium  (COLACE) 100 MG capsule Take  1 capsule (100 mg total) by mouth 2 (two) times daily. 60 capsule 2   DULoxetine  (CYMBALTA ) 30 MG capsule Take 1 capsule (30 mg total) by mouth daily. 30 capsule 3   famotidine (PEPCID) 10 MG tablet Take 10 mg by mouth 2 (two) times daily.     fluticasone  (FLONASE ) 50 MCG/ACT nasal spray Place 2 sprays into both nostrils daily. 16 g 0   furosemide  (LASIX ) 40 MG tablet Take 40 mg by mouth daily as needed.     gabapentin  (NEURONTIN ) 100 MG capsule TAKE ONE CAPSULE BY MOUTH AT BEDTIME 90 capsule 1   Hydrocortisone (GERHARDT'S BUTT CREAM) CREA Apply 1 application topically 3 (three) times daily. (Patient not taking: Reported on 06/22/2024) 1 each 1   losartan -hydrochlorothiazide  (HYZAAR) 100-25 MG tablet Take 1 tablet by mouth  daily.     magnesium  30 MG tablet Take one tablet by mouth once daily 30 tablet 2   megestrol  (MEGACE ) 40 MG/ML suspension Take 10 mLs (400 mg total) by mouth 2 (two) times daily. 480 mL 0   Melatonin 3 MG CAPS Take 1 capsule (3 mg total) by mouth at bedtime. 30 capsule 3   metoprolol  succinate (TOPROL -XL) 50 MG 24 hr tablet TAKE 1 TABLET BY MOUTH  DAILY WITH OR IMMEDIATELY  FOLLOWING A MEAL 100 tablet 2   olopatadine  (PATANOL) 0.1 % ophthalmic solution Place 1 drop into both eyes 2 (two) times daily. 5 mL 6   omeprazole  (PRILOSEC) 20 MG capsule Take 1 capsule (20 mg total) by mouth daily. 100 capsule 2   oxyCODONE  (OXY IR/ROXICODONE ) 5 MG immediate release tablet Take 5 mg by mouth every 4 (four) hours as needed for severe pain (pain score 7-10).     polyethylene glycol powder (GLYCOLAX /MIRALAX ) 17 GM/SCOOP powder Take 17 g by mouth 2 (two) times daily as needed. 3350 g 3   potassium chloride  SA (KLOR-CON  M) 20 MEQ tablet Take 20 mEq by mouth daily.     prochlorperazine  (COMPAZINE ) 10 MG tablet Take 1 tablet (10 mg total) by mouth every 6 (six) hours as needed for nausea or vomiting. 60 tablet 3   pyridOXINE  (VITAMIN B-6) 100 MG tablet Take 200 mg by mouth daily.     sertraline  (ZOLOFT ) 25 MG tablet TAKE 1 TABLET BY MOUTH DAILY 90 tablet 3   sertraline  (ZOLOFT ) 50 MG tablet Take 1 tablet (50 mg total) by mouth daily. 30 tablet 3   sulfamethoxazole -trimethoprim  (BACTRIM  DS) 800-160 MG tablet Take 1 tablet by mouth 2 (two) times daily. 10 tablet 0   temazepam  (RESTORIL ) 15 MG capsule Take 1 capsule (15 mg total) by mouth at bedtime as needed for sleep. 30 capsule 0   No current facility-administered medications for this visit.   VITALS:  Blood pressure (!) 158/68, pulse 77, temperature 97.8 F (36.6 C), temperature source Tympanic, resp. rate 18, height 5' (1.524 m), weight 173 lb (78.5 kg), SpO2 99%.  Wt Readings from Last 3 Encounters:  07/29/24 173 lb (78.5 kg)  07/05/24 178 lb (80.7 kg)   06/24/24 175 lb (79.4 kg)    Body mass index is 33.79 kg/m.  Performance status (ECOG): 3 - Symptomatic, >50% confined to bed  PHYSICAL EXAM:   Wagner:alert, no distress and comfortable, in a wheelchair SKIN: skin color, texture, turgor are normal, no rashes or significant lesions BREAST: Right breast lumpectomy scar present no lumps palpated.  Left breast exam: Normal LYMPH:  no palpable lymphadenopathy in the cervical, axillary or inguinal LUNGS: clear to  auscultation and percussion with normal breathing effort HEART: regular rate & rhythm and no murmurs and no lower extremity edema ABDOMEN:abdomen soft, non-tender and normal bowel sounds, colostomy bag present Musculoskeletal:no cyanosis of digits and no clubbing  NEURO: alert & oriented x 3 with fluent speech  LABORATORY DATA:  I have reviewed the data as listed   Lab Results  Component Value Date   WBC 8.6 07/13/2024   NEUTROABS 5.4 07/13/2024   HGB 10.4 (L) 07/13/2024   HCT 33.3 (L) 07/13/2024   MCV 96.2 07/13/2024   PLT 182 07/13/2024     Chemistry      Component Value Date/Time   NA 140 07/13/2024 1308   NA 143 06/27/2023 1155   K 4.1 07/13/2024 1308   CL 103 07/13/2024 1308   CO2 24 07/13/2024 1308   BUN 12 07/13/2024 1308   BUN 17 06/27/2023 1155   CREATININE 0.69 07/13/2024 1308   CREATININE 0.78 02/15/2020 0848      Component Value Date/Time   CALCIUM  9.2 07/13/2024 1308   ALKPHOS 79 07/13/2024 1308   AST 23 07/13/2024 1308   ALT 11 07/13/2024 1308   BILITOT 0.5 07/13/2024 1308   BILITOT 0.6 06/27/2023 1155       RADIOGRAPHIC STUDIES: I have personally reviewed the radiological report below  MM DIAG BREAST TOMO BILATERAL CLINICAL DATA:  Post lumpectomy mammographic surveillance. Patient underwent a right breast lumpectomy for carcinoma in April 2025. She has not yet undergone adjuvant radiation therapy. No current breast complaints.  EXAM: DIGITAL DIAGNOSTIC BILATERAL MAMMOGRAM WITH  TOMOSYNTHESIS AND CAD  TECHNIQUE: Bilateral digital diagnostic mammography and breast tomosynthesis was performed. The images were evaluated with computer-aided detection.  COMPARISON:  Previous exam(s).  ACR Breast Density Category b: There are scattered areas of fibroglandular density.  FINDINGS: Post lumpectomy scarring is noted in the inferior, slightly medial aspect of the right breast.  There are no breast masses, areas of significant asymmetry, areas of nonsurgical architectural distortion or suspicious calcifications.  IMPRESSION: 1. No evidence of new or recurrent breast malignancy. 2. Benign post lumpectomy changes on the right.  RECOMMENDATION: 1. Diagnostic mammography in 1 year per standard post lumpectomy protocol.  I have discussed the findings and recommendations with the patient. If applicable, a reminder letter will be sent to the patient regarding the next appointment.  BI-RADS CATEGORY  2: Benign.  Electronically Signed   By: Alm Parkins M.D.   On: 07/13/2024 14:27      "

## 2024-07-29 NOTE — Patient Instructions (Addendum)
 [facility] at [facility] Discharge Instructions   You were seen and examined today by Dr. Davonna.  She reviewed the results of your lab work which are normal/stable.   She reviewed the results of your mammogram which did not show any evidence of cancer.   We will see you back about 4 weeks after your final radiation treatment.   Return as scheduled.    Thank you for choosing [facility] at [facility] to provide your oncology and hematology care.  To afford each patient quality time with our provider, please arrive at least 15 minutes before your scheduled appointment time.   If you have a lab appointment with the Cancer Center please come in thru the Main Entrance and check in at the main information desk.  You need to re-schedule your appointment should you arrive 10 or more minutes late.  We strive to give you quality time with our providers, and arriving late affects you and other patients whose appointments are after yours.  Also, if you no show three or more times for appointments you may be dismissed from the clinic at the providers discretion.     Again, thank you for choosing [facility].  Our hope is that these requests will decrease the amount of time that you wait before being seen by our physicians.       _____________________________________________________________  Should you have questions after your visit to [facility], please contact our office at [phone] and follow the prompts.  Our office hours are 8:00 a.m. and 4:30 p.m. Monday - Friday.  Please note that voicemails left after 4:00 p.m. may not be returned until the following business day.  We are closed weekends and major holidays.  You do have access to a nurse 24-7, just call the main number to the clinic [phone] and do not press any options, hold on the line and a nurse will answer the phone.    For prescription refill requests,  have your pharmacy contact our office and allow 72 hours.    Due to Covid, you will need to wear a mask upon entering the hospital. If you do not have a mask, a mask will be given to you at the Main Entrance upon arrival. For doctor visits, patients may have 1 support person age 26 or older with them. For treatment visits, patients can not have anyone with them due to social distancing guidelines and our immunocompromised population.

## 2024-07-30 ENCOUNTER — Inpatient Hospital Stay

## 2024-07-30 ENCOUNTER — Ambulatory Visit
Admission: RE | Admit: 2024-07-30 | Discharge: 2024-07-30 | Disposition: A | Source: Ambulatory Visit | Attending: Radiation Oncology | Admitting: Radiation Oncology

## 2024-07-30 ENCOUNTER — Other Ambulatory Visit: Payer: Self-pay

## 2024-07-30 DIAGNOSIS — C50311 Malignant neoplasm of lower-inner quadrant of right female breast: Secondary | ICD-10-CM

## 2024-07-30 LAB — CBC (CANCER CENTER ONLY)
HCT: 36.7 % (ref 36.0–46.0)
Hemoglobin: 11.6 g/dL — ABNORMAL LOW (ref 12.0–15.0)
MCH: 30.2 pg (ref 26.0–34.0)
MCHC: 31.6 g/dL (ref 30.0–36.0)
MCV: 95.6 fL (ref 80.0–100.0)
Platelet Count: 214 K/uL (ref 150–400)
RBC: 3.84 MIL/uL — ABNORMAL LOW (ref 3.87–5.11)
RDW: 13.7 % (ref 11.5–15.5)
WBC Count: 7.1 K/uL (ref 4.0–10.5)
nRBC: 0 % (ref 0.0–0.2)

## 2024-07-30 LAB — RAD ONC ARIA SESSION SUMMARY
Course Elapsed Days: 4
Plan Fractions Treated to Date: 5
Plan Prescribed Dose Per Fraction: 3.5 Gy
Plan Total Fractions Prescribed: 10
Plan Total Prescribed Dose: 35 Gy
Reference Point Dosage Given to Date: 17.5 Gy
Reference Point Session Dosage Given: 3.5 Gy
Session Number: 5

## 2024-08-02 ENCOUNTER — Ambulatory Visit
Admission: RE | Admit: 2024-08-02 | Discharge: 2024-08-02 | Disposition: A | Source: Ambulatory Visit | Attending: Radiation Oncology | Admitting: Radiation Oncology

## 2024-08-02 ENCOUNTER — Other Ambulatory Visit: Payer: Self-pay

## 2024-08-02 LAB — RAD ONC ARIA SESSION SUMMARY
Course Elapsed Days: 7
Plan Fractions Treated to Date: 6
Plan Prescribed Dose Per Fraction: 3.5 Gy
Plan Total Fractions Prescribed: 10
Plan Total Prescribed Dose: 35 Gy
Reference Point Dosage Given to Date: 21 Gy
Reference Point Session Dosage Given: 3.5 Gy
Session Number: 6

## 2024-08-03 ENCOUNTER — Ambulatory Visit
Admission: RE | Admit: 2024-08-03 | Discharge: 2024-08-03 | Disposition: A | Source: Ambulatory Visit | Attending: Radiation Oncology | Admitting: Radiation Oncology

## 2024-08-03 ENCOUNTER — Other Ambulatory Visit: Payer: Self-pay

## 2024-08-03 LAB — RAD ONC ARIA SESSION SUMMARY
Course Elapsed Days: 8
Plan Fractions Treated to Date: 7
Plan Prescribed Dose Per Fraction: 3.5 Gy
Plan Total Fractions Prescribed: 10
Plan Total Prescribed Dose: 35 Gy
Reference Point Dosage Given to Date: 24.5 Gy
Reference Point Session Dosage Given: 3.5 Gy
Session Number: 7

## 2024-08-04 ENCOUNTER — Other Ambulatory Visit: Payer: Self-pay

## 2024-08-04 ENCOUNTER — Ambulatory Visit
Admission: RE | Admit: 2024-08-04 | Discharge: 2024-08-04 | Disposition: A | Discharge status: Home / Self Care | Source: Ambulatory Visit | Attending: Radiation Oncology | Admitting: Radiation Oncology

## 2024-08-04 LAB — RAD ONC ARIA SESSION SUMMARY
Course Elapsed Days: 9
Plan Fractions Treated to Date: 8
Plan Prescribed Dose Per Fraction: 3.5 Gy
Plan Total Fractions Prescribed: 10
Plan Total Prescribed Dose: 35 Gy
Reference Point Dosage Given to Date: 28 Gy
Reference Point Session Dosage Given: 3.5 Gy
Session Number: 8

## 2024-08-05 ENCOUNTER — Ambulatory Visit
Admission: RE | Admit: 2024-08-05 | Discharge: 2024-08-05 | Disposition: A | Source: Ambulatory Visit | Attending: Radiation Oncology | Admitting: Radiation Oncology

## 2024-08-05 ENCOUNTER — Other Ambulatory Visit: Payer: Self-pay

## 2024-08-05 LAB — RAD ONC ARIA SESSION SUMMARY
Course Elapsed Days: 10
Plan Fractions Treated to Date: 9
Plan Prescribed Dose Per Fraction: 3.5 Gy
Plan Total Fractions Prescribed: 10
Plan Total Prescribed Dose: 35 Gy
Reference Point Dosage Given to Date: 31.5 Gy
Reference Point Session Dosage Given: 3.5 Gy
Session Number: 9

## 2024-08-06 ENCOUNTER — Other Ambulatory Visit: Payer: Self-pay

## 2024-08-06 ENCOUNTER — Ambulatory Visit
Admission: RE | Admit: 2024-08-06 | Discharge: 2024-08-06 | Disposition: A | Discharge status: Home / Self Care | Source: Ambulatory Visit | Attending: Radiation Oncology | Admitting: Radiation Oncology

## 2024-08-06 LAB — RAD ONC ARIA SESSION SUMMARY
Course Elapsed Days: 11
Plan Fractions Treated to Date: 10
Plan Prescribed Dose Per Fraction: 3.5 Gy
Plan Total Fractions Prescribed: 10
Plan Total Prescribed Dose: 35 Gy
Reference Point Dosage Given to Date: 35 Gy
Reference Point Session Dosage Given: 3.5 Gy
Session Number: 10

## 2024-08-09 NOTE — Radiation Completion Notes (Signed)
 Patient Name: [name], ARMETTA MRN: [id number] Date of Birth: 1945-08-13 Referring Physician: ROLLENE PESA, M.D. Date of Service: 2024-08-09 Radiation Oncologist: Marcey Penton, M.D. [facility]                             RADIATION ONCOLOGY END OF TREATMENT NOTE     Diagnosis: C50.311 Malignant neoplasm of lower-inner quadrant of right female breast Staging on 2023-11-19: Breast cancer of lower-inner quadrant of right female breast (HCC) T=pT2, N=pN0, M=cM0 Intent: Curative     HPI: Patient is a 79 year old female who presents with an abnormal mammogram of the right breast back in 1224.  She was noted to have a 1.8 cm suspicious suspicious mass in the lower outer right breast and 2 slightly abnormal right appearing axillary nodes.  Biopsy was positive for invasive poorly differentiated ductal carcinoma with extensive necrosis grade 3 triple negative.  PET scan showed mild metabolic activity associated with the median the right breast no evidence of metastatic adenopathy in the right axilla or distant disease.  Patient did have a germline mutation with BRCA2 pathologic mutation.  Patient underwent a wide local excision for a 2.1 cm invasive ductal carcinoma.  Tumor was triple negative.  She underwent 3 cycles of Taxotere  and cyclophosphamide .  Unfortunately her treatment was interrupted by diverticulitis with sepsis requiring colostomy and colostomy bag.  She never went for radiation oncology consultation.  Her original tumor was 2.1 cm margins were clear at 1.5 mm from inferior margin.  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  7 lymph nodes were examined 1 sentinel lymph node all negative for metastatic disease.  Her KIA67 was 30%.  She is seen today for consideration of radiation therapy to her right breast.  From a breast endpoint she is doing well specifically denies breast tenderness.  She is still somewhat frail  colostomy is working well.      ==========DELIVERED PLANS==========  First Treatment Date: 2024-07-26 Last Treatment Date: 2024-08-06   Plan Name: Breast_R_APBI Site: Breast, Right Technique: 3D Mode: Photon Dose Per Fraction: 3.5 Gy Prescribed Dose (Delivered / Prescribed): 35 Gy / 35 Gy Prescribed Fxs (Delivered / Prescribed): 10 / 10     ==========ON TREATMENT VISIT DATES========== 2024-07-27, 2024-08-03     ==========UPCOMING VISITS========== 09/10/2024 [facility] PORT FLUSH W/LAB [facility] NURSE  09/10/2024 [facility] OFFICE VISIT 20 Davonna Siad, MD  09/09/2024 [facility] RAD ONCOLOGY FOLLOW UP 30 Penton Marcey, MD  08/16/2024 [facility] SURGICAL FOLLOW UP 15 Pabon, Diego F, MD        ==========APPENDIX - ON TREATMENT VISIT NOTES==========   See weekly On Treatment Notes in Epic for details in the Media tab (listed as Progress notes on the On Treatment Visit Dates listed above).

## 2024-08-10 ENCOUNTER — Other Ambulatory Visit: Payer: Self-pay | Admitting: Family Medicine

## 2024-08-10 ENCOUNTER — Other Ambulatory Visit: Payer: Self-pay | Admitting: Internal Medicine

## 2024-08-10 ENCOUNTER — Encounter: Payer: Self-pay | Admitting: Family Medicine

## 2024-08-10 DIAGNOSIS — C50311 Malignant neoplasm of lower-inner quadrant of right female breast: Secondary | ICD-10-CM

## 2024-08-10 DIAGNOSIS — F409 Phobic anxiety disorder, unspecified: Secondary | ICD-10-CM

## 2024-08-10 MED ORDER — PROCHLORPERAZINE MALEATE 10 MG PO TABS: 10.0000 mg | ORAL_TABLET | Freq: Three times a day (TID) | ORAL | 1 refills | Status: AC | PRN

## 2024-08-10 MED ORDER — TEMAZEPAM 15 MG PO CAPS: 15.0000 mg | ORAL_CAPSULE | Freq: Every evening | ORAL | 3 refills | Status: AC | PRN

## 2024-08-10 NOTE — Progress Notes (Signed)
 Prescriptions for compazine  and restoril  are refilled

## 2024-08-11 ENCOUNTER — Ambulatory Visit: Admitting: Surgery

## 2024-08-11 ENCOUNTER — Encounter: Payer: Self-pay | Admitting: Surgery

## 2024-08-11 ENCOUNTER — Telehealth: Payer: Self-pay

## 2024-08-11 ENCOUNTER — Telehealth (HOSPITAL_BASED_OUTPATIENT_CLINIC_OR_DEPARTMENT_OTHER): Payer: Self-pay

## 2024-08-11 VITALS — BP 135/79 | HR 91 | Ht 60.0 in | Wt 173.6 lb

## 2024-08-11 DIAGNOSIS — Z433 Encounter for attention to colostomy: Secondary | ICD-10-CM | POA: Diagnosis not present

## 2024-08-11 DIAGNOSIS — Z933 Colostomy status: Secondary | ICD-10-CM

## 2024-08-11 DIAGNOSIS — R0600 Dyspnea, unspecified: Secondary | ICD-10-CM | POA: Diagnosis not present

## 2024-08-11 NOTE — Telephone Encounter (Signed)
 Faxed cardiac clearance to Dr. Debera at [phone].

## 2024-08-11 NOTE — Telephone Encounter (Signed)
"  ° °  Pre-operative Risk Assessment    Patient Name: [name]  DOB: 25-Oct-1945 MRN: [id number]   Date of last office visit: 08/19/22 with Debera Date of next office visit: 09/07/24 with [facility]   Request for Surgical Clearance    Procedure:  Colostomy [place]  Date of Surgery:  Clearance TBD                                 Surgeon:  Dr. Jordis Socks Group or Practice Name:  [facility] Phone number:  [phone] Fax number:  [phone]   Type of Clearance Requested:   - Medical  - Pharmacy:  Hold Aspirin not indicated   Type of Anesthesia:  General    Additional requests/questions:  Surgeon is requesting EKG  Signed, Augustin JONETTA Daring   08/11/2024, 4:50 PM   "

## 2024-08-11 NOTE — Patient Instructions (Signed)
Minimally Invasive Partial Colectomy, Adult, Care After The following information offers guidance on how to care for yourself after your procedure. Your health care provider may also give you more specific instructions. If you have problems or questions, contact your health care provider. What can I expect after the surgery? After the procedure, it is common to have: Pain, bruising, and swelling. Bloating. Weakness and tiredness (fatigue). Changes to your bowel movements, especially having bowel movements more often. Follow these instructions at home: Medicines Take over-the-counter and prescription medicines only as told by your health care provider. If you were prescribed an antibiotic medicine, take it as told by your health care provider. Do not stop using the antibiotic even if you start to feel better. Ask your health care provider if the medicine prescribed to you: Requires you to avoid driving or using machinery. Can cause constipation. You may need to take these actions to prevent or treat constipation: Drink enough fluids to keep your urine pale yellow. Take over-the-counter or prescription medicines. Limit foods that are high in fat and processed sugars, such as fried or sweet foods. Eating and drinking Follow instructions from your health care provider about what you may eat and drink. Do not drink alcohol if your health care provider tells you not to drink. Eat a low-fiber diet for the first 4 weeks after surgery or as told by your health care provider.. Most people on a low-fiber eating plan should eat less than 10 grams (g) of fiber a day. Follow recommendations from your health care provider or dietitian about how much fiber you should have each day. Always check food labels to know the fiber content of packaged foods. In general, a low-fiber food will have fewer than 2 g of fiber per serving. In general, try to avoid whole grains, raw fruits and vegetables, dried fruit, tough  cuts of meat, nuts, and seeds. Incision care  Follow instructions from your health care provider about how to take care of your incisions. Make sure you: Wash your hands with soap and water for at least 20 seconds before and after you change your bandage (dressing). If soap and water are not available, use hand sanitizer. Change your dressing as told by your health care provider. Leave stitches (sutures), skin glue, or adhesive strips in place. These skin closures may need to stay in place for 2 weeks or longer. If adhesive strip edges start to loosen and curl up, you may trim the loose edges. Do not remove adhesive strips completely unless your health care provider tells you to do that. Check your incision area every day for signs of infection. Check for: More redness, swelling, or pain. Fluid or blood. Warmth. Pus or a bad smell. Activity Rest as told by your health care provider. Avoid sitting for a long time without moving. Get up to take short walks every 1-2 hours. This is important to improve blood flow and breathing. Ask for help if you feel weak or unsteady. You may have to avoid lifting. Ask your health care provider how much you can safely lift. Return to your normal activities as told by your health care provider. Ask your health care provider what activities are safe for you. General instructions Do not use any products that contain nicotine or tobacco. These products include cigarettes, chewing tobacco, and vaping devices, such as e-cigarettes. If you need help quitting, ask your health care provider. Do not take baths, swim, or use a hot tub until your health care provider  approves. Ask your health care provider if you may take showers. You may only be allowed to take sponge baths. Wear compression stockings as told by your health care provider. These stockings help to prevent blood clots and reduce swelling in your legs. Keep all follow-up visits. This is important to monitor  healing and check for any complications. Contact a health care provider if: Medicine is not controlling your pain. You have chills or fever. You have any signs of infection in your incision areas. You have a persistent cough. You have nausea or vomiting. You develop a rash. You have not had a bowel movement in 3 days. Get help right away if: You have severe pain. Your incisions break open after sutures or staples have been removed. You are bleeding from your rectum or have blood in your stool. You have a warm, tender swelling in your leg. You have chest pain or trouble breathing. You have increased swelling in the abdomen. You feel light-headed or you faint. These symptoms may be an emergency. Get help right away. Call 911. Do not wait to see if the symptoms will go away. Do not drive yourself to the hospital. Summary After surgery, it is common to have some pain, bruising, swelling, bloating, tiredness, weakness, or changes to your bowel movements. Follow instructions from your health care provider about what to eat and drink. Return to your normal activities as told by your health care provider. Check your incision area every day for signs of infection. Get help right away if you have chest pain or trouble breathing. This information is not intended to replace advice given to you by your health care provider. Make sure you discuss any questions you have with your health care provider. Document Revised: 10/24/2021 Document Reviewed: 10/24/2021 Elsevier Patient Education  2024 [employer].

## 2024-08-12 NOTE — Telephone Encounter (Signed)
" ° °  Patient Name: [name]  DOB: 04-Dec-1945 MRN: [id number]  Primary Cardiologist: Jayson Sierras, MD  Chart reviewed as part of pre-operative protocol coverage. Pre-op clearance already addressed by colleagues in earlier phone notes. To summarize recommendations:  - Pre op recommendations to be discussed at in office visit 09/07/24.  - Regarding ASA therapy, we recommend continuation of ASA throughout the perioperative period. However, if the surgeon feels that cessation of ASA is required in the perioperative period, it may be stopped 5-7 days prior to surgery with a plan to resume it as soon as felt to be feasible from a surgical standpoint in the post-operative period.   Will route this bundled recommendation to requesting provider via Epic fax function and remove from pre-op pool. Please call with questions.  Mardy KATHEE Pizza, FNP 08/12/2024, 9:11 AM  "

## 2024-08-12 NOTE — Telephone Encounter (Signed)
 I will update all parties involved of upcoming appt 09/07/24 with [name], PAC. Pt is over due for her f/u appt which preop clearance will be reviewed at appt as well.

## 2024-08-13 NOTE — Progress Notes (Signed)
 Outpatient Surgical Follow Up    [name] is an 79 y.o. female.   Chief Complaint  Patient presents with   Follow-up    Colectomy with colostomy 02/24/24    YEP:Ipjwj is 5 months out  from hartmann's  's due to perf diverticulitis. recent chemo for breast CA, SHe si doing ok considering her fragility and chronic disease. Ostomy is working and tolerating PO , she is now at home. I talked to the care giver and the son over the phone . She just completed XRT  I had an extensive discussion with the patient and also the son.   She is still not at 100% and is quite debilitated.  .  Doing an ostomy reversal is a purely elective procedure.  She needs to improve from a physiologic perspective . I was very candid with them regarding expectations.  A potential complication from a colostomy reversal can be devastating potentially cause significant morbidity and mortality.  They have not seen cardiology which definitely needs to be addressed.  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   Breast cancer (HCC) 09/09/2023   Cancer (HCC)    Depression    Diverticulosis of colon    GERD (gastroesophageal reflux disease)    Hyperlipidemia    Hypertension    Obesity    Osteoarthritis of knees, bilateral    Personal history of chemotherapy 01/2024   Sleep apnea     Past Surgical History:  Procedure Laterality Date   ABDOMINAL HYSTERECTOMY     AXILLARY SENTINEL NODE BIOPSY Right 10/22/2023   Procedure: BIOPSY, LYMPH NODE, SENTINEL, AXILLARY;  Surgeon: Evonnie Dorothyann LABOR, DO;  Location: [facility];  Service: General;  Laterality: Right;   BREAST BIOPSY Right 09/09/2023   ductal adenocarcinoma/US  RT BREAST BX W LOC DEV 1ST LESION IMG BX SPEC US  GUIDE 09/09/2023 Lennon Nest, MD [facility]   BREAST LUMPECTOMY WITH RADIO FREQUENCY LOCALIZER Right 10/22/2023    Procedure: BREAST LUMPECTOMY WITH RADIO FREQUENCY LOCALIZER;  Surgeon: Evonnie Dorothyann LABOR, DO;  Location: [facility];  Service: General;  Laterality: Right;   COLECTOMY WITH COLOSTOMY CREATION/HARTMANN PROCEDURE N/A 02/24/2024   Procedure: COLECTOMY, WITH COLOSTOMY CREATION;  Surgeon: Jordis Laneta FALCON, MD;  Location: [facility];  Service: General;  Laterality: N/A;   COLONOSCOPY  07/23/2003   Dr. Keven Smith:numerous large scattered diverticula   COLONOSCOPY N/A 11/15/2013   Dr. Harvey: moderate diverticula, small internal hemorrhoids, redudant colon. Next TCS in 2025 with overtube.    ESOPHAGOGASTRODUODENOSCOPY (EGD) WITH ESOPHAGEAL DILATION N/A 11/15/2013   Dr. Harvey: stricture at GE junction s/p dilation. moderate erosive gastritis, negative H.pylori   MULTIPLE TOOTH EXTRACTIONS  2024   PORTACATH PLACEMENT Left 10/22/2023   Procedure: INSERTION, TUNNELED CENTRAL VENOUS DEVICE, WITH PORT;  Surgeon: Evonnie Dorothyann LABOR, DO;  Location: [facility];  Service: General;  Laterality: Left;   TOTAL KNEE ARTHROPLASTY  06/01/2004   left / Dr. Margrette   TOTAL KNEE ARTHROPLASTY  11/29/2003   right / Dr. Margrette   TUBAL LIGATION      Family History  Problem Relation Age of Onset   Diabetes Mother    Hypertension Mother    Heart disease Mother    Aneurysm Father    Diabetes Father    Hypertension Brother    Diabetes Brother    Diabetes Brother  Diabetes Brother    Hypertension Brother    Breast cancer Maternal Aunt        dx >50   Cancer Maternal Aunt        unknown type; ? breast; dx >50?   Prostate cancer Maternal Uncle    Hypertension Son     Social History:  reports that she has never smoked. She has never been exposed to tobacco smoke. She has never used smokeless tobacco. She reports that she does not drink alcohol and does not use drugs.  Allergies: Allergies[1]  Medications reviewed.    ROS Full ROS performed and is otherwise negative other than what is stated in  HPI   BP 135/79   Pulse 91   Ht 5' (1.524 m)   Wt 173 lb 9.6 oz (78.7 kg)   SpO2 98%   BMI 33.90 kg/m   Physical Exam   Physical Exam Vitals and nursing note reviewed. Exam conducted with a chaperone present. Debilitated, chronically ill Constitutional:      General: She is not in acute distress. Cardiovascular:     Rate and Rhythm: Normal rate and regular rhythm.     Heart sounds: No murmur heard. Pulmonary:     Effort: Pulmonary effort is normal. No respiratory distress.     Breath sounds: Normal breath sounds. No stridor. No wheezing or rhonchi.  Abdominal:     General: Abdomen is flat. There is no distension.     Palpations: Abdomen is soft. There is no mass.     Tenderness: There is no abdominal tenderness. There is no guarding or rebound. Ostomy in place, no rebound    Hernia: No hernia is present.  Musculoskeletal:        General: No swelling or tenderness. Normal range of motion.     Cervical back: Normal range of motion and neck supple. No rigidity or tenderness.  Skin:    General: Skin is warm and dry.     Capillary Refill: Capillary refill takes less than 2 seconds.  Neurological:     General: No focal deficit present.     Mental Status: She is alert and oriented to person, place, and time.  Psychiatric:        Mood and Affect: Mood normal.        Behavior: Behavior normal.        Thought Content: Thought content normal.        Judgment: Judgment normal.     Assessment/Plan: 79 year old female with Hartman's procedure 5 months ago she is pretty debilitated finish radiation therapy for breast cancer.  She still continues to have dyspnea on exertion and weakness.  She is not optimized for elective procedure.  I was very candid and honest with her and the son regarding expectations.  I am not sure if they fully understand the potential risk that are complication may carry if she develops 1.  I am not in a rush to reverse her and will like to obtain cardiology  evaluation.  I will be happy to see her in a couple months and revisit her ostomy reversal. I personally spent a total of 40 minutes in the care of the patient today including performing a medically appropriate exam/evaluation, counseling and educating, placing orders, referring and communicating with other health care professionals, documenting clinical information in the EHR, independently interpreting and reviewing images studies and coordinating care.   Laneta Luna, MD FACS General Surgeon     [1] No Known Allergies

## 2024-08-16 ENCOUNTER — Ambulatory Visit: Admitting: Surgery

## 2024-08-24 ENCOUNTER — Telehealth: Payer: Self-pay

## 2024-08-24 NOTE — Telephone Encounter (Signed)
 Received cardiac clearance from Dr. Jayson Sierras. He recommends continuation of ASA throughout the perioperative period. However, if the surgeon feel that ASA is required in the perioperative period, it may be stopped 5-7 days prior to surgery.  Notes are in Epic.

## 2024-08-25 ENCOUNTER — Other Ambulatory Visit: Payer: Self-pay

## 2024-09-07 ENCOUNTER — Ambulatory Visit: Admitting: Physician Assistant

## 2024-09-09 ENCOUNTER — Ambulatory Visit: Admitting: Radiation Oncology

## 2024-09-10 ENCOUNTER — Inpatient Hospital Stay

## 2024-09-10 ENCOUNTER — Inpatient Hospital Stay: Attending: Hematology | Admitting: Oncology

## 2024-10-11 ENCOUNTER — Ambulatory Visit: Admitting: Surgery

## 2025-07-19 ENCOUNTER — Other Ambulatory Visit (HOSPITAL_COMMUNITY)

## 2025-07-19 ENCOUNTER — Encounter (HOSPITAL_COMMUNITY)
